# Patient Record
Sex: Female | Born: 1945 | Race: Black or African American | Hispanic: No | Smoking: Light tobacco smoker
Health system: Southern US, Community
[De-identification: ages and names within clinical notes are randomized; demographics above are authoritative.]

## PROBLEM LIST (undated history)

## (undated) DIAGNOSIS — E079 Disorder of thyroid, unspecified: Secondary | ICD-10-CM

## (undated) DIAGNOSIS — E064 Drug-induced thyroiditis: Secondary | ICD-10-CM

## (undated) DIAGNOSIS — Z9889 Other specified postprocedural states: Secondary | ICD-10-CM

## (undated) DIAGNOSIS — Z8719 Personal history of other diseases of the digestive system: Secondary | ICD-10-CM

## (undated) DIAGNOSIS — N19 Unspecified kidney failure: Secondary | ICD-10-CM

## (undated) DIAGNOSIS — H269 Unspecified cataract: Secondary | ICD-10-CM

## (undated) DIAGNOSIS — I509 Heart failure, unspecified: Secondary | ICD-10-CM

## (undated) DIAGNOSIS — I10 Essential (primary) hypertension: Secondary | ICD-10-CM

## (undated) DIAGNOSIS — IMO0001 Reserved for inherently not codable concepts without codable children: Secondary | ICD-10-CM

## (undated) DIAGNOSIS — I739 Peripheral vascular disease, unspecified: Secondary | ICD-10-CM

## (undated) DIAGNOSIS — T7840XA Allergy, unspecified, initial encounter: Secondary | ICD-10-CM

## (undated) DIAGNOSIS — G4730 Sleep apnea, unspecified: Secondary | ICD-10-CM

## (undated) DIAGNOSIS — I428 Other cardiomyopathies: Secondary | ICD-10-CM

## (undated) DIAGNOSIS — E890 Postprocedural hypothyroidism: Secondary | ICD-10-CM

## (undated) DIAGNOSIS — Z8639 Personal history of other endocrine, nutritional and metabolic disease: Secondary | ICD-10-CM

## (undated) DIAGNOSIS — M1990 Unspecified osteoarthritis, unspecified site: Secondary | ICD-10-CM

## (undated) DIAGNOSIS — Z923 Personal history of irradiation: Secondary | ICD-10-CM

## (undated) DIAGNOSIS — E119 Type 2 diabetes mellitus without complications: Secondary | ICD-10-CM

## (undated) DIAGNOSIS — K219 Gastro-esophageal reflux disease without esophagitis: Secondary | ICD-10-CM

## (undated) DIAGNOSIS — E785 Hyperlipidemia, unspecified: Secondary | ICD-10-CM

## (undated) DIAGNOSIS — Z95810 Presence of automatic (implantable) cardiac defibrillator: Secondary | ICD-10-CM

## (undated) HISTORY — PX: CATARACT EXTRACTION: SUR2

## (undated) HISTORY — PX: THYROIDECTOMY: SHX17

## (undated) HISTORY — DX: Essential (primary) hypertension: I10

## (undated) HISTORY — DX: Heart failure, unspecified: I50.9

## (undated) HISTORY — DX: Hyperlipidemia, unspecified: E78.5

## (undated) HISTORY — DX: Sleep apnea, unspecified: G47.30

## (undated) HISTORY — PX: TOTAL ABDOMINAL HYSTERECTOMY: SHX209

## (undated) HISTORY — DX: Personal history of other endocrine, nutritional and metabolic disease: Z86.39

## (undated) HISTORY — DX: Personal history of irradiation: Z92.3

## (undated) HISTORY — DX: Disorder of thyroid, unspecified: E07.9

## (undated) HISTORY — DX: Drug-induced thyroiditis: E06.4

## (undated) HISTORY — DX: Other specified postprocedural states: Z98.890

## (undated) HISTORY — DX: Unspecified osteoarthritis, unspecified site: M19.90

## (undated) HISTORY — DX: Gastro-esophageal reflux disease without esophagitis: K21.9

## (undated) HISTORY — PX: EYE SURGERY: SHX253

## (undated) HISTORY — DX: Peripheral vascular disease, unspecified: I73.9

## (undated) HISTORY — DX: Type 2 diabetes mellitus without complications: E11.9

## (undated) HISTORY — DX: Allergy, unspecified, initial encounter: T78.40XA

## (undated) HISTORY — DX: Unspecified kidney failure: N19

## (undated) HISTORY — DX: Postprocedural hypothyroidism: E89.0

## (undated) HISTORY — PX: TONSILLECTOMY: SHX5217

## (undated) HISTORY — DX: Other cardiomyopathies: I42.8

## (undated) HISTORY — DX: Unspecified cataract: H26.9

---

## 1997-10-29 ENCOUNTER — Ambulatory Visit (HOSPITAL_COMMUNITY): Admission: RE | Admit: 1997-10-29 | Discharge: 1997-10-29 | Payer: Self-pay | Admitting: Podiatry

## 1997-11-02 ENCOUNTER — Ambulatory Visit (HOSPITAL_COMMUNITY): Admission: RE | Admit: 1997-11-02 | Discharge: 1997-11-02 | Payer: Self-pay | Admitting: Podiatry

## 1997-11-04 ENCOUNTER — Other Ambulatory Visit: Admission: RE | Admit: 1997-11-04 | Discharge: 1997-11-04 | Payer: Self-pay | Admitting: Podiatry

## 1998-05-13 ENCOUNTER — Ambulatory Visit (HOSPITAL_COMMUNITY): Admission: RE | Admit: 1998-05-13 | Discharge: 1998-05-13 | Payer: Self-pay | Admitting: Endocrinology

## 1998-05-13 ENCOUNTER — Encounter: Payer: Self-pay | Admitting: Endocrinology

## 1998-07-09 ENCOUNTER — Emergency Department (HOSPITAL_COMMUNITY): Admission: EM | Admit: 1998-07-09 | Discharge: 1998-07-09 | Payer: Self-pay | Admitting: Emergency Medicine

## 1998-07-09 ENCOUNTER — Encounter: Payer: Self-pay | Admitting: Emergency Medicine

## 1999-05-16 ENCOUNTER — Ambulatory Visit (HOSPITAL_COMMUNITY): Admission: RE | Admit: 1999-05-16 | Discharge: 1999-05-16 | Payer: Self-pay | Admitting: Endocrinology

## 1999-05-16 ENCOUNTER — Encounter: Payer: Self-pay | Admitting: Endocrinology

## 1999-08-16 ENCOUNTER — Emergency Department (HOSPITAL_COMMUNITY): Admission: EM | Admit: 1999-08-16 | Discharge: 1999-08-16 | Payer: Self-pay | Admitting: Emergency Medicine

## 1999-10-10 ENCOUNTER — Ambulatory Visit (HOSPITAL_COMMUNITY): Admission: RE | Admit: 1999-10-10 | Discharge: 1999-10-10 | Payer: Self-pay | Admitting: Internal Medicine

## 1999-10-10 ENCOUNTER — Encounter: Payer: Self-pay | Admitting: Internal Medicine

## 1999-10-11 ENCOUNTER — Encounter: Payer: Self-pay | Admitting: Internal Medicine

## 1999-10-19 ENCOUNTER — Encounter (INDEPENDENT_AMBULATORY_CARE_PROVIDER_SITE_OTHER): Payer: Self-pay | Admitting: Specialist

## 1999-10-19 ENCOUNTER — Ambulatory Visit (HOSPITAL_COMMUNITY): Admission: RE | Admit: 1999-10-19 | Discharge: 1999-10-19 | Payer: Self-pay | Admitting: Internal Medicine

## 1999-10-19 ENCOUNTER — Encounter: Payer: Self-pay | Admitting: Internal Medicine

## 2000-07-04 ENCOUNTER — Encounter: Payer: Self-pay | Admitting: Endocrinology

## 2000-07-04 ENCOUNTER — Ambulatory Visit (HOSPITAL_COMMUNITY): Admission: RE | Admit: 2000-07-04 | Discharge: 2000-07-04 | Payer: Self-pay | Admitting: Endocrinology

## 2000-07-05 ENCOUNTER — Encounter: Admission: RE | Admit: 2000-07-05 | Discharge: 2000-07-05 | Payer: Self-pay | Admitting: Internal Medicine

## 2000-07-05 ENCOUNTER — Encounter: Payer: Self-pay | Admitting: Internal Medicine

## 2001-03-16 ENCOUNTER — Emergency Department (HOSPITAL_COMMUNITY): Admission: EM | Admit: 2001-03-16 | Discharge: 2001-03-16 | Payer: Self-pay | Admitting: Emergency Medicine

## 2001-03-20 ENCOUNTER — Ambulatory Visit (HOSPITAL_COMMUNITY): Admission: EM | Admit: 2001-03-20 | Discharge: 2001-03-21 | Payer: Self-pay | Admitting: Emergency Medicine

## 2001-03-20 ENCOUNTER — Encounter: Payer: Self-pay | Admitting: *Deleted

## 2001-07-15 ENCOUNTER — Encounter (INDEPENDENT_AMBULATORY_CARE_PROVIDER_SITE_OTHER): Payer: Self-pay | Admitting: Specialist

## 2001-07-15 ENCOUNTER — Ambulatory Visit (HOSPITAL_COMMUNITY): Admission: RE | Admit: 2001-07-15 | Discharge: 2001-07-15 | Payer: Self-pay | Admitting: Gastroenterology

## 2001-07-17 ENCOUNTER — Encounter: Admission: RE | Admit: 2001-07-17 | Discharge: 2001-07-17 | Payer: Self-pay | Admitting: Internal Medicine

## 2001-07-17 ENCOUNTER — Encounter: Payer: Self-pay | Admitting: Internal Medicine

## 2001-08-08 ENCOUNTER — Emergency Department (HOSPITAL_COMMUNITY): Admission: EM | Admit: 2001-08-08 | Discharge: 2001-08-08 | Payer: Self-pay | Admitting: Emergency Medicine

## 2002-01-08 ENCOUNTER — Encounter: Payer: Self-pay | Admitting: Internal Medicine

## 2002-01-08 ENCOUNTER — Ambulatory Visit (HOSPITAL_COMMUNITY): Admission: RE | Admit: 2002-01-08 | Discharge: 2002-01-08 | Payer: Self-pay | Admitting: Endocrinology

## 2002-07-16 ENCOUNTER — Encounter: Payer: Self-pay | Admitting: Internal Medicine

## 2002-07-16 ENCOUNTER — Encounter (HOSPITAL_COMMUNITY): Admission: RE | Admit: 2002-07-16 | Discharge: 2002-10-14 | Payer: Self-pay | Admitting: Internal Medicine

## 2002-08-31 ENCOUNTER — Emergency Department (HOSPITAL_COMMUNITY): Admission: EM | Admit: 2002-08-31 | Discharge: 2002-08-31 | Payer: Self-pay | Admitting: Emergency Medicine

## 2003-07-04 ENCOUNTER — Emergency Department (HOSPITAL_COMMUNITY): Admission: EM | Admit: 2003-07-04 | Discharge: 2003-07-04 | Payer: Self-pay | Admitting: Emergency Medicine

## 2003-07-22 ENCOUNTER — Encounter (HOSPITAL_COMMUNITY): Admission: RE | Admit: 2003-07-22 | Discharge: 2003-10-20 | Payer: Self-pay | Admitting: Internal Medicine

## 2003-09-01 ENCOUNTER — Encounter: Admission: RE | Admit: 2003-09-01 | Discharge: 2003-09-01 | Payer: Self-pay | Admitting: Internal Medicine

## 2004-07-14 ENCOUNTER — Ambulatory Visit (HOSPITAL_COMMUNITY): Admission: RE | Admit: 2004-07-14 | Discharge: 2004-07-14 | Payer: Self-pay | Admitting: Internal Medicine

## 2005-04-10 ENCOUNTER — Encounter: Payer: Self-pay | Admitting: Internal Medicine

## 2005-05-04 ENCOUNTER — Encounter: Admission: RE | Admit: 2005-05-04 | Discharge: 2005-05-04 | Payer: Self-pay | Admitting: Internal Medicine

## 2006-05-28 LAB — CONVERTED CEMR LAB: Pap Smear: NORMAL

## 2007-01-28 ENCOUNTER — Emergency Department (HOSPITAL_COMMUNITY): Admission: EM | Admit: 2007-01-28 | Discharge: 2007-01-28 | Payer: Self-pay | Admitting: Emergency Medicine

## 2007-01-31 ENCOUNTER — Encounter: Payer: Self-pay | Admitting: Internal Medicine

## 2007-01-31 ENCOUNTER — Ambulatory Visit: Payer: Self-pay | Admitting: Internal Medicine

## 2007-01-31 DIAGNOSIS — K209 Esophagitis, unspecified without bleeding: Secondary | ICD-10-CM | POA: Insufficient documentation

## 2007-01-31 DIAGNOSIS — I319 Disease of pericardium, unspecified: Secondary | ICD-10-CM | POA: Insufficient documentation

## 2007-01-31 DIAGNOSIS — I10 Essential (primary) hypertension: Secondary | ICD-10-CM | POA: Insufficient documentation

## 2007-01-31 DIAGNOSIS — E039 Hypothyroidism, unspecified: Secondary | ICD-10-CM | POA: Insufficient documentation

## 2007-02-01 ENCOUNTER — Encounter: Payer: Self-pay | Admitting: Internal Medicine

## 2007-02-01 ENCOUNTER — Ambulatory Visit: Payer: Self-pay | Admitting: Internal Medicine

## 2007-02-01 ENCOUNTER — Ambulatory Visit: Payer: Self-pay

## 2007-02-04 ENCOUNTER — Encounter: Payer: Self-pay | Admitting: Internal Medicine

## 2007-02-06 ENCOUNTER — Ambulatory Visit: Payer: Self-pay | Admitting: Cardiology

## 2007-02-14 ENCOUNTER — Ambulatory Visit: Payer: Self-pay

## 2007-02-14 ENCOUNTER — Encounter: Payer: Self-pay | Admitting: Internal Medicine

## 2007-02-22 ENCOUNTER — Telehealth: Payer: Self-pay | Admitting: Internal Medicine

## 2007-03-11 ENCOUNTER — Encounter: Payer: Self-pay | Admitting: Internal Medicine

## 2007-03-12 ENCOUNTER — Ambulatory Visit: Payer: Self-pay | Admitting: Internal Medicine

## 2007-03-12 DIAGNOSIS — I428 Other cardiomyopathies: Secondary | ICD-10-CM | POA: Insufficient documentation

## 2007-03-15 LAB — CONVERTED CEMR LAB
BUN: 17 mg/dL (ref 6–23)
Chloride: 102 meq/L (ref 96–112)
Creatinine, Ser: 1.3 mg/dL — ABNORMAL HIGH (ref 0.4–1.2)
Sodium: 137 meq/L (ref 135–145)
TSH: 66.44 microintl units/mL — ABNORMAL HIGH (ref 0.35–5.50)
Vit D, 1,25-Dihydroxy: 14 — ABNORMAL LOW (ref 30–89)

## 2007-03-19 ENCOUNTER — Ambulatory Visit: Payer: Self-pay | Admitting: Cardiology

## 2007-04-10 ENCOUNTER — Ambulatory Visit: Payer: Self-pay

## 2007-04-10 ENCOUNTER — Encounter: Payer: Self-pay | Admitting: Cardiology

## 2007-05-05 DIAGNOSIS — Z9889 Other specified postprocedural states: Secondary | ICD-10-CM

## 2007-05-05 HISTORY — DX: Other specified postprocedural states: Z98.890

## 2007-05-07 ENCOUNTER — Ambulatory Visit: Payer: Self-pay | Admitting: Internal Medicine

## 2007-05-07 LAB — CONVERTED CEMR LAB
AST: 14 units/L (ref 0–37)
Albumin: 3.5 g/dL (ref 3.5–5.2)
Alkaline Phosphatase: 85 units/L (ref 39–117)
BUN: 15 mg/dL (ref 6–23)
Chloride: 102 meq/L (ref 96–112)
Direct LDL: 164.1 mg/dL
GFR calc non Af Amer: 53 mL/min
Potassium: 3.9 meq/L (ref 3.5–5.1)
Sodium: 138 meq/L (ref 135–145)
TSH: 24.71 microintl units/mL — ABNORMAL HIGH (ref 0.35–5.50)
Total CHOL/HDL Ratio: 4.7
VLDL: 23 mg/dL (ref 0–40)

## 2007-05-08 ENCOUNTER — Ambulatory Visit: Payer: Self-pay | Admitting: Cardiology

## 2007-05-09 ENCOUNTER — Ambulatory Visit: Payer: Self-pay | Admitting: Cardiology

## 2007-05-09 LAB — CONVERTED CEMR LAB
Basophils Relative: 0.7 % (ref 0.0–1.0)
Eosinophils Absolute: 0 10*3/uL (ref 0.0–0.6)
Eosinophils Relative: 0.6 % (ref 0.0–5.0)
Hemoglobin: 12.1 g/dL (ref 12.0–15.0)
Lymphocytes Relative: 33.3 % (ref 12.0–46.0)
MCV: 91 fL (ref 78.0–100.0)
Monocytes Absolute: 0.4 10*3/uL (ref 0.2–0.7)
Neutro Abs: 4.4 10*3/uL (ref 1.4–7.7)
Neutrophils Relative %: 59.5 % (ref 43.0–77.0)
Platelets: 382 10*3/uL (ref 150–400)
Prothrombin Time: 11.1 s (ref 10.9–13.3)
WBC: 7.3 10*3/uL (ref 4.5–10.5)

## 2007-05-13 ENCOUNTER — Inpatient Hospital Stay (HOSPITAL_BASED_OUTPATIENT_CLINIC_OR_DEPARTMENT_OTHER): Admission: RE | Admit: 2007-05-13 | Discharge: 2007-05-13 | Payer: Self-pay | Admitting: Cardiology

## 2007-05-13 ENCOUNTER — Encounter: Payer: Self-pay | Admitting: Internal Medicine

## 2007-05-13 ENCOUNTER — Ambulatory Visit: Payer: Self-pay | Admitting: Cardiology

## 2007-05-16 ENCOUNTER — Ambulatory Visit: Payer: Self-pay | Admitting: Internal Medicine

## 2007-05-16 DIAGNOSIS — E785 Hyperlipidemia, unspecified: Secondary | ICD-10-CM

## 2007-05-23 ENCOUNTER — Ambulatory Visit: Payer: Self-pay | Admitting: Cardiology

## 2007-09-02 ENCOUNTER — Ambulatory Visit: Payer: Self-pay | Admitting: Internal Medicine

## 2007-09-02 LAB — CONVERTED CEMR LAB
Cholesterol: 208 mg/dL (ref 0–200)
Total CHOL/HDL Ratio: 4.1
Triglycerides: 145 mg/dL (ref 0–149)
VLDL: 29 mg/dL (ref 0–40)

## 2007-09-05 ENCOUNTER — Ambulatory Visit: Payer: Self-pay | Admitting: Internal Medicine

## 2007-09-05 DIAGNOSIS — M67919 Unspecified disorder of synovium and tendon, unspecified shoulder: Secondary | ICD-10-CM | POA: Insufficient documentation

## 2007-09-05 DIAGNOSIS — M719 Bursopathy, unspecified: Secondary | ICD-10-CM

## 2007-11-20 ENCOUNTER — Telehealth: Payer: Self-pay | Admitting: Internal Medicine

## 2007-11-20 ENCOUNTER — Ambulatory Visit: Payer: Self-pay | Admitting: Internal Medicine

## 2007-11-26 ENCOUNTER — Encounter: Admission: RE | Admit: 2007-11-26 | Discharge: 2007-11-26 | Payer: Self-pay | Admitting: Internal Medicine

## 2007-11-28 ENCOUNTER — Ambulatory Visit: Payer: Self-pay | Admitting: Internal Medicine

## 2008-02-18 ENCOUNTER — Ambulatory Visit: Payer: Self-pay | Admitting: Internal Medicine

## 2008-02-18 LAB — CONVERTED CEMR LAB
AST: 15 units/L (ref 0–37)
CO2: 24 meq/L (ref 19–32)
Calcium: 9.4 mg/dL (ref 8.4–10.5)
Creatinine, Ser: 1.1 mg/dL (ref 0.4–1.2)
GFR calc non Af Amer: 53 mL/min
HDL: 60.2 mg/dL (ref >39.0)
Total CHOL/HDL Ratio: 3.6
Triglycerides: 86 mg/dL (ref 0–149)
VLDL: 17 mg/dL (ref 0–40)

## 2008-02-24 ENCOUNTER — Ambulatory Visit: Payer: Self-pay | Admitting: Internal Medicine

## 2008-03-10 ENCOUNTER — Ambulatory Visit: Payer: Self-pay | Admitting: Internal Medicine

## 2008-03-24 ENCOUNTER — Telehealth: Payer: Self-pay | Admitting: Internal Medicine

## 2008-06-01 ENCOUNTER — Telehealth (INDEPENDENT_AMBULATORY_CARE_PROVIDER_SITE_OTHER): Payer: Self-pay | Admitting: *Deleted

## 2008-09-28 ENCOUNTER — Telehealth: Payer: Self-pay | Admitting: Internal Medicine

## 2008-10-22 ENCOUNTER — Ambulatory Visit: Payer: Self-pay | Admitting: Internal Medicine

## 2008-10-22 LAB — CONVERTED CEMR LAB
Cholesterol: 203 mg/dL — ABNORMAL HIGH (ref 0–200)
Direct LDL: 122.6 mg/dL
Total CHOL/HDL Ratio: 4
Triglycerides: 133 mg/dL (ref 0.0–149.0)
VLDL: 26.6 mg/dL (ref 0.0–40.0)

## 2008-10-29 ENCOUNTER — Ambulatory Visit: Payer: Self-pay | Admitting: Internal Medicine

## 2008-10-29 DIAGNOSIS — F17200 Nicotine dependence, unspecified, uncomplicated: Secondary | ICD-10-CM

## 2008-10-29 DIAGNOSIS — E119 Type 2 diabetes mellitus without complications: Secondary | ICD-10-CM

## 2008-10-29 DIAGNOSIS — R252 Cramp and spasm: Secondary | ICD-10-CM

## 2008-10-29 LAB — CONVERTED CEMR LAB
HDL goal, serum: 40 mg/dL
LDL Goal: 70 mg/dL

## 2008-11-04 ENCOUNTER — Ambulatory Visit: Payer: Self-pay

## 2008-11-04 ENCOUNTER — Encounter: Payer: Self-pay | Admitting: Internal Medicine

## 2008-11-05 ENCOUNTER — Telehealth: Payer: Self-pay | Admitting: Internal Medicine

## 2008-11-17 ENCOUNTER — Encounter: Payer: Self-pay | Admitting: Internal Medicine

## 2008-11-20 ENCOUNTER — Encounter: Payer: Self-pay | Admitting: Internal Medicine

## 2008-11-23 ENCOUNTER — Telehealth: Payer: Self-pay | Admitting: Internal Medicine

## 2008-11-24 ENCOUNTER — Encounter: Payer: Self-pay | Admitting: Internal Medicine

## 2008-11-30 ENCOUNTER — Telehealth: Payer: Self-pay | Admitting: Internal Medicine

## 2008-12-04 ENCOUNTER — Ambulatory Visit: Payer: Self-pay | Admitting: Cardiovascular Disease

## 2008-12-04 ENCOUNTER — Encounter: Payer: Self-pay | Admitting: Internal Medicine

## 2008-12-07 DIAGNOSIS — I70219 Atherosclerosis of native arteries of extremities with intermittent claudication, unspecified extremity: Secondary | ICD-10-CM

## 2008-12-21 ENCOUNTER — Telehealth: Payer: Self-pay | Admitting: Internal Medicine

## 2008-12-21 ENCOUNTER — Telehealth: Payer: Self-pay | Admitting: Cardiovascular Disease

## 2008-12-31 ENCOUNTER — Ambulatory Visit: Payer: Self-pay | Admitting: Internal Medicine

## 2009-03-05 ENCOUNTER — Telehealth: Payer: Self-pay | Admitting: Internal Medicine

## 2009-03-19 ENCOUNTER — Ambulatory Visit: Payer: Self-pay | Admitting: Cardiovascular Disease

## 2009-03-19 DIAGNOSIS — I5022 Chronic systolic (congestive) heart failure: Secondary | ICD-10-CM

## 2009-04-07 ENCOUNTER — Ambulatory Visit: Payer: Self-pay | Admitting: Internal Medicine

## 2009-04-07 LAB — CONVERTED CEMR LAB
ALT: 14 units/L (ref 0–35)
Calcium: 9.8 mg/dL (ref 8.4–10.5)
Direct LDL: 188.2 mg/dL
GFR calc non Af Amer: 63.97 mL/min (ref >60)
Glucose, Bld: 109 mg/dL — ABNORMAL HIGH (ref 70–99)
HDL: 51 mg/dL (ref >39.00)
Sodium: 142 meq/L (ref 135–145)

## 2009-04-12 ENCOUNTER — Ambulatory Visit: Payer: Self-pay | Admitting: Internal Medicine

## 2009-04-22 ENCOUNTER — Ambulatory Visit: Payer: Self-pay

## 2009-04-22 ENCOUNTER — Encounter: Payer: Self-pay | Admitting: Cardiovascular Disease

## 2009-04-22 ENCOUNTER — Ambulatory Visit (HOSPITAL_COMMUNITY): Admission: RE | Admit: 2009-04-22 | Discharge: 2009-04-22 | Payer: Self-pay | Admitting: Cardiovascular Disease

## 2009-04-22 ENCOUNTER — Ambulatory Visit: Payer: Self-pay | Admitting: Cardiovascular Disease

## 2009-07-26 ENCOUNTER — Ambulatory Visit: Payer: Self-pay | Admitting: Internal Medicine

## 2009-07-26 LAB — CONVERTED CEMR LAB
BUN: 12 mg/dL (ref 6–23)
Calcium: 9.4 mg/dL (ref 8.4–10.5)
Chloride: 102 meq/L (ref 96–112)
Creatinine, Ser: 1.1 mg/dL (ref 0.4–1.2)
Direct LDL: 122.9 mg/dL
GFR calc non Af Amer: 63.91 mL/min (ref >60)
HDL: 57.5 mg/dL (ref >39.00)
Hgb A1c MFr Bld: 6.9 % — ABNORMAL HIGH (ref 4.6–6.5)
Potassium: 4 meq/L (ref 3.5–5.1)
Sodium: 141 meq/L (ref 135–145)
TSH: 9.03 microintl units/mL — ABNORMAL HIGH (ref 0.35–5.50)

## 2009-08-02 ENCOUNTER — Encounter: Payer: Self-pay | Admitting: Cardiovascular Disease

## 2009-08-02 ENCOUNTER — Ambulatory Visit: Payer: Self-pay | Admitting: Internal Medicine

## 2009-09-07 ENCOUNTER — Encounter: Payer: Self-pay | Admitting: Internal Medicine

## 2009-10-15 ENCOUNTER — Ambulatory Visit: Payer: Self-pay | Admitting: Internal Medicine

## 2009-11-01 ENCOUNTER — Ambulatory Visit: Payer: Self-pay | Admitting: Internal Medicine

## 2009-11-23 ENCOUNTER — Ambulatory Visit: Payer: Self-pay | Admitting: Cardiovascular Disease

## 2010-01-03 ENCOUNTER — Ambulatory Visit (HOSPITAL_BASED_OUTPATIENT_CLINIC_OR_DEPARTMENT_OTHER): Admission: RE | Admit: 2010-01-03 | Discharge: 2010-01-03 | Payer: Self-pay | Admitting: Internal Medicine

## 2010-01-03 ENCOUNTER — Ambulatory Visit: Payer: Self-pay | Admitting: Internal Medicine

## 2010-01-03 ENCOUNTER — Ambulatory Visit: Payer: Self-pay | Admitting: Diagnostic Radiology

## 2010-01-03 DIAGNOSIS — R059 Cough, unspecified: Secondary | ICD-10-CM | POA: Insufficient documentation

## 2010-01-03 DIAGNOSIS — R05 Cough: Secondary | ICD-10-CM

## 2010-01-04 ENCOUNTER — Encounter: Payer: Self-pay | Admitting: Internal Medicine

## 2010-01-07 ENCOUNTER — Ambulatory Visit (HOSPITAL_BASED_OUTPATIENT_CLINIC_OR_DEPARTMENT_OTHER): Admission: RE | Admit: 2010-01-07 | Discharge: 2010-01-07 | Payer: Self-pay | Admitting: Internal Medicine

## 2010-01-07 ENCOUNTER — Ambulatory Visit: Payer: Self-pay | Admitting: Diagnostic Radiology

## 2010-01-11 ENCOUNTER — Telehealth: Payer: Self-pay | Admitting: Internal Medicine

## 2010-01-12 ENCOUNTER — Encounter: Payer: Self-pay | Admitting: Internal Medicine

## 2010-03-14 ENCOUNTER — Telehealth: Payer: Self-pay | Admitting: Internal Medicine

## 2010-04-20 ENCOUNTER — Encounter: Payer: Self-pay | Admitting: Internal Medicine

## 2010-04-20 ENCOUNTER — Ambulatory Visit
Admission: RE | Admit: 2010-04-20 | Discharge: 2010-04-20 | Payer: Self-pay | Source: Home / Self Care | Attending: Internal Medicine | Admitting: Internal Medicine

## 2010-04-20 DIAGNOSIS — E1142 Type 2 diabetes mellitus with diabetic polyneuropathy: Secondary | ICD-10-CM | POA: Insufficient documentation

## 2010-04-20 LAB — CONVERTED CEMR LAB
Albumin: 4.3 g/dL (ref 3.5–5.2)
BUN: 17 mg/dL (ref 6–23)
CO2: 26 meq/L (ref 19–32)
Chloride: 103 meq/L (ref 96–112)
Creatinine, Ser: 0.99 mg/dL (ref 0.40–1.20)
Creatinine, Urine: 85.3 mg/dL
Hgb A1c MFr Bld: 7.3 % — ABNORMAL HIGH (ref <5.7)
Indirect Bilirubin: 0.5 mg/dL (ref 0.0–0.9)
LDL Cholesterol: 137 mg/dL — ABNORMAL HIGH (ref 0–99)
Potassium: 4.2 meq/L (ref 3.5–5.3)
Total Bilirubin: 0.6 mg/dL (ref 0.3–1.2)
Total Protein: 7.8 g/dL (ref 6.0–8.3)
Triglycerides: 152 mg/dL — ABNORMAL HIGH (ref <150)
VLDL: 30 mg/dL (ref 0–40)

## 2010-04-22 ENCOUNTER — Telehealth: Payer: Self-pay | Admitting: Internal Medicine

## 2010-04-23 ENCOUNTER — Encounter: Payer: Self-pay | Admitting: Internal Medicine

## 2010-05-05 NOTE — Medication Information (Signed)
Summary: Low Adherence with Cholesterol Med/[facility]  Low Adherence with Cholesterol Med/[facility]   Imported By: Edmonia James 03/15/2010 12:58:31  _____________________________________________________________________  External Attachment:    Type:   Image     Comment:   External Document

## 2010-05-05 NOTE — Medication Information (Signed)
Summary: Letter Regarding Polypharmacy in Seniors & Cil[facility]  Letter Regarding Polypharmacy in [facility]   Imported By: Edmonia James 10/21/2009 10:16:59  _____________________________________________________________________  External Attachment:    Type:   Image     Comment:   External Document

## 2010-05-05 NOTE — Progress Notes (Signed)
Summary: Lab Results  Phone Note Outgoing Call   Summary of Call: call pt - recent blood test shows pt severely hypothyroid.  has she been taking her thyroid medication regularly  (plz call her pharm to find out when thyroid medication last refilled) Initial call taken by: D. [name] DO,  January 11, 2010 1:56 PM  Follow-up for Phone Call        call placed to [facility] with pharmacist  Mortimer Fries, he states patients last refill was 09/21/2009, and prior to that she had a 19 lapse in refills. He states the medication has been filled sporadically.  call placed to patient at [phone], patient states that she has been taking her medication on a regular basis. Follow-up by: Jiles Garter CMA,  January 11, 2010 2:34 PM  Additional Follow-up for Phone Call Additional follow up Details #1::        call pt - It is very important she take her thyroid medication regularly Blood tests shows she has been w/o medication for a whille when she restarts thyroid medication. pt needs to restart taking 1/2 of 125 mg x 2 weeks,  then 1 tab x 2 weeks, then 1.5 tabs x 2 weeks,  then 2 tabs by mouth qd Additional Follow-up by: D. [name] DO,  January 12, 2010 11:44 AM    Additional Follow-up for Phone Call Additional follow up Details #2::    call returned to patient she has been advised per Dr Shawna Orleans instructions. She stated she needed a new rx sent to pharmacy.  She was informed rx will be sent.  Follow-up by: Jiles Garter CMA,  January 12, 2010 12:03 PM  Prescriptions: LEVOTHYROXINE SODIUM 125 MCG TABS (LEVOTHYROXINE SODIUM) 2 tabs by mouth qd  #60 x 2   Entered by:   Jiles Garter CMA   Authorized by:   D. [name] DO   Signed by:   Jiles Garter CMA on 01/12/2010   Method used:   Electronically to        [facility] (retail)       [place], [place]  [id number]       Ph: [phone]       Fax: [phone]   RxID:   [id number]

## 2010-05-05 NOTE — Assessment & Plan Note (Signed)
Summary: COLD/COUGH/HX OF PNEUMONIA/HEA   Vital Signs:  Patient profile:   65 year old female Height:      71 inches Weight:      227.25 pounds BMI:     31.81 O2 Sat:      98 % on Room air Temp:     97.5 degrees F oral Pulse rate:   63 / minute Pulse rhythm:   irregular Resp:     14 per minute BP sitting:   140 / 90  (left arm) Cuff size:   large  Vitals Entered By: Jiles Garter CMA (January 03, 2010 11:25 AM)  O2 Flow:  Room air CC: URI Is Patient Diabetic? No Pain Assessment Patient in pain? no      Comments onset week ago, head congestion,  throat irritation, ache in arms and legs, chest congestion, productive cough yellow in color, diarrhea after anything she eats, she denies temperature, but has been sweating, increase in  appetite   Primary Care Provider:  Jennings Books DO  CC:  URI.  History of Present Illness: 65 y/o female c/o URI symptoms started with sneezing, sore throat onset 2 weeks coughing with yellowish sputum (settling in her throat) feeling some SOB and achy   Allergies: 1)  ! 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  The patient denies dyspnea on exertion.    Physical Exam  General:  alert, well-developed, and well-nourished.   Head:  normocephalic and atraumatic.   Ears:  R ear normal and L ear normal.   Mouth:  pharynx pink and moist.   Neck:  supple and no masses.   no carotid bruits.   Lungs:  normal respiratory effort and faint scattered exp wheeze Heart:  normal rate, regular rhythm, and no gallop.   Extremities:  trace left pedal edema and trace right pedal edema.   Psych:  normally interactive, good eye contact, not anxious appearing, and not depressed appearing.     Impression & Recommendations:  Problem # 1:  COUGH (ICD-786.2) pt with bronchitis.  cxr neg for acute airspace dz take abx as directed. Patient advised to call office if symptoms persist or worsen.  Orders: T-2 View CXR, Same Day ([id number])  Problem # 2:  DIABETES MELLITUS, TYPE II (ICD-250.00) Assessment: Unchanged Pt counseled on diet and exercise.  The following medications were removed from the medication list:    Lisinopril-hydrochlorothiazide 20-12.5 Mg Tabs (Lisinopril-hydrochlorothiazide) .Marland Kitchen... Take 1 tablet by mouth once a day Her updated medication list for this problem includes:    [facility] Aspirin 81 Mg Tbec (Aspirin) .Marland Kitchen... Take 1 tablet by mouth once a day    Metformin Hcl 500 Mg Tabs (Metformin hcl) ..... One by mouth two times a day    Losartan Potassium-hctz 100-25 Mg Tabs (Losartan potassium-hctz) ..... One by mouth once daily  Orders: T- Hemoglobin A1C [id number]) T-Urine  Microalbumin w/creat. ratio [id number])  Labs Reviewed: Creat: 1.1 (07/26/2009)    Reviewed HgBA1c results: 6.9 (07/26/2009)  6.6 (04/07/2009)  Problem # 3:  HYPERTENSION (ICD-401.9) Assessment: Deteriorated change ACE to ARB.  The following medications were removed from the medication list:    Lisinopril-hydrochlorothiazide 20-12.5 Mg Tabs (Lisinopril-hydrochlorothiazide) .Marland Kitchen... Take 1 tablet by mouth once a day Her updated medication list for this problem includes:    Carvedilol 25 Mg Tabs (Carvedilol) .Marland Kitchen... Take one tablet by mouth twice a day    Amlodipine Besylate 10 Mg Tabs (Amlodipine besylate) ..... One by mouth once daily    Losartan Potassium-hctz 100-25 Mg  Tabs (Losartan potassium-hctz) ..... One by mouth once daily  Orders: T-Basic Metabolic Panel (99991111)  BP today: 140/90 Prior BP: 124/70 (11/23/2009)  Prior 10 Yr Risk Heart Disease: Not enough information (10/29/2008)  Labs Reviewed: K+: 4.0 (07/26/2009) Creat: : 1.1 (07/26/2009)   Chol: 210 (07/26/2009)   HDL: 57.50 (07/26/2009)   LDL: DEL (02/18/2008)   TG: 139.0 (07/26/2009)  Problem # 4:  HYPOTHYROIDISM (ICD-244.9) monitor TFTs Her updated medication list for this problem includes:    Levothyroxine Sodium 125 Mcg Tabs (Levothyroxine sodium) .Marland Kitchen... 2 tabs by mouth qd  Orders: T-TSH [id number])  Labs Reviewed: TSH: 9.03 (07/26/2009)    HgBA1c: 6.9 (07/26/2009) Chol: 210 (07/26/2009)   HDL: 57.50 (07/26/2009)   LDL: DEL (02/18/2008)   TG: 139.0 (07/26/2009)  Complete Medication List: 1)  Levothyroxine Sodium 125 Mcg Tabs (Levothyroxine sodium) .... 2 tabs by mouth qd 2)  Carvedilol 25 Mg Tabs (Carvedilol) .... Take one tablet by mouth twice a day 3)  [facility] Joseph Aspirin 81 Mg Tbec (Aspirin) .... Take 1 tablet by mouth once a day 4)  Simvastatin 20 Mg Tabs (Simvastatin) .... Take one tablet by mouth daily at bedtime 5)  Amlodipine Besylate 10 Mg Tabs (Amlodipine besylate) .... One by mouth once daily 6)  Centrum Silver Tabs (Multiple vitamins-minerals) .Marland Kitchen.. 1 by mouth qd 7)  Metformin Hcl 500 Mg Tabs (Metformin hcl) .... One by mouth two times a day 8)  Cefuroxime Axetil 500 Mg Tabs (Cefuroxime axetil) .... One by mouth two times a day 9)  Azithromycin 250 Mg Tabs (Azithromycin) .... Two tabs on day one, then one by mouth once daily x 4 days 10)  Losartan Potassium-hctz 100-25 Mg Tabs (Losartan potassium-hctz) .... One by mouth once daily 11)  Benzonatate 100 Mg Caps (Benzonatate) .... One by mouth three times a day as needed for cough  Patient Instructions: 1)  Stop Lisinopril / Hctz 2)  Finish full course of antibiotics 3)  Please schedule a follow-up appointment in  1 month. Prescriptions: BENZONATATE 100 MG CAPS (BENZONATATE) one by mouth three times a day as needed for cough  #30 x 0   Entered and Authorized by:   D. [name] DO   Signed by:   D. [name] DO on 01/03/2010   Method used:   Electronically to        [facility] (retail)       [place], [place]  [id number]       Ph: [phone]       Fax: [phone]   RxID:   [id number] LOSARTAN POTASSIUM-HCTZ 100-25 MG TABS (LOSARTAN POTASSIUM-HCTZ) one by mouth once daily  #30 x 2   Entered and Authorized by:   D. [name] DO   Signed by:   D. [name] DO  on 01/03/2010   Method used:   Electronically to        [facility] (retail)       [place], [place]  [id number]       Ph: [phone]       Fax: [phone]   RxID:   [id number] AZITHROMYCIN 250 MG TABS (AZITHROMYCIN) two tabs on day one, then one by mouth once daily x 4 days  #6 x 0   Entered and Authorized by:   D. [name] DO   Signed by:   D. [name] DO on 01/03/2010   Method used:   Electronically to        [facility] (retail)       [place], [place]  [id number]       Ph: [phone]       Fax: [phone]   RxID:   [id number] CEFUROXIME AXETIL 500 MG TABS (CEFUROXIME AXETIL) one by mouth two times a day  #20 x 0   Entered and Authorized by:   D. [name] DO   Signed by:   D. [name] DO on 01/03/2010   Method used:   Electronically to        [facility] (retail)       [place], [place]  [id number]       Ph: [phone]       Fax: [phone]   RxID:   [id number]   Current Allergies (reviewed today): ! CODEINE

## 2010-05-05 NOTE — Progress Notes (Signed)
Summary: Lab Results & med changes  Phone Note Outgoing Call   Summary of Call: call pt - recent  blood test shows pt needs to take lower dose of thyroid medication.  see rx change.  repeat TSH with next lab draw also her diabetes is worse.  I suggest pt try to take higher dose of metformin.  see rx change please confirm pt taknig her simvastatin (cholesterol medication regularly)  needs bmet, TSH, A1c in 3 months then OV Initial call taken by: D. [name] DO,  April 22, 2010 9:56 AM  Follow-up for Phone Call        call placed to patient at [phone], she has been advised per Dr Shawna Orleans instructions. She has scheduled follow up for 4/23 and lab order entered for [facility]. Patient states that she has been taking he cholesterol medication on a regular basis Follow-up by: Jiles Garter CMA,  April 22, 2010 5:04 PM  Additional Follow-up for Phone Call Additional follow up Details #1::        then I suggest with change simvastatin to crestor.  see rx add FLP, AST, ALT to next lab draw Additional Follow-up by: D. [name] DO,  April 23, 2010 5:44 AM    Additional Follow-up for Phone Call Additional follow up Details #2::    call returned to patient at [phone], she has been informed per Dr Shawna Orleans instructions. Samples of Crestor left at front desk for patient pick up  Follow-up by: Jiles Garter CMA,  April 25, 2010 8:15 AM  New/Updated Medications: LEVOTHYROXINE SODIUM 112 MCG TABS (LEVOTHYROXINE SODIUM) 2 tabs by mouth once daily CRESTOR 20 MG TABS (ROSUVASTATIN CALCIUM) one by mouth once daily METFORMIN HCL 500 MG TABS (METFORMIN HCL) one and 1/2 by mouth two times a day Prescriptions: CRESTOR 20 MG TABS (ROSUVASTATIN CALCIUM) one by mouth once daily  #30 x 2   Entered and Authorized by:   D. [name] DO   Signed by:   D. [name] DO on 04/23/2010   Method used:   Electronically to        [facility] (retail)       [place], [place]  [id number]       Ph: [phone]       Fax: [phone]   RxID:   [id number] METFORMIN HCL 500 MG TABS (METFORMIN HCL) one and 1/2 by mouth two times a day  #90 x 3   Entered and Authorized by:   D. [name] DO   Signed by:   D. [name] DO on 04/22/2010   Method used:   Electronically to        [facility] (retail)       [place], [place]  [id number]       Ph: [phone]       Fax: [phone]   [id number] LEVOTHYROXINE SODIUM 112 MCG TABS (LEVOTHYROXINE SODIUM) 2 tabs by mouth once daily  #60 x 2   Entered and Authorized by:   D. [name] DO   Signed by:   D. [name] DO on 04/22/2010   Method used:   Electronically to        [facility] (retail)       [place]       [place], [place]  [id number]  Ph: [phone]       Fax: [phone]   [id number]

## 2010-05-05 NOTE — Medication Information (Signed)
Summary: [facility] RX Profile   [facility] RX Profile   Imported By: Sallee Provencal 11/10/2009 12:52:24  _____________________________________________________________________  External Attachment:    Type:   Image     Comment:   External Document

## 2010-05-05 NOTE — Assessment & Plan Note (Signed)
Summary: 4 MONTH FOLLOW UP/[facility]   Vital Signs:  Patient profile:   65 year old female Height:      71 inches Weight:      219.50 pounds BMI:     30.72 O2 Sat:      94 % on Room air Temp:     97.5 degrees F oral Pulse rate:   75 / minute BP sitting:   110 / 70 Cuff size:   large  Vitals Entered By: Ernestene Mention (Aug 02, 2009 8:12 AM)  O2 Flow:  Room air CC: 4 mo f/u--Per pt no symptoms or complaints./kb Is Patient Diabetic? Yes Did you bring your meter with you today? No Pain Assessment Patient in pain? no        Primary Care Provider:  Jennings Books DO  CC:  4 mo f/u--Per pt no symptoms or complaints./kb.  History of Present Illness: 65 y/o AA female with cardiomyopathy, DM II, Htn for follow up cardio notes reviewed 2 D Echo -  Study Conclusions            - Left ventricle: The cavity size was mildly dilated. Wall thickness       was increased in a pattern of moderate LVH. Systolic function was       mildly reduced. The estimated ejection fraction was in the range       of 45% to 50%.     - Mitral valve: Mild regurgitation.     - Left atrium: The atrium was mildly dilated.     - Atrial septum: No defect or patent foramen ovale was identified.  started centrum silver.  she takes with other pills in AM  DM II - A1c stable.  she has tough time with appetite control.   her friend is diabetic.  she lost 50 lbs on low carb diet.  friend helping pt follow low carb diet  Current Medications (verified): 1)  Levoxyl 112 Mcg Tabs (Levothyroxine Sodium) .... 2 By Mouth Once Daily 2)  Carvedilol 25 Mg Tabs (Carvedilol) .... Take One Tablet By Mouth Twice A Day 3)  Zestoretic 20-12.5 Mg  Tabs (Lisinopril-Hydrochlorothiazide) .... Take 1 Tablet By Mouth Once A Day 4)  Nexium 40 Mg  Cpdr (Esomeprazole Magnesium) .... Take 1 Tablet By Mouth Once A Day 5)  [facility] Aspirin 81 Mg  Tbec (Aspirin) .... Take 1 Tablet By Mouth Once A Day 6)  Simvastatin 40 Mg Tabs (Simvastatin) ....  One By Mouth Qpm 7)  Amlodipine Besylate 10 Mg Tabs (Amlodipine Besylate) .... One By Mouth Once Daily 8)  Vitamin D 1000 Unit Tabs (Cholecalciferol) .... Take 1 Tablet By Mouth Once A Day 9)  Zostavax 19400 Unt/0.2ml Solr (Zoster Vaccine Live) .... Administer Vaccine X 1 10)  Centrum Silver  Tabs (Multiple Vitamins-Minerals) .Marland Kitchen.. 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   Neck:  supple and no masses.  no carotid bruits.   Lungs:  normal respiratory effort and normal breath sounds.   Heart:  normal rate, regular rhythm, and no gallop.   Extremities:  No lower extremity edema    Impression & Recommendations:  Problem # 1:  HYPOTHYROIDISM (ICD-244.9)  increase levothyroxine dose.  do not take with other meds esp vitamins. repeat TSH in 2 months Her updated medication list for this problem includes:    Levothyroxine Sodium 125 Mcg Tabs (Levothyroxine sodium) .Marland Kitchen... 2 tabs by mouth qd  Labs Reviewed: TSH: 9.03 (07/26/2009)    HgBA1c: 6.9 (07/26/2009) Chol: 210 (07/26/2009)   HDL: 57.50 (07/26/2009)   LDL: DEL (02/18/2008)   TG: 139.0 (07/26/2009)  Her updated medication list for this problem includes:    Levothyroxine Sodium 125 Mcg Tabs (Levothyroxine sodium) .Marland Kitchen... 2  tabs by mouth qd  Future Orders: T-TSH [id number]) ... 11/02/2009  Problem # 2:  DIABETES MELLITUS, TYPE II (ICD-250.00) EF 45 - 50 %.  start metformin.  we reviewed low carb diet  Her updated medication list for this problem includes:    Zestoretic 20-12.5 Mg Tabs (Lisinopril-hydrochlorothiazide) .Marland Kitchen... Take 1 tablet by mouth once a day    [facility] Aspirin 81 Mg Tbec (Aspirin) .Marland Kitchen... Take 1 tablet by mouth once a day    Metformin Hcl 500 Mg Tabs (Metformin hcl) ..... One by mouth two times a day  Labs Reviewed: Creat: 1.1 (07/26/2009)    Reviewed HgBA1c results: 6.9 (07/26/2009)  6.6 (04/07/2009)  Future Orders: T-Basic Metabolic Panel (99991111) ... 11/02/2009 T- Hemoglobin A1C [id number]) ... 11/02/2009  Her updated medication list for this problem includes:    Zestoretic 20-12.5 Mg Tabs (Lisinopril-hydrochlorothiazide) .Marland Kitchen... Take 1 tablet by mouth once a day    [facility] Aspirin 81 Mg Tbec (Aspirin) .Marland Kitchen... Take 1 tablet by mouth once a day    Metformin Hcl 500 Mg Tabs (Metformin hcl) ..... One by mouth two times a day  Problem # 3:  HYPERTENSION (ICD-401.9) excellent control.  Maintain current medication regimen.  Her updated medication list for this problem includes:    Carvedilol 25 Mg Tabs (Carvedilol) .Marland Kitchen... Take one tablet by mouth twice a day    Zestoretic 20-12.5 Mg Tabs (Lisinopril-hydrochlorothiazide) .Marland Kitchen... Take 1 tablet by mouth once a day    Amlodipine Besylate 10 Mg Tabs (Amlodipine besylate) ..... One by mouth once daily  Future Orders: T-Basic Metabolic Panel (99991111) ... 11/02/2009  BP today: 110/70 Prior BP: 132/68 (04/12/2009)  Prior 10 Yr Risk Heart Disease: Not enough information (10/29/2008)  Labs Reviewed: K+: 4.0 (07/26/2009) Creat: : 1.1 (07/26/2009)   Chol: 210 (07/26/2009)   HDL: 57.50 (07/26/2009)   LDL: DEL (02/18/2008)   TG: 139.0 (07/26/2009)  Complete Medication List: 1)  Levothyroxine Sodium 125 Mcg Tabs (Levothyroxine  sodium) .... 2 tabs by mouth qd 2)  Carvedilol 25 Mg Tabs (Carvedilol) .... Take one tablet by mouth twice a day 3)  Zestoretic 20-12.5 Mg Tabs (Lisinopril-hydrochlorothiazide) .... Take 1 tablet by mouth once a day 4)  Nexium 40 Mg Cpdr (Esomeprazole magnesium) .... Take 1 tablet by mouth once a day 5)  [facility] Aspirin 81 Mg Tbec (Aspirin) .... Take 1 tablet by mouth once a day 6)  Simvastatin 40 Mg Tabs (Simvastatin) .... One by mouth qpm 7)  Amlodipine Besylate 10 Mg Tabs (Amlodipine besylate) .... One by mouth once daily 8)  Vitamin D 1000 Unit Tabs (Cholecalciferol) .... Take 1 tablet by mouth once a day 9)  Zostavax 19400 Unt/0.4ml Solr (Zoster vaccine live) .... Administer vaccine x 1 10)  Centrum Silver Tabs (Multiple vitamins-minerals) .Marland Kitchen.. 1 by mouth qd 11)  Metformin Hcl 500 Mg Tabs (Metformin hcl) .... One by mouth two times a day  Patient Instructions: 1)  Please schedule a follow-up appointment in 3 months. 2)  BMP prior to visit, ICD-9: 401.9, 250.00 3)  TSH prior to visit, ICD-9: 244.9 4)  HbgA1C prior to visit, ICD-9: 250.00 5)  Please return for lab work one (1) week before your next appointment.  Prescriptions: METFORMIN HCL 500 MG TABS (METFORMIN HCL) one by mouth two times a day  #60 x 2   Entered and Authorized by:   D. [name] DO   Signed by:   D. [name] DO on 08/02/2009   Method used:   Electronically to        [facility] (retail)       [place], [place]  [id number]       Ph: [phone]       Fax: [phone]   RxID:   [id number] LEVOTHYROXINE SODIUM 125 MCG TABS (LEVOTHYROXINE SODIUM) 2 tabs by mouth qd  #60 x 3   Entered and Authorized by:   D. [name] DO   Signed by:   D. [name] DO on 08/02/2009   Method used:   Electronically to        [facility] (retail)       [address]       [place], [place]  [id number]       Ph: [phone]       Fax: [phone]    RxID:   [id number]

## 2010-05-05 NOTE — Progress Notes (Signed)
Summary: Amlodipine Refill  Phone Note Refill Request Message from:  Fax from Pharmacy on March 14, 2010 8:16 AM  Refills Requested: Medication #1:  AMLODIPINE BESYLATE 10 MG TABS one by mouth once daily   Dosage confirmed as above?Dosage Confirmed   Brand Name Necessary? No   Supply Requested: 3 months   Last Refilled: 09/01/2009 [facility] [address] [place] [address] fax [phone]   Method Requested: Electronic Next Appointment Scheduled: none  Initial call taken by: Shanon Payor,  March 14, 2010 8:17 AM  Follow-up for Phone Call        Rx completed in Dr. Brantley Stage Follow-up by: Jiles Garter CMA,  March 14, 2010 10:06 AM    Prescriptions: AMLODIPINE BESYLATE 10 MG TABS (AMLODIPINE BESYLATE) one by mouth once daily  #90 x 1   Entered by:   Jiles Garter CMA   Authorized by:   D. [name] DO   Signed by:   Jiles Garter CMA on 03/14/2010   Method used:   Electronically to        [facility] (retail)       [place], [place]  [id number]       Ph: [phone]       Fax: [phone]   RxID:   [id number]

## 2010-05-05 NOTE — Assessment & Plan Note (Signed)
Summary: ROV   Visit Type:  Follow-up Primary Provider:  Jennings Books DO  CC:  No cardiac complaints.  History of Present Illness: This is a 65 year old woman with nonischemic cardiomyopathy, presenting today for follow up evaluation. She also has lower extremity peripheral arterial disease with bilateral SFA occlusions and nonobstructive coronary artery disease.  She continues with a walking program about 3 days per week. She is able to walk 20 minutes at a slow pace without stopping. She denies chest pain, but continues to have dyspnea with exertion (vacuum cleaner, pushing a grocery cart). No leg swelling, orthopnea, or PND.  Still smoking about 1 pack cigarettes over 2 weeks.    Current Medications (verified): 1)  Levothyroxine Sodium 125 Mcg Tabs (Levothyroxine Sodium) .... 2 Tabs By Mouth Qd 2)  Carvedilol 25 Mg Tabs (Carvedilol) .... Take One Tablet By Mouth Twice A Day 3)  Lisinopril-Hydrochlorothiazide 20-12.5 Mg Tabs (Lisinopril-Hydrochlorothiazide) .... Take 1 Tablet By Mouth Once A Day 4)  [facility] Aspirin 81 Mg  Tbec (Aspirin) .... Take 1 Tablet By Mouth Once A Day 5)  Simvastatin 40 Mg Tabs (Simvastatin) .... One By Mouth Qpm 6)  Amlodipine Besylate 10 Mg Tabs (Amlodipine Besylate) .... One By Mouth Once Daily 7)  Centrum Silver  Tabs (Multiple Vitamins-Minerals) .Marland Kitchen.. 1 By Mouth Qd 8)  Metformin Hcl 500 Mg Tabs (Metformin Hcl) .... One By Mouth Two Times A Day  Allergies (verified): 1)  ! Codeine  Past History:  Past medical history reviewed for relevance to current acute and chronic problems.  Past Medical History: Hypertension History of Goiter   S/P Thyroidectomry   S/P Radioactive I131 thyoid ablation   Iatrogenic hypothyroidism Non ischemic cardiomyopathy EF 28% - reassessment of LV function 2011 with LVEF 45-50% Normal cardiac cath - 05/2007 with patent coronaries Lower extremity PAD with ABIs of 0.5 bilaterally    Review of Systems       Negative  except as per HPI   Vital Signs:  Patient profile:   65 year old female Height:      71 inches Weight:      220.50 pounds BMI:     30.86 Pulse rate:   70 / minute Pulse rhythm:   regular Resp:     18 per minute BP sitting:   124 / 70  (left arm) Cuff size:   large  Vitals Entered By: Sidney Ace (November 23, 2009 9:07 AM)  Physical Exam  General:  Pt is alert and oriented, in no acute distress. HEENT: normal Neck: normal carotid upstrokes without bruits, JVP normal Lungs: CTA CV: RRR without murmur or gallop Abd: soft, NT, positive BS, no bruit, no organomegaly Ext: no clubbing, cyanosis, or edema. peripheral pulses 2+ and equal Skin: warm and dry without rash    EKG  Procedure date:  11/23/2009  Findings:      NSR 70 bpm, first degree AVB, nonspecific Twave abnormality  Echocardiogram  Procedure date:  04/22/2009  Findings:      Study Conclusions            - Left ventricle: The cavity size was mildly dilated. Wall thickness       was increased in a pattern of moderate LVH. Systolic function was       mildly reduced. The estimated ejection fraction was in the range       of 45% to 50%.     - Mitral valve: Mild regurgitation.     - Left atrium:  The atrium was mildly dilated.     - Atrial septum: No defect or patent foramen ovale was identified.  Impression & Recommendations:  Problem # 1:  CHRONIC SYSTOLIC HEART FAILURE (123456) Pt is euvolemic. Her LVEF is improved and now up to 45-50%. She is on appropriate Rx with an ACE and beta-blocker at goal doses.  Her updated medication list for this problem includes:    Carvedilol 25 Mg Tabs (Carvedilol) .Marland Kitchen... Take one tablet by mouth twice a day    Lisinopril-hydrochlorothiazide 20-12.5 Mg Tabs (Lisinopril-hydrochlorothiazide) .Marland Kitchen... Take 1 tablet by mouth once a day    [facility] Aspirin 81 Mg Tbec (Aspirin) .Marland Kitchen... Take 1 tablet by mouth once a day    Amlodipine Besylate 10 Mg Tabs (Amlodipine besylate) .....  One by mouth once daily  Orders: EKG w/ Interpretation (93000)  Problem # 2:  ATHEROSCLEROSIS W /INT CLAUDICATION (ICD-440.21) Claudication symptoms resolved with a walking program. Discussed complete tobacco cessation. Continue antiplatelet Rx with ASA.  Orders: EKG w/ Interpretation (93000)  Problem # 3:  HYPERLIPIDEMIA (P102836.4) Lipids at goal - reduce simvastatin to 20 mg in setting of amlodipine.  Her updated medication list for this problem includes:    Simvastatin 20 Mg Tabs (Simvastatin) .Marland Kitchen... Take one tablet by mouth daily at bedtime  CHOL: 210 (07/26/2009)   LDL: DEL (02/18/2008)   HDL: 57.50 (07/26/2009)   TG: 139.0 (07/26/2009) CHOL (goal): 200 (10/29/2008)   LDL (goal): 70 (10/29/2008)   HDL (goal): 40 (10/29/2008)   TG (goal): 150 (10/29/2008)  Problem # 4:  HYPERTENSION (ICD-401.9) well-controlled.  Her updated medication list for this problem includes:    Carvedilol 25 Mg Tabs (Carvedilol) .Marland Kitchen... Take one tablet by mouth twice a day    Lisinopril-hydrochlorothiazide 20-12.5 Mg Tabs (Lisinopril-hydrochlorothiazide) .Marland Kitchen... Take 1 tablet by mouth once a day    [facility] Aspirin 81 Mg Tbec (Aspirin) .Marland Kitchen... Take 1 tablet by mouth once a day    Amlodipine Besylate 10 Mg Tabs (Amlodipine besylate) ..... One by mouth once daily  BP today: 124/70 Prior BP: 110/70 (08/02/2009)  Prior 10 Yr Risk Heart Disease: Not enough information (10/29/2008)  Labs Reviewed: K+: 4.0 (07/26/2009) Creat: : 1.1 (07/26/2009)   Chol: 210 (07/26/2009)   HDL: 57.50 (07/26/2009)   LDL: DEL (02/18/2008)   TG: 139.0 (07/26/2009)  Patient Instructions: 1)  Your physician wants you to follow-up in: 1 YEAR.   You will receive a reminder letter in the mail two months in advance. If you don't receive a letter, please call our office to schedule the follow-up appointment. 2)  Your physician has recommended you make the following change in your medication: DECREASE Simvastatin to 20mg  once a  day Prescriptions: SIMVASTATIN 20 MG TABS (SIMVASTATIN) Take one tablet by mouth daily at bedtime  #30 x 11   Entered by:   Theodosia Quay, RN, BSN   Authorized by:   Neale Burly, MD   Signed by:   Theodosia Quay, RN, BSN on 11/23/2009   Method used:   Electronically to        [facility] (retail)       [place], [place]  [id number]       Ph: [phone]       Fax: [phone]   [id number]

## 2010-05-05 NOTE — Assessment & Plan Note (Signed)
Summary: 3 month follow up/mjhf   Vital Signs:  Patient profile:   65 year old female Weight:      217.25 pounds BMI:     30.41 O2 Sat:      99 % on Room air Temp:     97.4 degrees F oral Pulse rate:   76 / minute Pulse rhythm:   regular Resp:     18 per minute BP sitting:   132 / 68  (right arm) Cuff size:   large  Vitals Entered By: Jiles Garter CMA (April 12, 2009 9:11 AM)  O2 Flow:  Room air  Primary Care Provider:  D. [name] DO  CC:  3 Month Follow up and Type 2 diabetes mellitus follow-up.  History of Present Illness: 3 Month Follow up  disease management  Type 2 Diabetes Mellitus Follow-Up      This is a 65 year old woman who presents for Type 2 diabetes mellitus follow-up.  The patient denies self managed hypoglycemia and hypoglycemia requiring help.  The patient denies the following symptoms: chest pain.  Since the last visit the patient reports fair dietary compliance and compliance with medications.  Low Blood Sugar 89 high 125 avg 91  she still has occ cigarette  PVD - no claudication symptoms  Allergies (verified): No Known Drug Allergies  Past History:  Past Medical History: Hypertension History of Goiter   S/P Thyroidectomry  S/P Radioactive I131 thyoid ablation   Iatrogenic hypothyroidism Non ischemic cardiomyopathy EF 28% Normal cardiac cath - 05/2007  EF 35% Lower extremity PAD with ABIs of 0.5 bilaterally    Past Surgical History: Hysterectomy Thyroidectomy Tonsillectomy         Family History: Mother with history of DM II, Htn Father deceased age 73 - accident      Social History: Retired Education officer, museum Divorced - one grown son Former Smoker - smoked since age 74 < 1ppd Alcohol use-no   Drug use-no  Regular exercise-yes       Physical Exam  General:  alert and overweight-appearing.   Ears:  R ear normal and L ear normal.   Neck:  supple and no masses.  no carotid bruits.   Lungs:  normal respiratory effort and normal  breath sounds.   Heart:  normal rate, regular rhythm, and no gallop.   Abdomen:  soft, non-tender, and normal bowel sounds.   Extremities:  No lower extremity edema  Neurologic:  cranial nerves II-XII intact and gait normal.   Psych:  normally interactive, good eye contact, not anxious appearing, and not depressed appearing.    Diabetes Management Exam:    Foot Exam (with socks and/or shoes not present):       Inspection:          Left foot: normal          Right foot: normal   Impression & Recommendations:  Problem # 1:  DIABETES MELLITUS, TYPE II (ICD-250.00) diet controlled.  A1c is stable.  she struggles with appetite control.  EF better.  consider start metformin if a1c gets worse.  Her updated medication list for this problem includes:    Zestoretic 20-12.5 Mg Tabs (Lisinopril-hydrochlorothiazide) .Marland Kitchen... Take 1 tablet by mouth once a day    [facility] Aspirin 81 Mg Tbec (Aspirin) .Marland Kitchen... Take 1 tablet by mouth once a day  Labs Reviewed: Creat: 1.1 (04/07/2009)    Reviewed HgBA1c results: 6.6 (04/07/2009)  6.6 (10/22/2008)  Problem # 2:  HYPERLIPIDEMIA (ICD-272.4) she  forgot to take simvastatin for last several weeks.  I stressed importance of compliance.  Her updated medication list for this problem includes:    Simvastatin 40 Mg Tabs (Simvastatin) ..... One by mouth qpm  Labs Reviewed: SGOT: 18 (04/07/2009)   SGPT: 14 (04/07/2009)  Lipid Goals: Chol Goal: 200 (10/29/2008)   HDL Goal: 40 (10/29/2008)   LDL Goal: 70 (10/29/2008)   TG Goal: 150 (10/29/2008)  Prior 10 Yr Risk Heart Disease: Not enough information (10/29/2008)   HDL:51.00 (04/07/2009), 54.00 (10/22/2008)  LDL:DEL (02/18/2008), DEL (09/02/2007)  Chol:276 (04/07/2009), 203 (10/22/2008)  Trig:151.0 (04/07/2009), 133.0 (10/22/2008)  Problem # 3:  HYPERTENSION (ICD-401.9) stable.  Maintain current medication regimen.  Her updated medication list for this problem includes:    Carvedilol 25 Mg Tabs (Carvedilol)  .Marland Kitchen... Take one tablet by mouth twice a day    Zestoretic 20-12.5 Mg Tabs (Lisinopril-hydrochlorothiazide) .Marland Kitchen... Take 1 tablet by mouth once a day    Amlodipine Besylate 10 Mg Tabs (Amlodipine besylate) ..... One by mouth once daily  BP today: 132/68 Prior BP: 136/83 (03/19/2009)  Prior 10 Yr Risk Heart Disease: Not enough information (10/29/2008)  Labs Reviewed: K+: 4.3 (04/07/2009) Creat: : 1.1 (04/07/2009)   Chol: 276 (04/07/2009)   HDL: 51.00 (04/07/2009)   LDL: DEL (02/18/2008)   TG: 151.0 (04/07/2009)  Problem # 4:  TOBACCO ABUSE (ICD-305.1) smokes once to twice per week.  I encouraged complete smoking cessation.  The following medications were removed from the medication list:    Chantix Starting Month Pak 0.5 Mg X 11 & 1 Mg X 42 Tabs (Varenicline tartrate) .Marland Kitchen... Take as directed  Complete Medication List: 1)  Levoxyl 112 Mcg Tabs (Levothyroxine sodium) .... 2 by mouth once daily 2)  Carvedilol 25 Mg Tabs (Carvedilol) .... Take one tablet by mouth twice a day 3)  Zestoretic 20-12.5 Mg Tabs (Lisinopril-hydrochlorothiazide) .... Take 1 tablet by mouth once a day 4)  Nexium 40 Mg Cpdr (Esomeprazole magnesium) .... Take 1 tablet by mouth once a day 5)  [facility] Joseph Aspirin 81 Mg Tbec (Aspirin) .... Take 1 tablet by mouth once a day 6)  Simvastatin 40 Mg Tabs (Simvastatin) .... One by mouth qpm 7)  Amlodipine Besylate 10 Mg Tabs (Amlodipine besylate) .... One by mouth once daily 8)  Vitamin D 1000 Unit Tabs (Cholecalciferol) .... Take 1 tablet by mouth once a day 9)  Zostavax 19400 Unt/0.70ml Solr (Zoster vaccine live) .... Administer vaccine x 1  Patient Instructions: 1)  Please schedule a follow-up appointment in 4 months. 2)  BMP prior to visit, ICD-9:  401.9 3)  Lipid Panel prior to visit, ICD-9: 272.4 4)  TSH prior to visit, ICD-9: 244.90 5)  HbgA1C prior to visit, ICD-9: 250.00 6)  Please return for lab work one (1) week before your next appointment.   Prescriptions: AMLODIPINE BESYLATE 10 MG TABS (AMLODIPINE BESYLATE) one by mouth once daily  #90 x 1   Entered and Authorized by:   D. [name] DO   Signed by:   D. [name] DO on 04/12/2009   Method used:   Electronically to        [facility] (retail)       [place], [place]  [id number]       Ph: [phone]       Fax: [phone]   RxID:   [id number] SIMVASTATIN 40 MG TABS (SIMVASTATIN) one by mouth qpm  #90  x 1   Entered and Authorized by:   D. [name] DO   Signed by:   D. [name] DO on 04/12/2009   Method used:   Electronically to        [facility] (retail)       [place], [place]  [id number]       Ph: [phone]       Fax: [phone]   RxID:   [id number] NEXIUM 40 MG  CPDR (ESOMEPRAZOLE MAGNESIUM) Take 1 tablet by mouth once a day  #90 x 1   Entered and Authorized by:   D. [name] DO   Signed by:   D. [name] DO on 04/12/2009   Method used:   Electronically to        [facility] (retail)       [place], [place]  [id number]       Ph: [phone]       Fax: [phone]   [id number] ZESTORETIC 20-12.5 MG  TABS (LISINOPRIL-HYDROCHLOROTHIAZIDE) Take 1 tablet by mouth once a day  #90 x 1   Entered and Authorized by:   D. [name] DO   Signed by:   D. [name] DO on 04/12/2009   Method used:   Electronically to        [facility] (retail)       [place], [place]  [id number]       Ph: [phone]       Fax: [phone]   RxID:   [id number] LEVOXYL 112 MCG TABS (LEVOTHYROXINE SODIUM) 2 by mouth once daily  #60 x 5   Entered and Authorized by:   D. [name] DO   Signed by:   D. [name] DO on 04/12/2009   Method used:   Electronically to        [facility] (retail)       [place], [place]   [id number]       Ph: [phone]       Fax: [phone]   [id number] ZOSTAVAX 09811 UNT/0.65ML SOLR (ZOSTER VACCINE LIVE) administer vaccine x 1  #1 x 0   Entered and Authorized by:   D. [name] DO   Signed by:   D. [name] DO on 04/12/2009   Method used:   Print then Give to Patient   RxID:   [id number]   Current Allergies (reviewed today): No known allergies

## 2010-05-09 ENCOUNTER — Telehealth: Payer: Self-pay | Admitting: Internal Medicine

## 2010-05-10 ENCOUNTER — Encounter: Payer: Self-pay | Admitting: Internal Medicine

## 2010-05-11 NOTE — Assessment & Plan Note (Signed)
Summary: FOLLOW UP/MHF   Vital Signs:  Patient profile:   65 year old female Height:      71 inches Weight:      220 pounds BMI:     30.79 O2 Sat:      99 % on Room air Temp:     97.5 degrees F oral Pulse rate:   86 / minute Resp:     18 per minute BP sitting:   142 / 80  (right arm) Cuff size:   large  Vitals Entered By: Jiles Garter CMA (April 20, 2010 8:06 AM)  O2 Flow:  Room air CC: follow-up visit Is Patient Diabetic? No Pain Assessment Patient in pain? no      Comments discuss Metformin, has not taking, fasting for blood work, c/o head congestion for the past 2 weeks, just started sneezing     Last PAP Result due   Primary Care Provider:  Jennings Books DO  CC:  follow-up visit.  History of Present Illness:  Hyperlipidemia Follow-Up      This is a 65 year old woman who presents for Hyperlipidemia follow-up.  The patient denies the following symptoms: chest pain/pressure.  Compliance with medications (by patient report) has been intermittent.  Dietary compliance has been fair and poor.  The patient reports no exercise.    hypothyroidism - taking medications regularly.  son helping out 1 x per week  Preventive Screening-Counseling & Management  Alcohol-Tobacco     Smoking Status: current     Smoking Cessation Counseling: yes     Packs/Day: <0.25  Allergies: 1)  ! 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    Diabetes Management Exam:    Foot Exam (with socks and/or shoes not present):       Inspection:          Left foot: abnormal             Comments: callus on top of 2 nd toe           Right foot: abnormal             Comments: calluses   Impression & Recommendations:  Problem # 1:  HYPOTHYROIDISM (ICD-244.9) Assessment Improved Pt reports taking her thyroid medication on a regular basis  Her updated medication list for this problem includes:    Levothyroxine Sodium 112 Mcg Tabs (Levothyroxine sodium) .Marland Kitchen... 2 tabs by mouth once daily  Orders: T-TSH [phone])  Labs Reviewed: TSH: 9.03 (07/26/2009)    HgBA1c: 6.9 (07/26/2009) Chol: 210 (07/26/2009)   HDL: 57.50 (07/26/2009)   LDL: DEL (02/18/2008)   TG: 139.0 (07/26/2009)  Problem # 2:  HYPERLIPIDEMIA (ICD-272.4) encouraged dietary and medication compliance  goal LDL < 70 Her updated medication list for this problem includes:  Crestor 20 Mg Tabs (Rosuvastatin calcium) ..... One by mouth once daily  Orders: T-Hepatic Function [id number]) T-Lipid Profile [phone])  Labs Reviewed: SGOT: 18 (04/07/2009)   SGPT: 14 (04/07/2009)  Lipid Goals: Chol Goal: 200 (10/29/2008)   HDL Goal: 40 (10/29/2008)   LDL Goal: 70 (10/29/2008)   TG Goal: 150 (10/29/2008)  Prior 10 Yr Risk Heart Disease: Not enough information (10/29/2008)   HDL:57.50 (07/26/2009), 51.00 (04/07/2009)  LDL:DEL (02/18/2008), DEL (09/02/2007)  Chol:210 (07/26/2009), 276 (04/07/2009)  Trig:139.0 (07/26/2009), 151.0 (04/07/2009)  Problem # 3:  HYPERTENSION (ICD-401.9) Assessment: Unchanged  Her updated medication list for this problem includes:    Carvedilol 25 Mg Tabs (Carvedilol) .Marland Kitchen... Take one tablet by mouth twice a day    Amlodipine Besylate 10 Mg Tabs (Amlodipine besylate) ..... One by mouth once daily    Losartan  Potassium-hctz 100-25 Mg Tabs (Losartan potassium-hctz) ..... One by mouth once daily  BP today: 142/80 Prior BP: 140/90 (01/03/2010)  Prior 10 Yr Risk Heart Disease: Not enough information (10/29/2008)  Labs Reviewed: K+: 4.0 (07/26/2009) Creat: : 1.1 (07/26/2009)   Chol: 210 (07/26/2009)   HDL: 57.50 (07/26/2009)   LDL: DEL (02/18/2008)   TG: 139.0 (07/26/2009)  Problem # 4:  TOBACCO ABUSE (ICD-305.1)  Encouraged smoking cessation and discussed different methods for smoking cessation.   Complete Medication List: 1)  Levothyroxine Sodium 112 Mcg Tabs (Levothyroxine sodium) .... 2 tabs by mouth once daily 2)  Carvedilol 25 Mg Tabs (Carvedilol) .... Take one tablet by mouth twice a day 3)  [facility] Aspirin 81 Mg Tbec (Aspirin) .... Take 1 tablet by mouth once a day 4)  Crestor 20 Mg Tabs (Rosuvastatin calcium) .... One by mouth once daily 5)  Amlodipine Besylate 10 Mg Tabs (Amlodipine besylate) .... One by mouth once daily 6)  Centrum Silver Tabs (Multiple vitamins-minerals) .Marland Kitchen.. 1 by mouth qd 7)  Losartan Potassium-hctz 100-25 Mg Tabs (Losartan potassium-hctz) .... One by mouth once daily 8)  Metformin Hcl 500 Mg Tabs (Metformin hcl) .... One and 1/2 by mouth two times a day 9)  Onetouch Test Strp (Glucose blood) .... Test blood sugar once daily  Other Orders: T-Basic Metabolic Panel ([id number]) T- Hemoglobin A1C [id number]) T-Urine Microalbumin w/creat. ratio [id number])  Patient Instructions: 1)  Please schedule a follow-up appointment in 3 months. Prescriptions: LOSARTAN POTASSIUM-HCTZ 100-25 MG TABS (LOSARTAN POTASSIUM-HCTZ) one by mouth once daily  #90 x 1   Entered and Authorized by:   D. [name] DO   Signed by:   D. [name] DO on 04/20/2010   Method used:   Electronically to        [facility] (retail)       [place], [place]  [id number]       Ph: [phone]       Fax: [phone]   RxID:    [id number] SIMVASTATIN 20 MG TABS (SIMVASTATIN) Take one tablet by mouth daily at bedtime  #90 x 1   Entered and Authorized by:   D. [name] DO   Signed by:   D. [name] DO on 04/20/2010   Method used:   Electronically to        [facility] (retail)       [place]       [place], [place]  [id number]       Ph: [phone]       Fax: [phone]  [id number] ONETOUCH TEST  STRP (GLUCOSE BLOOD) test blood sugar once daily  #100 x 5   Entered and Authorized by:   D. [name] DO   Signed by:   D. [name] DO on 04/20/2010   Method used:   Electronically to        [facility] (retail)       [place], [place]  [id number]       Ph: [phone]       Fax: [phone]   RxID:   [id number] METFORMIN HCL 500 MG TABS (METFORMIN HCL) one by mouth two times a day  #60 x 5   Entered and Authorized by:   D. [name] DO   Signed by:   D. [name] DO on 04/20/2010   Method used:   Electronically to        [facility] (retail)       [place], [place]  [id number]       Ph: [phone]       Fax: [phone]   RxID:   [id number]    Orders Added: 1)  T-Basic Metabolic Panel 0000000 2)  T- Hemoglobin A1C [83036-23375] 3)  T-TSH [id number] 4)  T-Urine Microalbumin w/creat. ratio [[id number]] 5)  T-Hepatic Function [80076-22960] 6)  T-Lipid Profile [80061-22930] 7)  Est. Patient Level III [id number]   Immunization History:  Influenza Immunization History:    Influenza:  historical (02/17/2010)   Immunization History:  Influenza Immunization History:    Influenza:  Historical (02/17/2010)  Current Allergies (reviewed today): ! CODEINE     Preventive Care Screening  Pap Smear:    Date:  04/20/2010    Results:  due  Last Flu Shot:    Date:  02/17/2010    Results:  Historical

## 2010-05-19 NOTE — Progress Notes (Signed)
Summary: Diabetic shoes  Phone Note Call from Patient Call back at Home Phone [phone]   Caller: Patient Summary of Call: VM left at 9:16am pt states she has rx for diabetic shoes, but needs rx for diabetic shoes with insert please fax to [facility] ([address] [facility]) Initial call taken by: Abelino Derrick CMA Deborra Medina),  May 09, 2010 4:00 PM  Follow-up for Phone Call        patient stopped by office and dropped off form for completion for diabetic inserts. From has been signed and faxed to [facility] at [phone] Follow-up by: Jiles Garter CMA,  May 11, 2010 10:08 AM

## 2010-05-25 NOTE — Medication Information (Signed)
Summary: Diabetic Shoes/[employer]  Diabetic Shoes/[employer]   Imported By: Laural Benes 05/19/2010 15:19:13  _____________________________________________________________________  External Attachment:    Type:   Image     Comment:   External Document

## 2010-05-25 NOTE — Medication Information (Signed)
Summary: Order for Diabetic Shoes  Order for Diabetic Shoes   Imported By: Laural Benes 05/19/2010 14:40:22  _____________________________________________________________________  External Attachment:    Type:   Image     Comment:   External Document

## 2010-06-02 ENCOUNTER — Encounter: Payer: Self-pay | Admitting: Internal Medicine

## 2010-06-14 NOTE — Letter (Signed)
Summary: [facility]  [facility]   Imported By: Edmonia James 06/09/2010 09:47:23  _____________________________________________________________________  External Attachment:    Type:   Image     Comment:   External Document

## 2010-07-18 ENCOUNTER — Other Ambulatory Visit: Payer: Self-pay

## 2010-07-18 ENCOUNTER — Other Ambulatory Visit: Payer: Self-pay | Admitting: Internal Medicine

## 2010-07-18 DIAGNOSIS — E119 Type 2 diabetes mellitus without complications: Secondary | ICD-10-CM

## 2010-07-18 DIAGNOSIS — I10 Essential (primary) hypertension: Secondary | ICD-10-CM

## 2010-07-18 DIAGNOSIS — E039 Hypothyroidism, unspecified: Secondary | ICD-10-CM

## 2010-07-25 ENCOUNTER — Encounter: Payer: Self-pay | Admitting: Internal Medicine

## 2010-08-03 ENCOUNTER — Other Ambulatory Visit (INDEPENDENT_AMBULATORY_CARE_PROVIDER_SITE_OTHER): Payer: Medicare Other

## 2010-08-03 DIAGNOSIS — I10 Essential (primary) hypertension: Secondary | ICD-10-CM

## 2010-08-03 DIAGNOSIS — E119 Type 2 diabetes mellitus without complications: Secondary | ICD-10-CM

## 2010-08-03 DIAGNOSIS — E039 Hypothyroidism, unspecified: Secondary | ICD-10-CM

## 2010-08-03 LAB — BASIC METABOLIC PANEL
Calcium: 9.3 mg/dL (ref 8.4–10.5)
GFR: 63.72 mL/min (ref >60.00)
Sodium: 139 mEq/L (ref 135–145)

## 2010-08-03 LAB — HEMOGLOBIN A1C: Hgb A1c MFr Bld: 7.3 % — ABNORMAL HIGH (ref 4.6–6.5)

## 2010-08-10 ENCOUNTER — Encounter: Payer: Self-pay | Admitting: Internal Medicine

## 2010-08-11 ENCOUNTER — Encounter: Payer: Self-pay | Admitting: Internal Medicine

## 2010-08-11 ENCOUNTER — Ambulatory Visit: Payer: Medicare Other | Admitting: Internal Medicine

## 2010-08-11 VITALS — BP 132/70 | HR 79 | Temp 97.4°F | Resp 18 | Ht 71.0 in | Wt 214.0 lb

## 2010-08-11 DIAGNOSIS — E039 Hypothyroidism, unspecified: Secondary | ICD-10-CM

## 2010-08-11 DIAGNOSIS — E119 Type 2 diabetes mellitus without complications: Secondary | ICD-10-CM

## 2010-08-11 DIAGNOSIS — Z0289 Encounter for other administrative examinations: Secondary | ICD-10-CM

## 2010-08-11 DIAGNOSIS — G4700 Insomnia, unspecified: Secondary | ICD-10-CM

## 2010-08-11 MED ORDER — METFORMIN HCL 500 MG PO TABS: 500.0000 mg | ORAL_TABLET | Freq: Two times a day (BID) | ORAL | Status: DC

## 2010-08-11 NOTE — Progress Notes (Signed)
  Subjective:    Patient ID: LOUISE COONAN, female    DOB: 08/18/1945, 65 y.o.   MRN: [id number]  HPI    Review of Systems     Past Medical History  Diagnosis Date  . Hypertension   . Personal history of goiter   . S/P thyroidectomy   . S/P radioactive iodine thyroid ablation   . Iatrogenic thyroiditis   . Non-ischemic cardiomyopathy     EF 28%- reassessment of LV function 2011 with LVEf 45-50%  . History of cardiac cath 05/2007    normal-with patent coronaries  . PAD (peripheral artery disease)     lower extremities with ABIs of 0.5 bilaterally    History   Social History  . Marital Status: Divorced    Spouse Name: N/A    Number of Children: N/A  . Years of Education: N/A   Occupational History  . Not on file.   Social History Main Topics  . Smoking status: Current Everyday Smoker  . Smokeless tobacco: Not on file  . Alcohol Use: No  . Drug Use: No  . Sexually Active: Not on file   Other Topics Concern  . 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 Total abdominal hysterectomy   . Thyroidectomy   . Tonsillectomy     Family History  Problem Relation Age of Onset  . Diabetes type II Mother   . Hypertension Mother   . Other Father     deceased from accident age 53    Allergies  Allergen Reactions  . Codeine     Current Outpatient Prescriptions on File Prior to Visit  Medication Sig Dispense Refill  . amLODipine (NORVASC) 10 MG tablet Take 10 mg by mouth daily.        Marland Kitchen aspirin 81 MG tablet Take 81 mg by mouth daily.        . carvedilol (COREG) 25 MG tablet Take 25 mg by mouth 2 (two) times daily with a meal.        . glucose blood (ONE TOUCH TEST STRIPS) test strip Use as instructed to check blood sugar once a day       . levothyroxine (SYNTHROID, LEVOTHROID) 112 MCG tablet  Take 112 mcg by mouth. 2 tablets by mouth once a day       . losartan-hydrochlorothiazide (HYZAAR) 100-25 MG per tablet Take 1 tablet by mouth daily.        . Multiple Vitamins-Minerals (CENTRUM SILVER PO) Take by mouth daily.        . rosuvastatin (CRESTOR) 20 MG tablet Take 20 mg by mouth daily.        Marland Kitchen DISCONTD: metFORMIN (GLUCOPHAGE) 500 MG tablet Take 500 mg by mouth. 1/2 tablet by mouth two times a day         BP 132/70  Pulse 79  Temp(Src) 97.4 F (36.3 C) (Oral)  Resp 18  Ht 5\' 11"  (1.803 m)  Wt 214 lb (97.07 kg)  BMI 29.85 kg/m2  SpO2 98%    Objective:   Physical Exam        Assessment & Plan:

## 2010-08-11 NOTE — Patient Instructions (Signed)
Take over the counter melatonin 2 mg (1/2 tab at 4 PM and 1/2 tab at 8 PM) Please complete the following lab tests before your next follow up appointment: TSH:  244.0 BMET, A1c - 250.00

## 2010-08-16 NOTE — Assessment & Plan Note (Signed)
[place]                            CARDIOLOGY OFFICE NOTE   NAME:Paula Stein, Paula Stein                        MRN:          [id number]  DATE:02/06/2007                            DOB:          March 22, 1946    REASON FOR CONSULTATION:  Pericardial effusion, chest pain, shortness of  breath.   HISTORY OF PRESENT ILLNESS:  Paula Stein is a pleasant 65 year old woman  with a history of hypertension, hypothyroidism, with previous  radioiodine ablation and no reported history of cardiovascular disease.  She states that she had a cardiac catheterization perhaps greater than  10 years ago that showed no significant blockages.  She was recently  seen in the emergency department due to symptoms of shortness of breath,  fatigue, and chest discomfort.  She states that a few weeks ago, she  began to feel nausea with subsequent emesis, weakness, low grade  temperature of 101, fatigue, and ultimately chest heaviness with  subsequent orthopnea.  She did have a CT scan of her chest done in the  emergency department on October 27.  This study revealed no pulmonary  emboli.  Cardiomegaly was noted, and there was a small-to-moderate  pericardial effusion without pleural effusions.  Also noted were  probable reactive mediastinal and hilar lymph nodes but no frank air  space disease or masses.  I also note that the patient had a hemoglobin  of 11.7, BUN and creatinine of 14 and 1.2, troponin I of less than 0.05  with a CK-MB of 3.8 and a significantly elevated TSH of 87.2 with a free  T4 of 0.6.  The patient reported in retrospect that she had not been  taking her Levoxyl for several weeks and prior to that had actually been  only taking a half dose.  Her urinalysis demonstrated positive nitrites  with greater than 300 protein.   Since that time, she established with Dr. Shawna Stein in the office and has been  placed back on Levoxyl with plan for escalating dose and follow-up TSH.  She  reports resolution of her chest pain at this time but still feels  generally fatigued and short of breath with activity.   Her electrocardiogram today shows sinus rhythm with a prolonged P-R  interval of 232 ms and nonspecific ST-T wave changes.  No electrical  alternans is evident.  She had an echocardiogram done on October 31  which demonstrated an ejection fraction of 40% with inferior septal  hypokinesis, a small-to-moderate pericardial effusion that was  circumferential to the heart.  Right ventricular function was described  as normal.   Dr. Shawna Stein has already initiated a fairly thoughtful evaluation of this  process, including placement of a PPD recently, which was normal, per  patient report, and also a plan for labs, including double-strength DNA,  erythrocyte sedimentation rate, serum complement, and HIV.  Medical  regimen has been adjusted and is outlined below.   ALLERGIES:  No known drug allergies.   CURRENT MEDICATIONS:  1. Levoxyl 175 mcg p.o. daily.  2. Zesteretic 20/12.5 mg p.o. daily.  3. Coreg CR 20 mg p.o.  daily.  4. Aspirin 81 mg p.o. daily.   PAST MEDICAL HISTORY:  As outlined above.  Patient has a history of  goiter and radio-iodine ablation with subsequent hypothyroidism.  She is  status post hysterectomy and tonsillectomy.   SOCIAL HISTORY:  Patient is divorced.  She has one grown son, present  today.  She is a retired Education officer, museum.  She has a previous tobacco use  history, less than a pack per day, smoking since the age of 62, but quit  within the last few weeks.  She denies any alcohol use.  She has not  been exercising recently.   FAMILY HISTORY:  Reviewed and is noncontributory for obvious connective  tissue disease or cardiomyopathy.   PHYSICAL EXAMINATION:  Blood pressure today 141/81 (pulsus paradoxus of  only 5 mmHg), heart rate 84, weight 224 pounds.  This is an overweight  woman in no acute distress.  HEENT:  Conjunctivae normal.  Oropharynx  clear.  NECK:  Supple but full.  No thyroid tenderness noted.  Some general  fullness.  No carotid bruits.  LUNGS:  Clear, nonlabored breathing, no egophony or Ewart's sign.  No  pleural rub.  CARDIAC:  Regular rate and rhythm.  No S3 gallop or pericardial rub is  evident.  ABDOMEN:  Soft and nontender.  No obvious hepatomegaly.  No bruits.  EXTREMITIES:  No frank pitting edema.  Distal pulses are 1+.  SKIN:  Dry.  MUSCULOSKELETAL:  No kyphosis is noted.  NEUROPSYCHIATRIC:  Patient is alert and oriented x3.  Affect is normal.   IMPRESSION/RECOMMENDATIONS:  Small to moderate pericardial effusion  based on CT of the chest and subsequent echocardiogram.  This is in the  setting of uncontrolled hypothyroidism and may well be related, as  already indicated by Dr. Shawna Stein.  Patient does have documentation of  decreased left ventricular function in the range of 40% with inferior  septal hypokinesis, again, potentially related to her thyroid status.  This does, however, raise the possibility of underlying ischemic heart  disease, and she has not had any recent evaluation.  She reports chest  pain a few weeks ago but nothing recently.  This may have been more  related to a pericardial process rather than ischemia.  Her  electrocardiogram does not show frank inferior Q waves.   At this point, our plan will be an Adenosine Myoview for assessment of  ischemia.  Her medical regimen is quite reasonable at this time,  including aspirin, ACE inhibitor, low dose diuretic, and beta blocker.  Importantly, she has been placed back on her Levoxyl and has been  compliant with this.  I agree with the remaining serologies that Dr. Shawna Stein  has already arranged, and I would like to see her back over the next  month for further review.  Most likely, this process is related to her  uncontrolled hypothyroidism, or perhaps even an inflammatory process  such as a viral pericarditis.  Exclusion of other possibilities is   pending, the least likely of which seems to be ischemic heart disease,  based on present information.  I discussed this with the patient and her  son today, and I will plan to follow up with her on the Myoview results.   Further plans to follow.     Satira Sark, MD  Electronically Signed    SGM/MedQ  DD: 02/06/2007  DT: 02/07/2007  Job #: [id number]   cc:   Sandy Salaam. Paula Orleans, DO

## 2010-08-16 NOTE — Assessment & Plan Note (Signed)
[facility]                            CARDIOLOGY OFFICE NOTE   NAME:Stein Stein DUBBS                        MRN:          [id number]  DATE:05/23/2007                            DOB:          1946-04-02    PRIMARY CARE PHYSICIAN:  Dr. [name].   REASON FOR VISIT:  Follow-up cardiac catheterization.   HISTORY OF PRESENT ILLNESS:  Stein Stein comes back to the office following  her cardiac catheterization done recently.  We referred her for a  diagnostic angiogram to clearly exclude coronary disease in the setting  of cardiomyopathy with mildly abnormal Myoview.  This study confirms  nonobstructive mild coronary atherosclerosis without any significant  flow-limiting lesions.  Her mean capillary wedge pressure was 19 with  normal pulmonary systolic pressures and cardiac output of 4.2.  Ejection  fraction was approximately 35% with 2+ mitral regurgitation and left  ventricular end-diastolic pressure of that was 18 mmHg.  She states that  she is starting on a statin medication as arranged by Dr. Shawna Orleans for her  LDL cholesterol of 164.  We talked about changing her Coreg CR to  generic Coreg and increasing this to 12.5 mg b.i.d. for better heart  rate control.  Symptomatically, she reports feeling very well without  any major limiting breathlessness beyond NYHA class II.  She had no  orthopnea, PND or lower extremity edema.  She continues undergo  titration of her Levoxyl for hypothyroidism.  My hope is that her left  ventricular function will improve over time as thyroid status stabilizes  and she continues medical therapy.   ALLERGIES:  No known drug allergies.   MEDICATIONS:  Levoxyl 175 mcg p.o. daily, just recently increased  (details not clear).  Aspirin 81 mg p.o. daily, Coreg CR 40 mg p.o.  daily, Zestoretic 20/12.5 mg p.o. daily, digoxin 125 mcg p.o. daily,  Levoxyl 225 mcg p.o. daily.   REVIEW OF SYSTEMS:  The systems present illness.  Otherwise,  negative.   EXAMINATION:  Blood pressure is 128/84 rate is 90 and regular, weight  109 pounds stable.  The patient is comfortable in no acute distress.  HEENT:  Conjunctivae lids normal.  Face clear.  NECK:  Is supple.  No elevated pressure loud bruits or thyromegaly.  LUNGS:  Otherwise clear labored breathing at rest.  Cardiac samples a regular rate and rhythm.  Soft S3.  No pericardial  rub.  ABDOMEN:  Soft, nontender.  Normoactive bowel sounds.  EXTREMITIES:  Exhibit no significant pitting edema.   IMPRESSION/RECOMMENDATIONS:  Nonischemic cardiomyopathy associated with  hypothyroidism.  Plan will be continue medical therapy.  I will change  her Coreg CR to generic Coreg 12.5 mg p.o. b.i.d.  She will continue her  other medications.  I agree with the addition of a statin for more  optimal management of her LDL the setting of mild coronary  atherosclerosis.  We will plan to see back over the next 3 months for clinical assessment.  She will otherwise continue to see Dr. Shawna Orleans.     Satira Sark, MD  Electronically Signed  SGM/MedQ  DD: 05/23/2007  DT: 05/24/2007  Job #: [id number]

## 2010-08-16 NOTE — Cardiovascular Report (Signed)
Paula Stein, Paula Stein                 ACCOUNT NO.:  [id number]   MEDICAL RECORD NO.:  [id number]          PATIENT TYPE:  OIB   LOCATION:  1961                         FACILITY:  [facility]   PHYSICIAN:  Satira Sark, MD DATE OF BIRTH:  09-14-1945   DATE OF PROCEDURE:  05/13/2007  DATE OF DISCHARGE:  05/13/2007                            CARDIAC CATHETERIZATION   PRIMARY CARE PHYSICIAN:  Dr. [name].  Cardiologist is Dr. Rozann Lesches.   INDICATIONS:  Ms. Clyburn is a pleasant 65 year old woman with a history of  hypothyroidism, hyperlipidemia, and cardiomyopathy diagnosed in the  setting of clinical hypothyroidism and a pericardial effusion.  She  underwent noninvasive Myoview imaging indicating the possibility of  anterior basal scar.  She has been treated medically and is referred now  for clear definition of the coronary anatomy and to assess for any  revascularizations options.  The potential risks, benefits were  explained to her in advance, and informed consent was obtained.   PROCEDURE PERFORMED:  1. Right heart catheterization.  2. Selective coronary angiography.  3. Left ventriculography.   ACCESS AND EQUIPMENT:  The area about the right femoral artery and vein  was anesthetized with 1% lidocaine.  A 4-French sheath was placed in the  right femoral artery via the modified Seldinger technique followed by a  7-French sheath in the right femoral vein via modified Seldinger  technique.  A Doppler needle was used for arterial cannulation.  Standard preformed balloon-tipped flow-directed catheter was used for  right heart catheterization and hemodynamic assessment.  Standard  preformed 4-French JL-4, and 3-D RC catheters were used for selective  coronary geography Peter.  An angled pigtail catheter was used for left  heart catheterization and left ventriculography.  Total of 90 mL  Omnipaque was used.  All exchanges were made over wire with the  exception of the right heart  catheter.  The patient tolerated the  procedure well without any immediate complications.   HEMODYNAMICS:  Right atrium mean of 7, right ventricle 32/6.  Pulmonary  artery 32/15. Coronary capillary wedge pressure mean of 19.  Arterial  saturation 94%.  Pulmonary artery saturation 53%.  Aorta 144/79.  Left  ventricle 150/18.  Cardiac output 4.2 by thermodilution method.  Cardiac  index of 1.9 by thermodilution method.   ANGIOGRAPHIC FINDINGS:  1. There is calcification in the area of the left main coronary artery      although no obstructive stenosis is noted.  This vessel gives rise      to left anterior descending and circumflex vessels.  2. The left anterior descending is a moderate-to-large size vessel      wrapping the apex.  There are two larger diagonal branches and      three smaller diagonal branches noted along the course of the left      anterior descending.  There is 30% stenosis within the proximal      left anterior descending.  A fairly large proximal diagonal branch      is visualized with approximately 50% ostial stenosis.  Otherwise,  there are minor luminal irregularities within the left anterior      descending including 20% midvessel stenosis.  3. There is a medium caliber circumflex vessel with two obtuse      marginal branches.  No significant flow-limiting stenosis is noted.  4. Right coronary artery is medium caliber and dominant, supplying the      posterior descending branch.  There are minor luminal      irregularities including a more focal 30% stenosis within the mid      vessel.   Left ventriculography was performed in the RAO projection and revealed  ejection fraction of approximately 35% with diffuse hypokinesis and 2+  mitral regurgitation in the setting of ventricular ectopy.   Diagnoses  1. Mild coronary atherosclerosis as outlined including approximately      30% proximal left anterior descending stenosis and 30% mid-right      coronary  artery stenosis.  No obstructive disease is noted.  2. Mean pulmonary capillary wedge pressure of 19 with normal pulmonary      artery systolic pressure and a cardiac output of 4.2 by the      thermodilution method.  3. Left ventricular ejection fraction of approximately 35% with 2+      mitral regurgitation in the setting of ventricular ectopy and a      left ventricular end-diastolic pressure of 18 mmHg.   DISCUSSION:  I reviewed the results with the patient.  At this point I  would anticipate ongoing medical therapy and continued treatment of  hypothyroid state.  At this point there is no clear revascularization  option.      Satira Sark, MD  Electronically Signed     SGM/MEDQ  D:  05/13/2007  T:  05/14/2007  Job:  [id number]   cc:   Sandy Salaam. Shawna Orleans, DO  Satira Sark, MD

## 2010-08-16 NOTE — Assessment & Plan Note (Signed)
[facility] OFFICE NOTE   NAME:Beiser, LAMARRIA FLICK                        MRN:          [id number]  DATE:05/08/2007                            DOB:          1946-01-23    PRIMARY CARE PHYSICIAN:  Sandy Salaam. Shawna Orleans, DO   REASON FOR VISIT:  Followup cardiomyopathy.   HISTORY OF PRESENT ILLNESS:  I last saw Ms. Cossey in December.  She has a  history of a small to moderate pericardial effusion diagnosed by CT scan  of the chest with ultimate diagnosis of a cardiomyopathy.  This has been  in the setting of hypothyroidism, presently undergoing medication  adjustments.  She has also been treated with cardiac medications for her  myopathy.  She did have a Myoview scan done raising question of a small  anterior basal scar to the mid level with no clear associated ischemia.  Her ejection fraction was 28% by Myoview, but 40% by echo, although I  did have a repeat echocardiogram done just last month, which is more in  line with a Myoview study indicating ejection fraction of 25% with left  ventricular enlargement.  No description of pericardial effusion by that  particular study.  Symptomatically, Ms. Milke has NYHA class II dyspnea  on exertion, but no chest pain.  I reviewed the study results with her  and also her medications.  I still suspect that she may well have a  nonischemic cardiomyopathy, but given her degree of cardiomyopathy and  question of scar based on her Myoview we did talk about proceeding on to  a left and right heart catheterization to get better hemodynamic  assessment and clearly define her coronary anatomy.  She states she had  a cardiac catheterization approximately 30 years ago by Dr. Sherald Barge  and was told it was normal.  We discussed the procedure again including  the risks and benefits and she is in agreement to proceed for more clear  diagnosis.  She did have a recent blood work done indicating normal BUN,  creatinine of 15 and 1.1; normal potassium of 3.9; normal liver function  tests; LDL cholesterol of 164 and a TSH of 24.1.  This is improving on  Levoxyl.   ALLERGIES:  NO KNOWN DRUG ALLERGIES.   MEDICATIONS:  1. Levoxyl 175 mcg p.o. daily.  2. Aspirin 81 mg p.o. daily.  3. Coreg CR 20 mg p.o. daily.  4. Zestoretic 20/12.5 mg p.o. daily.  5. Digoxin 125 mcg p.o. daily.  6. Nexium 10 mg p.o. p.r.n.   REVIEW OF SYSTEMS:  As described in History of Present Illness. No frank  orthopnea or PND.  No lower extremity edema.  Otherwise, negative.   PHYSICAL EXAMINATION:  Blood pressure 135/84, heart rate in the 90s at  rest and regular, weight 209, pounds down from 216. The patient is  comfortable and in no acute distress.  HEENT:  Conjunctiva, lids normal.  Oropharynx clear.  NECK:  Supple.  No elevated jugular venous pressure. No loud bruits.  LUNGS:  Clear without labored breathing.  CARDIAC: Examination  reveals a regular rate and rhythm.  No obvious S3.  Soft systolic murmur at the base.  No pericardial rub.  ABDOMEN:  Soft, nontender with normal bowel sounds.  EXTREMITIES: Reveals no pitting edema.  Distal pulses are  2+.  SKIN:  Warm and dry.  MUSCULOSKELETAL: No kyphosis noted.  NEUROPSYCHIATRIC:  The patient alert and oriented x3.  Affect is  appropriate.   IMPRESSIONS AND RECOMMENDATIONS:  Cardiomyopathy with ejection fraction  25%.  I suspect that this is more than likely nonischemic and perhaps  still associated with her hypothyroid state, although this has been  improving and she is on medical therapy otherwise for her myopathy.  Her  Myoview suggest possibility of anterior scar and I think for a more  definitive diagnosis I will plan to proceed with a left and right heart  catheterization.  I have discussed risks, benefits of this with the  patient.  She is in agreement to proceed.  If her coronary anatomy is  reassuring, then we will continue to follow her on medical  therapy.  Otherwise, we can discuss revascularization options.  We will plan a  chest x-ray and CBC and coagulation levels in addition to her recent  blood work.  This will be scheduled through our outpatient cardiac  catheterization lab.     Satira Sark, MD  Electronically Signed    SGM/MedQ  DD: 05/08/2007  DT: 05/09/2007  Job #: [id number]   cc:   Sandy Salaam. Shawna Orleans, DO

## 2010-08-16 NOTE — Assessment & Plan Note (Signed)
[place] OFFICE NOTE   NAME:Stein, Paula ROUGHTON                        MRN:          [id number]  DATE:03/19/2007                            DOB:          July 19, 1945    PRIMARY CARE PHYSICIAN:  Paula Stein, [place] VISIT:  Follow up testing.   HISTORY OF PRESENT ILLNESS:  I saw [name] back in early November. She  was referred at that time with a small to moderate pericardial effusion  diagnosed by CT scan of the chest and ultimately diagnosis of a  cardiomyopathy with an ejection fraction 40% associated with inferior  hypokinesis by echocardiography. All of this was in the face of  hypothyroidism that is being treated at this time. I referred her for a  Myoview to assess for potential ischemia. This study reported an  ejection fraction of 28% although this was not corroborated by an  echocardiogram done in similar time. Perfusion imaging demonstrated  possible small scar affecting the anterior base at the mid level,  although no obvious ischemia was noted. Of note, this did not correlate  with the inferior wall motion abnormality noted on echocardiography, and  I still wonder about the possibility of a nonischemic cardiomyopathy  associated with the patient's hypothyroidism. She reports NYHA class II  dyspnea on exertion, no palpitations and no syncope. She continues to  deny any problems with anginal chest pain. Today, we reviewed her test  results, and I reviewed her medications. We talked about adding digoxin  and having a followup echocardiogram sometime in January. At this point,  we did not feel like proceeding on to a cardiac catheterization was  required.   ALLERGIES:  No known drug allergies.   PRESENT MEDICATIONS:  1. Levoxyl 175 mcg p.o. daily.  2. Zestoretic 20/12.5 p.o. daily.  3. Coreg CR 20 mg p.o. daily.  4. Aspirin 81 mg p.o. daily.   REVIEW OF SYSTEMS:  As described in the  history of present illness. All  others negative.   PHYSICAL EXAMINATION:  Blood pressure checked by me 112/78, heart rate  76. No obvious pulsus paradoxus. Weight 216 pounds, down from 224 last  visit. The patient comfortable in no acute distress.  HEENT:  Conjunctivae and lids normal. Oropharynx clear.  NECK:  Supple. No elevated jugular venous pressure. No loud bruits. No  thyromegaly is noted.  LUNGS:  Are clear without labored breathing.  CARDIAC EXAM:  Reveals a regular rate and rhythm. No pericardial rub. No  obvious S3 gallop.  ABDOMEN:  Obese, nontender. Normal active bowel sounds.  EXTREMITIES:  Exhibit no marked pitting edema.  SKIN:  Warm and dry.  MUSCULOSKELETAL:  No kyphosis is noted.  NEURO/PSYCHIATRIC:  The patient is alert and oriented x3. Affect is  normal.   IMPRESSION/RECOMMENDATIONS:  1. Previously diagnosed small to moderate pericardial effusion      associated with hypothyroidism most likely. The patient is      hemodynamically stable and denying any active chest pain or      progressive dyspnea. She  does not have an obvious pulsus paradoxus      on examination today. I recommend a followup echocardiogram in      January which we will arrange. Otherwise, she continues to have her      thyroid status evaluated by Dr. Shawna Stein.  2. 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 Information is somewhat      discordant in comparing her echocardiographic results with her      Myoview results. She is not reporting any active angina. Mid to      basal anterior scar was suggested on perfusion imaging although      this was not supported by her electrocardiogram or her      echocardiogram. She is on a very reasonable medical regimen to      which I will add digoxin 125 mg every other day. I will have her      follow up with me in the office over the next few months, and we      can determine if any further testing is required. We may ultimately      need to discuss cardiac  catheterization, particularly if her      cardiac function does not improve with ongoing therapy for      hypothyroidism, or if she develops any progressive symptomatology.  3. Further plans to follow.     Satira Sark, MD  Electronically Signed    SGM/MedQ  DD: 03/19/2007  DT: 03/20/2007  Job #: [id number]   cc:   Paula Salaam. Shawna Orleans, DO

## 2010-08-19 NOTE — H&P (Signed)
[facility]  Patient:    Paula Stein, Paula Stein Visit Number: [id number] MRN: [id number]          Service Type: EMS Location: ED Attending Physician:  Rafael Bihari Dictated by:   Elyse Jarvis Amedeo Plenty, M.D. Admit Date:  03/20/2001   CC:         Floyde Parkins, M.D.  Alyson Locket. Sammuel Cooper, M.D.   History and Physical  CHIEF COMPLAINT:  Nausea and vomiting.  HISTORY OF PRESENT ILLNESS:  The patient is a 65 year old black female who presents with a five-day history of persistent nausea, vomiting, and intermittent periumbilical abdominal pain.  She has kept down very little p.o. intake, mainly only fluids, since her symptoms began fairly acutely five days ago.  She was seen in the [facility] Room four days ago, and workup was unrevealing, but her symptoms continued.  She denies any hematemesis, diarrhea, abdominal distention, fever, chills, back pain, or dysuria.  She has had very little stool output since her symptoms began.  She has had a colonoscopy in 1998, and, reportedly, had a peptic ulcer by EGD approximately 10 years ago.  She takes Zantac chronically for this.  PAST MEDICAL HISTORY: 1. 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 2. Reported peptic ulcer disease and hiatal hernia. 3. Hypertension.  PAST SURGICAL HISTORY: 1. Thyroid goiter removed 20 years ago. 2. Abdominal hysterectomy without oophorectomy 30 years ago.  SOCIAL HISTORY:  The patient smoked cigarettes until a week ago.  She denies alcohol use.  FAMILY HISTORY:  Mother is alive with diabetes and also had colon cancer.  She has no siblings.  Father died of accidental causes.  MEDICATIONS: 1. Toprol XL 100 mg a day. 2. Zantac 150 mg b.i.d. 3. Lisinopril 5 mg a day.  PHYSICAL EXAMINATION:  GENERAL:  Well-developed, well-nourished white female in mild distress with intermittent abdominal cramping.  VITAL SIGNS:  Blood pressure 171/108, pulse 114,  respirations 18, temperature 97.4.  HEENT:  Unremarkable.  NECK:  Supple.  HEART:  Regular rate and rhythm.  Without murmur.  LUNGS:  Clear.  ABDOMEN:  Soft.  Nondistended.  With hyperactive bowel sounds.  No hepatosplenomegaly, mass, or guarding.  LABORATORY DATA:  Flat and upright abdominal film showed nonspecific bowel gas pattern.  CBC, CMET, and amylase essentially within normal limits.  IMPRESSION:  Persistent nausea and vomiting of five days duration, etiology unclear.  Does not sound like viral gastroenteritis, and wonder about possible pyloric stenosis or partial small-bowel obstruction.  PLAN:  Will admit.  Treat pain and nausea symptomatically, and begin workup with EGD in the morning. Dictated by:   Elyse Jarvis Amedeo Plenty, M.D. Attending Physician:  Rafael Bihari DD:  03/20/01 TD:  03/21/01 Job: [id number]

## 2010-08-19 NOTE — Procedures (Signed)
[facility]  Patient:    Paula Stein, Paula Stein Visit Number: [id number] MRN: [id number]          Service Type: END Location: ENDO Attending Physician:  Rafael Bihari Dictated by:   Elyse Jarvis Amedeo Plenty, M.D. Proc. Date: 07/15/01 Admit Date:  07/15/2001                             Procedure Report  PROCEDURE:  Colonoscopy with polypectomy.  INDICATIONS FOR PROCEDURE:  Family history of colon cancer in a first degree relative.  DESCRIPTION OF PROCEDURE:  The patient was placed in the left lateral decubitus position then placed on the pulse monitor with continuous low flow oxygen delivered by nasal cannula. She was sedated with 60 mg IV Demerol and 6 mg IV Versed. The Olympus video colonoscope was inserted into the rectum and advanced to the cecum, confirmed by transillumination at McBurneys point and visualization of the ileocecal valve and appendiceal orifice. The prep was good. The cecum revealed a small 6 mm polyp which was fulgurated by hot biopsy. There were other similar polyps scattered throughout the colon all sessile and less than 8 mm in diameter. Approximately eight of these were fulgurated by hot biopsy, some fulgurated with the closed tip and no tissue retrieved. The polyps were most concentrated in the sigmoid colon. There were also sigmoid and descending colon diverticula noted. The rectum appeared normal down to the anus where there were some small internal hemorrhoids seen. The colonoscope was then withdrawn and the patient returned to the recovery room in stable condition. The patient tolerated the procedure well and there were no immediate complications.  IMPRESSION:  1. Several scattered small sessile polyps fulgurated by hot biopsy.  2. Diverticulosis.  3. Internal hemorrhoids.  PLAN:  Await biopsy results and probably pursue repeat colonoscopy in three years. Dictated by:   Elyse Jarvis Amedeo Plenty, M.D. Attending Physician:  Rafael Bihari DD:  07/15/01 TD:  07/15/01 Job: [id number]

## 2010-08-19 NOTE — Op Note (Signed)
[facility]  Patient:    Paula Stein, Paula Stein Visit Number: [id number] MRN: [id number]          Service Type: Attending:  Elyse Jarvis. Amedeo Plenty, M.D. Dictated by:   Elyse Jarvis Amedeo Plenty, M.D. Proc. Date: 03/21/01   CC:         Alyson Locket. Sammuel Cooper, M.D.  Floyde Parkins, M.D.   Operative Report  PROCEDURE:  Esophagogastroduodenoscopy.  INDICATION FOR PROCEDURE:  Nausea and vomiting of four to five days duration.  DESCRIPTION OF PROCEDURE:  The patient was placed in the left lateral decubitus position and placed on the pulse monitor with continuous low flow oxygen delivered by nasal cannula. She was sedated with 50 mg IV Demerol and 6 mg IV Versed. The Olympus Endoscope was advanced under direct vision into the oropharynx and the esophagus. The esophagus was straightened and of normal caliber with the squamocolumnar line at 38 cm. There was no visible hiatal hernia, rings, stricture, or other abnormality to the GE junction. The stomach was entered and a small amount of liquid secretions were suctioned from the fundus. Retroflex view of the cardia was unremarkable. The fundus, body, and proximal antrum appeared normal. Through the distal antrum, there was some erythema and granularity consistent with mild gastritis. The pylorus was minimally deformed and easily allowed passage of the endoscope tip into the duodenum. There was a clean-based duodenal bulb ulcer with no stigma of hemorrhage and no significant narrowing of duodenal bulb at the area. There was also some bulbar and postbulbar duodenitis manifest by some edema, erythema, and granularity. The scope was withdrawn back into the stomach and a CLO test obtained. The scope was then withdrawn and the patient returned to the recovery room in stable condition. She tolerated the procedure well and there were no immediate complications.  IMPRESSION:  Duodenal ulcer.  PLAN:  Await CLO test and will treat for eradication of  Helicobacter if positive; otherwise, treat with proton pump inhibitor.Dictated by:   Elyse Jarvis Amedeo Plenty, M.D. Attending:  Elyse Jarvis. Amedeo Plenty, M.D. DD:  03/21/01 TD:  03/22/01 Job: [id number]

## 2010-08-25 ENCOUNTER — Other Ambulatory Visit: Payer: Self-pay | Admitting: Internal Medicine

## 2010-08-25 NOTE — Telephone Encounter (Signed)
Med list says synthroid is 174mcg once a day. Pharmacy directions are 151mcg 2 tablets daily. Please advise which directions are correct and how many refills to give?

## 2010-08-30 ENCOUNTER — Other Ambulatory Visit: Payer: Self-pay | Admitting: *Deleted

## 2010-08-30 MED ORDER — LEVOTHYROXINE SODIUM 200 MCG PO TABS: 200.0000 ug | ORAL_TABLET | Freq: Every day | ORAL | Status: DC

## 2010-08-30 NOTE — Telephone Encounter (Signed)
I would like to decrease her thyroid medication from 147mcg two tabs daily to 224mcg one tab daily.  She should repeat TSH in 1 month.  Needs to be seen for wrist pain.

## 2010-08-30 NOTE — Telephone Encounter (Signed)
Patient called and left voice message requesting refill on Synthroid, and a Rx for wrist pain. Her message states she has had pain from her wrist to her elbow and thinks that it could be tendonitis.

## 2010-08-31 ENCOUNTER — Encounter: Payer: Self-pay | Admitting: Internal Medicine

## 2010-08-31 NOTE — Telephone Encounter (Signed)
Call placed to patient at [phone], she was informed per [facility]'s instructions, and has verbalized understanding and agrees as instructed

## 2010-09-01 ENCOUNTER — Ambulatory Visit: Payer: Medicare Other | Admitting: Family Medicine

## 2010-09-01 ENCOUNTER — Ambulatory Visit (INDEPENDENT_AMBULATORY_CARE_PROVIDER_SITE_OTHER): Payer: Medicare Other | Admitting: Family Medicine

## 2010-09-01 ENCOUNTER — Encounter: Payer: Self-pay | Admitting: Family Medicine

## 2010-09-01 VITALS — BP 134/82 | HR 72 | Temp 97.4°F | Resp 18 | Wt 217.0 lb

## 2010-09-01 DIAGNOSIS — M654 Radial styloid tenosynovitis [de Quervain]: Secondary | ICD-10-CM | POA: Insufficient documentation

## 2010-09-01 MED ORDER — WRIST SPLINT MISC: Status: DC

## 2010-09-01 NOTE — Assessment & Plan Note (Signed)
Right side. Discussed dx, encouraged rest and 1 more week of tid alleve (2 tabs per dose)--with food. Also rx'd thumb spica splint to wear 24/7 x 1wk, then 12h per day for 7d, then prn x 7d.  Recheck in office in 3 wks.

## 2010-09-01 NOTE — Progress Notes (Signed)
OFFICE NOTE  09/01/2010  CC:  Chief Complaint  Patient presents with  . Wrist Pain    Pt has had intermittent right wrist pain x 1 month. Pain is worsening and now has difficulty gripping objects. Can't take Ibuprofen and Aleve has not helped.     HPI:   Patient is a 65 y.o. African-American female who is here for right wrist pain. Onset about 53mo ago, gradually progressing.  No injury or particularly repetitive motion.  She is right handed.  Hurts to grip so much lately that it feels weak.  Occ numbness/sleepy feeling along right thumb and medial right wrist area.  No other areas of pain, no swelling noted.  No past hist of similar.  Pertinent PMH:  HTN DM 2 Hypothyroidism MEDS;   Outpatient Prescriptions Prior to Visit  Medication Sig Dispense Refill  . amLODipine (NORVASC) 10 MG tablet Take 10 mg by mouth daily.        Marland Kitchen aspirin 81 MG tablet Take 81 mg by mouth daily.        . carvedilol (COREG) 25 MG tablet Take 25 mg by mouth 2 (two) times daily with a meal.        . glucose blood (ONE TOUCH TEST STRIPS) test strip Use as instructed to check blood sugar once a day       . levothyroxine (SYNTHROID, LEVOTHROID) 200 MCG tablet Take 1 tablet (200 mcg total) by mouth daily.  30 tablet  1  . losartan-hydrochlorothiazide (HYZAAR) 100-25 MG per tablet Take 1 tablet by mouth daily.        . metFORMIN (GLUCOPHAGE) 500 MG tablet Take 1 tablet (500 mg total) by mouth 2 (two) times daily with a meal.  180 tablet  1  . Multiple Vitamins-Minerals (CENTRUM SILVER PO) Take by mouth daily.        . rosuvastatin (CRESTOR) 20 MG tablet Take 20 mg by mouth daily.          PE: Blood pressure 134/82, pulse 72, temperature 97.4 F (36.3 C), temperature source Oral, resp. rate 18, weight 217 lb 0.6 oz (98.449 kg). Gen: Alert, well appearing.  Patient is oriented to person, place, time, and situation. Wrists: no swelling, erythema, or warmth.  Right wrist mildly TTP over extensor area of thumb.   Wrist tender only where thumb extensor is.  +Pain with finkelstein's maneuver.  No weakness or sensory deficit.  IMPRESSION AND PLAN:  DeQuervain's disease (tenosynovitis) Right side. Discussed dx, encouraged rest and 1 more week of tid alleve (2 tabs per dose)--with food. Also rx'd thumb spica splint to wear 24/7 x 1wk, then 12h per day for 7d, then prn x 7d.  Recheck in office in 3 wks.     FOLLOW UP:  Return in about 3 weeks (around 09/22/2010).

## 2010-09-20 ENCOUNTER — Other Ambulatory Visit: Payer: Medicare Other

## 2010-09-20 ENCOUNTER — Other Ambulatory Visit: Payer: Self-pay | Admitting: Internal Medicine

## 2010-09-20 DIAGNOSIS — E039 Hypothyroidism, unspecified: Secondary | ICD-10-CM

## 2010-09-29 ENCOUNTER — Ambulatory Visit: Payer: Medicare Other | Admitting: Internal Medicine

## 2010-10-14 ENCOUNTER — Telehealth: Payer: Self-pay | Admitting: Internal Medicine

## 2010-10-14 DIAGNOSIS — I10 Essential (primary) hypertension: Secondary | ICD-10-CM

## 2010-10-14 DIAGNOSIS — E039 Hypothyroidism, unspecified: Secondary | ICD-10-CM

## 2010-10-14 DIAGNOSIS — E119 Type 2 diabetes mellitus without complications: Secondary | ICD-10-CM

## 2010-10-14 MED ORDER — METFORMIN HCL 500 MG PO TABS: 500.0000 mg | ORAL_TABLET | Freq: Two times a day (BID) | ORAL | Status: DC

## 2010-10-14 MED ORDER — LEVOTHYROXINE SODIUM 200 MCG PO TABS: 200.0000 ug | ORAL_TABLET | Freq: Every day | ORAL | Status: DC

## 2010-10-14 MED ORDER — LOSARTAN POTASSIUM-HCTZ 100-25 MG PO TABS: 1.0000 | ORAL_TABLET | Freq: Every day | ORAL | Status: DC

## 2010-10-14 MED ORDER — CARVEDILOL 25 MG PO TABS: 25.0000 mg | ORAL_TABLET | Freq: Two times a day (BID) | ORAL | Status: DC

## 2010-10-14 NOTE — Telephone Encounter (Signed)
Refill- metformin hcl tabs 500mg . 90 day supply. Refills 4  Refill- losartan/hctz tabs. Strength 100/25. 90 day supply. Refills 4  Refill- carvedilol tabs 25mg . 90 days supply. Refills 4  Refill- L-thyroxine(synthroid) tab 220mcg. 90 day supply. Refills 4

## 2010-10-14 NOTE — Telephone Encounter (Signed)
Advised pt I would send 90 day supply, but she would have to keep appt on 8/10 for more refills.  She is agreeable.  Dr. Elizebeth Koller will not be in on 8.10, Pleas Koch will call pt to reschedule.  RX sent.

## 2010-11-02 ENCOUNTER — Telehealth: Payer: Self-pay | Admitting: Internal Medicine

## 2010-11-02 DIAGNOSIS — E119 Type 2 diabetes mellitus without complications: Secondary | ICD-10-CM

## 2010-11-02 DIAGNOSIS — E890 Postprocedural hypothyroidism: Secondary | ICD-10-CM

## 2010-11-02 NOTE — Telephone Encounter (Signed)
Patient wants to know if she should do lab work prior to 8.13.12 appt?

## 2010-11-03 NOTE — Telephone Encounter (Signed)
Orders placed for TSH and Bmet per 08/11/10 office note. Pt has been notified and states she will go to [facility] on [address] for her labs. Spoke to [place] at [address] and scheduled labs for 11/07/10.

## 2010-11-07 ENCOUNTER — Other Ambulatory Visit: Payer: Medicare Other | Admitting: *Deleted

## 2010-11-08 ENCOUNTER — Ambulatory Visit (INDEPENDENT_AMBULATORY_CARE_PROVIDER_SITE_OTHER): Payer: Medicare Other | Admitting: *Deleted

## 2010-11-08 DIAGNOSIS — I10 Essential (primary) hypertension: Secondary | ICD-10-CM

## 2010-11-08 DIAGNOSIS — I428 Other cardiomyopathies: Secondary | ICD-10-CM

## 2010-11-08 DIAGNOSIS — E039 Hypothyroidism, unspecified: Secondary | ICD-10-CM

## 2010-11-08 DIAGNOSIS — I70219 Atherosclerosis of native arteries of extremities with intermittent claudication, unspecified extremity: Secondary | ICD-10-CM

## 2010-11-08 DIAGNOSIS — I5022 Chronic systolic (congestive) heart failure: Secondary | ICD-10-CM

## 2010-11-08 DIAGNOSIS — I319 Disease of pericardium, unspecified: Secondary | ICD-10-CM

## 2010-11-08 DIAGNOSIS — E785 Hyperlipidemia, unspecified: Secondary | ICD-10-CM

## 2010-11-08 LAB — BASIC METABOLIC PANEL
BUN: 13 mg/dL (ref 6–23)
Chloride: 105 mEq/L (ref 96–112)
Creatinine, Ser: 1 mg/dL (ref 0.4–1.2)
GFR: 74.49 mL/min (ref >60.00)
Glucose, Bld: 110 mg/dL — ABNORMAL HIGH (ref 70–99)

## 2010-11-08 LAB — TSH: TSH: 0.09 u[IU]/mL — ABNORMAL LOW (ref 0.35–5.50)

## 2010-11-11 ENCOUNTER — Ambulatory Visit: Payer: Medicare Other | Admitting: Internal Medicine

## 2010-11-14 ENCOUNTER — Encounter: Payer: Self-pay | Admitting: Internal Medicine

## 2010-11-14 ENCOUNTER — Encounter: Payer: Self-pay | Admitting: Cardiovascular Disease

## 2010-11-14 ENCOUNTER — Ambulatory Visit (INDEPENDENT_AMBULATORY_CARE_PROVIDER_SITE_OTHER): Payer: Medicare Other | Admitting: Internal Medicine

## 2010-11-14 DIAGNOSIS — E876 Hypokalemia: Secondary | ICD-10-CM

## 2010-11-14 DIAGNOSIS — E785 Hyperlipidemia, unspecified: Secondary | ICD-10-CM

## 2010-11-14 DIAGNOSIS — E039 Hypothyroidism, unspecified: Secondary | ICD-10-CM

## 2010-11-14 MED ORDER — ATORVASTATIN CALCIUM 40 MG PO TABS: 40.0000 mg | ORAL_TABLET | Freq: Every day | ORAL | Status: DC

## 2010-11-14 MED ORDER — LEVOTHYROXINE SODIUM 175 MCG PO TABS: 175.0000 ug | ORAL_TABLET | Freq: Every day | ORAL | Status: DC

## 2010-11-14 MED ORDER — POTASSIUM CHLORIDE CRYS ER 20 MEQ PO TBCR: 20.0000 meq | EXTENDED_RELEASE_TABLET | Freq: Two times a day (BID) | ORAL | Status: DC

## 2010-11-14 NOTE — Patient Instructions (Signed)
Please schedule tsh/free t4 (hypothyroidism) A1c, chem7, urine microalbumin (250.0) and lipid/lft (272.4) prior to next visit

## 2010-11-16 DIAGNOSIS — E876 Hypokalemia: Secondary | ICD-10-CM | POA: Insufficient documentation

## 2010-11-16 NOTE — Assessment & Plan Note (Signed)
Decrease Synthroid dose. Repeat TSH and free T4 approximately 10 weeks.

## 2010-11-16 NOTE — Progress Notes (Signed)
  Subjective:    Patient ID: Paula Stein, female    DOB: 08-12-1945, 65 y.o.   MRN: [id number]  HPI patient presents to clinic for followup of multiple medical problems. Since thyroid medication dose increased has developed loose stools. Duration approximately 3 weeks ago. Tolerate statin therapy without mild as were abnormal LFTs but finds Crestor expensive. Continues to smoke tobacco and we did discuss cessation for approximately 6 minutes. Reviewed Chem-7 with mildly depressed potassium and decreased TSH. No other complaints.  Reviewed past medical history, medications and allergies    Review of Systems see history of present illness     Objective:   Physical Exam  Nursing note and vitals reviewed. Constitutional: She appears well-developed and well-nourished. No distress.  HENT:  Head: Normocephalic and atraumatic.  Eyes: Conjunctivae are normal. No scleral icterus.  Neck: Neck supple. No thyromegaly present.  Cardiovascular: Normal rate, regular rhythm and normal heart sounds.  Exam reveals no gallop and no friction rub.   No murmur heard. Pulmonary/Chest: Effort normal and breath sounds normal. No respiratory distress. She has no wheezes. She has no rales.  Neurological: She is alert.  Skin: Skin is warm and dry. She is not diaphoretic.  Psychiatric: She has a normal mood and affect.          Assessment & Plan:

## 2010-11-16 NOTE — Assessment & Plan Note (Signed)
Mild and asymptomatic. Begin K-Dur. repeat Chem-7 with next visit

## 2010-11-16 NOTE — Assessment & Plan Note (Signed)
Change Crestor to Lipitor for cost consideration. Obtain fasting lipid profile and liver function tests prior to next visit

## 2010-11-24 ENCOUNTER — Other Ambulatory Visit: Payer: Self-pay | Admitting: Internal Medicine

## 2010-11-24 DIAGNOSIS — E039 Hypothyroidism, unspecified: Secondary | ICD-10-CM

## 2010-11-24 DIAGNOSIS — E119 Type 2 diabetes mellitus without complications: Secondary | ICD-10-CM

## 2010-11-24 DIAGNOSIS — E785 Hyperlipidemia, unspecified: Secondary | ICD-10-CM

## 2010-12-08 ENCOUNTER — Ambulatory Visit: Payer: Medicare Other | Admitting: Cardiovascular Disease

## 2010-12-23 LAB — POCT I-STAT 3, ART BLOOD GAS (G3+)
Bicarbonate: 24.5 — ABNORMAL HIGH
O2 Saturation: 94
TCO2: 26
pCO2 arterial: 39.7
pH, Arterial: 7.399

## 2010-12-23 LAB — POCT I-STAT 3, VENOUS BLOOD GAS (G3P V)
Acid-base deficit: 3 — ABNORMAL HIGH
Bicarbonate: 22.7
O2 Saturation: 53
Operator id: 221371
pCO2, Ven: 43.3 — ABNORMAL LOW
pO2, Ven: 30

## 2011-01-02 ENCOUNTER — Other Ambulatory Visit: Payer: Medicare Other

## 2011-01-09 ENCOUNTER — Ambulatory Visit: Payer: Medicare Other | Admitting: Internal Medicine

## 2011-01-11 LAB — BASIC METABOLIC PANEL
BUN: 14
CO2: 28
Calcium: 9.2
Chloride: 104
Creatinine, Ser: 1.26 — ABNORMAL HIGH
GFR calc Af Amer: 52 — ABNORMAL LOW

## 2011-01-11 LAB — URINALYSIS, ROUTINE W REFLEX MICROSCOPIC
Glucose, UA: NEGATIVE
Ketones, ur: NEGATIVE
Leukocytes, UA: NEGATIVE
Protein, ur: >300 — AB
Urobilinogen, UA: 0.2

## 2011-01-11 LAB — CBC
HCT: 34.5 — ABNORMAL LOW
Platelets: 300
RDW: 13.6
WBC: 8.3

## 2011-01-11 LAB — TSH: TSH: 87.221 — ABNORMAL HIGH

## 2011-01-11 LAB — DIFFERENTIAL
Basophils Absolute: 0
Lymphocytes Relative: 35
Lymphs Abs: 2.9
Neutro Abs: 4.8
Neutrophils Relative %: 58

## 2011-01-11 LAB — URINE MICROSCOPIC-ADD ON

## 2011-01-11 LAB — POCT CARDIAC MARKERS
Myoglobin, poc: 207
Operator id: 4295

## 2011-01-11 LAB — URINE CULTURE: Colony Count: >=100000

## 2011-01-11 LAB — T4, FREE: Free T4: 0.36 — ABNORMAL LOW

## 2011-01-11 LAB — D-DIMER, QUANTITATIVE: D-Dimer, Quant: 1.47 — ABNORMAL HIGH

## 2011-02-01 ENCOUNTER — Encounter: Payer: Medicare Other | Admitting: Internal Medicine

## 2011-02-15 ENCOUNTER — Encounter: Payer: Self-pay | Admitting: Internal Medicine

## 2011-02-15 ENCOUNTER — Ambulatory Visit (INDEPENDENT_AMBULATORY_CARE_PROVIDER_SITE_OTHER): Payer: Medicare Other | Admitting: Internal Medicine

## 2011-02-15 VITALS — BP 176/100 | HR 90 | Temp 98.2°F | Ht 69.5 in | Wt 224.0 lb

## 2011-02-15 DIAGNOSIS — E785 Hyperlipidemia, unspecified: Secondary | ICD-10-CM

## 2011-02-15 DIAGNOSIS — E876 Hypokalemia: Secondary | ICD-10-CM

## 2011-02-15 DIAGNOSIS — I10 Essential (primary) hypertension: Secondary | ICD-10-CM

## 2011-02-15 DIAGNOSIS — E1142 Type 2 diabetes mellitus with diabetic polyneuropathy: Secondary | ICD-10-CM

## 2011-02-15 DIAGNOSIS — E039 Hypothyroidism, unspecified: Secondary | ICD-10-CM

## 2011-02-15 DIAGNOSIS — Z23 Encounter for immunization: Secondary | ICD-10-CM

## 2011-02-15 DIAGNOSIS — E119 Type 2 diabetes mellitus without complications: Secondary | ICD-10-CM

## 2011-02-15 LAB — LIPID PANEL
Cholesterol: 271 mg/dL — ABNORMAL HIGH (ref 0–200)
HDL: 59.9 mg/dL (ref >39.00)
Triglycerides: 137 mg/dL (ref 0.0–149.0)
VLDL: 27.4 mg/dL (ref 0.0–40.0)

## 2011-02-15 LAB — LDL CHOLESTEROL, DIRECT: Direct LDL: 195.2 mg/dL

## 2011-02-15 LAB — CBC WITH DIFFERENTIAL/PLATELET
Basophils Relative: 0.4 % (ref 0.0–3.0)
Eosinophils Absolute: 0.1 10*3/uL (ref 0.0–0.7)
Eosinophils Relative: 1.1 % (ref 0.0–5.0)
HCT: 44.6 % (ref 36.0–46.0)
Lymphs Abs: 3.6 10*3/uL (ref 0.7–4.0)
MCHC: 32.3 g/dL (ref 30.0–36.0)
MCV: 94.6 fl (ref 78.0–100.0)
Monocytes Absolute: 0.5 10*3/uL (ref 0.1–1.0)
Neutrophils Relative %: 47.7 % (ref 43.0–77.0)
Platelets: 232 10*3/uL (ref 150.0–400.0)
RBC: 4.71 Mil/uL (ref 3.87–5.11)
WBC: 8.2 10*3/uL (ref 4.5–10.5)

## 2011-02-15 LAB — HEPATIC FUNCTION PANEL
Albumin: 4.1 g/dL (ref 3.5–5.2)
Total Protein: 8 g/dL (ref 6.0–8.3)

## 2011-02-15 LAB — BASIC METABOLIC PANEL
BUN: 14 mg/dL (ref 6–23)
Calcium: 9.5 mg/dL (ref 8.4–10.5)
Creatinine, Ser: 1.3 mg/dL — ABNORMAL HIGH (ref 0.4–1.2)
GFR: 52.93 mL/min — ABNORMAL LOW (ref >60.00)
Glucose, Bld: 92 mg/dL (ref 70–99)

## 2011-02-15 LAB — TSH: TSH: 12.86 u[IU]/mL — ABNORMAL HIGH (ref 0.35–5.50)

## 2011-02-15 MED ORDER — AMLODIPINE BESYLATE 10 MG PO TABS: 10.0000 mg | ORAL_TABLET | Freq: Every day | ORAL | Status: DC

## 2011-02-15 MED ORDER — CARVEDILOL 25 MG PO TABS: 25.0000 mg | ORAL_TABLET | Freq: Two times a day (BID) | ORAL | Status: DC

## 2011-02-15 MED ORDER — ATORVASTATIN CALCIUM 40 MG PO TABS: 40.0000 mg | ORAL_TABLET | Freq: Every day | ORAL | Status: DC

## 2011-02-15 MED ORDER — LOSARTAN POTASSIUM-HCTZ 100-25 MG PO TABS: 1.0000 | ORAL_TABLET | Freq: Every day | ORAL | Status: DC

## 2011-02-15 MED ORDER — METFORMIN HCL 500 MG PO TABS: 500.0000 mg | ORAL_TABLET | Freq: Two times a day (BID) | ORAL | Status: DC

## 2011-02-15 NOTE — Assessment & Plan Note (Signed)
Patient reports good medication compliance. Monitor TFTs.

## 2011-02-15 NOTE — Assessment & Plan Note (Signed)
Patient switched from Crestor to Lipitor due to financial reasons. Monitor fasting lipid profile and LFTs. LDL goal less than 70.

## 2011-02-15 NOTE — Assessment & Plan Note (Signed)
Patient reports following fairly healthy diet and taking her metformin. Monitor A1c. Patient updated with influenza vaccine. She is due for annual eye exam. Refer to Dr. Gershon Crane. I stressed the importance of blood pressure control.

## 2011-02-15 NOTE — Assessment & Plan Note (Signed)
Poorly controlled. Patient ran out of Norvasc 2 weeks ago. I stressed the importance of medication compliance. Monitor basic metabolic panel.

## 2011-02-15 NOTE — Patient Instructions (Signed)
Take your carvedilol with food

## 2011-02-15 NOTE — Progress Notes (Signed)
Subjective:    Patient ID: Paula Stein, female    DOB: 1945/04/08, 65 y.o.   MRN: [id number]  HPI  A 64 year old [place] American female with history of PAD, diabetes mellitus type 2, hypertension and hyperlipidemia for routine followup. Overall patient has been doing well. She ran out of her Norvasc 3 weeks ago.  She has been monitoring her blood sugar at home and they have been within normal range.  She complains of intermittent numbness and tingling in her hands and feet. She has been taking lipitor faithfully.   crestor was cost prohibitive.  She reports increased stress. 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  She had labs drawn this morning that are not available for review.  Review of Systems  Constitutional: Negative for activity change, appetite change and unexpected weight change.  Eyes: Negative for visual disturbance.  Respiratory: Negative for cough, chest tightness and shortness of breath.   Cardiovascular: Negative for chest pain.  Genitourinary: Negative for difficulty urinating.  Neurological: Negative for headaches.  Gastrointestinal: Negative for abdominal pain, heartburn melena or hematochezia Psych: Negative for depression or anxiety  Past Medical History  Diagnosis Date  . Hypertension   . Personal history of goiter   . S/P thyroidectomy   . S/P radioactive iodine thyroid ablation   . Iatrogenic thyroiditis   . Non-ischemic cardiomyopathy     EF 28%- reassessment of LV function 2011 with LVEf 45-50%  . History of cardiac cath 05/2007    normal-with patent coronaries  . PAD (peripheral artery disease)     lower extremities with ABIs of 0.5 bilaterally  . History of colonoscopy     History   Social History  . Marital Status: Divorced    Spouse Name: N/A    Number of Children: 1  . Years of Education: N/A   Occupational History  . Retired     Education officer, museum   Social History Main Topics  . Smoking status: Current  Everyday Smoker  . Smokeless tobacco: Not on file  . Alcohol Use: No  . Drug Use: No  . Sexually Active: Not on file   Other Topics Concern  . Not on file   Social History Narrative   Retired school teacherDivorced - one grown soncurrent smoker Alcohol use-no   Drug use-no Regular exercise-yes     son - Mikalee Bungard    Past Surgical History  Procedure Date  . Total abdominal hysterectomy   . Thyroidectomy   . Tonsillectomy     Family History  Problem Relation Age of Onset  . Diabetes type II Mother   . Hypertension Mother   . Diabetes Mother   . Other Father     deceased from accident age 74    Allergies  Allergen Reactions  . Codeine     Current Outpatient Prescriptions on File Prior to Visit  Medication Sig Dispense Refill  . aspirin 81 MG tablet Take 81 mg by mouth daily.        Marland Kitchen glucose blood (ONE TOUCH TEST STRIPS) test strip Use as instructed to check blood sugar once a day       . levothyroxine (SYNTHROID) 175 MCG tablet Take 1 tablet (175 mcg total) by mouth daily.  30 tablet  6  . DISCONTD: carvedilol (COREG) 25 MG tablet Take 1 tablet (25 mg total) by mouth 2 (two) times daily with a meal.  180 tablet  0  . DISCONTD: losartan-hydrochlorothiazide (HYZAAR) 100-25 MG  per tablet Take 1 tablet by mouth daily.  90 tablet  0    BP 176/100  Pulse 90  Temp(Src) 98.2 F (36.8 C) (Oral)  Ht 5' 9.5" (1.765 m)  Wt 224 lb (101.606 kg)  BMI 32.60 kg/m2       Objective:   Physical Exam   Constitutional: Appears well-developed and well-nourished. No distress.  Head: Normocephalic and atraumatic.  Ear:  Right and left ear normal.  TMs clear.  Hearing is grossly normal Mouth/Throat: Oropharynx is clear and moist.  Eyes: Conjunctivae are normal. Pupils are equal, round, and reactive to light.  Neck: Normal range of motion. Neck supple. No thyromegaly present. No carotid bruit Cardiovascular: Normal rate, regular rhythm and normal heart sounds.  Exam reveals no  gallop and no friction rub.  No murmur heard. Pulmonary/Chest: Effort normal and breath sounds normal.  No wheezes. No rales.  Abdominal: Soft. Bowel sounds are normal. No mass. There is no tenderness.  Neurological: Alert. No cranial nerve deficit.  Skin: Skin is warm and dry.  Psychiatric: Normal mood and affect. Behavior is normal.  Foot: No cracks or fissures. She has decreased sensation to vibration bilaterally. She has diminished pedis dorsalis and posterior tibial pulses.     Assessment & Plan:

## 2011-02-15 NOTE — Progress Notes (Signed)
Addended by: Joyce Gross R on: 02/15/2011 04:16 PM   Modules accepted: Orders

## 2011-02-15 NOTE — Assessment & Plan Note (Addendum)
Polyneuropathy associated with diabetes. Patient advised to monitor her feet daily.  Her neuropathy is not painful.

## 2011-02-15 NOTE — Progress Notes (Signed)
Addended by: Joyce Gross R on: 02/15/2011 04:25 PM   Modules accepted: Orders

## 2011-02-16 LAB — MICROALBUMIN / CREATININE URINE RATIO: Microalb Creat Ratio: 7.3 mg/g (ref 0.0–30.0)

## 2011-02-16 LAB — HEMOGLOBIN A1C: Hgb A1c MFr Bld: 6.8 % — ABNORMAL HIGH (ref 4.6–6.5)

## 2011-02-27 ENCOUNTER — Other Ambulatory Visit: Payer: Self-pay | Admitting: Internal Medicine

## 2011-02-27 MED ORDER — LEVOTHYROXINE SODIUM 200 MCG PO TABS: 200.0000 ug | ORAL_TABLET | Freq: Every day | ORAL | Status: DC

## 2011-02-27 NOTE — Progress Notes (Signed)
Call pt - she needs to increase her thyroid medication back to 200 mcg daily.  Make sure she is taking on empty stomach first thing in the AM.  Schedule repeat TSH in 6 weeks 244.9 It is very important that pt take her cholesterol medication regularly as directed.

## 2011-02-28 NOTE — Progress Notes (Signed)
Pt aware.

## 2011-02-28 NOTE — Progress Notes (Signed)
Lm to call back

## 2011-03-06 ENCOUNTER — Encounter: Payer: Self-pay | Admitting: Internal Medicine

## 2011-03-06 ENCOUNTER — Telehealth: Payer: Self-pay | Admitting: Internal Medicine

## 2011-03-06 NOTE — Telephone Encounter (Signed)
Please confirm pt taking 200 mcg of thyroid medication and pt has restarted taking cholesterol medication.

## 2011-03-07 NOTE — Telephone Encounter (Signed)
Pt has restarted the 200 mcg

## 2011-04-19 ENCOUNTER — Ambulatory Visit (INDEPENDENT_AMBULATORY_CARE_PROVIDER_SITE_OTHER): Payer: Medicare Other | Admitting: Internal Medicine

## 2011-04-19 DIAGNOSIS — J40 Bronchitis, not specified as acute or chronic: Secondary | ICD-10-CM

## 2011-04-19 MED ORDER — CEFUROXIME AXETIL 500 MG PO TABS: 500.0000 mg | ORAL_TABLET | Freq: Two times a day (BID) | ORAL | Status: AC

## 2011-04-19 MED ORDER — HYDROCODONE-HOMATROPINE 5-1.5 MG/5ML PO SYRP: 5.0000 mL | ORAL_SOLUTION | Freq: Two times a day (BID) | ORAL | Status: AC | PRN

## 2011-04-19 NOTE — Patient Instructions (Signed)
Please call our office if your symptoms do not improve or gets worse.  

## 2011-04-19 NOTE — Assessment & Plan Note (Signed)
66 year old [place] American female with signs and symptoms of bronchitis. Treat with cefuroxime 500 mg twice a day x10 days. Use Hycodan for cough as needed.  Patient advised to call office if symptoms persist or worsen.

## 2011-04-19 NOTE — Progress Notes (Signed)
Subjective:    Patient ID: Paula Stein, female    DOB: 1946/02/15, 66 y.o.   MRN: [id number]  URI  This is a new problem. The current episode started in the past 7 days. The problem has been gradually worsening. There has been no fever. Associated symptoms include congestion, coughing and a sore throat. She has tried nothing for the symptoms.   Her cough is productive of greenish sputum. She quit smoking in January.    Review of Systems  HENT: Positive for congestion and sore throat.   Respiratory: Positive for cough.    Past Medical History  Diagnosis Date  . Hypertension   . Personal history of goiter   . S/P thyroidectomy   . S/P radioactive iodine thyroid ablation   . Iatrogenic thyroiditis   . Non-ischemic cardiomyopathy     EF 28%- reassessment of LV function 2011 with LVEf 45-50%  . History of cardiac cath 05/2007    normal-with patent coronaries  . PAD (peripheral artery disease)     lower extremities with ABIs of 0.5 bilaterally  . History of colonoscopy     History   Social History  . Marital Status: Divorced    Spouse Name: N/A    Number of Children: 1  . Years of Education: N/A   Occupational History  . Retired     Education officer, museum   Social History Main Topics  . Smoking status: Current Everyday Smoker  . Smokeless tobacco: Not on file  . Alcohol Use: No  . Drug Use: No  . Sexually Active: Not on file   Other Topics Concern  . 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 Total abdominal hysterectomy   . Thyroidectomy   . Tonsillectomy     Family History  Problem Relation Age of Onset  . Diabetes type II Mother   . Hypertension Mother   . Diabetes Mother   . Other Father     deceased from accident age 23    Allergies  Allergen Reactions  . Codeine     Current  Outpatient Prescriptions on File Prior to Visit  Medication Sig Dispense Refill  . amLODipine (NORVASC) 10 MG tablet Take 1 tablet (10 mg total) by mouth daily.  90 tablet  1  . aspirin 81 MG tablet Take 81 mg by mouth daily.        Marland Kitchen atorvastatin (LIPITOR) 40 MG tablet Take 1 tablet (40 mg total) by mouth daily.  90 tablet  1  . carvedilol (COREG) 25 MG tablet Take 1 tablet (25 mg total) by mouth 2 (two) times daily with a meal.  180 tablet  1  . glucose blood (ONE TOUCH TEST STRIPS) test strip Use as instructed to check blood sugar once a day       . levothyroxine (SYNTHROID, LEVOTHROID) 200 MCG tablet Take 1 tablet (200 mcg total) by mouth daily.  30 tablet  3  . losartan-hydrochlorothiazide (HYZAAR) 100-25 MG per tablet Take 1 tablet by mouth daily.  90 tablet  1  . metFORMIN (GLUCOPHAGE) 500 MG tablet Take 1 tablet (500 mg total) by mouth 2 (two) times daily with a meal.  180 tablet  1    There were no vitals taken for this visit.  Objective:   Physical Exam  Constitutional: She appears well-developed and well-nourished.  HENT:  Right Ear: External ear normal.  Left Ear: External ear normal.  Mouth/Throat: No oropharyngeal exudate.       Mild oropharyngeal erythema  Cardiovascular: Normal rate, regular rhythm and normal heart sounds.   Pulmonary/Chest: Effort normal.       Coarse breath sounds bilaterally  Skin: Skin is warm and dry.  Psychiatric: She has a normal mood and affect. Her behavior is normal.          Assessment & Plan:

## 2011-04-27 ENCOUNTER — Telehealth: Payer: Self-pay | Admitting: Internal Medicine

## 2011-04-27 NOTE — Telephone Encounter (Signed)
Refill cough syrup to [facility]. She is 90% better,but still has a little cough. Thanks.

## 2011-04-28 NOTE — Telephone Encounter (Signed)
Ok to refill cough syrup x 1

## 2011-05-01 MED ORDER — HYDROCODONE-HOMATROPINE 5-1.5 MG/5ML PO SYRP: 5.0000 mL | ORAL_SOLUTION | Freq: Three times a day (TID) | ORAL | Status: AC | PRN

## 2011-05-01 NOTE — Telephone Encounter (Signed)
rx called in, pt aware 

## 2012-01-17 ENCOUNTER — Ambulatory Visit (INDEPENDENT_AMBULATORY_CARE_PROVIDER_SITE_OTHER): Payer: Medicare Other | Admitting: Internal Medicine

## 2012-01-17 ENCOUNTER — Ambulatory Visit: Payer: Medicare Other | Admitting: Internal Medicine

## 2012-01-17 VITALS — BP 164/86 | HR 100 | Temp 98.8°F | Resp 24 | Ht 69.5 in | Wt 226.0 lb

## 2012-01-17 DIAGNOSIS — I10 Essential (primary) hypertension: Secondary | ICD-10-CM

## 2012-01-17 DIAGNOSIS — J40 Bronchitis, not specified as acute or chronic: Secondary | ICD-10-CM

## 2012-01-17 DIAGNOSIS — E119 Type 2 diabetes mellitus without complications: Secondary | ICD-10-CM

## 2012-01-17 MED ORDER — LEVOTHYROXINE SODIUM 200 MCG PO TABS: 200.0000 ug | ORAL_TABLET | Freq: Every day | ORAL | Status: DC

## 2012-01-17 MED ORDER — HYDROCODONE-HOMATROPINE 5-1.5 MG/5ML PO SYRP: 5.0000 mL | ORAL_SOLUTION | Freq: Two times a day (BID) | ORAL | Status: DC | PRN

## 2012-01-17 MED ORDER — MOMETASONE FURO-FORMOTEROL FUM 200-5 MCG/ACT IN AERO: 2.0000 | INHALATION_SPRAY | Freq: Two times a day (BID) | RESPIRATORY_TRACT | Status: DC

## 2012-01-17 MED ORDER — LOSARTAN POTASSIUM-HCTZ 100-25 MG PO TABS: 1.0000 | ORAL_TABLET | Freq: Every day | ORAL | Status: DC

## 2012-01-17 MED ORDER — METFORMIN HCL 500 MG PO TABS: 500.0000 mg | ORAL_TABLET | Freq: Two times a day (BID) | ORAL | Status: DC

## 2012-01-17 MED ORDER — LEVOFLOXACIN 500 MG PO TABS: 500.0000 mg | ORAL_TABLET | Freq: Every day | ORAL | Status: DC

## 2012-01-17 MED ORDER — ATORVASTATIN CALCIUM 40 MG PO TABS: 40.0000 mg | ORAL_TABLET | Freq: Every day | ORAL | Status: DC

## 2012-01-17 MED ORDER — AMLODIPINE BESYLATE 10 MG PO TABS: 10.0000 mg | ORAL_TABLET | Freq: Every day | ORAL | Status: DC

## 2012-01-17 MED ORDER — CARVEDILOL 25 MG PO TABS: 25.0000 mg | ORAL_TABLET | Freq: Two times a day (BID) | ORAL | Status: DC

## 2012-01-17 NOTE — Progress Notes (Signed)
Subjective:    Patient ID: Paula Stein, female    DOB: 07-Aug-1945, 66 y.o.   MRN: [id number]  HPI  66 year old African American female with history of type 2 diabetes, cardiomyopathy and hypertension complains of cough for the last 1 week. She reports symptoms initially started with sneezing and congestion but then progressed to cough that is productive of yellowish sputum. She has mild shortness of breath and dyspnea with exertion. She denies fever or chills.   She has decreased her tobacco use but has not been able to quit smoking completely.   Review of Systems See HPI  Past Medical History  Diagnosis Date  . Hypertension   . Personal history of goiter   . S/P thyroidectomy   . S/P radioactive iodine thyroid ablation   . Iatrogenic thyroiditis   . Non-ischemic cardiomyopathy     EF 28%- reassessment of LV function 2011 with LVEf 45-50%  . History of cardiac cath 05/2007    normal-with patent coronaries  . PAD (peripheral artery disease)     lower extremities with ABIs of 0.5 bilaterally  . History of colonoscopy     History   Social History  . Marital Status: Divorced    Spouse Name: N/A    Number of Children: 1  . Years of Education: N/A   Occupational History  . Retired     Education officer, museum   Social History Main Topics  . Smoking status: Current Every Day Smoker  . Smokeless tobacco: Not on file  . Alcohol Use: No  . Drug Use: No  . Sexually Active: Not on file   Other Topics Concern  . 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 Total abdominal hysterectomy   . Thyroidectomy   . Tonsillectomy     Family History  Problem Relation Age of Onset  . Diabetes type II Mother   . Hypertension Mother   . Diabetes Mother   . Other Father     deceased from accident age 17     Allergies  Allergen Reactions  . Codeine     Current Outpatient Prescriptions on File Prior to Visit  Medication Sig Dispense Refill  . aspirin 81 MG tablet Take 81 mg by mouth daily.        Marland Kitchen glucose blood (ONE TOUCH TEST STRIPS) test strip Use as instructed to check blood sugar once a day       . DISCONTD: amLODipine (NORVASC) 10 MG tablet Take 1 tablet (10 mg total) by mouth daily.  90 tablet  1  . DISCONTD: atorvastatin (LIPITOR) 40 MG tablet Take 1 tablet (40 mg total) by mouth daily.  90 tablet  1  . DISCONTD: carvedilol (COREG) 25 MG tablet Take 1 tablet (25 mg total) by mouth 2 (two) times daily with a meal.  180 tablet  1  . DISCONTD: levothyroxine (SYNTHROID, LEVOTHROID) 200 MCG tablet Take 1 tablet (200 mcg total) by mouth daily.  30 tablet  3  . DISCONTD: losartan-hydrochlorothiazide (HYZAAR) 100-25 MG per tablet Take 1 tablet by mouth daily.  90 tablet  1  . DISCONTD: metFORMIN (GLUCOPHAGE) 500 MG tablet Take 1 tablet (500 mg total) by mouth 2 (two) times daily with a meal.  180 tablet  1  . Mometasone Furo-Formoterol  Fum (DULERA) 200-5 MCG/ACT AERO Inhale 2 puffs into the lungs 2 (two) times daily.  8.8 g  0    BP 164/86  Pulse 100  Temp 98.8 F (37.1 C)  Resp 24  Ht 5' 9.5" (1.765 m)  Wt 226 lb (102.513 kg)  BMI 32.90 kg/m2  SpO2 93%       Objective:   Physical Exam  Constitutional: She is oriented to person, place, and time. She appears well-developed and well-nourished.  HENT:  Head: Normocephalic and atraumatic.  Right Ear: External ear normal.  Left Ear: External ear normal.  Mouth/Throat: No oropharyngeal exudate.       Oropharyngeal erythema  Eyes: EOM are normal. Pupils are equal, round, and reactive to light.  Neck: Neck supple.       No neck tenderness  Cardiovascular: Normal rate, regular rhythm and normal heart sounds.   Pulmonary/Chest: Effort normal. She has wheezes.       Mild coarse breath sounds bilaterally  Musculoskeletal: She  exhibits no edema.  Lymphadenopathy:    She has no cervical adenopathy.  Neurological: She is alert and oriented to person, place, and time.  Skin: Skin is warm and dry.  Psychiatric: She has a normal mood and affect. Her behavior is normal.          Assessment & Plan:

## 2012-01-17 NOTE — Assessment & Plan Note (Signed)
66 year old [place] American female with bronchitis. Treat with Levaquin 500 mg once daily for 10 days. Use Hycodan for cough as needed. Also use Dulera -2 puffs twice daily. (Sample provided) Patient treated with albuterol nebulizer during office visit.

## 2012-01-17 NOTE — Patient Instructions (Addendum)
Please complete the following lab tests before your next follow up appointment: BMET - 401.9 A1c - 790.29 FLP, LFTs - 272.4 TSH - 244.9

## 2012-01-25 ENCOUNTER — Other Ambulatory Visit: Payer: Medicare Other

## 2012-01-30 ENCOUNTER — Other Ambulatory Visit: Payer: Self-pay | Admitting: Internal Medicine

## 2012-01-30 DIAGNOSIS — Z1231 Encounter for screening mammogram for malignant neoplasm of breast: Secondary | ICD-10-CM

## 2012-01-31 ENCOUNTER — Ambulatory Visit: Payer: Medicare Other | Admitting: Internal Medicine

## 2012-02-01 ENCOUNTER — Other Ambulatory Visit (INDEPENDENT_AMBULATORY_CARE_PROVIDER_SITE_OTHER): Payer: Medicare Other

## 2012-02-01 DIAGNOSIS — E039 Hypothyroidism, unspecified: Secondary | ICD-10-CM

## 2012-02-01 DIAGNOSIS — E785 Hyperlipidemia, unspecified: Secondary | ICD-10-CM

## 2012-02-01 DIAGNOSIS — R7309 Other abnormal glucose: Secondary | ICD-10-CM

## 2012-02-01 DIAGNOSIS — I10 Essential (primary) hypertension: Secondary | ICD-10-CM

## 2012-02-01 LAB — HEPATIC FUNCTION PANEL
ALT: 12 U/L (ref 0–35)
Bilirubin, Direct: 0.1 mg/dL (ref 0.0–0.3)
Total Bilirubin: 0.8 mg/dL (ref 0.3–1.2)

## 2012-02-01 LAB — BASIC METABOLIC PANEL
BUN: 20 mg/dL (ref 6–23)
Calcium: 9.3 mg/dL (ref 8.4–10.5)
Chloride: 102 mEq/L (ref 96–112)
Creatinine, Ser: 1.2 mg/dL (ref 0.4–1.2)

## 2012-02-01 LAB — HEMOGLOBIN A1C: Hgb A1c MFr Bld: 7 % — ABNORMAL HIGH (ref 4.6–6.5)

## 2012-02-01 LAB — LIPID PANEL
Cholesterol: 182 mg/dL (ref 0–200)
HDL: 59.3 mg/dL (ref >39.00)

## 2012-02-01 LAB — TSH: TSH: 4.32 u[IU]/mL (ref 0.35–5.50)

## 2012-02-08 ENCOUNTER — Ambulatory Visit (INDEPENDENT_AMBULATORY_CARE_PROVIDER_SITE_OTHER): Payer: Medicare Other | Admitting: Internal Medicine

## 2012-02-08 ENCOUNTER — Encounter: Payer: Self-pay | Admitting: Internal Medicine

## 2012-02-08 VITALS — BP 130/80 | HR 88 | Temp 98.6°F | Wt 223.0 lb

## 2012-02-08 DIAGNOSIS — E119 Type 2 diabetes mellitus without complications: Secondary | ICD-10-CM

## 2012-02-08 DIAGNOSIS — E039 Hypothyroidism, unspecified: Secondary | ICD-10-CM

## 2012-02-08 DIAGNOSIS — J40 Bronchitis, not specified as acute or chronic: Secondary | ICD-10-CM

## 2012-02-08 DIAGNOSIS — R0602 Shortness of breath: Secondary | ICD-10-CM

## 2012-02-08 MED ORDER — LEVOTHYROXINE SODIUM 112 MCG PO TABS: 224.0000 ug | ORAL_TABLET | Freq: Every day | ORAL | Status: DC

## 2012-02-08 MED ORDER — METFORMIN HCL 500 MG PO TABS: 500.0000 mg | ORAL_TABLET | Freq: Three times a day (TID) | ORAL | Status: DC

## 2012-02-08 NOTE — Patient Instructions (Addendum)
Please complete the following lab tests before your next follow up appointment: BMET, A1c, Microalbumin / Cr ratio - 250.02 TSH - 244.9

## 2012-02-08 NOTE — Assessment & Plan Note (Signed)
Increase Levothroid dose to 224 mcg daily. Goal TSH between 1 and 2. Lab Results  Component Value Date   TSH 4.32 02/01/2012

## 2012-02-08 NOTE — Assessment & Plan Note (Signed)
Resolved with Levaquin. Patient encouraged to stay off cigarettes.

## 2012-02-08 NOTE — Progress Notes (Signed)
Subjective:    Patient ID: Paula Stein, female    DOB: Jan 17, 1946, 66 y.o.   MRN: [id number]  HPI  66 year old African American female for follow up regarding bronchitis and type 2 diabetes. Patient reports cough and wheezing significantly improved since finishing Levaquin and using her inhalers.  She has also been able to quit smoking.  Diabetes mellitus type 2-she has noticed increase in appetite since quitting smoking. Her A1c is fairly stable.    Review of Systems Negative for chest pain or shortness of breath  Past Medical History  Diagnosis Date  . Hypertension   . Personal history of goiter   . S/P thyroidectomy   . S/P radioactive iodine thyroid ablation   . Iatrogenic thyroiditis   . Non-ischemic cardiomyopathy     EF 28%- reassessment of LV function 2011 with LVEf 45-50%  . History of cardiac cath 05/2007    normal-with patent coronaries  . PAD (peripheral artery disease)     lower extremities with ABIs of 0.5 bilaterally  . History of colonoscopy     History   Social History  . Marital Status: Divorced    Spouse Name: N/A    Number of Children: 1  . Years of Education: N/A   Occupational History  . Retired     Education officer, museum   Social History Main Topics  . Smoking status: Current Every Day Smoker  . Smokeless tobacco: Not on file  . Alcohol Use: No  . Drug Use: No  . Sexually Active: Not on file   Other Topics Concern  . 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 Total abdominal hysterectomy   . Thyroidectomy   . Tonsillectomy     Family History  Problem Relation Age of Onset  . Diabetes type II Mother   . Hypertension Mother   . Diabetes Mother   . Other Father     deceased from accident age 79    Allergies  Allergen Reactions  . Codeine     Current  Outpatient Prescriptions on File Prior to Visit  Medication Sig Dispense Refill  . amLODipine (NORVASC) 10 MG tablet Take 1 tablet (10 mg total) by mouth daily.  90 tablet  1  . aspirin 81 MG tablet Take 81 mg by mouth daily.        Marland Kitchen atorvastatin (LIPITOR) 40 MG tablet Take 1 tablet (40 mg total) by mouth daily.  90 tablet  1  . carvedilol (COREG) 25 MG tablet Take 1 tablet (25 mg total) by mouth 2 (two) times daily with a meal.  180 tablet  1  . glucose blood (ONE TOUCH TEST STRIPS) test strip Use as instructed to check blood sugar once a day       . losartan-hydrochlorothiazide (HYZAAR) 100-25 MG per tablet Take 1 tablet by mouth daily.  90 tablet  1  . [DISCONTINUED] levothyroxine (SYNTHROID, LEVOTHROID) 200 MCG tablet Take 1 tablet (200 mcg total) by mouth daily.  30 tablet  6  . [DISCONTINUED] metFORMIN (GLUCOPHAGE) 500 MG tablet Take 1 tablet (500 mg total) by mouth 2 (two) times daily with a meal.  180 tablet  1    BP 130/80  Pulse 88  Temp 98.6 F (37 C) (Oral)  Wt 223 lb (101.152  kg)       Objective:   Physical Exam  Constitutional: She is oriented to person, place, and time. She appears well-developed and well-nourished.  Cardiovascular: Normal rate, regular rhythm and normal heart sounds.   Pulmonary/Chest: Effort normal and breath sounds normal. She has no wheezes.  Neurological: She is alert and oriented to person, place, and time. No cranial nerve deficit.  Skin: Skin is warm and dry.          Assessment & Plan:

## 2012-02-08 NOTE — Assessment & Plan Note (Signed)
A1c is stable but patient having difficulty controlling her appetite. Increase metformin to 500 mg to 3 times a day.

## 2012-03-04 ENCOUNTER — Ambulatory Visit
Admission: RE | Admit: 2012-03-04 | Discharge: 2012-03-04 | Disposition: A | Discharge status: Home / Self Care | Payer: Medicare Other | Source: Ambulatory Visit | Attending: Internal Medicine | Admitting: Internal Medicine

## 2012-03-04 DIAGNOSIS — Z1231 Encounter for screening mammogram for malignant neoplasm of breast: Secondary | ICD-10-CM

## 2012-04-09 ENCOUNTER — Ambulatory Visit: Payer: Medicare Other | Admitting: Internal Medicine

## 2012-06-25 ENCOUNTER — Emergency Department (HOSPITAL_COMMUNITY)
Admission: EM | Admit: 2012-06-25 | Discharge: 2012-06-25 | Disposition: A | Discharge status: Home / Self Care | Payer: Medicare PPO | Attending: Emergency Medicine | Admitting: Emergency Medicine

## 2012-06-25 ENCOUNTER — Emergency Department (HOSPITAL_COMMUNITY): Payer: Medicare PPO

## 2012-06-25 DIAGNOSIS — Z87891 Personal history of nicotine dependence: Secondary | ICD-10-CM | POA: Insufficient documentation

## 2012-06-25 DIAGNOSIS — Z8639 Personal history of other endocrine, nutritional and metabolic disease: Secondary | ICD-10-CM | POA: Insufficient documentation

## 2012-06-25 DIAGNOSIS — Z8719 Personal history of other diseases of the digestive system: Secondary | ICD-10-CM | POA: Insufficient documentation

## 2012-06-25 DIAGNOSIS — R0989 Other specified symptoms and signs involving the circulatory and respiratory systems: Secondary | ICD-10-CM | POA: Insufficient documentation

## 2012-06-25 DIAGNOSIS — Z9889 Other specified postprocedural states: Secondary | ICD-10-CM | POA: Insufficient documentation

## 2012-06-25 DIAGNOSIS — R0602 Shortness of breath: Secondary | ICD-10-CM | POA: Insufficient documentation

## 2012-06-25 DIAGNOSIS — E119 Type 2 diabetes mellitus without complications: Secondary | ICD-10-CM | POA: Insufficient documentation

## 2012-06-25 DIAGNOSIS — R0609 Other forms of dyspnea: Secondary | ICD-10-CM | POA: Insufficient documentation

## 2012-06-25 DIAGNOSIS — R05 Cough: Secondary | ICD-10-CM | POA: Insufficient documentation

## 2012-06-25 DIAGNOSIS — I10 Essential (primary) hypertension: Secondary | ICD-10-CM | POA: Insufficient documentation

## 2012-06-25 DIAGNOSIS — Z7982 Long term (current) use of aspirin: Secondary | ICD-10-CM | POA: Insufficient documentation

## 2012-06-25 DIAGNOSIS — Z862 Personal history of diseases of the blood and blood-forming organs and certain disorders involving the immune mechanism: Secondary | ICD-10-CM | POA: Insufficient documentation

## 2012-06-25 DIAGNOSIS — Z923 Personal history of irradiation: Secondary | ICD-10-CM | POA: Insufficient documentation

## 2012-06-25 DIAGNOSIS — Z79899 Other long term (current) drug therapy: Secondary | ICD-10-CM | POA: Insufficient documentation

## 2012-06-25 DIAGNOSIS — R059 Cough, unspecified: Secondary | ICD-10-CM | POA: Insufficient documentation

## 2012-06-25 DIAGNOSIS — Z9861 Coronary angioplasty status: Secondary | ICD-10-CM | POA: Insufficient documentation

## 2012-06-25 DIAGNOSIS — Z8679 Personal history of other diseases of the circulatory system: Secondary | ICD-10-CM | POA: Insufficient documentation

## 2012-06-25 DIAGNOSIS — I509 Heart failure, unspecified: Secondary | ICD-10-CM | POA: Insufficient documentation

## 2012-06-25 LAB — POCT I-STAT, CHEM 8
BUN: 19 mg/dL (ref 6–23)
Calcium, Ion: 1.15 mmol/L (ref 1.13–1.30)
Chloride: 111 mEq/L (ref 96–112)
HCT: 43 % (ref 36.0–46.0)
Potassium: 4.3 mEq/L (ref 3.5–5.1)
Sodium: 143 mEq/L (ref 135–145)

## 2012-06-25 LAB — POCT I-STAT TROPONIN I: Troponin i, poc: 0.02 ng/mL (ref 0.00–0.08)

## 2012-06-25 LAB — CBC
HCT: 41.5 % (ref 36.0–46.0)
MCH: 30.9 pg (ref 26.0–34.0)
MCV: 94.3 fL (ref 78.0–100.0)
RBC: 4.4 MIL/uL (ref 3.87–5.11)
WBC: 7.6 10*3/uL (ref 4.0–10.5)

## 2012-06-25 LAB — PRO B NATRIURETIC PEPTIDE: Pro B Natriuretic peptide (BNP): 922.5 pg/mL — ABNORMAL HIGH (ref 0–125)

## 2012-06-25 MED ORDER — FUROSEMIDE 20 MG PO TABS: 20.0000 mg | ORAL_TABLET | Freq: Two times a day (BID) | ORAL | Status: DC

## 2012-06-25 MED ORDER — FUROSEMIDE 10 MG/ML IJ SOLN
40.0000 mg | INTRAMUSCULAR | Status: AC
  Administered 2012-06-25: 40 mg via INTRAVENOUS
  Filled 2012-06-25: qty 4

## 2012-06-25 NOTE — ED Provider Notes (Signed)
Medical screening examination/treatment/procedure(s) were conducted as a shared visit with non-physician practitioner(s) and myself.  I personally evaluated the patient during the encounter  Patient presents with what appears to be a mild CHF exacerbation.  The patient feels much better after IV Lasix.  She has urinated twice.  Cannulated without any difficulty.  The patient has a scheduled appointment that we set up for the patient on Friday at 1:30 PM.  She understands the importance of close followup with her cardiologist.  Her last EF was 45-50% although she does have a history of nonischemic cardiomyopathy before in the past.  EKG is without significant changes.  Troponin is negative.  BNP is only mildly elevated.  Hoy Morn, MD 06/25/12 1045

## 2012-06-25 NOTE — ED Notes (Signed)
Pt ambulated to BR. Pt ambulated well, steady gait. O2 sats began to decrease as pt was returning to bed (87%), and had some minor difficulty breathing.  Reapplied O2 at 2L/min and had pt take several deep breaths and O2 sat returned to 99%.

## 2012-06-25 NOTE — ED Provider Notes (Signed)
History     CSN: [id number]  Arrival date & time 06/25/12  0630   First MD Initiated Contact with Patient 06/25/12 [phone]      Chief Complaint  Patient presents with  . Chest Pain    (Consider location/radiation/quality/duration/timing/severity/associated sxs/prior treatment) HPI Comments: Patient is a 67 year old female with a past medical history including diabetes, hypertension, PAD, and non-ischemic cardiomyopathy who presents with chest pain that started gradually 3 days ago. Patient reports noticing a "prickling" chest pain located in the central chest without radiation. Patient reports associated dyspnea with exertion that is progressively worsening since the onset of chest pain. Patient also reports a 3 day history of productive cough. The patient denies associated nausea, dizziness, and diaphoresis. The pain is intermittent and starts without known trigger. No aggravating/alleviating factors. Minimal exertion makes the SOB worse. Patient denies fever, headache, visual changes, vomiting, diarrhea, abdominal pain, numbness/tingling.    Patient is a 67 y.o. female presenting with chest pain.  Chest Pain Associated symptoms: shortness of breath     Past Medical History  Diagnosis Date  . Hypertension   . Personal history of goiter   . S/P thyroidectomy   . S/P radioactive iodine thyroid ablation   . Iatrogenic thyroiditis   . Non-ischemic cardiomyopathy     EF 28%- reassessment of LV function 2011 with LVEf 45-50%  . History of cardiac cath 05/2007    normal-with patent coronaries  . PAD (peripheral artery disease)     lower extremities with ABIs of 0.5 bilaterally  . History of colonoscopy     Past Surgical History  Procedure Laterality Date  . Total abdominal hysterectomy    . Thyroidectomy    . Tonsillectomy      Family History  Problem Relation Age of Onset  . Diabetes type II Mother   . Hypertension Mother   . Diabetes Mother   . Other Father     deceased  from accident age 56    History  Substance Use Topics  . Smoking status: Former Smoker    Quit date: 01/24/2012  . Smokeless tobacco: Not on file  . Alcohol Use: No    OB History   Grav Para Term Preterm Abortions TAB SAB Ect Mult Living                  Review of Systems  Respiratory: Positive for shortness of breath.   Cardiovascular: Positive for chest pain.  All other systems reviewed and are negative.    Allergies  Codeine  Home Medications   Current Outpatient Rx  Name  Route  Sig  Dispense  Refill  . amLODipine (NORVASC) 10 MG tablet   Oral   Take 1 tablet (10 mg total) by mouth daily.   90 tablet   1   . aspirin 81 MG tablet   Oral   Take 81 mg by mouth daily.           Marland Kitchen atorvastatin (LIPITOR) 40 MG tablet   Oral   Take 1 tablet (40 mg total) by mouth daily.   90 tablet   1   . carvedilol (COREG) 25 MG tablet   Oral   Take 1 tablet (25 mg total) by mouth 2 (two) times daily with a meal.   180 tablet   1   . levothyroxine (SYNTHROID, LEVOTHROID) 200 MCG tablet   Oral   Take 200 mcg by mouth daily.         Marland Kitchen  losartan-hydrochlorothiazide (HYZAAR) 100-25 MG per tablet   Oral   Take 1 tablet by mouth daily.   90 tablet   1   . metFORMIN (GLUCOPHAGE) 500 MG tablet   Oral   Take 500 mg by mouth 2 (two) times daily with a meal.         . glucose blood (ONE TOUCH TEST STRIPS) test strip      Use as instructed to check blood sugar once a day            BP 159/87  Pulse 78  Temp(Src) 97.8 F (36.6 C) (Oral)  Resp 22  SpO2 100%  Physical Exam  Nursing note and vitals reviewed. Constitutional: She is oriented to person, place, and time. She appears well-developed and well-nourished. No distress.  HENT:  Head: Normocephalic and atraumatic.  Eyes: Conjunctivae are normal.  Neck: Normal range of motion. Neck supple.  Cardiovascular: Normal rate and regular rhythm.  Exam reveals no gallop and no friction rub.   No murmur  heard. Pulmonary/Chest: Effort normal. She has no wheezes. She has no rales. She exhibits no tenderness.  Mild bibasilar crackles noted.   Abdominal: Soft. She exhibits no distension. There is no tenderness. There is no rebound and no guarding.  Musculoskeletal: Normal range of motion.  No lower leg edema noted.   Neurological: She is alert and oriented to person, place, and time. Coordination normal.  Speech is goal-oriented. Moves limbs without ataxia.   Skin: Skin is warm and dry.  Psychiatric: She has a normal mood and affect. Her behavior is normal.    ED Course  Procedures (including critical care time)   Date: 06/25/2012  Rate: 87  Rhythm: normal sinus rhythm  QRS Axis: normal  Intervals: QT prolonged  ST/T Wave abnormalities: normal  Conduction Disutrbances:first-degree A-V block   Narrative Interpretation: NSR with 1st degree AV block   Old EKG Reviewed: none available    Labs Reviewed  CBC - Abnormal; Notable for the following:    RDW 16.0 (*)    All other components within normal limits  PRO B NATRIURETIC PEPTIDE - Abnormal; Notable for the following:    Pro B Natriuretic peptide (BNP) 922.5 (*)    All other components within normal limits  POCT I-STAT, CHEM 8 - Abnormal; Notable for the following:    Creatinine, Ser 1.30 (*)    Glucose, Bld 147 (*)    All other components within normal limits  POCT I-STAT TROPONIN I   Dg Chest Port 1 View  06/25/2012  *RADIOLOGY REPORT*  Clinical Data: Chest pain.  Short of breath.  PORTABLE CHEST - 1 VIEW  Comparison: 01/03/2010.  Findings: Cardiomegaly is present.  Interstitial and mild alveolar pulmonary edema is present. Monitoring leads are projected over the chest.  Lung volumes are lower than on prior.  IMPRESSION: Mild to moderate CHF.   Original Report Authenticated By: Dereck Ligas, M.D.      1. CHF exacerbation       MDM  8:06 AM Patient's BNP elevated at 922. Labs otherwise unremarkable. Patient feeling  better on 2L O2 nasal cannula. Vitals stable. Chest xray shows mild to moderate CHF.   10:23 AM Troponin negative. Patient given 40mg  IV Lasix. She was ambulated and is able to walk without difficulty. Patient appears to be having a mild CHF exacerbation that can be managed as an outpatient with close follow up. Patient has a follow up appointment with Dr. Burt Knack this Friday March 28 at  1:30pm. Patient will go home with 20mg  Lasix PO BID for 5 days. I will send a note to Dr. Burt Knack regarding the patient's visit here. Vitals stable and patient appropriate for discharge. Patient instructed to return with worsening or concerning symptoms. Patient and family agreeable to plan.      Alvina Chou, PA-C 06/25/12 1035

## 2012-06-25 NOTE — ED Notes (Signed)
PT. REPORTS MID CHEST PAIN WITH SOB AND OCCASIONAL PRODUCTIVE COUGH ONSET YESTERDAY , DENIES NAUSEA , SLIGHT DIAPHORESIS .

## 2012-06-26 ENCOUNTER — Telehealth: Payer: Self-pay | Admitting: *Deleted

## 2012-06-26 NOTE — Telephone Encounter (Signed)
done

## 2012-06-28 ENCOUNTER — Encounter: Payer: Self-pay | Admitting: Nurse Practitioner

## 2012-06-28 ENCOUNTER — Ambulatory Visit (INDEPENDENT_AMBULATORY_CARE_PROVIDER_SITE_OTHER): Payer: Medicare PPO | Admitting: Nurse Practitioner

## 2012-06-28 VITALS — BP 148/88 | HR 80 | Ht 70.5 in | Wt 232.4 lb

## 2012-06-28 DIAGNOSIS — R0602 Shortness of breath: Secondary | ICD-10-CM

## 2012-06-28 DIAGNOSIS — I428 Other cardiomyopathies: Secondary | ICD-10-CM

## 2012-06-28 LAB — BASIC METABOLIC PANEL
BUN: 24 mg/dL — ABNORMAL HIGH (ref 6–23)
CO2: 25 mEq/L (ref 19–32)
Calcium: 9.4 mg/dL (ref 8.4–10.5)
Chloride: 104 mEq/L (ref 96–112)
Creatinine, Ser: 1.5 mg/dL — ABNORMAL HIGH (ref 0.4–1.2)
GFR: 44.64 mL/min — ABNORMAL LOW (ref >60.00)
Glucose, Bld: 122 mg/dL — ABNORMAL HIGH (ref 70–99)
Potassium: 3.7 mEq/L (ref 3.5–5.1)
Sodium: 139 mEq/L (ref 135–145)

## 2012-06-28 LAB — BRAIN NATRIURETIC PEPTIDE: Pro B Natriuretic peptide (BNP): 46 pg/mL (ref 0.0–100.0)

## 2012-06-28 MED ORDER — FUROSEMIDE 20 MG PO TABS: 20.0000 mg | ORAL_TABLET | Freq: Every day | ORAL | Status: DC

## 2012-06-28 NOTE — Patient Instructions (Addendum)
Continue with your current medicines - I am leaving you on the Lasix 20 mg just once a day  Monitor your blood pressure at home and keep a diary - bring this to your visit with Dr. Burt Knack  We will need to update your ultrasound of your heart  Try to stay off the cigarettes  Ok to get back to walking - start slow and work your way up to 45 minutes a day  Restrict your salt intake  Keep you visit with Dr. Burt Knack for next month  Call the [facility] office at [phone] if you have any questions, problems or concerns.

## 2012-06-28 NOTE — Progress Notes (Signed)
Paula Stein Date of Birth: Aug 27, 1945 Medical Record [id number]  History of Present Illness: [name] is seen back today for a post ER visit. She is seen for Dr. Burt Knack. She has DM, obesity, HTN, PAD, and a nonischemic CM. EF was 28% but up to 45 to 50% per echo in 2011. She has had remote cath in 2009 that showed her coronaries to be normal.   She was most recently in the ER with atypical chest pain, dyspnea and productive cough. Felt to have some volume overload. Treated with a dose of IV lasix.   She comes in today. She is here with her son, Will. She is doing much better. Was apparently having sleep issues but this was because every time she laid down, she was short of breath. Was using lots of salt. Eating lots of fast food and fried foods. Now trying to do better. Not checking her BP regularly at home. Was also back smoking but has stopped again. No more chest pain.   Current Outpatient Prescriptions on File Prior to Visit  Medication Sig Dispense Refill  . amLODipine (NORVASC) 10 MG tablet Take 1 tablet (10 mg total) by mouth daily.  90 tablet  1  . aspirin 81 MG tablet Take 81 mg by mouth daily.        Marland Kitchen atorvastatin (LIPITOR) 40 MG tablet Take 1 tablet (40 mg total) by mouth daily.  90 tablet  1  . carvedilol (COREG) 25 MG tablet Take 1 tablet (25 mg total) by mouth 2 (two) times daily with a meal.  180 tablet  1  . glucose blood (ONE TOUCH TEST STRIPS) test strip Use as instructed to check blood sugar once a day       . levothyroxine (SYNTHROID, LEVOTHROID) 200 MCG tablet Take 200 mcg by mouth daily.      Marland Kitchen losartan-hydrochlorothiazide (HYZAAR) 100-25 MG per tablet Take 1 tablet by mouth daily.  90 tablet  1  . metFORMIN (GLUCOPHAGE) 500 MG tablet Take 500 mg by mouth 3 (three) times daily with meals.        No current facility-administered medications on file prior to visit.    Allergies  Allergen Reactions  . Codeine     Past Medical History  Diagnosis Date  .  Hypertension   . Personal history of goiter   . S/P thyroidectomy   . S/P radioactive iodine thyroid ablation   . Iatrogenic thyroiditis   . Non-ischemic cardiomyopathy     EF 28%- reassessment of LV function 2011 with LVEf 45-50%  . History of cardiac cath 05/2007    normal-with patent coronaries  . PAD (peripheral artery disease)     lower extremities with ABIs of 0.5 bilaterally  . History of colonoscopy     Past Surgical History  Procedure Laterality Date  . Total abdominal hysterectomy    . Thyroidectomy    . Tonsillectomy      History  Smoking status  . Former Smoker  . Quit date: 01/24/2012  Smokeless tobacco  . Not on file    History  Alcohol Use No    Family History  Problem Relation Age of Onset  . Diabetes type II Mother   . Hypertension Mother   . Diabetes Mother   . Other Father     deceased from accident age 20    Review of Systems: The review of systems is per the HPI.  All other systems were reviewed and are negative.  Physical Exam: BP 148/88  Pulse 80  Ht 5' 10.5" (1.791 m)  Wt 232 lb 6.4 oz (105.416 kg)  BMI 32.86 kg/m2 Patient is very pleasant and in no acute distress. Skin is warm and dry. Color is normal.  HEENT is unremarkable. Normocephalic/atraumatic. PERRL. Sclera are nonicteric. Neck is supple. No masses. No JVD. Lungs are clear. Cardiac exam shows a regular rate and rhythm. Abdomen is soft. Extremities are without edema. Gait and ROM are intact. No gross neurologic deficits noted.  LABORATORY DATA: Repeat BMET is pending  Lab Results  Component Value Date   WBC 7.6 06/25/2012   HGB 14.6 06/25/2012   HCT 43.0 06/25/2012   PLT 245 06/25/2012   GLUCOSE 147* 06/25/2012   CHOL 182 02/01/2012   TRIG 105.0 02/01/2012   HDL 59.30 02/01/2012   LDLDIRECT 195.2 02/15/2011   LDLCALC 102* 02/01/2012   ALT 12 02/01/2012   AST 16 02/01/2012   NA 143 06/25/2012   K 4.3 06/25/2012   CL 111 06/25/2012   CREATININE 1.30* 06/25/2012   BUN 19  06/25/2012   CO2 25 02/01/2012   TSH 4.32 02/01/2012   INR 0.9 RATIO 05/09/2007   HGBA1C 7.0* 02/01/2012   MICROALBUR 9.8* 02/15/2011   Dg Chest Port 1 View  06/25/2012   IMPRESSION: Mild to moderate CHF.   Original Report Authenticated By: Dereck Ligas, M.D.     Echo from 2011 Study Conclusions  - Left ventricle: The cavity size was mildly dilated. Wall thickness was increased in a pattern of moderate LVH. Systolic function was mildly reduced. The estimated ejection fraction was in the range of 45% to 50%. - Mitral valve: Mild regurgitation. - Left atrium: The atrium was mildly dilated. - Atrial septum: No defect or patent foramen ovale was identified.    Assessment / Plan: 1. Recent ER visit for atypical chest pain/DOE/cough - felt to be volume overload - treated with Lasix. Do evidence of CAD per cath back in 2009. We will update her echo. I have left her on the Lasix 20 mg just once daily. Recheck her chemistries today. See Dr. Burt Knack next month as planned. She understands now the importance of sodium restriction.   2. Nonischemic CM - recheck her echo. She is on target dose of ARB and beta blocker.   3. DM - on Metformin  4. HTN - she will monitor at home. May need additional medicine. Hopefully as she does better with her lifestyle changes her blood pressure will improve.   Patient is agreeable to this plan and will call if any problems develop in the interim.   Burtis Junes, RN, ANP-C [facility] [address] [place], [place]  [address]

## 2012-07-05 ENCOUNTER — Ambulatory Visit (INDEPENDENT_AMBULATORY_CARE_PROVIDER_SITE_OTHER): Payer: Medicare PPO | Admitting: Internal Medicine

## 2012-07-05 VITALS — BP 124/84 | HR 88 | Temp 97.8°F | Wt 234.0 lb

## 2012-07-05 DIAGNOSIS — G4700 Insomnia, unspecified: Secondary | ICD-10-CM

## 2012-07-05 DIAGNOSIS — I509 Heart failure, unspecified: Secondary | ICD-10-CM | POA: Insufficient documentation

## 2012-07-05 DIAGNOSIS — I10 Essential (primary) hypertension: Secondary | ICD-10-CM

## 2012-07-05 DIAGNOSIS — F5104 Psychophysiologic insomnia: Secondary | ICD-10-CM | POA: Insufficient documentation

## 2012-07-05 MED ORDER — POTASSIUM CHLORIDE ER 10 MEQ PO TBCR: 10.0000 meq | EXTENDED_RELEASE_TABLET | Freq: Two times a day (BID) | ORAL | Status: DC

## 2012-07-05 MED ORDER — CARVEDILOL 25 MG PO TABS: 25.0000 mg | ORAL_TABLET | Freq: Two times a day (BID) | ORAL | Status: DC

## 2012-07-05 MED ORDER — METFORMIN HCL 500 MG PO TABS: 500.0000 mg | ORAL_TABLET | Freq: Two times a day (BID) | ORAL | Status: DC

## 2012-07-05 MED ORDER — ZOLPIDEM TARTRATE 5 MG PO TABS: 5.0000 mg | ORAL_TABLET | Freq: Every evening | ORAL | Status: DC | PRN

## 2012-07-05 NOTE — Assessment & Plan Note (Signed)
We discussed risk and benefits of using zolpidem. Trial of low-dose 5 mg at bedtime as needed.

## 2012-07-05 NOTE — Assessment & Plan Note (Signed)
Patient has history of nonischemic cardiomyopathy. Her last echocardiogram was 2011.    Left ventricle: The cavity size was mildly dilated. Wall thickness was increased in a pattern of moderate LVH. Systolic function was mildly reduced. The estimated ejection fraction was in the range of 45% to 50%.  Patient had recent exacerbation of congestive heart failure. She was seen in emergency room on 06/25/2012. She was diuresed with IV lasex. She has significantly altered her diet. She appears euvolemic on exam today. Continue same dose of Lasix. She is having intermittent muscle cramps. Start potassium supplementation. She has followup echocardiogram scheduled her cardiologist.

## 2012-07-05 NOTE — Progress Notes (Signed)
Subjective:    Patient ID: Paula Stein, female    DOB: 1945-07-11, 67 y.o.   MRN: [id number]  HPI  67 year old African American female with history of type 2 diabetes, hypertension and cardiomyopathy for emergency room followup. Patient was seen on 06/25/2012 secondary to shortness of breath. Patient found to be in congestive heart failure. Her BNP was elevated and chest x-ray showed mild to moderate congestive heart failure. She was treated with IV Lasix. She already had followup with her cardiologist. She is currently on Lasix 20 mg once daily. Prior to her CHF exacerbation, patient had liberal intake of salty foods.  She reports significant change to her diet and is weighing herself daily.  Patient also complains of chronic insomnia. This is been ongoing for several months. She has tried over-the-counter medications without any improvement.  Review of Systems Negative for chest pain or shortness of breath  Past Medical History  Diagnosis Date  . Hypertension   . Personal history of goiter   . S/P thyroidectomy   . S/P radioactive iodine thyroid ablation   . Iatrogenic thyroiditis   . Non-ischemic cardiomyopathy     EF 28%- reassessment of LV function 2011 with LVEf 45-50%  . History of cardiac cath 05/2007    normal-with patent coronaries  . PAD (peripheral artery disease)     lower extremities with ABIs of 0.5 bilaterally  . History of colonoscopy     History   Social History  . Marital Status: Divorced    Spouse Name: N/A    Number of Children: 1  . Years of Education: N/A   Occupational History  . Retired     Education officer, museum   Social History Main Topics  . Smoking status: Former Smoker    Quit date: 01/24/2012  . Smokeless tobacco: Not on file  . Alcohol Use: No  . Drug Use: No  . Sexually Active: Not Currently   Other Topics Concern  . Not on file   Social History Narrative   Retired Education officer, museum   Divorced - one grown son   current smoker    Alcohol  use-no      Drug use-no    Regular exercise-yes        son - [name]    Past Surgical History  Procedure Laterality Date  . Total abdominal hysterectomy    . Thyroidectomy    . Tonsillectomy      Family History  Problem Relation Age of Onset  . Diabetes type II Mother   . Hypertension Mother   . Diabetes Mother   . Other Father     deceased from accident age 54    Allergies  Allergen Reactions  . Codeine     Current Outpatient Prescriptions on File Prior to Visit  Medication Sig Dispense Refill  . amLODipine (NORVASC) 10 MG tablet Take 1 tablet (10 mg total) by mouth daily.  90 tablet  1  . aspirin 81 MG tablet Take 81 mg by mouth daily.        Marland Kitchen atorvastatin (LIPITOR) 40 MG tablet Take 1 tablet (40 mg total) by mouth daily.  90 tablet  1  . furosemide (LASIX) 20 MG tablet Take 1 tablet (20 mg total) by mouth daily.  30 tablet  11  . glucose blood (ONE TOUCH TEST STRIPS) test strip Use as instructed to check blood sugar once a day       . levothyroxine (SYNTHROID, LEVOTHROID) 200 MCG tablet Take  200 mcg by mouth daily.      Marland Kitchen losartan-hydrochlorothiazide (HYZAAR) 100-25 MG per tablet Take 1 tablet by mouth daily.  90 tablet  1   No current facility-administered medications on file prior to visit.    BP 124/84  Pulse 88  Temp(Src) 97.8 F (36.6 C) (Oral)  Wt 234 lb (106.142 kg)  BMI 33.09 kg/m2       Objective:   Physical Exam  Constitutional: She is oriented to person, place, and time. She appears well-developed and well-nourished.  HENT:  Head: Normocephalic and atraumatic.  Cardiovascular: Normal rate, regular rhythm and normal heart sounds.   No murmur heard. Pulmonary/Chest: Effort normal and breath sounds normal. She has no wheezes. She has no rales.  Musculoskeletal: She exhibits no edema.  Neurological: She is alert and oriented to person, place, and time. No cranial nerve deficit.  Skin: Skin is warm and dry.  Psychiatric: She has a normal  mood and affect. Her behavior is normal.          Assessment & Plan:

## 2012-07-05 NOTE — Patient Instructions (Signed)
Please complete the following lab tests before your next follow up appointment: BMET, A1c - 250.00 TSH - 244.9

## 2012-07-05 NOTE — Assessment & Plan Note (Signed)
Decrease metformin dose to 500 mg twice daily until repeat echocardiogram completed.

## 2012-07-11 ENCOUNTER — Ambulatory Visit (HOSPITAL_COMMUNITY): Payer: Medicare PPO | Attending: Internal Medicine

## 2012-07-11 DIAGNOSIS — E669 Obesity, unspecified: Secondary | ICD-10-CM | POA: Insufficient documentation

## 2012-07-11 DIAGNOSIS — I10 Essential (primary) hypertension: Secondary | ICD-10-CM | POA: Insufficient documentation

## 2012-07-11 DIAGNOSIS — E119 Type 2 diabetes mellitus without complications: Secondary | ICD-10-CM | POA: Insufficient documentation

## 2012-07-11 DIAGNOSIS — I428 Other cardiomyopathies: Secondary | ICD-10-CM | POA: Insufficient documentation

## 2012-07-11 DIAGNOSIS — I739 Peripheral vascular disease, unspecified: Secondary | ICD-10-CM | POA: Insufficient documentation

## 2012-07-11 NOTE — Progress Notes (Signed)
Echocardiogram performed.  

## 2012-07-16 ENCOUNTER — Encounter: Payer: Self-pay | Admitting: Cardiovascular Disease

## 2012-07-16 ENCOUNTER — Ambulatory Visit (INDEPENDENT_AMBULATORY_CARE_PROVIDER_SITE_OTHER): Payer: Medicare PPO | Admitting: Cardiovascular Disease

## 2012-07-16 VITALS — BP 159/90 | HR 91 | Ht 70.0 in | Wt 232.0 lb

## 2012-07-16 DIAGNOSIS — I509 Heart failure, unspecified: Secondary | ICD-10-CM

## 2012-07-16 DIAGNOSIS — I428 Other cardiomyopathies: Secondary | ICD-10-CM

## 2012-07-16 LAB — BASIC METABOLIC PANEL
CO2: 23 mEq/L (ref 19–32)
Calcium: 9.3 mg/dL (ref 8.4–10.5)
Chloride: 103 mEq/L (ref 96–112)
Glucose, Bld: 122 mg/dL — ABNORMAL HIGH (ref 70–99)
Potassium: 3.9 mEq/L (ref 3.5–5.1)
Sodium: 137 mEq/L (ref 135–145)

## 2012-07-16 MED ORDER — SPIRONOLACTONE 25 MG PO TABS: 12.5000 mg | ORAL_TABLET | Freq: Every day | ORAL | Status: DC

## 2012-07-16 MED ORDER — LOSARTAN POTASSIUM 100 MG PO TABS: 100.0000 mg | ORAL_TABLET | Freq: Every day | ORAL | Status: DC

## 2012-07-16 MED ORDER — FUROSEMIDE 40 MG PO TABS: 40.0000 mg | ORAL_TABLET | Freq: Two times a day (BID) | ORAL | Status: DC

## 2012-07-16 MED ORDER — HYDRALAZINE HCL 25 MG PO TABS: 25.0000 mg | ORAL_TABLET | Freq: Three times a day (TID) | ORAL | Status: DC

## 2012-07-16 NOTE — Patient Instructions (Addendum)
Your physician recommends that you schedule a follow-up appointment in:  2 weeks with [facility]  Your physician has recommended you make the following change in your medication:  Stop amlodipine.  Stop Losartan/HCTZ.  Start Losartan 100 mg by mouth daily. Increase furosemide to 40 mg by mouth twice daily.  Start hydralazine 25 mg by mouth three times daily.  Start aldactone 12. 5 mg by mouth daily

## 2012-07-16 NOTE — Progress Notes (Signed)
HPI:  67 year old woman presenting for followup evaluation. The patient has a history of nonischemic cardiomyopathy and systolic heart failure. I have not seen her since 2011. She has been noted to have lower extremity peripheral arterial disease and has been a long-term smoker. 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 She had mild coronary disease with global LV dysfunction.  She recently presented to the emergency department with symptoms of acute congestive heart failure. She was given IV diuretic and followed up closely in our office. She saw Truitt Merle who adjusted her medications. The patient has had an echocardiogram that demonstrated severe concentric LVH with severe LV systolic dysfunction. Her left ventricular ejection fraction is less than 30%.  She continues to complain of shortness of breath with low-level activity, New York Heart Association class III symptoms. She cannot walk a city block. She also has orthopnea and PND. She denies abdominal or leg swelling. She denies chest pain or pressure, palpitations, lightheadedness, or syncope. She continues to smoke cigarettes but is down to just a few puffs a day because of her shortness of breath.  Outpatient Encounter Prescriptions as of 07/16/2012  Medication Sig Dispense Refill  . aspirin 81 MG tablet Take 81 mg by mouth daily.        Marland Kitchen atorvastatin (LIPITOR) 40 MG tablet Take 1 tablet (40 mg total) by mouth daily.  90 tablet  1  . carvedilol (COREG) 25 MG tablet Take 1 tablet (25 mg total) by mouth 2 (two) times daily with a meal.  180 tablet  1  . glucose blood (ONE TOUCH TEST STRIPS) test strip Use as instructed to check blood sugar once a day       . levothyroxine (SYNTHROID, LEVOTHROID) 200 MCG tablet Take 200 mcg by mouth daily.      . metFORMIN (GLUCOPHAGE) 500 MG tablet Take 1 tablet (500 mg total) by mouth 2 (two) times daily with a meal.  180 tablet  1  . potassium  chloride (K-DUR) 10 MEQ tablet Take 1 tablet (10 mEq total) by mouth 2 (two) times daily.  90 tablet  1  . [DISCONTINUED] amLODipine (NORVASC) 10 MG tablet Take 1 tablet (10 mg total) by mouth daily.  90 tablet  1  . [DISCONTINUED] furosemide (LASIX) 20 MG tablet Take 1 tablet (20 mg total) by mouth daily.  30 tablet  11  . [DISCONTINUED] losartan-hydrochlorothiazide (HYZAAR) 100-25 MG per tablet Take 1 tablet by mouth daily.  90 tablet  1  . furosemide (LASIX) 40 MG tablet Take 1 tablet (40 mg total) by mouth 2 (two) times daily.  60 tablet  6  . hydrALAZINE (APRESOLINE) 25 MG tablet Take 1 tablet (25 mg total) by mouth 3 (three) times daily.  90 tablet  6  . losartan (COZAAR) 100 MG tablet Take 1 tablet (100 mg total) by mouth daily.  30 tablet  3  . spironolactone (ALDACTONE) 25 MG tablet Take 0.5 tablets (12.5 mg total) by mouth daily.  15 tablet  6  . [DISCONTINUED] zolpidem (AMBIEN) 5 MG tablet Take 1 tablet (5 mg total) by mouth at bedtime as needed for sleep.  30 tablet  2   No facility-administered encounter medications on file as of 07/16/2012.    Allergies  Allergen Reactions  . Codeine     Past Medical History  Diagnosis Date  . Hypertension   . Personal history of goiter   . S/P thyroidectomy   .  S/P radioactive iodine thyroid ablation   . Iatrogenic thyroiditis   . Non-ischemic cardiomyopathy     EF 28%- reassessment of LV function 2011 with LVEf 45-50%  . History of cardiac cath 05/2007    normal-with patent coronaries  . PAD (peripheral artery disease)     lower extremities with ABIs of 0.5 bilaterally  . History of colonoscopy     ROS: Negative except as per HPI  BP 159/90  Pulse 91  Ht 5\' 10"  (1.778 m)  Wt 105.235 kg (232 lb)  BMI 33.29 kg/m2  PHYSICAL EXAM: Pt is alert and oriented, NAD HEENT: normal Neck: JVP - moderately elevated Lungs: CTA bilaterally CV: RRR with an S3 gallop present, no murmur Abd: soft, NT, Positive BS, no hepatomegaly Ext: no  C/C/E, distal pulses intact and equal Skin: warm/dry no rash  2-D echo: Left ventricle: LVEF is approximately 25% with diffuse hypokinesis; inferior akinesis. The cavity size was normal. Wall thickness was increased in a pattern of severe LVH. Doppler parameters are consistent with abnormal left ventricular relaxation (grade 1 diastolic dysfunction).  ------------------------------------------------------------ Aortic valve: Structurally normal valve. Cusp separation was normal. Doppler: Transvalvular velocity was within the normal range. There was no stenosis. No regurgitation.  ------------------------------------------------------------ Mitral valve: Mildly thickened leaflets . Doppler: Moderate regurgitation.  ------------------------------------------------------------ Left atrium: The atrium was moderately dilated.  ------------------------------------------------------------ Right ventricle: The cavity size was normal. Systolic function was normal.  ------------------------------------------------------------ Pulmonic valve: Structurally normal valve. Cusp separation was normal. Doppler: Transvalvular velocity was within the normal range. No regurgitation.  ------------------------------------------------------------ Tricuspid valve: Structurally normal valve. Leaflet separation was normal. Doppler: Transvalvular velocity was within the normal range. No regurgitation.  ------------------------------------------------------------ Right atrium: The atrium was normal in size.  ------------------------------------------------------------ Pericardium: A trivial pericardial effusion was identified.  ------------------------------------------------------------  2D measurements Normal Doppler measurements Norma Left ventricle l LVID ED, 51 mm 43-52 Main pulmonary artery chord, Pressure, 17 mm Hg =30 PLAX S LVID ES, 40.7 mm 23-38 Left ventricle chord, Ea, lat 9.5 cm/s  ----- PLAX ann, tiss 5 FS, chord, 20 % >29 DP PLAX E/Ea, lat 6.9 ----- LVPW, ED 17.3 mm ------ ann, tiss 7 IVS/LVPW 1.06 <1.3 DP ratio, ED Ea, med 9.8 cm/s ----- Ventricular septum ann, tiss 5 IVS, ED 18.4 mm ------ DP LVOT E/Ea, med 6.7 ----- Diam, S 21 mm ------ ann, tiss 6 Area 3.46 cm^2 ------ DP Diam 21 mm ------ LVOT Aorta Peak vel, 68. cm/s ----- Root diam, 36 mm ------ S 4 ED VTI, S 13. cm ----- Left atrium 6 AP dim 47 mm ------ HR 86 bpm ----- AP dim 2.09 cm/m^2 <2.2 Stroke vol 47. ml ----- index 1 Cardiac 4.1 L/min ----- output Cardiac 1.8 L/(min-m ----- index ^2) Stroke 20. ml/m^2 ----- index 9 Mitral valve Peak E vel 66. cm/s ----- 6 Peak A vel 82. cm/s ----- 4 Decelerati 158 ms 150-2 on time 30 Peak E/A 0.8 ----- ratio Tricuspid valve Regurg 176 cm/s ----- peak vel Peak RV-RA 12 mm Hg ----- gradient, S Systemic veins Estimated 5 mm Hg ----- CVP Right ventricle Pressure, 17 mm Hg <30 S Sa vel, 8.1 cm/s -----  ann, 9 tiss DP  Cardiac catheterization 2009: HEMODYNAMICS: Right atrium mean of 7, right ventricle 32/6. Pulmonary  artery 32/15. Coronary capillary wedge pressure mean of 19. Arterial  saturation 94%. Pulmonary artery saturation 53%. Aorta 144/79. Left  ventricle 150/18. Cardiac output 4.2 by thermodilution method. Cardiac  index of 1.9 by thermodilution method.  ANGIOGRAPHIC FINDINGS:  1. There is calcification in the area of the left main coronary artery  although no obstructive stenosis is noted. This vessel gives rise  to left anterior descending and circumflex vessels.  2. The left anterior descending is a moderate-to-large size vessel  wrapping the apex. There are two larger diagonal branches and  three smaller diagonal branches noted along the course of the left  anterior descending. There is 30% stenosis within the proximal  left anterior descending. A fairly large proximal diagonal branch  is visualized with approximately  50% ostial stenosis. Otherwise,  there are minor luminal irregularities within the left anterior  descending including 20% midvessel stenosis.  3. There is a medium caliber circumflex vessel with two obtuse  marginal branches. No significant flow-limiting stenosis is noted.  4. Right coronary artery is medium caliber and dominant, supplying the  posterior descending branch. There are minor luminal  irregularities including a more focal 30% stenosis within the mid  vessel.  Left ventriculography was performed in the RAO projection and revealed  ejection fraction of approximately 35% with diffuse hypokinesis and 2+  mitral regurgitation in the setting of ventricular ectopy.  Diagnoses  1. Mild coronary atherosclerosis as outlined including approximately  30% proximal left anterior descending stenosis and 30% mid-right  coronary artery stenosis. No obstructive disease is noted.  2. Mean pulmonary capillary wedge pressure of 19 with normal pulmonary  artery systolic pressure and a cardiac output of 4.2 by the  thermodilution method.  3. Left ventricular ejection fraction of approximately 35% with 2+  mitral regurgitation in the setting of ventricular ectopy and a  left ventricular end-diastolic pressure of 18 mmHg.  DISCUSSION: I reviewed the results with the patient. At this point I  would anticipate ongoing medical therapy and continued treatment of  hypothyroid state. At this point there is no clear revascularization  option.  ASSESSMENT AND PLAN: 1. Acute on chronic systolic heart failure. Echocardiogram reviewed with severe concentric LVH and severe LV dysfunction. Her cardiac catheterization done several years ago was reviewed as well. Blood pressure remains uncontrolled. Multiple medicine changes to be made as below:   Stop amlodipine  Stop hydrochlorothiazide  Increase furosemide to 40 mg twice daily  Add hydralazine 25 mg 3 times a day  Add Aldactone 12.5 mg daily   We  also discussed salt restriction, complete tobacco cessation, and the importance of daily weights and close followup. I think she would be well served to followup in the advanced heart failure clinic and a referral will be made.  2. Coronary artery disease, nonobstructive. Cardiac catheterization was several years ago. She did have nonobstructive disease. I will deferred to Dr. Haroldine Laws whether he thinks repeat right left heart cath is indicated. I think her LV dysfunction is probably related to uncontrolled hypertension and hopefully will improve with more aggressive medical therapy.  3. Peripheral arterial disease with known SFA occlusion. No claudication symptoms at present. She is primarily limited by congestive heart failure at this time.  4. Uncontrolled malignant hypertension. Medication changes as outlined above. She will continue to require titration of her medical therapy.  [name] 07/16/2012 10:35 AM

## 2012-08-01 ENCOUNTER — Ambulatory Visit (HOSPITAL_COMMUNITY)
Admission: RE | Admit: 2012-08-01 | Discharge: 2012-08-01 | Disposition: A | Discharge status: Home / Self Care | Payer: Medicare PPO | Source: Ambulatory Visit | Attending: Internal Medicine | Admitting: Internal Medicine

## 2012-08-01 ENCOUNTER — Encounter (HOSPITAL_COMMUNITY): Payer: Self-pay

## 2012-08-01 VITALS — BP 143/80 | HR 85 | Wt 227.8 lb

## 2012-08-01 DIAGNOSIS — I5022 Chronic systolic (congestive) heart failure: Secondary | ICD-10-CM

## 2012-08-01 DIAGNOSIS — I509 Heart failure, unspecified: Secondary | ICD-10-CM | POA: Insufficient documentation

## 2012-08-01 LAB — BASIC METABOLIC PANEL
BUN: 19 mg/dL (ref 6–23)
CO2: 23 mEq/L (ref 19–32)
Chloride: 107 mEq/L (ref 96–112)
Creatinine, Ser: 1.22 mg/dL — ABNORMAL HIGH (ref 0.50–1.10)
GFR calc Af Amer: 52 mL/min — ABNORMAL LOW (ref >90)
Potassium: 3.8 mEq/L (ref 3.5–5.1)

## 2012-08-01 MED ORDER — SPIRONOLACTONE 25 MG PO TABS: 25.0000 mg | ORAL_TABLET | Freq: Every day | ORAL | Status: DC

## 2012-08-01 NOTE — Assessment & Plan Note (Addendum)
She presents as a new patient today at the request of Dr Burt Knack. NYHA IIIB. Volume status stable. Increase spironolactone 25 mg daily. Instructed to take and additional 40 mg of lasix if your weight is 226 pounds or greater. Refer to EP  for ICD as EF has been down since 2009. Check BMET today. Reinforced limiting fluid to < 2 liters per day, low salt food choices, and medication compliance. Discussed advanced therapies such as LVAD and transplant however she does not want to pursue. At next visit will need to consider CPX. Follow up 3 weeks.   Patient seen and examined with Darrick Grinder, NP. We discussed all aspects of the encounter. I agree with the assessment and plan as stated above.  Volume status improved with recent addition of meds by Dr. Burt Knack. However still with NYHA IIIb symptoms despite a good medication regimen. Will increase spiro. Extensive amount of time spent on HF education with special attention to dietary restriction, daily weights and use of sliding scale diuretics. We discussed the possibility of advanced therapies at some point but she is not interested in considering VAD or transplant. Thus will not pursue CPX at this point. See back in 3-4 weeks.

## 2012-08-01 NOTE — Progress Notes (Signed)
Patient ID: Paula Stein, female   DOB: [birth year], 68 y.o.   MRN: [id number] Cardiologist: Dr Burt Knack PCP: Dr Shawna Orleans  HPI: Paula Stein is a 67 year old with NICM RHC/LHC [id number], chronnc systolic heart failure, S/P Thyroidectomy, S/P radioactive thyroid ablation, DM, HTN, PAD ABIs 0.5 bilaterally, and current smoker. She is referred to HF clinic by Dr Burt Knack due to heart failure.   LHC/RHC 2009  RA7 RV 32/6.   PAy 32/15.  PCWP 19.  CO 4.2 CI  1.9  Mild coronary atherosclerosis as outlined including approximately  30% proximal left anterior descending stenosis and 30% mid-right  coronary artery stenosis. No obstructive disease is noted.   04/10/2007 ECHO EF 25% 04/22/2009 ECHO EF 45-50% 07/11/12 ECHO EF 123456 Grade I Diastolic Dysfunction  Evaluated at [facility] ED 06/25/12 for dyspnea. Given IV lasix.  She was started on lasix 20 mg daily bid. Weight at that time was 232 pounds.   She presents as new patient. Last visit with Dr Burt Knack 07/16/12 hydralazine, lasix, and Spironolactone added. She is down 5 pounds. Complains of dyspnea with exertion. Denies PND/CP. +Orthopnea. Sleeps poorly. Weight at home 223 pounds. Drinking > 2 liters of fluid per day. Eating high salt foods such as french fries.    Review of Systems:     Cardiac Review of Systems: {Y] = yes [ ]  = no  Chest Pain [    ]  Resting SOB [ Y ] Exertional SOB  [ Y ]  Orthopnea [ Y ]   Pedal Edema [   ]    Palpitations [  ] Syncope  [  ]   Presyncope [   ]  General Review of Systems: [Y] = yes [  ]=no Constitional: recent weight change [  Y]; anorexia [  ]; fatigue [Y  ]; nausea [  ]; night sweats [  ]; fever [  ]; or chills [  ];                                                                                                                                          Dental: poor dentition[  ];  Eye : blurred vision [  ]; diplopia [   ]; vision changes [  ];  Amaurosis fugax[  ]; Resp: cough [  ];  wheezing[  ];  hemoptysis[  ]; shortness of breath[  ];  paroxysmal nocturnal dyspnea[  ]; dyspnea on exertion[ Y ]; or orthopnea[ Y ];  GI:  gallstones[  ], vomiting[  ];  dysphagia[  ]; melena[  ];  hematochezia [  ]; heartburn[  ];    GU: kidney stones [  ]; hematuria[  ];   dysuria [  ];  nocturia[  ];  history of     obstruction [  ];  Skin: rash, swelling[  ];, hair loss[  ];  peripheral edema[  ];  or itching[  ]; Musculosketetal: myalgias[  ];  joint swelling[  ];  joint erythema[  ];  joint pain[ Y ];  back pain[  ];  Heme/Lymph: bruising[  ];  bleeding[  ];  anemia[  ];  Neuro: TIA[  ];  headaches[  ];  stroke[  ];  vertigo[  ];  seizures[  ];   paresthesias[  ];  difficulty walking[  ];  Psych:depression[  ]; anxiety[  ];  Endocrine: diabetes[  Y];  thyroid dysfunction[  Y];  Immunizations: Flu [  ]; Pneumococcal[  ];  Other:    Past Medical History  Diagnosis Date  . Hypertension   . Personal history of goiter   . S/P thyroidectomy   . S/P radioactive iodine thyroid ablation   . Iatrogenic thyroiditis   . Non-ischemic cardiomyopathy     EF 28%- reassessment of LV function 2011 with LVEf 45-50%  . History of cardiac cath 05/2007    normal-with patent coronaries  . PAD (peripheral artery disease)     lower extremities with ABIs of 0.5 bilaterally  . History of colonoscopy     Current Outpatient Prescriptions  Medication Sig Dispense Refill  . aspirin 81 MG tablet Take 81 mg by mouth daily.        Marland Kitchen atorvastatin (LIPITOR) 40 MG tablet Take 1 tablet (40 mg total) by mouth daily.  90 tablet  1  . carvedilol (COREG) 25 MG tablet Take 1 tablet (25 mg total) by mouth 2 (two) times daily with a meal.  180 tablet  1  . furosemide (LASIX) 40 MG tablet Take 1 tablet (40 mg total) by mouth 2 (two) times daily.  60 tablet  6  . glucose blood (ONE TOUCH TEST STRIPS) test strip Use as instructed to check blood sugar once a day       . hydrALAZINE (APRESOLINE) 25 MG tablet Take 1 tablet (25 mg total) by mouth 3 (three) times  daily.  90 tablet  6  . levothyroxine (SYNTHROID, LEVOTHROID) 200 MCG tablet Take 200 mcg by mouth daily.      Marland Kitchen losartan (COZAAR) 100 MG tablet Take 1 tablet (100 mg total) by mouth daily.  30 tablet  3  . metFORMIN (GLUCOPHAGE) 500 MG tablet Take 1 tablet (500 mg total) by mouth 2 (two) times daily with a meal.  180 tablet  1  . potassium chloride (K-DUR) 10 MEQ tablet Take 1 tablet (10 mEq total) by mouth 2 (two) times daily.  90 tablet  1  . spironolactone (ALDACTONE) 25 MG tablet Take 0.5 tablets (12.5 mg total) by mouth daily.  15 tablet  6   No current facility-administered medications for this encounter.     Allergies  Allergen Reactions  . Codeine     History   Social History  . Marital Status: Divorced    Spouse Name: N/A    Number of Children: 1  . Years of Education: N/A   Occupational History  . Retired     Education officer, museum   Social History Main Topics  . Smoking status: Light Tobacco Smoker -- 0.25 packs/day    Last Attempt to Quit: 01/24/2012  . Smokeless tobacco: Not on file  . Alcohol Use: No  . Drug Use: No  . Sexually Active: Not Currently   Other Topics Concern  . Not on file   Social History Narrative  Retired Education officer, museum   Divorced - one grown son   current smoker    Alcohol use-no      Drug use-no    Regular exercise-yes        son Kyasia Alworth    Family History  Problem Relation Age of Onset  . Diabetes type II Mother   . Hypertension Mother   . Diabetes Mother   . Other Father     deceased from accident age 37    PHYSICAL EXAM: Filed Vitals:   08/01/12 1426  BP: 143/80  Pulse: 85   General:  Well appearing. No respiratory difficulty HEENT: normal Neck: supple. no JVD. Carotids 2+ bilat; no bruits. No lymphadenopathy or thryomegaly appreciated. Cor: PMI nondisplaced. Regular rate & rhythm. No rubs, gallops or murmurs. Lungs: clear Abdomen: soft, nontender, nondistended. No hepatosplenomegaly. No bruits or masses. Good  bowel sounds. Extremities: no cyanosis, clubbing, rash, edema Neuro: alert & oriented x 3, cranial nerves grossly intact. moves all 4 extremities w/o difficulty. Affect pleasant.    No results found for this or any previous visit (from the past 24 hour(s)). No results found.   ASSESSMENT & PLAN:

## 2012-08-01 NOTE — Patient Instructions (Addendum)
Follow up in 3 weeks  Take Spironolactone 25 mg daily  Take an additional 40 mg of lasix if your weight is 226 pounds  Do the following things EVERYDAY: 1) Weigh yourself in the morning before breakfast. Write it down and keep it in a log. 2) Take your medicines as prescribed 3) Eat low salt foods-Limit salt (sodium) to 2000 mg per day.  4) Stay as active as you can everyday 5) Limit all fluids for the day to less than 2 liters

## 2012-08-22 ENCOUNTER — Encounter (HOSPITAL_COMMUNITY): Payer: Medicare PPO

## 2012-08-29 ENCOUNTER — Institutional Professional Consult (permissible substitution): Payer: Medicare PPO | Admitting: Internal Medicine

## 2012-09-03 ENCOUNTER — Ambulatory Visit (HOSPITAL_COMMUNITY)
Admission: RE | Admit: 2012-09-03 | Discharge: 2012-09-03 | Disposition: A | Discharge status: Home / Self Care | Payer: Medicare PPO | Source: Ambulatory Visit | Attending: Internal Medicine | Admitting: Internal Medicine

## 2012-09-03 ENCOUNTER — Encounter (HOSPITAL_COMMUNITY): Payer: Self-pay

## 2012-09-03 VITALS — BP 160/88 | HR 86 | Wt 228.8 lb

## 2012-09-03 DIAGNOSIS — I2510 Atherosclerotic heart disease of native coronary artery without angina pectoris: Secondary | ICD-10-CM | POA: Insufficient documentation

## 2012-09-03 DIAGNOSIS — I509 Heart failure, unspecified: Secondary | ICD-10-CM

## 2012-09-03 DIAGNOSIS — I10 Essential (primary) hypertension: Secondary | ICD-10-CM | POA: Insufficient documentation

## 2012-09-03 DIAGNOSIS — I5022 Chronic systolic (congestive) heart failure: Secondary | ICD-10-CM | POA: Insufficient documentation

## 2012-09-03 DIAGNOSIS — E119 Type 2 diabetes mellitus without complications: Secondary | ICD-10-CM | POA: Insufficient documentation

## 2012-09-03 DIAGNOSIS — F17200 Nicotine dependence, unspecified, uncomplicated: Secondary | ICD-10-CM | POA: Insufficient documentation

## 2012-09-03 MED ORDER — SPIRONOLACTONE 25 MG PO TABS: 12.5000 mg | ORAL_TABLET | Freq: Every day | ORAL | Status: DC

## 2012-09-03 MED ORDER — HYDRALAZINE HCL 25 MG PO TABS: 37.5000 mg | ORAL_TABLET | Freq: Three times a day (TID) | ORAL | Status: DC

## 2012-09-03 NOTE — Patient Instructions (Addendum)
If your weight 229 pounds take an extra 40 mg of lasix.   Take hydralazine 37.5 mg three times a day  Follow up in 1 month  Please start Sliver Slippers  Do the following things EVERYDAY: 1) Weigh yourself in the morning before breakfast. Write it down and keep it in a log. 2) Take your medicines as prescribed 3) Eat low salt foods-Limit salt (sodium) to 2000 mg per day.  4) Stay as active as you can everyday 5) Limit all fluids for the day to less than 2 liters

## 2012-09-03 NOTE — Progress Notes (Signed)
Patient ID: Paula Stein, female   DOB: May 01, 1945, 67 y.o.   MRN: [id number] Cardiologist: Dr Paula Stein PCP: Dr Paula Stein  HPI: Paula Stein is a 67 year old with NICM RHC/LHC [id number], chronnc systolic heart failure, Hyperthyroidism s/p Thyroidectomy and I-131thyroid ablation, DM, HTN, PAD ABIs 0.5 bilaterally, and current smoker. She has been followed by Dr. Burt Stein.   Last Cath 2009  Mild coronary atherosclerosis as outlined including approximately  30% proximal left anterior descending stenosis and 30% mid-right  coronary artery stenosis. No obstructive disease is noted.   04/10/2007 ECHO EF 25% 04/22/2009 ECHO EF 45-50% 07/11/12 ECHO EF 123456 Grade I Diastolic Dysfunction  Evaluated at [facility] ED 06/25/12 for dyspnea. Given IV lasix.  She was started on lasix 20 mg daily bid. Weight at that time was 232 pounds.   She returns for follow up. Last visit spironolactone increased to 25 mg however she continued on 12.5 mg. Overall she feels great. Breathing better. Denies SOB/PND/Orthopnea. Breathing improved going up steps. Able to walk to the back of the grocery store. Weight at home 224-225 pounds. Had ham over the weekend. Smoking 1 pack per week. She has an appointment scheduled with Dr Paula Stein to discuss ICD.                     Past Medical History  Diagnosis Date  . Hypertension   . Personal history of goiter   . S/P thyroidectomy   . S/P radioactive iodine thyroid ablation   . Iatrogenic thyroiditis   . Non-ischemic cardiomyopathy     EF 28%- reassessment of LV function 2011 with LVEf 45-50%  . History of cardiac cath 05/2007    normal-with patent coronaries  . PAD (peripheral artery disease)     lower extremities with ABIs of 0.5 bilaterally  . History of colonoscopy     Current Outpatient Prescriptions  Medication Sig Dispense Refill  . aspirin 81 MG tablet Take 81 mg by mouth daily.        Marland Kitchen atorvastatin (LIPITOR) 40 MG tablet Take 1 tablet (40 mg total) by mouth daily.  90 tablet  1  . carvedilol  (COREG) 25 MG tablet Take 1 tablet (25 mg total) by mouth 2 (two) times daily with a meal.  180 tablet  1  . furosemide (LASIX) 40 MG tablet Take 1 tablet (40 mg total) by mouth 2 (two) times daily.  60 tablet  6  . glucose blood (ONE TOUCH TEST STRIPS) test strip Use as instructed to check blood sugar once a day       . hydrALAZINE (APRESOLINE) 25 MG tablet Take 1 tablet (25 mg total) by mouth 3 (three) times daily.  90 tablet  6  . levothyroxine (SYNTHROID, LEVOTHROID) 200 MCG tablet Take 200 mcg by mouth daily.      Marland Kitchen losartan (COZAAR) 100 MG tablet Take 1 tablet (100 mg total) by mouth daily.  30 tablet  3  . metFORMIN (GLUCOPHAGE) 500 MG tablet Take 1 tablet (500 mg total) by mouth 2 (two) times daily with a meal.  180 tablet  1  . potassium chloride (K-DUR) 10 MEQ tablet Take 1 tablet (10 mEq total) by mouth 2 (two) times daily.  90 tablet  1  . spironolactone (ALDACTONE) 25 MG tablet Take 1 tablet (25 mg total) by mouth daily.  30 tablet  6   No current facility-administered medications for this encounter.     Allergies  Allergen Reactions  . Codeine  History   Social History  . Marital Status: Divorced    Spouse Name: N/A    Number of Children: 1  . Years of Education: N/A   Occupational History  . Retired     Education officer, museum   Social History Main Topics  . Smoking status: Light Tobacco Smoker -- 0.25 packs/day    Last Attempt to Quit: 01/24/2012  . Smokeless tobacco: Not on file  . Alcohol Use: No  . Drug Use: No  . Sexually Active: Not Currently   Other Topics Concern  . Not on file   Social History Narrative   Retired Education officer, museum   Divorced - one grown son   current smoker    Alcohol use-no      Drug use-no    Regular exercise-yes        son Paula Stein    Family History  Problem Relation Age of Onset  . Diabetes type II Mother   . Hypertension Mother   . Diabetes Mother   . Other Father     deceased from accident age 56    PHYSICAL  EXAM: Filed Vitals:   09/03/12 1031  BP: 160/88  Pulse: 86   General:  Well appearing. No respiratory difficulty HEENT: normal Neck: supple. JVD 5-6 Carotids 2+ bilat; no bruits. No lymphadenopathy or thryomegaly appreciated. Cor: PMI nondisplaced. Regular rate & rhythm. No rubs, gallops or murmurs. Lungs: clear Abdomen: obese,  soft, nontender, nondistended. No hepatosplenomegaly. No bruits or masses. Good bowel sounds. Extremities: no cyanosis, clubbing, rash, edema Neuro: alert & oriented x 3, cranial nerves grossly intact. moves all 4 extremities w/o difficulty. Affect pleasant.    No results found for this or any previous visit (from the past 24 hour(s)). No results found.   ASSESSMENT & PLAN:

## 2012-09-03 NOTE — Assessment & Plan Note (Addendum)
NYHA II. Remains SOB with inclined surfaces. Volume status stable. Increase hydralazine to 37.5 mg tid. Continue current diuretic regimen and she instructed to take an extra 40 mg of lasix if her weight is 229 pounds or greater. She will follow up with Dr [name] June 10 for possible ICD. Reinforce daily weights, low salt food choices, and limiting fluid intake to < 2 liters per day. Follow up in 1 month.   Patient seen and examined with Darrick Grinder, NP. We discussed all aspects of the encounter. I agree with the assessment and plan as stated above. She is symptomatically improved. We continued to reinforce need for dietary compliance and use of sliding scale diuretics. Has f/u with EP to discuss ICD. Given comorbidities would likely not be candidate for advanced therapies. We will continue to follow closely to f/u on HF teaching.

## 2012-09-10 ENCOUNTER — Encounter: Payer: Self-pay | Admitting: Internal Medicine

## 2012-09-10 ENCOUNTER — Ambulatory Visit (INDEPENDENT_AMBULATORY_CARE_PROVIDER_SITE_OTHER): Payer: Medicare PPO | Admitting: Internal Medicine

## 2012-09-10 VITALS — BP 160/90 | HR 89 | Ht 71.0 in | Wt 224.2 lb

## 2012-09-10 DIAGNOSIS — G4731 Primary central sleep apnea: Secondary | ICD-10-CM | POA: Insufficient documentation

## 2012-09-10 DIAGNOSIS — G4730 Sleep apnea, unspecified: Secondary | ICD-10-CM

## 2012-09-10 DIAGNOSIS — I70219 Atherosclerosis of native arteries of extremities with intermittent claudication, unspecified extremity: Secondary | ICD-10-CM

## 2012-09-10 DIAGNOSIS — R0683 Snoring: Secondary | ICD-10-CM

## 2012-09-10 DIAGNOSIS — G478 Other sleep disorders: Secondary | ICD-10-CM

## 2012-09-10 DIAGNOSIS — I10 Essential (primary) hypertension: Secondary | ICD-10-CM

## 2012-09-10 DIAGNOSIS — I5022 Chronic systolic (congestive) heart failure: Secondary | ICD-10-CM

## 2012-09-10 DIAGNOSIS — R0609 Other forms of dyspnea: Secondary | ICD-10-CM

## 2012-09-10 DIAGNOSIS — I428 Other cardiomyopathies: Secondary | ICD-10-CM

## 2012-09-10 MED ORDER — ISOSORBIDE MONONITRATE ER 30 MG PO TB24: 30.0000 mg | ORAL_TABLET | Freq: Every day | ORAL | Status: DC

## 2012-09-10 NOTE — Patient Instructions (Signed)
Your physician has recommended you make the following change in your medication:  1) Start imdur (isosorbide) 30 mg one tablet by mouth once daily  Your physician recommends that you return for lab work in: 1 week- Paula Stein has recommended that you have a sleep study. This test records several body functions during sleep, including: brain activity, eye movement, oxygen and carbon dioxide blood levels, heart rate and rhythm, breathing rate and rhythm, the flow of air through your mouth and nose, snoring, body muscle movements, and chest and belly movement.

## 2012-09-10 NOTE — Assessment & Plan Note (Signed)
The patient has nonischemic cardiomyopathy significant interval worsening of LV ejection fraction. However, there was a dearth of appropriate medical therapy in the interval 2011-14 such that she is only taking carvedilol. I would thus wonder whether there isn't some hope for improvement in LV EF   as she is returned to appropriate medical therapy under the direction of the heart failure clinic. She has been started on an ARB. She is also on Aldactone. She is on hydralazine. I will take the liberty of beginning her on nitrates. Reassessment of ejection fraction in  3-6 months seems appropriate prior to making a decision regarding ICD implantation. There is interval risk

## 2012-09-10 NOTE — Assessment & Plan Note (Signed)
She is euvolemic. She is on Aldactone. We will check her metabolic profile next week to assess potassium levels and she is also potassium supplementation

## 2012-09-10 NOTE — Progress Notes (Signed)
ELECTROPHYSIOLOGY CONSULT NOTE  Patient ID: Paula Stein, MRN: [id number], DOB/AGE: [birth year] 67 y.o. Admit date: (Not on file) Date of Consult: 09/10/2012  Primary Physician: [name], DO Primary Cardiologist: [facility]  Chief Complaint: ? ICD    HPI Paula Stein is a 67 y.o. female referred for consideration of ICD implantation.  She has a history of nonischemic cardiomyopathy initially in the 28% range the interval 2011 showed a 45-50% by echo  She was then lost to followup. She presented to the emergency room with congestive heart failure this spring. Repeat echo April 14 demonstrated EF 25% with a variety of wall motion abnormalities and left ventricular hypertrophy-severe. Moderate mitral regurgitation and  moderate left atrial enlargement  In the interval between 2011 and 2014 she took carvedilol. She is also on amlodipine and hydrochlorothiazide.  She snores significantly according to reports secondhand from her from her son.  She has modest dyspnea on exertion. She sleeps on 3 pillows when she awakens in the middle of the night mostly within scattered  And her  not being  Dyspneic, She has not had edema now or before. She has a history of remote syncope but no palpitations.    She is given his dyspnea surgeon Past Medical History  Diagnosis Date  . Hypertension   . Personal history of goiter   . S/P thyroidectomy   . S/P radioactive iodine thyroid ablation   . Iatrogenic thyroiditis   . Non-ischemic cardiomyopathy     EF 28%- reassessment of LV function 2011 with LVEf 45-50%  . History of cardiac cath 05/2007    normal-with patent coronaries  . PAD (peripheral artery disease)     lower extremities with ABIs of 0.5 bilaterally  . History of colonoscopy       Surgical History:  Past Surgical History  Procedure Laterality Date  . Total abdominal hysterectomy    . Thyroidectomy    . Tonsillectomy       Home Meds: Prior to Admission medications   Medication Sig  Start Date End Date Taking? Authorizing Provider  aspirin 81 MG tablet Take 81 mg by mouth daily.     Yes Historical Provider, MD  atorvastatin (LIPITOR) 40 MG tablet Take 1 tablet (40 mg total) by mouth daily. 01/17/12 01/16/13 Yes Doe-Hyun R Shawna Orleans, DO  carvedilol (COREG) 25 MG tablet Take 1 tablet (25 mg total) by mouth 2 (two) times daily with a meal. 07/05/12  Yes Doe-Hyun R Shawna Orleans, DO  furosemide (LASIX) 40 MG tablet Take 1 tablet (40 mg total) by mouth 2 (two) times daily. 07/16/12  Yes [name], MD  glucose blood (ONE TOUCH TEST STRIPS) test strip Use as instructed to check blood sugar once a day    Yes Historical Provider, MD  hydrALAZINE (APRESOLINE) 25 MG tablet Take 1.5 tablets (37.5 mg total) by mouth 3 (three) times daily. 09/03/12  Yes Amy D Clegg, NP  levothyroxine (SYNTHROID, LEVOTHROID) 200 MCG tablet Take 200 mcg by mouth daily.   Yes Historical Provider, MD  losartan (COZAAR) 100 MG tablet Take 1 tablet (100 mg total) by mouth daily. 07/16/12  Yes [name], MD  metFORMIN (GLUCOPHAGE) 500 MG tablet Take 1 tablet (500 mg total) by mouth 2 (two) times daily with a meal. 07/05/12  Yes Doe-Hyun R Shawna Orleans, DO  potassium chloride (K-DUR) 10 MEQ tablet Take 1 tablet (10 mEq total) by mouth 2 (two) times daily. 07/05/12  Yes Doe-Hyun R Shawna Orleans, DO  spironolactone (ALDACTONE) 25 MG  tablet Take 0.5 tablets (12.5 mg total) by mouth daily. 09/03/12  Yes Amy Estrella Deeds, NP      Allergies:  Allergies  Allergen Reactions  . Codeine     History   Social History  . Marital Status: Divorced    Spouse Name: N/A    Number of Children: 1  . Years of Education: N/A   Occupational History  . Retired     Education officer, museum   Social History Main Topics  . Smoking status: Light Tobacco Smoker -- 0.25 packs/day    Last Attempt to Quit: 01/24/2012  . Smokeless tobacco: Not on file  . Alcohol Use: No  . Drug Use: No  . Sexually Active: Not Currently   Other Topics Concern  . Not on file   Social History  Narrative   Retired Education officer, museum   Divorced - one grown son   current smoker    Alcohol use-no      Drug use-no    Regular exercise-yes        son Paula Stein     Family History  Problem Relation Age of Onset  . Diabetes type II Mother   . Hypertension Mother   . Diabetes Mother   . Other Father     deceased from accident age 19     ROS:  Please see the history of present illness.   She has fatigue; GE reflux disease anxiety/depression and arthritis  All other systems reviewed and negative.    Physical Exam:   Blood pressure 160/90, pulse 89, height 5\' 11"  (1.803 m), weight 224 lb 3.2 oz (101.696 kg). General: Well developed, well nourished age appearing African American of female in no acute distress. Head: Normocephalic, atraumatic, sclera non-icteric, no xanthomas, nares are without discharge. EENT: normal Lymph Nodes:  none Back: without scoliosis/kyphosis , no CVA tendersness Neck: Negative for carotid bruits. JVD 6- 7 cm Lungs: Clear bilaterally to auscultation without wheezes, rales, or rhonchi. Breathing is unlabored. Heart: RRR with S1 S2. 2/6 systolic murmur , rubs, or gallops appreciated. Abdomen: Soft, non-tender, non-distended with normoactive bowel sounds. No hepatomegaly. No rebound/guarding. No obvious abdominal masses. Msk:  Strength and tone appear normal for age. Extremities: No clubbing or cyanosis. No edema.  Distal pedal pulses are 2+ and equal bilaterally. Skin: Warm and Dry Neuro: Alert and oriented X 3. CN III-XII intact Grossly normal sensory and motor function . Psych:  Responds to questions appropriately with a normal affect.      Labs: Cardiac Enzymes No results found for this basename: CKTOTAL, CKMB, TROPONINI,  in the last 72 hours CBC Lab Results  Component Value Date   WBC 7.6 06/25/2012   HGB 14.6 06/25/2012   HCT 43.0 06/25/2012   MCV 94.3 06/25/2012   PLT 245 06/25/2012   PROTIME: No results found for this basename: LABPROT,  INR,  in the last 72 hours Chemistry No results found for this basename: NA, K, CL, CO2, BUN, CREATININE, CALCIUM, LABALBU, PROT, BILITOT, ALKPHOS, ALT, AST, GLUCOSE,  in the last 168 hours Lipids Lab Results  Component Value Date   CHOL 182 02/01/2012   HDL 59.30 02/01/2012   LDLCALC 102* 02/01/2012   TRIG 105.0 02/01/2012   BNP Pro B Natriuretic peptide (BNP)  Date/Time Value Range Status  06/28/2012  1:51 PM 46.0  0.0 - 100.0 pg/mL Final  06/25/2012  7:10 AM 922.5* 0 - 125 pg/mL Final  01/28/2007  4:26 PM 49.9   Final  Miscellaneous Lab Results  Component Value Date   DDIMER  Value: 1.47        AT THE INHOUSE ESTABLISHED CUTOFF VALUE OF 0.48 ug/mL FEU, THIS ASSAY HAS BEEN DOCUMENTED IN THE LITERATURE TO HAVE* 01/28/2007    Radiology/Studies:  No results found.  EKG: sinus rhythm at 89 Intervals 20/09/37with a QTC of 45 Voltage for left ventricular hypertrophy   Assessment and Plan:    Virl Axe  He seems something to eat up tired

## 2012-09-10 NOTE — Assessment & Plan Note (Signed)
Poorly controlled. She has significant sleep disordered breathing. I recommended that she undergo sleep study. She is amenable.

## 2012-09-10 NOTE — Assessment & Plan Note (Signed)
As above.

## 2012-09-11 ENCOUNTER — Ambulatory Visit (HOSPITAL_BASED_OUTPATIENT_CLINIC_OR_DEPARTMENT_OTHER): Payer: Medicare PPO | Attending: Internal Medicine | Admitting: Radiology

## 2012-09-11 VITALS — Ht 70.0 in | Wt 224.0 lb

## 2012-09-11 DIAGNOSIS — G4710 Hypersomnia, unspecified: Secondary | ICD-10-CM | POA: Insufficient documentation

## 2012-09-11 DIAGNOSIS — I4949 Other premature depolarization: Secondary | ICD-10-CM | POA: Insufficient documentation

## 2012-09-11 DIAGNOSIS — R0683 Snoring: Secondary | ICD-10-CM

## 2012-09-17 ENCOUNTER — Other Ambulatory Visit (INDEPENDENT_AMBULATORY_CARE_PROVIDER_SITE_OTHER): Payer: Medicare PPO

## 2012-09-17 DIAGNOSIS — I70219 Atherosclerosis of native arteries of extremities with intermittent claudication, unspecified extremity: Secondary | ICD-10-CM

## 2012-09-18 LAB — BASIC METABOLIC PANEL
CO2: 23 mEq/L (ref 19–32)
Calcium: 9.7 mg/dL (ref 8.4–10.5)
Chloride: 113 mEq/L — ABNORMAL HIGH (ref 96–112)
Creatinine, Ser: 1.3 mg/dL — ABNORMAL HIGH (ref 0.4–1.2)
Glucose, Bld: 106 mg/dL — ABNORMAL HIGH (ref 70–99)

## 2012-09-22 DIAGNOSIS — G4737 Central sleep apnea in conditions classified elsewhere: Secondary | ICD-10-CM

## 2012-09-22 DIAGNOSIS — G4733 Obstructive sleep apnea (adult) (pediatric): Secondary | ICD-10-CM

## 2012-09-22 DIAGNOSIS — I4949 Other premature depolarization: Secondary | ICD-10-CM

## 2012-09-22 NOTE — Procedures (Signed)
NAMEANABIYA, Paula Stein                 ACCOUNT NO.:  [id number]  MEDICAL RECORD NO.:  [id number]          PATIENT TYPE:  OUT  LOCATION:  SLEEP CENTER                 FACILITY:  [facility]  PHYSICIAN:  Kathee Delton, MD,FCCPDATE OF BIRTH:  1944-01-16  DATE OF STUDY:  09/11/2012                           NOCTURNAL POLYSOMNOGRAM  REFERRING PHYSICIAN:  [name], MD, [facility]  LOCATION:  Sleep Lab.  INDICATION FOR STUDY:  Hypersomnia with sleep apnea.  EPWORTH SCORE:  3.  SLEEP ARCHITECTURE:  The patient had total sleep time of 268 minutes with no slow-wave sleep and decreased quantity of REM.  Sleep onset latency was normal at 30 minutes, and REM onset was normal at 40 minutes.  Sleep efficiency was 74% during the diagnostic portion of the study and 58% during the titration portion of the study.  RESPIRATORY DATA:  The patient underwent a split night protocol where she was found to have 144 obstructive and central events in the first 126 minutes of sleep.  This gave her an apnea-hypopnea index during the diagnostic portion of the study at 69 events per hour.  The events occurred in all body positions, and there was moderate snoring noted throughout.  It should also be noted that the patient had Cheyne-Stokes respirations during the night as well.  By protocol, the patient was then fitted with a medium ResMed AirFit F10 full facemask, and CPAP titration was initiated.  She was increased as high as 12 cm of water pressure, but it was clear that this was resulting in increased central apneas.  She was then changed to bilevel, and ultimately titrated to a final pressure of 17/13.  She had excellent control of both her obstructive and central events on this setting, and subsequently resulted in REM rebound.  OXYGEN DATA:  There was O2 desaturation as low as 81% with the patient's respiratory events.  CARDIAC DATA:  PVCs noted throughout the night.  MOVEMENT/PARASOMNIA:  The patient had  no significant leg jerks or other abnormal behaviors noted.  IMPRESSION/RECOMMENDATION: 1. Split night study reveals severe complex apnea, with an AHI of 69     events per hour and oxygen desaturation as low as 81%.  She was     also noted to have Cheyne-Stokes respirations.  As per split night     protocol, she was fitted with a medium ResMed AirFit F10 full     facemask, and did not respond well to CPAP therapy because of     worsening central apneas.  She was then changed to bilevel and     found to have an optimal pressure of 17/13.  This resulted in     excellent control of obstructive and central events even through     REM.  The patient should also     be encouraged to work aggressively on weight loss. 2. PVCs noted throughout the night.     Kathee Delton, MD,FCCP Diplomate, [place] Board of Sleep Medicine    [facility]/MEDQ  D:  09/22/2012 14:35:56  T:  09/22/2012 17:26:41  Job:  [id number]

## 2012-09-26 ENCOUNTER — Other Ambulatory Visit: Payer: Self-pay | Admitting: Internal Medicine

## 2012-09-26 DIAGNOSIS — G4730 Sleep apnea, unspecified: Secondary | ICD-10-CM

## 2012-10-02 ENCOUNTER — Ambulatory Visit (HOSPITAL_COMMUNITY): Payer: Medicare PPO

## 2012-10-10 ENCOUNTER — Other Ambulatory Visit: Payer: Self-pay

## 2012-10-23 ENCOUNTER — Encounter: Payer: Self-pay | Admitting: Pulmonary Disease

## 2012-10-23 ENCOUNTER — Ambulatory Visit (INDEPENDENT_AMBULATORY_CARE_PROVIDER_SITE_OTHER): Payer: Medicare PPO | Admitting: Pulmonary Disease

## 2012-10-23 VITALS — BP 170/84 | HR 83 | Temp 97.7°F | Ht 69.0 in | Wt 224.8 lb

## 2012-10-23 DIAGNOSIS — G4730 Sleep apnea, unspecified: Secondary | ICD-10-CM

## 2012-10-23 DIAGNOSIS — G4731 Primary central sleep apnea: Secondary | ICD-10-CM

## 2012-10-23 NOTE — Assessment & Plan Note (Signed)
The patient has severe obstructive and central sleep apnea, with Cheyne-Stokes respirations as well.  I have had a long discussion with her about complex sleep apnea, and how it is related to both her weight and her underlying cardiomyopathy.  She failed CPAP during the titration study, but responded extremely well to bilevel.  We'll go ahead and start her on this device, and I have also encouraged her to work aggressively on weight loss.  We will gradually increase her pressure to the optimal level, and she is to call us if she is having tolerance issues.

## 2012-10-23 NOTE — Progress Notes (Signed)
Subjective:    Patient ID: Paula Stein, female    DOB: [birth year], 67 y.o.   MRN: [id number]  HPI The patient is a 67 year old female who I've been asked to see for management of complex sleep apnea.  She has been noted to have loud snoring, as well as an abnormal pattern of breathing during the night.  She admits to having choking arousals every single night, as well as frequent awakenings.  She is never rested in the mornings upon arising, and has intermittent daytime sleepiness with inactivity.  However, she has significant sleepiness in the evening while trying to watch television or read.  She has no sleepiness with driving.  Of note, the patient states her weight is up 30 pounds over the last 2 years, and her Epworth score today is 7.  She has had a recent sleep study which showed an AHI of 69 events per hour with oxygen desaturation as low as 81%.  She had both central and obstructive events, as well as Cheyne-Stokes respirations.  The patient was started on CPAP as part of a split-night study, however could not tolerate this and resulted in worsening central events.  She was subsequently changed over to bilevel, and had an excellent response to a pressure of 17/13.   Sleep Questionnaire  What time do you typically go to bed?( Between what hours)  11pm 11pm at 1444 on 10/23/12 by Danella Maiers, CMA  How long does it take you to fall asleep?  2hrs 2hrs at 1444 on 10/23/12 by Danella Maiers, CMA  How many times during the night do you wake up?  3 3 at 1444 on 10/23/12 by Danella Maiers, CMA  What time do you get out of bed to start your day?  0700 0700 at 1444 on 10/23/12 by Danella Maiers, CMA  Do you drive or operate heavy machinery in your occupation?  No No at 1444 on 10/23/12 by Danella Maiers, CMA  How much has your weight changed (up or down) over the past two years? (In pounds)  30 lb (13.608 kg)30 lb (13.608 kg) increase at 1444 on 10/23/12 by Danella Maiers, CMA  Have you  ever had a sleep study before?   Yes Yes at 1444 on 10/23/12 by Danella Maiers, CMA  If yes, location of study?  [place] at 1444 on 10/23/12 by Danella Maiers, CMA  If yes, date of study?  08/2012 08/2012 at 1444 on 10/23/12 by Danella Maiers, CMA  Do you currently use CPAP?  No No at 1444 on 10/23/12 by Ivy Lynn Mabe, CMA  Do you wear oxygen at any time?  No No at 1444 on 10/23/12 by Ivy Lynn Mabe, CMA      Review of Systems  Constitutional: Negative for fever and unexpected weight change.  HENT: Negative for ear pain, nosebleeds, congestion, sore throat, rhinorrhea, sneezing, trouble swallowing, dental problem, postnasal drip and sinus pressure.   Eyes: Negative for redness and itching.  Respiratory: Negative for cough, chest tightness, shortness of breath and wheezing.   Cardiovascular: Negative for palpitations and leg swelling.  Gastrointestinal: Negative for nausea and vomiting.  Genitourinary: Negative for dysuria.  Musculoskeletal: Negative for joint swelling.  Skin: Negative for rash.  Neurological: Negative for headaches.  Hematological: Does not bruise/bleed easily.  Psychiatric/Behavioral: Negative for dysphoric mood. The patient is not nervous/anxious.        Objective:   Physical Exam Constitutional:  Obese female,  no acute distress  HENT:  Nares patent without discharge  Oropharynx without exudate, palate and uvula are normal  Eyes:  Perrla, eomi, no scleral icterus  Neck:  No JVD, no TMG  Cardiovascular:  Normal rate, regular rhythm, no rubs or gallops.  1/6 sem        Intact distal pulses  Pulmonary :  Normal breath sounds, no stridor or respiratory distress   No rales, rhonchi, or wheezing  Abdominal:  Soft, nondistended, bowel sounds present.  No tenderness noted.   Musculoskeletal:  No lower extremity edema noted.  Lymph Nodes:  No cervical lymphadenopathy noted  Skin:  No cyanosis noted  Neurologic:  Alert, appropriate, moves all  4 extremities without obvious deficit.         Assessment & Plan:

## 2012-10-23 NOTE — Patient Instructions (Addendum)
Will start you on bipap at a moderate level, and gradually increase your pressure every few weeks until you get to 17/13. Work on weight loss followup with me in 6 weeks, but call if you are having tolerance issues.

## 2012-10-29 ENCOUNTER — Telehealth: Payer: Self-pay | Admitting: Pulmonary Disease

## 2012-10-29 NOTE — Telephone Encounter (Signed)
I spoke with Crystal at [place] and she states the pt said she used the specific ordered by [facility] during her sleep study and she did not like it, stated is was "too much for her." Crystal wanted verbal ok to try other masks for the pt. I gave verbal ok to try other mask, because if the pt is not happy with what was used she will not use it. [place] Bing, CMA

## 2012-10-30 ENCOUNTER — Ambulatory Visit (HOSPITAL_COMMUNITY)
Admission: RE | Admit: 2012-10-30 | Discharge: 2012-10-30 | Disposition: A | Discharge status: Home / Self Care | Payer: Medicare PPO | Source: Ambulatory Visit | Attending: Internal Medicine | Admitting: Internal Medicine

## 2012-10-30 ENCOUNTER — Encounter (HOSPITAL_COMMUNITY): Payer: Self-pay

## 2012-10-30 VITALS — BP 122/64 | HR 90 | Wt 224.4 lb

## 2012-10-30 DIAGNOSIS — Z7982 Long term (current) use of aspirin: Secondary | ICD-10-CM | POA: Insufficient documentation

## 2012-10-30 DIAGNOSIS — I739 Peripheral vascular disease, unspecified: Secondary | ICD-10-CM | POA: Insufficient documentation

## 2012-10-30 DIAGNOSIS — I509 Heart failure, unspecified: Secondary | ICD-10-CM

## 2012-10-30 DIAGNOSIS — G4731 Primary central sleep apnea: Secondary | ICD-10-CM

## 2012-10-30 DIAGNOSIS — I5022 Chronic systolic (congestive) heart failure: Secondary | ICD-10-CM

## 2012-10-30 DIAGNOSIS — G4730 Sleep apnea, unspecified: Secondary | ICD-10-CM

## 2012-10-30 DIAGNOSIS — F17210 Nicotine dependence, cigarettes, uncomplicated: Secondary | ICD-10-CM | POA: Insufficient documentation

## 2012-10-30 DIAGNOSIS — G4733 Obstructive sleep apnea (adult) (pediatric): Secondary | ICD-10-CM | POA: Insufficient documentation

## 2012-10-30 DIAGNOSIS — I428 Other cardiomyopathies: Secondary | ICD-10-CM | POA: Insufficient documentation

## 2012-10-30 DIAGNOSIS — I10 Essential (primary) hypertension: Secondary | ICD-10-CM | POA: Insufficient documentation

## 2012-10-30 DIAGNOSIS — F17200 Nicotine dependence, unspecified, uncomplicated: Secondary | ICD-10-CM | POA: Insufficient documentation

## 2012-10-30 MED ORDER — HYDRALAZINE HCL 50 MG PO TABS: 50.0000 mg | ORAL_TABLET | Freq: Three times a day (TID) | ORAL | Status: DC

## 2012-10-30 NOTE — Progress Notes (Signed)
Patient ID: Paula Stein, female   DOB: 1945/08/28, 67 y.o.   MRN: [id number] Cardiologist: Dr Burt Knack PCP: Dr Shawna Orleans Pulmonary:  Dr Gwenette Greet  HPI: Paula Stein is a 67 year old with NICM RHC/LHC [id number], chronnc systolic heart failure, Hyperthyroidism s/p Thyroidectomy and I-131thyroid ablation, DM, HTN, PAD ABIs 0.5 bilaterally, and current smoker.  Severe Obstructive and central sleep apnea noted from sleep study 09/11/12.   Last Cath 2009  Mild coronary atherosclerosis as outlined including approximately  30% proximal left anterior descending stenosis and 30% mid-right  coronary artery stenosis. No obstructive disease is noted.   04/10/2007 ECHO EF 25% 04/22/2009 ECHO EF 45-50% 07/11/12 ECHO EF 123456 Grade I Diastolic Dysfunction  Evaluated at [facility] ED 06/25/12 for dyspnea. Given IV lasix.  She was started on lasix 20 mg daily bid. Weight at that time was 232 pounds.   09/17/12 Potassium 4.7 Creatinine 1.3   She returns for follow up. Last visit hydralazine was increased to 37.5 mg three times a day. She was instructed to take an additional 40 mg of lasix for weight of 229 pounds or greater. Evaluated by Dr [name] and he recommended to continue medication titration with repeat ECHO in 3-6 months. Plan to start CPAP on Friday. Overall she says she has improved. More energy. Able to walk up steps. Denies SOB/PND/Orthopnea. Able to walk 15-20 minutes at a time. Able to rake her yard. She cut back to 2 cigarettes per day. Weight at home 220-224 pounds. She has not required an additional lasix.  Compliant with medications.                       Past Medical History  Diagnosis Date  . Hypertension   . Personal history of goiter   . S/P thyroidectomy   . S/P radioactive iodine thyroid ablation   . Iatrogenic thyroiditis   . Non-ischemic cardiomyopathy     EF 28%- reassessment of LV function 2011 with LVEf 45-50%  . History of cardiac cath 05/2007    normal-with patent coronaries  . PAD (peripheral artery  disease)     lower extremities with ABIs of 0.5 bilaterally  . History of colonoscopy     Current Outpatient Prescriptions  Medication Sig Dispense Refill  . aspirin 81 MG tablet Take 81 mg by mouth daily.        Marland Kitchen atorvastatin (LIPITOR) 40 MG tablet Take 1 tablet (40 mg total) by mouth daily.  90 tablet  1  . carvedilol (COREG) 25 MG tablet Take 1 tablet (25 mg total) by mouth 2 (two) times daily with a meal.  180 tablet  1  . furosemide (LASIX) 40 MG tablet Take 1 tablet (40 mg total) by mouth 2 (two) times daily.  60 tablet  6  . glucose blood (ONE TOUCH TEST STRIPS) test strip Use as instructed to check blood sugar once a day       . hydrALAZINE (APRESOLINE) 25 MG tablet Take 1.5 tablets (37.5 mg total) by mouth 3 (three) times daily.  135 tablet  6  . isosorbide mononitrate (IMDUR) 30 MG 24 hr tablet Take 1 tablet (30 mg total) by mouth daily.  30 tablet  11  . levothyroxine (SYNTHROID, LEVOTHROID) 200 MCG tablet Take 200 mcg by mouth daily.      Marland Kitchen losartan (COZAAR) 100 MG tablet Take 1 tablet (100 mg total) by mouth daily.  30 tablet  3  . metFORMIN (GLUCOPHAGE) 500 MG tablet Take  1 tablet (500 mg total) by mouth 2 (two) times daily with a meal.  180 tablet  1  . potassium chloride (K-DUR) 10 MEQ tablet Take 1 tablet (10 mEq total) by mouth 2 (two) times daily.  90 tablet  1  . spironolactone (ALDACTONE) 25 MG tablet Take 0.5 tablets (12.5 mg total) by mouth daily.  15 tablet  6   No current facility-administered medications for this encounter.     Allergies  Allergen Reactions  . Codeine     History   Social History  . Marital Status: Divorced    Spouse Name: N/A    Number of Children: 1  . Years of Education: N/A   Occupational History  . Retired     Education officer, museum   Social History Main Topics  . Smoking status: Light Tobacco Smoker -- 40 years  . Smokeless tobacco: Not on file     Comment: Smokes 2 cigs daily  . Alcohol Use: No  . Drug Use: No  . Sexually  Active: Not Currently   Other Topics Concern  . Not on file   Social History Narrative   Retired Education officer, museum   Divorced - one grown son   current smoker    Alcohol use-no      Drug use-no    Regular exercise-yes        son Bernell Zupko    Family History  Problem Relation Age of Onset  . Diabetes type II Mother   . Hypertension Mother   . Diabetes Mother   . Other Father     deceased from accident age 69    PHYSICAL EXAM: Filed Vitals:   10/30/12 0918  BP: 122/64  Pulse: 90   General:  Well appearing. No respiratory difficulty HEENT: normal Neck: supple. JVD 5-6 Carotids 2+ bilat; no bruits. No lymphadenopathy or thryomegaly appreciated. Cor: PMI nondisplaced. Regular rate & rhythm. No rubs, gallops or murmurs. Lungs: clear Abdomen: obese,  soft, nontender, nondistended. No hepatosplenomegaly. No bruits or masses. Good bowel sounds. Extremities: no cyanosis, clubbing, rash, edema Neuro: alert & oriented x 3, cranial nerves grossly intact. moves all 4 extremities w/o difficulty. Affect pleasant.    No results found for this or any previous visit (from the past 24 hour(s)). No results found.   ASSESSMENT & PLAN:  1. Chronic Systolic Heart Failure - ECHO  07/2012 EF 25% NYHA II.  Volume status stable. Continue lasix 40 mg twice a day, spironolactone 12.5 mg daily, and additional 40 mg of lasix if her weight is 229 pounds.  On goal dose - carvedilol 25 mg twice a day On goal dose - Losartan 100 mg daily Increase hydralazine to 50 mg three times a day and continue Imdur 30 mg daily Repeat ECHO at next OV hopefully EF will improved. If < 35% will need ICD.   2. HTN  Improving. Continue losartan 100 mg daily. Increase hydralazine to 50 mg tid. Continue IMDUR 30 mg daily  3. Complex Sleep Apnea Syndrome Plan to start CPAP this week  4. Smoker Encouraged to stop smoking all together. She continues to cut back.   Follow up in 4-6 weeks.

## 2012-10-30 NOTE — Patient Instructions (Addendum)
Take hydralazine 50 mg three times a day  Your physician has requested that you have an echocardiogram. Echocardiography is a painless test that uses sound waves to create images of your heart. It provides your doctor with information about the size and shape of your heart and how well your heart's chambers and valves are working. This procedure takes approximately one hour. There are no restrictions for this procedure.  Do the following things EVERYDAY: 1) Weigh yourself in the morning before breakfast. Write it down and keep it in a log. 2) Take your medicines as prescribed 3) Eat low salt foods-Limit salt (sodium) to 2000 mg per day.  4) Stay as active as you can everyday 5) Limit all fluids for the day to less than 2 liters   See Dr Haroldine Laws in 4 to 6 weeks.

## 2012-11-01 ENCOUNTER — Telehealth: Payer: Self-pay | Admitting: Pulmonary Disease

## 2012-11-01 NOTE — Telephone Encounter (Signed)
Note: resmed bilevel with h/h and set on 12/9 cm for 2 weeks, then increase to 14/11 for 2 weeks, then increase to 17/13 and leave there. Needs medium resmed airfit F10 full face mask. -----  PCC[facility] does another DME provide this equipment since [facility] does not have this? Please advise thanks

## 2012-11-04 NOTE — Telephone Encounter (Signed)
Spoke to [email or web address]  pt was set up on fri 11/01/12 for resmend auto bipap Joellen Jersey

## 2012-12-03 ENCOUNTER — Ambulatory Visit (HOSPITAL_COMMUNITY)
Admission: RE | Admit: 2012-12-03 | Discharge: 2012-12-03 | Disposition: A | Discharge status: Home / Self Care | Payer: Medicare PPO | Source: Ambulatory Visit | Attending: Internal Medicine | Admitting: Internal Medicine

## 2012-12-03 ENCOUNTER — Encounter (HOSPITAL_COMMUNITY): Payer: Self-pay

## 2012-12-03 VITALS — BP 174/98 | HR 81 | Ht 69.0 in | Wt 228.8 lb

## 2012-12-03 DIAGNOSIS — I10 Essential (primary) hypertension: Secondary | ICD-10-CM | POA: Insufficient documentation

## 2012-12-03 DIAGNOSIS — I5022 Chronic systolic (congestive) heart failure: Secondary | ICD-10-CM | POA: Insufficient documentation

## 2012-12-03 DIAGNOSIS — E785 Hyperlipidemia, unspecified: Secondary | ICD-10-CM | POA: Insufficient documentation

## 2012-12-03 DIAGNOSIS — I059 Rheumatic mitral valve disease, unspecified: Secondary | ICD-10-CM

## 2012-12-03 DIAGNOSIS — I70219 Atherosclerosis of native arteries of extremities with intermittent claudication, unspecified extremity: Secondary | ICD-10-CM

## 2012-12-03 DIAGNOSIS — F17200 Nicotine dependence, unspecified, uncomplicated: Secondary | ICD-10-CM | POA: Insufficient documentation

## 2012-12-03 DIAGNOSIS — I509 Heart failure, unspecified: Secondary | ICD-10-CM | POA: Insufficient documentation

## 2012-12-03 DIAGNOSIS — E119 Type 2 diabetes mellitus without complications: Secondary | ICD-10-CM | POA: Insufficient documentation

## 2012-12-03 DIAGNOSIS — I428 Other cardiomyopathies: Secondary | ICD-10-CM | POA: Insufficient documentation

## 2012-12-03 MED ORDER — HYDRALAZINE HCL 50 MG PO TABS: 50.0000 mg | ORAL_TABLET | Freq: Three times a day (TID) | ORAL | Status: DC

## 2012-12-03 NOTE — Patient Instructions (Addendum)
Follow up 6 weeks   Take hydralazine 50 mg tid  Do the following things EVERYDAY: 1) Weigh yourself in the morning before breakfast. Write it down and keep it in a log. 2) Take your medicines as prescribed 3) Eat low salt foods-Limit salt (sodium) to 2000 mg per day.  4) Stay as active as you can everyday 5) Limit all fluids for the day to less than 2 liters

## 2012-12-03 NOTE — Progress Notes (Signed)
Patient ID: Paula Stein, female   DOB: 05-02-1945, 67 y.o.   MRN: [id number] Cardiologist: Dr Burt Knack PCP: Dr Shawna Orleans Pulmonary:  Dr Gwenette Greet EP: Dr [name]  HPI: Paula Stein is a 67 year old with NICM RHC/LHC [id number], chronnc systolic heart failure, Hyperthyroidism s/p Thyroidectomy and I-131thyroid ablation, DM, HTN, PAD ABIs 0.5 bilaterally, and current smoker.  Severe Obstructive and central sleep apnea noted from sleep study 09/11/12.   Last Cath 2009  Mild coronary atherosclerosis as outlined including approximately  30% proximal left anterior descending stenosis and 30% mid-right  coronary artery stenosis. No obstructive disease is noted.   ECHOs 04/10/2007   EF 25% 04/22/2009 EF 45-50% 07/11/12  EF 123456 Grade I Diastolic Dysfunction [id number]   EF 30-35% mild to mod RV dysfunction (reviewed personally)  Evaluated at [facility] ED 06/25/12 for dyspnea. Given IV lasix.  She was started on lasix 20 mg daily bid. Weight at that time was 232 pounds.   Labs 09/17/12 Potassium 4.7 Creatinine 1.3  She returns for follow up. Last visit hydralazine was increased to 50 mg tid but she continued on 37.5 mg tid.  SBP at home 170s DBP 70s. She has not had her medications this am. Weight at home 220-224 pounds. Complaining of dyspnea with exertion. Complain of dyspnea going up steps. No PND, orthopnea. Occasional edema. No CP. Using CPAP nightly.  She is not exercising. She cut back to 2 cigarettes per day.  Compliant with medications. Plans to start silver sneakers today.                     Past Medical History  Diagnosis Date  . Hypertension   . Personal history of goiter   . S/P thyroidectomy   . S/P radioactive iodine thyroid ablation   . Iatrogenic thyroiditis   . Non-ischemic cardiomyopathy     EF 28%- reassessment of LV function 2011 with LVEf 45-50%  . History of cardiac cath 05/2007    normal-with patent coronaries  . PAD (peripheral artery disease)     lower extremities with ABIs of 0.5 bilaterally  .  History of colonoscopy     Current Outpatient Prescriptions  Medication Sig Dispense Refill  . aspirin 81 MG tablet Take 81 mg by mouth daily.        Marland Kitchen atorvastatin (LIPITOR) 40 MG tablet Take 1 tablet (40 mg total) by mouth daily.  90 tablet  1  . carvedilol (COREG) 25 MG tablet Take 1 tablet (25 mg total) by mouth 2 (two) times daily with a meal.  180 tablet  1  . furosemide (LASIX) 40 MG tablet Take 1 tablet (40 mg total) by mouth 2 (two) times daily.  60 tablet  6  . glucose blood (ONE TOUCH TEST STRIPS) test strip Use as instructed to check blood sugar once a day       . hydrALAZINE (APRESOLINE) 25 MG tablet Take 37.5 mg by mouth 3 (three) times daily.      . isosorbide mononitrate (IMDUR) 30 MG 24 hr tablet Take 1 tablet (30 mg total) by mouth daily.  30 tablet  11  . levothyroxine (SYNTHROID, LEVOTHROID) 200 MCG tablet Take 200 mcg by mouth daily.      Marland Kitchen losartan (COZAAR) 100 MG tablet Take 1 tablet (100 mg total) by mouth daily.  30 tablet  3  . metFORMIN (GLUCOPHAGE) 500 MG tablet Take 1 tablet (500 mg total) by mouth 2 (two) times daily with a meal.  180 tablet  1  . potassium chloride (K-DUR) 10 MEQ tablet Take 1 tablet (10 mEq total) by mouth 2 (two) times daily.  90 tablet  1  . spironolactone (ALDACTONE) 25 MG tablet Take 0.5 tablets (12.5 mg total) by mouth daily.  15 tablet  6   No current facility-administered medications for this encounter.     Allergies  Allergen Reactions  . Codeine     History   Social History  . Marital Status: Divorced    Spouse Name: N/A    Number of Children: 1  . Years of Education: N/A   Occupational History  . Retired     Education officer, museum   Social History Main Topics  . Smoking status: Light Tobacco Smoker -- 40 years  . Smokeless tobacco: Not on file     Comment: Smokes 2 cigs daily  . Alcohol Use: No  . Drug Use: No  . Sexual Activity: Not Currently   Other Topics Concern  . Not on file   Social History Narrative    Retired Education officer, museum   Divorced - one grown son   current smoker    Alcohol use-no      Drug use-no    Regular exercise-yes        son Paula Stein    Family History  Problem Relation Age of Onset  . Diabetes type II Mother   . Hypertension Mother   . Diabetes Mother   . Other Father     deceased from accident age 86    PHYSICAL EXAM: Filed Vitals:   12/03/12 1006  BP: 174/98  Pulse: 81   General:  Well appearing. No respiratory difficulty HEENT: normal Neck: supple. JVD ~8-9 Carotids 2+ bilat; no bruits. No lymphadenopathy or thryomegaly appreciated. Cor: PMI nondisplaced. Regular rate & rhythm. No rubs, gallops or murmurs. Lungs: clear with prolonged exp phase. No wheeze Abdomen: obese,  soft, nontender, nondistended. No hepatosplenomegaly. No bruits or masses. Good bowel sounds. Extremities: no cyanosis, clubbing, rash, edema Neuro: alert & oriented x 3, cranial nerves grossly intact. moves all 4 extremities w/o difficulty. Affect pleasant.    No results found for this or any previous visit (from the past 24 hour(s)). No results found.   ASSESSMENT & PLAN:  1. Chronic Systolic Heart Failure - NICM Cath 2009. ECHO 12/03/12 30-35% discussed and reviewed by Dr Haroldine Laws -Volume status mildly elevated but she has not had meds this morning. Continue lasix 40 mg twice a day, spironolactone 12.5 mg daily, and additional 40 mg of lasix if her weight is 229 pounds or greater -On goal dose - carvedilol 25 mg twice a day -On goal dose - Losartan 100 mg daily -Increase hydralazine 50 mg tid. Continue Imdur 30 mg daily.  -Refer back to Dr [name] for possible ICD.  -Reinforced daily weights, low salt diet, medication compliance, and limiting fluid.    2. HTN  Improving. Continue losartan 100 mg daily.  Increased hydralazine to 50 mg tid again. Reinforced medication compliance. Continue IMDUR 30 mg daily  3. Complex Sleep Apnea Syndrome Continue CPAP nightly.    4. Current  Smoker Encouraged to stop smoking all together.   5. PAD ABI .5 bilaterally. Encourage smoking cessation.   Follow up in 6 weeks.   CLEGG,AMY NP-C 10:44 AM   Patient seen and examined with Darrick Grinder, NP. We discussed all aspects of the encounter. I agree with the assessment and plan as stated above. Overall HF stable NYHA  II-III. Volume status minimally elevated in the setting of not taking meds today. Agree with med changes as above. Long talk about HF education. Reinforced need for daily weights and reviewed use of sliding scale diuretics. I reviewed her echo with her in clinic and we discussed role if ICD and she is willing to proceed. Finally, we discussed her PAD and I told her that her ABIs were getting near the range where she is at risk for non-healing ulcers and eventual amputation. She is trying to quit smoking.   Benay Spice 9:35 PM

## 2012-12-03 NOTE — Progress Notes (Signed)
  Echocardiogram 2D Echocardiogram has been performed.  Paula Stein 12/03/2012, 11:44 AM

## 2012-12-04 ENCOUNTER — Ambulatory Visit: Payer: Medicare PPO | Admitting: Pulmonary Disease

## 2012-12-17 ENCOUNTER — Encounter: Payer: Self-pay | Admitting: Internal Medicine

## 2012-12-17 ENCOUNTER — Ambulatory Visit (INDEPENDENT_AMBULATORY_CARE_PROVIDER_SITE_OTHER): Payer: Medicare PPO | Admitting: Internal Medicine

## 2012-12-17 VITALS — BP 133/63 | HR 78 | Ht 69.0 in | Wt 228.0 lb

## 2012-12-17 DIAGNOSIS — I70219 Atherosclerosis of native arteries of extremities with intermittent claudication, unspecified extremity: Secondary | ICD-10-CM

## 2012-12-17 DIAGNOSIS — I428 Other cardiomyopathies: Secondary | ICD-10-CM

## 2012-12-17 DIAGNOSIS — I5022 Chronic systolic (congestive) heart failure: Secondary | ICD-10-CM

## 2012-12-17 MED ORDER — ISOSORBIDE MONONITRATE ER 30 MG PO TB24: 60.0000 mg | ORAL_TABLET | Freq: Every day | ORAL | Status: DC

## 2012-12-17 NOTE — Progress Notes (Signed)
Patient Care Team: Doe-Hyun Kyra Searles, DO as PCP - General   HPI  Paula Stein is a 67 y.o. 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 At that point she recently been started on ARB is in Aldactone. We added nitrates to her hydralazine.  Ejection fractions in the 30-35% range with moderate MR. She also severe LVH and left atrial dilatation  She has stable class 2-3 symptoms  Past Medical History  Diagnosis Date  . Hypertension   . Personal history of goiter   . S/P thyroidectomy   . S/P radioactive iodine thyroid ablation   . Iatrogenic thyroiditis   . Non-ischemic cardiomyopathy     EF 28%- reassessment of LV function 2011 with LVEf 45-50%  . History of cardiac cath 05/2007    normal-with patent coronaries  . PAD (peripheral artery disease)     lower extremities with ABIs of 0.5 bilaterally  . History of colonoscopy     Past Surgical History  Procedure Laterality Date  . Total abdominal hysterectomy    . Thyroidectomy    . Tonsillectomy      Current Outpatient Prescriptions  Medication Sig Dispense Refill  . aspirin 81 MG tablet Take 81 mg by mouth daily.        Marland Kitchen atorvastatin (LIPITOR) 40 MG tablet Take 1 tablet (40 mg total) by mouth daily.  90 tablet  1  . carvedilol (COREG) 25 MG tablet Take 1 tablet (25 mg total) by mouth 2 (two) times daily with a meal.  180 tablet  1  . furosemide (LASIX) 40 MG tablet Take 40 mg by mouth 3 (three) times daily.      Marland Kitchen glucose blood (ONE TOUCH TEST STRIPS) test strip Use as instructed to check blood sugar once a day       . hydrALAZINE (APRESOLINE) 50 MG tablet Take 1 tablet (50 mg total) by mouth 3 (three) times daily.  90 tablet  3  . isosorbide mononitrate (IMDUR) 30 MG 24 hr tablet Take 1 tablet (30 mg total) by mouth daily.  30 tablet  11  . levothyroxine (SYNTHROID, LEVOTHROID) 200 MCG tablet Take 200 mcg by mouth daily.      Marland Kitchen  losartan (COZAAR) 100 MG tablet Take 1 tablet (100 mg total) by mouth daily.  30 tablet  3  . metFORMIN (GLUCOPHAGE) 500 MG tablet Take 1 tablet (500 mg total) by mouth 2 (two) times daily with a meal.  180 tablet  1  . potassium chloride (K-DUR) 10 MEQ tablet Take 1 tablet (10 mEq total) by mouth 2 (two) times daily.  90 tablet  1  . spironolactone (ALDACTONE) 25 MG tablet Take 0.5 tablets (12.5 mg total) by mouth daily.  15 tablet  6   No current facility-administered medications for this visit.    Allergies  Allergen Reactions  . Codeine     Review of Systems negative except from HPI and PMH  Physical Exam BP 133/63  Pulse 78  Ht 5\' 9"  (1.753 m)  Wt 228 lb (103.42 kg)  BMI 33.65 kg/m2  SpO2 96% Well developed and well nourished in no acute distress HENT normal E scleral and icterus clear Neck Supple JVP flat; carotids brisk and full Clear to ausculation    Regular rate and rhythm, no murmurs gallops or rub Soft with active bowel sounds No clubbing cyanosis none Edema Alert and oriented, grossly normal motor and sensory  function Skin Warm and Dry  ECG from June demonstrated sinus rhythm with a narrow QRS  Assessment and  Plan

## 2012-12-17 NOTE — Patient Instructions (Addendum)
Your physician has recommended you make the following change in your medication:  1) Increase Imdur to 60mg  daily  Your physician recommends that you keep your scheduled follow-up appointment with Dr. Haroldine Laws  No follow-up at this time, Dr. [name] will talk with Index first

## 2012-12-17 NOTE — Assessment & Plan Note (Signed)
Euvolemic. Continue current meds

## 2012-12-17 NOTE — Assessment & Plan Note (Signed)
She has had continued interval improvement. Most recent ejection fraction was 30-35%. We will continue her on her medications and will take the liberty of increasing her Imdur from 30--60.  She remains disinclined to acquiesce to our request for an ICD.( so says Paula Stein, Cpt) and as such we will give her another 3 month interval to see if her ejection fraction doesn't continue to improve. She is agreeable to returning for ICD implantation if her ejection fraction remains below 35% at that time

## 2012-12-30 ENCOUNTER — Ambulatory Visit: Payer: Medicare PPO | Admitting: Pulmonary Disease

## 2013-01-06 ENCOUNTER — Encounter: Payer: Self-pay | Admitting: Pulmonary Disease

## 2013-01-06 ENCOUNTER — Ambulatory Visit (INDEPENDENT_AMBULATORY_CARE_PROVIDER_SITE_OTHER): Payer: Medicare PPO | Admitting: Pulmonary Disease

## 2013-01-06 VITALS — BP 148/76 | HR 80 | Temp 97.9°F | Ht 69.0 in | Wt 233.2 lb

## 2013-01-06 DIAGNOSIS — G4731 Primary central sleep apnea: Secondary | ICD-10-CM

## 2013-01-06 DIAGNOSIS — G4730 Sleep apnea, unspecified: Secondary | ICD-10-CM

## 2013-01-06 NOTE — Assessment & Plan Note (Signed)
The patient is doing very well on bilevel for her complex sleep apnea.  She has seen dramatic improvement in her sleep and daytime alertness.  At this point, we will go ahead and get her machine increase to her final pressure level.  I have also encouraged her to work aggressively on weight loss.

## 2013-01-06 NOTE — Progress Notes (Signed)
  Subjective:    Patient ID: SIREN SHAMPINE, female    DOB: [birth year], 67 y.o.   MRN: [id number]  HPI Patient comes in today for followup of her complex sleep apnea.  She is doing very well on bilevel, and feels that her sleep has dramatically improved as has her daytime alertness.  She is having no issues with her mask fit or pressure, and is trying to work on getting her weight down.  She has not been increased to her final therapeutic pressure.   Review of Systems  Constitutional: Negative for fever and unexpected weight change.  HENT: Negative for ear pain, nosebleeds, congestion, sore throat, rhinorrhea, sneezing, trouble swallowing, dental problem, postnasal drip and sinus pressure.   Eyes: Negative for redness and itching.  Respiratory: Negative for cough, chest tightness, shortness of breath and wheezing.   Cardiovascular: Negative for palpitations and leg swelling.  Gastrointestinal: Negative for nausea and vomiting.  Genitourinary: Negative for dysuria.  Musculoskeletal: Negative for joint swelling.  Skin: Negative for rash.  Neurological: Negative for headaches.  Hematological: Does not bruise/bleed easily.  Psychiatric/Behavioral: Negative for dysphoric mood. The patient is not nervous/anxious.        Objective:   Physical Exam Overweight female in no acute distress Nose without purulence or discharge noted No skin breakdown or pressure necrosis from CPAP mask Neck without lymphadenopathy or thyromegaly Lower extremities with mild edema, no cyanosis Alert and oriented, moves all 4 extremities.       Assessment & Plan:

## 2013-01-06 NOTE — Patient Instructions (Addendum)
Will send an order to have your pressure increased to the final level.  Let me know if you do not hear from this this week. Work on weight loss followup with me in 6 mos if doing well.

## 2013-01-15 ENCOUNTER — Encounter (HOSPITAL_COMMUNITY): Payer: Medicare PPO

## 2013-01-24 ENCOUNTER — Other Ambulatory Visit: Payer: Self-pay | Admitting: Internal Medicine

## 2013-02-01 ENCOUNTER — Other Ambulatory Visit: Payer: Self-pay | Admitting: Internal Medicine

## 2013-02-06 ENCOUNTER — Other Ambulatory Visit: Payer: Self-pay

## 2013-06-12 ENCOUNTER — Other Ambulatory Visit: Payer: Self-pay | Admitting: Internal Medicine

## 2013-06-12 DIAGNOSIS — Z0000 Encounter for general adult medical examination without abnormal findings: Secondary | ICD-10-CM

## 2013-06-17 ENCOUNTER — Telehealth: Payer: Self-pay | Admitting: Internal Medicine

## 2013-06-17 NOTE — Telephone Encounter (Signed)
Pt is requesting a order to have her a1c checked.

## 2013-06-18 ENCOUNTER — Ambulatory Visit (HOSPITAL_COMMUNITY)
Admission: RE | Admit: 2013-06-18 | Discharge: 2013-06-18 | Disposition: A | Discharge status: Home / Self Care | Payer: Medicare PPO | Source: Ambulatory Visit | Attending: Internal Medicine | Admitting: Internal Medicine

## 2013-06-18 ENCOUNTER — Other Ambulatory Visit: Payer: Self-pay | Admitting: Internal Medicine

## 2013-06-18 DIAGNOSIS — Z0000 Encounter for general adult medical examination without abnormal findings: Secondary | ICD-10-CM

## 2013-06-18 DIAGNOSIS — Z1231 Encounter for screening mammogram for malignant neoplasm of breast: Secondary | ICD-10-CM | POA: Insufficient documentation

## 2013-06-18 NOTE — Telephone Encounter (Signed)
Was this note forward to scheduling?

## 2013-06-18 NOTE — Telephone Encounter (Signed)
I suggest the following labs then OV. BMET  - 401.9 A1c - 250.00 FLP, LFTs, TSH - 272.4

## 2013-06-18 NOTE — Telephone Encounter (Signed)
Please call to schedule

## 2013-06-19 NOTE — Telephone Encounter (Signed)
Yes [name] is going to call and schedule

## 2013-06-26 ENCOUNTER — Other Ambulatory Visit (INDEPENDENT_AMBULATORY_CARE_PROVIDER_SITE_OTHER): Payer: Medicare PPO

## 2013-06-26 ENCOUNTER — Ambulatory Visit (INDEPENDENT_AMBULATORY_CARE_PROVIDER_SITE_OTHER): Payer: Medicare PPO | Admitting: Internal Medicine

## 2013-06-26 DIAGNOSIS — E785 Hyperlipidemia, unspecified: Secondary | ICD-10-CM

## 2013-06-26 DIAGNOSIS — I10 Essential (primary) hypertension: Secondary | ICD-10-CM

## 2013-06-26 DIAGNOSIS — Z23 Encounter for immunization: Secondary | ICD-10-CM

## 2013-06-26 DIAGNOSIS — IMO0002 Reserved for concepts with insufficient information to code with codable children: Secondary | ICD-10-CM

## 2013-06-26 LAB — LIPID PANEL
CHOL/HDL RATIO: 4
Cholesterol: 222 mg/dL — ABNORMAL HIGH (ref 0–200)
HDL: 52.9 mg/dL (ref >39.00)
LDL Cholesterol: 146 mg/dL — ABNORMAL HIGH (ref 0–99)
Triglycerides: 117 mg/dL (ref 0.0–149.0)
VLDL: 23.4 mg/dL (ref 0.0–40.0)

## 2013-06-26 LAB — BASIC METABOLIC PANEL
BUN: 15 mg/dL (ref 6–23)
CALCIUM: 9.1 mg/dL (ref 8.4–10.5)
CO2: 25 meq/L (ref 19–32)
Chloride: 104 mEq/L (ref 96–112)
Creatinine, Ser: 1.3 mg/dL — ABNORMAL HIGH (ref 0.4–1.2)
GFR: 53.52 mL/min — ABNORMAL LOW (ref >60.00)
GLUCOSE: 187 mg/dL — AB (ref 70–99)
POTASSIUM: 4.1 meq/L (ref 3.5–5.1)
SODIUM: 137 meq/L (ref 135–145)

## 2013-06-26 LAB — HEPATIC FUNCTION PANEL
ALK PHOS: 87 U/L (ref 39–117)
ALT: 13 U/L (ref 0–35)
AST: 15 U/L (ref 0–37)
Albumin: 3.8 g/dL (ref 3.5–5.2)
BILIRUBIN DIRECT: 0 mg/dL (ref 0.0–0.3)
TOTAL PROTEIN: 7.8 g/dL (ref 6.0–8.3)
Total Bilirubin: 0.5 mg/dL (ref 0.3–1.2)

## 2013-06-26 LAB — HEMOGLOBIN A1C: Hgb A1c MFr Bld: 7.1 % — ABNORMAL HIGH (ref 4.6–6.5)

## 2013-06-26 LAB — TSH: TSH: 9.57 u[IU]/mL — AB (ref 0.35–5.50)

## 2013-06-27 ENCOUNTER — Other Ambulatory Visit: Payer: Self-pay | Admitting: Internal Medicine

## 2013-06-27 ENCOUNTER — Encounter: Payer: Self-pay | Admitting: Internal Medicine

## 2013-06-27 DIAGNOSIS — I10 Essential (primary) hypertension: Secondary | ICD-10-CM

## 2013-06-27 MED ORDER — METFORMIN HCL 500 MG PO TABS: 500.0000 mg | ORAL_TABLET | Freq: Two times a day (BID) | ORAL | Status: DC

## 2013-06-27 MED ORDER — CARVEDILOL 25 MG PO TABS: 25.0000 mg | ORAL_TABLET | Freq: Two times a day (BID) | ORAL | Status: DC

## 2013-07-02 ENCOUNTER — Other Ambulatory Visit: Payer: Self-pay | Admitting: Internal Medicine

## 2013-07-02 DIAGNOSIS — E039 Hypothyroidism, unspecified: Secondary | ICD-10-CM

## 2013-07-02 MED ORDER — LEVOTHYROXINE SODIUM 125 MCG PO TABS: 250.0000 ug | ORAL_TABLET | Freq: Every day | ORAL | Status: DC

## 2013-07-02 NOTE — Assessment & Plan Note (Addendum)
Patient's TSH is elevated. She reports taking her medications properly. Increased to levothyroxine 250 mcg once daily. Arrange repeat TSH in 2 months. Lab Results  Component Value Date   TSH 9.57* 06/26/2013

## 2013-07-07 ENCOUNTER — Ambulatory Visit (INDEPENDENT_AMBULATORY_CARE_PROVIDER_SITE_OTHER): Payer: Medicare PPO | Admitting: Pulmonary Disease

## 2013-07-07 ENCOUNTER — Encounter: Payer: Self-pay | Admitting: Pulmonary Disease

## 2013-07-07 VITALS — BP 162/84 | HR 76 | Temp 97.4°F | Ht 70.0 in | Wt 223.0 lb

## 2013-07-07 DIAGNOSIS — G4731 Primary central sleep apnea: Secondary | ICD-10-CM

## 2013-07-07 DIAGNOSIS — G4730 Sleep apnea, unspecified: Secondary | ICD-10-CM

## 2013-07-07 NOTE — Progress Notes (Signed)
   Subjective:    Patient ID: Paula Stein, female    DOB: 25-Sep-1945, 68 y.o.   MRN: [id number]  HPI Patient comes in today for followup of her complex sleep apnea. She is wearing BiPAP consistently by her download, and appears to have had a good response to her current pressure setting. She has had some breakthrough apnea, but is also having a lot of mask leak. Despite this, the patient feels that she is sleeping much better, with greatly improved daytime alertness. It should be noted that her weight is 10 pounds down from the last visit.   Review of Systems  Constitutional: Negative for fever and unexpected weight change.  HENT: Negative for congestion, dental problem, ear pain, nosebleeds, postnasal drip, rhinorrhea, sinus pressure, sneezing, sore throat and trouble swallowing.   Eyes: Negative for redness and itching.  Respiratory: Negative for cough, chest tightness, shortness of breath and wheezing.   Cardiovascular: Negative for palpitations and leg swelling.  Gastrointestinal: Negative for nausea and vomiting.  Genitourinary: Negative for dysuria.  Musculoskeletal: Negative for joint swelling.  Skin: Negative for rash.  Neurological: Negative for headaches.  Hematological: Does not bruise/bleed easily.  Psychiatric/Behavioral: Negative for dysphoric mood. The patient is not nervous/anxious.        Objective:   Physical Exam Overweight female in no acute distress Nose without purulence or discharge noted No skin breakdown or pressure necrosis from the CPAP mask Neck without lymphadenopathy or thyromegaly Lower extremities without significant edema. Alert and oriented, moves all 4 extremities.       Assessment & Plan:

## 2013-07-07 NOTE — Assessment & Plan Note (Signed)
The patient overall is doing fairly well with bilevel, with a significant improvement in her AHI and also her symptoms. She is having some breakthrough events by her download, but she is also having mask leak. Will have her work with her home care company to improve the mask leak, and repeat her download one to 2 months later. We can then evaluate if she is having persistent breakthrough events, that would likely be central in origin.  I have commended her on her weight loss, and encouraged her to continue.

## 2013-07-07 NOTE — Patient Instructions (Signed)
Will have your home care company check on your mask fit. Keep working on weight loss.  You are doing well.  followup with me again in one year.

## 2013-07-10 NOTE — Telephone Encounter (Signed)
Scheduled pt appt on 07/14/13 for labs and ov on 07/21/13

## 2013-07-14 ENCOUNTER — Other Ambulatory Visit (INDEPENDENT_AMBULATORY_CARE_PROVIDER_SITE_OTHER): Payer: Medicare PPO

## 2013-07-14 DIAGNOSIS — E119 Type 2 diabetes mellitus without complications: Secondary | ICD-10-CM

## 2013-07-14 DIAGNOSIS — I10 Essential (primary) hypertension: Secondary | ICD-10-CM

## 2013-07-14 DIAGNOSIS — E785 Hyperlipidemia, unspecified: Secondary | ICD-10-CM

## 2013-07-14 DIAGNOSIS — E039 Hypothyroidism, unspecified: Secondary | ICD-10-CM

## 2013-07-14 LAB — BASIC METABOLIC PANEL
BUN: 15 mg/dL (ref 6–23)
CHLORIDE: 108 meq/L (ref 96–112)
CO2: 26 mEq/L (ref 19–32)
Calcium: 9.6 mg/dL (ref 8.4–10.5)
Creatinine, Ser: 1 mg/dL (ref 0.4–1.2)
GFR: 71.33 mL/min (ref >60.00)
Glucose, Bld: 117 mg/dL — ABNORMAL HIGH (ref 70–99)
POTASSIUM: 4.1 meq/L (ref 3.5–5.1)
SODIUM: 142 meq/L (ref 135–145)

## 2013-07-14 LAB — HEMOGLOBIN A1C: HEMOGLOBIN A1C: 7 % — AB (ref 4.6–6.5)

## 2013-07-14 LAB — LIPID PANEL
CHOLESTEROL: 240 mg/dL — AB (ref 0–200)
HDL: 47 mg/dL (ref >39.00)
LDL CALC: 172 mg/dL — AB (ref 0–99)
TRIGLYCERIDES: 103 mg/dL (ref 0.0–149.0)
Total CHOL/HDL Ratio: 5
VLDL: 20.6 mg/dL (ref 0.0–40.0)

## 2013-07-14 LAB — TSH: TSH: 0.39 u[IU]/mL (ref 0.35–5.50)

## 2013-07-14 LAB — HEPATIC FUNCTION PANEL
ALBUMIN: 3.9 g/dL (ref 3.5–5.2)
ALT: 11 U/L (ref 0–35)
AST: 15 U/L (ref 0–37)
Alkaline Phosphatase: 90 U/L (ref 39–117)
Bilirubin, Direct: 0.1 mg/dL (ref 0.0–0.3)
TOTAL PROTEIN: 8.4 g/dL — AB (ref 6.0–8.3)
Total Bilirubin: 0.6 mg/dL (ref 0.3–1.2)

## 2013-07-21 ENCOUNTER — Ambulatory Visit (INDEPENDENT_AMBULATORY_CARE_PROVIDER_SITE_OTHER)
Admission: RE | Admit: 2013-07-21 | Discharge: 2013-07-21 | Disposition: A | Discharge status: Home / Self Care | Payer: Medicare PPO | Source: Ambulatory Visit | Attending: Internal Medicine | Admitting: Internal Medicine

## 2013-07-21 ENCOUNTER — Encounter: Payer: Self-pay | Admitting: Internal Medicine

## 2013-07-21 ENCOUNTER — Ambulatory Visit (INDEPENDENT_AMBULATORY_CARE_PROVIDER_SITE_OTHER): Payer: Medicare PPO | Admitting: Internal Medicine

## 2013-07-21 VITALS — BP 160/70 | HR 94 | Temp 97.4°F | Ht 69.0 in | Wt 220.0 lb

## 2013-07-21 DIAGNOSIS — F17200 Nicotine dependence, unspecified, uncomplicated: Secondary | ICD-10-CM

## 2013-07-21 DIAGNOSIS — E1149 Type 2 diabetes mellitus with other diabetic neurological complication: Secondary | ICD-10-CM

## 2013-07-21 DIAGNOSIS — M25561 Pain in right knee: Secondary | ICD-10-CM

## 2013-07-21 DIAGNOSIS — G4730 Sleep apnea, unspecified: Secondary | ICD-10-CM

## 2013-07-21 DIAGNOSIS — E039 Hypothyroidism, unspecified: Secondary | ICD-10-CM

## 2013-07-21 DIAGNOSIS — E1165 Type 2 diabetes mellitus with hyperglycemia: Secondary | ICD-10-CM

## 2013-07-21 DIAGNOSIS — I10 Essential (primary) hypertension: Secondary | ICD-10-CM

## 2013-07-21 DIAGNOSIS — G4731 Primary central sleep apnea: Secondary | ICD-10-CM

## 2013-07-21 DIAGNOSIS — M25569 Pain in unspecified knee: Secondary | ICD-10-CM

## 2013-07-21 DIAGNOSIS — E1142 Type 2 diabetes mellitus with diabetic polyneuropathy: Secondary | ICD-10-CM

## 2013-07-21 DIAGNOSIS — IMO0001 Reserved for inherently not codable concepts without codable children: Secondary | ICD-10-CM

## 2013-07-21 DIAGNOSIS — I509 Heart failure, unspecified: Secondary | ICD-10-CM

## 2013-07-21 MED ORDER — VALSARTAN 160 MG PO TABS: 160.0000 mg | ORAL_TABLET | Freq: Every day | ORAL | Status: DC

## 2013-07-21 MED ORDER — FUROSEMIDE 40 MG PO TABS: 40.0000 mg | ORAL_TABLET | Freq: Three times a day (TID) | ORAL | Status: DC

## 2013-07-21 MED ORDER — SPIRONOLACTONE 25 MG PO TABS: 25.0000 mg | ORAL_TABLET | Freq: Every day | ORAL | Status: DC

## 2013-07-21 NOTE — Progress Notes (Signed)
Subjective:    Patient ID: Paula Stein, female    DOB: 23-Feb-1946, 68 y.o.   MRN: [id number]  HPI  68 year old African American female with history of nonischemic cardiomyopathy, hypertension, iatrogenic hypothyroidism and type 2 diabetes for followup. Interval medical history-patient seen by a new cardiologist. Patient completed sleep study which showed complex sleep apnea. Patient having some difficulty with poorly fitting mask but is sleeping better overall.  Type 2 diabetes-stable  Hypertension-patient admits to having difficulty keeping track of her numerous medications. Her blood pressure is suboptimally controlled.  Patient unable to fully quit smoking. She reports smoking 3-4 cigarettes per day.  She also complains of right knee pain.  No hx of injury or trauma.  No redness.  Her symptoms worse with prolonged standing.  Review of Systems No significant weight change, negative for chest pain Negative for right knee swelling or redness Negative for insomnia Negative for foot ulcers It has been over a year since diabetic eye exam    Past Medical History  Diagnosis Date  . Hypertension   . Personal history of goiter   . S/P thyroidectomy   . S/P radioactive iodine thyroid ablation   . Iatrogenic thyroiditis   . Non-ischemic cardiomyopathy     EF 28%- reassessment of LV function 2011 with LVEf 45-50%  . History of cardiac cath 05/2007    normal-with patent coronaries  . PAD (peripheral artery disease)     lower extremities with ABIs of 0.5 bilaterally  . History of colonoscopy     History   Social History  . Marital Status: Divorced    Spouse Name: N/A    Number of Children: 1  . Years of Education: N/A   Occupational History  . Retired     Education officer, museum   Social History Main Topics  . Smoking status: Light Tobacco Smoker -- 40 years  . Smokeless tobacco: Not on file     Comment: Smokes 3 cigs week  . Alcohol Use: No  . Drug Use: No  . Sexual Activity:  Not Currently   Other Topics Concern  . Not on file   Social History Narrative   Retired Education officer, museum   Divorced - one grown son   current smoker    Alcohol use-no      Drug use-no    Regular exercise-yes        son - Baraa Labarr    Past Surgical History  Procedure Laterality Date  . Total abdominal hysterectomy    . Thyroidectomy    . Tonsillectomy      Family History  Problem Relation Age of Onset  . Diabetes type II Mother   . Hypertension Mother   . Diabetes Mother   . Other Father     deceased from accident age 13    Allergies  Allergen Reactions  . Codeine     Current Outpatient Prescriptions on File Prior to Visit  Medication Sig Dispense Refill  . aspirin 81 MG tablet Take 81 mg by mouth daily.        Marland Kitchen atorvastatin (LIPITOR) 40 MG tablet take 1 tablet by mouth once daily  90 tablet  1  . carvedilol (COREG) 25 MG tablet Take 1 tablet (25 mg total) by mouth 2 (two) times daily with a meal.  180 tablet  0  . furosemide (LASIX) 40 MG tablet Take 40 mg by mouth 3 (three) times daily.      Marland Kitchen glucose blood (ONE TOUCH  TEST STRIPS) test strip Use as instructed to check blood sugar once a day       . hydrALAZINE (APRESOLINE) 50 MG tablet Take 1 tablet (50 mg total) by mouth 3 (three) times daily.  90 tablet  3  . isosorbide mononitrate (IMDUR) 30 MG 24 hr tablet Take 2 tablets (60 mg total) by mouth daily.  30 tablet  11  . levothyroxine (SYNTHROID, LEVOTHROID) 125 MCG tablet Take 2 tablets (250 mcg total) by mouth daily before breakfast.  60 tablet  2  . losartan (COZAAR) 100 MG tablet Take 1 tablet (100 mg total) by mouth daily.  30 tablet  3  . metFORMIN (GLUCOPHAGE) 500 MG tablet Take 1 tablet (500 mg total) by mouth 2 (two) times daily with a meal.  180 tablet  0  . potassium chloride (K-DUR) 10 MEQ tablet Take 1 tablet (10 mEq total) by mouth 2 (two) times daily.  90 tablet  1  . spironolactone (ALDACTONE) 25 MG tablet Take 0.5 tablets (12.5 mg total) by mouth  daily.  15 tablet  6   No current facility-administered medications on file prior to visit.    BP 160/70  Pulse 94  Temp(Src) 97.4 F (36.3 C) (Oral)  Ht 5\' 9"  (1.753 m)  Wt 220 lb (99.791 kg)  BMI 32.47 kg/m2  SpO2 97%    Objective:   Physical Exam  Constitutional: She is oriented to person, place, and time. She appears well-developed and well-nourished.  HENT:  Head: Normocephalic and atraumatic.  Mouth/Throat: Oropharynx is clear and moist.  Neck: Neck supple.  No carotid bruit  Cardiovascular: Normal rate, regular rhythm and normal heart sounds.   Pulmonary/Chest: Effort normal and breath sounds normal. She has no wheezes.  Musculoskeletal: She exhibits no edema.  No pain with passive flexion and extension of right knee, no knee redness or swelling  Lymphadenopathy:    She has no cervical adenopathy.  Neurological: She is alert and oriented to person, place, and time. No cranial nerve deficit.  Skin: Skin is warm and dry.  See diabetic foot exam  Psychiatric: She has a normal mood and affect. Her behavior is normal.        Assessment & Plan:

## 2013-07-21 NOTE — Assessment & Plan Note (Signed)
Patient's blood pressure is poorly controlled. Discontinue losartan. Switch to valsartan 160 mg. Patient advised to take full dose of Aldactone 25 mg once daily. Patient advised to discontinue potassium supplementation. Consult [facility]  nurse for assistance with medication and to avoid CHF exacerbations.

## 2013-07-21 NOTE — Assessment & Plan Note (Signed)
I strongly encouraged complete smoking cessation.  She will rejoin smoking cessation classes.

## 2013-07-21 NOTE — Assessment & Plan Note (Signed)
TSH at goal on current dose of thyroid replacement.  Lab Results  Component Value Date   TSH 0.39 07/14/2013

## 2013-07-21 NOTE — Progress Notes (Signed)
Pre visit review using our clinic review tool, if applicable. No additional management support is needed unless otherwise documented below in the visit note. 

## 2013-07-21 NOTE — Patient Instructions (Signed)
Please complete the following lab tests 2-3 days before your next follow up appointment: BMET - 401.9

## 2013-07-21 NOTE — Assessment & Plan Note (Signed)
Stable.  Continue same dose of metformin and low carb diet. Lab Results  Component Value Date   HGBA1C 7.0* 07/14/2013

## 2013-07-21 NOTE — Assessment & Plan Note (Addendum)
Patient encouraged to wear diabetic shoes.  New hand written rx provided.

## 2013-07-21 NOTE — Assessment & Plan Note (Signed)
Patient reports poorly fitting mask.  She has follow up with Dr. Gwenette Greet.

## 2013-07-22 ENCOUNTER — Telehealth: Payer: Self-pay | Admitting: Internal Medicine

## 2013-07-22 NOTE — Telephone Encounter (Signed)
Relevant patient education assigned to patient using Emmi. ° °

## 2013-07-23 ENCOUNTER — Encounter: Payer: Self-pay | Admitting: Internal Medicine

## 2013-08-06 ENCOUNTER — Ambulatory Visit: Payer: Medicare PPO | Admitting: Internal Medicine

## 2013-08-07 ENCOUNTER — Other Ambulatory Visit (INDEPENDENT_AMBULATORY_CARE_PROVIDER_SITE_OTHER): Payer: Medicare PPO

## 2013-08-07 DIAGNOSIS — I10 Essential (primary) hypertension: Secondary | ICD-10-CM

## 2013-08-07 DIAGNOSIS — E039 Hypothyroidism, unspecified: Secondary | ICD-10-CM

## 2013-08-07 LAB — BASIC METABOLIC PANEL
BUN: 15 mg/dL (ref 6–23)
CALCIUM: 9.5 mg/dL (ref 8.4–10.5)
CO2: 27 mEq/L (ref 19–32)
CREATININE: 1.1 mg/dL (ref 0.4–1.2)
Chloride: 108 mEq/L (ref 96–112)
GFR: 63.82 mL/min (ref >60.00)
GLUCOSE: 104 mg/dL — AB (ref 70–99)
Potassium: 4 mEq/L (ref 3.5–5.1)
SODIUM: 142 meq/L (ref 135–145)

## 2013-08-07 LAB — TSH: TSH: 0.04 u[IU]/mL — ABNORMAL LOW (ref 0.35–4.50)

## 2013-08-13 ENCOUNTER — Ambulatory Visit: Payer: Medicare PPO | Admitting: Internal Medicine

## 2013-09-01 ENCOUNTER — Other Ambulatory Visit: Payer: Medicare PPO

## 2013-10-02 ENCOUNTER — Telehealth: Payer: Self-pay

## 2013-10-02 NOTE — Telephone Encounter (Signed)
FYIVaughan Basta from [facility] has been trying to reach pt with no success.  An outreach letter was mailed to pt with no response.

## 2013-10-15 ENCOUNTER — Other Ambulatory Visit: Payer: Self-pay | Admitting: Internal Medicine

## 2013-12-15 ENCOUNTER — Other Ambulatory Visit: Payer: Self-pay | Admitting: Internal Medicine

## 2014-01-29 ENCOUNTER — Ambulatory Visit (INDEPENDENT_AMBULATORY_CARE_PROVIDER_SITE_OTHER): Payer: Medicare PPO | Admitting: Family Medicine

## 2014-01-29 VITALS — BP 170/90 | HR 82 | Temp 98.3°F | Resp 18 | Ht 69.5 in | Wt 229.0 lb

## 2014-01-29 DIAGNOSIS — I10 Essential (primary) hypertension: Secondary | ICD-10-CM

## 2014-01-29 DIAGNOSIS — J0110 Acute frontal sinusitis, unspecified: Secondary | ICD-10-CM

## 2014-01-29 MED ORDER — AMOXICILLIN 500 MG PO CAPS: 1000.0000 mg | ORAL_CAPSULE | Freq: Two times a day (BID) | ORAL | Status: DC

## 2014-01-29 NOTE — Progress Notes (Signed)
[facility] [address] [place] [address] [phone]  Date:  01/29/2014   Name:  Paula Stein   DOB:  04/22/1945   MRN:  [id number]  PCP:  [name], DO    Chief Complaint: Sinus Congestion   History of Present Illness:  Paula Stein is a 68 y.o. very pleasant female patient who presents with the following:  Here today with illness. She has noted 3 weeks of possible sinus infection.  She had sinus/ face congestion.  She seemed to be getting better, but her sx returned over the last 4 days or so.  She has also noted that her eyes seem to be "sticky" in the morning.  She does not have discharge, but her bilateral eyes are a bit sticky in the corners- not stuck shut, vision is normal Nose is stuffy Her chest feels ok, no CP.  No headache or vision change. She did feel a little dizzy a couple of weeks ago but this is now resolved She has not noted a fever.   She uses CPAP at night  History of DM, HTN  She has checked her BP at the drug store- over hte last 3 weeks she has noted that her BP is not controlled as well as usual, she had a reading up to 220/140.  She has not noted any sx but is getting concerned.  Denies any use of OTC decongestants or cold products   BP Readings from Last 3 Encounters:  01/29/14 182/104  07/21/13 160/70  07/07/13 162/84     Patient Active Problem List   Diagnosis Date Noted  . Smoker 10/30/2012  . 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 Hypokalemia 11/16/2010  . DeQuervain's disease (tenosynovitis) 09/01/2010  . POLYNEUROPATHY IN DIABETES 04/20/2010  . COUGH 01/03/2010  . Chronic systolic heart failure [id number]  . ATHEROSCLEROSIS W /INT CLAUDICATION 12/07/2008  . Type II or unspecified type diabetes mellitus without mention of complication, uncontrolled 10/29/2008  . LEG CRAMPS 10/29/2008  . HYPERLIPIDEMIA 05/16/2007  . Nonischemic cardiomyopathy 03/12/2007  . HYPOTHYROIDISM 01/31/2007  . HYPERTENSION  01/31/2007  . PERICARDIAL EFFUSION 01/31/2007  . GERD 01/31/2007    Past Medical History  Diagnosis Date  . Hypertension   . Personal history of goiter   . S/P thyroidectomy   . S/P radioactive iodine thyroid ablation   . Iatrogenic thyroiditis   . Non-ischemic cardiomyopathy     EF 28%- reassessment of LV function 2011 with LVEf 45-50%  . History of cardiac cath 05/2007    normal-with patent coronaries  . PAD (peripheral artery disease)     lower extremities with ABIs of 0.5 bilaterally  . History of colonoscopy   . Diabetes mellitus without complication   . CHF (congestive heart failure)     Past Surgical History  Procedure Laterality Date  . Total abdominal hysterectomy    . Thyroidectomy    . Tonsillectomy      History  Substance Use Topics  . Smoking status: Light Tobacco Smoker -- 40 years  . Smokeless tobacco: Not on file     Comment: Smokes 3 cigs week  . Alcohol Use: No    Family History  Problem Relation Age of Onset  . Diabetes type II Mother   . Hypertension Mother   . Diabetes Mother   . Heart disease Mother   . Other Father     deceased from accident age 28  . Hyperlipidemia Father  Allergies  Allergen Reactions  . Codeine     Medication list has been reviewed and updated.  Current Outpatient Prescriptions on File Prior to Visit  Medication Sig Dispense Refill  . aspirin 81 MG tablet Take 81 mg by mouth daily.        Marland Kitchen atorvastatin (LIPITOR) 40 MG tablet take 1 tablet by mouth once daily  90 tablet  1  . carvedilol (COREG) 25 MG tablet take 1 tablet by mouth twice a day  180 tablet  0  . furosemide (LASIX) 40 MG tablet Take 1 tablet (40 mg total) by mouth 3 (three) times daily.  90 tablet  1  . glucose blood (ONE TOUCH TEST STRIPS) test strip Use as instructed to check blood sugar once a day       . hydrALAZINE (APRESOLINE) 50 MG tablet Take 1 tablet (50 mg total) by mouth 3 (three) times daily.  90 tablet  3  . isosorbide mononitrate  (IMDUR) 30 MG 24 hr tablet Take 2 tablets (60 mg total) by mouth daily.  30 tablet  11  . levothyroxine (SYNTHROID, LEVOTHROID) 125 MCG tablet Take 2 tablets (250 mcg total) by mouth daily before breakfast.  60 tablet  2  . metFORMIN (GLUCOPHAGE) 500 MG tablet Take 1 tablet (500 mg total) by mouth 2 (two) times daily with a meal.  180 tablet  0  . spironolactone (ALDACTONE) 25 MG tablet Take 1 tablet (25 mg total) by mouth daily.  90 tablet  1  . valsartan (DIOVAN) 160 MG tablet Take 1 tablet (160 mg total) by mouth daily.  90 tablet  1  . zolpidem (AMBIEN) 5 MG tablet take 1 tablet by mouth at bedtime if needed for sleep  30 tablet  3   No current facility-administered medications on file prior to visit.    Review of Systems:  As per HPI- otherwise negative.   Physical Examination: Filed Vitals:   01/29/14 0947  BP: 182/104  Pulse: 82  Temp: 98.3 F (36.8 C)  Resp: 18   Filed Vitals:   01/29/14 0947  Height: 5' 9.5" (1.765 m)  Weight: 229 lb (103.874 kg)   Body mass index is 33.34 kg/(m^2). Ideal Body Weight: Weight in (lb) to have BMI = 25: 171.4  GEN: WDWN, NAD, Non-toxic, A & O x 3, obese, looks well HEENT: Atraumatic, Normocephalic. Neck supple. No masses, No LAD.  Bilateral TM wnl, oropharynx normal.  PEERL,EOMI.   Ears and Nose: No external deformity. CV: RRR, No M/G/R. No JVD. No thrill. No extra heart sounds. PULM: CTA B, no wheezes, crackles, rhonchi. No retractions. No resp. distress. No accessory muscle use. EXTR: No c/c/e NEURO Normal gait.  PSYCH: Normally interactive. Conversant. Not depressed or anxious appearing.  Calm demeanor.    Assessment and Plan: Acute frontal sinusitis, recurrence not specified - Plan: amoxicillin (AMOXIL) 500 MG capsule  Essential hypertension  Will treat for persistent sinus pain with amoxicillin BP is out of control- She will increase her valsartan by 80 mg and see [place] in about 2 weeks to follow-up on her HTN. Also do flu  shot at follow-up visit  Signed Lamar Blinks, MD

## 2014-01-29 NOTE — Patient Instructions (Signed)
We are going to treat you for a sinus infection with amoxicillin twice a day. Let me know if you are not better in the next few days.   Increase your valsartan by another 1/2 tablet a day.  Please come and see [place] in about 2 weeks to follow-up on your blood pressure.

## 2014-02-02 ENCOUNTER — Encounter (HOSPITAL_COMMUNITY): Payer: Self-pay | Admitting: Emergency Medicine

## 2014-02-02 ENCOUNTER — Telehealth: Payer: Self-pay

## 2014-02-02 ENCOUNTER — Ambulatory Visit (INDEPENDENT_AMBULATORY_CARE_PROVIDER_SITE_OTHER): Payer: Medicare PPO

## 2014-02-02 ENCOUNTER — Ambulatory Visit (INDEPENDENT_AMBULATORY_CARE_PROVIDER_SITE_OTHER): Payer: Medicare PPO | Admitting: Family Medicine

## 2014-02-02 ENCOUNTER — Observation Stay (HOSPITAL_COMMUNITY)
Admission: EM | Admit: 2014-02-02 | Discharge: 2014-02-04 | Disposition: A | Discharge status: Home / Self Care | Payer: Medicare PPO | Attending: Internal Medicine | Admitting: Internal Medicine

## 2014-02-02 ENCOUNTER — Encounter: Payer: Self-pay | Admitting: Physician Assistant

## 2014-02-02 VITALS — BP 161/97 | HR 97 | Temp 98.1°F | Resp 32

## 2014-02-02 DIAGNOSIS — E119 Type 2 diabetes mellitus without complications: Secondary | ICD-10-CM | POA: Insufficient documentation

## 2014-02-02 DIAGNOSIS — I429 Cardiomyopathy, unspecified: Secondary | ICD-10-CM | POA: Diagnosis not present

## 2014-02-02 DIAGNOSIS — I5033 Acute on chronic diastolic (congestive) heart failure: Secondary | ICD-10-CM | POA: Insufficient documentation

## 2014-02-02 DIAGNOSIS — J329 Chronic sinusitis, unspecified: Secondary | ICD-10-CM | POA: Insufficient documentation

## 2014-02-02 DIAGNOSIS — E039 Hypothyroidism, unspecified: Secondary | ICD-10-CM | POA: Diagnosis not present

## 2014-02-02 DIAGNOSIS — Z23 Encounter for immunization: Secondary | ICD-10-CM | POA: Diagnosis not present

## 2014-02-02 DIAGNOSIS — I5043 Acute on chronic combined systolic (congestive) and diastolic (congestive) heart failure: Secondary | ICD-10-CM | POA: Diagnosis not present

## 2014-02-02 DIAGNOSIS — Z7982 Long term (current) use of aspirin: Secondary | ICD-10-CM | POA: Diagnosis not present

## 2014-02-02 DIAGNOSIS — F17210 Nicotine dependence, cigarettes, uncomplicated: Secondary | ICD-10-CM | POA: Diagnosis not present

## 2014-02-02 DIAGNOSIS — I509 Heart failure, unspecified: Secondary | ICD-10-CM | POA: Insufficient documentation

## 2014-02-02 DIAGNOSIS — I10 Essential (primary) hypertension: Secondary | ICD-10-CM | POA: Insufficient documentation

## 2014-02-02 DIAGNOSIS — I428 Other cardiomyopathies: Secondary | ICD-10-CM

## 2014-02-02 DIAGNOSIS — E1169 Type 2 diabetes mellitus with other specified complication: Secondary | ICD-10-CM

## 2014-02-02 DIAGNOSIS — R0602 Shortness of breath: Secondary | ICD-10-CM

## 2014-02-02 DIAGNOSIS — Z79899 Other long term (current) drug therapy: Secondary | ICD-10-CM | POA: Diagnosis not present

## 2014-02-02 LAB — BASIC METABOLIC PANEL
Anion gap: 13 (ref 5–15)
BUN: 22 mg/dL (ref 6–23)
CO2: 23 mEq/L (ref 19–32)
Calcium: 9.6 mg/dL (ref 8.4–10.5)
Chloride: 103 mEq/L (ref 96–112)
Creatinine, Ser: 1.36 mg/dL — ABNORMAL HIGH (ref 0.50–1.10)
GFR, EST AFRICAN AMERICAN: 45 mL/min — AB (ref >90)
GFR, EST NON AFRICAN AMERICAN: 38 mL/min — AB (ref >90)
GLUCOSE: 100 mg/dL — AB (ref 70–99)
POTASSIUM: 4.4 meq/L (ref 3.7–5.3)
SODIUM: 139 meq/L (ref 137–147)

## 2014-02-02 LAB — CBC
HCT: 38.8 % (ref 36.0–46.0)
Hemoglobin: 12.2 g/dL (ref 12.0–15.0)
MCH: 29.8 pg (ref 26.0–34.0)
MCHC: 31.4 g/dL (ref 30.0–36.0)
MCV: 94.6 fL (ref 78.0–100.0)
Platelets: 262 10*3/uL (ref 150–400)
RBC: 4.1 MIL/uL (ref 3.87–5.11)
RDW: 14.8 % (ref 11.5–15.5)
WBC: 7.4 10*3/uL (ref 4.0–10.5)

## 2014-02-02 LAB — GLUCOSE, CAPILLARY: GLUCOSE-CAPILLARY: 151 mg/dL — AB (ref 70–99)

## 2014-02-02 LAB — I-STAT TROPONIN, ED: Troponin i, poc: 0.05 ng/mL (ref 0.00–0.08)

## 2014-02-02 LAB — PRO B NATRIURETIC PEPTIDE: PRO B NATRI PEPTIDE: 2394 pg/mL — AB (ref 0–125)

## 2014-02-02 MED ORDER — ACETAMINOPHEN 650 MG RE SUPP: 650.0000 mg | Freq: Four times a day (QID) | RECTAL | Status: DC | PRN

## 2014-02-02 MED ORDER — CARVEDILOL 25 MG PO TABS
25.0000 mg | ORAL_TABLET | Freq: Two times a day (BID) | ORAL | Status: DC
  Administered 2014-02-03 – 2014-02-04 (×4): 25 mg via ORAL
  Filled 2014-02-02 (×6): qty 1

## 2014-02-02 MED ORDER — ASPIRIN EC 81 MG PO TBEC
81.0000 mg | DELAYED_RELEASE_TABLET | Freq: Every day | ORAL | Status: DC
  Administered 2014-02-03 – 2014-02-04 (×2): 81 mg via ORAL
  Filled 2014-02-02 (×2): qty 1

## 2014-02-02 MED ORDER — SODIUM CHLORIDE 0.9 % IJ SOLN
3.0000 mL | Freq: Two times a day (BID) | INTRAMUSCULAR | Status: DC
  Administered 2014-02-03 – 2014-02-04 (×4): 3 mL via INTRAVENOUS

## 2014-02-02 MED ORDER — FUROSEMIDE 10 MG/ML IJ SOLN
40.0000 mg | Freq: Once | INTRAMUSCULAR | Status: AC
  Administered 2014-02-02: 40 mg via INTRAVENOUS
  Filled 2014-02-02: qty 4

## 2014-02-02 MED ORDER — NITROGLYCERIN 2 % TD OINT
0.5000 [in_us] | TOPICAL_OINTMENT | Freq: Four times a day (QID) | TRANSDERMAL | Status: DC
  Administered 2014-02-02 – 2014-02-04 (×7): 0.5 [in_us] via TOPICAL
  Filled 2014-02-02: qty 30
  Filled 2014-02-02: qty 1

## 2014-02-02 MED ORDER — FUROSEMIDE 40 MG PO TABS: 40.0000 mg | ORAL_TABLET | Freq: Three times a day (TID) | ORAL | Status: DC

## 2014-02-02 MED ORDER — SPIRONOLACTONE 25 MG PO TABS
25.0000 mg | ORAL_TABLET | Freq: Every day | ORAL | Status: DC
  Administered 2014-02-03 – 2014-02-04 (×2): 25 mg via ORAL
  Filled 2014-02-02 (×2): qty 1

## 2014-02-02 MED ORDER — INFLUENZA VAC SPLIT QUAD 0.5 ML IM SUSY
0.5000 mL | PREFILLED_SYRINGE | INTRAMUSCULAR | Status: AC
  Administered 2014-02-03: 0.5 mL via INTRAMUSCULAR
  Filled 2014-02-02: qty 0.5

## 2014-02-02 MED ORDER — HEPARIN SODIUM (PORCINE) 5000 UNIT/ML IJ SOLN
5000.0000 [IU] | Freq: Three times a day (TID) | INTRAMUSCULAR | Status: DC
  Administered 2014-02-03 – 2014-02-04 (×4): 5000 [IU] via SUBCUTANEOUS
  Filled 2014-02-02 (×7): qty 1

## 2014-02-02 MED ORDER — ACETAMINOPHEN 325 MG PO TABS: 650.0000 mg | ORAL_TABLET | Freq: Four times a day (QID) | ORAL | Status: DC | PRN

## 2014-02-02 MED ORDER — ATORVASTATIN CALCIUM 40 MG PO TABS
40.0000 mg | ORAL_TABLET | Freq: Every day | ORAL | Status: DC
  Administered 2014-02-03: 40 mg via ORAL
  Filled 2014-02-02 (×2): qty 1

## 2014-02-02 MED ORDER — INSULIN ASPART 100 UNIT/ML ~~LOC~~ SOLN
0.0000 [IU] | Freq: Every day | SUBCUTANEOUS | Status: DC
Start: 2014-02-03 — End: 2014-02-04

## 2014-02-02 MED ORDER — PNEUMOCOCCAL VAC POLYVALENT 25 MCG/0.5ML IJ INJ
0.5000 mL | INJECTION | INTRAMUSCULAR | Status: AC
  Administered 2014-02-03: 0.5 mL via INTRAMUSCULAR
  Filled 2014-02-02: qty 0.5

## 2014-02-02 MED ORDER — ISOSORBIDE MONONITRATE ER 60 MG PO TB24
60.0000 mg | ORAL_TABLET | Freq: Every day | ORAL | Status: DC
  Administered 2014-02-03 – 2014-02-04 (×2): 60 mg via ORAL
  Filled 2014-02-02 (×2): qty 1

## 2014-02-02 MED ORDER — HYDRALAZINE HCL 50 MG PO TABS
50.0000 mg | ORAL_TABLET | Freq: Three times a day (TID) | ORAL | Status: DC
  Administered 2014-02-03 – 2014-02-04 (×5): 50 mg via ORAL
  Filled 2014-02-02 (×7): qty 1

## 2014-02-02 MED ORDER — AMOXICILLIN 500 MG PO CAPS
1000.0000 mg | ORAL_CAPSULE | Freq: Two times a day (BID) | ORAL | Status: DC
  Administered 2014-02-03 – 2014-02-04 (×3): 1000 mg via ORAL
  Filled 2014-02-02 (×4): qty 2

## 2014-02-02 MED ORDER — IRBESARTAN 150 MG PO TABS
150.0000 mg | ORAL_TABLET | Freq: Every day | ORAL | Status: DC
  Administered 2014-02-03 – 2014-02-04 (×2): 150 mg via ORAL
  Filled 2014-02-02 (×2): qty 1

## 2014-02-02 MED ORDER — ONDANSETRON HCL 4 MG PO TABS: 4.0000 mg | ORAL_TABLET | Freq: Four times a day (QID) | ORAL | Status: DC | PRN

## 2014-02-02 MED ORDER — LEVOTHYROXINE SODIUM 125 MCG PO TABS
250.0000 ug | ORAL_TABLET | Freq: Every day | ORAL | Status: DC
  Administered 2014-02-03 – 2014-02-04 (×2): 250 ug via ORAL
  Filled 2014-02-02 (×3): qty 2

## 2014-02-02 MED ORDER — INSULIN ASPART 100 UNIT/ML ~~LOC~~ SOLN: 0.0000 [IU] | Freq: Three times a day (TID) | SUBCUTANEOUS | Status: DC

## 2014-02-02 MED ORDER — FUROSEMIDE 10 MG/ML IJ SOLN
40.0000 mg | Freq: Every day | INTRAMUSCULAR | Status: DC
  Administered 2014-02-03: 40 mg via INTRAVENOUS
  Filled 2014-02-02 (×2): qty 4

## 2014-02-02 MED ORDER — ONDANSETRON HCL 4 MG/2ML IJ SOLN: 4.0000 mg | Freq: Four times a day (QID) | INTRAMUSCULAR | Status: DC | PRN

## 2014-02-02 NOTE — Progress Notes (Signed)
Discussed patient with the physician assistant. We then reviewed the x-ray which shows extreme cardiomegaly and congestive changes and curly B-lines. She is dyspneic, could be heard out in the nursing station area.  She has very poor air exchange on auscultation, mild JVD and HJR.  This patient needs to be transported to the hospital for more aggressive treatment of congestive heart failure.

## 2014-02-02 NOTE — ED Notes (Signed)
Discussed plan of care with Tatyana, pa-c.  Total output post lasix so far is 726mL, and trial on room air oxygen reads 94%.

## 2014-02-02 NOTE — Progress Notes (Signed)
IDENTIFYING INFORMATION  Paula Stein / DOB: [birth year] / MRN: [id number]  The patient has HYPOTHYROIDISM; Type II or unspecified type diabetes mellitus without mention of complication, uncontrolled; HYPERLIPIDEMIA; HYPERTENSION; PERICARDIAL EFFUSION; Nonischemic cardiomyopathy; Chronic systolic heart failure; ATHEROSCLEROSIS W /INT CLAUDICATION; GERD; LEG CRAMPS; COUGH; POLYNEUROPATHY IN DIABETES; DeQuervain's disease (tenosynovitis); Hypokalemia; Complex sleep apnea syndrome; and Smoker on her problem list.  SUBJECTIVE  Chief Complaint: No chief complaint on file.   History of present illness: Paula Stein is a 68 y.o. year old female who presents with the chief complaint of shortness of breath.  She complains that she is unable to sleep at night, and has been sleeping in her recliner and using 3-4 pillows to prop herself up.  She reports that if she lays back all the way back she feels like she is drowning.  She denies any swelling in the lower extremity and trunk, but does report a six pound weight gain in the last 3 weeks.  She was diagnosed with CHF roughly 3 years ago, and reports that her these symptoms are identical to how she felt when she was initially diagnosed.  She denies a change in medication or significant changes in diet that could have precipitated this event. She denies a history of asthma.  She denies a recent history of plane travel or extended travel in a vehicle.    She  has a past medical history of Hypertension; Personal history of goiter; S/P thyroidectomy; S/P radioactive iodine thyroid ablation; Iatrogenic thyroiditis; Non-ischemic cardiomyopathy; History of cardiac cath (05/2007); PAD (peripheral artery disease); History of colonoscopy; Diabetes mellitus without complication; and CHF (congestive heart failure).  The patient has a current medication list which includes the following prescription(s): amoxicillin, aspirin, atorvastatin, carvedilol, furosemide, glucose blood,  hydralazine, isosorbide mononitrate, levothyroxine, metformin, spironolactone, valsartan, and zolpidem.  Paula Stein is allergic to codeine. She  reports that she has been smoking.  She does not have any smokeless tobacco history on file. She reports that she does not drink alcohol or use illicit drugs. She  reports that she does not currently engage in sexual activity.  The patient  has past surgical history that includes Total abdominal hysterectomy; Thyroidectomy; and Tonsillectomy.  Her family history includes Diabetes in her mother; Diabetes type II in her mother; Heart disease in her mother; Hyperlipidemia in her father; Hypertension in her mother; Other in her father.  Review of Systems  Constitutional: Negative for fever, chills, weight loss, malaise/fatigue and diaphoresis.  HENT: Negative.   Eyes: Negative.   Respiratory: Positive for cough, sputum production (clear, not frothy) and shortness of breath. Negative for hemoptysis and wheezing.   Cardiovascular: Positive for orthopnea and PND.  Gastrointestinal: Negative.   Genitourinary: Negative.   Skin: Negative.   Neurological: Negative.  Negative for weakness.    OBJECTIVE  Blood pressure 161/97, pulse 97, temperature 98.1 F (36.7 C), respirations 32,  temperature source Oral, SpO2 96 %. The patient's body mass index is unknown because there is no weight on file. SpO2 initially 89% upon walking into clinic.    Physical Exam  Constitutional: She is oriented to person, place, and time.  HENT:  Head: Normocephalic.  Neck: Normal range of motion. JVD present. No tracheal deviation present.  Cardiovascular: Normal rate and regular rhythm.  Exam reveals gallop.   Pulmonary/Chest: Effort normal and breath sounds normal. No stridor. Tachypnea noted. No respiratory distress. She has no rales.  Abdominal: Soft. Bowel sounds are normal. She exhibits distension. She  exhibits no mass. There is no tenderness.  Musculoskeletal: Normal range  of motion.  Lymphadenopathy:    She has no cervical adenopathy.  Neurological: She is alert and oriented to person, place, and time. Gait normal.  Skin: Skin is warm and dry. She is not diaphoretic.    No results found for this or any previous visit (from the past 24 hour(s)).  [facility] reading (PRIMARY) by  Dr. Linna Darner: Significant for cardiomegaly with curly B lines.   ASSESSMENT & PLAN  Mang was seen today for no specified reason.  Diagnoses and associated orders for this visit:  SOB (shortness of breath) - DG Chest 2 View; Future  CHF exacerbation -     Patient sent out for inpatient evaluation and treatment.       The patient was instructed to to call or comeback to clinic as needed, or should symptoms warrant.  Philis Fendt, MHS, PA-C [facility] 02/02/2014 6:07 PM

## 2014-02-02 NOTE — ED Notes (Signed)
Discussed plan of care with Tatyanan, PA-C. X-ray is available from urgent care, and labs have been ordered under protocol.  She advises to administer lasix for shortness of breath management, along with nitro.

## 2014-02-02 NOTE — ED Notes (Signed)
Per EMS, the patient comes from urgent care for shortness of breath for 1 week.  Chest x-ray was completed today at urgent care, and she has history of CHF, recent gain in weight 5-7 lbs this week.  O2 sats were 89% on room air, 100% on 3L with ems.  Sleeps with bipap.  20g placed in right forearm.  No meds enroute. bp 170/110, p 94, o2 100% on room air, cbg 109

## 2014-02-02 NOTE — H&P (Addendum)
[facility] History and Physical  Patient: Paula Stein  [id number]  DOB: 1945-11-20  DOS: the patient was seen and examined on 02/02/2014 PCP: [name], DO  Chief Complaint: shortness of breath  HPI: Paula Stein is a 68 y.o. female with Past medical history of hypertension, hypothyroidism, nonischemic cardiomyopathy, peripheral artery disease, diabetes mellitus. The patient presented with complaints of progressively worsening shortness of breath ongoing since last few weeks and progressively worsening since last couple of days. She also had a cough. She went to urgent care with this complaint and she also had some sinus pain and therefore she was started on oral antibiotics and was recommended to follow-up. Since she was not feeling better and continues to have progressively worsening shortness of breath she went to the urgent care again and they referred her here for further workup. She mentions she is compliant with all her medication and has not had missed any medications. She denies any smoking or alcohol abuse. She denies any significant weight gain as well. No recent travel or surgery.  The patient is coming from home. And at her baseline independent for most of her ADL.  Review of Systems: as mentioned in the history of present illness.  A Comprehensive review of the other systems is negative.  Past Medical History  Diagnosis Date  . Hypertension   . Personal history of goiter   . S/P thyroidectomy   . S/P radioactive iodine thyroid ablation   . Iatrogenic thyroiditis   . Non-ischemic cardiomyopathy     EF 28%- reassessment of LV function 2011 with LVEf 45-50%  . History of cardiac cath 05/2007    normal-with patent coronaries  . PAD (peripheral artery disease)     lower extremities with ABIs of 0.5 bilaterally  . History of colonoscopy   . Diabetes mellitus without complication   . CHF (congestive heart failure)    Past Surgical History  Procedure  Laterality Date  . Total abdominal hysterectomy    . Thyroidectomy    . Tonsillectomy     Social History:  reports that she has been smoking.  She does not have any smokeless tobacco history on file. She reports that she does not drink alcohol or use illicit drugs.  Allergies  Allergen Reactions  . Codeine Nausea And Vomiting    Family History  Problem Relation Age of Onset  . Diabetes type II Mother   . Hypertension Mother   . Diabetes Mother   . Heart disease Mother   . Other Father     deceased from accident age 22  . Hyperlipidemia Father     Prior to Admission medications   Medication Sig Start Date End Date Taking? Authorizing Provider  amoxicillin (AMOXIL) 500 MG capsule Take 2 capsules (1,000 mg total) by mouth 2 (two) times daily. 01/29/14  Yes Gay Filler Copland, MD  aspirin 81 MG tablet Take 81 mg by mouth daily.     Yes Historical Provider, MD  atorvastatin (LIPITOR) 40 MG tablet Take 40 mg by mouth daily at 6 PM.   Yes Historical Provider, MD  carvedilol (COREG) 25 MG tablet Take 25 mg by mouth 2 (two) times daily with a meal.   Yes Historical Provider, MD  furosemide (LASIX) 40 MG tablet Take 1 tablet (40 mg total) by mouth 3 (three) times daily. 07/21/13  Yes Doe-Hyun Kyra Searles, DO  hydrALAZINE (APRESOLINE) 50 MG tablet Take 1 tablet (50 mg total) by mouth 3 (three) times daily. 12/03/12  Yes Amy D Clegg, NP  isosorbide mononitrate (IMDUR) 30 MG 24 hr tablet Take 2 tablets (60 mg total) by mouth daily. 12/17/12  Yes [name], MD  levothyroxine (SYNTHROID, LEVOTHROID) 125 MCG tablet Take 2 tablets (250 mcg total) by mouth daily before breakfast. 07/02/13  Yes Doe-Hyun R Shawna Orleans, DO  metFORMIN (GLUCOPHAGE) 500 MG tablet Take 500 mg by mouth 3 (three) times daily.   Yes Historical Provider, MD  spironolactone (ALDACTONE) 25 MG tablet Take 1 tablet (25 mg total) by mouth daily. 07/21/13  Yes Doe-Hyun R Shawna Orleans, DO  valsartan (DIOVAN) 160 MG tablet Take 240 mg by mouth daily.   Yes  Historical Provider, MD  atorvastatin (LIPITOR) 40 MG tablet take 1 tablet by mouth once daily Patient not taking: Reported on 02/02/2014 01/24/13   Doe-Hyun R Shawna Orleans, DO  glucose blood (ONE TOUCH TEST STRIPS) test strip Use as instructed to check blood sugar once a day     Historical Provider, MD  metFORMIN (GLUCOPHAGE) 500 MG tablet Take 1 tablet (500 mg total) by mouth 2 (two) times daily with a meal. 06/27/13   Doe-Hyun R Shawna Orleans, DO  valsartan (DIOVAN) 160 MG tablet Take 1 tablet (160 mg total) by mouth daily. 07/21/13   Doe-Hyun R Shawna Orleans, DO  zolpidem (AMBIEN) 5 MG tablet take 1 tablet by mouth at bedtime if needed for sleep 10/15/13   Doe-Hyun Kyra Searles, DO    Physical Exam: Filed Vitals:   02/02/14 2157 02/02/14 2215 02/02/14 2230 02/02/14 2300  BP: 173/86 133/82 158/87 171/98  Pulse: 85 79 83 85  Temp:   97.7 F (36.5 C) 97.4 F (36.3 C)  TempSrc:   Oral Oral  Resp: 24 22 22 20   Height:    5' 9.5" (1.765 m)  Weight:    99.2 kg (218 lb 11.1 oz)  SpO2: 98% 92% 97% 99%    General: Alert, Awake and Oriented to Time, Place and Person. Appear in mild distress Eyes: PERRL ENT: Oral Mucosa clear moist. Neck: mild JVD Cardiovascular: S1 and S2 Present, aortic systolic Murmur, Peripheral Pulses Present Respiratory: Bilateral Air entry equal and Decreased, bilateral basalCrackles, none wheezes Abdomen: Bowel Sound present, Soft and none tender Skin: no Rash Extremities: trace Pedal edema, no calf tenderness Neurologic: Grossly no focal neuro deficit.  Labs on Admission:  CBC:  Recent Labs Lab 02/02/14 2004  WBC 7.4  HGB 12.2  HCT 38.8  MCV 94.6  PLT 262    CMP     Component Value Date/Time   NA 139 02/02/2014 2004   K 4.4 02/02/2014 2004   CL 103 02/02/2014 2004   CO2 23 02/02/2014 2004   GLUCOSE 100* 02/02/2014 2004   BUN 22 02/02/2014 2004   CREATININE 1.36* 02/02/2014 2004   CALCIUM 9.6 02/02/2014 2004   PROT 8.4* 07/14/2013 0918   ALBUMIN 3.9 07/14/2013 0918   AST 15  07/14/2013 0918   ALT 11 07/14/2013 0918   ALKPHOS 90 07/14/2013 0918   BILITOT 0.6 07/14/2013 0918   GFRNONAA 38* 02/02/2014 2004   GFRAA 45* 02/02/2014 2004    No results for input(s): LIPASE, AMYLASE in the last 168 hours. No results for input(s): AMMONIA in the last 168 hours.  No results for input(s): CKTOTAL, CKMB, CKMBINDEX, TROPONINI in the last 168 hours. BNP (last 3 results)  Recent Labs  02/02/14 2004  PROBNP 2394.0*    Radiological Exams on Admission: Dg Chest 2 View  02/02/2014   CLINICAL DATA:  Acute  shortness of breath.  Difficulty lying down.  EXAM: CHEST  2 VIEW  COMPARISON:  None.  FINDINGS: The heart is enlarged. There is tortuosity of the thoracic aorta. The pulmonary hila appear normal. There is central vascular congestion without overt pulmonary edema. No pleural effusions. Streaky bibasilar atelectasis or scarring. The bony thorax is intact.  IMPRESSION: Cardiac enlargement and vascular congestion without overt pulmonary edema.  Streaky bibasilar atelectasis versus scarring.   Electronically Signed   By: Kalman Jewels M.D.   On: 02/02/2014 21:03    EKG: Independently reviewed. normal sinus rhythm, nonspecific ST and T waves changes.  Assessment/Plan Principal Problem:   Acute on chronic combined systolic and diastolic heart failure Active Problems:   Hypothyroidism   Type 2 diabetes mellitus   Essential hypertension   Nonischemic cardiomyopathy   1. Acute on chronic combined systolic and diastolic heart failure The patient is presenting with complaints of progressively worsening shortness of breath. She also has orthopnea and PND. She has significantly elevated proBNP and chest x-ray showing cardiovascular congestion. With this the patient has received Lasix and we will continue her on LasixIV twice a day.. Monitor daily ins and outs as well as daily weight.  2.accident and hypertension. Continue home medications.  3.diabetes mellitus. Hold  metformin and placing the patient on sliding scale.  4.hypothyroidism. Check TSH continue with Synthroid.  5. Sinusitis Complete a 7 day course of antibiotics.  Advance goals of care discussion: full code   DVT Prophylaxis: subcutaneous Heparin Nutrition: cardiac diet  Family Communication: family was present at bedside, opportunity was given to ask question and all questions were answered satisfactorily at the time of interview. Disposition: Admitted to observation in telemetry unit.  Author: Berle Mull, MD [facility] Pager: [phone] 02/02/2014, 11:48 PM    If 7PM-7AM, please contact night-coverage [email or web address] Password [email or web address]

## 2014-02-02 NOTE — ED Notes (Signed)
Attempted to call report

## 2014-02-02 NOTE — ED Notes (Signed)
Patient ambulated over 300 feet with nursing supervision.  Steady gait, no lightheadness or dizziness. O2 sat readings 99-100% on room air during walk.

## 2014-02-02 NOTE — Telephone Encounter (Signed)
Pt will be in tonight. Spoke to her and she is SOB, speaking short phrases. Pt refuses to go to ED.  Pt does not have transportation until 530. She will be in right after.

## 2014-02-02 NOTE — ED Provider Notes (Signed)
CSN: [id number]     Arrival date & time 02/02/14  1931 History   First MD Initiated Contact with Patient 02/02/14 1947     Chief Complaint  Patient presents with  . Shortness of Breath     (Consider location/radiation/quality/duration/timing/severity/associated sxs/prior Treatment) HPI Paula Stein is a 68 y.o. female who presents to ED with complaint of SOB. She states 4 days ago she developed URI symptoms, including nasal congestion, sinus pain. She went to UC, started on antibiotic that she has been taking since. States 3 days ago she developed a dry, non productive cough. States also developed SOB that since then has worsened. States having trouble laying down flat and sleeping. Denies swelling in extremities. Denies fever or chills. She states she felt SOB all day today and got fatigued fast after going to a grocery store today. Went back to UC, was sent here for further evaluation. PT denies calf pain, no recent travel or surgeries. No other complaints.   Past Medical History  Diagnosis Date  . Hypertension   . Personal history of goiter   . S/P thyroidectomy   . S/P radioactive iodine thyroid ablation   . Iatrogenic thyroiditis   . Non-ischemic cardiomyopathy     EF 28%- reassessment of LV function 2011 with LVEf 45-50%  . History of cardiac cath 05/2007    normal-with patent coronaries  . PAD (peripheral artery disease)     lower extremities with ABIs of 0.5 bilaterally  . History of colonoscopy   . Diabetes mellitus without complication   . CHF (congestive heart failure)    Past Surgical History  Procedure Laterality Date  . Total abdominal hysterectomy    . Thyroidectomy    . Tonsillectomy     Family History  Problem Relation Age of Onset  . Diabetes type II Mother   . Hypertension Mother   . Diabetes Mother   . Heart disease Mother   . Other Father     deceased from accident age 62  . Hyperlipidemia Father    History  Substance Use Topics  . Smoking status:  Light Tobacco Smoker -- 40 years  . Smokeless tobacco: Not on file     Comment: Smokes 3 cigs week  . Alcohol Use: No   OB History    No data available     Review of Systems  Constitutional: Negative for fever and chills.  HENT: Positive for congestion.   Respiratory: Positive for cough and shortness of breath. Negative for chest tightness.   Cardiovascular: Negative for chest pain, palpitations and leg swelling.  Gastrointestinal: Negative for nausea, vomiting, abdominal pain and diarrhea.  Genitourinary: Negative for dysuria, flank pain and pelvic pain.  Musculoskeletal: Negative for myalgias, arthralgias, neck pain and neck stiffness.  Skin: Negative for rash.  Neurological: Negative for dizziness, weakness and headaches.  All other systems reviewed and are negative.     Allergies  Codeine  Home Medications   Prior to Admission medications   Medication Sig Start Date End Date Taking? Authorizing Provider  amoxicillin (AMOXIL) 500 MG capsule Take 2 capsules (1,000 mg total) by mouth 2 (two) times daily. 01/29/14  Yes Gay Filler Copland, MD  aspirin 81 MG tablet Take 81 mg by mouth daily.     Yes Historical Provider, MD  atorvastatin (LIPITOR) 40 MG tablet Take 40 mg by mouth daily at 6 PM.   Yes Historical Provider, MD  carvedilol (COREG) 25 MG tablet Take 25 mg by mouth 2 (two)  times daily with a meal.   Yes Historical Provider, MD  furosemide (LASIX) 40 MG tablet Take 1 tablet (40 mg total) by mouth 3 (three) times daily. 07/21/13  Yes Doe-Hyun Kyra Searles, DO  hydrALAZINE (APRESOLINE) 50 MG tablet Take 1 tablet (50 mg total) by mouth 3 (three) times daily. 12/03/12  Yes Amy D Clegg, NP  isosorbide mononitrate (IMDUR) 30 MG 24 hr tablet Take 2 tablets (60 mg total) by mouth daily. 12/17/12  Yes [name], MD  levothyroxine (SYNTHROID, LEVOTHROID) 125 MCG tablet Take 2 tablets (250 mcg total) by mouth daily before breakfast. 07/02/13  Yes Doe-Hyun R Shawna Orleans, DO  metFORMIN (GLUCOPHAGE)  500 MG tablet Take 500 mg by mouth 3 (three) times daily.   Yes Historical Provider, MD  spironolactone (ALDACTONE) 25 MG tablet Take 1 tablet (25 mg total) by mouth daily. 07/21/13  Yes Doe-Hyun R Shawna Orleans, DO  valsartan (DIOVAN) 160 MG tablet Take 240 mg by mouth daily.   Yes Historical Provider, MD  atorvastatin (LIPITOR) 40 MG tablet take 1 tablet by mouth once daily Patient not taking: Reported on 02/02/2014 01/24/13   Doe-Hyun R Shawna Orleans, DO  glucose blood (ONE TOUCH TEST STRIPS) test strip Use as instructed to check blood sugar once a day     Historical Provider, MD  metFORMIN (GLUCOPHAGE) 500 MG tablet Take 1 tablet (500 mg total) by mouth 2 (two) times daily with a meal. 06/27/13   Doe-Hyun R Shawna Orleans, DO  valsartan (DIOVAN) 160 MG tablet Take 1 tablet (160 mg total) by mouth daily. 07/21/13   Doe-Hyun R Shawna Orleans, DO  zolpidem (AMBIEN) 5 MG tablet take 1 tablet by mouth at bedtime if needed for sleep 10/15/13   Doe-Hyun R Yoo, DO   BP 185/109 mmHg  Pulse 93  Temp(Src) 98.1 F (36.7 C) (Oral)  Resp 22  Ht 5\' 9"  (1.753 m)  Wt 210 lb (95.255 kg)  BMI 31.00 kg/m2  SpO2 100% Physical Exam  Constitutional: She appears well-developed and well-nourished. No distress.  HENT:  Head: Normocephalic.  Eyes: Conjunctivae are normal.  Neck: Neck supple.  Cardiovascular: Normal rate, regular rhythm and normal heart sounds.   Pulmonary/Chest: Effort normal. No respiratory distress. She has no wheezes. She has rales. She exhibits no tenderness.  Rales at bases. tachypneic  Abdominal: Soft. Bowel sounds are normal. She exhibits no distension. There is no tenderness. There is no rebound.  Musculoskeletal: She exhibits edema.  Trace bilateral LE edema  Neurological: She is alert.  Skin: Skin is warm and dry.  Psychiatric: She has a normal mood and affect. Her behavior is normal.  Nursing note and vitals reviewed.   ED Course  Procedures (including critical care time) Labs Review Labs Reviewed  BASIC METABOLIC  PANEL - Abnormal; Notable for the following:    Glucose, Bld 100 (*)    Creatinine, Ser 1.36 (*)    GFR calc non Af Amer 38 (*)    GFR calc Af Amer 45 (*)    All other components within normal limits  PRO B NATRIURETIC PEPTIDE - Abnormal; Notable for the following:    Pro B Natriuretic peptide (BNP) 2394.0 (*)    All other components within normal limits  GLUCOSE, CAPILLARY - Abnormal; Notable for the following:    Glucose-Capillary 151 (*)    All other components within normal limits  CBC  TSH  TROPONIN I  TROPONIN I  TROPONIN I  COMPREHENSIVE METABOLIC PANEL  CBC  PROTIME-INR  I-STAT TROPOININ,  ED    Imaging Review Dg Chest 2 View  02/02/2014   CLINICAL DATA:  Acute shortness of breath.  Difficulty lying down.  EXAM: CHEST  2 VIEW  COMPARISON:  None.  FINDINGS: The heart is enlarged. There is tortuosity of the thoracic aorta. The pulmonary hila appear normal. There is central vascular congestion without overt pulmonary edema. No pleural effusions. Streaky bibasilar atelectasis or scarring. The bony thorax is intact.  IMPRESSION: Cardiac enlargement and vascular congestion without overt pulmonary edema.  Streaky bibasilar atelectasis versus scarring.   Electronically Signed   By: Kalman Jewels M.D.   On: 02/02/2014 21:03     Date: 02/03/2014  Rate: 93  Rhythm: normal sinus rhythm  QRS Axis: normal  Intervals: PR prolonged  ST/T Wave abnormalities: normal  Conduction Disutrbances:none  Narrative Interpretation:   Old EKG Reviewed: unchanged    MDM   Final diagnoses:  CHF exacerbation    Recent here with worsening shortness of breath for several days, orthopnea, congestion, dry cough. Was sent here from urgent care, where she was found to have oxygen saturation in high 80s. On exam, patient does have some Rales at bases, she is to Speaking in 2-3 word sentences. We'll get labs, chest x-ray was done at urgent care, read is pending.   Patient's chest x-ray showing  vascular congestion, no pulmonary edema. BNP elevated, otherwise labs unremarkable. Patient unable to lay down flat, states severe shortness of breath. Lasix ordered. Nitropaste ordered. Patient was able to ambulate in the hallway, she became very short of breath, however was able to maintain her oxygen saturation above 90.  Patient is feeling better after Lasix, however still unable to lay down flat and still tachypneic. Will admit for observation, spoke with [facility] who agreed to admit patient for diuresis and monitor.  Filed Vitals:   02/02/14 2157 02/02/14 2215 02/02/14 2230 02/02/14 2300  BP: 173/86 133/82 158/87 171/98  Pulse: 85 79 83 85  Temp:   97.7 F (36.5 C) 97.4 F (36.3 C)  TempSrc:   Oral Oral  Resp: 24 22 22 20   Height:    5' 9.5" (1.765 m)  Weight:    218 lb 11.1 oz (99.2 kg)  SpO2: 98% 92% 97% 99%       Renold Genta, PA-C 02/03/14 0052

## 2014-02-02 NOTE — Telephone Encounter (Signed)
Patient Information:  Caller Name: Ulrike  Phone: [phone]  Patient: Paula Stein, Paula Stein  Gender: Female  DOB: Feb 28, 1946  Age: 68 Years  PCP: Shawna Orleans Doe-Hyun Herbie [place]) (Adults only)  Office Follow Up:  Does the office need to follow up with this patient?: No  Instructions For The Office: N/A  RN Note:  Pt. advised to go back to the [facility] now. Pt. agrees.  Symptoms  Reason For Call & Symptoms: Went to the UC (01/29/14)for sinusitis and placed on an antibiotic. Pt. is not doing much better. BP 170/90. Pt. on Valsartan. Pt. is now having difficulty breathing.Pt. started coughing on 02/01/14. Pt. is a smoker. Having some wheezing and SOB at rest.  Reviewed Health History In EMR: Yes  Reviewed Medications In EMR: Yes  Reviewed Allergies In EMR: Yes  Reviewed Surgeries / Procedures: Yes  Date of Onset of Symptoms: 01/29/2014  Treatments Tried: Amoxil 500mg . qid.  Treatments Tried Worked: No  Guideline(s) Used:  Breathing Difficulty  Disposition Per Guideline:   Go to ED Now  Reason For Disposition Reached:   Moderate difficulty breathing (e.g., speaks in phrases, SOB even at rest, pulse 100-120) of new onset or worse than normal  Advice Given:  Call Back If:  Severe difficulty breathing occurs  You become worse.  Patient Will Follow Care Advice:  YES

## 2014-02-02 NOTE — ED Notes (Signed)
ekg completed

## 2014-02-02 NOTE — Telephone Encounter (Signed)
Pt saw Dr. Brigitte Pulse on Wednesday for sinus infection, she says that she has been having an increase of sob, and would like an inhaler. I relayed to her Dr.Copland's Schedule, and advised her that I would put in a message for her, but to consider RTC. Pt agreed and stated that she would try to make in the morning.

## 2014-02-03 DIAGNOSIS — I5043 Acute on chronic combined systolic (congestive) and diastolic (congestive) heart failure: Secondary | ICD-10-CM | POA: Diagnosis present

## 2014-02-03 LAB — COMPREHENSIVE METABOLIC PANEL
ALBUMIN: 3.5 g/dL (ref 3.5–5.2)
ALK PHOS: 81 U/L (ref 39–117)
ALT: 10 U/L (ref 0–35)
AST: 11 U/L (ref 0–37)
Anion gap: 15 (ref 5–15)
BUN: 22 mg/dL (ref 6–23)
CO2: 23 mEq/L (ref 19–32)
Calcium: 9.6 mg/dL (ref 8.4–10.5)
Chloride: 102 mEq/L (ref 96–112)
Creatinine, Ser: 1.18 mg/dL — ABNORMAL HIGH (ref 0.50–1.10)
GFR calc non Af Amer: 46 mL/min — ABNORMAL LOW (ref >90)
GFR, EST AFRICAN AMERICAN: 53 mL/min — AB (ref >90)
Glucose, Bld: 109 mg/dL — ABNORMAL HIGH (ref 70–99)
POTASSIUM: 3.9 meq/L (ref 3.7–5.3)
Sodium: 140 mEq/L (ref 137–147)
TOTAL PROTEIN: 8 g/dL (ref 6.0–8.3)
Total Bilirubin: 0.7 mg/dL (ref 0.3–1.2)

## 2014-02-03 LAB — CBC
HEMATOCRIT: 38.7 % (ref 36.0–46.0)
HEMOGLOBIN: 12.3 g/dL (ref 12.0–15.0)
MCH: 29.9 pg (ref 26.0–34.0)
MCHC: 31.8 g/dL (ref 30.0–36.0)
MCV: 93.9 fL (ref 78.0–100.0)
Platelets: 256 10*3/uL (ref 150–400)
RBC: 4.12 MIL/uL (ref 3.87–5.11)
RDW: 14.7 % (ref 11.5–15.5)
WBC: 6.1 10*3/uL (ref 4.0–10.5)

## 2014-02-03 LAB — GLUCOSE, CAPILLARY
GLUCOSE-CAPILLARY: 114 mg/dL — AB (ref 70–99)
GLUCOSE-CAPILLARY: 84 mg/dL (ref 70–99)
GLUCOSE-CAPILLARY: 96 mg/dL (ref 70–99)
GLUCOSE-CAPILLARY: 97 mg/dL (ref 70–99)

## 2014-02-03 LAB — TROPONIN I: Troponin I: <0.3 ng/mL (ref <0.30)

## 2014-02-03 LAB — PROTIME-INR
INR: 1.03 (ref 0.00–1.49)
PROTHROMBIN TIME: 13.6 s (ref 11.6–15.2)

## 2014-02-03 LAB — TSH: TSH: 49.18 u[IU]/mL — AB (ref 0.350–4.500)

## 2014-02-03 LAB — T4, FREE: Free T4: 0.86 ng/dL (ref 0.80–1.80)

## 2014-02-03 MED ORDER — TEMAZEPAM 15 MG PO CAPS
15.0000 mg | ORAL_CAPSULE | Freq: Every evening | ORAL | Status: DC | PRN
  Administered 2014-02-03: 15 mg via ORAL
  Filled 2014-02-03: qty 1

## 2014-02-03 NOTE — Plan of Care (Signed)
Problem: Phase I Progression Outcomes Goal: EF % per last Echo/documented,Core Reminder form on chart Outcome: Completed/Met Date Met:  02/03/14 EF=30-35%

## 2014-02-03 NOTE — Progress Notes (Signed)
UR completed 

## 2014-02-03 NOTE — Progress Notes (Signed)
Report given to receiving RN. Patient sitting in recliner and watching TV. No verbal complaints and no signs or symptoms of distress or discomfort.

## 2014-02-03 NOTE — ED Provider Notes (Signed)
Medical screening examination/treatment/procedure(s) were conducted as a shared visit with non-physician practitioner(s) and myself.  I personally evaluated the patient during the encounter.   EKG Interpretation   Date/Time:  Monday February 02 2014 19:42:23 EST Ventricular Rate:  93 PR Interval:  207 QRS Duration: 102 QT Interval:  372 QTC Calculation: 463 R Axis:   73 Text Interpretation:  Sinus rhythm Borderline prolonged PR interval  Probable left ventricular hypertrophy Anterior Q waves, possibly due to  LVH No significant change was found Confirmed by [facility]  MD, TREY ([id number])  on 02/03/2014 4:07:28 PM      68 yo female with hx of CHF presenting with SOB, DOE, and orthopnea.  On exam, well appearing, nontoxic, moderately increased WOB, normal perfusion, Lungs CTAB, heart sounds normal with RRR.  BNP elevated.  Lasix given.  Admitted for further management.   Clinical Impression: 1. CHF exacerbation       Artis Delay, MD 02/03/14 1609

## 2014-02-03 NOTE — Plan of Care (Signed)
Problem: Consults Goal: Heart Failure Patient Education (See Patient Education module for education specifics.) Outcome: Completed/Met Date Met:  02/03/14 Goal: Skin Care Protocol Initiated - if Braden Score 18 or less If consults are not indicated, leave blank or document N/A Outcome: Completed/Met Date Met:  02/03/14 Goal: Tobacco Cessation referral if indicated Outcome: Progressing Goal: Nutrition Consult-if indicated Outcome: Not Applicable Date Met:  50/27/74 Goal: Diabetes Guidelines if Diabetic/Glucose > 140 If diabetic or lab glucose is > 140 mg/dl - Initiate Diabetes/Hyperglycemia Guidelines & Document Interventions  Outcome: Completed/Met Date Met:  02/03/14  Problem: Phase I Progression Outcomes Goal: Dyspnea controlled at rest (HF) Outcome: Completed/Met Date Met:  02/03/14

## 2014-02-03 NOTE — Plan of Care (Signed)
Problem: Phase I Progression Outcomes Goal: Pain controlled with appropriate interventions Outcome: Not Applicable Date Met:  56/15/37 Goal: Up in chair, BRP Outcome: Completed/Met Date Met:  02/03/14 Goal: Voiding-avoid urinary catheter unless indicated Outcome: Completed/Met Date Met:  02/03/14

## 2014-02-03 NOTE — Progress Notes (Addendum)
PROGRESS NOTE  Paula Stein [id number] DOB: [birth year] DOA: 02/02/2014 PCP: [name], DO  HPI/Subjective: 68 yo female with PMH of hypertension, hypothyroidism, peripheral artery disease, and diabetes mellitus.  Presented with complaints of worsening shortness of breath over the last few weeks, which has become progressively worse over the last couple of days.  Pt also has a cough- productive with clear to yellow sputum.  Seen by urgent care who started her on oral abx.  At f/u pt was not improving and urgent care suggested she go to the ED.  Pt has a 3 year hx of CHF and has never been hospitalized for it.  Weighs herself every morning.  Fairly compliant with medication but states she occasionally skips evening dose of diuretic.  Denies any lower extremity edema.  Feels fatigued.  DOE.  Orthopnea- notes she has been sleeping upright over the last few weeks.        Shortness of breath has improved some since admission.  Not requiring O2.    Assessment/Plan: Acute on chronic combined systolic and diastolic heart failure: Likely caused by ischemic cardiomyopathy, HTN, and poor compliance with diuretics.  Some improvement of SOB since admission.  Orthopnea and DOE.  BNP elevated at 2394.  CXR shows cardiovascular congestion.  2D ECHO from 05/2012 shows EF: 30-35%, mildly dilated LV with severe LVH.  Continue Lasix IV 40mg  daily, Carvedilol 25mg , Irbesartan 150mg , Spironolactone 25mg , Hydralazine 50mg , and Imdur 60mg .  Monitor daily ins and outs as well as daily weight. Hypertension: Continue home medications.   Diabetes Mellitus, Type 2:  Chronic problem.  Good control.  Holding metformin.  SSI while inpatient.  Check A1C.        Hypothyroidism: Chronic problem.  Poor control.  TSH: 49.18.  Continue on Synthroid 278mcg.  F/u with PCP to have TSH rechecked.   Sinusitis: Improving.  Continue Amoxicillin 1,000mg  bid x 7 days.  10/29 >>11/04.      DVT Prophylaxis:  Heparin 5,000 units q8h.    Code Status: Full Family Communication: Pt is awake and alert.  Disposition Plan: Will d/c when medically appropriate.     Consultants:  None  Procedures:  None  Antibiotics:  Amoxicillin 1,000mg  po BID  Objective: Filed Vitals:   02/02/14 2215 02/02/14 2230 02/02/14 2300 02/03/14 0502  BP: 133/82 158/87 171/98 102/54  Pulse: 79 83 85 73  Temp:  97.7 F (36.5 C) 97.4 F (36.3 C) 98 F (36.7 C)  TempSrc:  Oral Oral Oral  Resp: 22 22 20 18   Height:   5' 9.5" (1.765 m)   Weight:   99.2 kg (218 lb 11.1 oz)   SpO2: 92% 97% 99% 97%    Intake/Output Summary (Last 24 hours) at 02/03/14 0932 Last data filed at 02/03/14 0837  Gross per 24 hour  Intake    600 ml  Output   2100 ml  Net  -1500 ml   Filed Weights   02/02/14 1950 02/02/14 2300  Weight: 95.255 kg (210 lb) 99.2 kg (218 lb 11.1 oz)    Exam: General: NAD, appears stated age  HEENT:  MMM Neck: Supple, no JVD, no masses  Cardiovascular: RRR, S1 S2 auscultated, no rubs, murmurs or gallops.   Respiratory: Clear to auscultation bilaterally with equal chest rise.  Few rhonchi in RLL posteriorly.   Abdomen: Soft, nontender, nondistended  Extremities: warm dry without cyanosis clubbing or edema.  Neuro: AAOx3, cranial nerves grossly intact. Strength 5/5 in upper and lower extremities  Skin: Without rashes exudates or nodules.   Psych: Normal affect and demeanor with intact judgement and insight   Data Reviewed: Basic Metabolic Panel:  Recent Labs Lab 02/02/14 2004 02/03/14 0531  NA 139 140  K 4.4 3.9  CL 103 102  CO2 23 23  GLUCOSE 100* 109*  BUN 22 22  CREATININE 1.36* 1.18*  CALCIUM 9.6 9.6   Liver Function Tests:  Recent Labs Lab 02/03/14 0531  AST 11  ALT 10  ALKPHOS 81  BILITOT 0.7  PROT 8.0  ALBUMIN 3.5   CBC:  Recent Labs Lab 02/02/14 2004 02/03/14 0531  WBC 7.4 6.1  HGB 12.2 12.3  HCT 38.8 38.7  MCV 94.6 93.9  PLT 262 256   Cardiac Enzymes:  Recent Labs Lab  02/03/14 0040 02/03/14 0531  TROPONINI <0.30 <0.30   BNP (last 3 results)  Recent Labs  02/02/14 2004  PROBNP 2394.0*   CBG:  Recent Labs Lab 02/02/14 2359 02/03/14 0623  GLUCAP 151* 114*    Studies: Dg Chest 2 View  02/02/2014   CLINICAL DATA:  Acute shortness of breath.  Difficulty lying down.  EXAM: CHEST  2 VIEW  COMPARISON:  None.  FINDINGS: The heart is enlarged. There is tortuosity of the thoracic aorta. The pulmonary hila appear normal. There is central vascular congestion without overt pulmonary edema. No pleural effusions. Streaky bibasilar atelectasis or scarring. The bony thorax is intact.  IMPRESSION: Cardiac enlargement and vascular congestion without overt pulmonary edema.  Streaky bibasilar atelectasis versus scarring.   Electronically Signed   By: Kalman Jewels M.D.   On: 02/02/2014 21:03    Scheduled Meds: . amoxicillin  1,000 mg Oral BID  . aspirin EC  81 mg Oral Daily  . atorvastatin  40 mg Oral q1800  . carvedilol  25 mg Oral BID WC  . furosemide  40 mg Intravenous Daily  . heparin  5,000 Units Subcutaneous 3 times per day  . hydrALAZINE  50 mg Oral TID  . Influenza vac split quadrivalent PF  0.5 mL Intramuscular Tomorrow-1000  . insulin aspart  0-5 Units Subcutaneous QHS  . insulin aspart  0-9 Units Subcutaneous TID WC  . irbesartan  150 mg Oral Daily  . isosorbide mononitrate  60 mg Oral Daily  . levothyroxine  250 mcg Oral QAC breakfast  . nitroGLYCERIN  0.5 inch Topical 4 times per day  . pneumococcal 23 valent vaccine  0.5 mL Intramuscular Tomorrow-1000  . sodium chloride  3 mL Intravenous Q12H  . spironolactone  25 mg Oral Daily   Continuous Infusions:   Principal Problem:   Acute on chronic combined systolic and diastolic heart failure Active Problems:   Hypothyroidism   Type 2 diabetes mellitus   Essential hypertension   Nonischemic cardiomyopathy    Adelene Idler PA-S  [facility] Pager [phone]. If 7PM-7AM, please  contact night-coverage at [email or web address], password [facility] 02/03/2014, 9:32 AM  LOS: 1 day     Addendum  I personally evaluated patient on 02/03/2014 and agree with above findings. She is a 68 year old female with a past medical history of cardiomyopathy, having her last transthoracic echocardiogram done on 12/03/2012 that showed an ejection fraction of 30-35% with diffuse hypokinesis. She was admitted to the medicine service this morning, presenting with complaints of shortness of breath progressively worse over the past several weeks.  Initial lab work showing an elevated BNP of 2394. Initial chest x-ray revealed cardiac enlargement with vascular congestion. Cardiac enzymes were negative.She was started  on Lasix 40 mg IV, reporting significant improvement this morning. Overnight had a total output of 1750 ML's, having a net negative fluid balance of 1,830. On physical examination she appeared comfortable, no acute distress, breathing comfortably on room air.

## 2014-02-04 DIAGNOSIS — E038 Other specified hypothyroidism: Secondary | ICD-10-CM

## 2014-02-04 LAB — BASIC METABOLIC PANEL
ANION GAP: 12 (ref 5–15)
BUN: 28 mg/dL — AB (ref 6–23)
CO2: 25 mEq/L (ref 19–32)
CREATININE: 1.29 mg/dL — AB (ref 0.50–1.10)
Calcium: 9.3 mg/dL (ref 8.4–10.5)
Chloride: 103 mEq/L (ref 96–112)
GFR calc non Af Amer: 41 mL/min — ABNORMAL LOW (ref >90)
GFR, EST AFRICAN AMERICAN: 47 mL/min — AB (ref >90)
Glucose, Bld: 107 mg/dL — ABNORMAL HIGH (ref 70–99)
Potassium: 4.3 mEq/L (ref 3.7–5.3)
Sodium: 140 mEq/L (ref 137–147)

## 2014-02-04 LAB — CBC
HEMATOCRIT: 38.8 % (ref 36.0–46.0)
Hemoglobin: 12.4 g/dL (ref 12.0–15.0)
MCH: 30.2 pg (ref 26.0–34.0)
MCHC: 32 g/dL (ref 30.0–36.0)
MCV: 94.4 fL (ref 78.0–100.0)
Platelets: 278 10*3/uL (ref 150–400)
RBC: 4.11 MIL/uL (ref 3.87–5.11)
RDW: 14.9 % (ref 11.5–15.5)
WBC: 6.1 10*3/uL (ref 4.0–10.5)

## 2014-02-04 LAB — GLUCOSE, CAPILLARY
Glucose-Capillary: 115 mg/dL — ABNORMAL HIGH (ref 70–99)
Glucose-Capillary: 88 mg/dL (ref 70–99)

## 2014-02-04 NOTE — Discharge Summary (Signed)
Physician Discharge Summary  Paula Stein [id number] DOB: 19-Sep-1945 DOA: 02/02/2014  PCP: [name], DO  Admit date: 02/02/2014 Discharge date: 02/04/2014  Time spent: >45 minutes  Recommendations for Outpatient Follow-up:  1. Follow up on thyroid function studies  Discharge Condition: stable Diet recommendation: heart healthy, diabetic diet  Discharge Diagnoses:  Principal Problem:   Acute on chronic combined systolic and diastolic heart failure Active Problems:   Hypothyroidism   Type 2 diabetes mellitus   Essential hypertension   Nonischemic cardiomyopathy   History of present illness:  68 yo female with PMH of hypertension, hypothyroidism, peripheral artery disease, and diabetes mellitus. Presented with complaints of worsening shortness of breath over the last few weeks, which has become progressively worse over the last couple of days. Pt also has a cough- productive with clear to yellow sputum. Seen by urgent care who started her on oral abx. At f/u pt was not improving and urgent care suggested she go to the ED. Pt has a 3 year hx of CHF and has never been hospitalized for it. Weighs herself every morning. Fairly compliant with medication but states she occasionally skips evening dose of diuretic. Denies any lower extremity edema. Feels fatigued. DOE. Orthopnea- notes she has been sleeping upright over the last few weeks.    Hospital Course:  Acute on chronic combined systolic and diastolic heart failure- possible acute viral bronchitis - Likely caused by ischemic cardiomyopathy, HTN, and  Possible acute viral bronchitis- she has been coughing up yellow sputums which she states in now becoming clear - 2D ECHO from 05/2012 shows EF: 30-35%, mildly dilated LV with severe LVH- ECHO was not repeating during this admission  - has been diuresed adequately with significant improvement in symptoms- no longer requiring O2 -  now transitioned back to oral Lasix -  continue home dose of Lasix along with Carvedilol 25mg , Valsartan 150mg , Spironolactone 25mg , Hydralazine 50mg , and Imdur 60mg .  - dry weight is 218 lb  Hypertension: Continue home medications.   Diabetes Mellitus, Type 2: Chronic problem. Good control.    Hypothyroidism: Chronic problem. Poor control. TSH: 49.18 but free T4 normal at 0.86 -  Continue on Synthroid 21mcg. F/u with PCP to have TSH rechecked.   Sinusitis: Improving. Continue Amoxicillin 1,000mg  bid x 10 days as originally planned by her PCP  Procedures:  none  Consultations:  none  Discharge Exam: Filed Weights   02/02/14 1950 02/02/14 2300 02/04/14 0434  Weight: 95.255 kg (210 lb) 99.2 kg (218 lb 11.1 oz) 99.111 kg (218 lb 8 oz)   Filed Vitals:   02/04/14 1048  BP: 144/66  Pulse: 81  Temp:   Resp:     General: AAO x 3, no distress Cardiovascular: RRR, no murmurs  Respiratory: clear to auscultation bilaterally- O2 saturation 100% on room air GI: soft, non-tender, non-distended, bowel sound positive  Discharge Instructions You were cared for by a hospitalist during your hospital stay. If you have any questions about your discharge medications or the care you received while you were in the hospital after you are discharged, you can call the unit and asked to speak with the hospitalist on call if the hospitalist that took care of you is not available. Once you are discharged, your primary care physician will handle any further medical issues. Please note that NO REFILLS for any discharge medications will be authorized once you are discharged, as it is imperative that you return to your primary care physician (or establish a relationship with  a primary care physician if you do not have one) for your aftercare needs so that they can reassess your need for medications and monitor your lab values.  Discharge Instructions    Diet - low sodium heart healthy    Complete by:  As directed   Carb modified      Increase activity slowly    Complete by:  As directed             Medication List    TAKE these medications        amoxicillin 500 MG capsule  Commonly known as:  AMOXIL  Take 2 capsules (1,000 mg total) by mouth 2 (two) times daily.     aspirin 81 MG tablet  Take 81 mg by mouth daily.     atorvastatin 40 MG tablet  Commonly known as:  LIPITOR  take 1 tablet by mouth once daily     carvedilol 25 MG tablet  Commonly known as:  COREG  Take 25 mg by mouth 2 (two) times daily with a meal.     furosemide 40 MG tablet  Commonly known as:  LASIX  Take 1 tablet (40 mg total) by mouth 3 (three) times daily.     hydrALAZINE 50 MG tablet  Commonly known as:  APRESOLINE  Take 1 tablet (50 mg total) by mouth 3 (three) times daily.     isosorbide mononitrate 30 MG 24 hr tablet  Commonly known as:  IMDUR  Take 2 tablets (60 mg total) by mouth daily.     levothyroxine 125 MCG tablet  Commonly known as:  SYNTHROID, LEVOTHROID  Take 2 tablets (250 mcg total) by mouth daily before breakfast.     metFORMIN 500 MG tablet  Commonly known as:  GLUCOPHAGE  Take 1 tablet (500 mg total) by mouth 2 (two) times daily with a meal.     ONE TOUCH TEST STRIPS test strip  Generic drug:  glucose blood  Use as instructed to check blood sugar once a day     spironolactone 25 MG tablet  Commonly known as:  ALDACTONE  Take 1 tablet (25 mg total) by mouth daily.     valsartan 160 MG tablet  Commonly known as:  DIOVAN  Take 1 tablet (160 mg total) by mouth daily.     zolpidem 5 MG tablet  Commonly known as:  AMBIEN  take 1 tablet by mouth at bedtime if needed for sleep       Allergies  Allergen Reactions  . Codeine Nausea And Vomiting      The results of significant diagnostics from this hospitalization (including imaging, microbiology, ancillary and laboratory) are listed below for reference.    Significant Diagnostic Studies: Dg Chest 2 View  02/02/2014   CLINICAL DATA:   Acute shortness of breath.  Difficulty lying down.  EXAM: CHEST  2 VIEW  COMPARISON:  None.  FINDINGS: The heart is enlarged. There is tortuosity of the thoracic aorta. The pulmonary hila appear normal. There is central vascular congestion without overt pulmonary edema. No pleural effusions. Streaky bibasilar atelectasis or scarring. The bony thorax is intact.  IMPRESSION: Cardiac enlargement and vascular congestion without overt pulmonary edema.  Streaky bibasilar atelectasis versus scarring.   Electronically Signed   By: Kalman Jewels M.D.   On: 02/02/2014 21:03    Microbiology: No results found for this or any previous visit (from the past 240 hour(s)).   Labs: Basic Metabolic Panel:  Recent Labs Lab 02/02/14 2004  02/03/14 0531 02/04/14 0414  NA 139 140 140  K 4.4 3.9 4.3  CL 103 102 103  CO2 23 23 25   GLUCOSE 100* 109* 107*  BUN 22 22 28*  CREATININE 1.36* 1.18* 1.29*  CALCIUM 9.6 9.6 9.3   Liver Function Tests:  Recent Labs Lab 02/03/14 0531  AST 11  ALT 10  ALKPHOS 81  BILITOT 0.7  PROT 8.0  ALBUMIN 3.5   No results for input(s): LIPASE, AMYLASE in the last 168 hours. No results for input(s): AMMONIA in the last 168 hours. CBC:  Recent Labs Lab 02/02/14 2004 02/03/14 0531 02/04/14 0414  WBC 7.4 6.1 6.1  HGB 12.2 12.3 12.4  HCT 38.8 38.7 38.8  MCV 94.6 93.9 94.4  PLT 262 256 278   Cardiac Enzymes:  Recent Labs Lab 02/03/14 0040 02/03/14 0531 02/03/14 1247  TROPONINI <0.30 <0.30 <0.30   BNP: BNP (last 3 results)  Recent Labs  02/02/14 2004  PROBNP 2394.0*   CBG:  Recent Labs Lab 02/03/14 0623 02/03/14 1106 02/03/14 1601 02/03/14 2110 02/04/14 0547  GLUCAP 114* 84 96 97 115*       SignedDebbe Odea, MD [facility] 02/04/2014, 11:35 AM

## 2014-02-04 NOTE — Progress Notes (Signed)
DC IV, DC Tele, DC Home. Discharge instructions and home medications discussed with patient. Patient denied any questions or concerns at this time. Patient leaving unit via wheelchair and appears in no acute distress.  

## 2014-04-01 ENCOUNTER — Other Ambulatory Visit: Payer: Self-pay | Admitting: Internal Medicine

## 2014-04-01 MED ORDER — METFORMIN HCL 500 MG PO TABS: 500.0000 mg | ORAL_TABLET | Freq: Two times a day (BID) | ORAL | Status: DC

## 2014-04-16 ENCOUNTER — Other Ambulatory Visit: Payer: Self-pay | Admitting: Internal Medicine

## 2014-05-13 ENCOUNTER — Ambulatory Visit (INDEPENDENT_AMBULATORY_CARE_PROVIDER_SITE_OTHER): Payer: Medicare PPO | Admitting: Internal Medicine

## 2014-05-13 ENCOUNTER — Encounter: Payer: Self-pay | Admitting: Internal Medicine

## 2014-05-13 VITALS — BP 142/80 | Temp 98.0°F | Wt 223.0 lb

## 2014-05-13 DIAGNOSIS — Z79899 Other long term (current) drug therapy: Secondary | ICD-10-CM | POA: Diagnosis not present

## 2014-05-13 DIAGNOSIS — E1142 Type 2 diabetes mellitus with diabetic polyneuropathy: Secondary | ICD-10-CM | POA: Diagnosis not present

## 2014-05-13 DIAGNOSIS — I10 Essential (primary) hypertension: Secondary | ICD-10-CM

## 2014-05-13 DIAGNOSIS — E785 Hyperlipidemia, unspecified: Secondary | ICD-10-CM | POA: Diagnosis not present

## 2014-05-13 DIAGNOSIS — I5022 Chronic systolic (congestive) heart failure: Secondary | ICD-10-CM | POA: Diagnosis not present

## 2014-05-13 DIAGNOSIS — I509 Heart failure, unspecified: Secondary | ICD-10-CM

## 2014-05-13 DIAGNOSIS — E038 Other specified hypothyroidism: Secondary | ICD-10-CM

## 2014-05-13 DIAGNOSIS — R0989 Other specified symptoms and signs involving the circulatory and respiratory systems: Secondary | ICD-10-CM

## 2014-05-13 LAB — BASIC METABOLIC PANEL
BUN: 26 mg/dL — ABNORMAL HIGH (ref 6–23)
CO2: 29 meq/L (ref 19–32)
Calcium: 10.2 mg/dL (ref 8.4–10.5)
Chloride: 103 mEq/L (ref 96–112)
Creatinine, Ser: 1.36 mg/dL — ABNORMAL HIGH (ref 0.40–1.20)
GFR: 49.33 mL/min — ABNORMAL LOW (ref >60.00)
Glucose, Bld: 95 mg/dL (ref 70–99)
Potassium: 3.9 mEq/L (ref 3.5–5.1)
Sodium: 138 mEq/L (ref 135–145)

## 2014-05-13 LAB — MICROALBUMIN / CREATININE URINE RATIO
Creatinine,U: 239.4 mg/dL
MICROALB UR: 36.1 mg/dL — AB (ref 0.0–1.9)
Microalb Creat Ratio: 15.1 mg/g (ref 0.0–30.0)

## 2014-05-13 LAB — HEPATIC FUNCTION PANEL
ALBUMIN: 4.4 g/dL (ref 3.5–5.2)
ALT: 11 U/L (ref 0–35)
AST: 13 U/L (ref 0–37)
Alkaline Phosphatase: 103 U/L (ref 39–117)
BILIRUBIN DIRECT: 0.1 mg/dL (ref 0.0–0.3)
Total Bilirubin: 0.5 mg/dL (ref 0.2–1.2)
Total Protein: 8.6 g/dL — ABNORMAL HIGH (ref 6.0–8.3)

## 2014-05-13 LAB — HEMOGLOBIN A1C: HEMOGLOBIN A1C: 6.9 % — AB (ref 4.6–6.5)

## 2014-05-13 LAB — LIPID PANEL
CHOLESTEROL: 272 mg/dL — AB (ref 0–200)
HDL: 60.5 mg/dL (ref >39.00)
LDL CALC: 180 mg/dL — AB (ref 0–99)
NonHDL: 211.5
TRIGLYCERIDES: 158 mg/dL — AB (ref 0.0–149.0)
Total CHOL/HDL Ratio: 4
VLDL: 31.6 mg/dL (ref 0.0–40.0)

## 2014-05-13 LAB — TSH: TSH: 3.68 u[IU]/mL (ref 0.35–4.50)

## 2014-05-13 LAB — VITAMIN B12: Vitamin B-12: 381 pg/mL (ref 211–911)

## 2014-05-13 MED ORDER — SPIRONOLACTONE 25 MG PO TABS: 25.0000 mg | ORAL_TABLET | Freq: Every day | ORAL | Status: DC

## 2014-05-13 MED ORDER — CARVEDILOL 25 MG PO TABS: 25.0000 mg | ORAL_TABLET | Freq: Two times a day (BID) | ORAL | Status: DC

## 2014-05-13 MED ORDER — FUROSEMIDE 40 MG PO TABS: 40.0000 mg | ORAL_TABLET | Freq: Three times a day (TID) | ORAL | Status: DC

## 2014-05-13 MED ORDER — VALSARTAN 160 MG PO TABS: 160.0000 mg | ORAL_TABLET | Freq: Every day | ORAL | Status: DC

## 2014-05-13 MED ORDER — METFORMIN HCL 500 MG PO TABS: 500.0000 mg | ORAL_TABLET | Freq: Two times a day (BID) | ORAL | Status: DC

## 2014-05-13 NOTE — Assessment & Plan Note (Signed)
She appears euvolemic today.  Monitor BMET.  I encouraged low salt diet and daily weights.  Restart coreg.  I stressed importance of medication compliance.

## 2014-05-13 NOTE — Progress Notes (Signed)
Subjective:    Patient ID: Paula Stein, female    DOB: 11-14-1945, 69 y.o.   MRN: [id number]  HPI  69 year old African-American female with history of hypertension, type 2 diabetes, chronic congestive heart failure for follow-up. Interval medical history, patient was admitted in November 2015 for acute on chronic combined systolic and diastolic heart failure. Patient reports her diuretic dose was increased. She has been doing a much better job of monitoring her salt intake and daily weights. Her medication compliance is generally good but she ran out of carvedilol.  Diabetes mellitus type 2-patient had difficulty tolerating metformin 500 mg 3 times a day. She also has chronic renal insufficiency. Her GFR still greater than 30.  Review of Systems Negative for chest pain,  Negative for shortness of breath    Past Medical History  Diagnosis Date  . Hypertension   . Personal history of goiter   . S/P thyroidectomy   . S/P radioactive iodine thyroid ablation   . Iatrogenic thyroiditis   . Non-ischemic cardiomyopathy     EF 28%- reassessment of LV function 2011 with LVEf 45-50%  . History of cardiac cath 05/2007    normal-with patent coronaries  . PAD (peripheral artery disease)     lower extremities with ABIs of 0.5 bilaterally  . History of colonoscopy   . Diabetes mellitus without complication   . CHF (congestive heart failure)     History   Social History  . Marital Status: Divorced    Spouse Name: N/A  . Number of Children: 1  . Years of Education: N/A   Occupational History  . Retired     Education officer, museum   Social History Main Topics  . Smoking status: Light Tobacco Smoker -- 40 years  . Smokeless tobacco: Not on file     Comment: Smokes 3 cigs week  . Alcohol Use: No  . Drug Use: No  . Sexual Activity: Not Currently   Other Topics Concern  . Not on file   Social History Narrative   Retired Education officer, museum   Divorced - one grown son   current smoker    Alcohol  use-no      Drug use-no    Regular exercise-yes        son - [name]    Past Surgical History  Procedure Laterality Date  . Total abdominal hysterectomy    . Thyroidectomy    . Tonsillectomy      Family History  Problem Relation Age of Onset  . Diabetes type II Mother   . Hypertension Mother   . Diabetes Mother   . Heart disease Mother   . Other Father     deceased from accident age 65  . Hyperlipidemia Father     Allergies  Allergen Reactions  . Codeine Nausea And Vomiting    Current Outpatient Prescriptions on File Prior to Visit  Medication Sig Dispense Refill  . aspirin 81 MG tablet Take 81 mg by mouth daily.      Marland Kitchen atorvastatin (LIPITOR) 40 MG tablet take 1 tablet by mouth once daily 90 tablet 1  . carvedilol (COREG) 25 MG tablet Take 25 mg by mouth 2 (two) times daily with a meal.    . furosemide (LASIX) 40 MG tablet Take 1 tablet (40 mg total) by mouth 3 (three) times daily. 90 tablet 1  . glucose blood (ONE TOUCH TEST STRIPS) test strip Use as instructed to check blood sugar once a day     .  hydrALAZINE (APRESOLINE) 50 MG tablet Take 1 tablet (50 mg total) by mouth 3 (three) times daily. 90 tablet 3  . isosorbide mononitrate (IMDUR) 30 MG 24 hr tablet Take 2 tablets (60 mg total) by mouth daily. 30 tablet 11  . levothyroxine (SYNTHROID, LEVOTHROID) 125 MCG tablet TAKE 2 TABLETS BY MOUTH DAILY BEFORE BREAKFAST 60 tablet 2  . metFORMIN (GLUCOPHAGE) 500 MG tablet Take 1 tablet (500 mg total) by mouth 2 (two) times daily with a meal. 180 tablet 0  . spironolactone (ALDACTONE) 25 MG tablet Take 1 tablet (25 mg total) by mouth daily. 90 tablet 1  . valsartan (DIOVAN) 160 MG tablet Take 1 tablet (160 mg total) by mouth daily. 90 tablet 1  . zolpidem (AMBIEN) 5 MG tablet take 1 tablet by mouth at bedtime if needed for sleep 30 tablet 3  . amoxicillin (AMOXIL) 500 MG capsule Take 2 capsules (1,000 mg total) by mouth 2 (two) times daily. (Patient not taking: Reported on  05/13/2014) 40 capsule 0   No current facility-administered medications on file prior to visit.    BP 142/80 mmHg  Temp(Src) 98 F (36.7 C) (Oral)  Wt 223 lb (101.152 kg)    Objective:   Physical Exam  Constitutional: She is oriented to person, place, and time. She appears well-developed and well-nourished. No distress.  HENT:  Head: Normocephalic and atraumatic.  Neck: Neck supple.  No carotid bruit  Cardiovascular: Normal rate, regular rhythm and normal heart sounds.  Exam reveals no gallop.   No murmur heard. Pulmonary/Chest: Effort normal and breath sounds normal. She has no wheezes.  Musculoskeletal: She exhibits no edema.  Lymphadenopathy:    She has no cervical adenopathy.  Neurological: She is alert and oriented to person, place, and time. No cranial nerve deficit.  Skin: Skin is warm and dry.  Psychiatric: She has a normal mood and affect. Her behavior is normal.          Assessment & Plan:

## 2014-05-13 NOTE — Assessment & Plan Note (Signed)
BP suboptimally controlled.  She ran out of Coreg.  Medication compliance encouraged. BP: (!) 142/80 mmHg

## 2014-05-13 NOTE — Patient Instructions (Signed)
It is extremely important that you completely stop smoking Our office will contact you regarding arterial Doppler of your legs

## 2014-05-13 NOTE — Assessment & Plan Note (Addendum)
Monitor A1c.   She has diminished pulses in her feet.  Obtain arterial doppler. Decrease metformin to 500mg  bid.  We may have to discontinue if her GFR falls to < 30.

## 2014-05-13 NOTE — Progress Notes (Signed)
Pre visit review using our clinic review tool, if applicable. No additional management support is needed unless otherwise documented below in the visit note. 

## 2014-05-14 ENCOUNTER — Telehealth: Payer: Self-pay | Admitting: Internal Medicine

## 2014-05-14 NOTE — Telephone Encounter (Signed)
emmi emailed °

## 2014-05-15 ENCOUNTER — Other Ambulatory Visit: Payer: Self-pay | Admitting: Internal Medicine

## 2014-05-20 ENCOUNTER — Ambulatory Visit (HOSPITAL_COMMUNITY): Payer: Medicare PPO | Attending: Internal Medicine | Admitting: *Deleted

## 2014-05-20 ENCOUNTER — Other Ambulatory Visit (HOSPITAL_COMMUNITY): Payer: Self-pay | Admitting: *Deleted

## 2014-05-20 DIAGNOSIS — Z720 Tobacco use: Secondary | ICD-10-CM

## 2014-05-20 DIAGNOSIS — F17210 Nicotine dependence, cigarettes, uncomplicated: Secondary | ICD-10-CM | POA: Diagnosis not present

## 2014-05-20 DIAGNOSIS — R252 Cramp and spasm: Secondary | ICD-10-CM | POA: Insufficient documentation

## 2014-05-20 DIAGNOSIS — R0989 Other specified symptoms and signs involving the circulatory and respiratory systems: Secondary | ICD-10-CM

## 2014-05-20 DIAGNOSIS — F17200 Nicotine dependence, unspecified, uncomplicated: Secondary | ICD-10-CM

## 2014-05-20 NOTE — Progress Notes (Signed)
Lower Extremity Arterial Doppler Limited - Performed

## 2014-06-11 ENCOUNTER — Other Ambulatory Visit: Payer: Self-pay | Admitting: Internal Medicine

## 2014-06-11 DIAGNOSIS — I739 Peripheral vascular disease, unspecified: Secondary | ICD-10-CM

## 2014-06-12 ENCOUNTER — Encounter: Payer: Self-pay | Admitting: Internal Medicine

## 2014-07-01 ENCOUNTER — Encounter: Payer: Self-pay | Admitting: Internal Medicine

## 2014-07-02 NOTE — Telephone Encounter (Signed)
No. I will look into it.

## 2014-07-02 NOTE — Telephone Encounter (Signed)
I reviewed patient's medication list and past history, nothing has ever been prescribed for smoking cessation patches.  A PA would not have been received.

## 2014-07-03 NOTE — Telephone Encounter (Signed)
I received a fax today from [facility], it will be placed in your folder.  This may be what the patient is referring to but it's not a PA request.

## 2014-07-07 MED ORDER — NICOTINE 14 MG/24HR TD PT24: 14.0000 mg | MEDICATED_PATCH | Freq: Every day | TRANSDERMAL | Status: DC

## 2014-07-07 NOTE — Addendum Note (Signed)
Addended by: Townsend Roger D on: 07/07/2014 11:07 AM   Modules accepted: Orders

## 2014-07-08 ENCOUNTER — Ambulatory Visit: Payer: Medicare PPO | Admitting: Pulmonary Disease

## 2014-07-13 ENCOUNTER — Encounter: Payer: Self-pay | Admitting: Internal Medicine

## 2014-07-13 ENCOUNTER — Ambulatory Visit (INDEPENDENT_AMBULATORY_CARE_PROVIDER_SITE_OTHER): Payer: Medicare PPO | Admitting: Internal Medicine

## 2014-07-13 VITALS — BP 158/90 | HR 66 | Temp 98.0°F | Ht 70.0 in | Wt 228.0 lb

## 2014-07-13 DIAGNOSIS — I509 Heart failure, unspecified: Secondary | ICD-10-CM | POA: Diagnosis not present

## 2014-07-13 DIAGNOSIS — E039 Hypothyroidism, unspecified: Secondary | ICD-10-CM

## 2014-07-13 DIAGNOSIS — I10 Essential (primary) hypertension: Secondary | ICD-10-CM | POA: Diagnosis not present

## 2014-07-13 DIAGNOSIS — I70219 Atherosclerosis of native arteries of extremities with intermittent claudication, unspecified extremity: Secondary | ICD-10-CM | POA: Diagnosis not present

## 2014-07-13 MED ORDER — NICOTINE 14 MG/24HR TD PT24: 14.0000 mg | MEDICATED_PATCH | Freq: Every day | TRANSDERMAL | Status: DC

## 2014-07-13 MED ORDER — SPIRONOLACTONE 25 MG PO TABS: 25.0000 mg | ORAL_TABLET | Freq: Every day | ORAL | Status: DC

## 2014-07-13 MED ORDER — ISOSORBIDE MONONITRATE ER 30 MG PO TB24: 60.0000 mg | ORAL_TABLET | Freq: Every day | ORAL | Status: DC

## 2014-07-13 MED ORDER — FUROSEMIDE 40 MG PO TABS: 40.0000 mg | ORAL_TABLET | Freq: Three times a day (TID) | ORAL | Status: DC

## 2014-07-13 MED ORDER — HYDRALAZINE HCL 50 MG PO TABS: 50.0000 mg | ORAL_TABLET | Freq: Three times a day (TID) | ORAL | Status: DC

## 2014-07-13 MED ORDER — VALSARTAN 160 MG PO TABS: 160.0000 mg | ORAL_TABLET | Freq: Every day | ORAL | Status: DC

## 2014-07-13 NOTE — Progress Notes (Signed)
Subjective:    Patient ID: Paula Stein, female    DOB: September 10, 1945, 69 y.o.   MRN: [id number]  HPI  69 year old African American female for follow-up regarding hypertension, hyperlipidemia and peripheral vascular disease. Patient completed lower extremity ABI.  She has follow up appointment with vascular specialist.  She has restarted taking atorvastatin on a regular basis.  She is working on smoking cessation.  Hypertension - she restart most of her medications but not all.  Medication reconciliation performed by nursing staff.   Review of Systems Negative for chest pain, negative for shortness of breath    Past Medical History  Diagnosis Date  . Hypertension   . Personal history of goiter   . S/P thyroidectomy   . S/P radioactive iodine thyroid ablation   . Iatrogenic thyroiditis   . Non-ischemic cardiomyopathy     EF 28%- reassessment of LV function 2011 with LVEf 45-50%  . History of cardiac cath 05/2007    normal-with patent coronaries  . PAD (peripheral artery disease)     lower extremities with ABIs of 0.5 bilaterally  . History of colonoscopy   . Diabetes mellitus without complication   . CHF (congestive heart failure)     History   Social History  . Marital Status: Divorced    Spouse Name: N/A  . Number of Children: 1  . Years of Education: N/A   Occupational History  . Retired     Education officer, museum   Social History Main Topics  . Smoking status: Light Tobacco Smoker -- 40 years  . Smokeless tobacco: Not on file     Comment: Smokes 3 cigs week  . Alcohol Use: No  . Drug Use: No  . Sexual Activity: Not Currently   Other Topics Concern  . Not on file   Social History Narrative   Retired Education officer, museum   Divorced - one grown son   current smoker    Alcohol use-no      Drug use-no    Regular exercise-yes        son - Dajsha Turk    Past Surgical History  Procedure Laterality Date  . Total abdominal hysterectomy    . Thyroidectomy    .  Tonsillectomy      Family History  Problem Relation Age of Onset  . Diabetes type II Mother   . Hypertension Mother   . Diabetes Mother   . Heart disease Mother   . Other Father     deceased from accident age 19  . Hyperlipidemia Father     Allergies  Allergen Reactions  . Codeine Nausea And Vomiting    Current Outpatient Prescriptions on File Prior to Visit  Medication Sig Dispense Refill  . aspirin 81 MG tablet Take 81 mg by mouth daily.      Marland Kitchen atorvastatin (LIPITOR) 40 MG tablet take 1 tablet by mouth once daily 90 tablet 1  . carvedilol (COREG) 25 MG tablet Take 1 tablet (25 mg total) by mouth 2 (two) times daily with a meal. 180 tablet 1  . furosemide (LASIX) 40 MG tablet Take 1 tablet (40 mg total) by mouth 3 (three) times daily. 90 tablet 1  . glucose blood (ONE TOUCH TEST STRIPS) test strip Use as instructed to check blood sugar once a day     . hydrALAZINE (APRESOLINE) 50 MG tablet Take 1 tablet (50 mg total) by mouth 3 (three) times daily. 90 tablet 3  . isosorbide mononitrate (IMDUR) 30 MG  24 hr tablet Take 2 tablets (60 mg total) by mouth daily. 30 tablet 11  . levothyroxine (SYNTHROID, LEVOTHROID) 125 MCG tablet TAKE 2 TABLETS BY MOUTH DAILY BEFORE BREAKFAST 60 tablet 2  . metFORMIN (GLUCOPHAGE) 500 MG tablet Take 1 tablet (500 mg total) by mouth 2 (two) times daily with a meal. 180 tablet 1  . nicotine (NICODERM CQ - DOSED IN MG/24 HOURS) 14 mg/24hr patch Place 1 patch (14 mg total) onto the skin daily. 28 patch 0  . spironolactone (ALDACTONE) 25 MG tablet Take 1 tablet (25 mg total) by mouth daily. 90 tablet 1  . valsartan (DIOVAN) 160 MG tablet Take 1 tablet (160 mg total) by mouth daily. 90 tablet 1  . zolpidem (AMBIEN) 5 MG tablet take 1 tablet by mouth at bedtime if needed for sleep 30 tablet 3   No current facility-administered medications on file prior to visit.    BP 158/90 mmHg  Pulse 66  Temp(Src) 98 F (36.7 C) (Oral)  Ht 5\' 10"  (1.778 m)  Wt 228 lb  (103.42 kg)  BMI 32.71 kg/m2    Objective:   Physical Exam  Constitutional: She is oriented to person, place, and time. She appears well-developed and well-nourished. No distress.  HENT:  Head: Normocephalic and atraumatic.  Cardiovascular: Normal rate, regular rhythm and normal heart sounds.   No murmur heard. Pulmonary/Chest: Effort normal and breath sounds normal. She has no wheezes.  Musculoskeletal: She exhibits no edema.  Neurological: She is alert and oriented to person, place, and time.  Skin: Skin is warm and dry.          Assessment & Plan:

## 2014-07-13 NOTE — Assessment & Plan Note (Signed)
Stable.  Continue same dose of levothyroxine. Lab Results  Component Value Date   TSH 3.68 05/13/2014

## 2014-07-13 NOTE — Addendum Note (Signed)
Addended by: Townsend Roger D on: 07/13/2014 08:21 AM   Modules accepted: Orders

## 2014-07-13 NOTE — Progress Notes (Signed)
Pre visit review using our clinic review tool, if applicable. No additional management support is needed unless otherwise documented below in the visit note. 

## 2014-07-13 NOTE — Assessment & Plan Note (Signed)
BP still not at goal.  Refilled all of her BP medications.  Reassess in 2 months. Lab Results  Component Value Date   NA 138 05/13/2014   K 3.9 05/13/2014   CL 103 05/13/2014   CO2 29 05/13/2014   Lab Results  Component Value Date   CREATININE 1.36* 05/13/2014

## 2014-07-15 ENCOUNTER — Encounter: Payer: Self-pay | Admitting: Cardiovascular Disease

## 2014-07-15 ENCOUNTER — Ambulatory Visit (INDEPENDENT_AMBULATORY_CARE_PROVIDER_SITE_OTHER): Payer: Medicare PPO | Admitting: Cardiovascular Disease

## 2014-07-15 DIAGNOSIS — I739 Peripheral vascular disease, unspecified: Secondary | ICD-10-CM | POA: Diagnosis not present

## 2014-07-15 DIAGNOSIS — E785 Hyperlipidemia, unspecified: Secondary | ICD-10-CM

## 2014-07-15 DIAGNOSIS — I5022 Chronic systolic (congestive) heart failure: Secondary | ICD-10-CM | POA: Diagnosis not present

## 2014-07-15 DIAGNOSIS — I10 Essential (primary) hypertension: Secondary | ICD-10-CM

## 2014-07-15 MED ORDER — SPIRONOLACTONE 50 MG PO TABS: 50.0000 mg | ORAL_TABLET | Freq: Every day | ORAL | Status: DC

## 2014-07-15 MED ORDER — HYDRALAZINE HCL 50 MG PO TABS: 75.0000 mg | ORAL_TABLET | Freq: Three times a day (TID) | ORAL | Status: DC

## 2014-07-15 NOTE — Progress Notes (Signed)
Cardiology Office Note   Date:  07/15/2014   ID:  Paula Stein, DOB 09-19-1945, MRN [id number]  PCP:  [name], DO  Cardiologist:  [name], MD    Chief Complaint  Patient presents with  . Congestive Heart Failure     History of Present Illness: Paula Stein is a 69 y.o. female who presents for follow-up evaluation. The patient has a history of nonischemic cardiomyopathy and systolic heart failure. I have not seen her since 2014.  In the interval she has been seen by the advanced heart failure clinic and also has seen Dr. [name] for consideration of an ICD. She declined this and has continued with medical therapy. She has been noted to have lower extremity peripheral arterial disease and has been a long-term smoker. 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  She feels pretty well. She's walking sporadically for exercise. Does not feel limited by shortness of breath, but admits to DOE with moderate level activities. No orthopnea or PND. No edema. Denies claudication sx's, but has some hip discomfort with walking. She is enrolled in the [facility] 'Quit Smoking' program and is due to receive nicotine patches this week. She has cut down to 5 cigarettes/day.   Past Medical History  Diagnosis Date  . Hypertension   . Personal history of goiter   . S/P thyroidectomy   . S/P radioactive iodine thyroid ablation   . Iatrogenic thyroiditis   . Non-ischemic cardiomyopathy     EF 28%- reassessment of LV function 2011 with LVEf 45-50%  . History of cardiac cath 05/2007    normal-with patent coronaries  . PAD (peripheral artery disease)     lower extremities with ABIs of 0.5 bilaterally  . History of colonoscopy   . Diabetes mellitus without complication   . CHF (congestive heart failure)     Past Surgical History  Procedure Laterality Date  . Total abdominal hysterectomy    . Thyroidectomy    .  Tonsillectomy      Current Outpatient Prescriptions  Medication Sig Dispense Refill  . aspirin 81 MG tablet Take 81 mg by mouth daily.      . ASSURE COMFORT LANCETS 30G MISC 1 each by Other route daily.    Marland Kitchen atorvastatin (LIPITOR) 40 MG tablet take 1 tablet by mouth once daily 90 tablet 1  . carvedilol (COREG) 25 MG tablet Take 1 tablet (25 mg total) by mouth 2 (two) times daily with a meal. 180 tablet 1  . furosemide (LASIX) 40 MG tablet Take 1 tablet (40 mg total) by mouth 3 (three) times daily. 90 tablet 1  . glucose blood (ONE TOUCH TEST STRIPS) test strip Use as instructed to check blood sugar once a day     . hydrALAZINE (APRESOLINE) 50 MG tablet Take 1.5 tablets (75 mg total) by mouth 3 (three) times daily. 135 tablet 11  . isosorbide mononitrate (IMDUR) 30 MG 24 hr tablet Take 2 tablets (60 mg total) by mouth daily. 180 tablet 1  . levothyroxine (SYNTHROID, LEVOTHROID) 125 MCG tablet TAKE 2 TABLETS BY MOUTH DAILY BEFORE BREAKFAST 60 tablet 2  . metFORMIN (GLUCOPHAGE) 500 MG tablet Take 1 tablet (500 mg total) by mouth 2 (two) times daily with a meal. 180 tablet 1  . nicotine (NICODERM CQ - DOSED IN MG/24 HOURS) 14 mg/24hr patch Place 1 patch (14 mg total) onto the skin daily. 28 patch 0  . spironolactone (ALDACTONE)  50 MG tablet Take 1 tablet (50 mg total) by mouth daily. 30 tablet 11  . valsartan (DIOVAN) 160 MG tablet Take 1 tablet (160 mg total) by mouth daily. 90 tablet 1   No current facility-administered medications for this visit.    Allergies:   Codeine   Social History:  The patient  reports that she has been smoking.  She does not have any smokeless tobacco history on file. She reports that she does not drink alcohol or use illicit drugs.   Family History:  The patient's family history includes Diabetes in her mother; Diabetes type II in her mother; Heart disease in her mother; Hyperlipidemia in her father; Hypertension in her mother; Other in her father.    ROS:   Please see the history of present illness.  Otherwise, review of systems is positive for muscle pain, leg pain..  All other systems are reviewed and negative.    PHYSICAL EXAM: VS:  BP 168/90 mmHg  Pulse 68  Ht 5\' 10"  (1.778 m)  Wt 226 lb 6.4 oz (102.694 kg)  BMI 32.48 kg/m2 , BMI Body mass index is 32.48 kg/(m^2). GEN: Well nourished, well developed, in no acute distress HEENT: normal Neck: no JVD, no masses. No carotid bruits Cardiac: RRR without murmur or gallop                Respiratory:  clear to auscultation bilaterally, normal work of breathing GI: soft, nontender, nondistended, + BS MS: no deformity or atrophy Ext: no pretibial edema, pedal pulses nonpalpable Skin: warm and dry, no rash Neuro:  Strength and sensation are intact Psych: euthymic mood, full affect  EKG:  EKG is ordered today. The ekg ordered today shows Sinus Rhythms 68 bpm, LVH, nonspecific ST abnormality  Recent Labs: 02/02/2014: Pro B Natriuretic peptide (BNP) 2394.0* 02/04/2014: Hemoglobin 12.4; Platelets 278 05/13/2014: ALT 11; BUN 26*; Creatinine 1.36*; Potassium 3.9; Sodium 138; TSH 3.68   Lipid Panel     Component Value Date/Time   CHOL 272* 05/13/2014 1434   TRIG 158.0* 05/13/2014 1434   HDL 60.50 05/13/2014 1434   CHOLHDL 4 05/13/2014 1434   VLDL 31.6 05/13/2014 1434   LDLCALC 180* 05/13/2014 1434   LDLDIRECT 195.2 02/15/2011 1625      Wt Readings from Last 3 Encounters:  07/15/14 226 lb 6.4 oz (102.694 kg)  07/13/14 228 lb (103.42 kg)  05/13/14 223 lb (101.152 kg)     Cardiac Studies Reviewed: 2D Echo 12/03/2012: Study Conclusions  - Left ventricle: The cavity size was mildly dilated. Wall thickness was increased in a pattern of severe LVH. Systolic function was moderately to severely reduced. The estimated ejection fraction was in the range of 30% to 35%. Diffuse hypokinesis. - Mitral valve: Moderate regurgitation. - Left atrium: The atrium was mildly to moderately  dilated. - Pulmonary arteries: Systolic pressure was mildly increased. PA peak pressure: 70mm Hg (S).  ASSESSMENT AND PLAN: 1.  Chronic systolic Heart Failure, NYHA Functional Class 2. The patient has had good improvement in her functional status since I saw her last in 2004 18. She is compliant with her medical program. She has no clear evidence of volume overload on physical exam. She appears to have class II symptoms at this time.  Sees Dr [name] 5/9 for scheduled EP follow-up. Need to update echo prior to that visit - will order to assess LV function and MR.   2. Essential hypertension, uncontrolled. The patient avoids salt. She reports compliance with her medications. She  has only taken valsartan this morning. I am going to increase hydralazine to 75 mg 3 times daily and increase spironolactone to 50 mg daily. Will arrange a metabolic panel when she comes in for her echo in a few weeks.  3. Lower extremity peripheral arterial disease: Does not appear to be experiencing claudication symptoms at this time. Will repeat ABIs.  4. Tobacco abuse: Cessation counseling done. She understands the importance of quitting in the setting of her cardiovascular disease.  5. Hyperlipidemia. The patient is on atorvastatin. Most recent lipids reviewed.   Current medicines are reviewed with the patient today.  The patient does not have concerns regarding medicines.  The following changes have been made:    Increase hydralazine to 75 mg 3 times daily  Increase Spirinolactone to 50 mg daily  Labs/ tests ordered today include:   Orders Placed This Encounter  Procedures  . 2D Echocardiogram with contrast  . Ankle brachial index   Disposition:   FU 6 months with PA/NP, one year with me.   Signed, [name], MD  07/15/2014 1:54 PM    [facility] [address], [place], [place]  [address] Phone: [phone]; Fax: [phone]

## 2014-07-15 NOTE — Patient Instructions (Addendum)
Medication Instructions:  Your physician has recommended you make the following change in your medication:  1. INCREASE Hydralazine to 50mg  take one and one-half tablet by mouth three times a day 2. INCREASE Spironolactone to 50mg  take one tablet by mouth daily  Labwork: Your physician recommends that you return for lab work in: 1-2 WEEKS (BMP)  Testing/Procedures: Your physician has requested that you have an echocardiogram in 1-2 WEEKS. Echocardiography is a painless test that uses sound waves to create images of your heart. It provides your doctor with information about the size and shape of your heart and how well your heart's chambers and valves are working. This procedure takes approximately one hour. There are no restrictions for this procedure.  Your physician has requested that you have an ankle brachial index (ABI) in 1-2 WEEKS. During this test an ultrasound and blood pressure cuff are used to evaluate the arteries that supply the arms and legs with blood. Allow thirty minutes for this exam. There are no restrictions or special instructions.   Follow-Up: Your physician wants you to follow-up in: 6 MONTHS with PA/NP. You will receive a reminder letter in the mail two months in advance. If you don't receive a letter, please call our office to schedule the follow-up appointment.  Your physician wants you to follow-up in: 1 YEAR with Dr Burt Knack.  You will receive a reminder letter in the mail two months in advance. If you don't receive a letter, please call our office to schedule the follow-up appointment.  Any Other Special Instructions Will Be Listed Below (If Applicable).

## 2014-07-22 ENCOUNTER — Ambulatory Visit (HOSPITAL_COMMUNITY): Payer: Medicare PPO | Attending: Cardiovascular Disease | Admitting: Cardiology

## 2014-07-22 ENCOUNTER — Other Ambulatory Visit (INDEPENDENT_AMBULATORY_CARE_PROVIDER_SITE_OTHER): Payer: Medicare PPO | Admitting: *Deleted

## 2014-07-22 ENCOUNTER — Ambulatory Visit (HOSPITAL_COMMUNITY): Payer: Medicare PPO

## 2014-07-22 DIAGNOSIS — I10 Essential (primary) hypertension: Secondary | ICD-10-CM

## 2014-07-22 DIAGNOSIS — E785 Hyperlipidemia, unspecified: Secondary | ICD-10-CM | POA: Diagnosis not present

## 2014-07-22 DIAGNOSIS — I5022 Chronic systolic (congestive) heart failure: Secondary | ICD-10-CM

## 2014-07-22 DIAGNOSIS — I739 Peripheral vascular disease, unspecified: Secondary | ICD-10-CM | POA: Diagnosis not present

## 2014-07-22 DIAGNOSIS — I509 Heart failure, unspecified: Secondary | ICD-10-CM | POA: Diagnosis present

## 2014-07-22 LAB — BASIC METABOLIC PANEL
BUN: 20 mg/dL (ref 6–23)
CO2: 28 mEq/L (ref 19–32)
Calcium: 10.1 mg/dL (ref 8.4–10.5)
Chloride: 103 mEq/L (ref 96–112)
Creatinine, Ser: 1.14 mg/dL (ref 0.40–1.20)
GFR: 60.43 mL/min (ref >60.00)
GLUCOSE: 137 mg/dL — AB (ref 70–99)
Potassium: 3.6 mEq/L (ref 3.5–5.1)
Sodium: 139 mEq/L (ref 135–145)

## 2014-07-22 NOTE — Addendum Note (Signed)
Addended by: Eulis Foster on: 07/22/2014 07:49 AM   Modules accepted: Orders

## 2014-07-22 NOTE — Progress Notes (Signed)
Echo performed. 

## 2014-07-29 ENCOUNTER — Telehealth: Payer: Self-pay

## 2014-07-29 NOTE — Telephone Encounter (Signed)
Echo interpretation per Dr Burt Knack:  Echo reviewed. Patient has persistent severe LV systolic dysfunction with LVEF 30-35%. She should keep her scheduled appointment with Dr. [name] next month. Continue her current medical program. She has only mild regurgitation.    The pt also had lab work and this was okay.    Left message on machine for pt to contact the office.

## 2014-07-31 NOTE — Telephone Encounter (Signed)
Pt aware of lab and Echo results by phone. The pt is currently not scheduled to see Dr [name]. I have sent a message to the EP scheduler to arrange an appointment.

## 2014-08-10 ENCOUNTER — Other Ambulatory Visit: Payer: Self-pay | Admitting: Internal Medicine

## 2014-08-10 ENCOUNTER — Ambulatory Visit: Payer: Medicare PPO | Admitting: Pulmonary Disease

## 2014-08-10 DIAGNOSIS — Z1231 Encounter for screening mammogram for malignant neoplasm of breast: Secondary | ICD-10-CM

## 2014-08-20 ENCOUNTER — Ambulatory Visit (HOSPITAL_COMMUNITY)
Admission: RE | Admit: 2014-08-20 | Discharge: 2014-08-20 | Disposition: A | Discharge status: Home / Self Care | Payer: Medicare PPO | Source: Ambulatory Visit | Attending: Internal Medicine | Admitting: Internal Medicine

## 2014-08-20 DIAGNOSIS — Z1231 Encounter for screening mammogram for malignant neoplasm of breast: Secondary | ICD-10-CM | POA: Diagnosis present

## 2014-08-21 ENCOUNTER — Ambulatory Visit (INDEPENDENT_AMBULATORY_CARE_PROVIDER_SITE_OTHER): Payer: Medicare PPO | Admitting: Internal Medicine

## 2014-08-21 ENCOUNTER — Encounter: Payer: Self-pay | Admitting: Internal Medicine

## 2014-08-21 VITALS — BP 140/60 | HR 81 | Ht 70.0 in | Wt 229.0 lb

## 2014-08-21 DIAGNOSIS — R9430 Abnormal result of cardiovascular function study, unspecified: Secondary | ICD-10-CM

## 2014-08-21 DIAGNOSIS — I739 Peripheral vascular disease, unspecified: Secondary | ICD-10-CM

## 2014-08-21 DIAGNOSIS — I10 Essential (primary) hypertension: Secondary | ICD-10-CM

## 2014-08-21 DIAGNOSIS — I5022 Chronic systolic (congestive) heart failure: Secondary | ICD-10-CM | POA: Diagnosis not present

## 2014-08-21 DIAGNOSIS — E785 Hyperlipidemia, unspecified: Secondary | ICD-10-CM

## 2014-08-21 MED ORDER — HYDRALAZINE HCL 100 MG PO TABS: 100.0000 mg | ORAL_TABLET | Freq: Three times a day (TID) | ORAL | Status: DC

## 2014-08-21 MED ORDER — FUROSEMIDE 40 MG PO TABS: 40.0000 mg | ORAL_TABLET | Freq: Two times a day (BID) | ORAL | Status: DC

## 2014-08-21 NOTE — Patient Instructions (Signed)
Medication Instructions:  Your physician has recommended you make the following change in your medication:  1) DECREASE Furosemide to 40 mg twice daily 2) INCREASE Hydralazine to 100 mg three times daily  Labwork: None ordered  Testing/Procedures: Your physician has recommended that you have a defibrillator inserted. An implantable cardioverter defibrillator (ICD) is a small device that is placed in your chest or, in rare cases, your abdomen. This device uses electrical pulses or shocks to help control life-threatening, irregular heartbeats that could lead the heart to suddenly stop beating (sudden cardiac arrest). Leads are attached to the ICD that goes into your heart. This is done in the hospital and usually requires an overnight stay.  We will be in touch with you to schedule you to come in for consideration of SICD.  Follow-Up: To be determined after procedure scheduling  Any Other Special Instructions Will Be Listed Below (If Applicable).

## 2014-08-21 NOTE — Progress Notes (Signed)
Electrophysiology Office Note   Date:  08/21/2014   ID:  Paula Stein, DOB 04-10-1945, MRN [id number]  PCP:  [name], DO  Cardiologist:  [facility] Primary Electrophysiologist:    Virl Axe, MD    No chief complaint on file.    History of Present Illness: Paula Stein is a 69 y.o. female    Seen originally June 2014 for consideration of ICD implantation for nonischemic cardiac myopathy with depressed left ventricular function. At that point she recently been started on ARB is in Aldactone. We added nitrates to her hydralazine.  Repeat echocardiogram 4/16 confirmed  Ejection fractions in the 30-35% range with moderate MR. She also severe LVH and left atrial dilatation  She has stable class 2-3 symptoms  Today, she denies symptoms of  chest pain, shortness of breath, orthopnea, PND, lower extremity edema, bleeding, or neurologic sequela.  she has no * complaints of palpitations, lightheadedness presyncope or syncope  The patient is tolerating medications without difficulties and is otherwise without complaint today.   She notes her blood pressures elevated at home  Past Medical History  Diagnosis Date  . Hypertension   . Personal history of goiter   . S/P thyroidectomy   . S/P radioactive iodine thyroid ablation   . Iatrogenic thyroiditis   . Non-ischemic cardiomyopathy     EF 28%- reassessment of LV function 2011 with LVEf 45-50%  . History of cardiac cath 05/2007    normal-with patent coronaries  . PAD (peripheral artery disease)     lower extremities with ABIs of 0.5 bilaterally  . History of colonoscopy   . Diabetes mellitus without complication   . CHF (congestive heart failure)    Past Surgical History  Procedure Laterality Date  . Total abdominal hysterectomy    . Thyroidectomy    . Tonsillectomy       Current Outpatient Prescriptions  Medication Sig Dispense Refill  . aspirin 81 MG tablet Take 81 mg by mouth daily.      . ASSURE COMFORT LANCETS 30G MISC 1  each by Other route daily.    Marland Kitchen atorvastatin (LIPITOR) 40 MG tablet take 1 tablet by mouth once daily 90 tablet 1  . carvedilol (COREG) 25 MG tablet Take 1 tablet (25 mg total) by mouth 2 (two) times daily with a meal. 180 tablet 1  . furosemide (LASIX) 40 MG tablet Take 1 tablet (40 mg total) by mouth 3 (three) times daily. 90 tablet 1  . glucose blood (ONE TOUCH TEST STRIPS) test strip Use as instructed to check blood sugar once a day     . hydrALAZINE (APRESOLINE) 50 MG tablet Take 1.5 tablets (75 mg total) by mouth 3 (three) times daily. 135 tablet 11  . isosorbide mononitrate (IMDUR) 30 MG 24 hr tablet Take 2 tablets (60 mg total) by mouth daily. 180 tablet 1  . levothyroxine (SYNTHROID, LEVOTHROID) 125 MCG tablet TAKE 2 TABLETS BY MOUTH DAILY BEFORE BREAKFAST 60 tablet 2  . metFORMIN (GLUCOPHAGE) 500 MG tablet Take 1 tablet (500 mg total) by mouth 2 (two) times daily with a meal. 180 tablet 1  . nicotine (NICODERM CQ - DOSED IN MG/24 HOURS) 14 mg/24hr patch Place 1 patch (14 mg total) onto the skin daily. 28 patch 0  . spironolactone (ALDACTONE) 50 MG tablet Take 1 tablet (50 mg total) by mouth daily. 30 tablet 11  . valsartan (DIOVAN) 160 MG tablet Take 1 tablet (160 mg total) by mouth daily. 90 tablet 1  No current facility-administered medications for this visit.    Allergies:   Codeine   Social History:  The patient  reports that she has been smoking.  She does not have any smokeless tobacco history on file. She reports that she does not drink alcohol or use illicit drugs.   Family History:  The patient's family history includes Diabetes in her mother; Diabetes type II in her mother; Heart disease in her mother; Hyperlipidemia in her father; Hypertension in her mother; Other in her father.    ROS:  Please see the history of present illness.   Otherwise, review of systems is negative except for right foot pain.    PHYSICAL EXAM: VS:  BP 140/60 mmHg  Pulse 81  Ht 5\' 10"  (1.778 m)   Wt 229 lb (103.874 kg)  BMI 32.86 kg/m2 , BMI Body mass index is 32.86 kg/(m^2). GEN: Well nourished, well developed, in no acute distress HEENT: normal Neck: JVD flat, carotid bruits, or masses Cardiac: R RR; 2/6  murmur  rubs, noS4  Respiratory:  clear to auscultation bilaterally, normal work of breathing Back without kyphosis or CVAT GI: soft, nontender, nondistended, + BS MS: no deformity or atrophy Skin: warm and dry,   Extremities No Clubbing cyanosis  Edema Neuro:  Strength and sensation are intact Psych: euthymic mood, fand isn't cardioverted thenll affect  EKG:  EKG is not ordered today. The ekg ordered today shows  NSR 68 23/09/40   Recent Labs: 02/02/2014: Pro B Natriuretic peptide (BNP) 2394.0* 02/04/2014: Hemoglobin 12.4; Platelets 278 05/13/2014: ALT 11; TSH 3.68 07/22/2014: BUN 20; Creatinine 1.14; Potassium 3.6; Sodium 139    Lipid Panel     Component Value Date/Time   CHOL 272* 05/13/2014 1434   TRIG 158.0* 05/13/2014 1434   HDL 60.50 05/13/2014 1434   CHOLHDL 4 05/13/2014 1434   VLDL 31.6 05/13/2014 1434   LDLCALC 180* 05/13/2014 1434   LDLDIRECT 195.2 02/15/2011 1625     Wt Readings from Last 3 Encounters:  08/21/14 229 lb (103.874 kg)  07/15/14 226 lb 6.4 oz (102.694 kg)  07/13/14 228 lb (103.42 kg)      Other studies Reviewed: Additional studies/ records that were reviewed today include: echo  Review of the above records today demonstrates: As above    ASSESSMENT AND PLAN:  Hypertension  Nonischemic cardiomyopathy  Congestive heart failure-chronic-systolic    Current medicines are reviewed at length with the patient today.   The patient does not have concerns regarding her medicines.  The following changes were made today:  Increased her hydralazine to try to decrease blood pressure 75--100 mg 3 times a day   We have discussed the relative benefits and merits of transvenous versus subcutaneous ICD implantation.  The advantages of  the former including battery longevity, history derived from use in randomized controlled trials and perhaps a somewhat lower rate of inappropriate ICD discharges. Advantages of the latter  include the fact that it is extravascular,  Resulting different implications of device infection and it being non-transvalvular.    She would prefer to pursue S ICD. I will discuss with colleagues the role of S ICD in the context of this lady with very large left atrium who is at significant risk for atrial fibrillation. at  Labs/ tests ordered today include: Napping for subcutaneous ICD No orders of the defined types were placed in this encounter.     Disposition:   FU with me post op    Signed, Virl Axe, MD  08/21/2014 9:52 AM     [facility] [address] [place] [address] [phone] (office) [phone] (fax)

## 2014-08-24 ENCOUNTER — Encounter: Payer: Self-pay | Admitting: Internal Medicine

## 2014-08-30 ENCOUNTER — Encounter: Payer: Self-pay | Admitting: Pulmonary Disease

## 2014-09-21 ENCOUNTER — Telehealth: Payer: Self-pay | Admitting: Internal Medicine

## 2014-09-21 ENCOUNTER — Encounter: Payer: Self-pay | Admitting: *Deleted

## 2014-09-21 NOTE — Telephone Encounter (Signed)
Scheduled SICD for 7/7. Pre procedure labs 6/30. Wound check 7/18. Letter of instructions reviewed with patient and left at front desk for pick up. Patient verbalized understanding and agreeable to plan.

## 2014-09-21 NOTE — Telephone Encounter (Signed)
Follow Up ° °Pt returning call from earlier. Please call. °

## 2014-09-24 NOTE — Addendum Note (Signed)
Addended by: Leanord Asal T on: 09/24/2014 03:52 PM   Modules accepted: Orders

## 2014-10-01 ENCOUNTER — Other Ambulatory Visit (INDEPENDENT_AMBULATORY_CARE_PROVIDER_SITE_OTHER): Payer: Medicare PPO | Admitting: *Deleted

## 2014-10-01 DIAGNOSIS — I5033 Acute on chronic diastolic (congestive) heart failure: Secondary | ICD-10-CM

## 2014-10-01 DIAGNOSIS — I5043 Acute on chronic combined systolic (congestive) and diastolic (congestive) heart failure: Secondary | ICD-10-CM

## 2014-10-01 DIAGNOSIS — I5022 Chronic systolic (congestive) heart failure: Secondary | ICD-10-CM

## 2014-10-01 LAB — PROTIME-INR
INR: 0.9 ratio (ref 0.8–1.0)
PROTHROMBIN TIME: 10.3 s (ref 9.6–13.1)

## 2014-10-01 LAB — BASIC METABOLIC PANEL
BUN: 18 mg/dL (ref 6–23)
CO2: 27 mEq/L (ref 19–32)
CREATININE: 1.24 mg/dL — AB (ref 0.40–1.20)
Calcium: 9.3 mg/dL (ref 8.4–10.5)
Chloride: 108 mEq/L (ref 96–112)
GFR: 54.81 mL/min — ABNORMAL LOW (ref >60.00)
Glucose, Bld: 130 mg/dL — ABNORMAL HIGH (ref 70–99)
Potassium: 4 mEq/L (ref 3.5–5.1)
Sodium: 141 mEq/L (ref 135–145)

## 2014-10-01 LAB — CBC WITH DIFFERENTIAL/PLATELET
Basophils Absolute: 0 10*3/uL (ref 0.0–0.1)
Basophils Relative: 0.4 % (ref 0.0–3.0)
Eosinophils Absolute: 0.1 10*3/uL (ref 0.0–0.7)
Eosinophils Relative: 1 % (ref 0.0–5.0)
HEMATOCRIT: 37.4 % (ref 36.0–46.0)
Hemoglobin: 12.2 g/dL (ref 12.0–15.0)
LYMPHS ABS: 2.6 10*3/uL (ref 0.7–4.0)
LYMPHS PCT: 35.4 % (ref 12.0–46.0)
MCHC: 32.5 g/dL (ref 30.0–36.0)
MCV: 92.6 fl (ref 78.0–100.0)
MONOS PCT: 7.6 % (ref 3.0–12.0)
Monocytes Absolute: 0.6 10*3/uL (ref 0.1–1.0)
NEUTROS ABS: 4.1 10*3/uL (ref 1.4–7.7)
Neutrophils Relative %: 55.6 % (ref 43.0–77.0)
PLATELETS: 261 10*3/uL (ref 150.0–400.0)
RBC: 4.04 Mil/uL (ref 3.87–5.11)
RDW: 15.4 % (ref 11.5–15.5)
WBC: 7.4 10*3/uL (ref 4.0–10.5)

## 2014-10-01 NOTE — Addendum Note (Signed)
Addended by: Eulis Foster on: 10/01/2014 08:21 AM   Modules accepted: Orders

## 2014-10-01 NOTE — Addendum Note (Signed)
Addended by: Eulis Foster on: 10/01/2014 08:20 AM   Modules accepted: Orders

## 2014-10-07 MED ORDER — CEFAZOLIN SODIUM-DEXTROSE 2-3 GM-% IV SOLR
2.0000 g | INTRAVENOUS | Status: AC
  Administered 2014-10-08: 2 g via INTRAVENOUS

## 2014-10-07 MED ORDER — SODIUM CHLORIDE 0.9 % IR SOLN
80.0000 mg | Status: AC
  Administered 2014-10-08: 80 mg
  Filled 2014-10-07: qty 2

## 2014-10-07 NOTE — Anesthesia Preprocedure Evaluation (Addendum)
Anesthesia Evaluation  Patient identified by MRN, date of birth, ID band Patient awake    Reviewed: Allergy & Precautions, H&P , NPO status , Patient's Chart, lab work & pertinent test results, reviewed documented beta blocker date and time   Airway Mallampati: I       Dental  (+) Dental Advidsory Given, Edentulous Upper, Partial Lower   Pulmonary sleep apnea , Current Smoker,  breath sounds clear to auscultation        Cardiovascular hypertension, Pt. on medications + Peripheral Vascular Disease and +CHF Rhythm:Regular  ECHO 2016 EF 30-35%, Non ischemic cardiomyopathy, MR, LVH, Dilated LV and L atrium   Neuro/Psych    GI/Hepatic Neg liver ROS, GERD-  ,  Endo/Other  diabetes, Well Controlled, Type 2, Oral Hypoglycemic AgentsHypothyroidism   Renal/GU creat 1.3     Musculoskeletal   Abdominal (+)  Abdomen: soft.    Peds  Hematology 12/37   Anesthesia Other Findings   Reproductive/Obstetrics                          Anesthesia Physical Anesthesia Plan  ASA: III  Anesthesia Plan: MAC   Post-op Pain Management:    Induction: Intravenous  Airway Management Planned: Nasal Cannula  Additional Equipment:   Intra-op Plan:   Post-operative Plan:   Informed Consent: I have reviewed the patients History and Physical, chart, labs and discussed the procedure including the risks, benefits and alternatives for the proposed anesthesia with the patient or authorized representative who has indicated his/her understanding and acceptance.   Dental Advisory Given  Plan Discussed with:   Anesthesia Plan Comments: (Cardiologist requesting  MAC)       Anesthesia Quick Evaluation

## 2014-10-08 ENCOUNTER — Ambulatory Visit (HOSPITAL_COMMUNITY): Payer: Medicare PPO | Admitting: Anesthesiology

## 2014-10-08 ENCOUNTER — Encounter (HOSPITAL_COMMUNITY): Payer: Self-pay | Admitting: Internal Medicine

## 2014-10-08 ENCOUNTER — Encounter (HOSPITAL_COMMUNITY)
Admission: RE | Disposition: A | Discharge status: Home / Self Care | Payer: Self-pay | Source: Ambulatory Visit | Attending: Internal Medicine

## 2014-10-08 ENCOUNTER — Ambulatory Visit (HOSPITAL_COMMUNITY)
Admission: RE | Admit: 2014-10-08 | Discharge: 2014-10-09 | Disposition: A | Discharge status: Home / Self Care | Payer: Medicare PPO | Source: Ambulatory Visit | Attending: Internal Medicine | Admitting: Internal Medicine

## 2014-10-08 DIAGNOSIS — E064 Drug-induced thyroiditis: Secondary | ICD-10-CM | POA: Diagnosis not present

## 2014-10-08 DIAGNOSIS — E119 Type 2 diabetes mellitus without complications: Secondary | ICD-10-CM | POA: Insufficient documentation

## 2014-10-08 DIAGNOSIS — Z79899 Other long term (current) drug therapy: Secondary | ICD-10-CM | POA: Diagnosis not present

## 2014-10-08 DIAGNOSIS — G4733 Obstructive sleep apnea (adult) (pediatric): Secondary | ICD-10-CM | POA: Insufficient documentation

## 2014-10-08 DIAGNOSIS — Z959 Presence of cardiac and vascular implant and graft, unspecified: Secondary | ICD-10-CM

## 2014-10-08 DIAGNOSIS — I10 Essential (primary) hypertension: Secondary | ICD-10-CM | POA: Diagnosis not present

## 2014-10-08 DIAGNOSIS — I429 Cardiomyopathy, unspecified: Secondary | ICD-10-CM

## 2014-10-08 DIAGNOSIS — Z7982 Long term (current) use of aspirin: Secondary | ICD-10-CM | POA: Insufficient documentation

## 2014-10-08 DIAGNOSIS — I472 Ventricular tachycardia: Secondary | ICD-10-CM | POA: Diagnosis not present

## 2014-10-08 DIAGNOSIS — F17200 Nicotine dependence, unspecified, uncomplicated: Secondary | ICD-10-CM | POA: Insufficient documentation

## 2014-10-08 DIAGNOSIS — I739 Peripheral vascular disease, unspecified: Secondary | ICD-10-CM | POA: Diagnosis not present

## 2014-10-08 DIAGNOSIS — I5022 Chronic systolic (congestive) heart failure: Secondary | ICD-10-CM | POA: Diagnosis not present

## 2014-10-08 DIAGNOSIS — I428 Other cardiomyopathies: Secondary | ICD-10-CM

## 2014-10-08 DIAGNOSIS — Z95810 Presence of automatic (implantable) cardiac defibrillator: Secondary | ICD-10-CM

## 2014-10-08 DIAGNOSIS — K219 Gastro-esophageal reflux disease without esophagitis: Secondary | ICD-10-CM | POA: Diagnosis not present

## 2014-10-08 HISTORY — DX: Reserved for inherently not codable concepts without codable children: IMO0001

## 2014-10-08 HISTORY — DX: Presence of automatic (implantable) cardiac defibrillator: Z95.810

## 2014-10-08 HISTORY — DX: Personal history of other diseases of the digestive system: Z87.19

## 2014-10-08 HISTORY — PX: EP IMPLANTABLE DEVICE: SHX172B

## 2014-10-08 LAB — SURGICAL PCR SCREEN
MRSA, PCR: NEGATIVE
Staphylococcus aureus: NEGATIVE

## 2014-10-08 LAB — GLUCOSE, CAPILLARY
GLUCOSE-CAPILLARY: 161 mg/dL — AB (ref 65–99)
GLUCOSE-CAPILLARY: 126 mg/dL — AB (ref 65–99)
Glucose-Capillary: 133 mg/dL — ABNORMAL HIGH (ref 65–99)
Glucose-Capillary: 141 mg/dL — ABNORMAL HIGH (ref 65–99)
Glucose-Capillary: 121 mg/dL — ABNORMAL HIGH (ref 65–99)

## 2014-10-08 SURGERY — SUBQ ICD IMPLANT
Anesthesia: Monitor Anesthesia Care

## 2014-10-08 MED ORDER — SODIUM CHLORIDE 0.9 % IV SOLN
INTRAVENOUS | Status: AC
  Administered 2014-10-08: 11:00:00 via INTRAVENOUS

## 2014-10-08 MED ORDER — MEPERIDINE HCL 25 MG/ML IJ SOLN: 6.2500 mg | INTRAMUSCULAR | Status: DC | PRN

## 2014-10-08 MED ORDER — BUPIVACAINE HCL (PF) 0.25 % IJ SOLN
INTRAMUSCULAR | Status: AC
  Filled 2014-10-08: qty 30

## 2014-10-08 MED ORDER — GLYCOPYRROLATE 0.2 MG/ML IJ SOLN
INTRAMUSCULAR | Status: DC | PRN
  Administered 2014-10-08: .6 mg via INTRAVENOUS

## 2014-10-08 MED ORDER — LACTATED RINGERS IV SOLN
INTRAVENOUS | Status: DC | PRN
  Administered 2014-10-08: 08:00:00 via INTRAVENOUS

## 2014-10-08 MED ORDER — ATORVASTATIN CALCIUM 40 MG PO TABS
40.0000 mg | ORAL_TABLET | Freq: Every day | ORAL | Status: DC
  Administered 2014-10-08: 40 mg via ORAL
  Filled 2014-10-08: qty 1

## 2014-10-08 MED ORDER — HEPARIN (PORCINE) IN NACL 2-0.9 UNIT/ML-% IJ SOLN
INTRAMUSCULAR | Status: DC | PRN
  Administered 2014-10-08: 09:00:00

## 2014-10-08 MED ORDER — CEFAZOLIN SODIUM-DEXTROSE 2-3 GM-% IV SOLR
INTRAVENOUS | Status: AC
  Filled 2014-10-08: qty 50

## 2014-10-08 MED ORDER — SPIRONOLACTONE 25 MG PO TABS
50.0000 mg | ORAL_TABLET | Freq: Every day | ORAL | Status: DC
  Administered 2014-10-08 – 2014-10-09 (×2): 50 mg via ORAL
  Filled 2014-10-08 (×2): qty 2

## 2014-10-08 MED ORDER — FUROSEMIDE 40 MG PO TABS
40.0000 mg | ORAL_TABLET | Freq: Two times a day (BID) | ORAL | Status: DC
  Administered 2014-10-08 – 2014-10-09 (×2): 40 mg via ORAL
  Filled 2014-10-08 (×2): qty 1

## 2014-10-08 MED ORDER — CHLORHEXIDINE GLUCONATE 4 % EX LIQD
60.0000 mL | Freq: Once | CUTANEOUS | Status: DC
  Filled 2014-10-08: qty 60

## 2014-10-08 MED ORDER — MUPIROCIN 2 % EX OINT
TOPICAL_OINTMENT | CUTANEOUS | Status: AC
  Filled 2014-10-08: qty 22

## 2014-10-08 MED ORDER — FENTANYL CITRATE (PF) 100 MCG/2ML IJ SOLN
INTRAMUSCULAR | Status: DC | PRN
Start: 2014-10-08 — End: 2014-10-08
  Administered 2014-10-08 (×2): 50 ug via INTRAVENOUS
  Administered 2014-10-08: 100 ug via INTRAVENOUS
  Administered 2014-10-08: 50 ug via INTRAVENOUS

## 2014-10-08 MED ORDER — ACETAMINOPHEN 325 MG PO TABS
325.0000 mg | ORAL_TABLET | ORAL | Status: DC | PRN
  Administered 2014-10-08: 650 mg via ORAL
  Filled 2014-10-08: qty 2

## 2014-10-08 MED ORDER — IRBESARTAN 150 MG PO TABS
150.0000 mg | ORAL_TABLET | Freq: Two times a day (BID) | ORAL | Status: DC
  Administered 2014-10-08 – 2014-10-09 (×3): 150 mg via ORAL
  Filled 2014-10-08 (×3): qty 1

## 2014-10-08 MED ORDER — MUPIROCIN 2 % EX OINT
1.0000 "application " | TOPICAL_OINTMENT | Freq: Once | CUTANEOUS | Status: AC
  Administered 2014-10-08: 1 via TOPICAL
  Filled 2014-10-08: qty 22

## 2014-10-08 MED ORDER — SUCCINYLCHOLINE CHLORIDE 20 MG/ML IJ SOLN
INTRAMUSCULAR | Status: DC | PRN
  Administered 2014-10-08: 120 mg via INTRAVENOUS

## 2014-10-08 MED ORDER — AMLODIPINE BESYLATE 5 MG PO TABS
5.0000 mg | ORAL_TABLET | Freq: Every day | ORAL | Status: DC
  Administered 2014-10-08: 5 mg via ORAL
  Filled 2014-10-08 (×2): qty 1

## 2014-10-08 MED ORDER — PHENYLEPHRINE HCL 10 MG/ML IJ SOLN
INTRAMUSCULAR | Status: DC | PRN
  Administered 2014-10-08: 80 ug via INTRAVENOUS
  Administered 2014-10-08: 40 ug via INTRAVENOUS

## 2014-10-08 MED ORDER — ONDANSETRON HCL 4 MG/2ML IJ SOLN: 4.0000 mg | Freq: Four times a day (QID) | INTRAMUSCULAR | Status: DC | PRN

## 2014-10-08 MED ORDER — SODIUM CHLORIDE 0.9 % IV SOLN
INTRAVENOUS | Status: DC
  Administered 2014-10-08: 06:00:00 via INTRAVENOUS

## 2014-10-08 MED ORDER — LIDOCAINE HCL (CARDIAC) 20 MG/ML IV SOLN
INTRAVENOUS | Status: DC | PRN
  Administered 2014-10-08: 100 mg via INTRAVENOUS

## 2014-10-08 MED ORDER — CEFAZOLIN SODIUM 1-5 GM-% IV SOLN
1.0000 g | Freq: Four times a day (QID) | INTRAVENOUS | Status: AC
  Administered 2014-10-08 – 2014-10-09 (×3): 1 g via INTRAVENOUS
  Filled 2014-10-08 (×3): qty 50

## 2014-10-08 MED ORDER — PROPOFOL 10 MG/ML IV BOLUS
INTRAVENOUS | Status: DC | PRN
  Administered 2014-10-08: 125 mg via INTRAVENOUS
  Administered 2014-10-08: 75 mg via INTRAVENOUS

## 2014-10-08 MED ORDER — MIDAZOLAM HCL 5 MG/5ML IJ SOLN
INTRAMUSCULAR | Status: DC | PRN
  Administered 2014-10-08: 2 mg via INTRAVENOUS

## 2014-10-08 MED ORDER — BUPIVACAINE HCL (PF) 0.25 % IJ SOLN
INTRAMUSCULAR | Status: DC | PRN
  Administered 2014-10-08: 31 mL

## 2014-10-08 MED ORDER — ROCURONIUM BROMIDE 100 MG/10ML IV SOLN
INTRAVENOUS | Status: DC | PRN
  Administered 2014-10-08: 30 mg via INTRAVENOUS

## 2014-10-08 MED ORDER — ISOSORBIDE MONONITRATE ER 60 MG PO TB24
60.0000 mg | ORAL_TABLET | Freq: Every day | ORAL | Status: DC
  Administered 2014-10-08 – 2014-10-09 (×2): 60 mg via ORAL
  Filled 2014-10-08 (×2): qty 1

## 2014-10-08 MED ORDER — ASPIRIN 81 MG PO TABS: 81.0000 mg | ORAL_TABLET | Freq: Every day | ORAL | Status: DC

## 2014-10-08 MED ORDER — BUPIVACAINE HCL (PF) 0.25 % IJ SOLN
INTRAMUSCULAR | Status: AC
  Filled 2014-10-08: qty 90

## 2014-10-08 MED ORDER — OXYCODONE-ACETAMINOPHEN 5-325 MG PO TABS
1.0000 | ORAL_TABLET | Freq: Four times a day (QID) | ORAL | Status: DC | PRN
  Administered 2014-10-08 – 2014-10-09 (×2): 1 via ORAL
  Filled 2014-10-08 (×2): qty 1

## 2014-10-08 MED ORDER — ONDANSETRON HCL 4 MG/2ML IJ SOLN
INTRAMUSCULAR | Status: DC | PRN
  Administered 2014-10-08: 4 mg via INTRAVENOUS

## 2014-10-08 MED ORDER — HYDRALAZINE HCL 50 MG PO TABS
100.0000 mg | ORAL_TABLET | Freq: Three times a day (TID) | ORAL | Status: DC
  Administered 2014-10-08 – 2014-10-09 (×3): 100 mg via ORAL
  Filled 2014-10-08 (×3): qty 2

## 2014-10-08 MED ORDER — LEVOTHYROXINE SODIUM 25 MCG PO TABS
125.0000 ug | ORAL_TABLET | Freq: Every day | ORAL | Status: DC
  Administered 2014-10-09: 125 ug via ORAL
  Filled 2014-10-08 (×2): qty 1

## 2014-10-08 MED ORDER — PROMETHAZINE HCL 25 MG/ML IJ SOLN: 6.2500 mg | INTRAMUSCULAR | Status: DC | PRN

## 2014-10-08 MED ORDER — NEOSTIGMINE METHYLSULFATE 10 MG/10ML IV SOLN
INTRAVENOUS | Status: DC | PRN
  Administered 2014-10-08: 3 mg via INTRAVENOUS

## 2014-10-08 MED ORDER — CARVEDILOL 25 MG PO TABS
25.0000 mg | ORAL_TABLET | Freq: Two times a day (BID) | ORAL | Status: DC
  Administered 2014-10-08 – 2014-10-09 (×2): 25 mg via ORAL
  Filled 2014-10-08 (×2): qty 1

## 2014-10-08 MED ORDER — METFORMIN HCL 500 MG PO TABS
500.0000 mg | ORAL_TABLET | Freq: Two times a day (BID) | ORAL | Status: DC
  Administered 2014-10-08 – 2014-10-09 (×2): 500 mg via ORAL
  Filled 2014-10-08 (×2): qty 1

## 2014-10-08 MED ORDER — ASPIRIN EC 81 MG PO TBEC
81.0000 mg | DELAYED_RELEASE_TABLET | Freq: Every day | ORAL | Status: DC
  Administered 2014-10-08 – 2014-10-09 (×2): 81 mg via ORAL
  Filled 2014-10-08 (×2): qty 1

## 2014-10-08 SURGICAL SUPPLY — 5 items
HEMOSTAT SURGICEL 2X4 FIBR (HEMOSTASIS) ×2 IMPLANT
ICD SUBQ EMBLEM S-ICD 3401 (Lead) ×2 IMPLANT
ICD SUBQ EMBLEM S-PULSE A209 (ICD Generator) ×2 IMPLANT
PAD DEFIB LIFELINK (PAD) ×2 IMPLANT
TRAY PACEMAKER INSERTION (CUSTOM PROCEDURE TRAY) ×2 IMPLANT

## 2014-10-08 NOTE — Anesthesia Procedure Notes (Signed)
Procedure Name: Intubation Date/Time: 10/08/2014 7:54 AM Performed by: Neldon Newport Pre-anesthesia Checklist: Patient being monitored, Suction available, Emergency Drugs available, Patient identified and Timeout performed Patient Re-evaluated:Patient Re-evaluated prior to inductionOxygen Delivery Method: Circle system utilized Preoxygenation: Pre-oxygenation with 100% oxygen Intubation Type: IV induction and Rapid sequence Ventilation: Mask ventilation without difficulty Laryngoscope Size: Mac and 3 Grade View: Grade I Tube type: Oral Tube size: 7.0 mm Number of attempts: 1 Placement Confirmation: positive ETCO2,  ETT inserted through vocal cords under direct vision and breath sounds checked- equal and bilateral Secured at: 22 cm Tube secured with: Tape Dental Injury: Teeth and Oropharynx as per pre-operative assessment

## 2014-10-08 NOTE — H&P (Signed)
Patient Care Team: Doe-Hyun Kyra Searles, DO as PCP - General   HPI  Paula Stein is a 69 y.o. female Admitted for ICD implantation for nonischemic cardiac myopathy with depressed left ventricular function. At that point she recently been started on ARB is in Aldactone. We added nitrates to her hydralazine. BP remains 150-60  Repeat echocardiogram 4/16 confirmed Ejection fractions in the 30-35% range with moderate MR. She also severe LVH and left atrial dilatation (42/2.2)  She has stable class 2-3 symptoms  No orthonpnea or edema; no palpitations  NO fever  Past Medical History  Diagnosis Date  . Hypertension   . Personal history of goiter   . S/P thyroidectomy   . S/P radioactive iodine thyroid ablation   . Iatrogenic thyroiditis   . Non-ischemic cardiomyopathy     EF 28%- reassessment of LV function 2011 with LVEf 45-50%  . History of cardiac cath 05/2007    normal-with patent coronaries  . PAD (peripheral artery disease)     lower extremities with ABIs of 0.5 bilaterally  . History of colonoscopy   . Diabetes mellitus without complication   . CHF (congestive heart failure)     Past Surgical History  Procedure Laterality Date  . Total abdominal hysterectomy    . Thyroidectomy    . Tonsillectomy      Current Facility-Administered Medications  Medication Dose Route Frequency Provider Last Rate Last Dose  . 0.9 %  sodium chloride infusion   Intravenous Continuous Patsey Berthold, NP 50 mL/hr at 10/08/14 [phone]    . ceFAZolin (ANCEF) IVPB 2 g/50 mL premix  2 g Intravenous On Call Amber Sena Slate, NP      . chlorhexidine (HIBICLENS) 4 % liquid 4 application  60 mL Topical Once Amber Sena Slate, NP      . gentamicin (GARAMYCIN) 80 mg in sodium chloride irrigation 0.9 % 500 mL irrigation  80 mg Irrigation On Call Romona Curls, RPH      . mupirocin ointment (BACTROBAN) 2 %             Allergies  Allergen Reactions  . Codeine Nausea And Vomiting     Medication List    ASK your doctor about these medications        aspirin 81 MG tablet  Take 81 mg by mouth daily.     ASSURE COMFORT LANCETS 30G Misc  1 each by Other route daily.     atorvastatin 40 MG tablet  Commonly known as:  LIPITOR  take 1 tablet by mouth once daily     carvedilol 25 MG tablet  Commonly known as:  COREG  Take 1 tablet (25 mg total) by mouth 2 (two) times daily with a meal.     furosemide 40 MG tablet  Commonly known as:  LASIX  Take 1 tablet (40 mg total) by mouth 2 (two) times daily.     hydrALAZINE 100 MG tablet  Commonly known as:  APRESOLINE  Take 1 tablet (100 mg total) by mouth 3 (three) times daily.     isosorbide mononitrate 30 MG 24 hr tablet  Commonly known as:  IMDUR  Take 2 tablets (60 mg total) by mouth daily.     levothyroxine 125 MCG tablet  Commonly known as:  SYNTHROID, LEVOTHROID  TAKE 2 TABLETS BY MOUTH DAILY BEFORE BREAKFAST     metFORMIN 500 MG tablet  Commonly known as:  GLUCOPHAGE  Take 1 tablet (500 mg  total) by mouth 2 (two) times daily with a meal.     ONE TOUCH TEST STRIPS test strip  Generic drug:  glucose blood  Use as instructed to check blood sugar once a day     spironolactone 50 MG tablet  Commonly known as:  ALDACTONE  Take 1 tablet (50 mg total) by mouth daily.     valsartan 160 MG tablet  Commonly known as:  DIOVAN  Take 1 tablet (160 mg total) by mouth daily.         Review of Systems negative except from HPI and PMH  Physical Exam BP 168/65 mmHg  Pulse 78  Temp(Src) 97.6 F (36.4 C)  Resp 18  SpO2 98% Well developed and well nourished in no acute distress HENT normal E scleral and icterus clear Neck Supple JVP flat; carotids brisk and full Clear to ausculation  Regular rate and rhythm, no murmurs gallops or rub Soft with active bowel sounds No clubbing cyanosis  Edema Alert and oriented, grossly normal motor and sensory function Skin Warm and Dry  ecg  NSR 73 23/09/43 LVH  Assessment and   Plan  NICM  CHF chronic systolic  Hypertension poorly controlled  MR  moderate   Stable    Have reviewed the potential benefits and risks of ICD implantation including but not limited to death, perforation of heart or lung, lead dislodgement, infection,  device malfunction and inappropriate shocks.  The patient express understanding  and are willing to proceed.

## 2014-10-08 NOTE — Interval H&P Note (Signed)
ICD Criteria  Current LVEF:30% ;Obtained > or = 1 month ago and < or = 3 months ago.  NYHA Functional Classification: Class II  Heart Failure History:  Yes, Duration of heart failure since onset is > 9 months  Non-Ischemic Dilated Cardiomyopathy History:  Yes, timeframe is > 9 months  Atrial Fibrillation/Atrial Flutter:  No.  Ventricular Tachycardia History:  No.  Cardiac Arrest History:  No  History of Syndromes with Risk of Sudden Death:  No.  Previous ICD:  No.  Electrophysiology Study: No.  Prior MI: No.  PPM: No.  OSA:  Yes  Patient Life Expectancy of >=1 year: Yes.  Anticoagulation Therapy:  Patient is NOT on anticoagulation therapy.   Beta Blocker Therapy:  Yes.   Ace Inhibitor/ARB Therapy:  Yes.History and Physical Interval Note:  10/08/2014 7:45 AM  Paula Stein  has presented today for surgery, with the diagnosis of chronic systolic heart failure  The various methods of treatment have been discussed with the patient and family. After consideration of risks, benefits and other options for treatment, the patient has consented to  Procedure(s): SubQ ICD Implant (N/A) as a surgical intervention .  The patient's history has been reviewed, patient examined, no change in status, stable for surgery.  I have reviewed the patient's chart and labs.  Questions were answered to the patient's satisfaction.     Virl Axe

## 2014-10-08 NOTE — Transfer of Care (Signed)
Immediate Anesthesia Transfer of Care Note  Patient: Paula Stein  Procedure(s) Performed: Procedure(s): SubQ ICD Implant (N/A)  Patient Location: Cath Lab  Anesthesia Type:General  Level of Consciousness: awake, alert  and oriented  Airway & Oxygen Therapy: Patient Spontanous Breathing and Patient connected to nasal cannula oxygen  Post-op Assessment: Report given to RN, Post -op Vital signs reviewed and stable and Patient moving all extremities X 4  Post vital signs: Reviewed and stable  Last Vitals:  Filed Vitals:   10/08/14 0613  BP: 168/65  Pulse: 78  Temp: 36.4 C  Resp: 18    Complications: No apparent anesthesia complications

## 2014-10-08 NOTE — Anesthesia Postprocedure Evaluation (Signed)
Anesthesia Post Note  Patient: Paula Stein  Procedure(s) Performed: Procedure(s) (LRB): SubQ ICD Implant (N/A)  Anesthesia type: General  Patient location: PACU  Post pain: Pain level controlled  Post assessment: Post-op Vital signs reviewed  Last Vitals: BP 162/63 mmHg  Pulse 74  Temp(Src) 36.4 C  Resp 15  SpO2 94%  Post vital signs: Reviewed  Level of consciousness: sedated  Complications: No apparent anesthesia complications

## 2014-10-09 ENCOUNTER — Ambulatory Visit (HOSPITAL_COMMUNITY): Payer: Medicare PPO

## 2014-10-09 ENCOUNTER — Encounter (HOSPITAL_COMMUNITY): Payer: Self-pay | Admitting: General Practice

## 2014-10-09 DIAGNOSIS — I10 Essential (primary) hypertension: Secondary | ICD-10-CM | POA: Diagnosis not present

## 2014-10-09 DIAGNOSIS — I429 Cardiomyopathy, unspecified: Secondary | ICD-10-CM | POA: Diagnosis not present

## 2014-10-09 DIAGNOSIS — J9811 Atelectasis: Secondary | ICD-10-CM | POA: Diagnosis not present

## 2014-10-09 DIAGNOSIS — I5022 Chronic systolic (congestive) heart failure: Secondary | ICD-10-CM | POA: Diagnosis not present

## 2014-10-09 DIAGNOSIS — I472 Ventricular tachycardia: Secondary | ICD-10-CM | POA: Diagnosis not present

## 2014-10-09 DIAGNOSIS — K219 Gastro-esophageal reflux disease without esophagitis: Secondary | ICD-10-CM | POA: Diagnosis not present

## 2014-10-09 DIAGNOSIS — I739 Peripheral vascular disease, unspecified: Secondary | ICD-10-CM | POA: Diagnosis not present

## 2014-10-09 DIAGNOSIS — E119 Type 2 diabetes mellitus without complications: Secondary | ICD-10-CM | POA: Diagnosis not present

## 2014-10-09 DIAGNOSIS — Z4502 Encounter for adjustment and management of automatic implantable cardiac defibrillator: Secondary | ICD-10-CM | POA: Diagnosis not present

## 2014-10-09 DIAGNOSIS — G4733 Obstructive sleep apnea (adult) (pediatric): Secondary | ICD-10-CM | POA: Diagnosis not present

## 2014-10-09 DIAGNOSIS — E064 Drug-induced thyroiditis: Secondary | ICD-10-CM | POA: Diagnosis not present

## 2014-10-09 LAB — GLUCOSE, CAPILLARY: Glucose-Capillary: 172 mg/dL — ABNORMAL HIGH (ref 65–99)

## 2014-10-09 MED ORDER — MAGNESIUM HYDROXIDE 400 MG/5ML PO SUSP
30.0000 mL | Freq: Every day | ORAL | Status: DC | PRN
  Filled 2014-10-09: qty 30

## 2014-10-09 MED ORDER — IRBESARTAN 150 MG PO TABS: 150.0000 mg | ORAL_TABLET | Freq: Two times a day (BID) | ORAL | Status: DC

## 2014-10-09 MED ORDER — OXYCODONE-ACETAMINOPHEN 10-325 MG PO TABS: 1.0000 | ORAL_TABLET | ORAL | Status: DC | PRN

## 2014-10-09 MED ORDER — LOPERAMIDE HCL 2 MG PO CAPS: 2.0000 mg | ORAL_CAPSULE | ORAL | Status: DC | PRN

## 2014-10-09 MED ORDER — OXYCODONE HCL 5 MG PO TABS
5.0000 mg | ORAL_TABLET | Freq: Once | ORAL | Status: AC
  Administered 2014-10-09: 5 mg via ORAL
  Filled 2014-10-09: qty 1

## 2014-10-09 MED ORDER — ALUM & MAG HYDROXIDE-SIMETH 200-200-20 MG/5ML PO SUSP
30.0000 mL | ORAL | Status: DC | PRN
  Administered 2014-10-09: 30 mL via ORAL
  Filled 2014-10-09: qty 30

## 2014-10-09 NOTE — Discharge Instructions (Signed)
° ° °  Supplemental Discharge Instructions for  Pacemaker/Defibrillator Patients  Activity NO DRIVING for   1 week  ; you may begin driving on   [id number]  .  WOUND CARE - Keep the wound area clean and dry.  Do not get this area wet for one week.  you may shower on   10/11/14  . - The tape/steri-strips on your wound will fall off; do not pull them off.  No bandage is needed on the site.  DO  NOT apply any creams, oils, or ointments to the wound area. - If you notice any drainage or discharge from the wound, any swelling or bruising at the site, or you develop a fever > 101? F after you are discharged home, call the office at once.  Special Instructions - You are still able to use cellular telephones; use the ear opposite the side where you have your pacemaker/defibrillator.  Avoid carrying your cellular phone near your device. - When traveling through airports, show security personnel your identification card to avoid being screened in the metal detectors.  Ask the security personnel to use the hand wand. - Avoid arc welding equipment, MRI testing (magnetic resonance imaging), TENS units (transcutaneous nerve stimulators).  Call the office for questions about other devices. - Avoid electrical appliances that are in poor condition or are not properly grounded. - Microwave ovens are safe to be near or to operate.  Additional information for defibrillator patients should your device go off: - If your device goes off ONCE and you feel fine afterward, notify the device clinic nurses. - If your device goes off ONCE and you do not feel well afterward, call 911. - If your device goes off TWICE, call 911. - If your device goes off THREE times in one day, call 911.  DO NOT DRIVE YOURSELF OR A FAMILY MEMBER WITH A DEFIBRILLATOR TO THE HOSPITAL--CALL 911.

## 2014-10-09 NOTE — Discharge Summary (Signed)
ELECTROPHYSIOLOGY PROCEDURE DISCHARGE SUMMARY    Patient ID: Paula Stein,  MRN: [id number], DOB/AGE: October 27, 1945 69 y.o.  Admit date: 10/08/2014 Discharge date: 10/09/2014  Primary Care Physician: [name], DO Primary Cardiologist: Burt Knack Electrophysiologist: [name]  Primary Discharge Diagnosis:  Non ischemic cardiomyopathy status post ICD implantation this admission  Secondary Discharge Diagnosis:  1.  Hypertension 2.  Iatrogenic thyroiditis s/p iodine ablation 3.  PAD 4.  Diabetes  Allergies  Allergen Reactions  . Codeine Nausea And Vomiting  . Ibuprofen Nausea Only     Procedures This Admission:  1.  Implantation of a BSX S-ICD on 10/08/14 by Dr [name].  See op note for full details.   DFT's were successful at 70 J.  There were no immediate post procedure complications. 2.  CXR on 10/09/14 demonstrated no pneumothorax status post device implantation.   Brief HPI: Paula Stein is a 69 y.o. female was referred to electrophysiology in the outpatient setting for consideration of ICD implantation.  Past medical history includes non ischemic cardiomyopathy, diabetes, and hypertension.  The patient has persistent LV dysfunction despite guideline directed therapy.  Risks, benefits, and alternatives to ICD implantation were reviewed with the patient who wished to proceed.   Hospital Course:  The patient was admitted and underwent implantation of a BSX S-ICD with details as outlined above. She was monitored on telemetry overnight which demonstrated sinus rhythm with short run NSVT.  Left axillae was without hematoma or ecchymosis.  The device was interrogated and found to be functioning normally.  CXR was obtained and demonstrated no pneumothorax status post device implantation.  Wound care, arm mobility, and restrictions were reviewed with the patient.  The patient was examined and considered stable for discharge to home.   The patient's discharge medications include an ARB  Jonni Sanger) and beta blocker (Carvedilol).   Physical Exam: Filed Vitals:   10/08/14 1152 10/08/14 1207 10/08/14 1520 10/08/14 2000  BP: 156/59 162/63 132/63 109/77  Pulse: 68 74  85  Temp:    98 F (36.7 C)  TempSrc:    Oral  Resp: 13 15  22   SpO2: 94% 94%  96%    GEN- The patient is morbidly obese appearing, alert and oriented x 3 today.   HEENT: normocephalic, atraumatic; sclera clear, conjunctiva pink; hearing intact; oropharynx clear; neck supple  Lungs- Clear to ausculation bilaterally, normal work of breathing.  No wheezes, rales, rhonchi Heart- Regular rate and rhythm, no murmurs, rubs or gallops  GI- soft, non-tender, non-distended, bowel sounds present  Extremities- no clubbing, cyanosis, or edema  MS- no significant deformity or atrophy Skin- warm and dry, no rash or lesion, left axillae without hematoma/ecchymosis Psych- euthymic mood, full affect Neuro- strength and sensation are intact   Labs:   Lab Results  Component Value Date   WBC 7.4 10/01/2014   HGB 12.2 10/01/2014   HCT 37.4 10/01/2014   MCV 92.6 10/01/2014   PLT 261.0 10/01/2014   No results for input(s): NA, K, CL, CO2, BUN, CREATININE, CALCIUM, PROT, BILITOT, ALKPHOS, ALT, AST, GLUCOSE in the last 168 hours.  Invalid input(s): LABALBU  Discharge Medications:    Medication List    TAKE these medications        aspirin 81 MG tablet  Take 81 mg by mouth daily.     ASSURE COMFORT LANCETS 30G Misc  1 each by Other route daily.     atorvastatin 40 MG tablet  Commonly known as:  LIPITOR  take  1 tablet by mouth once daily     carvedilol 25 MG tablet  Commonly known as:  COREG  Take 1 tablet (25 mg total) by mouth 2 (two) times daily with a meal.     furosemide 40 MG tablet  Commonly known as:  LASIX  Take 1 tablet (40 mg total) by mouth 2 (two) times daily.     hydrALAZINE 100 MG tablet  Commonly known as:  APRESOLINE  Take 1 tablet (100 mg total) by mouth 3 (three) times daily.      irbesartan 150 MG tablet  Commonly known as:  AVAPRO  Take 1 tablet (150 mg total) by mouth 2 (two) times daily.     isosorbide mononitrate 30 MG 24 hr tablet  Commonly known as:  IMDUR  Take 2 tablets (60 mg total) by mouth daily.     levothyroxine 125 MCG tablet  Commonly known as:  SYNTHROID, LEVOTHROID  TAKE 2 TABLETS BY MOUTH DAILY BEFORE BREAKFAST     metFORMIN 500 MG tablet  Commonly known as:  GLUCOPHAGE  Take 1 tablet (500 mg total) by mouth 2 (two) times daily with a meal.     ONE TOUCH TEST STRIPS test strip  Generic drug:  glucose blood  Use as instructed to check blood sugar once a day     oxyCODONE-acetaminophen 10-325 MG per tablet  Commonly known as:  PERCOCET  Take 1 tablet by mouth every 4 (four) hours as needed for pain.     spironolactone 50 MG tablet  Commonly known as:  ALDACTONE  Take 1 tablet (50 mg total) by mouth daily.        Disposition:  Discharge Instructions    Diet - low sodium heart healthy    Complete by:  As directed      Increase activity slowly    Complete by:  As directed           Follow-up Information    Follow up with [facility] On 10/19/2014.   Why:  for wound check at 12Noon   Contact information:   [address] [place] [phone]       Duration of Discharge Encounter: Greater than 30 minutes including physician time.  Signed, Chanetta Marshall, NP 10/09/2014 7:49 AM

## 2014-10-14 ENCOUNTER — Telehealth: Payer: Self-pay

## 2014-10-14 NOTE — Telephone Encounter (Signed)
Prior auth for Irbesartan 150mg  sent to [facility] via Cover My Meds.

## 2014-10-16 ENCOUNTER — Telehealth: Payer: Self-pay

## 2014-10-16 NOTE — Telephone Encounter (Signed)
Fax received from [facility] with approval for Irbesartan 150mg . Good until 10/05/2015.

## 2014-10-19 ENCOUNTER — Ambulatory Visit (INDEPENDENT_AMBULATORY_CARE_PROVIDER_SITE_OTHER): Payer: Medicare PPO | Admitting: *Deleted

## 2014-10-19 DIAGNOSIS — Z4502 Encounter for adjustment and management of automatic implantable cardiac defibrillator: Secondary | ICD-10-CM

## 2014-10-19 DIAGNOSIS — I428 Other cardiomyopathies: Secondary | ICD-10-CM

## 2014-10-19 DIAGNOSIS — I429 Cardiomyopathy, unspecified: Secondary | ICD-10-CM | POA: Diagnosis not present

## 2014-10-19 DIAGNOSIS — I5022 Chronic systolic (congestive) heart failure: Secondary | ICD-10-CM | POA: Diagnosis not present

## 2014-10-19 LAB — BASIC METABOLIC PANEL
BUN: 25 mg/dL — ABNORMAL HIGH (ref 6–23)
CO2: 25 meq/L (ref 19–32)
CREATININE: 1.14 mg/dL (ref 0.40–1.20)
Calcium: 9.5 mg/dL (ref 8.4–10.5)
Chloride: 106 mEq/L (ref 96–112)
GFR: 60.39 mL/min (ref >60.00)
GLUCOSE: 122 mg/dL — AB (ref 70–99)
Potassium: 3.7 mEq/L (ref 3.5–5.1)
SODIUM: 140 meq/L (ref 135–145)

## 2014-10-20 LAB — CUP PACEART INCLINIC DEVICE CHECK
MDC IDC SESS DTM: 20160719150345
Pulse Gen Serial Number: 115852

## 2014-10-20 NOTE — Progress Notes (Signed)
S-ICD Wound check appointment. Tegaderm removed. Primary device incision well healed, incision edges approximated. No edema. Sub-sternal electrode incision has unapproximated edges w/ overlapped skin causing moisture retention. Antibiotic ointment + bandaid applied by AS. 0 untreated episodes; 0 treated episodes; 0 shocks delivered. Electrode impedance status okay. No programming changes. Remaining longevity to ERI 100%. ROV w/ SK 10/21/14 to review electrode tunneling site.

## 2014-10-21 ENCOUNTER — Encounter: Payer: Self-pay | Admitting: Internal Medicine

## 2014-10-21 ENCOUNTER — Ambulatory Visit (INDEPENDENT_AMBULATORY_CARE_PROVIDER_SITE_OTHER): Payer: Self-pay | Admitting: Internal Medicine

## 2014-10-21 VITALS — BP 132/56 | Ht 70.0 in | Wt 227.0 lb

## 2014-10-21 DIAGNOSIS — T827XXA Infection and inflammatory reaction due to other cardiac and vascular devices, implants and grafts, initial encounter: Secondary | ICD-10-CM

## 2014-10-21 NOTE — Progress Notes (Signed)
Patient Care Team: Doe-Hyun Kyra Searles, DO as PCP - General   HPI  Paula Stein is a 69 y.o. 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 At that point she recently been started on ARB is in Aldactone. We added nitrates to her hydralazine.  Repeat echocardiogram 4/16 confirmed Ejection fractions in the 30-35% range with moderate MR. She also severe LVH and left atrial dilatation  She has stable class 2-3 symptoms   Past Medical History  Diagnosis Date  . Hypertension   . Personal history of goiter   . S/P thyroidectomy   . S/P radioactive iodine thyroid ablation   . Iatrogenic thyroiditis   . Non-ischemic cardiomyopathy     EF 28%- reassessment of LV function 2011 with LVEf 45-50%  . History of cardiac cath 05/2007    normal-with patent coronaries  . PAD (peripheral artery disease)     lower extremities with ABIs of 0.5 bilaterally  . History of colonoscopy   . Diabetes mellitus without complication   . CHF (congestive heart failure)   . AICD (automatic cardioverter/defibrillator) present 10/08/2014    SUBQ    /    DR [name]  . Shortness of breath dyspnea   . History of hiatal hernia     Past Surgical History  Procedure Laterality Date  . Total abdominal hysterectomy    . Thyroidectomy    . Tonsillectomy    . Ep implantable device N/A 10/08/2014    Procedure: SubQ ICD Implant;  Surgeon: [name], MD;  Location: [facility];  Service: Cardiovascular;  Laterality: N/A;    Current Outpatient Prescriptions  Medication Sig Dispense Refill  . aspirin 81 MG tablet Take 81 mg by mouth daily.      . ASSURE COMFORT LANCETS 30G MISC 1 each by Other route daily.    Marland Kitchen atorvastatin (LIPITOR) 40 MG tablet take 1 tablet by mouth once daily 90 tablet 1  . carvedilol (COREG) 25 MG tablet Take 1 tablet (25 mg total) by mouth  2 (two) times daily with a meal. 180 tablet 1  . furosemide (LASIX) 40 MG tablet Take 1 tablet (40 mg total) by mouth 2 (two) times daily. 60 tablet 3  . glucose blood (ONE TOUCH TEST STRIPS) test strip Use as instructed to check blood sugar once a day     . hydrALAZINE (APRESOLINE) 100 MG tablet Take 1 tablet (100 mg total) by mouth 3 (three) times daily. 90 tablet 3  . irbesartan (AVAPRO) 150 MG tablet Take 1 tablet (150 mg total) by mouth 2 (two) times daily. 60 tablet 1  . isosorbide mononitrate (IMDUR) 30 MG 24 hr tablet Take 2 tablets (60 mg total) by mouth daily. 180 tablet 1  . levothyroxine (SYNTHROID, LEVOTHROID) 125 MCG tablet TAKE 2 TABLETS BY MOUTH DAILY BEFORE BREAKFAST 60 tablet 2  . metFORMIN (GLUCOPHAGE) 500 MG tablet Take 1 tablet (500 mg total) by mouth 2 (two) times daily with a meal. 180 tablet 1  . oxyCODONE-acetaminophen (PERCOCET) 10-325 MG per tablet Take 1 tablet by mouth every 4 (four) hours as needed for pain. (Patient not taking: Reported on 10/19/2014) 30 tablet 0  . spironolactone (ALDACTONE) 50 MG tablet Take 1 tablet (50 mg total) by mouth daily. 30 tablet 11   No current facility-administered medications for this visit.  Allergies  Allergen Reactions  . Codeine Nausea And Vomiting  . Ibuprofen Nausea Only    Review of Systems negative except from HPI and PMH  Physical Exam BP 132/56 mmHg  Ht 5\' 10"  (1.778 m)  Wt 227 lb (102.967 kg)  BMI 32.57 kg/m2   She has as retained suture on the lateral aspect of her sternal incision. She also has superficial dehiscence. The wound was probed it seems to be sealed bottom. The wound was packed with and a myotic secondary was applied we will plan to change the dressing on Friday

## 2014-10-21 NOTE — Patient Instructions (Signed)
Medication Instructions:  Your physician recommends that you continue on your current medications as directed. Please refer to the Current Medication list given to you today.  Labwork: None ordered  Testing/Procedures: None ordered  Follow-Up: Your physician recommends that you schedule a follow-up appointment on Friday with device clinic for dressing change   Any Other Special Instructions Will Be Listed Below (If Applicable). Thank you for choosing [facility]!!

## 2014-10-23 ENCOUNTER — Ambulatory Visit (INDEPENDENT_AMBULATORY_CARE_PROVIDER_SITE_OTHER): Payer: Medicare PPO | Admitting: *Deleted

## 2014-10-23 DIAGNOSIS — I5022 Chronic systolic (congestive) heart failure: Secondary | ICD-10-CM

## 2014-10-23 DIAGNOSIS — I429 Cardiomyopathy, unspecified: Secondary | ICD-10-CM

## 2014-10-23 DIAGNOSIS — I428 Other cardiomyopathies: Secondary | ICD-10-CM

## 2014-10-23 NOTE — Progress Notes (Signed)
Patient presents to the office for S-ICD wound recheck. Superficial dehiscence above xiphoid process still remains, but pt states that she believes that the area is "not as red as it was before". Area was redressed with antibiotic ointment and a Tegaderm patch. An extra Tegaderm was given to patient for the weekend upon request. Patient to follow up with a wound recheck on Monday, 7/25 @ 0830. Patient continues to deny any fever or chills.

## 2014-10-26 ENCOUNTER — Ambulatory Visit (INDEPENDENT_AMBULATORY_CARE_PROVIDER_SITE_OTHER): Payer: Medicare PPO | Admitting: *Deleted

## 2014-10-26 DIAGNOSIS — I429 Cardiomyopathy, unspecified: Secondary | ICD-10-CM

## 2014-10-26 DIAGNOSIS — I428 Other cardiomyopathies: Secondary | ICD-10-CM

## 2014-10-26 DIAGNOSIS — I5022 Chronic systolic (congestive) heart failure: Secondary | ICD-10-CM

## 2014-10-26 NOTE — Progress Notes (Signed)
SICD wound recheck. Superficial dehiscence above xiphoid process remains. No drainage present. Wound is not red or hot to touch. Patient denies fever or chills. Wound was cleaned with saline and gauze then redressed with antibiotic ointment and a bandage. Patient to follow up with the [facility] on 7/28 @ 0830 for wound recheck w/SK.

## 2014-10-29 ENCOUNTER — Ambulatory Visit (INDEPENDENT_AMBULATORY_CARE_PROVIDER_SITE_OTHER): Payer: Medicare PPO | Admitting: *Deleted

## 2014-10-29 DIAGNOSIS — I255 Ischemic cardiomyopathy: Secondary | ICD-10-CM

## 2014-10-29 NOTE — Progress Notes (Signed)
Wound recheck--SK evaluated and removed 2 sutures. Site was packed--shakila evaluated as well. Pt to return for wound recheck on 11-02-14. shakila aware.

## 2014-11-02 ENCOUNTER — Ambulatory Visit (INDEPENDENT_AMBULATORY_CARE_PROVIDER_SITE_OTHER): Payer: Medicare PPO | Admitting: *Deleted

## 2014-11-02 DIAGNOSIS — I429 Cardiomyopathy, unspecified: Secondary | ICD-10-CM

## 2014-11-02 DIAGNOSIS — I5022 Chronic systolic (congestive) heart failure: Secondary | ICD-10-CM

## 2014-11-02 DIAGNOSIS — I428 Other cardiomyopathies: Secondary | ICD-10-CM

## 2014-11-02 NOTE — Progress Notes (Signed)
S-ICD wound recheck. Superficial dehiscence above xiphoid process remains but improving. No drainage present. Wound is not red or hot to touch. Patient denies fever or chills. Wound was cleaned with saline and gauze then redressed with antibiotic ointment and a bandage. Additional bandaged given to pt. ROV w/  Device Clinic 11/06/14 while SK here.

## 2014-11-06 ENCOUNTER — Ambulatory Visit (INDEPENDENT_AMBULATORY_CARE_PROVIDER_SITE_OTHER): Payer: Medicare PPO | Admitting: *Deleted

## 2014-11-06 DIAGNOSIS — I428 Other cardiomyopathies: Secondary | ICD-10-CM

## 2014-11-06 DIAGNOSIS — I429 Cardiomyopathy, unspecified: Secondary | ICD-10-CM

## 2014-11-06 DIAGNOSIS — I5022 Chronic systolic (congestive) heart failure: Secondary | ICD-10-CM

## 2014-11-06 NOTE — Progress Notes (Signed)
Patient seen in office for SICD wound check w/SK. Superficial dehiscence above xiphoid process has mostly resolved. No drainage or discoloration present. Patient denies any fever or chills. Area was redressed with antibiotic ointment and a bandage. Plan to follow up on 8/11 @ 1300 for wound recheck.  (5) extra bandages were given to patient for redressing while at home.

## 2014-11-10 ENCOUNTER — Encounter: Payer: Self-pay | Admitting: Internal Medicine

## 2014-11-12 ENCOUNTER — Ambulatory Visit (INDEPENDENT_AMBULATORY_CARE_PROVIDER_SITE_OTHER): Payer: Medicare PPO | Admitting: *Deleted

## 2014-11-12 DIAGNOSIS — I255 Ischemic cardiomyopathy: Secondary | ICD-10-CM

## 2014-11-12 NOTE — Progress Notes (Signed)
Patient presents to the office for an SICD wound recheck. Wound above xiphoid process is healing well. Superficial dehiscence is now mostly closed, small stitch removed from the center of incision. Area was redressed with antibiotic ointment and bandage. Plan for patient to f/u with the device clinic on 8/18 @ 0830 for wound recheck.

## 2014-11-19 ENCOUNTER — Ambulatory Visit (INDEPENDENT_AMBULATORY_CARE_PROVIDER_SITE_OTHER): Payer: Medicare PPO | Admitting: *Deleted

## 2014-11-19 DIAGNOSIS — Z5189 Encounter for other specified aftercare: Secondary | ICD-10-CM

## 2014-11-19 NOTE — Progress Notes (Signed)
SICD wound recheck. Wound above xiphoid process is healing well. Superficial dehiscence is now closed. Pt is able to swab rubbing alcohol over it without burning sensation. I offered to give pt more bandages but she stated they don't stay adhered to her skin. Pt now released from wound re-checks. Pt aware to contact device clinic if any discoloration, localized warmth, overall fever or chills, or discharge from wound site of any color occurs. Pt also aware to clean wound site twice per day. Pt aware ROV w/ Dr. [name] 02/08/15.

## 2014-12-10 ENCOUNTER — Other Ambulatory Visit: Payer: Self-pay | Admitting: Internal Medicine

## 2014-12-16 ENCOUNTER — Encounter: Payer: Self-pay | Admitting: Internal Medicine

## 2014-12-21 ENCOUNTER — Encounter: Payer: Self-pay | Admitting: Internal Medicine

## 2015-01-07 ENCOUNTER — Other Ambulatory Visit: Payer: Self-pay | Admitting: Nurse Practitioner

## 2015-01-07 NOTE — Telephone Encounter (Signed)
If she needs refill on Percocet, she should have office visit to re-evaluate. She should not still have that much pain this far out from procedure.

## 2015-01-07 NOTE — Addendum Note (Signed)
Addended by: Chanetta Marshall K on: 01/07/2015 12:56 PM   Modules accepted: Orders

## 2015-01-10 ENCOUNTER — Encounter: Payer: Self-pay | Admitting: Internal Medicine

## 2015-01-13 ENCOUNTER — Encounter: Payer: Self-pay | Admitting: Adult Health

## 2015-01-13 ENCOUNTER — Ambulatory Visit (INDEPENDENT_AMBULATORY_CARE_PROVIDER_SITE_OTHER): Payer: Medicare PPO | Admitting: Adult Health

## 2015-01-13 VITALS — BP 130/80 | Temp 97.7°F | Ht 70.0 in | Wt 231.4 lb

## 2015-01-13 DIAGNOSIS — Z760 Encounter for issue of repeat prescription: Secondary | ICD-10-CM

## 2015-01-13 DIAGNOSIS — M7021 Olecranon bursitis, right elbow: Secondary | ICD-10-CM | POA: Diagnosis not present

## 2015-01-13 MED ORDER — OXYCODONE-ACETAMINOPHEN 10-325 MG PO TABS
1.0000 | ORAL_TABLET | ORAL | Status: DC | PRN
Start: 2015-01-13 — End: 2015-04-26

## 2015-01-13 NOTE — Progress Notes (Signed)
Subjective:    Patient ID: Paula Stein, female    DOB: 1945-12-22, 69 y.o.   MRN: [id number]  HPI  69 year old female, patient of Dr. Shawna Orleans. Who presents to the office today for swollen right elbow. She first noticed the swelling a week ago. She has been placing ice on the swollen elbow in the hopes that it was going to reduce the swelling. She endorses that this did not help and the swelling actually increased.  She also needs a refill of her Percocet s/p pace maker implantation.   Review of Systems  Constitutional: Negative.   Musculoskeletal: Negative.        Swelling to right elbow with slight tenderness  Skin: Negative.   All other systems reviewed and are negative.  Past Medical History  Diagnosis Date  . Hypertension   . Personal history of goiter   . S/P thyroidectomy   . S/P radioactive iodine thyroid ablation   . Iatrogenic thyroiditis   . Non-ischemic cardiomyopathy ([place])     EF 28%- reassessment of LV function 2011 with LVEf 45-50%  . History of cardiac cath 05/2007    normal-with patent coronaries  . PAD (peripheral artery disease) ([facility])     lower extremities with ABIs of 0.5 bilaterally  . History of colonoscopy   . Diabetes mellitus without complication ([place])   . CHF (congestive heart failure) ([place])   . AICD (automatic cardioverter/defibrillator) present 10/08/2014    SUBQ    /    DR [name]  . Shortness of breath dyspnea   . History of hiatal hernia     Social History   Social History  . Marital Status: Divorced    Spouse Name: N/A  . Number of Children: 1  . Years of Education: N/A   Occupational History  . Retired     Education officer, museum   Social History Main Topics  . Smoking status: Former Smoker -- 9 years    Quit date: 08/27/2014  . Smokeless tobacco: Never Used     Comment: Smokes 3 cigs week  . Alcohol Use: No  . Drug Use: No  . Sexual Activity: Not Currently   Other Topics Concern  . Not on file   Social History Narrative   Retired  Education officer, museum   Divorced - one grown son   current smoker    Alcohol use-no      Drug use-no    Regular exercise-yes        son - Noreta Mcnicholas    Past Surgical History  Procedure Laterality Date  . Total abdominal hysterectomy    . Thyroidectomy    . Tonsillectomy    . Ep implantable device N/A 10/08/2014    Procedure: SubQ ICD Implant;  Surgeon: [name], MD;  Location: [facility];  Service: Cardiovascular;  Laterality: N/A;    Family History  Problem Relation Age of Onset  . Diabetes type II Mother   . Hypertension Mother   . Diabetes Mother   . Heart disease Mother   . Other Father     deceased from accident age 23  . Hyperlipidemia Father     Allergies  Allergen Reactions  . Codeine Nausea And Vomiting  . Ibuprofen Nausea Only    Current Outpatient Prescriptions on File Prior to Visit  Medication Sig Dispense Refill  . aspirin 81 MG tablet Take 81 mg by mouth daily.      Marland Kitchen atorvastatin (LIPITOR) 40 MG  tablet take 1 tablet by mouth once daily 90 tablet 1  . carvedilol (COREG) 25 MG tablet Take 1 tablet (25 mg total) by mouth 2 (two) times daily with a meal. 180 tablet 1  . furosemide (LASIX) 40 MG tablet Take 1 tablet (40 mg total) by mouth 2 (two) times daily. 60 tablet 3  . hydrALAZINE (APRESOLINE) 100 MG tablet Take 1 tablet (100 mg total) by mouth 3 (three) times daily. 90 tablet 3  . irbesartan (AVAPRO) 150 MG tablet Take 1 tablet (150 mg total) by mouth 2 (two) times daily. 60 tablet 1  . isosorbide mononitrate (IMDUR) 30 MG 24 hr tablet Take 2 tablets (60 mg total) by mouth daily. 180 tablet 1  . levothyroxine (SYNTHROID, LEVOTHROID) 125 MCG tablet take 2 tablets by mouth once daily BEFORE BREAKFAST 60 tablet 2  . metFORMIN (GLUCOPHAGE) 500 MG tablet Take 1 tablet (500 mg total) by mouth 2 (two) times daily with a meal. 180 tablet 1  . spironolactone (ALDACTONE) 50 MG tablet Take 1 tablet (50 mg total) by mouth daily. 30 tablet 11   No current  facility-administered medications on file prior to visit.    BP 130/80 mmHg  Temp(Src) 97.7 F (36.5 C) (Oral)  Ht 5\' 10"  (1.778 m)  Wt 231 lb 6.4 oz (104.962 kg)  BMI 33.20 kg/m2       Objective:   Physical Exam  Constitutional: She is oriented to person, place, and time. She appears well-developed and well-nourished. No distress.  Neurological: She is alert and oriented to person, place, and time.  Skin: Skin is warm and dry. No rash noted. She is not diaphoretic. No erythema. No pallor.  Swelling to bursa of right elbow. Slightly warm. No redness   Psychiatric: She has a normal mood and affect. Her behavior is normal. Judgment and thought content normal.  Nursing note and vitals reviewed.      Assessment & Plan:  1. Bursitis of elbow, right - Consent obtained for aspiration of right elbow bursa. Area cleaned with alcohol and betadine. Cold spray used for topical anaesthesia. 18 gauge needle inserted into bursa of right elbow. 8 ml of synovial fluid was expressed. Synovial fluid did not appear infected. Will send for culture to make sure. Patient endorses decreased tenderness after aspiration. Advised follow-up if any signs of infection are present.  - Body fluid culture - Synovial fluid, crystal - Will send abx if needed once culture returns.   2. Medication refill - oxyCODONE-acetaminophen (PERCOCET) 10-325 MG tablet; Take 1 tablet by mouth every 4 (four) hours as needed for pain.  Dispense: 30 tablet; Refill: 0

## 2015-01-13 NOTE — Patient Instructions (Addendum)
It was great meeting you today!  I will send the fluid for a culture to make sure that there is no bacteria in it. If there is then we will start you on an antibiotic.   Follow up with any increased tenderness, redness or warmth.   As discussed, since you are on the computer for extended periods of time, it would be beneficial to get a soft pad to put under your elbow while you use the computer.      Elbow Bursitis Elbow bursitis is inflammation of the fluid-filled sac (bursa) between the tip of your elbow bone (olecranon) and your skin. Elbow bursitis may also be called olecranon bursitis. Normally, the olecranon bursa has only a small amount of fluid in it to cushion and protect your elbow bone. Elbow bursitis causes fluid to build up inside the bursa. Over time, this swelling and inflammation can cause pain when you bend or lean on your elbow.  CAUSES Elbow bursitis may be caused by:   Elbow injury (acute trauma).  Leaning on hard surfaces for long periods of time.  Infection from an injury that breaks the skin near your elbow.  A bone growth (spur) that forms at the tip of your elbow.  A medical condition that causes inflammation in your body, such as gout or rheumatoid arthritis.  The cause may also be unknown.  SIGNS AND SYMPTOMS  The first sign of elbow bursitis is usually swelling over the tip of your elbow. This can grow to be the size of a golf ball. This may start suddenly or develop gradually. You may also have:  Pain when bending or leaning on your elbow.  Restricted movement of your elbow.  If your bursitis is caused by an infection, symptoms may also include:  Redness, warmth, and tenderness of the elbow.  Drainage of pus from the swollen area over your elbow, if the skin breaks open. DIAGNOSIS  Your health care provider may be able to diagnose elbow bursitis based on your signs and symptoms, especially if you have recently been injured. Your health care  provider will also do a physical exam. This may include:  X-rays to look for a bone spur or a bone fracture.  Draining fluid from the bursa to test it for infection.  Blood tests to rule out gout or rheumatoid arthritis. TREATMENT  Treatment for elbow bursitis depends on the cause. Treatment may include:  Medicines. These may include:  Over-the-counter medicines to relieve pain and inflammation.  Antibiotic medicines to fight infection.  Injections of anti-inflammatory medicines (steroids).  Wrapping your elbow with a bandage.  Draining fluid from the bursa.  Wearing elbow pads.  If your bursitis does not get better with treatment, surgery may be needed to remove the bursa.  HOME CARE INSTRUCTIONS   Take medicines only as directed by your health care provider.  If you were prescribed an antibiotic medicine, finish all of it even if you start to feel better.  If your bursitis is caused by an injury, rest your elbow and wear your bandage as directed by your health care provider. You may alsoapply ice to the injured area as directed by your health care provider:  Put ice in a plastic bag.  Place a towel between your skin and the bag.  Leave the ice on for 20 minutes, 2-3 times per day.  Avoid any activities that cause elbow pain.  Use elbow pads or elbow wraps to cushion your elbow. SEEK MEDICAL CARE IF:  You have a fever.   Your symptoms do not get better with treatment.  Your pain or swelling gets worse.  Your elbow pain or swelling goes away and then returns.  You have drainage of pus from the swollen area over your elbow.   This information is not intended to replace advice given to you by your health care provider. Make sure you discuss any questions you have with your health care provider.   Document Released: 04/19/2006 Document Revised: 04/10/2014 Document Reviewed: 11/26/2013 Elsevier Interactive Patient Education [employer].

## 2015-01-14 LAB — SYNOVIAL FLUID, CRYSTAL: CRYSTALS FLUID: NONE SEEN

## 2015-01-16 LAB — CULTURE, ROUTINE-ABSCESS
Gram Stain: NONE SEEN
Gram Stain: NONE SEEN

## 2015-01-20 ENCOUNTER — Encounter: Payer: Self-pay | Admitting: Internal Medicine

## 2015-01-20 ENCOUNTER — Other Ambulatory Visit: Payer: Self-pay | Admitting: Internal Medicine

## 2015-02-08 ENCOUNTER — Encounter: Payer: Self-pay | Admitting: Internal Medicine

## 2015-02-08 ENCOUNTER — Ambulatory Visit (INDEPENDENT_AMBULATORY_CARE_PROVIDER_SITE_OTHER): Payer: Medicare PPO | Admitting: Internal Medicine

## 2015-02-08 VITALS — BP 140/90 | HR 73 | Ht 70.5 in | Wt 234.6 lb

## 2015-02-08 DIAGNOSIS — I428 Other cardiomyopathies: Secondary | ICD-10-CM

## 2015-02-08 DIAGNOSIS — I429 Cardiomyopathy, unspecified: Secondary | ICD-10-CM

## 2015-02-08 DIAGNOSIS — I5043 Acute on chronic combined systolic (congestive) and diastolic (congestive) heart failure: Secondary | ICD-10-CM | POA: Diagnosis not present

## 2015-02-08 LAB — CUP PACEART INCLINIC DEVICE CHECK
Implantable Lead Implant Date: 20160707
MDC IDC LEAD LOCATION: 753858
MDC IDC LEAD MODEL: 3401
MDC IDC PG SERIAL: 115852
MDC IDC SESS DTM: 20161107114922

## 2015-02-08 NOTE — Patient Instructions (Signed)
Medication Instructions: - no changes  Labwork: - none   Procedures/Testing: - none  Follow-Up: - Your physician wants you to follow-up in: 9 months with Dr. [name]. You will receive a reminder letter in the mail two months in advance. If you don't receive a letter, please call our office to schedule the follow-up appointment.  Any Additional Special Instructions Will Be Listed Below (If Applicable). - none

## 2015-02-08 NOTE — Progress Notes (Signed)
Patient Care Team: Doe-Hyun Kyra Searles, DO as PCP - General   HPI  Paula Stein is a 69 y.o. 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 At that point she recently been started on ARB and is on Aldactone. We added nitrates to her hydralazine.  Repeat echocardiogram 4/16 confirmed Ejection fractions in the 30-35% range with moderate MR. She also severe LVH and left atrial dilatation  She has stable class 2-  Symptoms  She has stopped smoking. Her breathlessness is better. She has no edema.    Past Medical History  Diagnosis Date  . Hypertension   . Personal history of goiter   . S/P thyroidectomy   . S/P radioactive iodine thyroid ablation   . Iatrogenic thyroiditis   . Non-ischemic cardiomyopathy ([place])     EF 28%- reassessment of LV function 2011 with LVEf 45-50%  . History of cardiac cath 05/2007    normal-with patent coronaries  . PAD (peripheral artery disease) ([facility])     lower extremities with ABIs of 0.5 bilaterally  . History of colonoscopy   . Diabetes mellitus without complication ([place])   . CHF (congestive heart failure) ([place])   . AICD (automatic cardioverter/defibrillator) present 10/08/2014    SUBQ    /    DR [name]  . Shortness of breath dyspnea   . History of hiatal hernia     Past Surgical History  Procedure Laterality Date  . Total abdominal hysterectomy    . Thyroidectomy    . Tonsillectomy    . Ep implantable device N/A 10/08/2014    Procedure: SubQ ICD Implant;  Surgeon: [name], MD;  Location: [facility];  Service: Cardiovascular;  Laterality: N/A;    Current Outpatient Prescriptions  Medication Sig Dispense Refill  . aspirin 81 MG tablet Take 81 mg by mouth daily.      Marland Kitchen atorvastatin (LIPITOR) 40 MG tablet take 1 tablet by mouth once daily 90 tablet 1  . carvedilol (COREG) 25 MG  tablet take 1 tablet by mouth twice a day with food 180 tablet 1  . furosemide (LASIX) 40 MG tablet Take 1 tablet (40 mg total) by mouth 2 (two) times daily. 60 tablet 3  . hydrALAZINE (APRESOLINE) 100 MG tablet Take 1 tablet (100 mg total) by mouth 3 (three) times daily. 90 tablet 3  . irbesartan (AVAPRO) 150 MG tablet Take 1 tablet (150 mg total) by mouth 2 (two) times daily. 60 tablet 1  . isosorbide mononitrate (IMDUR) 30 MG 24 hr tablet Take 2 tablets (60 mg total) by mouth daily. 180 tablet 1  . levothyroxine (SYNTHROID, LEVOTHROID) 125 MCG tablet take 2 tablets by mouth once daily BEFORE BREAKFAST 60 tablet 2  . metFORMIN (GLUCOPHAGE) 500 MG tablet Take 1 tablet (500 mg total) by mouth 2 (two) times daily with a meal. 180 tablet 1  . oxyCODONE-acetaminophen (PERCOCET) 10-325 MG tablet Take 1 tablet by mouth every 4 (four) hours as needed for pain. 30 tablet 0  . spironolactone (ALDACTONE) 50 MG tablet Take 1 tablet (50 mg total) by mouth daily. 30 tablet 11   No current facility-administered medications for this visit.    Allergies  Allergen Reactions  . Codeine Nausea And Vomiting  . Ibuprofen Nausea Only      Review of Systems negative except from HPI and PMH  Physical Exam BP 140/90 mmHg  Pulse 73  Ht 5' 10.5" (1.791 m)  Wt 234 lb 9.6 oz (106.414 kg)  BMI 33.17 kg/m2 Well developed and well nourished in no acute distress HENT normal E scleral and icterus clear Neck Supple JVP flat; carotids brisk and full Clear to ausculation Device pocket well healed; without hematoma or erythema.  There is no tethering  Regular rate and rhythm, no murmurs gallops or rub Soft with active bowel sounds No clubbing cyanosis  Edema Alert and oriented, grossly normal motor and sensory function Skin Warm and Dry  ECG demonstrates sinus rhythm at 73 with intervals 23/09/40 axis LXVI LVH with repolarization abnormalities  Assessment and  Plan  Nonischemic cardiomyopathy  CHF   Chronic systolic  ICD-Subcutaneous   Hypertension   Blood pressure is well-controlled; she had not taken here medications this morning.  Device pocket is well-healed  She is euvolemic.  She has stopped smoking and has significant less shortness of breath.

## 2015-02-17 ENCOUNTER — Ambulatory Visit: Payer: Medicare PPO | Admitting: Internal Medicine

## 2015-03-02 ENCOUNTER — Encounter: Payer: Self-pay | Admitting: Internal Medicine

## 2015-03-02 DIAGNOSIS — G4731 Primary central sleep apnea: Secondary | ICD-10-CM

## 2015-03-02 NOTE — Telephone Encounter (Signed)
MyChart message above:  Please advise.

## 2015-03-04 NOTE — Telephone Encounter (Signed)
Per MW: Madaline Brilliant to make it happen and close out this message

## 2015-03-04 NOTE — Telephone Encounter (Signed)
Pt is scheduled to see Dr Melvyn Novas 03/30/15 @ 9:30 for Sleep [facility] [place] patient Needing Rx for CPAP supplies sent to supplier.  Please advise Dr Melvyn Novas if you are okay to send these supplies prior to seeing this patient to establish in office. Thanks.

## 2015-03-04 NOTE — Telephone Encounter (Signed)
I have sent order. Nothing further needed

## 2015-03-05 ENCOUNTER — Telehealth: Payer: Self-pay | Admitting: Internal Medicine

## 2015-03-05 NOTE — Telephone Encounter (Signed)
LMTCB x 1 

## 2015-03-08 NOTE — Telephone Encounter (Signed)
[name] with [facility], [phone] calling about the status of RX for CPAP that was missing diagnosis code.

## 2015-03-11 ENCOUNTER — Encounter: Payer: Self-pay | Admitting: Cardiovascular Disease

## 2015-03-11 DIAGNOSIS — G4733 Obstructive sleep apnea (adult) (pediatric): Secondary | ICD-10-CM | POA: Diagnosis not present

## 2015-03-18 ENCOUNTER — Encounter: Payer: Self-pay | Admitting: Cardiovascular Disease

## 2015-03-30 ENCOUNTER — Ambulatory Visit: Payer: Medicare PPO | Admitting: Internal Medicine

## 2015-04-06 ENCOUNTER — Ambulatory Visit (INDEPENDENT_AMBULATORY_CARE_PROVIDER_SITE_OTHER): Payer: 59 | Admitting: Family Medicine

## 2015-04-06 ENCOUNTER — Encounter: Payer: Self-pay | Admitting: Adult Health

## 2015-04-06 ENCOUNTER — Encounter: Payer: Self-pay | Admitting: Family Medicine

## 2015-04-06 VITALS — BP 132/92 | HR 92 | Temp 99.8°F | Ht 70.5 in | Wt 230.7 lb

## 2015-04-06 DIAGNOSIS — I10 Essential (primary) hypertension: Secondary | ICD-10-CM | POA: Diagnosis not present

## 2015-04-06 DIAGNOSIS — J069 Acute upper respiratory infection, unspecified: Secondary | ICD-10-CM | POA: Diagnosis not present

## 2015-04-06 MED ORDER — DOXYCYCLINE HYCLATE 100 MG PO CAPS
100.0000 mg | ORAL_CAPSULE | Freq: Two times a day (BID) | ORAL | Status: DC
Start: 2015-04-06 — End: 2015-04-26

## 2015-04-06 MED ORDER — HYDROCODONE-HOMATROPINE 5-1.5 MG/5ML PO SYRP: 5.0000 mL | ORAL_SOLUTION | Freq: Three times a day (TID) | ORAL | Status: DC | PRN

## 2015-04-06 NOTE — Patient Instructions (Signed)
Use the cough medication only as instructed and use caution.  If not improving or worsening with measures below take the antibiotic.   INSTRUCTIONS FOR UPPER RESPIRATORY INFECTION:  -plenty of rest and fluids  -nasal saline wash 2-3 times daily (use prepackaged nasal saline or bottled/distilled water if making your own)   -can use AFRIN nasal spray for drainage and nasal congestion - but do NOT use longer then 3-4 days  -can use tylenol (in no history of liver disease) as directed for aches and sorethroat  -in the winter time, using a humidifier at night is helpful (please follow cleaning instructions)  -if you are taking a cough medication - use only as directed, may also try a teaspoon of honey to coat the throat and throat lozenges. If given a cough medication with codeine or hydrocodone or other narcotic please be advised that this contains a strong and  potentially addicting medication. Please follow instructions carefully, take as little as possible and only use AS NEEDED for severe cough. Discuss potential side effects with your pharmacy. Please do not drive or operate machinery while taking these types of medications. Please do not take other sedating medications, drugs or alcohol while taking this medication without discussing with your doctor.  -for sore throat, salt water gargles can help  -follow up if you have fevers, facial pain, tooth pain, difficulty breathing or are worsening or symptoms persist longer then expected  Upper Respiratory Infection, Adult An upper respiratory infection (URI) is also known as the common cold. It is often caused by a type of germ (virus). Colds are easily spread (contagious). You can pass it to others by kissing, coughing, sneezing, or drinking out of the same glass. Usually, you get better in 1 to 3  weeks.  However, the cough can last for even longer. HOME CARE   Only take medicine as told by your doctor. Follow instructions provided  above.  Drink enough water and fluids to keep your pee (urine) clear or pale yellow.  Get plenty of rest.  Return to work when your temperature is < 100 for 24 hours or as told by your doctor. You may use a face mask and wash your hands to stop your cold from spreading. GET HELP RIGHT AWAY IF:   After the first few days, you feel you are getting worse.  You have questions about your medicine.  You have chills, shortness of breath, or red spit (mucus).  You have pain in the face for more then 1-2 days, especially when you bend forward.  You have a fever, puffy (swollen) neck, pain when you swallow, or white spots in the back of your throat.  You have a bad headache, ear pain, sinus pain, or chest pain.  You have a high-pitched whistling sound when you breathe in and out (wheezing).  You cough up blood.  You have sore muscles or a stiff neck. MAKE SURE YOU:   Understand these instructions.  Will watch your condition.  Will get help right away if you are not doing well or get worse. Document Released: 09/06/2007 Document Revised: 06/12/2011 Document Reviewed: 06/25/2013 [facility] Patient Information 2015 [place], [place]. This information is not intended to replace advice given to you by your health care provider. Make sure you discuss any questions you have with your health care provider.

## 2015-04-06 NOTE — Progress Notes (Signed)
Pre visit review using our clinic review tool, if applicable. No additional management support is needed unless otherwise documented below in the visit note. 

## 2015-04-06 NOTE — Telephone Encounter (Signed)
Spoke with pt and advised of appt today with Dr. Maudie Mercury at 3:30 pm.  Advised pt that if her symptoms worsen (fever, sob) to seek medical attention immediately.  Pt did not sound short of breath on the phone call.  Pt verbalized understanding.

## 2015-04-06 NOTE — Telephone Encounter (Signed)
Called and left a message for pt to return call to set up an appt for today.

## 2015-04-06 NOTE — Progress Notes (Signed)
HPI:  URI: -started: about 1 week ago -symptoms:nasal congestion - now with thick white nasal congestion, sore throat, cough - productive, PND -denies:fever, SOB, NVD, tooth pain -has tried: nothing -sick contacts/travel/risks: denies flu exposure, tick exposure or or Ebola risks -wants refill on hycodan cough medication she reports she has taken without issue in the past, she also wants an antibiotic "shot" ROS: See pertinent positives and negatives per HPI.  Past Medical History  Diagnosis Date  . Hypertension   . Personal history of goiter   . S/P thyroidectomy   . S/P radioactive iodine thyroid ablation   . Iatrogenic thyroiditis   . Non-ischemic cardiomyopathy ([place])     EF 28%- reassessment of LV function 2011 with LVEf 45-50%  . History of cardiac cath 05/2007    normal-with patent coronaries  . PAD (peripheral artery disease) ([facility])     lower extremities with ABIs of 0.5 bilaterally  . History of colonoscopy   . Diabetes mellitus without complication ([place])   . CHF (congestive heart failure) ([place])   . AICD (automatic cardioverter/defibrillator) present 10/08/2014    SUBQ    /    DR [name]  . Shortness of breath dyspnea   . History of hiatal hernia     Past Surgical History  Procedure Laterality Date  . Total abdominal hysterectomy    . Thyroidectomy    . Tonsillectomy    . Ep implantable device N/A 10/08/2014    Procedure: SubQ ICD Implant;  Surgeon: [name], MD;  Location: [facility];  Service: Cardiovascular;  Laterality: N/A;    Family History  Problem Relation Age of Onset  . Diabetes type II Mother   . Hypertension Mother   . Diabetes Mother   . Heart disease Mother   . Other Father     deceased from accident age 45  . Hyperlipidemia Father     Social History   Social History  . Marital Status: Divorced    Spouse Name: N/A  . Number of Children: 1  . Years of Education: N/A   Occupational History  . Retired     Education officer, museum    Social History Main Topics  . Smoking status: Former Smoker -- 61 years    Quit date: 08/27/2014  . Smokeless tobacco: Never Used     Comment: Smokes 3 cigs week  . Alcohol Use: No  . Drug Use: No  . Sexual Activity: Not Currently   Other Topics Concern  . None   Social History Narrative   Retired Education officer, museum   Divorced - one grown son   current smoker    Alcohol use-no      Drug use-no    Regular exercise-yes        son Tonique Mares     Current outpatient prescriptions:  .  aspirin 81 MG tablet, Take 81 mg by mouth daily.  , Disp: , Rfl:  .  atorvastatin (LIPITOR) 40 MG tablet, take 1 tablet by mouth once daily, Disp: 90 tablet, Rfl: 1 .  carvedilol (COREG) 25 MG tablet, take 1 tablet by mouth twice a day with food, Disp: 180 tablet, Rfl: 1 .  furosemide (LASIX) 40 MG tablet, Take 1 tablet (40 mg total) by mouth 2 (two) times daily., Disp: 60 tablet, Rfl: 3 .  hydrALAZINE (APRESOLINE) 100 MG tablet, Take 1 tablet (100 mg total) by mouth 3 (three) times daily., Disp: 90 tablet, Rfl: 3 .  irbesartan (AVAPRO)  150 MG tablet, Take 1 tablet (150 mg total) by mouth 2 (two) times daily., Disp: 60 tablet, Rfl: 1 .  isosorbide mononitrate (IMDUR) 30 MG 24 hr tablet, Take 2 tablets (60 mg total) by mouth daily., Disp: 180 tablet, Rfl: 1 .  levothyroxine (SYNTHROID, LEVOTHROID) 125 MCG tablet, take 2 tablets by mouth once daily BEFORE BREAKFAST, Disp: 60 tablet, Rfl: 2 .  metFORMIN (GLUCOPHAGE) 500 MG tablet, Take 1 tablet (500 mg total) by mouth 2 (two) times daily with a meal., Disp: 180 tablet, Rfl: 1 .  oxyCODONE-acetaminophen (PERCOCET) 10-325 MG tablet, Take 1 tablet by mouth every 4 (four) hours as needed for pain., Disp: 30 tablet, Rfl: 0 .  spironolactone (ALDACTONE) 50 MG tablet, Take 1 tablet (50 mg total) by mouth daily., Disp: 30 tablet, Rfl: 11 .  doxycycline (VIBRAMYCIN) 100 MG capsule, Take 1 capsule (100 mg total) by mouth 2 (two) times daily., Disp: 14 capsule, Rfl:  0 .  HYDROcodone-homatropine (HYCODAN) 5-1.5 MG/5ML syrup, Take 5 mLs by mouth every 8 (eight) hours as needed for cough., Disp: 120 mL, Rfl: 0  EXAM:  Filed Vitals:   04/06/15 1533  BP: 132/92  Pulse: 92  Temp: 99.8 F (37.7 C)    Body mass index is 32.62 kg/(m^2).  GENERAL: vitals reviewed and listed above, alert, oriented, appears well hydrated and in no acute distress  HEENT: atraumatic, conjunttiva clear, no obvious abnormalities on inspection of external nose and ears, normal appearance of ear canals and TMs, clear nasal congestion, mild post oropharyngeal erythema with PND, no tonsillar edema or exudate, no sinus TTP  NECK: no obvious masses on inspection  LUNGS: clear to auscultation bilaterally, no wheezes, rales or rhonchi, good air movement  CV: HRRR, no peripheral edema  MS: moves all extremities without noticeable abnormality  PSYCH: pleasant and cooperative, no obvious depression or anxiety  ASSESSMENT AND PLAN:  Discussed the following assessment and plan:  Acute upper respiratory infection  Essential hypertension  -given HPI and exam findings today, a serious infection or illness is unlikely. We discussed potential etiologies, with VURI being most likely, and advised supportive care and monitoring. She request cough medication and assures me she can take hycodan without any problems despite listed allergies. She has a low grade temp and has had symptoms for one week so incase developing 2ndary bacterial sinusitis or other resp infection doxy rx provided in case worsening or not improving over the next few days. We discussed treatment side effects, likely course, antibiotic misuse, transmission, and signs of developing a serious illness.  BP is mildly elevated.She is sure is due to coughing. Advised follow up with PCP. -of course, we advised to return or notify a doctor immediately if symptoms worsen or persist or new concerns arise.    Patient Instructions   Use the cough medication only as instructed and use caution.  If not improving or worsening with measures below take the antibiotic.   INSTRUCTIONS FOR UPPER RESPIRATORY INFECTION:  -plenty of rest and fluids  -nasal saline wash 2-3 times daily (use prepackaged nasal saline or bottled/distilled water if making your own)   -can use AFRIN nasal spray for drainage and nasal congestion - but do NOT use longer then 3-4 days  -can use tylenol (in no history of liver disease) as directed for aches and sorethroat  -in the winter time, using a humidifier at night is helpful (please follow cleaning instructions)  -if you are taking a cough medication - use only as  directed, may also try a teaspoon of honey to coat the throat and throat lozenges. If given a cough medication with codeine or hydrocodone or other narcotic please be advised that this contains a strong and  potentially addicting medication. Please follow instructions carefully, take as little as possible and only use AS NEEDED for severe cough. Discuss potential side effects with your pharmacy. Please do not drive or operate machinery while taking these types of medications. Please do not take other sedating medications, drugs or alcohol while taking this medication without discussing with your doctor.  -for sore throat, salt water gargles can help  -follow up if you have fevers, facial pain, tooth pain, difficulty breathing or are worsening or symptoms persist longer then expected  Upper Respiratory Infection, Adult An upper respiratory infection (URI) is also known as the common cold. It is often caused by a type of germ (virus). Colds are easily spread (contagious). You can pass it to others by kissing, coughing, sneezing, or drinking out of the same glass. Usually, you get better in 1 to 3  weeks.  However, the cough can last for even longer. HOME CARE   Only take medicine as told by your doctor. Follow instructions provided  above.  Drink enough water and fluids to keep your pee (urine) clear or pale yellow.  Get plenty of rest.  Return to work when your temperature is < 100 for 24 hours or as told by your doctor. You may use a face mask and wash your hands to stop your cold from spreading. GET HELP RIGHT AWAY IF:   After the first few days, you feel you are getting worse.  You have questions about your medicine.  You have chills, shortness of breath, or red spit (mucus).  You have pain in the face for more then 1-2 days, especially when you bend forward.  You have a fever, puffy (swollen) neck, pain when you swallow, or white spots in the back of your throat.  You have a bad headache, ear pain, sinus pain, or chest pain.  You have a high-pitched whistling sound when you breathe in and out (wheezing).  You cough up blood.  You have sore muscles or a stiff neck. MAKE SURE YOU:   Understand these instructions.  Will watch your condition.  Will get help right away if you are not doing well or get worse. Document Released: 09/06/2007 Document Revised: 06/12/2011 Document Reviewed: 06/25/2013 [facility] Patient Information 2015 [place], [place]. This information is not intended to replace advice given to you by your health care provider. Make sure you discuss any questions you have with your health care provider.      Colin Benton R.

## 2015-04-26 ENCOUNTER — Ambulatory Visit (INDEPENDENT_AMBULATORY_CARE_PROVIDER_SITE_OTHER): Payer: Medicare Other | Admitting: Internal Medicine

## 2015-04-26 ENCOUNTER — Encounter: Payer: Self-pay | Admitting: Internal Medicine

## 2015-04-26 VITALS — BP 180/106 | HR 71 | Ht 71.0 in | Wt 232.8 lb

## 2015-04-26 DIAGNOSIS — G4737 Central sleep apnea in conditions classified elsewhere: Secondary | ICD-10-CM | POA: Diagnosis not present

## 2015-04-26 DIAGNOSIS — F17210 Nicotine dependence, cigarettes, uncomplicated: Secondary | ICD-10-CM

## 2015-04-26 DIAGNOSIS — G4733 Obstructive sleep apnea (adult) (pediatric): Secondary | ICD-10-CM | POA: Diagnosis not present

## 2015-04-26 DIAGNOSIS — I10 Essential (primary) hypertension: Secondary | ICD-10-CM | POA: Diagnosis not present

## 2015-04-26 DIAGNOSIS — G4731 Primary central sleep apnea: Secondary | ICD-10-CM

## 2015-04-26 NOTE — Assessment & Plan Note (Signed)
Discussed the risks and costs (both direct and indirect)  of smoking relative to the benefits of quitting but patient unwilling to commit at this point to a specific quit date.    Suggested e cigs as a one way bridge off all tobacco products >  Follow up per Primary Care planned

## 2015-04-26 NOTE — Assessment & Plan Note (Signed)
NPSG 09/2012:  AHI 69/hr with central and obstructive events (+ C-S respirations)   Failed cpap, excellent response to bilevel 17/13. - 04/26/2015 download requested/ pt requests nasal pillows   I had an extended discussion with the patient reviewing all relevant studies completed to date and  Lasting 25  minutes of a 61m transition of care office    Each maintenance medication was reviewed in detail including most importantly the difference between maintenance and prns and under what circumstances the prns are to be triggered using an action plan format that is not reflected in the computer generated alphabetically organized AVS.    Please see instructions for details which were reviewed in writing and the patient given a copy highlighting the part that I personally wrote and discussed at today's ov.

## 2015-04-26 NOTE — Progress Notes (Signed)
Subjective:     Patient ID: LAYNEY FEELEY, female   DOB: 28-Mar-1946, 70 y.o.   MRN: [id number]  HPI  50 yobf active smoker previously followed by [facility] for complex sleep apnea on BIPAP/ last seen 07/07/2013 self referred for re-eval and wants to change to nasal pillows    04/26/2015 1st [facility] office visit/ Chayil Gantt   Chief Complaint  Patient presents with  . Follow-up    Former Dr Gwenette Greet patient. Pt states here to f/u on sleep. She is doing well with CPAP and averages about 9 hours of sleep every night. She would like to try nasal mask rather than FFM.   feels rested/ not sleepy during the day, no am ha p using 9.5 h per night  Wt up since original study x 8 lbs   No   assoc chronic cough or cp or chest tightness, subjective wheeze or overt sinus or hb symptoms. No unusual exp hx or h/o childhood pna/ asthma or knowledge of premature birth.  Sleeping ok without nocturnal  or early am exacerbation  of respiratory  c/o's or need for noct saba. Also denies any obvious fluctuation of symptoms with weather or environmental changes or other aggravating or alleviating factors except as outlined above   Current Medications, Allergies, Complete Past Medical History, Past Surgical History, Family History, and Social History were reviewed in [employer] record.  ROS  The following are not active complaints unless bolded sore throat, dysphagia, dental problems, itching, sneezing,  nasal congestion or excess/ purulent secretions, ear ache,   fever, chills, sweats, unintended wt loss, classically pleuritic or exertional cp, hemoptysis,  orthopnea pnd or leg swelling, presyncope, palpitations, abdominal pain, anorexia, nausea, vomiting, diarrhea  or change in bowel or bladder habits, change in stools or urine, dysuria,hematuria,  rash, arthralgias, visual complaints, headache, numbness, weakness or ataxia or problems with walking or coordination,  change in mood/affect or memory.          Review of Systems     Objective:   Physical Exam amb bf nad   Wt Readings from Last 3 Encounters:  04/26/15 232 lb 12.8 oz (105.597 kg)  04/06/15 230 lb 11.2 oz (104.645 kg)  02/08/15 234 lb 9.6 oz (106.414 kg)    Vital signs reviewed   HEENT: nl   turbinates, and oropharynx. Nl external ear canals without cough reflex- upper denture  Modified Mallampati Score = 1   NECK :  without JVD/Nodes/TM/ nl carotid upstrokes bilaterally   LUNGS: no acc muscle use,  Nl contour chest which is clear to A and P bilaterally without cough on insp or exp maneuvers   CV:  RRR  no s3 or murmur or increase in P2, no edema   ABD:  soft and nontender with nl inspiratory excursion in the supine position. No bruits or organomegaly, bowel sounds nl  MS:  Nl gait/ ext warm without deformities, calf tenderness, cyanosis or clubbing No obvious joint restrictions   SKIN: warm and dry without lesions    NEURO:  alert, approp, nl sensorium with  no motor deficits        Assessment:

## 2015-04-26 NOTE — Patient Instructions (Signed)
Please see patient coordinator before you leave today  to schedule nasal pillows and download   Please schedule a follow up office visit in 12 months, call sooner if needed

## 2015-04-26 NOTE — Assessment & Plan Note (Signed)
Not Adequate control on present rx, reviewed > did not take am meds/ suggested she do so > Follow up per Primary Care planned

## 2015-05-12 ENCOUNTER — Telehealth: Payer: Self-pay | Admitting: *Deleted

## 2015-05-12 DIAGNOSIS — E1140 Type 2 diabetes mellitus with diabetic neuropathy, unspecified: Secondary | ICD-10-CM

## 2015-05-12 DIAGNOSIS — E785 Hyperlipidemia, unspecified: Secondary | ICD-10-CM

## 2015-05-12 DIAGNOSIS — E039 Hypothyroidism, unspecified: Secondary | ICD-10-CM

## 2015-05-12 NOTE — Telephone Encounter (Signed)
Left message on machine for patient to call and schedule an appointment to follow up with diabetes.  Labs ordered

## 2015-05-19 ENCOUNTER — Ambulatory Visit (INDEPENDENT_AMBULATORY_CARE_PROVIDER_SITE_OTHER): Payer: Medicare Other | Admitting: *Deleted

## 2015-05-19 DIAGNOSIS — I429 Cardiomyopathy, unspecified: Secondary | ICD-10-CM

## 2015-05-19 DIAGNOSIS — I428 Other cardiomyopathies: Secondary | ICD-10-CM

## 2015-05-19 NOTE — Progress Notes (Signed)
Remote ICD transmission.   

## 2015-06-02 ENCOUNTER — Ambulatory Visit (INDEPENDENT_AMBULATORY_CARE_PROVIDER_SITE_OTHER): Payer: Medicare Other | Admitting: Family Medicine

## 2015-06-02 ENCOUNTER — Encounter: Payer: Self-pay | Admitting: Family Medicine

## 2015-06-02 VITALS — BP 152/80 | HR 82 | Temp 97.6°F | Ht 69.75 in | Wt 231.4 lb

## 2015-06-02 DIAGNOSIS — Z0000 Encounter for general adult medical examination without abnormal findings: Secondary | ICD-10-CM | POA: Diagnosis not present

## 2015-06-02 DIAGNOSIS — E1142 Type 2 diabetes mellitus with diabetic polyneuropathy: Secondary | ICD-10-CM

## 2015-06-02 DIAGNOSIS — E039 Hypothyroidism, unspecified: Secondary | ICD-10-CM | POA: Diagnosis not present

## 2015-06-02 DIAGNOSIS — I10 Essential (primary) hypertension: Secondary | ICD-10-CM

## 2015-06-02 DIAGNOSIS — F17210 Nicotine dependence, cigarettes, uncomplicated: Secondary | ICD-10-CM

## 2015-06-02 DIAGNOSIS — E785 Hyperlipidemia, unspecified: Secondary | ICD-10-CM | POA: Diagnosis not present

## 2015-06-02 DIAGNOSIS — I428 Other cardiomyopathies: Secondary | ICD-10-CM

## 2015-06-02 DIAGNOSIS — E1140 Type 2 diabetes mellitus with diabetic neuropathy, unspecified: Secondary | ICD-10-CM | POA: Diagnosis not present

## 2015-06-02 DIAGNOSIS — Z720 Tobacco use: Secondary | ICD-10-CM

## 2015-06-02 DIAGNOSIS — I5022 Chronic systolic (congestive) heart failure: Secondary | ICD-10-CM

## 2015-06-02 DIAGNOSIS — I429 Cardiomyopathy, unspecified: Secondary | ICD-10-CM

## 2015-06-02 LAB — LIPID PANEL
CHOLESTEROL: 262 mg/dL — AB (ref 0–200)
HDL: 53.8 mg/dL (ref >39.00)
LDL CALC: 177 mg/dL — AB (ref 0–99)
NonHDL: 208.56
TRIGLYCERIDES: 159 mg/dL — AB (ref 0.0–149.0)
Total CHOL/HDL Ratio: 5
VLDL: 31.8 mg/dL (ref 0.0–40.0)

## 2015-06-02 LAB — HEPATIC FUNCTION PANEL
ALT: 10 U/L (ref 0–35)
AST: 13 U/L (ref 0–37)
Albumin: 4.3 g/dL (ref 3.5–5.2)
Alkaline Phosphatase: 113 U/L (ref 39–117)
Bilirubin, Direct: 0.1 mg/dL (ref 0.0–0.3)
TOTAL PROTEIN: 8.2 g/dL (ref 6.0–8.3)
Total Bilirubin: 0.6 mg/dL (ref 0.2–1.2)

## 2015-06-02 LAB — MICROALBUMIN / CREATININE URINE RATIO
CREATININE, U: 227 mg/dL
MICROALB UR: 33.5 mg/dL — AB (ref 0.0–1.9)
Microalb Creat Ratio: 14.8 mg/g (ref 0.0–30.0)

## 2015-06-02 LAB — BASIC METABOLIC PANEL
BUN: 16 mg/dL (ref 6–23)
CALCIUM: 9.8 mg/dL (ref 8.4–10.5)
CO2: 24 meq/L (ref 19–32)
CREATININE: 1 mg/dL (ref 0.40–1.20)
Chloride: 106 mEq/L (ref 96–112)
GFR: 70.13 mL/min (ref >60.00)
GLUCOSE: 127 mg/dL — AB (ref 70–99)
Potassium: 4.4 mEq/L (ref 3.5–5.1)
SODIUM: 139 meq/L (ref 135–145)

## 2015-06-02 LAB — HEMOGLOBIN A1C: Hgb A1c MFr Bld: 7.5 % — ABNORMAL HIGH (ref 4.6–6.5)

## 2015-06-02 LAB — TSH: TSH: 1.6 u[IU]/mL (ref 0.35–4.50)

## 2015-06-02 NOTE — Progress Notes (Signed)
Medicare Annual Preventive Care Visit  (initial annual wellness or annual wellness exam)  Concerns and/or follow up today:  NICM/HTN: -sees Dr. [name] in cardiology and is s/p ICD placement -EF around 30-35% per cardiology notes -meds:asa, coreg 25 bid, lasix 40 bid, hydralazine 100 tid, irbesartan 150 bid (she plans to she with cardiologist on other options as her insurance is not going to continue to pay - just got refill), imdur 60mg  dialy, spironolactone 50mg  -she reports she misses doses of her BP medications about every other day and did not take her medications today -denies: CP, SOB, palpitations, swelling -quit smoking; but reports restarted a few months ago   Diabetes melitis: -meds: metformin -eye exam: not done, sees dr. Judd Lien -foot exam: today -denies: wounds, highs, lows, vision changes  Foot pain: -for about 2 weeks after wearing new shoes on lots of stairs -dorsal foot, moderate pain with certain activities or pressure, improving -no known trauma -no swelling, redness, fevers, malaise  OSA: -using CPAP nightly -saw pulmonology recently for check up  ROS: negative for report of fevers, unintentional weight loss, vision changes, vision loss, hearing loss or change, chest pain, sob, hemoptysis, melena, hematochezia, hematuria, genital discharge or lesions, falls, bleeding or bruising, loc, thoughts of suicide or self harm, memory loss  1.) Patient-completed health risk assessment  - completed and reviewed, see scanned documentation  2.) Review of Medical History: -PMH, PSH, Family History and current specialty and care providers reviewed and updated and listed below  - see scanned in document in chart and below  Past Medical History  Diagnosis Date  . Hypertension   . Personal history of goiter   . S/P thyroidectomy   . S/P radioactive iodine thyroid ablation   . Iatrogenic thyroiditis   . Non-ischemic cardiomyopathy ([place])     EF 28%- reassessment of LV  function 2011 with LVEf 45-50%  . History of cardiac cath 05/2007    normal-with patent coronaries  . PAD (peripheral artery disease) (HCC)     lower extremities with ABIs of 0.5 bilaterally  . History of colonoscopy   . Diabetes mellitus without complication ([place])   . CHF (congestive heart failure) ([place])   . AICD (automatic cardioverter/defibrillator) present 10/08/2014    SUBQ    /    DR [name]  . Shortness of breath dyspnea   . History of hiatal hernia     Past Surgical History  Procedure Laterality Date  . Total abdominal hysterectomy    . Thyroidectomy    . Tonsillectomy    . Ep implantable device N/A 10/08/2014    Procedure: SubQ ICD Implant;  Surgeon: [name], MD;  Location: [facility];  Service: Cardiovascular;  Laterality: N/A;    Social History   Social History  . Marital Status: Divorced    Spouse Name: N/A  . Number of Children: 1  . Years of Education: N/A   Occupational History  . Retired     Education officer, museum   Social History Main Topics  . Smoking status: Current Every Day Smoker -- 0.25 packs/day for 40 years    Types: Cigarettes  . Smokeless tobacco: Never Used  . Alcohol Use: No  . Drug Use: No  . Sexual Activity: Not Currently   Other Topics Concern  . Not on file   Social History Narrative   Retired Education officer, museum   Divorced - one grown son   current smoker    Alcohol use-no  Drug use-no    Regular exercise-yes        son - Yunique Coulson    Family History  Problem Relation Age of Onset  . Diabetes type II Mother   . Hypertension Mother   . Diabetes Mother   . Heart disease Mother   . Other Father     deceased from accident age 65  . Hyperlipidemia Father     Current Outpatient Prescriptions on File Prior to Visit  Medication Sig Dispense Refill  . aspirin 81 MG tablet Take 81 mg by mouth daily.      Marland Kitchen atorvastatin (LIPITOR) 40 MG tablet take 1 tablet by mouth once daily 90 tablet 1  . carvedilol (COREG) 25 MG  tablet take 1 tablet by mouth twice a day with food 180 tablet 1  . furosemide (LASIX) 40 MG tablet Take 1 tablet (40 mg total) by mouth 2 (two) times daily. 60 tablet 3  . hydrALAZINE (APRESOLINE) 100 MG tablet Take 1 tablet (100 mg total) by mouth 3 (three) times daily. 90 tablet 3  . irbesartan (AVAPRO) 150 MG tablet Take 1 tablet (150 mg total) by mouth 2 (two) times daily. 60 tablet 1  . isosorbide mononitrate (IMDUR) 30 MG 24 hr tablet Take 2 tablets (60 mg total) by mouth daily. 180 tablet 1  . levothyroxine (SYNTHROID, LEVOTHROID) 125 MCG tablet take 2 tablets by mouth once daily BEFORE BREAKFAST 60 tablet 2  . metFORMIN (GLUCOPHAGE) 500 MG tablet Take 1 tablet (500 mg total) by mouth 2 (two) times daily with a meal. 180 tablet 1  . spironolactone (ALDACTONE) 50 MG tablet Take 1 tablet (50 mg total) by mouth daily. 30 tablet 11   No current facility-administered medications on file prior to visit.     3.) Review of functional ability and level of safety:  Any difficulty hearing?  NO  History of falling?  NO  Any trouble with IADLs - using a phone, using transportation, grocery shopping, preparing meals, doing housework, doing laundry, taking medications and managing money? NO  Advance Directives? Yes  See summary of recommendations in Patient Instructions below.  4.) Physical Exam Filed Vitals:   06/02/15 0900  BP: 152/80  Pulse: 82  Temp: 97.6 F (36.4 C)   Estimated body mass index is 33.43 kg/(m^2) as calculated from the following:   Height as of this encounter: 5' 9.75" (1.772 m).   Weight as of this encounter: 231 lb 6.4 oz (104.962 kg).  EKG (optional): deferred  General: alert, appear well hydrated and in no acute distress  HEENT: visual acuity grossly intact  CV: HRRR, no LE edema  Lungs: CTA bilaterally  Psych: pleasant and cooperative, no obvious depression or anxiety  Cognitive function grossly intact  FOOT exam: see chart, minimal TTP over  bony dorsal foot in area where top of shoe rubs, no redness, swelling or loss of ROM  See patient instructions for recommendations.  Education and counseling regarding the above review of health provided with a plan for the following: -see scanned patient completed form for further details -fall prevention strategies discussed  -healthy lifestyle discussed -importance and resources for completing advanced directives discussed -see patient instructions below for any other recommendations provided  4)The following written screening schedule of preventive measures were reviewed with assessment and plan made per below, orders and patient instructions:          Alcohol screening done     Obesity Screening and counseling done  STI screening (Hep C if born [birth year]) offered and per pt wishes     Tobacco Screening done        Pneumococcal (PPSV23 -one dose after 64, one before if risk factors), influenza yearly and hepatitis B vaccines (if high risk - end stage renal disease, IV drugs, homosexual men, live in home for mentally retarded, hemophilia receiving factors) ASSESSMENT/PLAN: done       Screening mammograph (yearly if >40) ASSESSMENT/PLAN: utd       Screening Pap smear/pelvic exam (q2 years) ASSESSMENT/PLAN: n/a, declined      Colorectal cancer screening (FOBT yearly or flex sig q4y or colonoscopy q10y or barium enema q4y) ASSESSMENT/PLAN: discussed, she plans to check on cost of cologuard, concerned with risks of colonosocpy      Diabetes outpatient self-management training services ASSESSMENT/PLAN: done      Bone mass measurements(covered q2y if indicated - estrogen def, osteoporosis, hyperparathyroid, vertebral abnormalities, osteoporosis or steroids) ASSESSMENT/PLAN: utd per HM       Screening for glaucoma(q1y if high risk - diabetes, FH, AA and > 50 or hispanic and > 65) ASSESSMENT/PLAN: advised, she agrees to schedule      Medical nutritional therapy for individuals  with diabetes or renal disease ASSESSMENT/PLAN: see orders      Cardiovascular screening blood tests (lipids q5y) ASSESSMENT/PLAN: see orders and labs      Diabetes screening tests ASSESSMENT/PLAN: see orders and labs   7.) Summary:   Medicare annual wellness visit, subsequent - Plan: Hep C Antibody -risk factors and conditions per above assessment were discussed and treatment, recommendations and referrals were offered per documentation above and orders and patient instructions.  Type 2 diabetes mellitus with diabetic polyneuropathy, without long-term current use of insulin (HCC) -lifestyle recs, labs ordered by PCP and advised her to get today -diabetic foot exam done -advised eye exam, she agreed to schedule  Hyperlipidemia -labs advised, lifestyle recs  Essential hypertension -elevated today, she did not take her medications and admits misses doses frequently -discussed at length the risks of continued poor control and the importance of medication compliance, discussed strategies to help -she plans to try pill box and alarm on phone -advised recheck with nurse in 1 week -labs ordered by PCP  Nonischemic cardiomyopathy ([place]) Chronic systolic heart failure ([place]) -managed by her cardiologist -stress importance of BP control and a healthy lifestyle  Cigarette smoker -advised to quit, offered help, counseled for 3-5 minutes  I think her foot pain is due to compression from choes. Advised change in footwear and to call if persists.  Patient Instructions  BEFORE YOU LEAVE: -Labs -Schedule nurse visit to check your blood pressure in 1 week -Schedule follow up in 4 months  Please get a pillbox and set a repeating alarm on your phone and take all of your blood pressure medications every day.  Quit smoking  Call today to schedule your diabetic eye exam  Check on the cost of the Cologuard test for colon cancer screening and let us know if you wish to do this.  Please  bring in a copy of your advance directives.  Change your footwear as we discussed and let us know if your foot pain continues.  We recommend the following healthy lifestyle measures: - eat a healthy whole foods diet consisting of regular small meals composed of vegetables, fruits, beans, nuts, seeds, healthy meats such as white chicken and fish and whole grains.  - avoid sweets, white starchy foods, fried foods, fast food, processed  foods, sodas, red meet and other fattening foods.  - get a least 150-300 minutes of aerobic exercise per week.   -We have ordered labs or studies at this visit. It can take up to 1-2 weeks for results and processing. We will contact you with instructions IF your results are abnormal. Normal results will be released to your [facility]. If you have not heard from [place] or can not find your results in [facility] in 2 weeks please contact our office.          Marland Kitchen

## 2015-06-02 NOTE — Patient Instructions (Signed)
BEFORE YOU LEAVE: -Labs -Schedule nurse visit to check your blood pressure in 1 week -Schedule follow up in 4 months  Please get a pillbox and set a repeating alarm on your phone and take all of your blood pressure medications every day.  Quit smoking  Call today to schedule your diabetic eye exam  Check on the cost of the Cologuard test for colon cancer screening and let us know if you wish to do this.  Please bring in a copy of your advance directives.  Change your footwear as we discussed and let us know if your foot pain continues.  We recommend the following healthy lifestyle measures: - eat a healthy whole foods diet consisting of regular small meals composed of vegetables, fruits, beans, nuts, seeds, healthy meats such as white chicken and fish and whole grains.  - avoid sweets, white starchy foods, fried foods, fast food, processed foods, sodas, red meet and other fattening foods.  - get a least 150-300 minutes of aerobic exercise per week.   -We have ordered labs or studies at this visit. It can take up to 1-2 weeks for results and processing. We will contact you with instructions IF your results are abnormal. Normal results will be released to your [facility]. If you have not heard from [place] or can not find your results in [facility] in 2 weeks please contact our office.          Marland Kitchen

## 2015-06-02 NOTE — Progress Notes (Signed)
Pre visit review using our clinic review tool, if applicable. No additional management support is needed unless otherwise documented below in the visit note. 

## 2015-06-02 NOTE — Addendum Note (Signed)
Addended by: Gari Crown D on: 06/02/2015 10:07 AM   Modules accepted: Orders

## 2015-06-03 ENCOUNTER — Encounter: Payer: Self-pay | Admitting: Family Medicine

## 2015-06-03 LAB — HEPATITIS C ANTIBODY: HCV AB: NEGATIVE

## 2015-06-04 ENCOUNTER — Other Ambulatory Visit: Payer: Self-pay | Admitting: *Deleted

## 2015-06-04 MED ORDER — ATORVASTATIN CALCIUM 40 MG PO TABS: 40.0000 mg | ORAL_TABLET | Freq: Every day | ORAL | Status: DC

## 2015-06-04 NOTE — Telephone Encounter (Signed)
Rx done and the pt was informed via Mychart message. 

## 2015-06-06 ENCOUNTER — Encounter: Payer: Self-pay | Admitting: Cardiology

## 2015-06-06 LAB — CUP PACEART REMOTE DEVICE CHECK
Battery Remaining Percentage: 94 %
Date Time Interrogation Session: 20170215120200
Implantable Lead Implant Date: 20160707
Implantable Lead Location: 753858
Implantable Lead Model: 3401
MDC IDC PG SERIAL: 115852

## 2015-06-06 NOTE — Progress Notes (Signed)
Normal remote reviewed.  Next [place] 08/18/15

## 2015-06-07 ENCOUNTER — Other Ambulatory Visit: Payer: Self-pay | Admitting: Internal Medicine

## 2015-06-07 DIAGNOSIS — E785 Hyperlipidemia, unspecified: Secondary | ICD-10-CM

## 2015-06-09 ENCOUNTER — Ambulatory Visit (INDEPENDENT_AMBULATORY_CARE_PROVIDER_SITE_OTHER): Payer: Medicare Other | Admitting: *Deleted

## 2015-06-09 ENCOUNTER — Encounter: Payer: Self-pay | Admitting: *Deleted

## 2015-06-09 VITALS — BP 170/92 | HR 81

## 2015-06-09 DIAGNOSIS — I10 Essential (primary) hypertension: Secondary | ICD-10-CM

## 2015-06-09 NOTE — Progress Notes (Signed)
Pt here for BP check with nurse and BP elevated. Per assistant, did not take her medications this morning except for one. Asymptomatic. Could not stay for doctor appt with me as has another appt to go to. Staff to advise to take all medication daily, recheck with me or her cardiologist tomorrow after taking all morning medications.

## 2015-06-10 ENCOUNTER — Encounter: Payer: Self-pay | Admitting: Family Medicine

## 2015-06-10 ENCOUNTER — Ambulatory Visit (INDEPENDENT_AMBULATORY_CARE_PROVIDER_SITE_OTHER): Payer: Medicare Other | Admitting: Family Medicine

## 2015-06-10 VITALS — BP 132/78 | HR 87 | Temp 97.7°F | Ht 69.75 in | Wt 228.1 lb

## 2015-06-10 DIAGNOSIS — I10 Essential (primary) hypertension: Secondary | ICD-10-CM | POA: Diagnosis not present

## 2015-06-10 DIAGNOSIS — E1140 Type 2 diabetes mellitus with diabetic neuropathy, unspecified: Secondary | ICD-10-CM

## 2015-06-10 DIAGNOSIS — E785 Hyperlipidemia, unspecified: Secondary | ICD-10-CM | POA: Diagnosis not present

## 2015-06-10 NOTE — Progress Notes (Signed)
HPI:   Paula Stein is a very pleasant 70 year old with an extensive cardiac history, followed by her cardiologist, hypertension, diabetes, hyperlipidemia, hypothyroidism and acid reflux here for a follow-up on her blood pressure. She came in for a blood pressure check yesterday, but had not taken any of her medications. Her blood pressure was elevated. She has a history of poor compliance with her medications. We discussed various measures to assist including an alarm on her phone and a pillbox. She now reports she is doing better with taking her medications. She took all of her medicines this morning. She reports she has started exercising and is working on eating healthier and has lost several pounds. Her primary doctor referred her to the lipid clinic for her cholesterol. Denies chest pain, shortness of breath, headaches or vision changes. She has a follow-up with her cardiologist later this month.  She reports she is scheduled for diabetic eye exam. ROS: See pertinent positives and negatives per HPI.  Past Medical History  Diagnosis Date  . Hypertension   . Personal history of goiter   . S/P thyroidectomy   . S/P radioactive iodine thyroid ablation   . Iatrogenic thyroiditis   . Non-ischemic cardiomyopathy ([place])     EF 28%- reassessment of LV function 2011 with LVEf 45-50%  . History of cardiac cath 05/2007    normal-with patent coronaries  . PAD (peripheral artery disease) (HCC)     lower extremities with ABIs of 0.5 bilaterally  . History of colonoscopy   . Diabetes mellitus without complication ([place])   . CHF (congestive heart failure) ([place])   . AICD (automatic cardioverter/defibrillator) present 10/08/2014    SUBQ    /    DR [name]  . Shortness of breath dyspnea   . History of hiatal hernia     Past Surgical History  Procedure Laterality Date  . Total abdominal hysterectomy    . Thyroidectomy    . Tonsillectomy    . Ep implantable device N/A 10/08/2014    Procedure: SubQ ICD  Implant;  Surgeon: [name], MD;  Location: [place] [facility];  Service: Cardiovascular;  Laterality: N/A;    Family History  Problem Relation Age of Onset  . Diabetes type II Mother   . Hypertension Mother   . Diabetes Mother   . Heart disease Mother   . Other Father     deceased from accident age 71  . Hyperlipidemia Father     Social History   Social History  . Marital Status: Divorced    Spouse Name: N/A  . Number of Children: 1  . Years of Education: N/A   Occupational History  . Retired     Education officer, museum   Social History Main Topics  . Smoking status: Current Every Day Smoker -- 0.25 packs/day for 40 years    Types: Cigarettes  . Smokeless tobacco: Never Used  . Alcohol Use: No  . Drug Use: No  . Sexual Activity: Not Currently   Other Topics Concern  . None   Social History Narrative   Retired Education officer, museum   Divorced - one grown son   current smoker    Alcohol use-no      Drug use-no    Regular exercise-yes        son Anayat Nolt     Current outpatient prescriptions:  .  aspirin 81 MG tablet, Take 81 mg by mouth daily.  , Disp: , Rfl:  .  atorvastatin (  LIPITOR) 40 MG tablet, Take 1 tablet (40 mg total) by mouth daily., Disp: 90 tablet, Rfl: 0 .  carvedilol (COREG) 25 MG tablet, take 1 tablet by mouth twice a day with food, Disp: 180 tablet, Rfl: 1 .  furosemide (LASIX) 40 MG tablet, Take 1 tablet (40 mg total) by mouth 2 (two) times daily., Disp: 60 tablet, Rfl: 3 .  hydrALAZINE (APRESOLINE) 100 MG tablet, Take 1 tablet (100 mg total) by mouth 3 (three) times daily., Disp: 90 tablet, Rfl: 3 .  irbesartan (AVAPRO) 150 MG tablet, Take 1 tablet (150 mg total) by mouth 2 (two) times daily., Disp: 60 tablet, Rfl: 1 .  isosorbide mononitrate (IMDUR) 30 MG 24 hr tablet, Take 2 tablets (60 mg total) by mouth daily., Disp: 180 tablet, Rfl: 1 .  levothyroxine (SYNTHROID, LEVOTHROID) 125 MCG tablet, take 2 tablets by mouth once daily BEFORE BREAKFAST,  Disp: 60 tablet, Rfl: 2 .  metFORMIN (GLUCOPHAGE) 500 MG tablet, Take 1 tablet (500 mg total) by mouth 2 (two) times daily with a meal., Disp: 180 tablet, Rfl: 1 .  spironolactone (ALDACTONE) 50 MG tablet, Take 1 tablet (50 mg total) by mouth daily., Disp: 30 tablet, Rfl: 11  EXAM:  Filed Vitals:   06/10/15 1013  BP: 132/78  Pulse: 87  Temp: 97.7 F (36.5 C)    Body mass index is 32.95 kg/(m^2).  GENERAL: vitals reviewed and listed above, alert, oriented, appears well hydrated and in no acute distress  HEENT: atraumatic, conjunttiva clear, no obvious abnormalities on inspection of external nose and ears  NECK: no obvious masses on inspection  LUNGS: clear to auscultation bilaterally, no wheezes, rales or rhonchi, good air movement  CV: HRRR, no peripheral edema  MS: moves all extremities without noticeable abnormality  PSYCH: pleasant and cooperative, no obvious depression or anxiety  ASSESSMENT AND PLAN:  Discussed the following assessment and plan:  Essential hypertension  Hyperlipidemia  Type 2 diabetes mellitus with diabetic neuropathy, without long-term current use of insulin (HCC)  -Her blood pressure looks much better today -Stressed importance of medication compliance and congratulated her on improvement -Congratulated her on lifestyle changes and advised that she continue a healthy diet and regular activity -Again, reviewed health maintenance measures and she agrees to let us know regarding her thoughts on colon cancer screening options -Patient advised to return or notify a doctor immediately if symptoms worsen or persist or new concerns arise.  Patient Instructions  Continue to take your medications, using the alarm and the pillbox.   Chief your follow-up with her cardiologist as scheduled.  Congratulations on working towards a healthier lifestyle.  We recommend the following healthy lifestyle measures: - eat a healthy whole foods diet consisting of  regular small meals composed of vegetables, fruits, beans, nuts, seeds, healthy meats such as white chicken and fish and whole grains.  - avoid sweets, white starchy foods, fried foods, fast food, processed foods, sodas, red meet and other fattening foods.  - get a least 150-300 minutes of aerobic exercise per week.   Please call your insurance company today to check on options for colon cancer screening and let us know what you would like to do.  Follow-up as scheduled and as needed.     Colin Benton R.

## 2015-06-10 NOTE — Progress Notes (Signed)
Pre visit review using our clinic review tool, if applicable. No additional management support is needed unless otherwise documented below in the visit note. 

## 2015-06-10 NOTE — Patient Instructions (Addendum)
Continue to take your medications, using the alarm and the pillbox.   Chief your follow-up with her cardiologist as scheduled.  Congratulations on working towards a healthier lifestyle.  We recommend the following healthy lifestyle measures: - eat a healthy whole foods diet consisting of regular small meals composed of vegetables, fruits, beans, nuts, seeds, healthy meats such as white chicken and fish and whole grains.  - avoid sweets, white starchy foods, fried foods, fast food, processed foods, sodas, red meet and other fattening foods.  - get a least 150-300 minutes of aerobic exercise per week.   Please call your insurance company today to check on options for colon cancer screening and let us know what you would like to do.  Follow-up as scheduled and as needed.

## 2015-06-14 ENCOUNTER — Other Ambulatory Visit: Payer: Self-pay | Admitting: Internal Medicine

## 2015-06-14 DIAGNOSIS — E785 Hyperlipidemia, unspecified: Secondary | ICD-10-CM

## 2015-06-16 ENCOUNTER — Other Ambulatory Visit: Payer: Self-pay | Admitting: Internal Medicine

## 2015-06-16 DIAGNOSIS — E785 Hyperlipidemia, unspecified: Secondary | ICD-10-CM

## 2015-06-21 ENCOUNTER — Ambulatory Visit: Payer: Medicare Other | Admitting: Pharmacist

## 2015-06-28 ENCOUNTER — Ambulatory Visit (INDEPENDENT_AMBULATORY_CARE_PROVIDER_SITE_OTHER): Payer: Medicare Other | Admitting: Cardiovascular Disease

## 2015-06-28 ENCOUNTER — Encounter: Payer: Self-pay | Admitting: Cardiovascular Disease

## 2015-06-28 VITALS — BP 140/62 | HR 80 | Ht 70.0 in | Wt 225.0 lb

## 2015-06-28 DIAGNOSIS — E785 Hyperlipidemia, unspecified: Secondary | ICD-10-CM

## 2015-06-28 DIAGNOSIS — I428 Other cardiomyopathies: Secondary | ICD-10-CM

## 2015-06-28 DIAGNOSIS — I429 Cardiomyopathy, unspecified: Secondary | ICD-10-CM | POA: Diagnosis not present

## 2015-06-28 DIAGNOSIS — I10 Essential (primary) hypertension: Secondary | ICD-10-CM | POA: Diagnosis not present

## 2015-06-28 MED ORDER — CARVEDILOL 25 MG PO TABS: 25.0000 mg | ORAL_TABLET | Freq: Two times a day (BID) | ORAL | Status: DC

## 2015-06-28 MED ORDER — ATORVASTATIN CALCIUM 40 MG PO TABS: 40.0000 mg | ORAL_TABLET | Freq: Every day | ORAL | Status: DC

## 2015-06-28 MED ORDER — VALSARTAN 160 MG PO TABS: 160.0000 mg | ORAL_TABLET | Freq: Two times a day (BID) | ORAL | Status: DC

## 2015-06-28 MED ORDER — FUROSEMIDE 40 MG PO TABS: 40.0000 mg | ORAL_TABLET | Freq: Two times a day (BID) | ORAL | Status: DC

## 2015-06-28 MED ORDER — ISOSORBIDE MONONITRATE ER 30 MG PO TB24: 60.0000 mg | ORAL_TABLET | Freq: Every day | ORAL | Status: DC

## 2015-06-28 NOTE — Patient Instructions (Signed)
Medication Instructions:  Your physician has recommended you make the following change in your medication:  1. STOP Avapro 2. START Diovan 160mg  take one tablet by mouth twice a day  Labwork: No new orders.   Testing/Procedures: Your physician has requested that you have an echocardiogram in 1 YEAR (one week prior to appointment with Dr Burt Knack). Echocardiography is a painless test that uses sound waves to create images of your heart. It provides your doctor with information about the size and shape of your heart and how well your heart's chambers and valves are working. This procedure takes approximately one hour. There are no restrictions for this procedure.  Follow-Up: Your physician wants you to follow-up in: 1 YEAR with Dr Burt Knack.  You will receive a reminder letter in the mail two months in advance. If you don't receive a letter, please call our office to schedule the follow-up appointment.   Any Other Special Instructions Will Be Listed Below (If Applicable).     If you need a refill on your cardiac medications before your next appointment, please call your pharmacy.

## 2015-06-28 NOTE — Progress Notes (Signed)
Cardiology Office Note Date:  06/28/2015   ID:  Paula Stein, DOB [birth year], MRN [id number]  PCP:  Paula Pry, DO  Cardiologist:  Paula Mocha, MD    Chief Complaint  Patient presents with  . Hypertension    c/o sob with extended walking. denies cp, le edema, or claudication  . Hyperlipidemia    History of Present Illness: Paula Stein is a 70 y.o. female who presents for follow-up of nonischemic cardiomyopathy and chronic systolic heart failure. The patient has undergone cardiac catheterization demonstrating nonobstructive coronary artery disease. She is also followed for lower extremity peripheral arterial disease. Her last echocardiogram in 2016 showed an LVEF of 30-35% without significant valvular patient has undergone subcutaneous ICD implantation.  The patient is doing well. She's had no device discharges. She is trying hard to quit smoking. She's down to a rare cigarette - states a pack is lasting her 2 weeks.   Today, she denies symptoms of palpitations, chest pain,, orthopnea, PND, lower extremity edema, dizziness, or syncope. She is short of breath with walking in the grocery store. Breathing is improved over the past year. She's eating better and now doing Senior aerobics.    Past Medical History  Diagnosis Date  . Hypertension   . Personal history of goiter   . S/P thyroidectomy   . S/P radioactive iodine thyroid ablation   . Iatrogenic thyroiditis   . Non-ischemic cardiomyopathy ([place])     EF 28%- reassessment of LV function 2011 with LVEf 45-50%  . History of cardiac cath 05/2007    normal-with patent coronaries  . PAD (peripheral artery disease) (HCC)     lower extremities with ABIs of 0.5 bilaterally  . History of colonoscopy   . Diabetes mellitus without complication ([place])   . CHF (congestive heart failure) ([place])   . AICD (automatic cardioverter/defibrillator) present 10/08/2014    SUBQ    /    DR Paula Stein  . Shortness of breath dyspnea   . History of  hiatal hernia     Past Surgical History  Procedure Laterality Date  . Total abdominal hysterectomy    . Thyroidectomy    . Tonsillectomy    . Ep implantable device N/A 10/08/2014    Procedure: SubQ ICD Implant;  Surgeon: Paula Sprang, MD;  Location: [facility];  Service: Cardiovascular;  Laterality: N/A;    Current Outpatient Prescriptions  Medication Sig Dispense Refill  . aspirin 81 MG tablet Take 81 mg by mouth daily.      Marland Kitchen atorvastatin (LIPITOR) 40 MG tablet Take 1 tablet (40 mg total) by mouth daily. 90 tablet 3  . carvedilol (COREG) 25 MG tablet Take 1 tablet (25 mg total) by mouth 2 (two) times daily with a meal. 180 tablet 3  . furosemide (LASIX) 40 MG tablet Take 1 tablet (40 mg total) by mouth 2 (two) times daily. 180 tablet 3  . hydrALAZINE (APRESOLINE) 50 MG tablet Take 100 mg by mouth 3 (three) times daily. Take 2 tablets by mouth three times daily    . isosorbide mononitrate (IMDUR) 30 MG 24 hr tablet Take 2 tablets (60 mg total) by mouth daily. 180 tablet 3  . levothyroxine (SYNTHROID, LEVOTHROID) 125 MCG tablet take 2 tablets by mouth once daily BEFORE BREAKFAST 60 tablet 2  . metFORMIN (GLUCOPHAGE) 500 MG tablet Take 1 tablet (500 mg total) by mouth 2 (two) times daily with a meal. 180 tablet 1  . spironolactone (ALDACTONE) 50 MG tablet  Take 1 tablet (50 mg total) by mouth daily. 30 tablet 11  . valsartan (DIOVAN) 160 MG tablet Take 1 tablet (160 mg total) by mouth 2 (two) times daily. 180 tablet 3   No current facility-administered medications for this visit.   Allergies:   Ibuprofen   Social History:  The patient  reports that she has been smoking Cigarettes.  She has a 10 pack-year smoking history. She has never used smokeless tobacco. She reports that she does not drink alcohol or use illicit drugs.   Family History:  The patient's  family history includes Diabetes in her mother; Diabetes type II in her mother; Heart disease in her mother; Hyperlipidemia  in her father; Hypertension in her mother; Other in her father.   ROS:  Please see the history of present illness. All other systems are reviewed and negative.    PHYSICAL EXAM: VS:  BP 140/62 mmHg  Pulse 80  Ht 5\' 10"  (1.778 m)  Wt 225 lb (102.059 kg)  BMI 32.28 kg/m2 , BMI Body mass index is 32.28 kg/(m^2). GEN: Well nourished, well developed, in no acute distress HEENT: normal Neck: no JVD, no masses. No carotid bruits Cardiac: RRR without murmur or gallop                Respiratory:  clear to auscultation bilaterally, normal work of breathing GI: soft, nontender, nondistended, + BS MS: no deformity or atrophy Ext: no pretibial edema, pedal pulses 2+= bilaterally Skin: warm and dry, no rash Neuro:  Strength and sensation are intact Psych: euthymic mood, full affect  EKG:  EKG is not ordered today.  Recent Labs: 10/01/2014: Hemoglobin 12.2; Platelets 261.0 06/02/2015: ALT 10; BUN 16; Creatinine, Ser 1.00; Potassium 4.4; Sodium 139; TSH 1.60   Lipid Panel     Component Value Date/Time   CHOL 262* 06/02/2015 1007   TRIG 159.0* 06/02/2015 1007   HDL 53.80 06/02/2015 1007   CHOLHDL 5 06/02/2015 1007   VLDL 31.8 06/02/2015 1007   LDLCALC 177* 06/02/2015 1007   LDLDIRECT 195.2 02/15/2011 1625      Wt Readings from Last 3 Encounters:  06/28/15 225 lb (102.059 kg)  06/10/15 228 lb 1.6 oz (103.465 kg)  06/02/15 231 lb 6.4 oz (104.962 kg)     Cardiac Studies Reviewed: 2-D echocardiogram 07/22/2014: Study Conclusions  - Left ventricle: The cavity size was normal. There was moderate  concentric hypertrophy. Systolic function was moderately to  severely reduced. The estimated ejection fraction was in the  range of 30% to 35%. Moderate diffuse hypokinesis with no  identifiable regional variations. Doppler parameters are  consistent with abnormal left ventricular relaxation (grade 1  diastolic dysfunction). Doppler parameters are consistent with  elevated mean left  atrial filling pressure. - Ventricular septum: Septal motion showed paradox. - Mitral valve: There was mild regurgitation. The acceleration rate  of the regurgitant jet was reduced, consistent with a low dP/dt. - Left atrium: The atrium was moderately to severely dilated. - Right ventricle: Systolic function was moderately reduced.   ASSESSMENT AND PLAN: 1.  Chronic systolic heart failure, NYHA class II: Patient is doing well at present and her symptoms are improved from last year. Avapro is not on her insurance formulary, so will change her to valsartan 160 mg twice daily. Otherwise she will continue carvedilol, Aldactone, hydralazine, and isosorbide. I will see her back in one year with an echocardiogram prior to that visit.  2. Hypertension with heart failure: Blood pressure is better controlled on  her current regimen. She understands the importance of sodium restriction.  3. Lower extremity peripheral arterial disease: No claudication symptoms. Will follow clinically.  4. Hyperlipidemia: Recent lipids were well above goal. She was off of atorvastatin and has restarted it. Follow-up labs are pending per her PCP. She will be referred to the lipid clinic if she is still above goal after restarting atorvastatin.  5. Tobacco abuse: Cessation counseling done.  Current medicines are reviewed with the patient today.  The patient does not have concerns regarding medicines.  Labs/ tests ordered today include:   Orders Placed This Encounter  Procedures  . Echocardiogram    Disposition:   FU one year with an echo prior to that visit  Signed, Paula Mocha, MD  06/28/2015 12:37 PM    [address], [place], [place]  [address] Phone: [phone]; Fax: [phone]

## 2015-07-07 ENCOUNTER — Encounter: Payer: Self-pay | Admitting: Family Medicine

## 2015-07-07 ENCOUNTER — Other Ambulatory Visit: Payer: Self-pay | Admitting: *Deleted

## 2015-07-07 MED ORDER — LEVOTHYROXINE SODIUM 125 MCG PO TABS: ORAL_TABLET | ORAL | Status: DC

## 2015-07-07 NOTE — Telephone Encounter (Signed)
I called the pt and informed her of the message below and she stated she did receive the letter about Dr Shawna Orleans and wanted to have Dr Maudie Mercury continue seeing her.  I sent refills for the thyroid medication to her pharmacy and she is aware Dr Burt Knack sent refills for the Carvedilol on 3/27.  I scheduled the pt an establish care visit with Dr Maudie Mercury in 3 months.

## 2015-07-07 NOTE — Telephone Encounter (Signed)
See Mychart note 

## 2015-08-18 ENCOUNTER — Ambulatory Visit (INDEPENDENT_AMBULATORY_CARE_PROVIDER_SITE_OTHER): Payer: Medicare Other | Admitting: *Deleted

## 2015-08-18 DIAGNOSIS — I429 Cardiomyopathy, unspecified: Secondary | ICD-10-CM

## 2015-08-18 DIAGNOSIS — I428 Other cardiomyopathies: Secondary | ICD-10-CM

## 2015-08-18 NOTE — Progress Notes (Signed)
Remote ICD transmission.   

## 2015-09-08 ENCOUNTER — Encounter: Payer: Self-pay | Admitting: Cardiology

## 2015-09-09 ENCOUNTER — Ambulatory Visit (INDEPENDENT_AMBULATORY_CARE_PROVIDER_SITE_OTHER): Payer: Medicare Other | Admitting: Physician Assistant

## 2015-09-09 DIAGNOSIS — R030 Elevated blood-pressure reading, without diagnosis of hypertension: Secondary | ICD-10-CM

## 2015-09-09 DIAGNOSIS — M79602 Pain in left arm: Secondary | ICD-10-CM

## 2015-09-09 DIAGNOSIS — IMO0001 Reserved for inherently not codable concepts without codable children: Secondary | ICD-10-CM

## 2015-09-09 LAB — CUP PACEART REMOTE DEVICE CHECK
Date Time Interrogation Session: 20170517115700
Implantable Lead Implant Date: 20160707
Implantable Lead Model: 3401
MDC IDC LEAD LOCATION: 753858
MDC IDC MSMT BATTERY REMAINING PERCENTAGE: 91 %
Pulse Gen Serial Number: 115852
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
## 2015-09-09 NOTE — Patient Instructions (Addendum)
Tylenol and heat. Gentle range of motion exercises. Return if your pain gets worse than muscle soreness.  Go home and take your blood pressure medicine. Take your blood pressure tonight -- if still high, you need to make follow up appt with your primary    IF you received an x-ray today, you will receive an invoice from [facility]. Please contact [facility] at [phone] with questions or concerns regarding your invoice.   IF you received labwork today, you will receive an invoice from [employer]. Please contact [facility] at [phone] with questions or concerns regarding your invoice.   Our billing staff will not be able to assist you with questions regarding bills from these companies.  You will be contacted with the lab results as soon as they are available. The fastest way to get your results is to activate your My Chart account. Instructions are located on the last page of this paperwork. If you have not heard from [place] regarding the results in 2 weeks, please contact this office.

## 2015-09-16 ENCOUNTER — Encounter: Payer: Self-pay | Admitting: Internal Medicine

## 2015-10-11 ENCOUNTER — Ambulatory Visit: Payer: Medicare Other | Admitting: Family Medicine

## 2015-10-14 ENCOUNTER — Ambulatory Visit: Payer: Medicare Other | Admitting: Family Medicine

## 2015-11-26 ENCOUNTER — Encounter: Payer: Self-pay | Admitting: Internal Medicine

## 2015-11-26 ENCOUNTER — Ambulatory Visit (INDEPENDENT_AMBULATORY_CARE_PROVIDER_SITE_OTHER): Payer: Medicare Other | Admitting: Internal Medicine

## 2015-11-26 VITALS — BP 142/80 | HR 75 | Ht 70.0 in | Wt 228.3 lb

## 2015-11-26 DIAGNOSIS — I5022 Chronic systolic (congestive) heart failure: Secondary | ICD-10-CM

## 2015-11-26 DIAGNOSIS — I428 Other cardiomyopathies: Secondary | ICD-10-CM

## 2015-11-26 DIAGNOSIS — I429 Cardiomyopathy, unspecified: Secondary | ICD-10-CM | POA: Diagnosis not present

## 2015-11-26 DIAGNOSIS — Z95810 Presence of automatic (implantable) cardiac defibrillator: Secondary | ICD-10-CM

## 2015-11-26 DIAGNOSIS — I10 Essential (primary) hypertension: Secondary | ICD-10-CM

## 2015-11-26 NOTE — Progress Notes (Signed)
Patient Care Team: Lucretia Kern, DO as PCP - General (Family Medicine)   HPI  Paula Stein is a 70 y.o. 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 At that point she recently been started on ARB and is on Aldactone. We added nitrates to her hydralazine.  Repeat echocardiogram 4/16 confirmed Ejection fractions in the 30-35% range with moderate MR. She also severe LVH and left atrial dilatation  She has stable class 2-  Symptoms    BP at home mostly 150/70s  She has stopped smoking. Her breathlessness is better. She has no edema.  No Chest pan     Past Medical History:  Diagnosis Date  . AICD (automatic cardioverter/defibrillator) present 10/08/2014   SUBQ    /    DR [name]  . CHF (congestive heart failure) ([place])   . Diabetes mellitus without complication ([place])   . History of cardiac cath 05/2007   normal-with patent coronaries  . History of colonoscopy   . History of hiatal hernia   . Hypertension   . Iatrogenic thyroiditis   . Non-ischemic cardiomyopathy ([place])    EF 28%- reassessment of LV function 2011 with LVEf 45-50%  . PAD (peripheral artery disease) (HCC)    lower extremities with ABIs of 0.5 bilaterally  . Personal history of goiter   . S/P radioactive iodine thyroid ablation   . S/P thyroidectomy   . Shortness of breath dyspnea     Past Surgical History:  Procedure Laterality Date  . EP IMPLANTABLE DEVICE N/A 10/08/2014   Procedure: SubQ ICD Implant;  Surgeon: [name], MD;  Location: [facility];  Service: Cardiovascular;  Laterality: N/A;  . THYROIDECTOMY    . TONSILLECTOMY    . TOTAL ABDOMINAL HYSTERECTOMY      Current Outpatient Prescriptions  Medication Sig Dispense Refill  . aspirin 81 MG tablet Take 81 mg by mouth daily.      Marland Kitchen atorvastatin (LIPITOR) 40 MG tablet Take 1 tablet (40 mg  total) by mouth daily. 90 tablet 3  . carvedilol (COREG) 25 MG tablet Take 1 tablet (25 mg total) by mouth 2 (two) times daily with a meal. 180 tablet 3  . furosemide (LASIX) 40 MG tablet Take 1 tablet (40 mg total) by mouth 2 (two) times daily. 180 tablet 3  . hydrALAZINE (APRESOLINE) 50 MG tablet Take 100 mg by mouth 3 (three) times daily. Take 2 tablets by mouth three times daily    . isosorbide mononitrate (IMDUR) 30 MG 24 hr tablet Take 2 tablets (60 mg total) by mouth daily. 180 tablet 3  . levothyroxine (SYNTHROID, LEVOTHROID) 125 MCG tablet Take 2 tablets once a day before breakfast 60 tablet 3  . metFORMIN (GLUCOPHAGE) 500 MG tablet Take 1 tablet (500 mg total) by mouth 2 (two) times daily with a meal. 180 tablet 1  . spironolactone (ALDACTONE) 50 MG tablet Take 1 tablet (50 mg total) by mouth daily. 30 tablet 11  . valsartan (DIOVAN) 160 MG tablet Take 1 tablet (160 mg total) by mouth 2 (two) times daily. 180 tablet 3   No current facility-administered medications for this visit.     Allergies  Allergen Reactions  . Ibuprofen Nausea Only      Review of Systems negative except from HPI and PMH  Physical Exam BP (!) 142/80  Pulse 75   Ht 5\' 10"  (1.778 m)   Wt 228 lb 4.8 oz (103.6 kg)   SpO2 96%   BMI 32.76 kg/m  Well developed and well nourished in no acute distress HENT normal E scleral and icterus clear Neck Supple JVP flat; carotids brisk and full Clear to ausculation Device pocket well healed; without hematoma or erythema.  There is no tethering  Regular rate and rhythm, no murmurs gallops or rub Soft with active bowel sounds No clubbing cyanosis  Edema Alert and oriented, grossly normal motor and sensory function Skin Warm and Dry   Assessment and  Plan  Nonischemic cardiomyopathy  CHF  Chronic systolic  ICD-Subcutaneous   Hypertension  Sleep apnea-treated   Blood pressure remains high--  Could add amlodipine.  Another strategy would be to use  entresto  Device pocket is well-healed  She is euvolemic.  She had stopped smoking but has resumed.  But only few cigs/week     Her CPAP is not related that she is aware of ever. She will follow-up with her team to get her card rhythm

## 2015-11-26 NOTE — Patient Instructions (Addendum)
Medication Instructions: - Your physician recommends that you continue on your current medications as directed. Please refer to the Current Medication list given to you today.  - we will call you when we hear back from Dr. Burt Knack about medication adjustments.  Labwork: - none  Procedures/Testing: - none  Follow-Up: - Remote monitoring is used to monitor your Pacemaker of ICD from home. This monitoring reduces the number of office visits required to check your device to one time per year. It allows [place] to keep an eye on the functioning of your device to ensure it is working properly. You are scheduled for a device check from home on 02/29/16. You may send your transmission at any time that day. If you have a wireless device, the transmission will be sent automatically. After your physician reviews your transmission, you will receive a postcard with your next transmission date.  - Your physician wants you to follow-up in: 1 year with Dr. [name]. You will receive a reminder letter in the mail two months in advance. If you don't receive a letter, please call our office to schedule the follow-up appointment.   Any Additional Special Instructions Will Be Listed Below (If Applicable).     If you need a refill on your cardiac medications before your next appointment, please call your pharmacy.

## 2015-12-03 LAB — CUP PACEART INCLINIC DEVICE CHECK
Implantable Lead Location: 753858
Implantable Lead Model: 3401
MDC IDC LEAD IMPLANT DT: 20160707
MDC IDC SESS DTM: 20170901094554
Pulse Gen Serial Number: 115852

## 2015-12-05 ENCOUNTER — Encounter: Payer: Self-pay | Admitting: Internal Medicine

## 2015-12-12 ENCOUNTER — Encounter: Payer: Self-pay | Admitting: Internal Medicine

## 2015-12-14 ENCOUNTER — Encounter: Payer: Self-pay | Admitting: Cardiovascular Disease

## 2015-12-14 NOTE — Progress Notes (Signed)
Please start Entresto 24/26 mg BID, double dose in 2 weeks as tolerated by BP, thx

## 2015-12-15 ENCOUNTER — Encounter: Payer: Self-pay | Admitting: Internal Medicine

## 2015-12-17 ENCOUNTER — Other Ambulatory Visit: Payer: Self-pay

## 2015-12-17 MED ORDER — SACUBITRIL-VALSARTAN 24-26 MG PO TABS: 1.0000 | ORAL_TABLET | Freq: Two times a day (BID) | ORAL | 0 refills | Status: DC

## 2015-12-20 ENCOUNTER — Telehealth: Payer: Self-pay | Admitting: Internal Medicine

## 2015-12-20 ENCOUNTER — Other Ambulatory Visit: Payer: Self-pay | Admitting: Internal Medicine

## 2015-12-20 NOTE — Telephone Encounter (Signed)
Walk In pt form-Handicapped Application off Dr.Klein back in office 12/21/15.

## 2016-01-06 ENCOUNTER — Encounter: Payer: Self-pay | Admitting: Cardiovascular Disease

## 2016-01-07 ENCOUNTER — Other Ambulatory Visit: Payer: Self-pay

## 2016-01-07 MED ORDER — SACUBITRIL-VALSARTAN 49-51 MG PO TABS: 1.0000 | ORAL_TABLET | Freq: Two times a day (BID) | ORAL | 2 refills | Status: DC

## 2016-02-16 ENCOUNTER — Other Ambulatory Visit: Payer: Self-pay | Admitting: Internal Medicine

## 2016-02-16 DIAGNOSIS — I509 Heart failure, unspecified: Secondary | ICD-10-CM

## 2016-02-28 ENCOUNTER — Other Ambulatory Visit: Payer: Self-pay | Admitting: Family Medicine

## 2016-02-28 NOTE — Telephone Encounter (Signed)
Rx done and the pt was informed. 

## 2016-02-29 ENCOUNTER — Ambulatory Visit (INDEPENDENT_AMBULATORY_CARE_PROVIDER_SITE_OTHER): Payer: Medicare Other | Admitting: *Deleted

## 2016-02-29 DIAGNOSIS — I428 Other cardiomyopathies: Secondary | ICD-10-CM | POA: Diagnosis not present

## 2016-03-01 NOTE — Progress Notes (Signed)
Remote ICD transmission.   

## 2016-03-02 ENCOUNTER — Encounter: Payer: Self-pay | Admitting: Cardiology

## 2016-03-10 ENCOUNTER — Encounter: Payer: Self-pay | Admitting: Family Medicine

## 2016-03-10 ENCOUNTER — Ambulatory Visit (INDEPENDENT_AMBULATORY_CARE_PROVIDER_SITE_OTHER): Payer: Medicare Other | Admitting: Family Medicine

## 2016-03-10 VITALS — BP 136/88 | HR 91 | Temp 98.3°F | Ht 70.0 in | Wt 222.0 lb

## 2016-03-10 DIAGNOSIS — E039 Hypothyroidism, unspecified: Secondary | ICD-10-CM | POA: Diagnosis not present

## 2016-03-10 DIAGNOSIS — I5022 Chronic systolic (congestive) heart failure: Secondary | ICD-10-CM

## 2016-03-10 DIAGNOSIS — G4700 Insomnia, unspecified: Secondary | ICD-10-CM

## 2016-03-10 DIAGNOSIS — F17210 Nicotine dependence, cigarettes, uncomplicated: Secondary | ICD-10-CM

## 2016-03-10 DIAGNOSIS — E785 Hyperlipidemia, unspecified: Secondary | ICD-10-CM | POA: Diagnosis not present

## 2016-03-10 DIAGNOSIS — I10 Essential (primary) hypertension: Secondary | ICD-10-CM

## 2016-03-10 DIAGNOSIS — I428 Other cardiomyopathies: Secondary | ICD-10-CM

## 2016-03-10 DIAGNOSIS — I70219 Atherosclerosis of native arteries of extremities with intermittent claudication, unspecified extremity: Secondary | ICD-10-CM | POA: Diagnosis not present

## 2016-03-10 DIAGNOSIS — E1142 Type 2 diabetes mellitus with diabetic polyneuropathy: Secondary | ICD-10-CM

## 2016-03-10 LAB — BASIC METABOLIC PANEL
BUN: 14 mg/dL (ref 6–23)
CO2: 26 mEq/L (ref 19–32)
CREATININE: 1.02 mg/dL (ref 0.40–1.20)
Calcium: 9.6 mg/dL (ref 8.4–10.5)
Chloride: 107 mEq/L (ref 96–112)
GFR: 68.39 mL/min (ref >60.00)
Glucose, Bld: 139 mg/dL — ABNORMAL HIGH (ref 70–99)
POTASSIUM: 4.4 meq/L (ref 3.5–5.1)
Sodium: 140 mEq/L (ref 135–145)

## 2016-03-10 LAB — CBC
HCT: 42.8 % (ref 36.0–46.0)
HEMOGLOBIN: 14.1 g/dL (ref 12.0–15.0)
MCHC: 32.8 g/dL (ref 30.0–36.0)
MCV: 91.6 fl (ref 78.0–100.0)
Platelets: 278 10*3/uL (ref 150.0–400.0)
RBC: 4.68 Mil/uL (ref 3.87–5.11)
RDW: 15.6 % — AB (ref 11.5–15.5)
WBC: 6.8 10*3/uL (ref 4.0–10.5)

## 2016-03-10 LAB — TSH: TSH: 6.34 u[IU]/mL — AB (ref 0.35–4.50)

## 2016-03-10 LAB — LIPID PANEL
Cholesterol: 258 mg/dL — ABNORMAL HIGH (ref 0–200)
HDL: 58.9 mg/dL (ref >39.00)
LDL Cholesterol: 167 mg/dL — ABNORMAL HIGH (ref 0–99)
NonHDL: 199.31
TRIGLYCERIDES: 163 mg/dL — AB (ref 0.0–149.0)
Total CHOL/HDL Ratio: 4
VLDL: 32.6 mg/dL (ref 0.0–40.0)

## 2016-03-10 LAB — HEMOGLOBIN A1C: HEMOGLOBIN A1C: 7.5 % — AB (ref 4.6–6.5)

## 2016-03-10 NOTE — Progress Notes (Signed)
HPI:  Paula Stein is here to establish care. Used to see Dr. Shawna Orleans.  Has the following chronic problems that require follow up and concerns today:  Hypertension, CsCHF w/ AICD, cardiomyopathy, HLD, PAD: -hx difficult to control BP -did not take meds this morning -watches at home and lately improved to 150/80 - reports this is good for her -reports cardiologist recently added entresto -other meds:spironolactone, entrestol, isosorbide, hydralazine, lasix, coreg, asa, statin -denies cp, sob, doe, swelling  Tobacco use: -cutting back, 6-8 cigs per week when plays bingo -she does want to stop smoking and thinks she can -denies hemoptysis, sob, cough  DM: -meds: metformin bid -needs refill on metformin -has neuropathy in feet, sees podiatrist -does eye exam yearly - sees a doctor at [facility] -denies low blood sugar  Hypothyroidism -meds: synthroid  Poor sleep: -watches new to fall asleep, wakes a few times during the night -eats and drinks a lot of before bed -no snoring or apnea hat she is aware -no depression or anxiety  ROS negative for unless reported above: fevers, unintentional weight loss, hearing or vision loss, chest pain, palpitations, struggling to breath, hemoptysis, melena, hematochezia, hematuria, falls, loc, si, thoughts of self harm  Past Medical History:  Diagnosis Date  . AICD (automatic cardioverter/defibrillator) present 10/08/2014   SUBQ    /    DR [name]  . CHF (congestive heart failure) ([place])   . Diabetes mellitus without complication ([place])   . History of cardiac cath 05/2007   normal-with patent coronaries  . History of colonoscopy   . History of hiatal hernia   . Hypertension   . Iatrogenic thyroiditis   . Non-ischemic cardiomyopathy ([place])    EF 28%- reassessment of LV function 2011 with LVEf 45-50%  . PAD (peripheral artery disease) (HCC)    lower extremities with ABIs of 0.5 bilaterally  . Personal history of goiter   . S/P radioactive  iodine thyroid ablation   . S/P thyroidectomy   . Shortness of breath dyspnea     Past Surgical History:  Procedure Laterality Date  . EP IMPLANTABLE DEVICE N/A 10/08/2014   Procedure: SubQ ICD Implant;  Surgeon: [name], MD;  Location: [facility];  Service: Cardiovascular;  Laterality: N/A;  . THYROIDECTOMY    . TONSILLECTOMY    . TOTAL ABDOMINAL HYSTERECTOMY      Family History  Problem Relation Age of Onset  . Diabetes type II Mother   . Hypertension Mother   . Diabetes Mother   . Heart disease Mother   . Other Father     deceased from accident age 8  . Hyperlipidemia Father     Social History   Social History  . Marital status: Divorced    Spouse name: N/A  . Number of children: 1  . Years of education: N/A   Occupational History  . Retired Retired    Education officer, museum   Social History Main Topics  . Smoking status: Current Every Day Smoker    Packs/day: 0.25    Years: 40.00    Types: Cigarettes  . Smokeless tobacco: Never Used  . Alcohol use No  . Drug use: No  . Sexual activity: Not Currently   Other Topics Concern  . None   Social History Narrative   Retired Education officer, museum   Divorced - one grown son   current smoker    Alcohol use-no      Drug use-no    Regular exercise- no  son - Olivia Pavelko     Current Outpatient Prescriptions:  .  aspirin 81 MG tablet, Take 81 mg by mouth daily.  , Disp: , Rfl:  .  atorvastatin (LIPITOR) 40 MG tablet, Take 1 tablet (40 mg total) by mouth daily., Disp: 90 tablet, Rfl: 3 .  carvedilol (COREG) 25 MG tablet, Take 1 tablet (25 mg total) by mouth 2 (two) times daily with a meal., Disp: 180 tablet, Rfl: 3 .  furosemide (LASIX) 40 MG tablet, Take 1 tablet (40 mg total) by mouth 2 (two) times daily., Disp: 180 tablet, Rfl: 3 .  hydrALAZINE (APRESOLINE) 50 MG tablet, take 1 tablet by mouth three times a day, Disp: 270 tablet, Rfl: 1 .  isosorbide mononitrate (IMDUR) 30 MG 24 hr tablet, Take 2 tablets  (60 mg total) by mouth daily., Disp: 180 tablet, Rfl: 3 .  levothyroxine (SYNTHROID, LEVOTHROID) 125 MCG tablet, take 2 tablets by mouth once daily BEFORE BREAKFAST, Disp: 60 tablet, Rfl: 0 .  metFORMIN (GLUCOPHAGE) 500 MG tablet, Take 1 tablet (500 mg total) by mouth 2 (two) times daily with a meal., Disp: 180 tablet, Rfl: 1 .  sacubitril-valsartan (ENTRESTO) 49-51 MG, Take 1 tablet by mouth 2 (two) times daily., Disp: 60 tablet, Rfl: 2 .  spironolactone (ALDACTONE) 50 MG tablet, Take 1 tablet (50 mg total) by mouth daily., Disp: 30 tablet, Rfl: 11  EXAM:  Vitals:   03/10/16 0959  BP: 136/88  Pulse: 91  Temp: 98.3 F (36.8 C)    Body mass index is 31.85 kg/m.  GENERAL: vitals reviewed and listed above, alert, oriented, appears well hydrated and in no acute distress  HEENT: atraumatic, conjunttiva clear, no obvious abnormalities on inspection of external nose and ears  NECK: no obvious masses on inspection  LUNGS: clear to auscultation bilaterally, no wheezes, rales or rhonchi, good air movement  CV: HRRR, no peripheral edema  MS: moves all extremities without noticeable abnormality  PSYCH: pleasant and cooperative, no obvious depression or anxiety  Foot exam done  ASSESSMENT AND PLAN:  Discussed the following assessment and plan: More than 50% of over 40 minutes spent in total in caring for this patient was spent face-to-face with the patient, counseling and/or coordinating care.    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 -We provided refills for any medications we will prescribe as needed. -We addressed current concerns per orders and patient instructions. -We have asked for records for pertinent exams, studies, vaccines and notes from previous providers. -We have advised patient to follow up per instructions below. -labs -foot exam done and diabetic footwear form completed and given to assistant to fax -lifestyle recs - extensive discussion, advise healthy Mediterranean diet and starting gradual increase in gentle exercise -medicare exam in march  -breif counseling on smoking cessation and she feels she can quit -sleep hygiene counseling  -Patient advised to return or notify a doctor immediately if symptoms worsen or persist or new concerns arise.  Patient Instructions  BEFORE YOU  LEAVE: -labs -follow up: -1) Medicare exam with susan -2) follow up with Dr. Maudie Mercury the same day if possible  We have ordered labs or studies at this visit. It can take up to 1-2 weeks for results and processing. IF results require follow up or explanation, we will call you with instructions. Clinically stable results will be released to your [facility]. If you have not heard from [place] or cannot find your results in [facility] in 2 weeks please contact our office at [phone].  If you are not yet signed up for [facility], please consider signing up.   We recommend the following healthy lifestyle for LIFE: 1) Small portions.   Tip: eat off of a salad plate instead of a dinner plate.  Tip: if you need more or a snack choose fruits, veggies and/or a handful of nuts or seeds.  2) Eat a healthy clean diet.  * Tip: Avoid (less then 1 serving per week): processed foods, sweets, sweetened drinks, white starches (rice, flour, bread, potatoes, pasta, etc), red meat, fast foods,  butter  *Tip: CHOOSE instead   * 5-9 servings per day of fresh or frozen fruits and vegetables (but not corn, potatoes, bananas, canned or dried fruit)   *nuts and seeds, beans   *olives and olive oil   *small portions of lean meats such as fish and white chicken    *small portions of whole grains  3)Get at least 150 minutes of sweaty aerobic exercise per week.  4)Reduce stress - consider counseling, meditation and relaxation to balance other aspects of your life.    FOR IMPROVED SLEEP AND TO RESET YOUR SLEEP SCHEDULE: []  exercise 30 minutes daily  []  go to bed and wake up at the same time  []  keep bedroom cool, dark and quiet  []  reserve bed for sleep - do not read, watch TV, etc in bed  []  If you toss and turn more then 15-20 minutes get out of bed and list thoughts/do quite activity then go back to bed; repeat as needed; do not worry about when you eventually fall asleep - still get up at the same time and turn on lights and take shower  [] get counseling  []  some people find that a half dose of benadryl, melatonin, tylenol pm or unisom on a few nights per week is helpful initially for a few weeks  [] seek help and treat any depression or anxiety  [] prescription strength sleep medications should only be used in severe cases of insomnia if other measures fail and should be used sparingly         Destynie Toomey, Jarrett Soho R.

## 2016-03-10 NOTE — Patient Instructions (Addendum)
BEFORE YOU LEAVE: -labs -follow up: -1) Medicare exam with susan -2) follow up with Dr. Maudie Mercury the same day if possible  We have ordered labs or studies at this visit. It can take up to 1-2 weeks for results and processing. IF results require follow up or explanation, we will call you with instructions. Clinically stable results will be released to your [facility]. If you have not heard from [place] or cannot find your results in [facility] in 2 weeks please contact our office at [phone].  If you are not yet signed up for [facility], please consider signing up.   We recommend the following healthy lifestyle for LIFE: 1) Small portions.   Tip: eat off of a salad plate instead of a dinner plate.  Tip: if you need more or a snack choose fruits, veggies and/or a handful of nuts or seeds.  2) Eat a healthy clean diet.  * Tip: Avoid (less then 1 serving per week): processed foods, sweets, sweetened drinks, white starches (rice, flour, bread, potatoes, pasta, etc), red meat, fast foods, butter  *Tip: CHOOSE instead   * 5-9 servings per day of fresh or frozen fruits and vegetables (but not corn, potatoes, bananas, canned or dried fruit)   *nuts and seeds, beans   *olives and olive oil   *small portions of lean meats such as fish and white chicken    *small portions of whole grains  3)Get at least 150 minutes of sweaty aerobic exercise per week.  4)Reduce stress - consider counseling, meditation and relaxation to balance other aspects of your life.    FOR IMPROVED SLEEP AND TO RESET YOUR SLEEP SCHEDULE: []  exercise 30 minutes daily  []  go to bed and wake up at the same time  []  keep bedroom cool, dark and quiet  []  reserve bed for sleep - do not read, watch TV, etc in bed  []  If you toss and turn more then 15-20 minutes get out of bed and list thoughts/do quite activity then go back to bed; repeat as needed; do not worry about when you eventually fall asleep - still get up at the same time and turn  on lights and take shower  [] get counseling  []  some people find that a half dose of benadryl, melatonin, tylenol pm or unisom on a few nights per week is helpful initially for a few weeks  [] seek help and treat any depression or anxiety  [] prescription strength sleep medications should only be used in severe cases of insomnia if other measures fail and should be used sparingly

## 2016-03-10 NOTE — Progress Notes (Signed)
Pre visit review using our clinic review tool, if applicable. No additional management support is needed unless otherwise documented below in the visit note. 

## 2016-03-13 MED ORDER — METFORMIN HCL 1000 MG PO TABS: 1000.0000 mg | ORAL_TABLET | Freq: Two times a day (BID) | ORAL | 3 refills | Status: DC

## 2016-03-13 MED ORDER — LEVOTHYROXINE SODIUM 137 MCG PO TABS: 137.0000 ug | ORAL_TABLET | Freq: Every day | ORAL | 1 refills | Status: DC

## 2016-03-13 NOTE — Addendum Note (Signed)
Addended by: Agnes Lawrence on: 03/13/2016 02:58 PM   Modules accepted: Orders

## 2016-03-13 NOTE — Addendum Note (Signed)
Addended by: Agnes Lawrence on: 03/13/2016 02:25 PM   Modules accepted: Orders

## 2016-03-17 ENCOUNTER — Encounter: Payer: Self-pay | Admitting: Cardiology

## 2016-03-20 ENCOUNTER — Encounter: Payer: Self-pay | Admitting: Family Medicine

## 2016-04-06 ENCOUNTER — Telehealth: Payer: Self-pay | Admitting: *Deleted

## 2016-04-06 NOTE — Telephone Encounter (Signed)
Dr Maudie Mercury received a fax from [facility] [phone]) to be completed for CPAP supplies.  Per Dr Maudie Mercury this should be completed by Dr Melvyn Novas who has diagnosed the pt with sleep apnea and I spoke with Gerald Stabs and informed her of this and she was given the phone number for Dr Melvyn Novas.

## 2016-04-07 ENCOUNTER — Ambulatory Visit: Payer: Medicare Other | Admitting: Dietician

## 2016-04-13 LAB — CUP PACEART REMOTE DEVICE CHECK
Battery Remaining Percentage: 85 %
Date Time Interrogation Session: 20171128120200
MDC IDC LEAD IMPLANT DT: 20160707
MDC IDC LEAD LOCATION: 753858
MDC IDC PG IMPLANT DT: 20160707
Pulse Gen Serial Number: 115852

## 2016-04-21 ENCOUNTER — Ambulatory Visit: Payer: Medicare Other | Admitting: Dietician

## 2016-04-25 ENCOUNTER — Ambulatory Visit: Payer: Medicare Other | Admitting: Internal Medicine

## 2016-05-08 ENCOUNTER — Ambulatory Visit (INDEPENDENT_AMBULATORY_CARE_PROVIDER_SITE_OTHER): Payer: Medicare Other | Admitting: Internal Medicine

## 2016-05-08 ENCOUNTER — Encounter: Payer: Self-pay | Admitting: Internal Medicine

## 2016-05-08 VITALS — BP 180/102 | HR 65

## 2016-05-08 DIAGNOSIS — F17210 Nicotine dependence, cigarettes, uncomplicated: Secondary | ICD-10-CM

## 2016-05-08 DIAGNOSIS — G4731 Primary central sleep apnea: Secondary | ICD-10-CM | POA: Diagnosis not present

## 2016-05-08 DIAGNOSIS — I10 Essential (primary) hypertension: Secondary | ICD-10-CM | POA: Diagnosis not present

## 2016-05-08 NOTE — Progress Notes (Signed)
Subjective:     Patient ID: Paula Stein, female   DOB: 1945-11-15, .   MRN: [id number]    Brief patient profile:  12  yobf active smoker previously followed by [facility] for complex sleep apnea on BIPAP/ last seen 07/07/2013 self referred for re-eval and wants to change to nasal pillows     History of Present Illness  04/26/2015 1st [facility] office visit/ [facility]   Chief Complaint  Patient presents with  . Follow-up    Former Dr Gwenette Greet patient. Pt states here to f/u on sleep. She is doing well with CPAP and averages about 9 hours of sleep every night. She would like to try nasal mask rather than FFM.   feels rested/ not sleepy during the day, no am ha p using 9.5 h per night  Wt up since original study x 8 lbs  rec Please see patient coordinator before you leave today  to schedule nasal pillows and download     05/08/2016  f/u ov/[facility] re:  osa Chief Complaint  Patient presents with  . Follow-up    Pt states having trouble staying asleep. She averages 5 hours every night. She feels the need to nap during the day but she doesn't.    slept well night before ov for first time in a long time s machiine and felt great   Not limited by breathing from desired activities    No obvious day to day or daytime variability or assoc excess/ purulent sputum or mucus plugs or hemoptysis or cp or chest tightness, subjective wheeze or overt sinus or hb symptoms. No unusual exp hx or h/o childhood pna/ asthma or knowledge of premature birth.  Sleeping ok without nocturnal  or early am exacerbation  of respiratory  c/o's or need for noct saba. Also denies any obvious fluctuation of symptoms with weather or environmental changes or other aggravating or alleviating factors except as outlined above   Current Medications, Allergies, Complete Past Medical History, Past Surgical History, Family History, and Social History were reviewed in [employer] record.  ROS  The following are not  active complaints unless bolded sore throat, dysphagia, dental problems, itching, sneezing,  nasal congestion or excess/ purulent secretions, ear ache,   fever, chills, sweats, unintended wt loss, classically pleuritic or exertional cp,  orthopnea pnd or leg swelling, presyncope, palpitations, abdominal pain, anorexia, nausea, vomiting, diarrhea  or change in bowel or bladder habits, change in stools or urine, dysuria,hematuria,  rash, arthralgias, visual complaints, headache, numbness, weakness or ataxia or problems with walking or coordination,  change in mood/affect or memory.                      Objective:   Physical Exam   amb bf nad   05/08/2016         Scales inop   04/26/15 232 lb 12.8 oz (105.597 kg)  04/06/15 230 lb 11.2 oz (104.645 kg)  02/08/15 234 lb 9.6 oz (106.414 kg)    Vital signs reviewed  - - Note on arrival 02 sats  98% on RA and BP 180/102 before am bp meds      HEENT: nl   turbinates, and oropharynx. Nl external ear canals without cough reflex- upper denture  Modified Mallampati Score = 1   NECK :  without JVD/Nodes/TM/ nl carotid upstrokes bilaterally   LUNGS: no acc muscle use,  Nl contour chest which is clear to A and P bilaterally without  cough on insp or exp maneuvers   CV:  RRR  no s3 or murmur or increase in P2, no edema   ABD:  soft and nontender with nl inspiratory excursion in the supine position. No bruits or organomegaly, bowel sounds nl  MS:  Nl gait/ ext warm without deformities, calf tenderness, cyanosis or clubbing No obvious joint restrictions   SKIN: warm and dry without lesions    NEURO:  alert, approp, nl sensorium with  no motor deficits        Assessment:

## 2016-05-08 NOTE — Patient Instructions (Signed)
Try off bipap machine for now, keep working on the wt loss and avoiding bright light exposure at night so you can sleep better   We will do a sleepiness score today and repeat it when you come back  but if your are clearly worse off the bipap start it back  We will refer you to one of our new sleep doctors to be seen next available appointment

## 2016-05-09 NOTE — Assessment & Plan Note (Signed)
Elevated today prior to taking usual meds but if becomes difficult to control despite back on meds this would be another reason to reinforce need for bipap compliance

## 2016-05-09 NOTE — Assessment & Plan Note (Addendum)
NPSG 09/2012:  AHI 69/hr with central and obstructive events (+ C-S respirations)   Failed cpap, excellent response to bilevel 17/13. - 04/26/2015  requested nasal pillows  - 05/08/2016 epworth score = 5   With wt loss pt feels sleeps better without bipap and declines to use it further so referred to sleep medicine for a second opinion as to whether it's even needed at this point and if so at what levels  If noting more daytime drowsiness suggested she go back on same setting pending sleep consult  Also reviewed optimal sleep hygiene  Issues  I had an extended discussion with the patient reviewing all relevant studies completed to date and  lasting 15 to 20 minutes of a 25 minute visit    Each maintenance medication was reviewed in detail including most importantly the difference between maintenance and prns and under what circumstances the prns are to be triggered using an action plan format that is not reflected in the computer generated alphabetically organized AVS.    Please see AVS for specific instructions unique to this visit that I personally wrote and verbalized to the the pt in detail and then reviewed with pt  by my nurse highlighting any  changes in therapy recommended at today's visit to their plan of care.

## 2016-05-09 NOTE — Assessment & Plan Note (Signed)
>   3 min Discussed the risks and costs (both direct and indirect)  of smoking relative to the benefits of quitting but patient unwilling to commit at this point to a specific quit date.    Although I don't endorse regular use of e cigs/ many pts find them helpful; however, I emphasized they should be considered a "one-way bridge" off all tobacco products.  

## 2016-05-13 ENCOUNTER — Encounter (HOSPITAL_COMMUNITY): Payer: Self-pay | Admitting: Vascular Surgery

## 2016-05-13 ENCOUNTER — Emergency Department (HOSPITAL_COMMUNITY)
Admission: EM | Admit: 2016-05-13 | Discharge: 2016-05-14 | Disposition: A | Discharge status: Home / Self Care | Payer: Medicare Other | Attending: Emergency Medicine | Admitting: Emergency Medicine

## 2016-05-13 ENCOUNTER — Emergency Department (HOSPITAL_COMMUNITY): Payer: Medicare Other

## 2016-05-13 DIAGNOSIS — Z7984 Long term (current) use of oral hypoglycemic drugs: Secondary | ICD-10-CM | POA: Diagnosis not present

## 2016-05-13 DIAGNOSIS — M79671 Pain in right foot: Secondary | ICD-10-CM | POA: Diagnosis not present

## 2016-05-13 DIAGNOSIS — I509 Heart failure, unspecified: Secondary | ICD-10-CM | POA: Diagnosis not present

## 2016-05-13 DIAGNOSIS — E119 Type 2 diabetes mellitus without complications: Secondary | ICD-10-CM | POA: Diagnosis not present

## 2016-05-13 DIAGNOSIS — I110 Hypertensive heart disease with heart failure: Secondary | ICD-10-CM | POA: Insufficient documentation

## 2016-05-13 DIAGNOSIS — F17210 Nicotine dependence, cigarettes, uncomplicated: Secondary | ICD-10-CM | POA: Diagnosis not present

## 2016-05-13 DIAGNOSIS — Z7982 Long term (current) use of aspirin: Secondary | ICD-10-CM | POA: Diagnosis not present

## 2016-05-13 DIAGNOSIS — Z79899 Other long term (current) drug therapy: Secondary | ICD-10-CM | POA: Diagnosis not present

## 2016-05-13 NOTE — ED Triage Notes (Signed)
Has been told in the past that she has RA in her foot.

## 2016-05-13 NOTE — ED Notes (Addendum)
Pt refusing ice pack. Pt states anything on top of her foot hurts.

## 2016-05-13 NOTE — ED Triage Notes (Signed)
Pt reports to the ED for eval of right foot pain. States that today when she stood up out of her chair she developed severe pain that radiates up her leg when she bears weight on it. Denies any known injury to her foot. Pt reports some mild swelling to the right foot. CMS intact. Had similar episode occur to her left foot and she was given injections in her foot and that helped her pain.

## 2016-05-13 NOTE — ED Notes (Signed)
Pt states about 1300 this date she just began experiencing pain to her right foot with no known injury. Pt states she was sitting down and went to stand up when the pain started. Pt states about 2 months ago she had the same episode I her left foot and was diagnosed with arthritis.

## 2016-05-14 MED ORDER — HYDROCODONE-ACETAMINOPHEN 5-325 MG PO TABS
2.0000 | ORAL_TABLET | Freq: Once | ORAL | Status: AC
  Administered 2016-05-14: 2 via ORAL
  Filled 2016-05-14: qty 2

## 2016-05-14 MED ORDER — MELOXICAM 15 MG PO TABS: 15.0000 mg | ORAL_TABLET | Freq: Every day | ORAL | 0 refills | Status: DC

## 2016-05-14 MED ORDER — MELOXICAM 7.5 MG PO TABS
15.0000 mg | ORAL_TABLET | Freq: Once | ORAL | Status: AC
  Administered 2016-05-14: 15 mg via ORAL
  Filled 2016-05-14: qty 2

## 2016-05-14 MED ORDER — HYDROCODONE-ACETAMINOPHEN 5-325 MG PO TABS: 1.0000 | ORAL_TABLET | Freq: Four times a day (QID) | ORAL | 0 refills | Status: DC | PRN

## 2016-05-14 NOTE — Progress Notes (Signed)
Orthopedic Tech Progress Note Patient Details:  Paula Stein November 05, 1945 [id number]  Ortho Devices Type of Ortho Device: Crutches Ortho Device/Splint Interventions: Ordered, Application   Karolee Stamps 05/14/2016, 12:45 AM

## 2016-05-14 NOTE — ED Provider Notes (Signed)
TIME SEEN: 0011  CHIEF COMPLAINT: Foot pain  HPI: HPI Comments:  Paula Stein is a 71 y.o. female with history of HTN, CHF with AICD, HLD, and PAD who presents to the ED for evaluation of R foot pain. The patient states that this morning she woke with "deep", constant R foot pain that worsens when attempting to bear weight on that side. Pain extends over the superior and medial aspect. She has experienced similar complaints before in the L foot. She was seen by a podiatrist (Dr. Casilda Carls) at this time who diagnosed with arthritis, gave her "a shot", and she has not had problems since. She denies any fevers, recent trauma, peripheral edema, or history of bleeding/clotting disorders. She is followed by Dr. Casilda Carls of [facility]. Has been able to ambulate.   ROS: See HPI Constitutional: no fever  Eyes: no drainage  ENT: no runny nose   Cardiovascular:  no chest pain  Resp: no SOB  GI: no vomiting GU: no dysuria Integumentary: no rash  Allergy: no hives  Musculoskeletal: no leg swelling + R foot pain.  Neurological: no slurred speech ROS otherwise negative  PAST MEDICAL HISTORY/PAST SURGICAL HISTORY:  Past Medical History:  Diagnosis Date  . AICD (automatic cardioverter/defibrillator) present 10/08/2014   SUBQ    /    DR [name]  . CHF (congestive heart failure) ([place])   . Diabetes mellitus without complication ([place])   . History of cardiac cath 05/2007   normal-with patent coronaries  . History of colonoscopy   . History of hiatal hernia   . Hypertension   . Iatrogenic thyroiditis   . Non-ischemic cardiomyopathy ([place])    EF 28%- reassessment of LV function 2011 with LVEf 45-50%  . PAD (peripheral artery disease) (HCC)    lower extremities with ABIs of 0.5 bilaterally  . Personal history of goiter   . S/P radioactive iodine thyroid ablation   . S/P thyroidectomy   . Shortness of breath dyspnea     MEDICATIONS:  Prior to Admission medications   Medication Sig Start Date  End Date Taking? Authorizing Provider  aspirin 81 MG tablet Take 81 mg by mouth daily.      Historical Provider, MD  atorvastatin (LIPITOR) 40 MG tablet Take 1 tablet (40 mg total) by mouth daily. 06/28/15   [name], MD  carvedilol (COREG) 25 MG tablet Take 1 tablet (25 mg total) by mouth 2 (two) times daily with a meal. 06/28/15   [name], MD  furosemide (LASIX) 40 MG tablet Take 1 tablet (40 mg total) by mouth 2 (two) times daily. 06/28/15   [name], MD  hydrALAZINE (APRESOLINE) 50 MG tablet take 1 tablet by mouth three times a day 12/22/15   Doe-Hyun R Shawna Orleans, DO  isosorbide mononitrate (IMDUR) 30 MG 24 hr tablet Take 2 tablets (60 mg total) by mouth daily. 06/28/15   [name], MD  levothyroxine (SYNTHROID, LEVOTHROID) 137 MCG tablet Take 1 tablet (137 mcg total) by mouth daily before breakfast. 03/13/16   Lucretia Kern, DO  metFORMIN (GLUCOPHAGE) 1000 MG tablet Take 1 tablet (1,000 mg total) by mouth 2 (two) times daily with a meal. 03/13/16   Lucretia Kern, DO  sacubitril-valsartan (ENTRESTO) 49-51 MG Take 1 tablet by mouth 2 (two) times daily. 01/07/16   [name], MD  spironolactone (ALDACTONE) 50 MG tablet Take 1 tablet (50 mg total) by mouth daily. 07/15/14   [name], MD    ALLERGIES:  Allergies  Allergen Reactions  . Ibuprofen Nausea Only    SOCIAL HISTORY:  Social History  Substance Use Topics  . Smoking status: Current Every Day Smoker    Packs/day: 0.25    Years: 40.00    Types: Cigarettes  . Smokeless tobacco: Never Used  . Alcohol use No    FAMILY HISTORY: Family History  Problem Relation Age of Onset  . Diabetes type II Mother   . Hypertension Mother   . Diabetes Mother   . Heart disease Mother   . Other Father     deceased from accident age 3  . Hyperlipidemia Father     EXAM: BP 188/95 (BP Location: Right Arm)   Pulse 93   Temp 97.7 F (36.5 C) (Oral)   Resp 18   Ht 5\' 10"  (1.778 m)   Wt 222 lb (100.7 kg)   SpO2 96%    BMI 31.85 kg/m  CONSTITUTIONAL: Alert and oriented and responds appropriately to questions. Well-appearing; well-nourished HEAD: Normocephalic EYES: Conjunctivae clear, PERRL, EOMI ENT: normal nose; no rhinorrhea; moist mucous membranes NECK: Supple, no meningismus, no nuchal rigidity, no LAD  CARD: RRR; S1 and S2 appreciated; no murmurs, no clicks, no rubs, no gallops. 2+ DP pulses on Right.  RESP: Normal chest excursion without splinting or tachypnea; breath sounds clear and equal bilaterally; no wheezes, no rhonchi, no rales, no hypoxia or respiratory distress, speaking full sentences ABD/GI: Normal bowel sounds; non-distended; soft, non-tender, no rebound, no guarding, no peritoneal signs, no hepatosplenomegaly BACK:  The back appears normal and is non-tender to palpation, there is no CVA tenderness EXT: TTP over the medial and dorsal aspect of the right foot over the extensor hallucis longus. No erythema, warmth, swelling, ecchymosis. Extremities warm and well-perfused. Noted joint effusion. No bony tenderness or bony deformity. Pain with flexion and extension of the right great toe. Normal ROM in all joints; otherwise extremities are non-tender to palpation; no edema; normal capillary refill; no cyanosis, no calf tenderness or swelling    SKIN: Normal color for age and race; warm; no rash NEURO: Moves all extremities equally, sensation to light touch intact diffusely PSYCH: The patient's mood and manner are appropriate. Grooming and personal hygiene are appropriate.  MEDICAL DECISION MAKING: Patient here with right foot pain. Suspect tendinitis. No sign of septic arthritis, arterial obstruction, DVT, gout. Had similar symptoms in her left foot and received what sounds like steroid injections by a podiatrist. Have recommend close follow-up with this podiatrist. She has taken Mobitz in the past with good relief. Recent normal creatinine in December by her PCP. We'll discharge with low back,  Vicodin. We'll discharge with crutches to help her with a relating. Have recommended rest, elevation and ice. Discussed return cautions with patient and her son. He is here to drive her home. She is comfortable with this plan.    At this time, I do not feel there is any life-threatening condition present. I have reviewed and discussed all results (EKG, imaging, lab, urine as appropriate) and exam findings with patient/family. I have reviewed nursing notes and appropriate previous records.  I feel the patient is safe to be discharged home without further emergent workup and can continue workup as an outpatient as needed. Discussed usual and customary return precautions. Patient/family verbalize understanding and are comfortable with this plan.  Outpatient follow-up has been provided. All questions have been answered.   I personally performed the services described in this documentation, which was scribed in my presence. The recorded information  has been reviewed and is accurate.    [place], DO 05/14/16 [phone]

## 2016-05-18 ENCOUNTER — Ambulatory Visit: Payer: Medicare Other | Admitting: Dietician

## 2016-05-30 ENCOUNTER — Ambulatory Visit (INDEPENDENT_AMBULATORY_CARE_PROVIDER_SITE_OTHER): Payer: Medicare Other | Admitting: *Deleted

## 2016-05-30 DIAGNOSIS — I428 Other cardiomyopathies: Secondary | ICD-10-CM

## 2016-05-30 NOTE — Progress Notes (Signed)
Remote ICD transmission.   

## 2016-05-31 ENCOUNTER — Encounter: Payer: Self-pay | Admitting: Cardiology

## 2016-06-02 LAB — CUP PACEART REMOTE DEVICE CHECK
Battery Remaining Percentage: 81 %
Implantable Lead Implant Date: 20160707
Implantable Pulse Generator Implant Date: 20160707
MDC IDC LEAD LOCATION: 753858
MDC IDC SESS DTM: 20180227102600
Pulse Gen Serial Number: 115852

## 2016-06-07 NOTE — Progress Notes (Signed)
HPI:  Paula Stein is a pleasant 71 y.o. here for follow up. Chronic medical problems summarized below were reviewed for changes and stability and were updated as needed below. These issues and their treatment remain stable for the most part. Increased metformin and referred to diabetes educator after last visit. Also advised change in thyroid treatment. She was going to consult her cardiologist on her cholesterol. Having some difficulty with sleep. Feels like the Mobitz contributed to the to this. Has not changed her cholesterol medicine. She does not like the metformin as it causes diarrhea, so she is only taking it once daily. Continues to smoke, but has cut back. She does want to quit. She is trying to get healthier. Denies CP, SOB, DOE, treatment intolerance or new symptoms. Seeing susan for preventive visit today. She actually missed this appointment as she thought it was later. She will reschedule this.  Hypertension, CsCHF w/ AICD, cardiomyopathy, HLD, PAD: -hx difficult to control BP -seeing cardiologist for her cholesterol, quite high -other meds:spironolactone, entrestol, isosorbide, hydralazine, lasix, coreg, asa, statin -denies cp, sob, doe, swelling  Tobacco use: -cutting back -she does want to stop smoking and thinks she can  DM: -meds: metformin  -only taking once daily   -has neuropathy in feet, sees podiatrist -does eye exam yearly - [facility] a doctor [facility]  Hypothyroidism -meds: synthroid  Poor sleep: -watches news to fall asleep, wakes a few times during the night -eats and drinks a lot of before bed -no snoring or apnea hat she is aware -no depression or anxiety   ROS: See pertinent positives and negatives per HPI.  Past Medical History:  Diagnosis Date  . AICD (automatic cardioverter/defibrillator) present 10/08/2014   SUBQ    /    DR [name]  . CHF (congestive heart failure) ([place])   . Diabetes mellitus without complication ([place])   . History of  cardiac cath 05/2007   normal-with patent coronaries  . History of colonoscopy   . History of hiatal hernia   . Hypertension   . Iatrogenic thyroiditis   . Non-ischemic cardiomyopathy ([place])    EF 28%- reassessment of LV function 2011 with LVEf 45-50%  . PAD (peripheral artery disease) (HCC)    lower extremities with ABIs of 0.5 bilaterally  . Personal history of goiter   . S/P radioactive iodine thyroid ablation   . S/P thyroidectomy   . Shortness of breath dyspnea     Past Surgical History:  Procedure Laterality Date  . EP IMPLANTABLE DEVICE N/A 10/08/2014   Procedure: SubQ ICD Implant;  Surgeon: [name], MD;  Location: [facility];  Service: Cardiovascular;  Laterality: N/A;  . THYROIDECTOMY    . TONSILLECTOMY    . TOTAL ABDOMINAL HYSTERECTOMY      Family History  Problem Relation Age of Onset  . Diabetes type II Mother   . Hypertension Mother   . Diabetes Mother   . Heart disease Mother   . Other Father     deceased from accident age 70  . Hyperlipidemia Father     Social History   Social History  . Marital status: Divorced    Spouse name: N/A  . Number of children: 1  . Years of education: N/A   Occupational History  . Retired Retired    Education officer, museum   Social History Main Topics  . Smoking status: Current Every Day Smoker    Packs/day: 0.25    Years: 40.00  Types: Cigarettes  . Smokeless tobacco: Never Used  . Alcohol use No  . Drug use: No  . Sexual activity: Not Currently   Other Topics Concern  . None   Social History Narrative   Retired Education officer, museum   Divorced - one grown son   current smoker    Alcohol use-no      Drug use-no    Regular exercise- no     son Paula Stein     Current Outpatient Prescriptions:  .  aspirin 81 MG tablet, Take 81 mg by mouth daily.  , Disp: , Rfl:  .  atorvastatin (LIPITOR) 40 MG tablet, Take 1.5 tablets (60 mg total) by mouth daily., Disp: 135 tablet, Rfl: 3 .  carvedilol (COREG) 25 MG  tablet, Take 1 tablet (25 mg total) by mouth 2 (two) times daily with a meal., Disp: 180 tablet, Rfl: 3 .  furosemide (LASIX) 40 MG tablet, Take 1 tablet (40 mg total) by mouth 2 (two) times daily., Disp: 180 tablet, Rfl: 3 .  hydrALAZINE (APRESOLINE) 50 MG tablet, take 1 tablet by mouth three times a day, Disp: 270 tablet, Rfl: 1 .  HYDROcodone-acetaminophen (NORCO/VICODIN) 5-325 MG tablet, Take 1-2 tablets by mouth every 6 (six) hours as needed., Disp: 15 tablet, Rfl: 0 .  isosorbide mononitrate (IMDUR) 30 MG 24 hr tablet, Take 2 tablets (60 mg total) by mouth daily., Disp: 180 tablet, Rfl: 3 .  levothyroxine (SYNTHROID, LEVOTHROID) 137 MCG tablet, Take 1 tablet (137 mcg total) by mouth daily before breakfast., Disp: 90 tablet, Rfl: 1 .  sacubitril-valsartan (ENTRESTO) 49-51 MG, Take 1 tablet by mouth 2 (two) times daily., Disp: 60 tablet, Rfl: 2 .  spironolactone (ALDACTONE) 50 MG tablet, Take 1 tablet (50 mg total) by mouth daily., Disp: 30 tablet, Rfl: 11 .  glipiZIDE (GLUCOTROL XL) 2.5 MG 24 hr tablet, Take 1 tablet (2.5 mg total) by mouth daily with breakfast., Disp: 90 tablet, Rfl: 3 .  metformin (FORTAMET) 1000 MG (OSM) 24 hr tablet, Take 1 tablet (1,000 mg total) by mouth daily with breakfast., Disp: 90 tablet, Rfl: 0  EXAM:  Vitals:   06/08/16 1013  BP: 138/80  Pulse: 72  Temp: 98.2 F (36.8 C)    Body mass index is 32.8 kg/m.  GENERAL: vitals reviewed and listed above, alert, oriented, appears well hydrated and in no acute distress  HEENT: atraumatic, conjunttiva clear, no obvious abnormalities on inspection of external nose and ears  NECK: no obvious masses on inspection  LUNGS: clear to auscultation bilaterally, no wheezes, rales or rhonchi, good air movement  CV: HRRR, no peripheral edema  MS: moves all extremities without noticeable abnormality  PSYCH: pleasant and cooperative, no obvious depression or anxiety  ASSESSMENT AND PLAN:  Discussed the following  assessment and plan:  Type 2 diabetes mellitus with diabetic polyneuropathy, without long-term current use of insulin (HCC)  Essential hypertension  Hyperlipidemia, unspecified hyperlipidemia type  Hypothyroidism, unspecified type  Nonischemic cardiomyopathy ([place])  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  -We will go ahead and increase her Lipitor to 60 mg daily, may need to add sodium  -diabetes educator -Patient advised to return or notify a doctor immediately if symptoms worsen or persist or new concerns arise.  Patient Instructions  BEFORE YOU LEAVE: -time for wellness visit with Manuela Schwartz today? Plan to address her colon cancer screening  and eye exam at preventive visit. -follow up: 3 month  Stop smoking. Use gum instead if you have a strong craving. You CAN!  Increase the lipitor (atorvastatin) to 60 mg daily.  Change to long acting metformin since you are only taking it once daily - I sent Rx. Add Glipizide 2.5 mg daily 30 minutes before largest meal.  Get the sleep study as planned. Stop the mobic since it is interfering with your sleep. Try topical capsaicin sports creams and or ice or tylenol instead as needed for pain per instructions (availbale over the counter.)  Consider cognitive behavioral therapy for sleep.  See the diabetes educator.  We have ordered labs or studies at this  visit. It can take up to 1-2 weeks for results and processing. IF results require follow up or explanation, we will call you with instructions. Clinically stable results will be released to your [facility]. If you have not heard from [place] or cannot find your results in [facility] in 2 weeks please contact our office at [phone].  If you are not yet signed up for [facility], please consider signing up.   We recommend the following healthy lifestyle for LIFE: 1) Small portions.   Tip: eat off of a salad plate instead of a dinner plate.  Tip: It is ok to feel hungry after a meal of proper portion sizes.  Tip: if you need more or a snack choose fruits, veggies and/or a handful of nuts or seeds.  2) Eat a healthy clean diet.  * Tip: Avoid (less then 1 serving per week): processed foods, sweets, sweetened drinks, white starches (rice, flour, bread, potatoes, pasta, etc), red meat, fast foods, butter  *Tip: CHOOSE instead   * 5-9 servings per day of fresh or frozen fruits and vegetables (but not corn, potatoes, bananas, canned or dried fruit)   *nuts and seeds, beans   *olives and olive oil   *small portions of lean meats such as fish and white chicken    *small portions of whole grains  3)Get at least 150 minutes of sweaty aerobic exercise per week.  4)Reduce stress - consider counseling, meditation and relaxation to balance other aspects of your life.    WE NOW OFFER   [facility] FAST TRACK!!!  SAME DAY Appointments for ACUTE CARE  Such as: Sprains, Injuries, cuts, abrasions, rashes, muscle pain, joint pain, back pain Colds, flu, sore throats, headache, allergies, cough, fever  Ear pain, sinus and eye infections Abdominal pain, nausea, vomiting, diarrhea, upset stomach Animal/insect bites  3 Easy Ways to Schedule: Walk-In Scheduling Call in scheduling Mychart Sign-up: [email or web address]               Colin Benton R., DO

## 2016-06-08 ENCOUNTER — Encounter: Payer: Self-pay | Admitting: Family Medicine

## 2016-06-08 ENCOUNTER — Ambulatory Visit: Payer: Medicare Other

## 2016-06-08 ENCOUNTER — Ambulatory Visit (INDEPENDENT_AMBULATORY_CARE_PROVIDER_SITE_OTHER): Payer: Medicare Other | Admitting: Family Medicine

## 2016-06-08 VITALS — BP 138/80 | HR 72 | Temp 98.2°F | Ht 70.0 in | Wt 228.6 lb

## 2016-06-08 DIAGNOSIS — E039 Hypothyroidism, unspecified: Secondary | ICD-10-CM

## 2016-06-08 DIAGNOSIS — E1142 Type 2 diabetes mellitus with diabetic polyneuropathy: Secondary | ICD-10-CM

## 2016-06-08 DIAGNOSIS — Z6832 Body mass index (BMI) 32.0-32.9, adult: Secondary | ICD-10-CM

## 2016-06-08 DIAGNOSIS — I428 Other cardiomyopathies: Secondary | ICD-10-CM | POA: Diagnosis not present

## 2016-06-08 DIAGNOSIS — I10 Essential (primary) hypertension: Secondary | ICD-10-CM

## 2016-06-08 DIAGNOSIS — E784 Other hyperlipidemia: Secondary | ICD-10-CM

## 2016-06-08 DIAGNOSIS — G4700 Insomnia, unspecified: Secondary | ICD-10-CM | POA: Diagnosis not present

## 2016-06-08 DIAGNOSIS — E785 Hyperlipidemia, unspecified: Secondary | ICD-10-CM | POA: Diagnosis not present

## 2016-06-08 DIAGNOSIS — F17210 Nicotine dependence, cigarettes, uncomplicated: Secondary | ICD-10-CM

## 2016-06-08 DIAGNOSIS — E7849 Other hyperlipidemia: Secondary | ICD-10-CM

## 2016-06-08 DIAGNOSIS — G629 Polyneuropathy, unspecified: Secondary | ICD-10-CM

## 2016-06-08 DIAGNOSIS — I70219 Atherosclerosis of native arteries of extremities with intermittent claudication, unspecified extremity: Secondary | ICD-10-CM | POA: Diagnosis not present

## 2016-06-08 MED ORDER — ATORVASTATIN CALCIUM 40 MG PO TABS: 60.0000 mg | ORAL_TABLET | Freq: Every day | ORAL | 3 refills | Status: DC

## 2016-06-08 MED ORDER — GLIPIZIDE ER 2.5 MG PO TB24: 2.5000 mg | ORAL_TABLET | Freq: Every day | ORAL | 3 refills | Status: DC

## 2016-06-08 MED ORDER — METFORMIN HCL ER (OSM) 1000 MG PO TB24: 1000.0000 mg | ORAL_TABLET | Freq: Every day | ORAL | 0 refills | Status: DC

## 2016-06-08 NOTE — Patient Instructions (Addendum)
BEFORE YOU LEAVE: -time for wellness visit with Manuela Schwartz today? Plan to address her colon cancer screening  and eye exam at preventive visit. -follow up: 3 month  Stop smoking. Use gum instead if you have a strong craving. You CAN!  Increase the lipitor (atorvastatin) to 60 mg daily.  Change to long acting metformin since you are only taking it once daily - I sent Rx. Add Glipizide 2.5 mg daily 30 minutes before largest meal.  Get the sleep study as planned. Stop the mobic since it is interfering with your sleep. Try topical capsaicin sports creams and or ice or tylenol instead as needed for pain per instructions (availbale over the counter.)  Consider cognitive behavioral therapy for sleep.  See the diabetes educator.  We have ordered labs or studies at this visit. It can take up to 1-2 weeks for results and processing. IF results require follow up or explanation, we will call you with instructions. Clinically stable results will be released to your [facility]. If you have not heard from [place] or cannot find your results in [facility] in 2 weeks please contact our office at [phone].  If you are not yet signed up for [facility], please consider signing up.   We recommend the following healthy lifestyle for LIFE: 1) Small portions.   Tip: eat off of a salad plate instead of a dinner plate.  Tip: It is ok to feel hungry after a meal of proper portion sizes.  Tip: if you need more or a snack choose fruits, veggies and/or a handful of nuts or seeds.  2) Eat a healthy clean diet.  * Tip: Avoid (less then 1 serving per week): processed foods, sweets, sweetened drinks, white starches (rice, flour, bread, potatoes, pasta, etc), red meat, fast foods, butter  *Tip: CHOOSE instead   * 5-9 servings per day of fresh or frozen fruits and vegetables (but not corn, potatoes, bananas, canned or dried fruit)   *nuts and seeds, beans   *olives and olive oil   *small portions of lean meats such as fish and white  chicken    *small portions of whole grains  3)Get at least 150 minutes of sweaty aerobic exercise per week.  4)Reduce stress - consider counseling, meditation and relaxation to balance other aspects of your life.    WE NOW OFFER   [facility] FAST TRACK!!!  SAME DAY Appointments for ACUTE CARE  Such as: Sprains, Injuries, cuts, abrasions, rashes, muscle pain, joint pain, back pain Colds, flu, sore throats, headache, allergies, cough, fever  Ear pain, sinus and eye infections Abdominal pain, nausea, vomiting, diarrhea, upset stomach Animal/insect bites  3 Easy Ways to Schedule: Walk-In Scheduling Call in scheduling Mychart Sign-up: [email or web address]

## 2016-06-08 NOTE — Progress Notes (Signed)
Pre visit review using our clinic review tool, if applicable. No additional management support is needed unless otherwise documented below in the visit note. 

## 2016-06-09 ENCOUNTER — Telehealth: Payer: Self-pay

## 2016-06-09 MED ORDER — ONETOUCH VERIO W/DEVICE KIT: 1.0000 [IU] | PACK | Freq: Once | 0 refills | Status: AC

## 2016-06-09 MED ORDER — GLUCOSE BLOOD VI STRP: ORAL_STRIP | 3 refills | Status: DC

## 2016-06-09 MED ORDER — ONETOUCH DELICA LANCETS 33G MISC: 1.0000 | Freq: Every day | 3 refills | Status: DC

## 2016-06-09 NOTE — Telephone Encounter (Signed)
Rx done. 

## 2016-06-09 NOTE — Telephone Encounter (Signed)
Received PA request from [facility] for Metformin ER 1,000 mg OSM tablet. PA submitted & is pending. Key: Paula Stein

## 2016-06-09 NOTE — Telephone Encounter (Signed)
Call placed to Ms. Goldwater and she is scheduled for AWV. Stated she needed a meter.  [employer] has voucher for one touch and she can get her strips at [place] check with dr. Maudie Mercury

## 2016-06-12 ENCOUNTER — Encounter: Payer: Self-pay | Admitting: Dietician

## 2016-06-12 ENCOUNTER — Encounter: Payer: Medicare Other | Attending: Family Medicine | Admitting: Dietician

## 2016-06-12 DIAGNOSIS — Z713 Dietary counseling and surveillance: Secondary | ICD-10-CM | POA: Diagnosis not present

## 2016-06-12 DIAGNOSIS — E1142 Type 2 diabetes mellitus with diabetic polyneuropathy: Secondary | ICD-10-CM

## 2016-06-12 NOTE — Progress Notes (Signed)
Diabetes Self-Management Education  Visit Type: First/Initial  Appt. Start Time: 0945 Appt. End Time: 1005  06/12/2016  [name], identified by name and date of birth, is a 71 y.o. female with a diagnosis of Diabetes: Type 2. Other hx includes HTN, CHF, hypothyroidism, smoking (working on quitting and is signed up for a no smoking class through [facility]).  She states that she is now taking Metformin as prescribed.  Metformin dose increased (Fortamet).  She states that she is now tolerating it but increased diarrhea on a different brand and was missing doses.  She is now taking it as prescribed.   Labs include cholesterol 258, triglycerides 163, HDL 58, LDL 167.   Patient lives alone.  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  ASSESSMENT  Height 5\' 10"  (1.778 m), weight 228 lb (103.4 kg). Body mass index is 32.71 kg/m.      Diabetes Self-Management Education - 06/12/16 0934      Visit Information   Visit Type First/Initial     Initial Visit   Diabetes Type Type 2   Are you currently following a meal plan? No   Are you taking your medications as prescribed? Yes   Date Diagnosed 10/29/2008     Health Coping   How would you rate your overall health? Fair     Psychosocial Assessment   Patient Belief/Attitude about Diabetes Motivated to manage diabetes   Self-care barriers None   Self-management support Doctor's office   Other persons present Patient   Patient Concerns Nutrition/Meal planning;Weight Control   Special Needs None   Preferred Learning Style No preference indicated   Learning Readiness Ready   How often do you need to have someone help you when you read instructions, pamphlets, or other written materials from your doctor or pharmacy? 1 - Never   What is the last grade level you completed in school? 2 years college     Pre-Education Assessment   Patient understands the diabetes disease and treatment process. Needs Review   Patient understands incorporating  nutritional management into lifestyle. Needs Review   Patient undertands incorporating physical activity into lifestyle. Needs Review   Patient understands using medications safely. Needs Review   Patient understands monitoring blood glucose, interpreting and using results Needs Review   Patient understands prevention, detection, and treatment of acute complications. Needs Review   Patient understands prevention, detection, and treatment of chronic complications. Needs Review   Patient understands how to develop strategies to address psychosocial issues. Needs Review   Patient understands how to develop strategies to promote health/change behavior. Needs Review     Complications   Last HgB A1C per patient/outside source 7.5 %  03/10/16   How often do you check your blood sugar? 1-2 times/day   Fasting Blood glucose range (mg/dL) 70-129   Postprandial Blood glucose range (mg/dL) 70-129;130-179   Number of hypoglycemic episodes per month 0   Number of hyperglycemic episodes per week 0   Have you had a dilated eye exam in the past 12 months? No   Have you had a dental exam in the past 12 months? No   Are you checking your feet? Yes   How many days per week are you checking your feet? 7     Dietary Intake   Breakfast Plain Instant Oatmeal with cinnamon, butter, sweet and low OR toast, boiled egg, hot tea  9:30   Snack (morning) Trail Mix   Lunch Out to eat [employer])  Salad bar, fried white fish   Snack (afternoon) Trail Mix   Dinner Progresso Soup and [employer] OR chicken (baked or chicken and dumplins or chicken salad or chicken in a sandwich), sweet potato or mashed potatoes, frozen vegetables with salt   Snack (evening) Triail Mix (dried fruit, coconut, nuts)   Beverage(s) Lipton peach tea (regular)- 4 per week, diet Twist, 1-2 regular soda every month, water, hot tea with lemon and 2 teaspoons honey     Exercise   Exercise Type Light (walking / raking leaves)  walking once  per week and occasional aerobics   How many days per week to you exercise? 2   How many minutes per day do you exercise? 15   Total minutes per week of exercise 30     Patient Education   Previous Diabetes Education No   Disease state  Definition of diabetes, type 1 and 2, and the diagnosis of diabetes   Nutrition management  Role of diet in the treatment of diabetes and the relationship between the three main macronutrients and blood glucose level;Food label reading, portion sizes and measuring food.;Meal options for control of blood glucose level and chronic complications.;Information on hints to eating out and maintain blood glucose control.   Physical activity and exercise  Role of exercise on diabetes management, blood pressure control and cardiac health.;Helped patient identify appropriate exercises in relation to his/her diabetes, diabetes complications and other health issue.   Medications Reviewed patients medication for diabetes, action, purpose, timing of dose and side effects.   Monitoring Purpose and frequency of SMBG.;Identified appropriate SMBG and/or A1C goals.;Yearly dilated eye exam   Acute complications Taught treatment of hypoglycemia - the 15 rule.   Chronic complications Relationship between chronic complications and blood glucose control;Retinopathy and reason for yearly dilated eye exams   Psychosocial adjustment Role of stress on diabetes;Worked with patient to identify barriers to care and solutions;Identified and addressed patients feelings and concerns about diabetes   Personal strategies to promote health Review risk of smoking and offered smoking cessation     Individualized Goals (developed by patient)   Nutrition General guidelines for healthy choices and portions discussed   Physical Activity Exercise 5-7 days per week;15 minutes per day   Monitoring  test my blood glucose as discussed   Reducing Risk stop smoking   Health Coping discuss diabetes with (comment)   MD/RD     Post-Education Assessment   Patient understands the diabetes disease and treatment process. Demonstrates understanding / competency   Patient understands incorporating nutritional management into lifestyle. Demonstrates understanding / competency   Patient undertands incorporating physical activity into lifestyle. Demonstrates understanding / competency   Patient understands using medications safely. Demonstrates understanding / competency   Patient understands monitoring blood glucose, interpreting and using results Demonstrates understanding / competency   Patient understands prevention, detection, and treatment of acute complications. Demonstrates understanding / competency   Patient understands prevention, detection, and treatment of chronic complications. Demonstrates understanding / competency   Patient understands how to develop strategies to address psychosocial issues. Demonstrates understanding / competency   Patient understands how to develop strategies to promote health/change behavior. Demonstrates understanding / competency     Outcomes   Expected Outcomes Demonstrated interest in learning. Expect positive outcomes   Future DMSE PRN   Program Status Completed      Individualized Plan for Diabetes Self-Management Training:   Learning Objective:  Patient will have a greater understanding of diabetes self-management. Patient education plan is  to attend individual and/or group sessions per assessed needs and concerns.   Plan:   Patient Instructions  Increase your activity.  Aim for 15 minutes of walking or armchair exercises, dancing or other things that you enjoy.  Increase to 30 minutes per day as able. Continue to work on smoking cessation.  Great job on the changes you have made. Rethink what you drink.  Aim for beverages with no carbohydrate. Consider protein bar rather than granola bar. Consider changing to natural peanut butter Read the labels for  carbohydrate, fat, and sodium. Change your vegetables to fresh vegetables, no added salt canned, frozen without added salt. Continue to take your medication as prescribed.   Expected Outcomes:  Demonstrated interest in learning. Expect positive outcomes  Education material provided: Living Well with Diabetes, Food label handouts, A1C conversion sheet, Meal plan card, My Plate and Snack sheet  If problems or questions, patient to contact team via:  Phone and Email  Future DSME appointment: PRN

## 2016-06-12 NOTE — Patient Instructions (Addendum)
Increase your activity.  Aim for 15 minutes of walking or armchair exercises, dancing or other things that you enjoy.  Increase to 30 minutes per day as able. Continue to work on smoking cessation.  Great job on the changes you have made. Rethink what you drink.  Aim for beverages with no carbohydrate. Consider protein bar rather than granola bar. Consider changing to natural peanut butter Read the labels for carbohydrate, fat, and sodium. Change your vegetables to fresh vegetables, no added salt canned, frozen without added salt. Continue to take your medication as prescribed.[place]

## 2016-06-13 NOTE — Telephone Encounter (Signed)
PA approved, form faxed back to pharmacy. 

## 2016-06-14 ENCOUNTER — Telehealth: Payer: Self-pay | Admitting: Family Medicine

## 2016-06-14 NOTE — Telephone Encounter (Signed)
Pt states the new  metformin (FORTAMET) 1000 MG (OSM) 24 hr tablet  Cost her $130 and she can not afford.   Pt states she has enough of the  Old metformin to last 3 months, so she will return to taking that again

## 2016-06-16 ENCOUNTER — Telehealth (HOSPITAL_COMMUNITY): Payer: Self-pay | Admitting: Radiology

## 2016-06-16 NOTE — Telephone Encounter (Signed)
Called to schedule echocardiogram 

## 2016-06-19 NOTE — Telephone Encounter (Signed)
Ok - please send rx for regular metformin 100mg  daily. Thanks.

## 2016-06-20 MED ORDER — METFORMIN HCL 1000 MG PO TABS: 1000.0000 mg | ORAL_TABLET | Freq: Every day | ORAL | 1 refills | Status: DC

## 2016-06-20 NOTE — Telephone Encounter (Signed)
Rx done. 

## 2016-06-21 ENCOUNTER — Institutional Professional Consult (permissible substitution): Payer: Medicare Other | Admitting: Pulmonary Disease

## 2016-06-27 ENCOUNTER — Ambulatory Visit: Payer: Medicare Other

## 2016-07-06 ENCOUNTER — Other Ambulatory Visit: Payer: Self-pay | Admitting: Cardiovascular Disease

## 2016-07-06 DIAGNOSIS — I10 Essential (primary) hypertension: Secondary | ICD-10-CM

## 2016-07-18 ENCOUNTER — Encounter: Payer: Self-pay | Admitting: Family Medicine

## 2016-07-18 LAB — FECAL OCCULT BLOOD, GUAIAC: Fecal Occult Blood: NEGATIVE

## 2016-08-01 ENCOUNTER — Encounter: Payer: Self-pay | Admitting: Family Medicine

## 2016-08-07 ENCOUNTER — Ambulatory Visit (INDEPENDENT_AMBULATORY_CARE_PROVIDER_SITE_OTHER): Payer: Medicare Other | Admitting: Pulmonary Disease

## 2016-08-07 ENCOUNTER — Encounter: Payer: Self-pay | Admitting: Pulmonary Disease

## 2016-08-07 VITALS — BP 162/98 | HR 74 | Ht 70.5 in | Wt 225.0 lb

## 2016-08-07 DIAGNOSIS — G4731 Primary central sleep apnea: Secondary | ICD-10-CM | POA: Diagnosis not present

## 2016-08-07 NOTE — Progress Notes (Signed)
Past Surgical History She  has a past surgical history that includes Total abdominal hysterectomy; Thyroidectomy; Tonsillectomy; and Cardiac catheterization (N/A, 10/08/2014).  Allergies  Allergen Reactions  . Ibuprofen Nausea Only    Family History Her family history includes Diabetes in her mother; Diabetes type II in her mother; Heart disease in her mother; Hyperlipidemia in her father; Hypertension in her mother; Other in her father.  Social History She  reports that she has been smoking Cigarettes.  She has a 10.00 pack-year smoking history. She has never used smokeless tobacco. She reports that she does not drink alcohol or use drugs.  Review of systems Constitutional: Negative for fever and unexpected weight change.  HENT: Positive for congestion. Negative for dental problem, ear pain, nosebleeds, postnasal drip, rhinorrhea, sinus pressure, sneezing, sore throat and trouble swallowing.   Eyes: Negative for redness and itching.  Respiratory: Positive for shortness of breath. Negative for cough, chest tightness and wheezing.   Cardiovascular: Negative for palpitations and leg swelling.  Gastrointestinal: Negative for nausea and vomiting.       Acid heartburn  Genitourinary: Negative for dysuria.  Musculoskeletal: Negative for joint swelling.  Skin: Negative for rash.  Neurological: Negative for headaches.  Hematological: Does not bruise/bleed easily.  Psychiatric/Behavioral: Negative for dysphoric mood. The patient is not nervous/anxious.     Current Outpatient Prescriptions on File Prior to Visit  Medication Sig  . aspirin 81 MG tablet Take 81 mg by mouth daily.    Marland Kitchen atorvastatin (LIPITOR) 40 MG tablet Take 1.5 tablets (60 mg total) by mouth daily.  . carvedilol (COREG) 25 MG tablet Take 1 tablet (25 mg total) by mouth 2 (two) times daily with a meal.  . furosemide (LASIX) 40 MG tablet Take 1 tablet (40 mg total) by mouth 2 (two) times daily.  Marland Kitchen glipiZIDE (GLUCOTROL XL) 2.5 MG  24 hr tablet Take 1 tablet (2.5 mg total) by mouth daily with breakfast.  . glucose blood (ONETOUCH VERIO) test strip Use as instructed to check blood sugar once a day  . hydrALAZINE (APRESOLINE) 50 MG tablet take 1 tablet by mouth three times a day  . isosorbide mononitrate (IMDUR) 30 MG 24 hr tablet take 2 tablet by mouth once daily  . levothyroxine (SYNTHROID, LEVOTHROID) 137 MCG tablet Take 1 tablet (137 mcg total) by mouth daily before breakfast.  . metFORMIN (GLUCOPHAGE) 1000 MG tablet Take 1 tablet (1,000 mg total) by mouth daily with breakfast.  . ONETOUCH DELICA LANCETS 88E MISC 1 Device by Other route daily.  . sacubitril-valsartan (ENTRESTO) 49-51 MG Take 1 tablet by mouth 2 (two) times daily.  Marland Kitchen spironolactone (ALDACTONE) 50 MG tablet Take 1 tablet (50 mg total) by mouth daily.   No current facility-administered medications on file prior to visit.     Chief Complaint  Patient presents with  . SLEEP CONSULT    Referred by Dr Melvyn Novas. Currently uses CPAP machine. Sleep study about 3 years ago. DME [facility].   Epworth Score: 1    Sleep tests PSG 09/11/12 >> AHI 68.6  Cardiac tests Echo 07/22/14 >> EF 30 to 35%, mod LVH, grade 1 DD, mild MR, mod RV systolic dysfx  Past medical history She  has a past medical history of AICD (automatic cardioverter/defibrillator) present (10/08/2014); CHF (congestive heart failure) ([place]); Diabetes mellitus without complication ([place]); History of cardiac cath (05/2007); History of colonoscopy; History of hiatal hernia; Hypertension; Iatrogenic thyroiditis; Non-ischemic cardiomyopathy ([place]); PAD (peripheral artery disease) ([place]); Personal history of goiter; S/P radioactive  iodine thyroid ablation; S/P thyroidectomy; and Shortness of breath dyspnea.  Vital signs BP (!) 162/98 (BP Location: Right Arm, Cuff Size: Large)   Pulse 74   Ht 5' 10.5" (1.791 m)   Wt 225 lb (102.1 kg)   SpO2 97%   BMI 31.83 kg/m   History of Present Illness Paula Stein is a  71 y.o. female for evaluation of sleep problems.  She was seen by Dr. Gwenette Greet in 2014.  She had sleep study which showed severe sleep apnea.  She was started on PAP therapy.  Her weight has fluctuated, and she isn't sure if she needs PAP or what settings she needs.  She was more recently seen by Dr. Melvyn Novas, and advised to have formal sleep consultation.  She tries using machine, but doesn't feel like pressure is right.  She is a light sleeper.  She snores w/o PAP.  She has to sit up in bed when not using machine.  She goes to sleep at 11 pm.  She falls asleep after 30 minutes.  She wakes up 2 or 3 times to use the bathroom.  She gets out of bed at 645 am.  She feels okay in the morning.  She denies morning headache.  She does not use anything to help her stay awake.  She has tried using melatonin and ambien, but these didn't help.    She denies sleep walking, sleep talking, bruxism, or nightmares.  There is no history of restless legs.  She denies sleep hallucinations, sleep paralysis, or cataplexy.  The Epworth score is 1 out of 24.   Physical Exam:  General - No distress ENT - No sinus tenderness, no oral exudate, no LAN, no thyromegaly, TM clear, pupils equal/reactive, wears dentures, MP 3 Cardiac - s1s2 regular, no murmur, pulses symmetric Chest - No wheeze/rales/dullness, good air entry, normal respiratory excursion Back - No focal tenderness Abd - Soft, non-tender, no organomegaly, + bowel sounds Ext - No edema Neuro - Normal strength, cranial nerves intact Skin - No rashes Psych - Normal mood, and behavior  Discussion: She has history of complex sleep apnea.  She has trouble adjusting to PAP therapy.  She has fluctuating weight since original set up.  We discussed how sleep apnea can affect various health problems, including risks for hypertension, cardiovascular disease, and diabetes.  We also discussed how sleep disruption can increase risks for accidents, such as while driving.   Weight loss as a means of improving sleep apnea was also reviewed.  Additional treatment options discussed were CPAP therapy, oral appliance, and surgical intervention.  Assessment/plan:  Complex sleep apnea. - will arrange for in lab titration study - will start on CPAP and transition to Bipap +/- supplemental oxygen as needed   Patient Instructions  Will arrange for titration sleep study Will call to arrange for follow up after sleep study reviewed     Chesley Mires, M.D. Pager [phone] 08/07/2016, 10:27 AM

## 2016-08-07 NOTE — Patient Instructions (Signed)
Will arrange for titration sleep study Will call to arrange for follow up after sleep study reviewed

## 2016-08-07 NOTE — Progress Notes (Signed)
   Subjective:    Patient ID: Paula Stein, female    DOB: 1946-01-06, 71 y.o.   MRN: [id number]  HPI    Review of Systems  Constitutional: Negative for fever and unexpected weight change.  HENT: Positive for congestion. Negative for dental problem, ear pain, nosebleeds, postnasal drip, rhinorrhea, sinus pressure, sneezing, sore throat and trouble swallowing.   Eyes: Negative for redness and itching.  Respiratory: Positive for shortness of breath. Negative for cough, chest tightness and wheezing.   Cardiovascular: Negative for palpitations and leg swelling.  Gastrointestinal: Negative for nausea and vomiting.       Acid heartburn  Genitourinary: Negative for dysuria.  Musculoskeletal: Negative for joint swelling.  Skin: Negative for rash.  Neurological: Negative for headaches.  Hematological: Does not bruise/bleed easily.  Psychiatric/Behavioral: Negative for dysphoric mood. The patient is not nervous/anxious.        Objective:   Physical Exam        Assessment & Plan:

## 2016-08-21 ENCOUNTER — Encounter (INDEPENDENT_AMBULATORY_CARE_PROVIDER_SITE_OTHER): Payer: Self-pay | Admitting: Physician Assistant

## 2016-08-21 ENCOUNTER — Other Ambulatory Visit (INDEPENDENT_AMBULATORY_CARE_PROVIDER_SITE_OTHER): Payer: Self-pay | Admitting: Physician Assistant

## 2016-08-21 ENCOUNTER — Ambulatory Visit (INDEPENDENT_AMBULATORY_CARE_PROVIDER_SITE_OTHER): Payer: Medicare Other | Admitting: Physician Assistant

## 2016-08-21 ENCOUNTER — Other Ambulatory Visit: Payer: Self-pay | Admitting: Internal Medicine

## 2016-08-21 VITALS — BP 156/88 | HR 78 | Temp 97.3°F | Ht 70.5 in | Wt 222.2 lb

## 2016-08-21 DIAGNOSIS — E1142 Type 2 diabetes mellitus with diabetic polyneuropathy: Secondary | ICD-10-CM | POA: Diagnosis not present

## 2016-08-21 DIAGNOSIS — I10 Essential (primary) hypertension: Secondary | ICD-10-CM

## 2016-08-21 DIAGNOSIS — L910 Hypertrophic scar: Secondary | ICD-10-CM | POA: Diagnosis not present

## 2016-08-21 DIAGNOSIS — J302 Other seasonal allergic rhinitis: Secondary | ICD-10-CM | POA: Diagnosis not present

## 2016-08-21 DIAGNOSIS — Z1231 Encounter for screening mammogram for malignant neoplasm of breast: Secondary | ICD-10-CM

## 2016-08-21 DIAGNOSIS — R059 Cough, unspecified: Secondary | ICD-10-CM

## 2016-08-21 DIAGNOSIS — R05 Cough: Secondary | ICD-10-CM

## 2016-08-21 LAB — POCT GLYCOSYLATED HEMOGLOBIN (HGB A1C): HEMOGLOBIN A1C: 7

## 2016-08-21 MED ORDER — FLUTICASONE PROPIONATE 50 MCG/ACT NA SUSP: 2.0000 | Freq: Every day | NASAL | 6 refills | Status: DC

## 2016-08-21 MED ORDER — DEXTROMETHORPHAN POLISTIREX ER 30 MG/5ML PO SUER: 15.0000 mg | Freq: Two times a day (BID) | ORAL | 0 refills | Status: DC

## 2016-08-21 MED ORDER — LEVOCETIRIZINE DIHYDROCHLORIDE 5 MG PO TABS: 5.0000 mg | ORAL_TABLET | Freq: Every evening | ORAL | 0 refills | Status: DC

## 2016-08-21 NOTE — Patient Instructions (Signed)
Managing Your Hypertension Hypertension is commonly called high blood pressure. This is when the force of your blood pressing against the walls of your arteries is too strong. Arteries are blood vessels that carry blood from your heart throughout your body. Hypertension forces the heart to work harder to pump blood, and may cause the arteries to become narrow or stiff. Having untreated or uncontrolled hypertension can cause heart attack, stroke, kidney disease, and other problems. What are blood pressure readings? A blood pressure reading consists of a higher number over a lower number. Ideally, your blood pressure should be below 120/80. The first ("top") number is called the systolic pressure. It is a measure of the pressure in your arteries as your heart beats. The second ("bottom") number is called the diastolic pressure. It is a measure of the pressure in your arteries as the heart relaxes. What does my blood pressure reading mean? Blood pressure is classified into four stages. Based on your blood pressure reading, your health care provider may use the following stages to determine what type of treatment you need, if any. Systolic pressure and diastolic pressure are measured in a unit called mm Hg. Normal   Systolic pressure: below 120.  Diastolic pressure: below 80. Elevated   Systolic pressure: 120-129.  Diastolic pressure: below 80. Hypertension stage 1     Diastolic pressure: 80-89. Hypertension stage 2   Systolic pressure: 140 or above.  Diastolic pressure: 90 or above. What health risks are associated with hypertension? Managing your hypertension is an important responsibility. Uncontrolled hypertension can lead to:  A heart attack.  A stroke.  A weakened blood vessel (aneurysm).  Heart failure.  Kidney damage.  Eye damage.  Metabolic syndrome.  Memory and concentration problems. What changes can I make to manage my hypertension? Eating and drinking   Eat a  diet that is high in fiber and potassium, and low in salt (sodium), added sugar, and fat. An example eating plan is called the DASH (Dietary Approaches to Stop Hypertension) diet. To eat this way:  Eat plenty of fresh fruits and vegetables. Try to fill half of your plate at each meal with fruits and vegetables.  Eat whole grains, such as whole wheat pasta, brown rice, or whole grain bread. Fill about one quarter of your plate with whole grains.  Eat low-fat diary products.  Avoid fatty cuts of meat, processed or cured meats, and poultry with skin. Fill about one quarter of your plate with lean proteins such as fish, chicken without skin, beans, eggs, and tofu.  Avoid premade and processed foods. These tend to be higher in sodium, added sugar, and fat.     Lifestyle   Work with your health care provider to maintain a healthy body weight, or to lose weight. Ask what an ideal weight is for you.  Get at least 30 minutes of exercise that causes your heart to beat faster (aerobic exercise) most days of the week. Activities may include walking, swimming, or biking.       Monitoring   Monitor your blood pressure at home as told by your health care provider. Your personal target blood pressure may vary depending on your medical conditions, your age, and other factors.  Have your blood pressure checked regularly, as often as told by your health care provider. Working with your health care provider   Review all the medicines you take with your health care provider because there may be side effects or interactions.  Talk with your health   care provider about your diet, exercise habits, and other lifestyle factors that may be contributing to hypertension.  Visit your health care provider regularly. Your health care provider can help you create and adjust your plan for managing hypertension. Will I need medicine to control my blood pressure? Your health care provider may prescribe medicine if  lifestyle changes are not enough to get your blood pressure under control, and if:  Your systolic blood pressure is 130 or higher.  Your diastolic blood pressure is 80 or higher. Take medicines only as told by your health care provider. Follow the directions carefully. Blood pressure medicines must be taken as prescribed. The medicine does not work as well when you skip doses. Skipping doses also puts you at risk for problems. Contact a health care provider if:  You think you are having a reaction to medicines you have taken.  You have repeated (recurrent) headaches.  You feel dizzy.  You have swelling in your ankles.  You have trouble with your vision. Get help right away if:  You develop a severe headache or confusion.  You have unusual weakness or numbness, or you feel faint.  You have severe pain in your chest or abdomen.  You vomit repeatedly.  You have trouble breathing. Summary  Hypertension is when the force of blood pumping through your arteries is too strong. If this condition is not controlled, it may put you at risk for serious complications.  Your personal target blood pressure may vary depending on your medical conditions, your age, and other factors. For most people, a normal blood pressure is less than 120/80.  Hypertension is managed by lifestyle changes, medicines, or both. Lifestyle changes include weight loss, eating a healthy, low-sodium diet, exercising more, and limiting alcohol. This information is not intended to replace advice given to you by your health care provider. Make sure you discuss any questions you have with your health care provider. Document Released: 12/13/2011 Document Revised: 02/16/2016 Document Reviewed: 02/16/2016 Elsevier Interactive Patient Education  2017 [employer]  

## 2016-08-21 NOTE — Progress Notes (Signed)
Subjective:  Patient ID: Paula Stein, female    DOB: Jan 06, 1946  Age: 71 y.o. MRN: [id number]  CC: cough  HPI Paula Stein is a 71 y.o. female with a PMH of of multiple comorbidites presents with a cough. Onset of cough x2 days. Was working on her yard previously. She is concerned because mucus has a different color. There is also tightness between the eyes. Has nasal congestion, a cough, and "sticking eyes".    BP found to be elevated today. Reports good control usually. Has not taken her BP meds today. Does not endorse any other symptoms.     Outpatient Medications Prior to Visit  Medication Sig Dispense Refill  . aspirin 81 MG tablet Take 81 mg by mouth daily.      Marland Kitchen atorvastatin (LIPITOR) 40 MG tablet Take 1.5 tablets (60 mg total) by mouth daily. 135 tablet 3  . carvedilol (COREG) 25 MG tablet Take 1 tablet (25 mg total) by mouth 2 (two) times daily with a meal. 180 tablet 3  . furosemide (LASIX) 40 MG tablet Take 1 tablet (40 mg total) by mouth 2 (two) times daily. 180 tablet 3  . glipiZIDE (GLUCOTROL XL) 2.5 MG 24 hr tablet Take 1 tablet (2.5 mg total) by mouth daily with breakfast. 90 tablet 3  . glucose blood (ONETOUCH VERIO) test strip Use as instructed to check blood sugar once a day 100 each 3  . hydrALAZINE (APRESOLINE) 50 MG tablet take 1 tablet by mouth three times a day 270 tablet 1  . isosorbide mononitrate (IMDUR) 30 MG 24 hr tablet take 2 tablet by mouth once daily 180 tablet 3  . levothyroxine (SYNTHROID, LEVOTHROID) 137 MCG tablet Take 1 tablet (137 mcg total) by mouth daily before breakfast. 90 tablet 1  . metFORMIN (GLUCOPHAGE) 1000 MG tablet Take 1 tablet (1,000 mg total) by mouth daily with breakfast. 90 tablet 1  . ONETOUCH DELICA LANCETS 54M MISC 1 Device by Other route daily. 100 each 3  . sacubitril-valsartan (ENTRESTO) 49-51 MG Take 1 tablet by mouth 2 (two) times daily. 60 tablet 2  . spironolactone (ALDACTONE) 50 MG tablet Take 1 tablet (50 mg total) by  mouth daily. 30 tablet 11   No facility-administered medications prior to visit.      ROS Review of Systems  Constitutional: Negative for chills, fever and malaise/fatigue.  Eyes: Negative for blurred vision.  Respiratory: Negative for shortness of breath.   Cardiovascular: Negative for chest pain and palpitations.  Gastrointestinal: Negative for abdominal pain and nausea.  Genitourinary: Negative for dysuria and hematuria.  Musculoskeletal: Negative for joint pain and myalgias.  Skin: Negative for rash.  Neurological: Negative for tingling and headaches.  Psychiatric/Behavioral: Negative for depression. The patient is not nervous/anxious.     Objective:  BP (!) 156/88   Pulse 78   Temp 97.3 F (36.3 C) (Oral)   Ht 5' 10.5" (1.791 m)   Wt 222 lb 3.2 oz (100.8 kg)   SpO2 93%   BMI 31.43 kg/m   BP/Weight 08/21/2016 08/07/2016 0/86/7619  Systolic BP 509 326 -  Diastolic BP 88 98 -  Wt. (Lbs) 222.2 225 228  BMI 31.43 31.83 32.71      Physical Exam  Constitutional: She is oriented to person, place, and time.  Well developed, overweight, NAD, polite  HENT:  Head: Normocephalic and atraumatic.  Mouth/Throat: No oropharyngeal exudate.  Mild postnasal drip  Eyes: EOM are normal. Pupils are equal, round, and reactive to light.  Right eye exhibits no discharge. Left eye exhibits no discharge. Scleral icterus is present.  Neck: Normal range of motion. Neck supple. No thyromegaly present.  Cardiovascular: Normal rate, regular rhythm and normal heart sounds.   Pulmonary/Chest: Effort normal and breath sounds normal.  Musculoskeletal: She exhibits no edema.  Neurological: She is alert and oriented to person, place, and time. No cranial nerve deficit. Coordination normal.  Skin: Skin is warm and dry. No rash noted. No erythema. No pallor.  Psychiatric: She has a normal mood and affect. Her behavior is normal. Thought content normal.  Vitals reviewed.    Assessment & Plan:    1. Seasonal allergic rhinitis, unspecified trigger - Begin levocetirizine (XYZAL) 5 MG tablet; Take 1 tablet (5 mg total) by mouth every evening.  Dispense: 30 tablet; Refill: 0 - Begin fluticasone (FLONASE) 50 MCG/ACT nasal spray; Place 2 sprays into both nostrils daily.  Dispense: 16 g; Refill: 6  2. Cough - Beign dextromethorphan (DELSYM) 30 MG/5ML liquid; Take 2.5 mLs (15 mg total) by mouth 2 (two) times daily.  Dispense: 89 mL; Refill: 0  3. Type 2 diabetes mellitus with diabetic polyneuropathy, without long-term current use of insulin (HCC) - HgB A1c 7.0% in clinic today - Microalbumin/Creatinine Ratio, Urine - Ambulatory referral to Ophthalmology  4. 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 - Ambulatory referral to Dermatology  5. Hypertension, unspecified type - Pt has not taken her antihypertensives yet today. Reports taking medications at 10 o' clock everday. I have advised for her to return in two weeks for a f/u on BP.   Meds ordered this encounter  Medications  . levocetirizine (XYZAL) 5 MG tablet    Sig: Take 1 tablet (5 mg total) by mouth every evening.    Dispense:  30 tablet    Refill:  0    Order Specific Question:   Supervising Provider    Answer:   Tresa Garter [id number]  . fluticasone (FLONASE) 50 MCG/ACT nasal spray    Sig: Place 2 sprays into both nostrils daily.    Dispense:  16 g    Refill:  6    Order Specific Question:   Supervising Provider    Answer:   Tresa Garter [id number]  . dextromethorphan (DELSYM) 30 MG/5ML liquid    Sig: Take 2.5 mLs (15 mg total) by mouth 2 (two) times daily.    Dispense:  89 mL    Refill:  0    Order Specific Question:   Supervising Provider    Answer:   Tresa Garter [[id number]]    Follow-up: Return in about 2 weeks (around 09/04/2016) for f/u HTN and cough.   Clent Demark PA

## 2016-08-22 LAB — MICROALBUMIN / CREATININE URINE RATIO
Creatinine, Urine: 213.6 mg/dL
MICROALB/CREAT RATIO: 477.2 mg/g{creat} — AB (ref 0.0–30.0)
Microalbumin, Urine: 1019.4 ug/mL

## 2016-08-27 ENCOUNTER — Encounter (HOSPITAL_BASED_OUTPATIENT_CLINIC_OR_DEPARTMENT_OTHER): Payer: Medicare Other

## 2016-08-29 ENCOUNTER — Telehealth (INDEPENDENT_AMBULATORY_CARE_PROVIDER_SITE_OTHER): Payer: Self-pay | Admitting: *Deleted

## 2016-08-29 NOTE — Telephone Encounter (Signed)
-----   Message from Clent Demark, PA-C sent at 08/29/2016  8:34 AM EDT ----- Expelling proteins in the urine. She will need to have good control of diabetes. Repeat microalbumin in 6 months.

## 2016-08-29 NOTE — Telephone Encounter (Signed)
Patient verified DOB Patient is aware of protein being noted in her urine. Patient advised to control DM better and have a repeat microalbuminuria completed in 6 months. Patient expressed her understanding.

## 2016-08-30 ENCOUNTER — Ambulatory Visit (INDEPENDENT_AMBULATORY_CARE_PROVIDER_SITE_OTHER): Payer: Medicare Other | Admitting: *Deleted

## 2016-08-30 DIAGNOSIS — I428 Other cardiomyopathies: Secondary | ICD-10-CM | POA: Diagnosis not present

## 2016-08-30 NOTE — Progress Notes (Signed)
Subjective:   AKEIBA AXELSON is a 71 y.o. female who presents for Medicare Annual (Subsequent) preventive examination.  The Patient was informed that the wellness visit is to identify future health risk and educate and initiate measures that can reduce risk for increased disease through the lifespan.    NO ROS; Medicare Wellness Visit  Describes health as good, fair or great? Great   BP elevated but has not had medication  Takes thyroid first;  She takes her bp when she eats, which is later in the am and has not taken this am   Preventive Screening -Counseling & Management  Colonoscopy 08/2004- FOBT 07/2017; did the white box  Confirmed with [employer] she did not have colo-guard, will call and educate  Mammogram 08/2014; scheduled next week at the breast center  June 6th mammogram  June 13 sleep study  Dexa 05/2005  -2,5 To repeat the dexa; call to the breast center and they cannot get her dexa on Monday, but will call Ms. Porrata and schedule   Smoking history - Current every day smoker 10 pack years ( 2 to 3 cig a day) Was going to [place]ingo and was getting a lot of second hand smoke;  Stopped going  10 pack year hx  Smokeless tobacco no Second Hand Smoke status; No Smokers in the home ETOH - no    RISK FACTORS Diet A1c 7.5 to 7.0  Has oatmeal for breakfast; grits and boiled egg; [place] bacon, Cut back on the sausage patties Summer sometimes eat around 11:00  [place] sandwich; multi grain bread; ice tea; lipton peach tea Dinner; buy a lot of prepackaged meals;  Does freeze spaghetti sauce  Is monitoring sugar and sodium;    Regular exercise  Goes to [employer] and walks around for 30 minutes  Discussed silver sneaker; son lives Dance movement psychotherapist to sign up to increase her muscle tone; balance; etc    Cardiac Risk Factors:  Defibrillator Advanced aged ; >65 in women Hyperlipidemia -  Cho 250; HDL 58, LDL 167; trig 163  Checking BS qod; high this am 145 but  yesterday it was 122  GFR 68 Diabetes - DM 2; HTN , DM, HD,  Family History - mother had DM; HTN; and HD;  Obesity BMI 31    Fall risk - fell x 2 -  Within the last 2 weeks  Bought clogs and fell due to rubber soles; other fall due tp dog getting under her foot; fell over on couch; right knee was sore with the first fall  She watches her home for trip hazards; agrees that it is time to be more engaged in formal exercise    Given education on "Fall Prevention in the Home" for more safety tips the patient can apply as appropriate.  Long term goal is to "age in place" or son wants her to come live with him   Mobility of Functional changes this year? No; very active Safety; just drove to [place] by herself  Has cell phone on her all the time just in case she falls Bathroom grab bars and shower seat  wears sunscreen, is not in the sun much  safe place for firearms; keep in safe Motor vehicle accidents; no   Mental Health:  Any emotional problems? Anxious, depressed, irritable, sad or blue? no Denies feeling depressed or hopeless; voices pleasure in daily life How many social activities have you been engaged in within the last 2 weeks? no  Hearing Screening Comments: No hearing issues  Vision Screening Comments: Has eye exam scheduled for next Monday Out at [facility] on [address]    Activities of Daily Living - See functional screen   Cognitive testing; Ad8 score; 0 or less than 2  MMSE deferred or completed if AD8 + 2 issues  Advanced Directives yes  Patient Care Team: Lucretia Kern, DO as PCP - General (Family Medicine)   Immunization History  Administered Date(s) Administered  . Influenza Split 02/15/2011  . Influenza Whole 08/22/2004, 02/18/2007, 12/31/2008, 02/17/2010, 01/02/2012  . Influenza, High Dose Seasonal PF 01/04/2015  . Influenza,inj,Quad PF,36+ Mos 06/26/2013, 02/03/2014  . Influenza-Unspecified 02/02/2016  . Pneumococcal Conjugate-13 06/26/2013  . Pneumococcal  Polysaccharide-23 08/22/2004, 02/03/2014  . Td 12/22/2002  . Tdap 06/26/2013  . Zoster 07/24/2014   Required Immunizations needed today  Screening test up to date or reviewed for plan of completion Health Maintenance Due  Topic Date Due  . OPHTHALMOLOGY EXAM  02/21/2012  . MAMMOGRAM  08/19/2016     eye exam scheduled for next week  Mammogram is scheduled as well      Objective:     Vitals: BP (!) 158/80   Pulse 83   Ht 5\' 10"  (1.778 m)   Wt 219 lb (99.3 kg)   SpO2 95%   BMI 31.42 kg/m   Body mass index is 31.42 kg/m.   Tobacco History  Smoking Status  . Current Every Day Smoker  . Packs/day: 0.25  . Years: 40.00  . Types: Cigarettes  Smokeless Tobacco  . Never Used    Comment: Currently smokes about 1 pack every 2 weeks.  (5/7/8)     Ready to quit: Not Answered Counseling given: Yes she is not ready to quit but smokes 1 to 2 cig a day with no 2nd hand smoke  Past Medical History:  Diagnosis Date  . AICD (automatic cardioverter/defibrillator) present 10/08/2014   SUBQ    /    DR [name]  . CHF (congestive heart failure) ([place])   . Diabetes mellitus without complication ([place])   . History of cardiac cath 05/2007   normal-with patent coronaries  . History of colonoscopy   . History of hiatal hernia   . Hypertension   . Iatrogenic thyroiditis   . Non-ischemic cardiomyopathy ([place])    EF 28%- reassessment of LV function 2011 with LVEf 45-50%  . PAD (peripheral artery disease) (HCC)    lower extremities with ABIs of 0.5 bilaterally  . Personal history of goiter   . S/P radioactive iodine thyroid ablation   . S/P thyroidectomy   . Shortness of breath dyspnea    Past Surgical History:  Procedure Laterality Date  . EP IMPLANTABLE DEVICE N/A 10/08/2014   Procedure: SubQ ICD Implant;  Surgeon: [name], MD;  Location: [facility];  Service: Cardiovascular;  Laterality: N/A;  . THYROIDECTOMY    . TONSILLECTOMY    . TOTAL ABDOMINAL HYSTERECTOMY      Family History  Problem Relation Age of Onset  . Diabetes type II Mother   . Hypertension Mother   . Diabetes Mother   . Heart disease Mother   . Other Father        deceased from accident age 45  . Hyperlipidemia Father    History  Sexual Activity  . Sexual activity: Not Currently    Outpatient Encounter Prescriptions as of 08/31/2016  Medication Sig  . aspirin 81 MG tablet Take 81 mg by  mouth daily.    Marland Kitchen atorvastatin (LIPITOR) 40 MG tablet Take 1.5 tablets (60 mg total) by mouth daily.  . carvedilol (COREG) 25 MG tablet Take 1 tablet (25 mg total) by mouth 2 (two) times daily with a meal.  . fluticasone (FLONASE) 50 MCG/ACT nasal spray Place 2 sprays into both nostrils daily.  . furosemide (LASIX) 40 MG tablet Take 1 tablet (40 mg total) by mouth 2 (two) times daily.  Marland Kitchen glipiZIDE (GLUCOTROL XL) 2.5 MG 24 hr tablet Take 1 tablet (2.5 mg total) by mouth daily with breakfast.  . glucose blood (ONETOUCH VERIO) test strip Use as instructed to check blood sugar once a day  . hydrALAZINE (APRESOLINE) 50 MG tablet take 1 tablet by mouth three times a day  . isosorbide mononitrate (IMDUR) 30 MG 24 hr tablet take 2 tablet by mouth once daily  . levocetirizine (XYZAL) 5 MG tablet Take 1 tablet (5 mg total) by mouth every evening.  Marland Kitchen levothyroxine (SYNTHROID, LEVOTHROID) 137 MCG tablet Take 1 tablet (137 mcg total) by mouth daily before breakfast.  . metFORMIN (GLUCOPHAGE) 1000 MG tablet Take 1 tablet (1,000 mg total) by mouth daily with breakfast.  . ONETOUCH DELICA LANCETS 12Y MISC 1 Device by Other route daily.  . sacubitril-valsartan (ENTRESTO) 49-51 MG Take 1 tablet by mouth 2 (two) times daily.  Marland Kitchen spironolactone (ALDACTONE) 50 MG tablet Take 1 tablet (50 mg total) by mouth daily.  Marland Kitchen dextromethorphan (DELSYM) 30 MG/5ML liquid Take 2.5 mLs (15 mg total) by mouth 2 (two) times daily. (Patient not taking: Reported on 08/31/2016)   No facility-administered encounter medications on file as  of 08/31/2016.     Activities of Daily Living In your present state of health, do you have any difficulty performing the following activities: 08/31/2016  Hearing? N  Vision? N  Difficulty concentrating or making decisions? N  Walking or climbing stairs? N  Dressing or bathing? N  Doing errands, shopping? N  Preparing Food and eating ? N  Using the Toilet? N  In the past six months, have you accidently leaked urine? N  Do you have problems with loss of bowel control? N  Managing your Medications? N  Managing your Finances? N  Housekeeping or managing your Housekeeping? N  Some recent data might be hidden    Patient Care Team: Lucretia Kern, DO as PCP - General (Family Medicine)    Assessment:     Exercise Activities and Dietary recommendations Agrees to start exercising at the Y or her son's Y; Silver sneakers or program of choice     Goals    . Exercise 150 minutes per week (moderate activity)          Plan is to join grand-dtr around June 15th and go to the Y; [place] [address] By all means, start doing line dancing      Fall Risk Fall Risk  06/12/2016 09/09/2015 06/02/2015 07/13/2014  Falls in the past year? No No No No   Depression Screen PHQ 2/9 Scores 08/21/2016 06/12/2016 09/09/2015 06/02/2015  PHQ - 2 Score 0 0 0 0     Cognitive Function MMSE - Mini Mental State Exam 08/31/2016  Not completed: (No Data)    no issues with independent living     Immunization History  Administered Date(s) Administered  . Influenza Split 02/15/2011  . Influenza Whole 08/22/2004, 02/18/2007, 12/31/2008, 02/17/2010, 01/02/2012  . Influenza, High Dose Seasonal PF 01/04/2015  . Influenza,inj,Quad PF,36+ Mos 06/26/2013, 02/03/2014  .  Influenza-Unspecified 02/02/2016  . Pneumococcal Conjugate-13 06/26/2013  . Pneumococcal Polysaccharide-23 08/22/2004, 02/03/2014  . Td 12/22/2002  . Tdap 06/26/2013  . Zoster 07/24/2014   Screening Tests Health Maintenance  Topic Date Due  .  OPHTHALMOLOGY EXAM  02/21/2012  . MAMMOGRAM  08/19/2016  . INFLUENZA VACCINE  11/01/2016  . HEMOGLOBIN A1C  02/21/2017  . COLON CANCER SCREENING ANNUAL FOBT  07/18/2017  . FOOT EXAM  08/21/2017  . TETANUS/TDAP  06/27/2023  . DEXA SCAN  Completed  . Hepatitis C Screening  Completed  . PNA vac Low Risk Adult  Completed      Plan:      PCP Notes  Health Maintenance 1. This patient has FOB test in but thinks she had cologuard. Confirmed through [employer] that she did not have a cologuard. This was discussed with her but the patient thought the "insurer" did this with a home visit. Will plan to call and educate  2. Dexa score in 2007 was -2.5 in the spine. Agreed to repeat dexa with estrogen deficiency as no dx of osteoporosis noted;   3. BP elevated today but states she takes her BP around 10am and had not taken her BP med this am. Will check at home over the next 2 weeks and scheduled nurse visit in the afternoon in 2 weeks to recheck here at the office.   4. Had eye check scheduled for next week; Will request report to bring to the office   Abnormal Screens as given  Referrals none  Patient concerns; doing well; 2 recent falls were accidental, but agreed to exercise   Nurse Concerns; more exercise;  Other wise very active and living in a good neighborhood   Next PCP apt TBS      I have personally reviewed and noted the following in the patient's chart:   . Medical and social history . Use of alcohol, tobacco or illicit drugs  . Current medications and supplements . Functional ability and status . Nutritional status . Physical activity . Advanced directives . List of other physicians . Hospitalizations, surgeries, and ER visits in previous 12 months . Vitals . Screenings to include cognitive, depression, and falls . Referrals and appointments  In addition, I have reviewed and discussed with patient certain preventive protocols, quality metrics, and best  practice recommendations. A written personalized care plan for preventive services as well as general preventive health recommendations were provided to patient.     Wynetta Fines, RN  08/31/2016

## 2016-08-31 ENCOUNTER — Telehealth: Payer: Self-pay

## 2016-08-31 ENCOUNTER — Ambulatory Visit: Payer: Medicare Other | Admitting: Family Medicine

## 2016-08-31 ENCOUNTER — Ambulatory Visit (INDEPENDENT_AMBULATORY_CARE_PROVIDER_SITE_OTHER): Payer: Medicare Other

## 2016-08-31 VITALS — BP 158/80 | HR 83 | Ht 70.0 in | Wt 219.0 lb

## 2016-08-31 DIAGNOSIS — Z0000 Encounter for general adult medical examination without abnormal findings: Secondary | ICD-10-CM | POA: Diagnosis not present

## 2016-08-31 DIAGNOSIS — E2839 Other primary ovarian failure: Secondary | ICD-10-CM | POA: Diagnosis not present

## 2016-08-31 NOTE — Progress Notes (Signed)
Wandalene Abrams R., DO  

## 2016-08-31 NOTE — Patient Instructions (Addendum)
Ms. Laughter , Thank you for taking time to come for your Medicare Wellness Visit. I appreciate your ongoing commitment to your health goals. Please review the following plan we discussed and let me know if I can assist you in the future.   Recheck BP at home x 2 weeks If it continues to elevated over 140 then make apt with Dr. Maudie Mercury Will make a nurse visit for bp check in 2 weeks; June 14th   Minimal Blood Pressure Goal= AVERAGE < 140/90; Ideal is an AVERAGE < 135/85. This AVERAGE should be calculated from @ least 5-7 BP readings taken @ different times of day on different days of week. You should not respond to isolated BP readings , but rather the AVERAGE for that week .Please bring your blood pressure cuff to office visits to verify that it is reliable.It can also be checked against the blood pressure device at the pharmacy. Finger or wrist cuffs are not dependable; an arm cuff is.  Also, monitor sodium intake in label of any canned food, as well as sodium in common everyday condiments; ketchup; salad dressing, sauces and any fast food restaurant.   Will schedule your bone density with your mammogram June 6th (mammogram at 1:30)   Eye exam is scheduled for Monday! Please ask for a report so you can share that with [place]   Recommendations for Dexa Scan Female over the age of 63 Man age 67 or older If you broke a bone past the age of 61 Women menopausal age with risk factors (thin frame; smoker; hx of fx ) Post menopausal women under the age of 41 with risk factors A man age 36 to 31 with risk factors Other: Spine xray that is showing break of bone loss Back pain with possible break Height loss of 1/2 inch or more within one year Total loss in height of 1.5 inches from your original height  Calcium 1256m with Vit D 800u per day; more as directed by physician Strength building exercises discussed; can include walking; housework; small weights or stretch bands; [facility]  To bring in report from eye exam tbd next week  Mammogram is scheduled; the Breast center will call to schedule dexa;     These are the goals we discussed: Goals    . Exercise 150 minutes per week (moderate activity)          Plan is to join grand-dtr around [facility] By all means, start doing line dancing       This is a list of the screening recommended for you and due dates:  Health Maintenance  Topic Date Due  . Eye exam for diabetics  02/21/2012  . Mammogram  08/19/2016  . Flu Shot  11/01/2016  . Hemoglobin A1C  02/21/2017  . Stool Blood Test  07/18/2017  . Complete foot exam   08/21/2017  . Tetanus Vaccine  06/27/2023  . DEXA scan (bone density measurement)  Completed  .  Hepatitis C: One time screening is recommended by Center for Disease Control  (CDC) for  adults born from [birth year]through 1967.   Completed  . Pneumonia vaccines  Completed       Bone Densitometry Bone densitometry is an imaging test that uses a special X-ray to measure the amount of calcium and other minerals in your bones (bone density). This test is also known as a bone mineral density test or  dual-energy X-ray absorptiometry (DXA). The test can measure bone density at your hip and your spine. It is similar to having a regular X-ray. You may have this test to:  Diagnose a condition that causes weak or thin bones (osteoporosis).  Predict your risk of a broken bone (fracture).  Determine how well osteoporosis treatment is working.  Tell a health care provider about:  Any allergies you have.  All medicines you are taking, including vitamins, herbs, eye drops, creams, and over-the-counter medicines.  Any problems you or family members have had with anesthetic medicines.  Any blood disorders you have.  Any surgeries you have had.  Any medical conditions you have.  Possibility of pregnancy.  Any other medical test you had within the previous 14  days that used contrast material. What are the risks? Generally, this is a safe procedure. However, problems can occur and may include the following:  This test exposes you to a very small amount of radiation.  The risks of radiation exposure may be greater to unborn children.  What happens before the procedure?  Do not take any calcium supplements for 24 hours before having the test. You can otherwise eat and drink what you usually do.  Take off all metal jewelry, eyeglasses, dental appliances, and any other metal objects. What happens during the procedure?  You may lie on an exam table. There will be an X-ray generator below you and an imaging device above you.  Other devices, such as boxes or braces, may be used to position your body properly for the scan.  You will need to lie still while the machine slowly scans your body.  The images will show up on a computer monitor. What happens after the procedure? You may need more testing at a later time. This information is not intended to replace advice given to you by your health care provider. Make sure you discuss any questions you have with your health care provider. Document Released: 04/11/2004 Document Revised: 08/26/2015 Document Reviewed: 08/28/2013 Elsevier Interactive Patient Education  2018 [place] in the Home Falls can cause injuries. They can happen to people of all ages. There are many things you can do to make your home safe and to help prevent falls. What can I do on the outside of my home?  Regularly fix the edges of walkways and driveways and fix any cracks.  Remove anything that might make you trip as you walk through a door, such as a raised step or threshold.  Trim any bushes or trees on the path to your home.  Use bright outdoor lighting.  Clear any walking paths of anything that might make someone trip, such as rocks or tools.  Regularly check to see if handrails are loose or  broken. Make sure that both sides of any steps have handrails.  Any raised decks and porches should have guardrails on the edges.  Have any leaves, snow, or ice cleared regularly.  Use sand or salt on walking paths during winter.  Clean up any spills in your garage right away. This includes oil or grease spills. What can I do in the bathroom?  Use night lights.  Install grab bars by the toilet and in the tub and shower. Do not use towel bars as grab bars.  Use non-skid mats or decals in the tub or shower.  If you need to sit down in the shower, use a plastic, non-slip stool.  Keep the floor dry. Clean  up any water that spills on the floor as soon as it happens.  Remove soap buildup in the tub or shower regularly.  Attach bath mats securely with double-sided non-slip rug tape.  Do not have throw rugs and other things on the floor that can make you trip. What can I do in the bedroom?  Use night lights.  Make sure that you have a light by your bed that is easy to reach.  Do not use any sheets or blankets that are too big for your bed. They should not hang down onto the floor.  Have a firm chair that has side arms. You can use this for support while you get dressed.  Do not have throw rugs and other things on the floor that can make you trip. What can I do in the kitchen?  Clean up any spills right away.  Avoid walking on wet floors.  Keep items that you use a lot in easy-to-reach places.  If you need to reach something above you, use a strong step stool that has a grab bar.  Keep electrical cords out of the way.  Do not use floor polish or wax that makes floors slippery. If you must use wax, use non-skid floor wax.  Do not have throw rugs and other things on the floor that can make you trip. What can I do with my stairs?  Do not leave any items on the stairs.  Make sure that there are handrails on both sides of the stairs and use them. Fix handrails that are  broken or loose. Make sure that handrails are as long as the stairways.  Check any carpeting to make sure that it is firmly attached to the stairs. Fix any carpet that is loose or worn.  Avoid having throw rugs at the top or bottom of the stairs. If you do have throw rugs, attach them to the floor with carpet tape.  Make sure that you have a light switch at the top of the stairs and the bottom of the stairs. If you do not have them, ask someone to add them for you. What else can I do to help prevent falls?  Wear shoes that: ? Do not have high heels. ? Have rubber bottoms. ? Are comfortable and fit you well. ? Are closed at the toe. Do not wear sandals.  If you use a stepladder: ? Make sure that it is fully opened. Do not climb a closed stepladder. ? Make sure that both sides of the stepladder are locked into place. ? Ask someone to hold it for you, if possible.  Clearly mark and make sure that you can see: ? Any grab bars or handrails. ? First and last steps. ? Where the edge of each step is.  Use tools that help you move around (mobility aids) if they are needed. These include: ? Canes. ? Walkers. ? Scooters. ? Crutches.  Turn on the lights when you go into a dark area. Replace any light bulbs as soon as they burn out.  Set up your furniture so you have a clear path. Avoid moving your furniture around.  If any of your floors are uneven, fix them.  If there are any pets around you, be aware of where they are.  Review your medicines with your doctor. Some medicines can make you feel dizzy. This can increase your chance of falling. Ask your doctor what other things that you can do to help prevent falls. This  information is not intended to replace advice given to you by your health care provider. Make sure you discuss any questions you have with your health care provider. Document Released: 01/14/2009 Document Revised: 08/26/2015 Document Reviewed: 04/24/2014 Elsevier  Interactive Patient Education  2018 [place] Maintenance, Female Adopting a healthy lifestyle and getting preventive care can go a long way to promote health and wellness. Talk with your health care provider about what schedule of regular examinations is right for you. This is a good chance for you to check in with your provider about disease prevention and staying healthy. In between checkups, there are plenty of things you can do on your own. Experts have done a lot of research about which lifestyle changes and preventive measures are most likely to keep you healthy. Ask your health care provider for more information. Weight and diet Eat a healthy diet  Be sure to include plenty of vegetables, fruits, low-fat dairy products, and lean protein.  Do not eat a lot of foods high in solid fats, added sugars, or salt.  Get regular exercise. This is one of the most important things you can do for your health. ? Most adults should exercise for at least 150 minutes each week. The exercise should increase your heart rate and make you sweat (moderate-intensity exercise). ? Most adults should also do strengthening exercises at least twice a week. This is in addition to the moderate-intensity exercise.  Maintain a healthy weight  Body mass index (BMI) is a measurement that can be used to identify possible weight problems. It estimates body fat based on height and weight. Your health care provider can help determine your BMI and help you achieve or maintain a healthy weight.  For females 83 years of age and older: ? A BMI below 18.5 is considered underweight. ? A BMI of 18.5 to 24.9 is normal. ? A BMI of 25 to 29.9 is considered overweight. ? A BMI of 30 and above is considered obese.  Watch levels of cholesterol and blood lipids  You should start having your blood tested for lipids and cholesterol at 73 years of age, then have this test every 5 years.  You may need to have your  cholesterol levels checked more often if: ? Your lipid or cholesterol levels are high. ? You are older than 73 years of age. ? You are at high risk for heart disease.  Cancer screening Lung Cancer  Lung cancer screening is recommended for adults 86-45 years old who are at high risk for lung cancer because of a history of smoking.  A yearly low-dose CT scan of the lungs is recommended for people who: ? Currently smoke. ? Have quit within the past 15 years. ? Have at least a 30-pack-year history of smoking. A pack year is smoking an average of one pack of cigarettes a day for 1 year.  Yearly screening should continue until it has been 15 years since you quit.  Yearly screening should stop if you develop a health problem that would prevent you from having lung cancer treatment.  Breast Cancer  Practice breast self-awareness. This means understanding how your breasts normally appear and feel.  It also means doing regular breast self-exams. Let your health care provider know about any changes, no matter how small.  If you are in your 20s or 30s, you should have a clinical breast exam (CBE) by a health care provider every 1-3 years as part of a regular health exam.  If you are 40 or older, have a CBE every year. Also consider having a breast X-ray (mammogram) every year.  If you have a family history of breast cancer, talk to your health care provider about genetic screening.  If you are at high risk for breast cancer, talk to your health care provider about having an MRI and a mammogram every year.  Breast cancer gene (BRCA) assessment is recommended for women who have family members with BRCA-related cancers. BRCA-related cancers include: ? Breast. ? Ovarian. ? Tubal. ? Peritoneal cancers.  Results of the assessment will determine the need for genetic counseling and BRCA1 and BRCA2 testing.  Cervical Cancer Your health care provider may recommend that you be screened regularly  for cancer of the pelvic organs (ovaries, uterus, and vagina). This screening involves a pelvic examination, including checking for microscopic changes to the surface of your cervix (Pap test). You may be encouraged to have this screening done every 3 years, beginning at age 52.  For women ages 30-65, health care providers may recommend pelvic exams and Pap testing every 3 years, or they may recommend the Pap and pelvic exam, combined with testing for human papilloma virus (HPV), every 5 years. Some types of HPV increase your risk of cervical cancer. Testing for HPV may also be done on women of any age with unclear Pap test results.  Other health care providers may not recommend any screening for nonpregnant women who are considered low risk for pelvic cancer and who do not have symptoms. Ask your health care provider if a screening pelvic exam is right for you.  If you have had past treatment for cervical cancer or a condition that could lead to cancer, you need Pap tests and screening for cancer for at least 20 years after your treatment. If Pap tests have been discontinued, your risk factors (such as having a new sexual partner) need to be reassessed to determine if screening should resume. Some women have medical problems that increase the chance of getting cervical cancer. In these cases, your health care provider may recommend more frequent screening and Pap tests.  Colorectal Cancer  This type of cancer can be detected and often prevented.  Routine colorectal cancer screening usually begins at 73 years of age and continues through 73 years of age.  Your health care provider may recommend screening at an earlier age if you have risk factors for colon cancer.  Your health care provider may also recommend using home test kits to check for hidden blood in the stool.  A small camera at the end of a tube can be used to examine your colon directly (sigmoidoscopy or colonoscopy). This is done to  check for the earliest forms of colorectal cancer.  Routine screening usually begins at age 12.  Direct examination of the colon should be repeated every 5-10 years through 73 years of age. However, you may need to be screened more often if early forms of precancerous polyps or small growths are found.  Skin Cancer  Check your skin from head to toe regularly.  Tell your health care provider about any new moles or changes in moles, especially if there is a change in a mole's shape or color.  Also tell your health care provider if you have a mole that is larger than the size of a pencil eraser.  Always use sunscreen. Apply sunscreen liberally and repeatedly throughout the day.  Protect yourself by wearing long sleeves, pants, a wide-brimmed hat, and  sunglasses whenever you are outside.  Heart disease, diabetes, and high blood pressure  High blood pressure causes heart disease and increases the risk of stroke. High blood pressure is more likely to develop in: ? People who have blood pressure in the high end of the normal range (130-139/85-89 mm Hg). ? People who are overweight or obese. ? People who are African American.  If you are 28-54 years of age, have your blood pressure checked every 3-5 years. If you are 47 years of age or older, have your blood pressure checked every year. You should have your blood pressure measured twice-once when you are at a hospital or clinic, and once when you are not at a hospital or clinic. Record the average of the two measurements. To check your blood pressure when you are not at a hospital or clinic, you can use: ? An automated blood pressure machine at a pharmacy. ? A home blood pressure monitor.  If you are between 38 years and 51 years old, ask your health care provider if you should take aspirin to prevent strokes.  Have regular diabetes screenings. This involves taking a blood sample to check your fasting blood sugar level. ? If you are at a  normal weight and have a low risk for diabetes, have this test once every three years after 73 years of age. ? If you are overweight and have a high risk for diabetes, consider being tested at a younger age or more often. Preventing infection Hepatitis B  If you have a higher risk for hepatitis B, you should be screened for this virus. You are considered at high risk for hepatitis B if: ? You were born in a country where hepatitis B is common. Ask your health care provider which countries are considered high risk. ? Your parents were born in a high-risk country, and you have not been immunized against hepatitis B (hepatitis B vaccine). ? You have HIV or AIDS. ? You use needles to inject street drugs. ? You live with someone who has hepatitis B. ? You have had sex with someone who has hepatitis B. ? You get hemodialysis treatment. ? You take certain medicines for conditions, including cancer, organ transplantation, and autoimmune conditions.  Hepatitis C  Blood testing is recommended for: ? Everyone born from [birth year] through 1967. ? Anyone with known risk factors for hepatitis C.  Sexually transmitted infections (STIs)  You should be screened for sexually transmitted infections (STIs) including gonorrhea and chlamydia if: ? You are sexually active and are younger than 73 years of age. ? You are older than 73 years of age and your health care provider tells you that you are at risk for this type of infection. ? Your sexual activity has changed since you were last screened and you are at an increased risk for chlamydia or gonorrhea. Ask your health care provider if you are at risk.  If you do not have HIV, but are at risk, it may be recommended that you take a prescription medicine daily to prevent HIV infection. This is called pre-exposure prophylaxis (PrEP). You are considered at risk if: ? You are sexually active and do not regularly use condoms or know the HIV status of your  partner(s). ? You take drugs by injection. ? You are sexually active with a partner who has HIV.  Talk with your health care provider about whether you are at high risk of being infected with HIV. If you choose to begin PrEP, you  should first be tested for HIV. You should then be tested every 3 months for as long as you are taking PrEP. Pregnancy  If you are premenopausal and you may become pregnant, ask your health care provider about preconception counseling.  If you may become pregnant, take 400 to 800 micrograms (mcg) of folic acid every day.  If you want to prevent pregnancy, talk to your health care provider about birth control (contraception). Osteoporosis and menopause  Osteoporosis is a disease in which the bones lose minerals and strength with aging. This can result in serious bone fractures. Your risk for osteoporosis can be identified using a bone density scan.  If you are 92 years of age or older, or if you are at risk for osteoporosis and fractures, ask your health care provider if you should be screened.  Ask your health care provider whether you should take a calcium or vitamin D supplement to lower your risk for osteoporosis.  Menopause may have certain physical symptoms and risks.  Hormone replacement therapy may reduce some of these symptoms and risks. Talk to your health care provider about whether hormone replacement therapy is right for you. Follow these instructions at home:  Schedule regular health, dental, and eye exams.  Stay current with your immunizations.  Do not use any tobacco products including cigarettes, chewing tobacco, or electronic cigarettes.  If you are pregnant, do not drink alcohol.  If you are breastfeeding, limit how much and how often you drink alcohol.  Limit alcohol intake to no more than 1 drink per day for nonpregnant women. One drink equals 12 ounces of beer, 5 ounces of wine, or 1 ounces of hard liquor.  Do not use street  drugs.  Do not share needles.  Ask your health care provider for help if you need support or information about quitting drugs.  Tell your health care provider if you often feel depressed.  Tell your health care provider if you have ever been abused or do not feel safe at home. This information is not intended to replace advice given to you by your health care provider. Make sure you discuss any questions you have with your health care provider. Document Released: 10/03/2010 Document Revised: 08/26/2015 Document Reviewed: 12/22/2014 Elsevier Interactive Patient Education  [employer].

## 2016-08-31 NOTE — Progress Notes (Signed)
Remote ICD transmission.   

## 2016-08-31 NOTE — Telephone Encounter (Signed)
Call to Paula Stein She confirmed call from the Breast center and she was scheduled for dexa but could not have with mammogram due to being booked up  Also stated that she did not have the cologuard.  There is only one provider and they have not rec'd an order for her.   Offered to send out information on the cologuard and she can discuss with Dr. Maudie Mercury if she would like to pursue and she agreed.  sh

## 2016-09-04 ENCOUNTER — Encounter: Payer: Self-pay | Admitting: Family Medicine

## 2016-09-04 LAB — HM DIABETES EYE EXAM

## 2016-09-06 ENCOUNTER — Ambulatory Visit: Payer: Medicare Other

## 2016-09-07 LAB — CUP PACEART REMOTE DEVICE CHECK
Date Time Interrogation Session: 20180607125115
Implantable Lead Location: 753858
Implantable Lead Model: 3401
MDC IDC LEAD IMPLANT DT: 20160707
MDC IDC PG IMPLANT DT: 20160707
MDC IDC PG SERIAL: 115852

## 2016-09-08 ENCOUNTER — Encounter: Payer: Self-pay | Admitting: Cardiology

## 2016-09-11 ENCOUNTER — Ambulatory Visit (INDEPENDENT_AMBULATORY_CARE_PROVIDER_SITE_OTHER): Payer: Medicare Other | Admitting: Physician Assistant

## 2016-09-13 ENCOUNTER — Ambulatory Visit (HOSPITAL_BASED_OUTPATIENT_CLINIC_OR_DEPARTMENT_OTHER): Payer: Medicare Other | Attending: Pulmonary Disease | Admitting: Pulmonary Disease

## 2016-09-13 DIAGNOSIS — G4733 Obstructive sleep apnea (adult) (pediatric): Secondary | ICD-10-CM | POA: Diagnosis not present

## 2016-09-13 DIAGNOSIS — G4731 Primary central sleep apnea: Secondary | ICD-10-CM | POA: Diagnosis not present

## 2016-09-13 DIAGNOSIS — I493 Ventricular premature depolarization: Secondary | ICD-10-CM | POA: Diagnosis not present

## 2016-09-13 DIAGNOSIS — G4739 Other sleep apnea: Secondary | ICD-10-CM

## 2016-09-18 ENCOUNTER — Other Ambulatory Visit: Payer: Medicare Other

## 2016-09-18 ENCOUNTER — Ambulatory Visit: Payer: Medicare Other

## 2016-09-19 ENCOUNTER — Ambulatory Visit
Admission: RE | Admit: 2016-09-19 | Discharge: 2016-09-19 | Disposition: A | Discharge status: Home / Self Care | Payer: Medicare Other | Source: Ambulatory Visit | Attending: Physician Assistant | Admitting: Physician Assistant

## 2016-09-19 ENCOUNTER — Ambulatory Visit
Admission: RE | Admit: 2016-09-19 | Discharge: 2016-09-19 | Disposition: A | Discharge status: Home / Self Care | Payer: Medicare Other | Source: Ambulatory Visit | Attending: Family Medicine | Admitting: Family Medicine

## 2016-09-19 DIAGNOSIS — E2839 Other primary ovarian failure: Secondary | ICD-10-CM

## 2016-09-19 DIAGNOSIS — Z1231 Encounter for screening mammogram for malignant neoplasm of breast: Secondary | ICD-10-CM

## 2016-09-20 ENCOUNTER — Telehealth: Payer: Self-pay | Admitting: Pulmonary Disease

## 2016-09-20 DIAGNOSIS — G4733 Obstructive sleep apnea (adult) (pediatric): Secondary | ICD-10-CM

## 2016-09-20 DIAGNOSIS — G4731 Primary central sleep apnea: Secondary | ICD-10-CM | POA: Diagnosis not present

## 2016-09-20 DIAGNOSIS — G4739 Other sleep apnea: Secondary | ICD-10-CM

## 2016-09-20 NOTE — Telephone Encounter (Signed)
PSG 09/13/16 >> AHI 50.8, SpO2 low 86%.  Treatment emergent centrals.  Bipap 18/14 cm H2O   Will have my nurse inform pt that sleep study shows severe sleep apnea.  Please arrange for Bipap 18/14 cm H2O.  Schedule ROV with me or NP two months after getting Bipap.

## 2016-09-20 NOTE — Telephone Encounter (Signed)
Spoke with pt and notified of results per Dr.Sood. Pt verbalized understanding and denied any questions. She understands to call for 2 month rov with VS or NP  Order was sent to [facility]

## 2016-09-20 NOTE — Procedures (Signed)
   Patient Name: Paula Stein, Paula Stein Date: 09/13/2016 Gender: Female D.O.B: [birth year] Age (years): 26 Referring Provider: Chesley Mires MD, ABSM Height (inches): 70 Interpreting Physician: Chesley Mires MD, ABSM Weight (lbs): 218 RPSGT: Carolin Coy BMI: 31 MRN: [id number] Neck Size: 15.00  CLINICAL INFORMATION The patient is referred for a split night study with BPAP.  MEDICATIONS Medications self-administered by patient taken the night of the study : N/A  SLEEP STUDY TECHNIQUE As per the AASM Manual for the Scoring of Sleep and Associated Events v2.3 (April 2016) with a hypopnea requiring 4% desaturations.  The channels recorded and monitored were frontal, central and occipital EEG, electrooculogram (EOG), submentalis EMG (chin), nasal and oral airflow, thoracic and abdominal wall motion, anterior tibialis EMG, snore microphone, electrocardiogram, and pulse oximetry. Bi-level positive airway pressure (BiPAP) was initiated when the patient met split night criteria and was titrated according to treat sleep-disordered breathing.  RESPIRATORY PARAMETERS Diagnostic  Total AHI (/hr): 50.8 RDI (/hr): 51.5 OA Index (/hr): 1.1 CA Index (/hr): 38.1 REM AHI (/hr): 4.5 NREM AHI (/hr): 62.8 Supine AHI (/hr): 75.9 Non-supine AHI (/hr): 46.27 Min O2 Sat (%): 86.00 Mean O2 (%): 96.74 Time below 88% (min): 0.8     Titration  Optimal IPAP Pressure (cm): 18 Optimal EPAP Pressure (cm): 14 AHI at Optimal Pressure (/hr): 0.0 Min O2 at Optimal Pressure (%): 98.0 Sleep % at Optimal (%): 93 Supine % at Optimal (%): 0         During the titration portion of the study she was started on CPAP, but developed treatment emergent central apnea.  She was then transitioned to Bipap with improvement in control of sleep disordered breathing with setting of 18/14 cm H2O.   SLEEP ARCHITECTURE The study was initiated at 10:41:54 PM and terminated at 4:56:56 AM. The total recorded time was 375.0 minutes. EEG  confirmed total sleep time was 321.5 minutes yielding a sleep efficiency of 85.7%. Sleep onset after lights out was 6.9 minutes with a REM latency of 53.5 minutes. The patient spent 17.57% of the night in stage N1 sleep, 61.74% in stage N2 sleep, 0.00% in stage N3 and 20.68% in REM. Wake after sleep onset (WASO) was 46.7 minutes. The Arousal Index was 34.3/hour.  LEG MOVEMENT DATA The total Periodic Limb Movements of Sleep (PLMS) were 0. The PLMS index was 0.00 .  CARDIAC DATA The 2 lead EKG demonstrated sinus rhythm. The mean heart rate was 71.35 beats per minute. Other EKG findings include: PVCs.  IMPRESSIONS - Severe obstructive sleep apnea with an AHI of 50.8 and SpO2 low of 86%. - Treatment emergent central apnea. - She improved with Bipap 18/14 cm H2O, and did not require supplemental oxygen at this pressure setting.  DIAGNOSIS - Obstructive sleep apnea (G47.33) - Treatment emergent central sleep apnea (G47.31)  RECOMMENDATIONS - Trial of BiPAP therapy on 18/14 cm H2O. - She was fitted with a Medium size Resmed Full Face Mask AirFit F20 mask and heated humidification.  [Electronically signed] 09/20/2016 11:59 AM  Chesley Mires MD, ABSM Diplomate, American Board of Sleep Medicine   NPI: [id number]

## 2016-09-22 ENCOUNTER — Encounter: Payer: Self-pay | Admitting: Cardiology

## 2016-10-03 ENCOUNTER — Other Ambulatory Visit: Payer: Self-pay | Admitting: Cardiovascular Disease

## 2016-10-09 ENCOUNTER — Telehealth: Payer: Self-pay | Admitting: Cardiovascular Disease

## 2016-10-09 NOTE — Telephone Encounter (Signed)
New message    Pt states she is out of medication.    *STAT* If patient is at the pharmacy, call can be transferred to refill team.   1. Which medications need to be refilled? (please list name of each medication and dose if known) Entresto  2. Which pharmacy/location (including street and city if local pharmacy) is medication to be sent to? [place].  3. Do they need a 30 day or 90 day supply? 30 day

## 2016-10-09 NOTE — Telephone Encounter (Signed)
Medication Detail    Disp Refills Start End   sacubitril-valsartan (ENTRESTO) 49-51 MG 60 tablet 0 10/05/2016    Sig - Route: Take 1 tablet by mouth 2 (two) times daily. Overdue for follow up. Call and schedule for future refills [phone] - Oral   Notes to Pharmacy: Patient needs to call and schedule follow up for future refill authorization   E-Prescribing Status: Receipt confirmed by pharmacy (10/05/2016 10:22 AM EDT)   Pharmacy   [id number], [place]

## 2016-10-09 NOTE — Telephone Encounter (Signed)
This was sent in 10/05/16. I have called the pharmacy and was informed that this is ready for the patient to pick up. I called the patient but did not get an answer and the vm was unidentified.

## 2016-11-10 ENCOUNTER — Ambulatory Visit (INDEPENDENT_AMBULATORY_CARE_PROVIDER_SITE_OTHER)
Admission: RE | Admit: 2016-11-10 | Discharge: 2016-11-10 | Disposition: A | Discharge status: Home / Self Care | Payer: Medicare Other | Source: Ambulatory Visit | Attending: Family Medicine | Admitting: Family Medicine

## 2016-11-10 ENCOUNTER — Ambulatory Visit (INDEPENDENT_AMBULATORY_CARE_PROVIDER_SITE_OTHER): Payer: Medicare Other | Admitting: Family Medicine

## 2016-11-10 ENCOUNTER — Encounter: Payer: Self-pay | Admitting: Family Medicine

## 2016-11-10 VITALS — BP 110/80 | HR 76 | Temp 97.7°F | Ht 70.0 in | Wt 225.4 lb

## 2016-11-10 DIAGNOSIS — R059 Cough, unspecified: Secondary | ICD-10-CM

## 2016-11-10 DIAGNOSIS — R0981 Nasal congestion: Secondary | ICD-10-CM

## 2016-11-10 DIAGNOSIS — F17210 Nicotine dependence, cigarettes, uncomplicated: Secondary | ICD-10-CM

## 2016-11-10 DIAGNOSIS — R05 Cough: Secondary | ICD-10-CM

## 2016-11-10 MED ORDER — FLUTICASONE PROPIONATE 50 MCG/ACT NA SUSP: 2.0000 | Freq: Every day | NASAL | 6 refills | Status: DC

## 2016-11-10 MED ORDER — BENZONATATE 100 MG PO CAPS: 100.0000 mg | ORAL_CAPSULE | Freq: Three times a day (TID) | ORAL | 0 refills | Status: DC | PRN

## 2016-11-10 NOTE — Progress Notes (Signed)
HPI: Acute visit for cough and sinus congestion: -started: The last 2-3 days -symptoms:nasal congestion, sore throat, scratchy throat, sneezing, itchy watery eyes, cough - productive of white sputum at times, vomited once last night -denies:fever, SOB, wheezing, sinus pain NVD, tooth pain -has tried: Nothing -sick contacts/travel/risks: no reported flu, strep or tick exposure -Hx of: allergies and reports a remote history of pneumonia and wants to make sure she doesn't have pneumonia  ROS: See pertinent positives and negatives per HPI.  Past Medical History:  Diagnosis Date  . AICD (automatic cardioverter/defibrillator) present 10/08/2014   SUBQ    /    DR [name]  . CHF (congestive heart failure) ([place])   . Diabetes mellitus without complication ([place])   . History of cardiac cath 05/2007   normal-with patent coronaries  . History of colonoscopy   . History of hiatal hernia   . Hypertension   . Iatrogenic thyroiditis   . Non-ischemic cardiomyopathy ([place])    EF 28%- reassessment of LV function 2011 with LVEf 45-50%  . PAD (peripheral artery disease) ([facility])    lower extremities with ABIs of 0.5 bilaterally  . Personal history of goiter   . S/P radioactive iodine thyroid ablation   . S/P thyroidectomy   . Shortness of breath dyspnea     Past Surgical History:  Procedure Laterality Date  . EP IMPLANTABLE DEVICE N/A 10/08/2014   Procedure: SubQ ICD Implant;  Surgeon: [name], MD;  Location: [facility];  Service: Cardiovascular;  Laterality: N/A;  . THYROIDECTOMY    . TONSILLECTOMY    . TOTAL ABDOMINAL HYSTERECTOMY      Family History  Problem Relation Age of Onset  . Diabetes type II Mother   . Hypertension Mother   . Diabetes Mother   . Heart disease Mother   . Other Father        deceased from accident age 63  . Hyperlipidemia Father     Social History   Social History  . Marital status: Divorced    Spouse name: N/A  . Number of children: 1  . Years  of education: N/A   Occupational History  . Retired Retired    Education officer, museum   Social History Main Topics  . Smoking status: Current Some Day Smoker    Packs/day: 0.25    Years: 40.00    Types: Cigarettes  . Smokeless tobacco: Never Used     Comment: Currently smokes about 1 pack every 2 weeks.  (5/7/8)  . Alcohol use No  . Drug use: No  . Sexual activity: Not Currently   Other Topics Concern  . None   Social History Narrative   Retired Education officer, museum   Divorced - one grown son   current smoker    Alcohol use-no      Drug use-no    Regular exercise- no     son Elynn Patteson     Current Outpatient Prescriptions:  .  aspirin 81 MG tablet, Take 81 mg by mouth daily.  , Disp: , Rfl:  .  atorvastatin (LIPITOR) 40 MG tablet, Take 1.5 tablets (60 mg total) by mouth daily., Disp: 135 tablet, Rfl: 3 .  carvedilol (COREG) 25 MG tablet, Take 1 tablet (25 mg total) by mouth 2 (two) times daily with a meal., Disp: 180 tablet, Rfl: 3 .  dextromethorphan (DELSYM) 30 MG/5ML liquid, Take 2.5 mLs (15 mg total) by mouth 2 (two) times daily., Disp: 89 mL, Rfl: 0 .  furosemide (LASIX) 40 MG tablet, Take 1 tablet (40 mg total) by mouth 2 (two) times daily., Disp: 180 tablet, Rfl: 3 .  glucose blood (ONETOUCH VERIO) test strip, Use as instructed to check blood sugar once a day, Disp: 100 each, Rfl: 3 .  hydrALAZINE (APRESOLINE) 50 MG tablet, take 1 tablet by mouth three times a day, Disp: 270 tablet, Rfl: 1 .  isosorbide mononitrate (IMDUR) 30 MG 24 hr tablet, take 2 tablet by mouth once daily, Disp: 180 tablet, Rfl: 3 .  levothyroxine (SYNTHROID, LEVOTHROID) 137 MCG tablet, Take 1 tablet (137 mcg total) by mouth daily before breakfast., Disp: 90 tablet, Rfl: 1 .  metFORMIN (GLUCOPHAGE) 1000 MG tablet, Take 1 tablet (1,000 mg total) by mouth daily with breakfast., Disp: 90 tablet, Rfl: 1 .  ONETOUCH DELICA LANCETS 99J MISC, 1 Device by Other route daily., Disp: 100 each, Rfl: 3 .   sacubitril-valsartan (ENTRESTO) 49-51 MG, Take 1 tablet by mouth 2 (two) times daily. Overdue for follow up. Call and schedule for future refills [phone], Disp: 60 tablet, Rfl: 0 .  spironolactone (ALDACTONE) 50 MG tablet, Take 1 tablet (50 mg total) by mouth daily., Disp: 30 tablet, Rfl: 11 .  benzonatate (TESSALON PERLES) 100 MG capsule, Take 1 capsule (100 mg total) by mouth 3 (three) times daily as needed., Disp: 20 capsule, Rfl: 0 .  fluticasone (FLONASE) 50 MCG/ACT nasal spray, Place 2 sprays into both nostrils daily., Disp: 16 g, Rfl: 6  EXAM:  Vitals:   11/10/16 1353  BP: 110/80  Pulse: 76  Temp: 97.7 F (36.5 C)  SpO2: 96%    Body mass index is 32.34 kg/m.  GENERAL: vitals reviewed and listed above, alert, oriented, appears well hydrated and in no acute distress  HEENT: atraumatic, conjunttiva With mild injection bilaterally and clear drainage, no obvious abnormalities on inspection of external nose and ears, normal appearance of ear canals and TMs, clear nasal congestion, mild post oropharyngeal erythema with PND, no tonsillar edema or exudate, no sinus TTP  NECK: no obvious masses on inspection  LUNGS: clear to auscultation bilaterally, no wheezes, rales or rhonchi, good air movement  CV: HRRR, no peripheral edema  MS: moves all extremities without noticeable abnormality  PSYCH: pleasant and cooperative, no obvious depression or anxiety  ASSESSMENT AND PLAN:  Discussed the following assessment and plan:  Cough - Plan: DG Chest 2 View  Sinus congestion  Cigarette smoker  -given HPI and exam findings today, a serious infection or illness is unlikely. We discussed potential etiologies, with allergies versus VURI being most likely, possibly with mild COPD given her history of smoking. Opted to check a chest x-ray to exclude pneumonia. Otherwise will treat with Flonase and Tessalon for cough. We discussed treatment side effects, likely course, antibiotic misuse,  transmission, and signs of developing a serious illness. Congratulated her on quitting smoking and advised her to continu smoking cessation. She quit smoking 3 weeks ago.e -of course, we advised to return or notify a doctor immediately if symptoms worsen or persist or new concerns arise. She is due for follow-up for regular chronic disease management, she agrees to schedule this visit in one month.   Patient Instructions  BEFORE YOU LEAVE: -X-ray sheet -follow up: Follow up in 1 month  Go get the x-ray.  Start Flonase 2 sprays each nostril daily. Continue this for 4 weeks.  Use the Tessalon as needed for cough.  I hope you are feeling better soon! Seek care immediately if worsening,  new concerns or you are not improving with treatment.     Colin Benton R., DO

## 2016-11-10 NOTE — Patient Instructions (Signed)
BEFORE YOU LEAVE: -X-ray sheet -follow up: Follow up in 1 month  Go get the x-ray.  Start Flonase 2 sprays each nostril daily. Continue this for 4 weeks.  Use the Tessalon as needed for cough.  I hope you are feeling better soon! Seek care immediately if worsening, new concerns or you are not improving with treatment.

## 2016-11-15 ENCOUNTER — Encounter: Payer: Self-pay | Admitting: Family Medicine

## 2016-11-15 ENCOUNTER — Telehealth: Payer: Self-pay | Admitting: Family Medicine

## 2016-11-15 NOTE — Telephone Encounter (Signed)
Pt state that the medication is not working and would like to see if she could get an antibiotic so that she does not have to go through it for the weekend.  Pt state that she is doing a lot of coughing and blowing up yellow yucky stuff. Pharm:  [facility] [address]

## 2016-11-16 MED ORDER — AMOXICILLIN-POT CLAVULANATE 875-125 MG PO TABS: 1.0000 | ORAL_TABLET | Freq: Two times a day (BID) | ORAL | 0 refills | Status: DC

## 2016-11-16 NOTE — Telephone Encounter (Signed)
Please call her to let her know. Since > then 1 week, worsening and discolored mucus may have  Sinus infection. Will send abx. Do want her to follow up or seek care if worsening or not improving.

## 2016-11-16 NOTE — Telephone Encounter (Signed)
I left a detailed message with the information below at the pts cell number. 

## 2016-11-24 ENCOUNTER — Telehealth: Payer: Self-pay | Admitting: Internal Medicine

## 2016-11-24 NOTE — Telephone Encounter (Signed)
Spoke with the pt  She states she has had sinus infection x 3 wks- having cough, sinus pressure and fatigue  She states that she is not improving much after round of abx given by her PCP  I advised needs to be re-evaluated by PCP We have only seen her for sleep  Pt verbalized understanding

## 2016-11-27 ENCOUNTER — Ambulatory Visit: Payer: Medicare Other | Admitting: Family Medicine

## 2016-11-29 ENCOUNTER — Ambulatory Visit (INDEPENDENT_AMBULATORY_CARE_PROVIDER_SITE_OTHER): Payer: Medicare Other | Admitting: *Deleted

## 2016-11-29 DIAGNOSIS — I428 Other cardiomyopathies: Secondary | ICD-10-CM

## 2016-11-30 NOTE — Progress Notes (Signed)
Remote ICD transmission.   

## 2016-12-01 LAB — CUP PACEART REMOTE DEVICE CHECK
Implantable Lead Implant Date: 20160707
Implantable Pulse Generator Implant Date: 20160707
MDC IDC LEAD LOCATION: 753858
MDC IDC MSMT BATTERY REMAINING PERCENTAGE: 76 %
MDC IDC PG SERIAL: 115852
MDC IDC SESS DTM: 20180829112600

## 2016-12-05 ENCOUNTER — Ambulatory Visit: Payer: Medicare Other

## 2016-12-10 NOTE — Progress Notes (Signed)
HPI:  Acute visit for: Due for flu vaccine AWV 08/13/16  Elevated BP/HTN: -PMH CsCHF w/ AICD, cardiomyopathy, HLD, PAD -sees cardiologist - on many meds - did not take this morning -cards following/managing cholesterol -no CP, SOB, DOE - reports has follow up with her cardiologist later this month  Smoking: -down to 2 cigs per day! -interested in quitting -smoke when around friends that smoke  Cyst on face: -desires removal -reports has had for a long time, but she thinks is getting bigger  DM: -meds: metformin  -only taking once daily   -has neuropathy in feet, sees podiatrist -does eye exam yearly - sees a doctor at [facility]  Hypothyroidism: -meds: synthroid  Cough, sinus congestion: -started about 3-4 weeks ago -treated with Augementin x10 days about 1.5 weeks ago and allergy regimen -reports:doing well now with resolution symptoms -denies: SOB, CP, sinus pain  ROS: See pertinent positives and negatives per HPI.  Past Medical History:  Diagnosis Date  . AICD (automatic cardioverter/defibrillator) present 10/08/2014   SUBQ    /    DR [name]  . CHF (congestive heart failure) ([place])   . Diabetes mellitus without complication ([place])   . History of cardiac cath 05/2007   normal-with patent coronaries  . History of colonoscopy   . History of hiatal hernia   . Hypertension   . Iatrogenic thyroiditis   . Non-ischemic cardiomyopathy ([place])    EF 28%- reassessment of LV function 2011 with LVEf 45-50%  . PAD (peripheral artery disease) ([facility])    lower extremities with ABIs of 0.5 bilaterally  . Personal history of goiter   . S/P radioactive iodine thyroid ablation   . S/P thyroidectomy   . Shortness of breath dyspnea     Past Surgical History:  Procedure Laterality Date  . EP IMPLANTABLE DEVICE N/A 10/08/2014   Procedure: SubQ ICD Implant;  Surgeon: [name], MD;  Location: [facility];  Service: Cardiovascular;  Laterality: N/A;  . THYROIDECTOMY    .  TONSILLECTOMY    . TOTAL ABDOMINAL HYSTERECTOMY      Family History  Problem Relation Age of Onset  . Diabetes type II Mother   . Hypertension Mother   . Diabetes Mother   . Heart disease Mother   . Other Father        deceased from accident age 27  . Hyperlipidemia Father     Social History   Social History  . Marital status: Divorced    Spouse name: N/A  . Number of children: 1  . Years of education: N/A   Occupational History  . Retired Retired    Education officer, museum   Social History Main Topics  . Smoking status: Current Some Day Smoker    Packs/day: 0.25    Years: 40.00    Types: Cigarettes  . Smokeless tobacco: Never Used     Comment: Currently smokes about 1 pack every 2 weeks.  (5/7/8)  . Alcohol use No  . Drug use: No  . Sexual activity: Not Currently   Other Topics Concern  . None   Social History Narrative   Retired Education officer, museum   Divorced - one grown son   current smoker    Alcohol use-no      Drug use-no    Regular exercise- no     son Gilberta Peeters     Current Outpatient Prescriptions:  .  aspirin 81 MG tablet, Take 81 mg by mouth daily.  , Disp: ,  Rfl:  .  atorvastatin (LIPITOR) 40 MG tablet, Take 1.5 tablets (60 mg total) by mouth daily., Disp: 135 tablet, Rfl: 3 .  carvedilol (COREG) 25 MG tablet, Take 1 tablet (25 mg total) by mouth 2 (two) times daily with a meal., Disp: 180 tablet, Rfl: 3 .  furosemide (LASIX) 40 MG tablet, Take 1 tablet (40 mg total) by mouth 2 (two) times daily., Disp: 180 tablet, Rfl: 3 .  glucose blood (ONETOUCH VERIO) test strip, Use as instructed to check blood sugar once a day, Disp: 100 each, Rfl: 3 .  hydrALAZINE (APRESOLINE) 50 MG tablet, take 1 tablet by mouth three times a day, Disp: 270 tablet, Rfl: 1 .  isosorbide mononitrate (IMDUR) 30 MG 24 hr tablet, take 2 tablet by mouth once daily, Disp: 180 tablet, Rfl: 3 .  levothyroxine (SYNTHROID, LEVOTHROID) 137 MCG tablet, Take 1 tablet (137 mcg total) by mouth  daily before breakfast., Disp: 90 tablet, Rfl: 1 .  metFORMIN (GLUCOPHAGE) 1000 MG tablet, Take 1 tablet (1,000 mg total) by mouth daily with breakfast., Disp: 90 tablet, Rfl: 1 .  ONETOUCH DELICA LANCETS 62B MISC, 1 Device by Other route daily., Disp: 100 each, Rfl: 3 .  sacubitril-valsartan (ENTRESTO) 49-51 MG, Take 1 tablet by mouth 2 (two) times daily. Overdue for follow up. Call and schedule for future refills [phone], Disp: 60 tablet, Rfl: 0 .  spironolactone (ALDACTONE) 50 MG tablet, Take 1 tablet (50 mg total) by mouth daily., Disp: 30 tablet, Rfl: 11  EXAM:  Vitals:   12/11/16 0905  BP: 140/82  Pulse: 86  Temp: 98.1 F (36.7 C)  SpO2: 98%    Body mass index is 32.77 kg/m.  GENERAL: vitals reviewed and listed above, alert, oriented, appears well hydrated and in no acute distress  HEENT: atraumatic, conjunttiva clear, no obvious abnormalities on inspection of external nose and ears  NECK: no obvious masses on inspection  LUNGS: clear to auscultation bilaterally, no wheezes, rales or rhonchi, good air movement  CV: HRRR, no peripheral edema  SKIN: soft dome shaped flesh colored lesion R cheek ~ 1cm in diam  MS: moves all extremities without noticeable abnormality  PSYCH: pleasant and cooperative, no obvious depression or anxiety  ASSESSMENT AND PLAN:  Discussed the following assessment and plan:  Type 2 diabetes mellitus with diabetic polyneuropathy, without long-term current use of insulin (HCC)  Hypertension associated with diabetes ([place]) - Plan: Basic metabolic panel, CBC  Hyperlipidemia associated with type 2 diabetes mellitus (HCC)  Hypothyroidism, unspecified type - Plan: TSH  Cigarette smoker  Nonischemic cardiomyopathy ([place])  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  Patient Instructions  BEFORE YOU LEAVE: -flu shot -follow up: 3-4 months -labs  Take all blood pressure medications daily - take cuff and log to follow up with cardiologist later this month.  Congratulations on cutting back on smoking! Quit smoking all the way! Lozenges for cravings.  Call  the dermatology office today to set up appointment.  We have ordered labs or studies at this visit. It can take up to 1-2 weeks for results and processing. IF results require follow up or explanation, we will call you with instructions. Clinically stable results will be released to your [facility]. If you have not heard from [place] or cannot find your results in [facility] in 2 weeks please contact our office at [phone].  If you are not yet signed up for [facility] please consider signing up.   We recommend the following healthy lifestyle for LIFE: 1) Small portions.   Tip: eat off of a salad plate instead of a dinner plate.  Tip: It is ok to feel hungry after a meal - that likely means you ate an appropriate portion.  Tip: if you need more or a snack choose fruits, veggies and/or a handful of nuts or seeds.  2) Eat a healthy clean diet.   TRY TO EAT: -at least 5-7 servings of low sugar vegetables per day (not corn, potatoes or bananas.) -berries are the best choice if you wish to eat fruit.   -lean meets (fish, chicken or [place] breasts) -vegan proteins for some meals - beans or tofu, whole grains, nuts and seeds -Replace bad fats with good fats - good fats include: fish, nuts and seeds, canola  oil, olive oil -small amounts of low fat or non fat dairy -small amounts of100 % whole grains - check the lables  AVOID: -SUGAR, sweets, anything with added sugar, corn syrup or sweeteners -if you must have a sweetener, small amounts of stevia may be best -sweetened beverages -simple starches (rice, bread, potatoes, pasta, chips, etc - small amounts of 100% whole grains are ok) -red meat, pork, butter -fried foods, fast food, processed food, excessive dairy, eggs and coconut.  3)Get at least 150 minutes of sweaty aerobic exercise per week.  4)Reduce stress - consider counseling, meditation and relaxation to balance other aspects of your life.   WE NOW OFFER   [facility] FAST TRACK!!!  SAME DAY Appointments for ACUTE CARE  Such as: Sprains, Injuries, cuts, abrasions, rashes, muscle pain, joint pain, back pain Colds, flu, sore throats, headache, allergies, cough, fever  Ear pain, sinus and eye infections Abdominal pain, nausea, vomiting, diarrhea, upset stomach Animal/insect bites  3 Easy Ways to Schedule: Walk-In Scheduling Call in scheduling Mychart Sign-up: [email or web address]               Colin Benton R., DO

## 2016-12-11 ENCOUNTER — Encounter: Payer: Self-pay | Admitting: Family Medicine

## 2016-12-11 ENCOUNTER — Ambulatory Visit (INDEPENDENT_AMBULATORY_CARE_PROVIDER_SITE_OTHER): Payer: Medicare Other | Admitting: Family Medicine

## 2016-12-11 VITALS — BP 140/82 | HR 86 | Temp 98.1°F | Ht 70.0 in | Wt 228.4 lb

## 2016-12-11 DIAGNOSIS — I10 Essential (primary) hypertension: Secondary | ICD-10-CM | POA: Diagnosis not present

## 2016-12-11 DIAGNOSIS — Z23 Encounter for immunization: Secondary | ICD-10-CM | POA: Diagnosis not present

## 2016-12-11 DIAGNOSIS — E1159 Type 2 diabetes mellitus with other circulatory complications: Secondary | ICD-10-CM | POA: Diagnosis not present

## 2016-12-11 DIAGNOSIS — E1142 Type 2 diabetes mellitus with diabetic polyneuropathy: Secondary | ICD-10-CM

## 2016-12-11 DIAGNOSIS — I70219 Atherosclerosis of native arteries of extremities with intermittent claudication, unspecified extremity: Secondary | ICD-10-CM

## 2016-12-11 DIAGNOSIS — E785 Hyperlipidemia, unspecified: Secondary | ICD-10-CM | POA: Diagnosis not present

## 2016-12-11 DIAGNOSIS — E1169 Type 2 diabetes mellitus with other specified complication: Secondary | ICD-10-CM

## 2016-12-11 DIAGNOSIS — F17210 Nicotine dependence, cigarettes, uncomplicated: Secondary | ICD-10-CM | POA: Diagnosis not present

## 2016-12-11 DIAGNOSIS — I428 Other cardiomyopathies: Secondary | ICD-10-CM | POA: Diagnosis not present

## 2016-12-11 DIAGNOSIS — E039 Hypothyroidism, unspecified: Secondary | ICD-10-CM | POA: Diagnosis not present

## 2016-12-11 LAB — BASIC METABOLIC PANEL
BUN: 15 mg/dL (ref 6–23)
CO2: 25 mEq/L (ref 19–32)
CREATININE: 1.22 mg/dL — AB (ref 0.40–1.20)
Calcium: 9.5 mg/dL (ref 8.4–10.5)
Chloride: 104 mEq/L (ref 96–112)
GFR: 55.51 mL/min — ABNORMAL LOW (ref >60.00)
Glucose, Bld: 128 mg/dL — ABNORMAL HIGH (ref 70–99)
POTASSIUM: 4.4 meq/L (ref 3.5–5.1)
Sodium: 139 mEq/L (ref 135–145)

## 2016-12-11 LAB — CBC
HCT: 42.8 % (ref 36.0–46.0)
Hemoglobin: 13.6 g/dL (ref 12.0–15.0)
MCHC: 31.9 g/dL (ref 30.0–36.0)
MCV: 98 fl (ref 78.0–100.0)
Platelets: 230 10*3/uL (ref 150.0–400.0)
RBC: 4.37 Mil/uL (ref 3.87–5.11)
RDW: 15 % (ref 11.5–15.5)
WBC: 6.1 10*3/uL (ref 4.0–10.5)

## 2016-12-11 LAB — TSH: TSH: 39.32 u[IU]/mL — AB (ref 0.35–4.50)

## 2016-12-11 NOTE — Patient Instructions (Signed)
BEFORE YOU LEAVE: -flu shot -follow up: 3-4 months -labs  Take all blood pressure medications daily - take cuff and log to follow up with cardiologist later this month.  Congratulations on cutting back on smoking! Quit smoking all the way! Lozenges for cravings.  Call the dermatology office today to set up appointment.  We have ordered labs or studies at this visit. It can take up to 1-2 weeks for results and processing. IF results require follow up or explanation, we will call you with instructions. Clinically stable results will be released to your [facility]. If you have not heard from [place] or cannot find your results in [facility] in 2 weeks please contact our office at [phone].  If you are not yet signed up for [facility], please consider signing up.   We recommend the following healthy lifestyle for LIFE: 1) Small portions.   Tip: eat off of a salad plate instead of a dinner plate.  Tip: It is ok to feel hungry after a meal - that likely means you ate an appropriate portion.  Tip: if you need more or a snack choose fruits, veggies and/or a handful of nuts or seeds.  2) Eat a healthy clean diet.   TRY TO EAT: -at least 5-7 servings of low sugar vegetables per day (not corn, potatoes or bananas.) -berries are the best choice if you wish to eat fruit.   -lean meets (fish, chicken or [place] breasts) -vegan proteins for some meals - beans or tofu, whole grains, nuts and seeds -Replace bad fats with good fats - good fats include: fish, nuts and seeds, canola oil, olive oil -small amounts of low fat or non fat dairy -small amounts of100 % whole grains - check the lables  AVOID: -SUGAR, sweets, anything with added sugar, corn syrup or sweeteners -if you must have a sweetener, small amounts of stevia may be best -sweetened beverages -simple starches (rice, bread, potatoes, pasta, chips, etc - small amounts of 100% whole grains are ok) -red meat, pork, butter -fried foods, fast food,  processed food, excessive dairy, eggs and coconut.  3)Get at least 150 minutes of sweaty aerobic exercise per week.  4)Reduce stress - consider counseling, meditation and relaxation to balance other aspects of your life.   WE NOW OFFER   [facility] FAST TRACK!!!  SAME DAY Appointments for ACUTE CARE  Such as: Sprains, Injuries, cuts, abrasions, rashes, muscle pain, joint pain, back pain Colds, flu, sore throats, headache, allergies, cough, fever  Ear pain, sinus and eye infections Abdominal pain, nausea, vomiting, diarrhea, upset stomach Animal/insect bites  3 Easy Ways to Schedule: Walk-In Scheduling Call in scheduling Mychart Sign-up: [email or web address]

## 2016-12-11 NOTE — Addendum Note (Signed)
Addended by: Agnes Lawrence on: 12/11/2016 09:44 AM   Modules accepted: Orders

## 2016-12-12 ENCOUNTER — Encounter: Payer: Self-pay | Admitting: Cardiology

## 2016-12-12 MED ORDER — LEVOTHYROXINE SODIUM 150 MCG PO TABS: 150.0000 ug | ORAL_TABLET | Freq: Every day | ORAL | 4 refills | Status: DC

## 2016-12-12 NOTE — Addendum Note (Signed)
Addended by: Agnes Lawrence on: 12/12/2016 09:09 AM   Modules accepted: Orders

## 2016-12-18 ENCOUNTER — Ambulatory Visit (INDEPENDENT_AMBULATORY_CARE_PROVIDER_SITE_OTHER): Payer: Medicare Other | Admitting: Internal Medicine

## 2016-12-18 ENCOUNTER — Encounter: Payer: Self-pay | Admitting: Internal Medicine

## 2016-12-18 VITALS — BP 152/78 | HR 93 | Ht 70.0 in | Wt 226.2 lb

## 2016-12-18 DIAGNOSIS — L910 Hypertrophic scar: Secondary | ICD-10-CM | POA: Diagnosis not present

## 2016-12-18 DIAGNOSIS — I5022 Chronic systolic (congestive) heart failure: Secondary | ICD-10-CM | POA: Diagnosis not present

## 2016-12-18 DIAGNOSIS — I428 Other cardiomyopathies: Secondary | ICD-10-CM

## 2016-12-18 DIAGNOSIS — I10 Essential (primary) hypertension: Secondary | ICD-10-CM | POA: Diagnosis not present

## 2016-12-18 DIAGNOSIS — Z95810 Presence of automatic (implantable) cardiac defibrillator: Secondary | ICD-10-CM | POA: Diagnosis not present

## 2016-12-18 LAB — CUP PACEART INCLINIC DEVICE CHECK
Implantable Pulse Generator Implant Date: 20160707
MDC IDC LEAD IMPLANT DT: 20160707
MDC IDC LEAD LOCATION: 753858
MDC IDC PG SERIAL: 115852
MDC IDC SESS DTM: 20180917153312

## 2016-12-18 MED ORDER — CARVEDILOL 25 MG PO TABS: 25.0000 mg | ORAL_TABLET | Freq: Two times a day (BID) | ORAL | 3 refills | Status: DC

## 2016-12-18 NOTE — Patient Instructions (Addendum)
Medication Instructions: - Your physician recommends that you continue on your current medications as directed. Please refer to the Current Medication list given to you today.  Labwork: - none ordered  Procedures/Testing: - none ordered  Follow-Up: - You have been referred to : Dr. Harlow Mares (plastic surgeon) regarding your keloid  - Remote monitoring is used to monitor your Pacemaker of ICD from home. This monitoring reduces the number of office visits required to check your device to one time per year. It allows [place] to keep an eye on the functioning of your device to ensure it is working properly. You are scheduled for a device check from home on 02/28/17. You may send your transmission at any time that day. If you have a wireless device, the transmission will be sent automatically. After your physician reviews your transmission, you will receive a postcard with your next transmission date.  - Your physician wants you to follow-up in: 1 year with Dr. [name]. You will receive a reminder letter in the mail two months in advance. If you don't receive a letter, please call our office to schedule the follow-up appointment.   Any Additional Special Instructions Will Be Listed Below (If Applicable).     If you need a refill on your cardiac medications before your next appointment, please call your pharmacy.

## 2016-12-18 NOTE — Progress Notes (Signed)
Patient Care Team: Lucretia Kern, DO as PCP - General (Family Medicine)   HPI  Paula Stein is a 71 y.o. 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 At that point she recently been started on ARB and is on Aldactone. We added nitrates to her hydralazine.  DATE TEST    4/16    Echo   EF 30-35 % Severe LVH/LAE         Date Cr K TSH  12/17 1.02 4.4 6.34  9/18 1.22 4.4 39.32   She has been gaining weight.  She denies SOB, edema or CP, apart from pain at ICD site--related to the keloid  Her TSH As above; synthroid was just increased     Past Medical History:  Diagnosis Date  . AICD (automatic cardioverter/defibrillator) present 10/08/2014   SUBQ    /    DR [name]  . CHF (congestive heart failure) ([place])   . Diabetes mellitus without complication ([place])   . History of cardiac cath 05/2007   normal-with patent coronaries  . History of colonoscopy   . History of hiatal hernia   . Hypertension   . Iatrogenic thyroiditis   . Non-ischemic cardiomyopathy ([place])    EF 28%- reassessment of LV function 2011 with LVEf 45-50%  . PAD (peripheral artery disease) (HCC)    lower extremities with ABIs of 0.5 bilaterally  . Personal history of goiter   . S/P radioactive iodine thyroid ablation   . S/P thyroidectomy   . Shortness of breath dyspnea     Past Surgical History:  Procedure Laterality Date  . EP IMPLANTABLE DEVICE N/A 10/08/2014   Procedure: SubQ ICD Implant;  Surgeon: [name], MD;  Location: [facility];  Service: Cardiovascular;  Laterality: N/A;  . THYROIDECTOMY    . TONSILLECTOMY    . TOTAL ABDOMINAL HYSTERECTOMY      Current Outpatient Prescriptions  Medication Sig Dispense Refill  . aspirin 81 MG tablet Take 81 mg by mouth daily.      Marland Kitchen atorvastatin (LIPITOR) 40 MG tablet Take 1.5 tablets (60 mg total)  by mouth daily. 135 tablet 3  . carvedilol (COREG) 25 MG tablet Take 1 tablet (25 mg total) by mouth 2 (two) times daily with a meal. 180 tablet 3  . furosemide (LASIX) 40 MG tablet Take 1 tablet (40 mg total) by mouth 2 (two) times daily. 180 tablet 3  . glucose blood (ONETOUCH VERIO) test strip Use as instructed to check blood sugar once a day 100 each 3  . hydrALAZINE (APRESOLINE) 50 MG tablet take 1 tablet by mouth three times a day 270 tablet 1  . isosorbide mononitrate (IMDUR) 30 MG 24 hr tablet take 2 tablet by mouth once daily 180 tablet 3  . levothyroxine (SYNTHROID, LEVOTHROID) 150 MCG tablet Take 1 tablet (150 mcg total) by mouth daily. 30 tablet 4  . metFORMIN (GLUCOPHAGE) 1000 MG tablet Take 1,000 mg by mouth every other day.    Glory Rosebush DELICA LANCETS 31D MISC 1 Device by Other route daily. 100 each 3  . sacubitril-valsartan (ENTRESTO) 49-51 MG Take 1 tablet by mouth 2 (two) times daily. Overdue for follow up. Call and schedule for future refills [phone] 60 tablet 0  . spironolactone (ALDACTONE) 50 MG tablet Take 1 tablet (50 mg total) by mouth daily. 30 tablet  11   No current facility-administered medications for this visit.     Allergies  Allergen Reactions  . Ibuprofen Nausea Only      Review of Systems negative except from HPI and PMH  Physical Exam BP (!) 152/78   Pulse 93   Ht 5\' 10"  (1.778 m)   Wt 226 lb 3.2 oz (102.6 kg)   SpO2 96%   BMI 32.46 kg/m  Well developed and nourished in no acute distress HENT normal Neck supple with JVP-flat Carotids brisk and full without bruits Clear Device pocket well healed; without hematoma or erythema.  There is no tethering there is a painful keloid Regular rate and rhythm, no murmurs or gallops Abd-soft with active BS without hepatomegaly No Clubbing cyanosis edema Skin-warm and dry A & Oriented  Grossly normal sensory and motor function  ECG sinus 93 21/09/36   Assessment and  Plan  Nonischemic  cardiomyopathy  CHF  Chronic systolic  ICD-Subcutaneous The patient's device was interrogated.  The information was reviewed. No changes were made in the programming.     Keloid  Hypertension  Hypothyroid  High Risk Medication Surveillance  Drug labs are reviewed and normal   Euvolemic continue current meds  Euvolemic continue current meds  Will refer to Dr Marica Otter for Rx of keloid  BP is elevated, but would wait until euthyroid before making med adjustments

## 2016-12-21 ENCOUNTER — Encounter: Payer: Self-pay | Admitting: Internal Medicine

## 2016-12-21 ENCOUNTER — Encounter: Payer: Self-pay | Admitting: Family Medicine

## 2016-12-22 ENCOUNTER — Telehealth: Payer: Self-pay | Admitting: Internal Medicine

## 2016-12-22 NOTE — Telephone Encounter (Signed)
Spoke with Vivien Rota with [employer], who states a CMN for cpap supplies was faxed over for MW's signature.  I have spoken with Rodena Piety and looked in MW look out, it does not appear that CMN has been received. I requested for CMN to be faxed to our office.

## 2016-12-25 NOTE — Telephone Encounter (Signed)
This should be signed by VS, it is his sleep patient and he ordered CPAP  Form received and placed in his lookat cubby

## 2016-12-27 NOTE — Telephone Encounter (Signed)
JJ - do you know if this form has been handled? Thanks!

## 2016-12-29 NOTE — Telephone Encounter (Signed)
Spoke with Rodena Piety in [facility] this morning, she has rec'd the CMN yesterday at it was addressed to MW, not to [place].

## 2016-12-29 NOTE — Telephone Encounter (Signed)
Looked at VS folders this morning, do not see the form in his folders or cubby at this time. Looking further into this situation this morning.

## 2017-01-02 ENCOUNTER — Telehealth: Payer: Self-pay | Admitting: Internal Medicine

## 2017-01-02 NOTE — Telephone Encounter (Signed)
Called [name] but they are closed. Will attempt to call in the morning.

## 2017-01-03 NOTE — Telephone Encounter (Signed)
Called '[facility] [place]' and spoke with Vivien Rota who wanted to verify the physician's name >> Dr Christinia Gully.  NPI verified with Vivien Rota  Nothing further needed; will sign off

## 2017-01-14 ENCOUNTER — Other Ambulatory Visit: Payer: Self-pay | Admitting: Internal Medicine

## 2017-01-24 ENCOUNTER — Other Ambulatory Visit: Payer: Medicare Other

## 2017-01-25 ENCOUNTER — Ambulatory Visit: Payer: Medicare Other | Admitting: Cardiovascular Disease

## 2017-01-25 ENCOUNTER — Other Ambulatory Visit: Payer: Medicare Other

## 2017-01-29 ENCOUNTER — Encounter: Payer: Medicare Other | Admitting: Internal Medicine

## 2017-02-01 ENCOUNTER — Other Ambulatory Visit (INDEPENDENT_AMBULATORY_CARE_PROVIDER_SITE_OTHER): Payer: Medicare Other

## 2017-02-01 DIAGNOSIS — E039 Hypothyroidism, unspecified: Secondary | ICD-10-CM

## 2017-02-01 LAB — TSH: TSH: 20.66 u[IU]/mL — AB (ref 0.35–4.50)

## 2017-02-02 MED ORDER — LEVOTHYROXINE SODIUM 175 MCG PO TABS
175.0000 ug | ORAL_TABLET | Freq: Every day | ORAL | 1 refills | Status: DC
Start: 2017-02-02 — End: 2017-09-06

## 2017-02-28 ENCOUNTER — Ambulatory Visit (INDEPENDENT_AMBULATORY_CARE_PROVIDER_SITE_OTHER): Payer: Medicare Other | Admitting: *Deleted

## 2017-02-28 DIAGNOSIS — I428 Other cardiomyopathies: Secondary | ICD-10-CM | POA: Diagnosis not present

## 2017-02-28 NOTE — Progress Notes (Signed)
Remote ICD transmission.   

## 2017-03-01 ENCOUNTER — Encounter: Payer: Self-pay | Admitting: Family Medicine

## 2017-03-02 ENCOUNTER — Encounter: Payer: Self-pay | Admitting: Cardiology

## 2017-03-06 NOTE — Telephone Encounter (Signed)
Paula Stein w/ [employer] called in to check the status of paperwork, she said that form was refaxed on 03/01/17.      Please assist further.    Fax: [phone]  Phone: [phone]

## 2017-03-13 ENCOUNTER — Telehealth: Payer: Self-pay | Admitting: Family Medicine

## 2017-03-13 LAB — CUP PACEART REMOTE DEVICE CHECK
Date Time Interrogation Session: 20181128120200
MDC IDC LEAD IMPLANT DT: 20160707
MDC IDC LEAD LOCATION: 753858
MDC IDC MSMT BATTERY REMAINING PERCENTAGE: 73 %
MDC IDC PG IMPLANT DT: 20160707
MDC IDC PG SERIAL: 115852

## 2017-03-13 NOTE — Telephone Encounter (Signed)
Copied from [place] [id number]. Topic: Quick Communication - See Telephone Encounter >> Mar 13, 2017  9:10 AM Boyd Kerbs wrote: CRM for notification. See Telephone encounter for:  [facility] [employer] - stated faxed order for diebetic shoes for doctor signature since 11/12,  Dr. Hazel Sams to sign form  Fax # [phone]  03/13/17.

## 2017-04-04 NOTE — Telephone Encounter (Signed)
Spoke with pt and she states she already has the diabetic shoes

## 2017-04-11 NOTE — Progress Notes (Signed)
HPI:  Paula Stein is a pleasant 72 y.o. here for follow up. Chronic medical problems summarized below were reviewed for changes and stability and were updated as needed below. These issues and their treatment remain stable for the most part. Doing well. Eating healthier - no sugar and no red meat and lost some weight. Down to 0-4 cigarettes per day. Only took one of her BP meds this morning because did not eat. Denies CP, SOB, DOE, treatment intolerance or new symptoms. She plans to call her cardiologist to follow up soon as wants to use a different CPAP company. Due for labs, colon ca screening 4/19 (stool cards)  AWV 08/13/16  DM: -meds: metformin -only taking once daily - does not tolerate more -has neuropathy in feet, sees podiatrist -does eye exam yearly - [facility] a doctor [facility]  Hypothyroidism: -meds: synthroid  Smoking: -down to 0-4 per day, quit day her birthday -interested in quitting -smokes when around friends that smoke  HTN/CsCHF w/ AICD, cardiomyopathy, HLD, PAD, OSA: -sees cardiologist - on many meds -cards following/managing cholesterol  ROS: See pertinent positives and negatives per HPI.  Past Medical History:  Diagnosis Date  . AICD (automatic cardioverter/defibrillator) present 10/08/2014   SUBQ    /    DR [name]  . CHF (congestive heart failure) ([place])   . Diabetes mellitus without complication ([place])   . History of cardiac cath 05/2007   normal-with patent coronaries  . History of colonoscopy   . History of hiatal hernia   . Hypertension   . Iatrogenic thyroiditis   . Non-ischemic cardiomyopathy ([place])    EF 28%- reassessment of LV function 2011 with LVEf 45-50%  . PAD (peripheral artery disease) (HCC)    lower extremities with ABIs of 0.5 bilaterally  . Personal history of goiter   . S/P radioactive iodine thyroid ablation   . S/P thyroidectomy   . Shortness of breath dyspnea     Past Surgical History:  Procedure Laterality Date  . EP  IMPLANTABLE DEVICE N/A 10/08/2014   Procedure: SubQ ICD Implant;  Surgeon: [name], MD;  Location: [facility];  Service: Cardiovascular;  Laterality: N/A;  . THYROIDECTOMY    . TONSILLECTOMY    . TOTAL ABDOMINAL HYSTERECTOMY      Family History  Problem Relation Age of Onset  . Diabetes type II Mother   . Hypertension Mother   . Diabetes Mother   . Heart disease Mother   . Other Father        deceased from accident age 12  . Hyperlipidemia Father     Social History   Socioeconomic History  . Marital status: Divorced    Spouse name: None  . Number of children: 1  . Years of education: None  . Highest education level: None  Social Needs  . Financial resource strain: None  . Food insecurity - worry: None  . Food insecurity - inability: None  . Transportation needs - medical: None  . Transportation needs - non-medical: None  Occupational History  . Occupation: Retired    Fish farm manager: RETIRED    CommentAdvertising copywriter  Tobacco Use  . Smoking status: Current Some Day Smoker    Packs/day: 0.25    Years: 40.00    Pack years: 10.00    Types: Cigarettes  . Smokeless tobacco: Never Used  . Tobacco comment: Currently smokes about 1 pack every 2 weeks.  (5/7/8)  Substance and Sexual Activity  . Alcohol use:  No    Alcohol/week: 0.0 oz  . Drug use: No  . Sexual activity: Not Currently  Other Topics Concern  . None  Social History Narrative   Retired Education officer, museum   Divorced - one grown son   current smoker    Alcohol use-no      Drug use-no    Regular exercise- no     son [name]     Current Outpatient Medications:  .  aspirin 81 MG tablet, Take 81 mg by mouth daily.  , Disp: , Rfl:  .  atorvastatin (LIPITOR) 40 MG tablet, Take 1.5 tablets (60 mg total) by mouth daily., Disp: 135 tablet, Rfl: 3 .  carvedilol (COREG) 25 MG tablet, Take 1 tablet (25 mg total) by mouth 2 (two) times daily with a meal., Disp: 180 tablet, Rfl: 3 .  furosemide (LASIX)  40 MG tablet, Take 1 tablet (40 mg total) by mouth 2 (two) times daily., Disp: 180 tablet, Rfl: 3 .  glucose blood (ONETOUCH VERIO) test strip, Use as instructed to check blood sugar once a day, Disp: 100 each, Rfl: 3 .  hydrALAZINE (APRESOLINE) 50 MG tablet, take 1 tablet by mouth three times a day, Disp: 270 tablet, Rfl: 1 .  isosorbide mononitrate (IMDUR) 30 MG 24 hr tablet, take 2 tablet by mouth once daily, Disp: 180 tablet, Rfl: 3 .  levothyroxine (SYNTHROID) 175 MCG tablet, Take 1 tablet (175 mcg total) by mouth daily before breakfast., Disp: 90 tablet, Rfl: 1 .  levothyroxine (SYNTHROID, LEVOTHROID) 150 MCG tablet, Take 1 tablet (150 mcg total) by mouth daily., Disp: 30 tablet, Rfl: 4 .  metFORMIN (GLUCOPHAGE) 1000 MG tablet, Take 1,000 mg by mouth every other day., Disp: , Rfl:  .  ONETOUCH DELICA LANCETS 94W MISC, 1 Device by Other route daily., Disp: 100 each, Rfl: 3 .  sacubitril-valsartan (ENTRESTO) 49-51 MG, Take 1 tablet by mouth 2 (two) times daily., Disp: 60 tablet, Rfl: 10 .  spironolactone (ALDACTONE) 50 MG tablet, Take 1 tablet (50 mg total) by mouth daily., Disp: 30 tablet, Rfl: 11  EXAM:  Vitals:   04/13/17 0852  BP: 122/78  Pulse: 98  Temp: (!) 97.5 F (36.4 C)    Body mass index is 30.86 kg/m.  GENERAL: vitals reviewed and listed above, alert, oriented, appears well hydrated and in no acute distress  HEENT: atraumatic, conjunttiva clear, no obvious abnormalities on inspection of external nose and ears  NECK: no obvious masses on inspection  LUNGS: clear to auscultation bilaterally, no wheezes, rales or rhonchi, good air movement  CV: HRRR, no peripheral edema  MS: moves all extremities without noticeable abnormality  PSYCH: pleasant and cooperative, no obvious depression or anxiety  ASSESSMENT AND PLAN:  Discussed the following assessment and plan:  Type 2 diabetes mellitus with diabetic neuropathy, without long-term current use of insulin (HCC) -  Plan: Hemoglobin A1c -labs -may add med if not at goal and discussed options/risks - may do glipizide or [place]  Hyperlipidemia associated with type 2 diabetes mellitus ([place]) - Plan: Lipid panel  Hypertension associated with diabetes ([place]) - Plan: Basic metabolic panel, CBC -better on recheck, did not take med as fasting - likely goo on meds -quit smoking  Treatment-emergent central sleep apnea NICM (nonischemic cardiomyopathy) ([place]) 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  Patient Instructions  BEFORE YOU LEAVE: -labs -follow up: AWV with Manuela Schwartz and follow up with Dr. Maudie Mercury in May (after 5/13)  I am so so happy that you are eating healthier and giving up the cigarettes!  We have ordered labs or studies at this visit. It can take up to 1-2 weeks for results and processing. IF results require follow up or explanation, we will call you with instructions. Clinically stable results will be released to your [facility]. If you have not heard from [place] or cannot find your results in [facility] in 2 weeks please contact our office at [phone].  If you are not yet signed up for [facility], please consider signing up.   We recommend the following healthy lifestyle for LIFE: 1) Small portions. But, make sure to get regular (at least 3 per day), healthy meals and small healthy snacks if needed.  2) Eat a healthy clean diet.   TRY TO EAT: -at least 5-7 servings of low sugar, colorful, and  nutrient rich vegetables per day (not corn, potatoes or bananas.) -berries are the best choice if you wish to eat fruit (only eat small amounts if trying to reduce weight)  -lean meets (fish, white meat of chicken or [place]) -vegan proteins for some meals - beans or tofu, whole grains, nuts and seeds -Replace bad fats with good fats - good fats include: fish, nuts and seeds, canola oil, olive oil -small amounts of low fat or non fat dairy -small amounts of100 % whole grains - check the lables -drink plenty of water  AVOID: -SUGAR, sweets, anything with added sugar, corn syrup or sweeteners - must read labels as even foods advertised as "healthy" often are loaded with sugar -if you must have a sweetener, small amounts of stevia may be best -sweetened beverages and artificially sweetened beverages -simple starches (rice, bread, potatoes, pasta, chips, etc - small amounts of 100% whole grains are ok) -red meat, pork, butter -fried foods, fast food, processed food, excessive dairy, eggs and coconut.  3)Get at least 150 minutes of sweaty aerobic exercise per week.  4)Reduce stress - consider counseling, meditation and relaxation to balance other aspects of your life.          Colin Benton R., DO

## 2017-04-12 ENCOUNTER — Encounter: Payer: Self-pay | Admitting: Family Medicine

## 2017-04-13 ENCOUNTER — Encounter: Payer: Self-pay | Admitting: Family Medicine

## 2017-04-13 ENCOUNTER — Ambulatory Visit: Payer: Medicare Other | Admitting: Family Medicine

## 2017-04-13 VITALS — BP 122/78 | HR 98 | Temp 97.5°F | Ht 70.0 in | Wt 215.1 lb

## 2017-04-13 DIAGNOSIS — I428 Other cardiomyopathies: Secondary | ICD-10-CM

## 2017-04-13 DIAGNOSIS — Z720 Tobacco use: Secondary | ICD-10-CM | POA: Diagnosis not present

## 2017-04-13 DIAGNOSIS — Z6830 Body mass index (BMI) 30.0-30.9, adult: Secondary | ICD-10-CM

## 2017-04-13 DIAGNOSIS — E1140 Type 2 diabetes mellitus with diabetic neuropathy, unspecified: Secondary | ICD-10-CM | POA: Diagnosis not present

## 2017-04-13 DIAGNOSIS — I10 Essential (primary) hypertension: Secondary | ICD-10-CM | POA: Diagnosis not present

## 2017-04-13 DIAGNOSIS — G4731 Primary central sleep apnea: Secondary | ICD-10-CM | POA: Diagnosis not present

## 2017-04-13 DIAGNOSIS — I70219 Atherosclerosis of native arteries of extremities with intermittent claudication, unspecified extremity: Secondary | ICD-10-CM | POA: Diagnosis not present

## 2017-04-13 DIAGNOSIS — E1159 Type 2 diabetes mellitus with other circulatory complications: Secondary | ICD-10-CM

## 2017-04-13 DIAGNOSIS — E785 Hyperlipidemia, unspecified: Secondary | ICD-10-CM | POA: Diagnosis not present

## 2017-04-13 DIAGNOSIS — E039 Hypothyroidism, unspecified: Secondary | ICD-10-CM | POA: Diagnosis not present

## 2017-04-13 DIAGNOSIS — E1169 Type 2 diabetes mellitus with other specified complication: Secondary | ICD-10-CM | POA: Diagnosis not present

## 2017-04-13 DIAGNOSIS — I152 Hypertension secondary to endocrine disorders: Secondary | ICD-10-CM

## 2017-04-13 DIAGNOSIS — G4739 Other sleep apnea: Secondary | ICD-10-CM

## 2017-04-13 DIAGNOSIS — F17210 Nicotine dependence, cigarettes, uncomplicated: Secondary | ICD-10-CM

## 2017-04-13 LAB — LIPID PANEL
Cholesterol: 234 mg/dL — ABNORMAL HIGH (ref 0–200)
HDL: 67 mg/dL (ref >39.00)
LDL Cholesterol: 142 mg/dL — ABNORMAL HIGH (ref 0–99)
NONHDL: 166.82
Total CHOL/HDL Ratio: 3
Triglycerides: 122 mg/dL (ref 0.0–149.0)
VLDL: 24.4 mg/dL (ref 0.0–40.0)

## 2017-04-13 LAB — BASIC METABOLIC PANEL
BUN: 14 mg/dL (ref 6–23)
CO2: 29 mEq/L (ref 19–32)
Calcium: 9.3 mg/dL (ref 8.4–10.5)
Chloride: 104 mEq/L (ref 96–112)
Creatinine, Ser: 1.11 mg/dL (ref 0.40–1.20)
GFR: 61.85 mL/min (ref >60.00)
Glucose, Bld: 107 mg/dL — ABNORMAL HIGH (ref 70–99)
POTASSIUM: 4.2 meq/L (ref 3.5–5.1)
Sodium: 141 mEq/L (ref 135–145)

## 2017-04-13 LAB — CBC
HCT: 41.6 % (ref 36.0–46.0)
HEMOGLOBIN: 13.2 g/dL (ref 12.0–15.0)
MCHC: 31.6 g/dL (ref 30.0–36.0)
MCV: 94.2 fl (ref 78.0–100.0)
PLATELETS: 260 10*3/uL (ref 150.0–400.0)
RBC: 4.42 Mil/uL (ref 3.87–5.11)
RDW: 14.2 % (ref 11.5–15.5)
WBC: 6.2 10*3/uL (ref 4.0–10.5)

## 2017-04-13 LAB — TSH: TSH: 4.64 u[IU]/mL — AB (ref 0.35–4.50)

## 2017-04-13 LAB — HEMOGLOBIN A1C: Hgb A1c MFr Bld: 7.1 % — ABNORMAL HIGH (ref 4.6–6.5)

## 2017-04-13 NOTE — Patient Instructions (Signed)
BEFORE YOU LEAVE: -labs -follow up: AWV with Manuela Schwartz and follow up with Dr. Maudie Mercury in May (after 5/13)  I am so so happy that you are eating healthier and giving up the cigarettes!  We have ordered labs or studies at this visit. It can take up to 1-2 weeks for results and processing. IF results require follow up or explanation, we will call you with instructions. Clinically stable results will be released to your [facility]. If you have not heard from [place] or cannot find your results in [facility] in 2 weeks please contact our office at [phone].  If you are not yet signed up for [facility], please consider signing up.   We recommend the following healthy lifestyle for LIFE: 1) Small portions. But, make sure to get regular (at least 3 per day), healthy meals and small healthy snacks if needed.  2) Eat a healthy clean diet.   TRY TO EAT: -at least 5-7 servings of low sugar, colorful, and nutrient rich vegetables per day (not corn, potatoes or bananas.) -berries are the best choice if you wish to eat fruit (only eat small amounts if trying to reduce weight)  -lean meets (fish, white meat of chicken or [place]) -vegan proteins for some meals - beans or tofu, whole grains, nuts and seeds -Replace bad fats with good fats - good fats include: fish, nuts and seeds, canola oil, olive oil -small amounts of low fat or non fat dairy -small amounts of100 % whole grains - check the lables -drink plenty of water  AVOID: -SUGAR, sweets, anything with added sugar, corn syrup or sweeteners - must read labels as even foods advertised as "healthy" often are loaded with sugar -if you must have a sweetener, small amounts of stevia may be best -sweetened beverages and artificially sweetened beverages -simple starches (rice, bread, potatoes, pasta, chips, etc - small amounts of 100% whole grains are ok) -red meat, pork, butter -fried foods, fast food, processed food, excessive dairy, eggs and coconut.  3)Get at least  150 minutes of sweaty aerobic exercise per week.  4)Reduce stress - consider counseling, meditation and relaxation to balance other aspects of your life.

## 2017-04-17 ENCOUNTER — Encounter: Payer: Self-pay | Admitting: Pulmonary Disease

## 2017-04-17 ENCOUNTER — Telehealth: Payer: Self-pay | Admitting: Family Medicine

## 2017-04-17 NOTE — Telephone Encounter (Signed)
Copied from [place]. Topic: Quick Communication - See Telephone Encounter >> Apr 17, 2017 10:26 AM Ether Griffins B wrote: Reason for CRM: has fax for cpap supplies been received? Call back number [phone]

## 2017-04-17 NOTE — Telephone Encounter (Signed)
I left a detailed message at the voicemail below stating to contact the pts pulmonologist for this order.

## 2017-04-17 NOTE — Telephone Encounter (Signed)
lmtcb x1 with [employer].

## 2017-04-19 ENCOUNTER — Other Ambulatory Visit: Payer: Self-pay

## 2017-04-19 DIAGNOSIS — G4733 Obstructive sleep apnea (adult) (pediatric): Secondary | ICD-10-CM

## 2017-05-07 ENCOUNTER — Encounter: Payer: Self-pay | Admitting: Pulmonary Disease

## 2017-05-07 ENCOUNTER — Other Ambulatory Visit: Payer: Medicare Other

## 2017-05-09 ENCOUNTER — Telehealth: Payer: Self-pay | Admitting: Internal Medicine

## 2017-05-09 NOTE — Telephone Encounter (Signed)
Order has been received, placed in VS look at folder. Will route to [facility] for follow up

## 2017-05-09 NOTE — Telephone Encounter (Signed)
Left voice mail on machine for patient to return phone call back regarding call back to schedule OV to refill cpap supplies. Patient needs f/u after new cpap machine set up, needing DL and refill on supplies per fax from [facility]. X1

## 2017-05-10 NOTE — Telephone Encounter (Signed)
Patient returning call, CB is [phone].

## 2017-05-10 NOTE — Telephone Encounter (Signed)
Patient has been scheduled for 06-04-17 for 9am with TP. Patient is aware of appointment. Nothing else needed at time of call.

## 2017-05-17 ENCOUNTER — Telehealth: Payer: Self-pay | Admitting: Pulmonary Disease

## 2017-05-17 DIAGNOSIS — G4733 Obstructive sleep apnea (adult) (pediatric): Secondary | ICD-10-CM

## 2017-05-17 NOTE — Telephone Encounter (Signed)
ATC pt, no answer. Left message for pt to call back.  

## 2017-05-17 NOTE — Telephone Encounter (Signed)
Called [facility] and was placed on hold be >70min.  Pt is aware that we will attempt to contact [employer] again regarding bipap Rx. Will try calling [employer] again.

## 2017-05-17 NOTE — Telephone Encounter (Signed)
Pt is returning call. Cb is [phone].

## 2017-05-21 ENCOUNTER — Encounter: Payer: Self-pay | Admitting: Family Medicine

## 2017-05-22 ENCOUNTER — Inpatient Hospital Stay (HOSPITAL_COMMUNITY)
Admission: EM | Admit: 2017-05-22 | Discharge: 2017-05-25 | DRG: 291 | Disposition: A | Discharge status: Home / Self Care | Payer: Medicare Other | Attending: Internal Medicine | Admitting: Internal Medicine

## 2017-05-22 ENCOUNTER — Emergency Department (HOSPITAL_COMMUNITY): Payer: Medicare Other

## 2017-05-22 ENCOUNTER — Other Ambulatory Visit: Payer: Self-pay

## 2017-05-22 ENCOUNTER — Inpatient Hospital Stay (HOSPITAL_COMMUNITY): Payer: Medicare Other

## 2017-05-22 ENCOUNTER — Encounter (HOSPITAL_COMMUNITY): Payer: Self-pay

## 2017-05-22 DIAGNOSIS — E1122 Type 2 diabetes mellitus with diabetic chronic kidney disease: Secondary | ICD-10-CM | POA: Diagnosis present

## 2017-05-22 DIAGNOSIS — E1151 Type 2 diabetes mellitus with diabetic peripheral angiopathy without gangrene: Secondary | ICD-10-CM | POA: Diagnosis present

## 2017-05-22 DIAGNOSIS — I5042 Chronic combined systolic (congestive) and diastolic (congestive) heart failure: Secondary | ICD-10-CM | POA: Diagnosis present

## 2017-05-22 DIAGNOSIS — E890 Postprocedural hypothyroidism: Secondary | ICD-10-CM | POA: Diagnosis present

## 2017-05-22 DIAGNOSIS — Z95810 Presence of automatic (implantable) cardiac defibrillator: Secondary | ICD-10-CM | POA: Diagnosis not present

## 2017-05-22 DIAGNOSIS — E119 Type 2 diabetes mellitus without complications: Secondary | ICD-10-CM

## 2017-05-22 DIAGNOSIS — I428 Other cardiomyopathies: Secondary | ICD-10-CM

## 2017-05-22 DIAGNOSIS — I130 Hypertensive heart and chronic kidney disease with heart failure and stage 1 through stage 4 chronic kidney disease, or unspecified chronic kidney disease: Secondary | ICD-10-CM | POA: Diagnosis present

## 2017-05-22 DIAGNOSIS — Z7984 Long term (current) use of oral hypoglycemic drugs: Secondary | ICD-10-CM | POA: Diagnosis not present

## 2017-05-22 DIAGNOSIS — F17210 Nicotine dependence, cigarettes, uncomplicated: Secondary | ICD-10-CM | POA: Diagnosis present

## 2017-05-22 DIAGNOSIS — Z79899 Other long term (current) drug therapy: Secondary | ICD-10-CM

## 2017-05-22 DIAGNOSIS — R0602 Shortness of breath: Secondary | ICD-10-CM | POA: Diagnosis not present

## 2017-05-22 DIAGNOSIS — E785 Hyperlipidemia, unspecified: Secondary | ICD-10-CM | POA: Diagnosis present

## 2017-05-22 DIAGNOSIS — T502X5A Adverse effect of carbonic-anhydrase inhibitors, benzothiadiazides and other diuretics, initial encounter: Secondary | ICD-10-CM | POA: Diagnosis not present

## 2017-05-22 DIAGNOSIS — E876 Hypokalemia: Secondary | ICD-10-CM | POA: Diagnosis not present

## 2017-05-22 DIAGNOSIS — I248 Other forms of acute ischemic heart disease: Secondary | ICD-10-CM | POA: Diagnosis present

## 2017-05-22 DIAGNOSIS — I10 Essential (primary) hypertension: Secondary | ICD-10-CM | POA: Diagnosis not present

## 2017-05-22 DIAGNOSIS — I340 Nonrheumatic mitral (valve) insufficiency: Secondary | ICD-10-CM | POA: Diagnosis present

## 2017-05-22 DIAGNOSIS — Z7982 Long term (current) use of aspirin: Secondary | ICD-10-CM

## 2017-05-22 DIAGNOSIS — I2510 Atherosclerotic heart disease of native coronary artery without angina pectoris: Secondary | ICD-10-CM | POA: Diagnosis present

## 2017-05-22 DIAGNOSIS — E039 Hypothyroidism, unspecified: Secondary | ICD-10-CM | POA: Diagnosis not present

## 2017-05-22 DIAGNOSIS — G4700 Insomnia, unspecified: Secondary | ICD-10-CM | POA: Diagnosis present

## 2017-05-22 DIAGNOSIS — I5043 Acute on chronic combined systolic (congestive) and diastolic (congestive) heart failure: Secondary | ICD-10-CM | POA: Diagnosis present

## 2017-05-22 DIAGNOSIS — E1169 Type 2 diabetes mellitus with other specified complication: Secondary | ICD-10-CM | POA: Diagnosis present

## 2017-05-22 DIAGNOSIS — N183 Chronic kidney disease, stage 3 (moderate): Secondary | ICD-10-CM | POA: Diagnosis present

## 2017-05-22 DIAGNOSIS — Z90710 Acquired absence of both cervix and uterus: Secondary | ICD-10-CM | POA: Diagnosis not present

## 2017-05-22 DIAGNOSIS — E1142 Type 2 diabetes mellitus with diabetic polyneuropathy: Secondary | ICD-10-CM | POA: Diagnosis not present

## 2017-05-22 LAB — CBC
HCT: 39.1 % (ref 36.0–46.0)
Hemoglobin: 12.2 g/dL (ref 12.0–15.0)
MCH: 29.4 pg (ref 26.0–34.0)
MCHC: 31.2 g/dL (ref 30.0–36.0)
MCV: 94.2 fL (ref 78.0–100.0)
Platelets: 350 10*3/uL (ref 150–400)
RBC: 4.15 MIL/uL (ref 3.87–5.11)
RDW: 14.1 % (ref 11.5–15.5)
WBC: 6.9 10*3/uL (ref 4.0–10.5)

## 2017-05-22 LAB — TROPONIN I
TROPONIN I: 0.03 ng/mL — AB (ref <0.03)
TROPONIN I: 0.03 ng/mL — AB (ref <0.03)
Troponin I: 0.03 ng/mL (ref <0.03)

## 2017-05-22 LAB — I-STAT TROPONIN, ED: TROPONIN I, POC: 0.02 ng/mL (ref 0.00–0.08)

## 2017-05-22 LAB — ECHOCARDIOGRAM COMPLETE
Height: 70 in
WEIGHTICAEL: 3320 [oz_av]

## 2017-05-22 LAB — BRAIN NATRIURETIC PEPTIDE: B Natriuretic Peptide: 515.6 pg/mL — ABNORMAL HIGH (ref 0.0–100.0)

## 2017-05-22 LAB — T4, FREE: Free T4: 1.16 ng/dL — ABNORMAL HIGH (ref 0.61–1.12)

## 2017-05-22 LAB — BASIC METABOLIC PANEL
Anion gap: 10 (ref 5–15)
BUN: 14 mg/dL (ref 6–20)
CALCIUM: 9.1 mg/dL (ref 8.9–10.3)
CO2: 22 mmol/L (ref 22–32)
CREATININE: 1.07 mg/dL — AB (ref 0.44–1.00)
Chloride: 109 mmol/L (ref 101–111)
GFR calc non Af Amer: 50 mL/min — ABNORMAL LOW (ref >60)
GFR, EST AFRICAN AMERICAN: 58 mL/min — AB (ref >60)
GLUCOSE: 155 mg/dL — AB (ref 65–99)
Potassium: 3.8 mmol/L (ref 3.5–5.1)
Sodium: 141 mmol/L (ref 135–145)

## 2017-05-22 LAB — TSH: TSH: 0.402 u[IU]/mL (ref 0.350–4.500)

## 2017-05-22 LAB — GLUCOSE, CAPILLARY: GLUCOSE-CAPILLARY: 109 mg/dL — AB (ref 65–99)

## 2017-05-22 MED ORDER — SODIUM CHLORIDE 0.9 % IV SOLN: 250.0000 mL | INTRAVENOUS | Status: DC | PRN

## 2017-05-22 MED ORDER — NICOTINE 7 MG/24HR TD PT24
7.0000 mg | MEDICATED_PATCH | Freq: Every day | TRANSDERMAL | Status: DC
  Administered 2017-05-22 – 2017-05-25 (×4): 7 mg via TRANSDERMAL
  Filled 2017-05-22 (×5): qty 1

## 2017-05-22 MED ORDER — ATORVASTATIN CALCIUM 20 MG PO TABS
60.0000 mg | ORAL_TABLET | Freq: Every day | ORAL | Status: DC
  Administered 2017-05-22 – 2017-05-24 (×3): 60 mg via ORAL
  Filled 2017-05-22 (×3): qty 1

## 2017-05-22 MED ORDER — SACUBITRIL-VALSARTAN 49-51 MG PO TABS
1.0000 | ORAL_TABLET | Freq: Two times a day (BID) | ORAL | Status: DC
  Administered 2017-05-22 – 2017-05-25 (×7): 1 via ORAL
  Filled 2017-05-22 (×7): qty 1

## 2017-05-22 MED ORDER — ENOXAPARIN SODIUM 40 MG/0.4ML ~~LOC~~ SOLN
40.0000 mg | SUBCUTANEOUS | Status: DC
  Administered 2017-05-22 – 2017-05-24 (×3): 40 mg via SUBCUTANEOUS
  Filled 2017-05-22 (×4): qty 0.4

## 2017-05-22 MED ORDER — ZOLPIDEM TARTRATE 5 MG PO TABS
5.0000 mg | ORAL_TABLET | Freq: Every evening | ORAL | Status: DC | PRN
  Administered 2017-05-23 – 2017-05-24 (×2): 5 mg via ORAL
  Filled 2017-05-22: qty 1

## 2017-05-22 MED ORDER — ISOSORBIDE MONONITRATE ER 30 MG PO TB24
30.0000 mg | ORAL_TABLET | Freq: Every day | ORAL | Status: DC
  Administered 2017-05-22 – 2017-05-25 (×4): 30 mg via ORAL
  Filled 2017-05-22 (×4): qty 1

## 2017-05-22 MED ORDER — LEVOTHYROXINE SODIUM 75 MCG PO TABS
175.0000 ug | ORAL_TABLET | Freq: Every day | ORAL | Status: DC
  Administered 2017-05-23 – 2017-05-25 (×3): 175 ug via ORAL
  Filled 2017-05-22 (×3): qty 1

## 2017-05-22 MED ORDER — SPIRONOLACTONE 25 MG PO TABS
50.0000 mg | ORAL_TABLET | Freq: Every day | ORAL | Status: DC
  Administered 2017-05-23 – 2017-05-25 (×3): 50 mg via ORAL
  Filled 2017-05-22 (×3): qty 2

## 2017-05-22 MED ORDER — FUROSEMIDE 10 MG/ML IJ SOLN
40.0000 mg | Freq: Two times a day (BID) | INTRAMUSCULAR | Status: DC
  Administered 2017-05-22 – 2017-05-24 (×4): 40 mg via INTRAVENOUS
  Filled 2017-05-22 (×4): qty 4

## 2017-05-22 MED ORDER — FUROSEMIDE 10 MG/ML IJ SOLN: 40.0000 mg | Freq: Two times a day (BID) | INTRAMUSCULAR | Status: DC

## 2017-05-22 MED ORDER — CARVEDILOL 25 MG PO TABS
25.0000 mg | ORAL_TABLET | Freq: Two times a day (BID) | ORAL | Status: DC
  Administered 2017-05-23 – 2017-05-25 (×5): 25 mg via ORAL
  Filled 2017-05-22 (×4): qty 1

## 2017-05-22 MED ORDER — SODIUM CHLORIDE 0.9% FLUSH
3.0000 mL | Freq: Two times a day (BID) | INTRAVENOUS | Status: DC
  Administered 2017-05-22 – 2017-05-25 (×5): 3 mL via INTRAVENOUS

## 2017-05-22 MED ORDER — SODIUM CHLORIDE 0.9% FLUSH: 3.0000 mL | INTRAVENOUS | Status: DC | PRN

## 2017-05-22 MED ORDER — ONDANSETRON HCL 4 MG/2ML IJ SOLN: 4.0000 mg | Freq: Four times a day (QID) | INTRAMUSCULAR | Status: DC | PRN

## 2017-05-22 MED ORDER — FUROSEMIDE 10 MG/ML IJ SOLN
40.0000 mg | Freq: Once | INTRAMUSCULAR | Status: AC
  Administered 2017-05-22: 40 mg via INTRAVENOUS
  Filled 2017-05-22: qty 4

## 2017-05-22 MED ORDER — ASPIRIN EC 81 MG PO TBEC
81.0000 mg | DELAYED_RELEASE_TABLET | Freq: Every day | ORAL | Status: DC
  Administered 2017-05-22 – 2017-05-25 (×4): 81 mg via ORAL
  Filled 2017-05-22 (×4): qty 1

## 2017-05-22 MED ORDER — CARVEDILOL 25 MG PO TABS
25.0000 mg | ORAL_TABLET | Freq: Two times a day (BID) | ORAL | Status: DC
  Administered 2017-05-22: 25 mg via ORAL
  Filled 2017-05-22: qty 1

## 2017-05-22 MED ORDER — HYDRALAZINE HCL 50 MG PO TABS
50.0000 mg | ORAL_TABLET | Freq: Three times a day (TID) | ORAL | Status: DC
  Administered 2017-05-22 – 2017-05-25 (×9): 50 mg via ORAL
  Filled 2017-05-22 (×9): qty 1

## 2017-05-22 MED ORDER — ACETAMINOPHEN 325 MG PO TABS
650.0000 mg | ORAL_TABLET | ORAL | Status: DC | PRN
  Administered 2017-05-23: 650 mg via ORAL
  Filled 2017-05-22: qty 2

## 2017-05-22 NOTE — Telephone Encounter (Signed)
Rx printed and awaiting signature, will route to [facility] to make sure Rx has been faxed.

## 2017-05-22 NOTE — Consult Note (Addendum)
Cardiology Consultation:   Patient ID: Paula Stein; [id number]; 01-Dec-1945   Admit date: 05/22/2017 Date of Consult: 05/22/2017  Primary Care Provider: Lucretia Kern, DO Primary Cardiologist: Dr. Candyce Churn   Patient Profile:   Paula Stein is a 72 y.o. female with a hx of non ischemic cardiomyopathy (cath 2009) and chronic systolic heart failure (EF 30-35% with grade 1 DD) s/p ICD implantation 2016, DM II, hypertension, PAD, and iatrogenic thyroiditis s/p iodine ablation who is being seen today for the evaluation of acute on chronic CHF exacerbation and chest pain at the request of Dr. Lorin Mercy.   History of Present Illness:   Paula Stein is a pleasant 72yo F who presented to the ED on 05/22/17 with c/o acute onset of SOB and mid-sternal, intermittent chest pain/tightness that began with the onset of her dyspnea. She reports having a fall approximately 3 weeks ago and was doing fine, however noticed that she became acutely short of breath on Sunday evening. She describes the chest pain as a sharp, intermittent "twinge" with associated diaphoresis which resolved on its own. She reports not being able to lie flat to sleep for the last two nights which is unusual for her due to severe orthopnea. She has had no LE swelling, however reports that her clothes have been fitting tighter around the waist. Her last weight at home (last week) was 194lb. She was up to 207lb today on admission, however significantly down from her last office visit with Dr. [name] on 12/18/16 at which her weight was 226lb.   In the ED, her BNP was elevated at 515. Troponin level was minimally elevated at 0.03. An EKG was performed which showed NSR with no acute ST-T wave changes. There are mild nonspecific abnormalities in leads V5-V6, consistent with previous tracings from 1 year ago. A CXR revealed cardiomegaly with vascular congestion and diffuse interstitial opacity consistent with pulmonary edema and a small pleural  effusion. She has been markedly hypertensive since admission with systolic BP's in the 696-789 range. A lipid profile was draw which revealed CH-234, HDL-67, LDL-142, Trig-122. Creatitine was mildly elevated at 1.07, however improved from her baseline of 1.2-1.3.   Primary team has continued her Entresto, Coreg, IMDUR, hydralazine and started her on IV Lasix 40mg  x1 in the ED, then 40mg  IV BID thereafter. She has an echocardiogram with pending results. She currently denies SOB/chest pain and states that she is feeling much better.  Past Medical History:  Diagnosis Date  . AICD (automatic cardioverter/defibrillator) present 10/08/2014   SUBQ    /    DR [name]  . CHF (congestive heart failure) ([place])   . Diabetes mellitus without complication ([place])   . History of cardiac cath 05/2007   normal-with patent coronaries  . History of colonoscopy   . History of hiatal hernia   . Hypertension   . Iatrogenic thyroiditis   . Non-ischemic cardiomyopathy ([place])    EF 28%- reassessment of LV function 2011 with LVEf 45-50%  . PAD (peripheral artery disease) ([facility])    lower extremities with ABIs of 0.5 bilaterally  . Personal history of goiter   . S/P radioactive iodine thyroid ablation   . S/P thyroidectomy   . Shortness of breath dyspnea     Past Surgical History:  Procedure Laterality Date  . EP IMPLANTABLE DEVICE N/A 10/08/2014   Procedure: SubQ ICD Implant;  Surgeon: [name], MD;  Location: [facility];  Service: Cardiovascular;  Laterality: N/A;  .  THYROIDECTOMY    . TONSILLECTOMY    . TOTAL ABDOMINAL HYSTERECTOMY       Prior to Admission medications   Medication Sig Start Date End Date Taking? Authorizing Provider  aspirin 81 MG tablet Take 81 mg by mouth daily.     Yes [provider]  atorvastatin (LIPITOR) 40 MG tablet Take 1.5 tablets (60 mg total) by mouth daily. 06/08/16  Yes Lucretia Kern, DO  carvedilol (COREG) 25 MG tablet Take 1 tablet (25 mg total) by mouth 2  (two) times daily with a meal. 12/18/16  Yes [name], MD  furosemide (LASIX) 40 MG tablet Take 1 tablet (40 mg total) by mouth 2 (two) times daily. 06/28/15  Yes [name], MD  glucose blood [facility] VERIO) test strip Use as instructed to check blood sugar once a day 06/09/16  Yes Lucretia Kern, DO  isosorbide mononitrate (IMDUR) 30 MG 24 hr tablet take 2 tablet by mouth once daily 07/06/16  Yes [name], MD  levothyroxine (SYNTHROID) 175 MCG tablet Take 1 tablet (175 mcg total) by mouth daily before breakfast. 02/02/17  Yes Colin Benton R, DO  metFORMIN (GLUCOPHAGE) 1000 MG tablet Take 1,000 mg by mouth every other day.   Yes [provider]  naproxen sodium (ALEVE) 220 MG tablet Take 220 mg by mouth daily as needed (headache).   Yes [provider]  Jonetta Speak LANCETS 32R MISC 1 Device by Other route daily. 06/09/16  Yes Lucretia Kern, DO  spironolactone (ALDACTONE) 50 MG tablet Take 1 tablet (50 mg total) by mouth daily. 07/15/14  Yes [name], MD  hydrALAZINE (APRESOLINE) 50 MG tablet take 1 tablet by mouth three times a day Patient not taking: Reported on 05/22/2017 12/22/15   Shawna Orleans, Doe-Hyun R, DO  sacubitril-valsartan (ENTRESTO) 49-51 MG Take 1 tablet by mouth 2 (two) times daily. 01/15/17   [name], MD    Inpatient Medications: Scheduled Meds: . nicotine  7 mg Transdermal Daily    Allergies:    Allergies  Allergen Reactions  . Ibuprofen Nausea Only    Social History:   Social History   Socioeconomic History  . Marital status: Divorced    Spouse name: Not on file  . Number of children: 1  . Years of education: Not on file  . Highest education level: Not on file  Social Needs  . Financial resource strain: Not on file  . Food insecurity - worry: Not on file  . Food insecurity - inability: Not on file  . Transportation needs - medical: Not on file  . Transportation needs - non-medical: Not on file  Occupational History  .  Occupation: Retired    Fish farm manager: RETIRED    CommentAdvertising copywriter  Tobacco Use  . Smoking status: Current Some Day Smoker    Packs/day: 0.25    Years: 40.00    Pack years: 10.00    Types: Cigarettes  . Smokeless tobacco: Never Used  . Tobacco comment: "This was the final straw"  Substance and Sexual Activity  . Alcohol use: Yes    Alcohol/week: 0.0 oz    Comment: rare glass of wine  . Drug use: No  . Sexual activity: Not Currently  Other Topics Concern  . Not on file  Social History Narrative   Retired Education officer, museum   Divorced - one grown son   current smoker    Alcohol use-no      Drug use-no    Regular  exercise- no     son - Colene Mines    Family History:   Family History  Problem Relation Age of Onset  . Diabetes type II Mother   . Hypertension Mother   . Diabetes Mother   . Heart disease Mother   . Other Father        deceased from accident age 40  . Hyperlipidemia Father    Family Status:  Family Status  Relation Name Status  . Mother  Deceased  . Father  Deceased    ROS:  Please see the history of present illness.  All other ROS reviewed and negative.     Physical Exam/Data:   Vitals:   05/22/17 1346 05/22/17 1351 05/22/17 1415 05/22/17 1430  BP: (!) 184/93  (!) 187/97 (!) 183/87  Pulse: 76  79 73  Resp: 18  (!) 26 14  Temp:      TempSrc:      SpO2: 96% 94% 95% 95%    Intake/Output Summary (Last 24 hours) at 05/22/2017 1506 Last data filed at 05/22/2017 1456 Gross per 24 hour  Intake -  Output 600 ml  Net -600 ml   There were no vitals filed for this visit. There is no height or weight on file to calculate BMI.   General: Well developed, well nourished, NAD Skin: Warm, dry, intact  Head: Normocephalic, atraumatic, clear, moist mucus membranes. Neck: Negative for carotid bruits. No JVD Lungs:Diminished in the bases. No wheezes, rales, or rhonchi. Breathing is unlabored. RA oxygenation. Cardiovascular: RRR with S1 S2. No murmurs,  rubs, or gallops Abdomen: Soft, non-tender, non-distended with normoactive bowel sounds. No obvious abdominal masses. MSK: Strength and tone appear normal for age. 5/5 in all extremities Extremities: No edema. No clubbing or cyanosis. DP/PT pulses 2+ bilaterally Neuro: Alert and oriented. No focal deficits. No facial asymmetry. MAE spontaneously. Psych: Responds to questions appropriately with normal affect.     EKG:  The EKG was personally reviewed and demonstrates: 05/22/17 NSR with non-specific T wave changes consistent with previous tracings hr 98 Telemetry:  Telemetry was personally reviewed and demonstrates: 05/22/17 NSR HR 94  Relevant CV Studies:  ECHO: 07/22/14  Study Conclusions  - Left ventricle: The cavity size was normal. There was moderate   concentric hypertrophy. Systolic function was moderately to   severely reduced. The estimated ejection fraction was in the   range of 30% to 35%. Moderate diffuse hypokinesis with no   identifiable regional variations. Doppler parameters are   consistent with abnormal left ventricular relaxation (grade 1   diastolic dysfunction). Doppler parameters are consistent with   elevated mean left atrial filling pressure. - Ventricular septum: Septal motion showed paradox. - Mitral valve: There was mild regurgitation. The acceleration rate   of the regurgitant jet was reduced, consistent with a low dP/dt. - Left atrium: The atrium was moderately to severely dilated. - Right ventricle: Systolic function was moderately reduced.  CATH: 05/13/07 Left ventriculography was performed in the RAO projection and revealed  ejection fraction of approximately 35% with diffuse hypokinesis and 2+  mitral regurgitation in the setting of ventricular ectopy.   Diagnoses  1. Mild coronary atherosclerosis as outlined including approximately      30% proximal left anterior descending stenosis and 30% mid-right      coronary artery stenosis.  No  obstructive disease is noted.  2. Mean pulmonary capillary wedge pressure of 19 with normal pulmonary      artery systolic pressure and a cardiac  output of 4.2 by the      thermodilution method.  3. Left ventricular ejection fraction of approximately 35% with 2+      mitral regurgitation in the setting of ventricular ectopy and a      left ventricular end-diastolic pressure of 18 mmHg.   DISCUSSION:  I reviewed the results with the patient.  At this point I  would anticipate ongoing medical therapy and continued treatment of  hypothyroid state.  At this point there is no clear revascularization  option.  Laboratory Data:  Chemistry Recent Labs  Lab 05/22/17 0330  NA 141  K 3.8  CL 109  CO2 22  GLUCOSE 155*  BUN 14  CREATININE 1.07*  CALCIUM 9.1  GFRNONAA 50*  GFRAA 58*  ANIONGAP 10    Total Protein  Date Value Ref Range Status  06/02/2015 8.2 6.0 - 8.3 g/dL Final   Albumin  Date Value Ref Range Status  06/02/2015 4.3 3.5 - 5.2 g/dL Final   AST  Date Value Ref Range Status  06/02/2015 13 0 - 37 U/L Final   ALT  Date Value Ref Range Status  06/02/2015 10 0 - 35 U/L Final   Alkaline Phosphatase  Date Value Ref Range Status  06/02/2015 113 39 - 117 U/L Final   Total Bilirubin  Date Value Ref Range Status  06/02/2015 0.6 0.2 - 1.2 mg/dL Final   Hematology Recent Labs  Lab 05/22/17 0330  WBC 6.9  RBC 4.15  HGB 12.2  HCT 39.1  MCV 94.2  MCH 29.4  MCHC 31.2  RDW 14.1  PLT 350   Cardiac Enzymes Recent Labs  Lab 05/22/17 1220  TROPONINI 0.03*    Recent Labs  Lab 05/22/17 0347  TROPIPOC 0.02    BNP Recent Labs  Lab 05/22/17 0330  BNP 515.6*    DDimer No results for input(s): DDIMER in the last 168 hours. TSH:  Lab Results  Component Value Date   TSH 4.64 (H) 04/13/2017   Lipids: Lab Results  Component Value Date   CHOL 234 (H) 04/13/2017   HDL 67.00 04/13/2017   LDLCALC 142 (H) 04/13/2017   LDLDIRECT 195.2 02/15/2011   TRIG  122.0 04/13/2017   CHOLHDL 3 04/13/2017   HgbA1c: Lab Results  Component Value Date   HGBA1C 7.1 (H) 04/13/2017    Radiology/Studies:  Dg Chest 2 View  Result Date: 05/22/2017 CLINICAL DATA:  Chest pain EXAM: CHEST  2 VIEW COMPARISON:  11/10/2016, 10/09/2014 FINDINGS: Left-sided pacing device with similar appearance of subcutaneous pacing lead since August 2018 over the upper sternal region, the tip appears slightly more cephalad in position. Cardiomegaly with vascular congestion and diffuse interstitial opacity consistent with pulmonary edema. Small pleural effusions. No pneumothorax. IMPRESSION: 1. Cardiomegaly with vascular congestion and diffuse interstitial opacity consistent with pulmonary edema. Small pleural effusion 2. Slight cephalad migration of the tip of the subcutaneous pacing lead since the prior study. Electronically Signed   By: Donavan Foil M.D.   On: 05/22/2017 04:01    Assessment and Plan:   1.Acute on chronic combined systolic/diastolic heart failure s/p ICD implantation: -No overt s/s of fluid volume overload  -Echo pending, last echo with EF 30-35% with subsequent ICD placed in 2016 -Continue IV lasix 40mg  BID given adequate renal function -Weight, 207lb. Last reported home weight (last week) 194lb -I&O, negative 667ml  -Lasix IV 40mg  BID, spironolactone 50mg  QD -IMDUR 30mg  QD, hydralazine 50mg  TID, Coreg 25mg  BID  2. Chest pain: -No further  chest pain since dyspnea improvement  -Continue to cycle enzymes, negative thus far (0.03, 0.03) -Last cardiac cath 2006 with non-obstructive mild disease and non-ischemic cardiomyopathy  -On ASA, BB, statin  3. HTN: -Improving, but elevated 173/93>183/87>187/97 Continue Entresto 49-51mg  BID , Coreg 25mg  BID, IMDUR 30mg  QD, hydralazine 50mg  TID -Will follow and consider increasing hydralazine and/or IMDUR  4. HLD: -Uncontrolled, CHO-234, HDL-67, LDL-142, Trig-122 -Lipitor, continue? Increase?  5. DM II: -Per  primary, SSI  -HbA1c, 7.1 on 04/13/17  6. Hypothyroidism: -TSH, 0.402 -Continue synthroid at 188mcg  7. Tobacco use: -Smoking cessation encouraged  For questions or updates, please contact [place] Please consult [email or web address] for contact info under Cardiology/STEMI.   Lyndel Safe NP-C [facility] Pager: [phone] 05/22/2017 3:06 PM  History and all data above reviewed.  Patient examined.  I agree with the findings as above.  The patient reports two days of increased dyspnea with trouble lying flat.  Some substernal heaviness.  Mild cough like a sinus drainage.  Abdominal bloating but no edema. She did eat at a [place] a couple of days ago.   The patient exam reveals COR:RRR  ,  Lungs: Clear  ,  Abd: Positive bowel sounds, no rebound no guarding, Ext No edema  .  All available labs, radiology testing, previous records reviewed. Agree with documented assessment and plan. Acute on chronic systolic HF.  I think that she has some mild volume overload probably related to increased salt as above.  I agree with the current plans for diuresis.  she does have a mild trop bump but I am not suspecting ischemia and I would not suggest ischemia work up.  She has very difficult to control HTN and is on significant therapy.  For now I would continue these meds.  We will follow up the results of the echo.    Minus Breeding  6:57 PM  05/22/2017

## 2017-05-22 NOTE — Progress Notes (Signed)
2D-Echo completed

## 2017-05-22 NOTE — Telephone Encounter (Signed)
Correct.  She needs to get set up with Bipap 18/14 cm H2O with heated humidity and mask of choice.

## 2017-05-22 NOTE — ED Notes (Signed)
Admitting MD at bedside.

## 2017-05-22 NOTE — H&P (Signed)
History and Physical    Paula Stein:[id number] DOB: [birth year] DOA: 05/22/2017  PCP: Lucretia Kern, DO Consultants:  [name] - cardiology Patient coming from:  Home - lives alone; [facility]: son, [phone]  Chief Complaint: SOB, chest pain  HPI: Paula Stein is a 72 y.o. female with medical history significant of PAD; HTN; DM; AICD placement; and CHF  (4/16 - EF 30-35% with grade 1 diastolic dysfunction) presenting with SOB, chest pain, a little headache front and back.  About 3 weeks ago, her knee just stopped and she just fell; [place] very [place].  She has not had further difficulty after the incident.  She became SOB about 2 days ago.  She was no longer able to sleep.  +orthopnea, has been sleeping sitting up.  +PND.  No LE edema.  Slight cough "because I had a little cold last week."  Cough is productive of light-colored, white phlegm.  ED Course:   Acute on chronic CHF.  No recent Echo.  Negative I-stat troponin, lab troponin 0.03.  Needs observation for diuresis/echo..  Review of Systems: As per HPI; otherwise review of systems reviewed and negative.   Ambulatory Status:  Ambulates without assistance  Past Medical History:  Diagnosis Date  . AICD (automatic cardioverter/defibrillator) present 10/08/2014   SUBQ    /    DR [name]  . CHF (congestive heart failure) ([place])   . Diabetes mellitus without complication ([place])   . History of cardiac cath 05/2007   normal-with patent coronaries  . History of colonoscopy   . History of hiatal hernia   . Hypertension   . Iatrogenic thyroiditis   . Non-ischemic cardiomyopathy ([place])    EF 28%- reassessment of LV function 2011 with LVEf 45-50%  . PAD (peripheral artery disease) (HCC)    lower extremities with ABIs of 0.5 bilaterally  . Personal history of goiter   . S/P radioactive iodine thyroid ablation   . S/P thyroidectomy   . Shortness of breath dyspnea     Past Surgical History:  Procedure  Laterality Date  . EP IMPLANTABLE DEVICE N/A 10/08/2014   Procedure: SubQ ICD Implant;  Surgeon: [name], MD;  Location: [place] [facility];  Service: Cardiovascular;  Laterality: N/A;  . THYROIDECTOMY    . TONSILLECTOMY    . TOTAL ABDOMINAL HYSTERECTOMY      Social History   Socioeconomic History  . Marital status: Divorced    Spouse name: Not on file  . Number of children: 1  . Years of education: Not on file  . Highest education level: Not on file  Social Needs  . Financial resource strain: Not on file  . Food insecurity - worry: Not on file  . Food insecurity - inability: Not on file  . Transportation needs - medical: Not on file  . Transportation needs - non-medical: Not on file  Occupational History  . Occupation: Retired    Fish farm manager: RETIRED    CommentAdvertising copywriter  Tobacco Use  . Smoking status: Current Some Day Smoker    Packs/day: 0.25    Years: 40.00    Pack years: 10.00    Types: Cigarettes  . Smokeless tobacco: Never Used  . Tobacco comment: "This was the final straw"  Substance and Sexual Activity  . Alcohol use: Yes    Alcohol/week: 0.0 oz    Comment: rare glass of wine  . Drug use: No  . Sexual activity: Not  Currently  Other Topics Concern  . Not on file  Social History Narrative   Retired Education officer, museum   Divorced - one grown son   current smoker    Alcohol use-no      Drug use-no    Regular exercise- no     son - Paula Stein    Allergies  Allergen Reactions  . Ibuprofen Nausea Only    Family History  Problem Relation Age of Onset  . Diabetes type II Mother   . Hypertension Mother   . Diabetes Mother   . Heart disease Mother   . Other Father        deceased from accident age 52  . Hyperlipidemia Father     Prior to Admission medications   Medication Sig Start Date End Date Taking? Authorizing Provider  aspirin 81 MG tablet Take 81 mg by mouth daily.     Yes [provider]  atorvastatin (LIPITOR) 40 MG tablet  Take 1.5 tablets (60 mg total) by mouth daily. 06/08/16  Yes Lucretia Kern, DO  carvedilol (COREG) 25 MG tablet Take 1 tablet (25 mg total) by mouth 2 (two) times daily with a meal. 12/18/16  Yes [name], MD  furosemide (LASIX) 40 MG tablet Take 1 tablet (40 mg total) by mouth 2 (two) times daily. 06/28/15  Yes [name], MD  glucose blood [facility] VERIO) test strip Use as instructed to check blood sugar once a day 06/09/16  Yes Lucretia Kern, DO  isosorbide mononitrate (IMDUR) 30 MG 24 hr tablet take 2 tablet by mouth once daily 07/06/16  Yes [name], MD  levothyroxine (SYNTHROID) 175 MCG tablet Take 1 tablet (175 mcg total) by mouth daily before breakfast. 02/02/17  Yes Colin Benton R, DO  metFORMIN (GLUCOPHAGE) 1000 MG tablet Take 1,000 mg by mouth every other day.   Yes [provider]  naproxen sodium (ALEVE) 220 MG tablet Take 220 mg by mouth daily as needed (headache).   Yes [provider]  Jonetta Speak LANCETS 76E MISC 1 Device by Other route daily. 06/09/16  Yes Lucretia Kern, DO  spironolactone (ALDACTONE) 50 MG tablet Take 1 tablet (50 mg total) by mouth daily. 07/15/14  Yes [name], MD  hydrALAZINE (APRESOLINE) 50 MG tablet take 1 tablet by mouth three times a day Patient not taking: Reported on 05/22/2017 12/22/15   Shawna Orleans, Doe-Hyun R, DO  levothyroxine (SYNTHROID, LEVOTHROID) 150 MCG tablet Take 1 tablet (150 mcg total) by mouth daily. Patient not taking: Reported on 05/22/2017 12/12/16   Lucretia Kern, DO  sacubitril-valsartan (ENTRESTO) 49-51 MG Take 1 tablet by mouth 2 (two) times daily. 01/15/17   [name], MD    Physical Exam: Vitals:   05/22/17 1230 05/22/17 1245 05/22/17 1346 05/22/17 1351  BP: (!) 177/86 (!) 178/88 (!) 184/93   Pulse: 77 76 76   Resp: (!) 22 (!) 23 18   Temp:      TempSrc:      SpO2: 98% 94% 96% 94%     General:  Appears calm and comfortable and is NAD Eyes:  PERRL, EOMI, normal lids, iris ENT:  grossly  normal hearing, lips & tongue, mmm Neck:  no LAD, masses or thyromegaly; no carotid bruits Cardiovascular:  RRR, no m/r/g. No LE edema.  Respiratory:   CTA bilaterally with no wheezes/rales/rhonchi.  Normal respiratory effort.  On room air. Abdomen:  soft, NT, ND, NABS Back:   normal alignment, no CVAT  Skin:  no rash or induration seen on limited exam Musculoskeletal:  grossly normal tone BUE/BLE, good ROM, no bony abnormality Psychiatric:  grossly normal mood and affect, speech fluent and appropriate, AOx3 Neurologic:  CN 2-12 grossly intact, moves all extremities in coordinated fashion, sensation intact   Radiological Exams on Admission: Dg Chest 2 View  Result Date: 05/22/2017 CLINICAL DATA:  Chest pain EXAM: CHEST  2 VIEW COMPARISON:  11/10/2016, 10/09/2014 FINDINGS: Left-sided pacing device with similar appearance of subcutaneous pacing lead since August 2018 over the upper sternal region, the tip appears slightly more cephalad in position. Cardiomegaly with vascular congestion and diffuse interstitial opacity consistent with pulmonary edema. Small pleural effusions. No pneumothorax. IMPRESSION: 1. Cardiomegaly with vascular congestion and diffuse interstitial opacity consistent with pulmonary edema. Small pleural effusion 2. Slight cephalad migration of the tip of the subcutaneous pacing lead since the prior study. Electronically Signed   By: Donavan Foil M.D.   On: 05/22/2017 04:01    EKG: Independently reviewed.  NSR with rate 98; nonspecific ST changes with no evidence of acute ischemia   Labs on Admission: I have personally reviewed the available labs and imaging studies at the time of the admission.  Pertinent labs:   Glucose 155 BUN 14/Creatinine 1.07/GFR 58 Troponin 0.02, 0.03 BNP 515.6; no prior available in Epic Normal CBC Cholesterol 04/13/17: 234/67/142/122 A1c 04/13/17: 7.1 TSH 04/13/17: 4.64  Assessment/Plan Principal Problem:   Acute on chronic combined systolic  (congestive) and diastolic (congestive) heart failure (HCC) Active Problems:   Hypothyroidism   Diabetes (HCC)   Cigarette smoker   Hyperlipidemia associated with type 2 diabetes mellitus (HCC)   Acute on chronic combined heart failure -Patient with known combined CHF presenting with worsening SOB and orthopnea -CXR consistent with pulmonary edema -Normal WBC count, no fever; low suspicion for infectious etiology -Elevated BNP, no priors for comparison -With elevated BNP and abnl CXR, CHF seems most probable as diagnosis -Will admit with telemetry -Will request repeat echocardiogram -Will continue ASA -Will continue Entresto  -Will continue Coreg -CHF order set utilized -Cardiology consult requested -Was given Lasix 40 mg x 1 in ER and will repeat with 40 mg IV BID -She is on room air at this time -Normal kidney function at this time, will follow -Repeat EKG in AM -Will r/o with serial troponins although doubt ACS based on symptoms  HTN -Continue Entresto, Coreg, hydralazine  HLD -Continue Lipitor  DM -Recent A1c shows reasonable control -hold Glucophage -Cover with moderate-scale SSI  Hypothyroidism -Check TSH and free T4; patient had a slightly elevated TSH in 1/19 -Continue Synthroid at current dose for now  Tobacco dependence -Encourage cessation.  This was discussed with the patient and should be reviewed on an ongoing basis.   -Patch ordered at patient request.   DVT prophylaxis:  Lovenox Code Status:  Full - confirmed with patient Family Communication: None present Disposition Plan:  Home once clinically improved Consults called: Cardiology; PT/OT/CM  Admission status: Admit - It is my clinical opinion that admission to INPATIENT is reasonable and necessary because of the expectation that this patient will require hospital care that crosses at least 2 midnights to treat this condition based on the medical complexity of the problems presented.  Given the  aforementioned information, the predictability of an adverse outcome is felt to be significant.    Karmen Bongo MD [facility]  If note is complete, please contact covering daytime or nighttime physician. [email or web address] Password [email or web address]  05/22/2017, 2:25 PM

## 2017-05-22 NOTE — Progress Notes (Signed)
  Echocardiogram 2D Echocardiogram has been performed.  Jennette Dubin 05/22/2017, 4:35 PM

## 2017-05-22 NOTE — Plan of Care (Signed)
  Completed/Met Education: Knowledge of General Education information will improve 05/22/2017 1817 - Completed/Met by Alonna Buckler, RN Nutrition: Adequate nutrition will be maintained 05/22/2017 1817 - Completed/Met by Alonna Buckler, RN Coping: Level of anxiety will decrease 05/22/2017 1817 - Completed/Met by Alonna Buckler, RN Elimination: Will not experience complications related to bowel motility 05/22/2017 1817 - Completed/Met by Alonna Buckler, RN Will not experience complications related to urinary retention 05/22/2017 1817 - Completed/Met by Alonna Buckler, RN Pain Managment: General experience of comfort will improve 05/22/2017 1817 - Completed/Met by Alonna Buckler, RN Safety: Ability to remain free from injury will improve 05/22/2017 1817 - Completed/Met by Alonna Buckler, RN Skin Integrity: Risk for impaired skin integrity will decrease 05/22/2017 1817 - Completed/Met by Alonna Buckler, RN

## 2017-05-22 NOTE — ED Triage Notes (Signed)
Pt states that for the past two days has been having SOB and a headache, today she started to have central CP along with dizziness, denies n/v

## 2017-05-22 NOTE — Telephone Encounter (Signed)
[facility] and spoke with Gerald Stabs, needs order with bipap settings faxed to them at [phone]  Most recent bipap settings I can find are 18/14cm.  VS please advise if this is ok to order to [employer].  Thanks!

## 2017-05-22 NOTE — ED Provider Notes (Signed)
[facility] Provider Note   CSN: [id number] Arrival date & time: 05/22/17  4008     History   Chief Complaint Chief Complaint  Patient presents with  . Chest Pain  . Shortness of Breath    HPI Paula Stein is a 72 y.o. female.  HPI   72 year old female presents today with complaints of chest pain shortness of breath.  Patient notes a history of heart failure currently taking Lasix.  She reports that usually she does not have any significant orthopnea, chest pain or shortness of breath.  She notes over the last several days she has had worsening shortness of breath with short ambulation with associated diaphoresis.  She reports 2-3 pillow orthopnea, some minor dizziness, frontal headache and a tightness sensation in her chest that is worse with ambulation.  The patient reports that she feels generally weak and tired.  She denies any lower extremity swelling or edema, fever or chills.  Patient with ICD implantation in June 2016 secondary to nonischemic cardiomyopathy with depressed left ventricular function.  Echo on April 2016 with an EF of 30-35%.    Past Medical History:  Diagnosis Date  . AICD (automatic cardioverter/defibrillator) present 10/08/2014   SUBQ    /    DR [name]  . CHF (congestive heart failure) ([place])   . Diabetes mellitus without complication ([place])   . History of cardiac cath 05/2007   normal-with patent coronaries  . History of colonoscopy   . History of hiatal hernia   . Hypertension   . Iatrogenic thyroiditis   . Non-ischemic cardiomyopathy ([place])    EF 28%- reassessment of LV function 2011 with LVEf 45-50%  . PAD (peripheral artery disease) (HCC)    lower extremities with ABIs of 0.5 bilaterally  . Personal history of goiter   . S/P radioactive iodine thyroid ablation   . S/P thyroidectomy   . Shortness of breath dyspnea     Patient Active Problem List   Diagnosis Date Noted  . Acute on chronic combined systolic (congestive)  and diastolic (congestive) heart failure ([place]) 05/22/2017  . Hyperlipidemia associated with type 2 diabetes mellitus ([place]) 12/11/2016  . Treatment-emergent central sleep apnea 09/20/2016  . NICM (nonischemic cardiomyopathy) ([place]) 10/08/2014  . Cigarette smoker 10/30/2012  . 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 DeQuervain's disease (tenosynovitis) 09/01/2010  . Atherosclerosis 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 ([place]) 12/07/2008  . Diabetes ([place]) 10/29/2008  . Hypothyroidism 01/31/2007  . GERD 01/31/2007    Past Surgical History:  Procedure Laterality Date  . EP IMPLANTABLE DEVICE N/A 10/08/2014   Procedure: SubQ ICD Implant;  Surgeon: [name], MD;  Location: [place] [facility];  Service: Cardiovascular;  Laterality: N/A;  . THYROIDECTOMY    . TONSILLECTOMY    . TOTAL ABDOMINAL HYSTERECTOMY      OB History    No data available       Home Medications    Prior to Admission medications   Medication Sig Start Date End Date Taking? Authorizing Provider  aspirin 81 MG tablet Take 81 mg by mouth daily.     Yes [provider]  atorvastatin (LIPITOR) 40 MG tablet Take 1.5 tablets (60 mg total) by mouth daily. 06/08/16  Yes Lucretia Kern, DO  carvedilol (COREG) 25 MG tablet Take 1 tablet (25 mg total) by mouth 2 (two) times daily with a meal. 12/18/16  Yes [name], MD  furosemide (LASIX) 40 MG  tablet Take 1 tablet (40 mg total) by mouth 2 (two) times daily. 06/28/15  Yes [name], MD  glucose blood [facility] VERIO) test strip Use as instructed to check blood sugar once a day 06/09/16  Yes Lucretia Kern, DO  isosorbide mononitrate (IMDUR) 30 MG 24 hr tablet take 2 tablet by mouth once daily 07/06/16  Yes [name], MD  levothyroxine (SYNTHROID) 175 MCG tablet Take 1 tablet (175 mcg total) by mouth daily before breakfast. 02/02/17  Yes Colin Benton R, DO  metFORMIN (GLUCOPHAGE) 1000 MG tablet Take 1,000 mg by mouth every other day.    Yes [provider]  naproxen sodium (ALEVE) 220 MG tablet Take 220 mg by mouth daily as needed (headache).   Yes [provider]  Jonetta Speak LANCETS 63S MISC 1 Device by Other route daily. 06/09/16  Yes Lucretia Kern, DO  spironolactone (ALDACTONE) 50 MG tablet Take 1 tablet (50 mg total) by mouth daily. 07/15/14  Yes [name], MD  hydrALAZINE (APRESOLINE) 50 MG tablet take 1 tablet by mouth three times a day Patient not taking: Reported on 05/22/2017 12/22/15   Shawna Orleans, Doe-Hyun R, DO  sacubitril-valsartan (ENTRESTO) 49-51 MG Take 1 tablet by mouth 2 (two) times daily. 01/15/17   [name], MD    Family History Family History  Problem Relation Age of Onset  . Diabetes type II Mother   . Hypertension Mother   . Diabetes Mother   . Heart disease Mother   . Other Father        deceased from accident age 8  . Hyperlipidemia Father     Social History Social History   Tobacco Use  . Smoking status: Current Some Day Smoker    Packs/day: 0.25    Years: 40.00    Pack years: 10.00    Types: Cigarettes  . Smokeless tobacco: Never Used  . Tobacco comment: "This was the final straw"  Substance Use Topics  . Alcohol use: Yes    Alcohol/week: 0.0 oz    Comment: rare glass of wine  . Drug use: No     Allergies   Ibuprofen   Review of Systems Review of Systems  All other systems reviewed and are negative.   Physical Exam Updated Vital Signs BP (!) 173/93 (BP Location: Left Arm)   Pulse 88   Temp 97.8 F (36.6 C) (Oral)   Resp 18   Ht 5\' 10"  (1.778 m)   Wt 94.1 kg (207 lb 8 oz)   SpO2 95%   BMI 29.77 kg/m   Physical Exam  Constitutional: She is oriented to person, place, and time. She appears well-developed and well-nourished.  HENT:  Head: Normocephalic and atraumatic.  Eyes: Conjunctivae are normal. Pupils are equal, round, and reactive to light. Right eye exhibits no discharge. Left eye exhibits no discharge. No scleral icterus.  Neck:  Normal range of motion. No JVD present. No tracheal deviation present.  Pulmonary/Chest: Effort normal. No stridor.  Crackles bilateral lower lobes - no wheeze   Neurological: She is alert and oriented to person, place, and time. Coordination normal.  Psychiatric: She has a normal mood and affect. Her behavior is normal. Judgment and thought content normal.  Nursing note and vitals reviewed.    ED Treatments / Results  Labs (all labs ordered are listed, but only abnormal results are displayed) Labs Reviewed  BASIC METABOLIC PANEL - Abnormal; Notable for the following components:      Result Value  Glucose, Bld 155 (*)    Creatinine, Ser 1.07 (*)    GFR calc non Af Amer 50 (*)    GFR calc Af Amer 58 (*)    All other components within normal limits  BRAIN NATRIURETIC PEPTIDE - Abnormal; Notable for the following components:   B Natriuretic Peptide 515.6 (*)    All other components within normal limits  TROPONIN I - Abnormal; Notable for the following components:   Troponin I 0.03 (*)    All other components within normal limits  T4, FREE - Abnormal; Notable for the following components:   Free T4 1.16 (*)    All other components within normal limits  TROPONIN I - Abnormal; Notable for the following components:   Troponin I 0.03 (*)    All other components within normal limits  CBC  TSH  TROPONIN I  TROPONIN I  CBC WITH DIFFERENTIAL/PLATELET  I-STAT TROPONIN, ED    EKG  EKG Interpretation  Date/Time:  Tuesday May 22 2017 03:24:32 EST Ventricular Rate:  98 PR Interval:  208 QRS Duration: 88 QT Interval:  374 QTC Calculation: 477 R Axis:   83 Text Interpretation:  Normal sinus rhythm Minimal voltage criteria for LVH, may be normal variant Nonspecific ST abnormality Abnormal ECG Confirmed by Aletta Edouard [phone]) on 05/22/2017 12:10:13 PM       Radiology Dg Chest 2 View  Result Date: 05/22/2017 CLINICAL DATA:  Chest pain EXAM: CHEST  2 VIEW COMPARISON:   11/10/2016, 10/09/2014 FINDINGS: Left-sided pacing device with similar appearance of subcutaneous pacing lead since August 2018 over the upper sternal region, the tip appears slightly more cephalad in position. Cardiomegaly with vascular congestion and diffuse interstitial opacity consistent with pulmonary edema. Small pleural effusions. No pneumothorax. IMPRESSION: 1. Cardiomegaly with vascular congestion and diffuse interstitial opacity consistent with pulmonary edema. Small pleural effusion 2. Slight cephalad migration of the tip of the subcutaneous pacing lead since the prior study. Electronically Signed   By: Donavan Foil M.D.   On: 05/22/2017 04:01    Procedures Procedures (including critical care time)  Medications Ordered in ED Medications  aspirin EC tablet 81 mg (not administered)  atorvastatin (LIPITOR) tablet 60 mg (not administered)  carvedilol (COREG) tablet 25 mg (25 mg Oral Given 05/22/17 1549)  hydrALAZINE (APRESOLINE) tablet 50 mg (50 mg Oral Given 05/22/17 1549)  isosorbide mononitrate (IMDUR) 24 hr tablet 30 mg (not administered)  levothyroxine (SYNTHROID, LEVOTHROID) tablet 175 mcg (not administered)  sacubitril-valsartan (ENTRESTO) 49-51 mg per tablet (1 tablet Oral Given 05/22/17 1549)  spironolactone (ALDACTONE) tablet 50 mg (not administered)  enoxaparin (LOVENOX) injection 40 mg (not administered)  sodium chloride flush (NS) 0.9 % injection 3 mL (3 mLs Intravenous Given 05/22/17 1550)  sodium chloride flush (NS) 0.9 % injection 3 mL (not administered)  0.9 %  sodium chloride infusion (not administered)  acetaminophen (TYLENOL) tablet 650 mg (not administered)  ondansetron (ZOFRAN) injection 4 mg (not administered)  furosemide (LASIX) injection 40 mg (not administered)  nicotine (NICODERM CQ - dosed in mg/24 hr) patch 7 mg (7 mg Transdermal Patch Applied 05/22/17 1549)  furosemide (LASIX) injection 40 mg (40 mg Intravenous Given 05/22/17 1240)     Initial Impression /  Assessment and Plan / ED Course  I have reviewed the triage vital signs and the nursing notes.  Pertinent labs & imaging results that were available during my care of the patient were reviewed by me and considered in my medical decision making (see chart for details).  Clinical Course as of May 22 1650  Tue May 23, 4031  2986 73 year old female with history of CHF here with increased shortness of breath PND and dyspnea on exertion she denies any medication changes or weight gain.  Clearly upright in a chair where normally she is able to lie flat.  She is waking up with night sweats.  Exam heart is regular rate and rhythm lungs have some bibasilar crackles.  No perpiheral edema. Checking labs, ecg, cxr. Diurese. Probable admission.   [MB]    Clinical Course User Index [MB] Hayden Rasmussen, MD     Final Clinical Impressions(s) / ED Diagnoses   Final diagnoses:  Acute on chronic combined systolic and diastolic heart failure ([place])    Labs: Troponin, i-STAT troponin, BMP, CBC, BNP   Imaging: Dg chest 2 view   Consults:  Therapeutics:  Discharge Meds:   Assessment/Plan: 72 year old female presents today with acute on chronic heart failure.  Patient with pulmonary edema, crackles, shortness of breath and an elevated BNP.  No significant ischemic changes on EKG or elevation in troponin.  Hospitalist will be consulted for observation diuresis and further workup.      ED Discharge Orders    None       Francee Gentile 05/22/17 1652    Hayden Rasmussen, MD 05/24/17 [id number]

## 2017-05-23 DIAGNOSIS — E1169 Type 2 diabetes mellitus with other specified complication: Secondary | ICD-10-CM

## 2017-05-23 DIAGNOSIS — E039 Hypothyroidism, unspecified: Secondary | ICD-10-CM

## 2017-05-23 DIAGNOSIS — E785 Hyperlipidemia, unspecified: Secondary | ICD-10-CM

## 2017-05-23 DIAGNOSIS — F17210 Nicotine dependence, cigarettes, uncomplicated: Secondary | ICD-10-CM

## 2017-05-23 DIAGNOSIS — E1142 Type 2 diabetes mellitus with diabetic polyneuropathy: Secondary | ICD-10-CM

## 2017-05-23 LAB — BASIC METABOLIC PANEL
ANION GAP: 10 (ref 5–15)
BUN: 14 mg/dL (ref 6–20)
CALCIUM: 9.1 mg/dL (ref 8.9–10.3)
CO2: 24 mmol/L (ref 22–32)
Chloride: 106 mmol/L (ref 101–111)
Creatinine, Ser: 1.03 mg/dL — ABNORMAL HIGH (ref 0.44–1.00)
GFR, EST NON AFRICAN AMERICAN: 53 mL/min — AB (ref >60)
Glucose, Bld: 125 mg/dL — ABNORMAL HIGH (ref 65–99)
Potassium: 3.4 mmol/L — ABNORMAL LOW (ref 3.5–5.1)
SODIUM: 140 mmol/L (ref 135–145)

## 2017-05-23 LAB — CBC WITH DIFFERENTIAL/PLATELET
Basophils Absolute: 0 10*3/uL (ref 0.0–0.1)
Basophils Relative: 0 %
Eosinophils Absolute: 0.1 10*3/uL (ref 0.0–0.7)
Eosinophils Relative: 1 %
HEMATOCRIT: 39.5 % (ref 36.0–46.0)
Hemoglobin: 12.3 g/dL (ref 12.0–15.0)
Lymphocytes Relative: 38 %
Lymphs Abs: 2.4 10*3/uL (ref 0.7–4.0)
MCH: 29.1 pg (ref 26.0–34.0)
MCHC: 31.1 g/dL (ref 30.0–36.0)
MCV: 93.4 fL (ref 78.0–100.0)
MONO ABS: 0.4 10*3/uL (ref 0.1–1.0)
MONOS PCT: 7 %
NEUTROS ABS: 3.3 10*3/uL (ref 1.7–7.7)
Neutrophils Relative %: 54 %
Platelets: 354 10*3/uL (ref 150–400)
RBC: 4.23 MIL/uL (ref 3.87–5.11)
RDW: 13.9 % (ref 11.5–15.5)
WBC: 6.2 10*3/uL (ref 4.0–10.5)

## 2017-05-23 LAB — GLUCOSE, CAPILLARY
GLUCOSE-CAPILLARY: 123 mg/dL — AB (ref 65–99)
GLUCOSE-CAPILLARY: 136 mg/dL — AB (ref 65–99)
GLUCOSE-CAPILLARY: 112 mg/dL — AB (ref 65–99)
Glucose-Capillary: 103 mg/dL — ABNORMAL HIGH (ref 65–99)

## 2017-05-23 LAB — TROPONIN I: Troponin I: 0.03 ng/mL (ref <0.03)

## 2017-05-23 MED ORDER — FLUTICASONE PROPIONATE 50 MCG/ACT NA SUSP
1.0000 | Freq: Every day | NASAL | Status: DC
  Administered 2017-05-23 – 2017-05-25 (×3): 1 via NASAL
  Filled 2017-05-23: qty 16

## 2017-05-23 MED ORDER — POTASSIUM CHLORIDE CRYS ER 20 MEQ PO TBCR
40.0000 meq | EXTENDED_RELEASE_TABLET | Freq: Once | ORAL | Status: AC
  Administered 2017-05-23: 40 meq via ORAL
  Filled 2017-05-23: qty 2

## 2017-05-23 NOTE — Progress Notes (Signed)
PROGRESS NOTE    Paula Stein  AOZ:[id number] DOB: [birth year] DOA: 05/22/2017 PCP: Lucretia Kern, DO   Chief Complaint  Patient presents with  . Chest Pain  . Shortness of Breath    Brief Narrative:  HPI on 05/22/2017 by Dr. Karmen Bongo Paula Stein is a 72 y.o. female with medical history significant of PAD; HTN; DM; AICD placement; and CHF  (4/16 - EF 30-35% with grade 1 diastolic dysfunction) presenting with SOB, chest pain, a little headache front and back.  About 3 weeks ago, her knee just stopped and she just fell; [place].  She has not had further difficulty after the incident.  She became SOB about 2 days ago.  She was no longer able to sleep.  +orthopnea, has been sleeping sitting up.  +PND.  No LE edema.  Slight cough "because I had a little cold last week."  Cough is productive of light-colored, white phlegm.  Interim history Patient admitted for acute on chronic combined heart failure exacerbation.  Cardiology consulted and appreciated.  Assessment & Plan   Acute on chronic combined systolic/diastolic heart failure -Patient presented with worsening shortness of breath and orthopnea  -chest x-ray reviewed and was consistent with pulmonary edema -BNP 515.6 -Echocardiogram shows an EF of 96-29%, grade 1 diastolic dysfunction (EF April 2016 was 30-35%) -Cardiology consulted and appreciated -Continue Lasix 40 mg IV twice daily -Monitor intake and output, daily weights -Urine output over the past 24 hours 3000 cc -Continue Entresto, Coreg, spironolactone  Mildly elevated troponin -Troponin 0 0.03, denies current chest pain -Suspect secondary to demand ischemia due to heart failure exacerbation  Essential hypertension -Continue Entresto, Coreg, hydralazine  Hyperlipidemia  -Continue statin  Diabetes mellitus, type II -Glucophage held -Continue insulin sliding scale CBG monitoring  Hypothyroidism -TSH 0.402, FT4  1.16 -Continue Synthroid -continue to monitor as an outpatient   Tobacco dependence -Discussed cessation -Continue nicotine patch  Hypokalemia -Likely secondary to diuresis, will replace continue monitor BMP  DVT Prophylaxis Lovenox  Code Status: Full  Family Communication: None at bedside  Disposition Plan: Admitted, suspect will need diuresis for the next 24-48 hours.  Pending further recommendations from cardiology.  Home when stable.  Consultants Cardiology  Procedures  Echocardiogram  Antibiotics   Anti-infectives (From admission, onward)   None      Subjective:   Paula Stein seen and examined today.  Patient denies any chest pain.  Feels her shortness of breath has mildly improved however not has not been up moving.  Patient states she is able to sleep well last night.  Denies current abdominal pain, nausea or vomiting, diarrhea or constipation, dizziness or headache.   Objective:   Vitals:   05/22/17 1504 05/22/17 1924 05/23/17 0617 05/23/17 1200  BP: (!) 173/93 137/71 138/83 124/70  Pulse: 88 79 83 73  Resp: 18 18 18 20   Temp: 97.8 F (36.6 C) 97.7 F (36.5 C) 98 F (36.7 C) 98 F (36.7 C)  TempSrc: Oral Oral Oral Oral  SpO2: 95% 95% 98% 96%  Weight: 94.1 kg (207 lb 8 oz)  92.1 kg (203 lb)   Height: 5\' 10"  (1.778 m)       Intake/Output Summary (Last 24 hours) at 05/23/2017 1323 Last data filed at 05/23/2017 1100 Gross per 24 hour  Intake 960 ml  Output 3500 ml  Net -2540 ml   Filed Weights   05/22/17 1504 05/23/17 0617  Weight: 94.1 kg (  207 lb 8 oz) 92.1 kg (203 lb)    Exam  General: Well developed, well nourished, NAD, appears stated age  108: NCAT, mucous membranes moist.   Neck: Supple, no JVD, no masses  Cardiovascular: S1 S2 auscultated, no rubs, murmurs or gallops. Regular rate and rhythm.  Respiratory: diminished, No wheezing rhonchi or rales.  Abdomen: Soft, nontender, nondistended, + bowel sounds  Extremities: warm dry  without cyanosis clubbing or edema  Neuro: AAOx3, nonfocal  Psych: Normal affect and demeanor with intact judgement and insight   Data Reviewed: I have personally reviewed following labs and imaging studies  CBC: Recent Labs  Lab 05/22/17 0330 05/23/17 0239  WBC 6.9 6.2  NEUTROABS  --  3.3  HGB 12.2 12.3  HCT 39.1 39.5  MCV 94.2 93.4  PLT 350 102   Basic Metabolic Panel: Recent Labs  Lab 05/22/17 0330 05/23/17 0239  NA 141 140  K 3.8 3.4*  CL 109 106  CO2 22 24  GLUCOSE 155* 125*  BUN 14 14  CREATININE 1.07* 1.03*  CALCIUM 9.1 9.1   GFR: Estimated Creatinine Clearance: 59.8 mL/min (A) (by C-G formula based on SCr of 1.03 mg/dL (H)). Liver Function Tests: No results for input(s): AST, ALT, ALKPHOS, BILITOT, PROT, ALBUMIN in the last 168 hours. No results for input(s): LIPASE, AMYLASE in the last 168 hours. No results for input(s): AMMONIA in the last 168 hours. Coagulation Profile: No results for input(s): INR, PROTIME in the last 168 hours. Cardiac Enzymes: Recent Labs  Lab 05/22/17 1220 05/22/17 1450 05/22/17 2018 05/23/17 0239  TROPONINI 0.03* 0.03* 0.03* 0.03*   BNP (last 3 results) No results for input(s): PROBNP in the last 8760 hours. HbA1C: No results for input(s): HGBA1C in the last 72 hours. CBG: Recent Labs  Lab 05/22/17 2208 05/23/17 0813 05/23/17 1150  GLUCAP 109* 123* 103*   Lipid Profile: No results for input(s): CHOL, HDL, LDLCALC, TRIG, CHOLHDL, LDLDIRECT in the last 72 hours. Thyroid Function Tests: Recent Labs    05/22/17 1425  TSH 0.402  FREET4 1.16*   Anemia Panel: No results for input(s): VITAMINB12, FOLATE, FERRITIN, TIBC, IRON, RETICCTPCT in the last 72 hours. Urine analysis:    Component Value Date/Time   COLORURINE YELLOW 01/28/2007 1647   APPEARANCEUR CLOUDY (A) 01/28/2007 1647   LABSPEC 1.025 01/28/2007 1647   PHURINE 5.5 01/28/2007 1647   GLUCOSEU NEGATIVE 01/28/2007 1647   HGBUR MODERATE (A) 01/28/2007  1647   BILIRUBINUR NEGATIVE 01/28/2007 1647   KETONESUR NEGATIVE 01/28/2007 1647   PROTEINUR >300 (A) 01/28/2007 1647   UROBILINOGEN 0.2 01/28/2007 1647   NITRITE POSITIVE (A) 01/28/2007 1647   LEUKOCYTESUR NEGATIVE 01/28/2007 1647   Sepsis Labs: @LABRCNTIP (procalcitonin:4,lacticidven:4)  )No results found for this or any previous visit (from the past 240 hour(s)).    Radiology Studies: Dg Chest 2 View  Result Date: 05/22/2017 CLINICAL DATA:  Chest pain EXAM: CHEST  2 VIEW COMPARISON:  11/10/2016, 10/09/2014 FINDINGS: Left-sided pacing device with similar appearance of subcutaneous pacing lead since August 2018 over the upper sternal region, the tip appears slightly more cephalad in position. Cardiomegaly with vascular congestion and diffuse interstitial opacity consistent with pulmonary edema. Small pleural effusions. No pneumothorax. IMPRESSION: 1. Cardiomegaly with vascular congestion and diffuse interstitial opacity consistent with pulmonary edema. Small pleural effusion 2. Slight cephalad migration of the tip of the subcutaneous pacing lead since the prior study. Electronically Signed   By: Donavan Foil M.D.   On: 05/22/2017 04:01  Scheduled Meds: . aspirin EC  81 mg Oral Daily  . atorvastatin  60 mg Oral q1800  . carvedilol  25 mg Oral BID WC  . enoxaparin (LOVENOX) injection  40 mg Subcutaneous Q24H  . fluticasone  1 spray Each Nare Daily  . furosemide  40 mg Intravenous BID  . hydrALAZINE  50 mg Oral TID  . isosorbide mononitrate  30 mg Oral Daily  . levothyroxine  175 mcg Oral QAC breakfast  . nicotine  7 mg Transdermal Daily  . sacubitril-valsartan  1 tablet Oral BID  . sodium chloride flush  3 mL Intravenous Q12H  . spironolactone  50 mg Oral Daily   Continuous Infusions: . sodium chloride       LOS: 1 day   Time Spent in minutes   30 minutes  Toddrick Sanna D.O. on 05/23/2017 at 1:23 PM  Between 7am to 7pm - Pager - [phone]  After 7pm go to  [email or web address] - password [email or web address]  And look for the night coverage person covering for me after hours  [facility] Office  [phone]

## 2017-05-23 NOTE — Research (Signed)
RESEARCH ENCOUNTER  Patient ID: Paula Stein  DOB: February 10, 1946  [name] met inclusion/exclusion criteria for the Galactic-HF Study. Spoke with patient at bedside. Provided pt with Galactic-HF Study information and informed consent. Answered questions and concerns.  Informed consent left at bedside for patient to review in private.  Will follow up with patient.

## 2017-05-23 NOTE — Telephone Encounter (Signed)
           Faxed RX for bipap settings to [facility] to fax [phone] today, placed in scan folder

## 2017-05-23 NOTE — Evaluation (Signed)
Physical Therapy Evaluation Patient Details Name: Paula Stein MRN: [id number] DOB: 1946-02-12 Today's Date: 05/23/2017   History of Present Illness  Pt is a 72 y.o. admitted 05/22/17 with c/o SOB and chest pain; worked up for acute on chronic CHF. PMH includes AICD, HTN, PAD, DM, tobacco use.     Clinical Impression  Patient evaluated by Physical Therapy with no further acute PT needs identified. Pt currently indep with all mobility. Educ on LE therex secondary to c/o intermittent R knee buckling at baseline (strength WFL). Pt demonstrates good awareness of activity tolerance and energy conservation. All education has been completed and the patient has no further questions. Pt encouraged to continue ambulating during hospital stay with RN staff. PT is signing off. Thank you for this referral.    Follow Up Recommendations No PT follow up    Equipment Recommendations  None recommended by PT    Recommendations for Other Services       Precautions / Restrictions Precautions Precautions: None Restrictions Weight Bearing Restrictions: No      Mobility  Bed Mobility Overal bed mobility: Independent                Transfers Overall transfer level: Independent                  Ambulation/Gait Ambulation/Gait assistance: Independent Ambulation Distance (Feet): 250 Feet Assistive device: None Gait Pattern/deviations: Step-through pattern;Decreased stride length Gait velocity: Decreased Gait velocity interpretation: <1.8 ft/sec, indicative of risk for recurrent falls General Gait Details: Slow, controlled amb indep. 1x standing rest break secondary to SOB. Pt demonstrates good awareness of activity tolerance and energy conservation  Stairs            Wheelchair Mobility    Modified Rankin (Stroke Patients Only)       Balance Overall balance assessment: Needs assistance   Sitting balance-Leahy Scale: Normal Sitting balance - Comments: Able to don socks  sitting EOB indep     Standing balance-Leahy Scale: Good Standing balance comment: Indep to don pants in standing                             Pertinent Vitals/Pain Pain Assessment: No/denies pain    Home Living Family/patient expects to be discharged to:: Private residence Living Arrangements: Alone Available Help at Discharge: Family;Friend(s);Available PRN/intermittently Type of Home: House Home Access: Stairs to enter Entrance Stairs-Rails: Right Entrance Stairs-Number of Steps: 3 Home Layout: One level Home Equipment: Cane - single point;Shower seat      Prior Function Level of Independence: Independent         Comments: Leaves SPC in car for when she is not able to park close and has to walk further distance     Hand Dominance        Extremity/Trunk Assessment   Upper Extremity Assessment Upper Extremity Assessment: Overall WFL for tasks assessed    Lower Extremity Assessment Lower Extremity Assessment: Overall WFL for tasks assessed(c/o intermittent R knee buckling at baseline; RLE strength WFL)    Cervical / Trunk Assessment Cervical / Trunk Assessment: Normal  Communication   Communication: No difficulties  Cognition Arousal/Alertness: Awake/alert Behavior During Therapy: WFL for tasks assessed/performed Overall Cognitive Status: Within Functional Limits for tasks assessed  General Comments      Exercises General Exercises - Lower Extremity Quad Sets: AROM;Right;10 reps;Seated Long Arc Quad: AROM;Right;10 reps;Seated(5 sec hold)   Assessment/Plan    PT Assessment Patent does not need any further PT services  PT Problem List         PT Treatment Interventions      PT Goals (Current goals can be found in the Care Plan section)  Acute Rehab PT Goals PT Goal Formulation: All assessment and education complete, DC therapy    Frequency     Barriers to discharge         Co-evaluation               AM-PAC PT "6 Clicks" Daily Activity  Outcome Measure Difficulty turning over in bed (including adjusting bedclothes, sheets and blankets)?: None Difficulty moving from lying on back to sitting on the side of the bed? : None Difficulty sitting down on and standing up from a chair with arms (e.g., wheelchair, bedside commode, etc,.)?: None Help needed moving to and from a bed to chair (including a wheelchair)?: None Help needed walking in hospital room?: None Help needed climbing 3-5 steps with a railing? : A Little 6 Click Score: 23    End of Session Equipment Utilized During Treatment: Gait belt Activity Tolerance: Patient tolerated treatment well Patient left: in chair;with call bell/phone within reach Nurse Communication: Mobility status PT Visit Diagnosis: Other abnormalities of gait and mobility (R26.89)    Time: 9753-0051 PT Time Calculation (min) (ACUTE ONLY): 20 min   Charges:   PT Evaluation $PT Eval Moderate Complexity: 1 Mod     PT G Codes:       Mabeline Caras, PT, DPT Acute Rehab Services  Pager: [place] 05/23/2017, 12:07 PM

## 2017-05-23 NOTE — Progress Notes (Signed)
OT Cancellation Note  Patient Details Name: Paula Stein MRN: [id number] DOB: [birth year]   Cancelled Treatment:    Reason Eval/Treat Not Completed: OT screened, no needs identified, will sign off.  Hortencia Pilar 05/23/2017, 12:20 PM

## 2017-05-23 NOTE — Progress Notes (Addendum)
Progress Note  Patient Name: Paula Stein Date of Encounter: 05/23/2017  Primary Cardiologist: [name], MD   Subjective   Pt has no complaints. She reports her abdominal swelling has decreased.  Inpatient Medications    Scheduled Meds: . aspirin EC  81 mg Oral Daily  . atorvastatin  60 mg Oral q1800  . carvedilol  25 mg Oral BID WC  . enoxaparin (LOVENOX) injection  40 mg Subcutaneous Q24H  . fluticasone  1 spray Each Nare Daily  . furosemide  40 mg Intravenous BID  . hydrALAZINE  50 mg Oral TID  . isosorbide mononitrate  30 mg Oral Daily  . levothyroxine  175 mcg Oral QAC breakfast  . nicotine  7 mg Transdermal Daily  . sacubitril-valsartan  1 tablet Oral BID  . sodium chloride flush  3 mL Intravenous Q12H  . spironolactone  50 mg Oral Daily   Continuous Infusions: . sodium chloride     PRN Meds: sodium chloride, acetaminophen, ondansetron (ZOFRAN) IV, sodium chloride flush, zolpidem   Vital Signs    Vitals:   05/22/17 1504 05/22/17 1924 05/23/17 0617 05/23/17 1200  BP: (!) 173/93 137/71 138/83 124/70  Pulse: 88 79 83 73  Resp: 18 18 18 20   Temp: 97.8 F (36.6 C) 97.7 F (36.5 C) 98 F (36.7 C) 98 F (36.7 C)  TempSrc: Oral Oral Oral Oral  SpO2: 95% 95% 98% 96%  Weight: 207 lb 8 oz (94.1 kg)  203 lb (92.1 kg)   Height: 5\' 10"  (1.778 m)       Intake/Output Summary (Last 24 hours) at 05/23/2017 1342 Last data filed at 05/23/2017 1100 Gross per 24 hour  Intake 960 ml  Output 3500 ml  Net -2540 ml   Filed Weights   05/22/17 1504 05/23/17 0617  Weight: 207 lb 8 oz (94.1 kg) 203 lb (92.1 kg)    Telemetry    sinus - Personally Reviewed  ECG    No new tracings - Personally Reviewed  Physical Exam   GEN: No acute distress.   Neck: No JVD Cardiac: RRR, no murmurs, rubs, or gallops.  Respiratory: Clear to auscultation bilaterally. GI: Soft, nontender, non-distended  MS: No edema; No deformity. Neuro:  Nonfocal  Psych: Normal affect    Labs    Chemistry Recent Labs  Lab 05/22/17 0330 05/23/17 0239  NA 141 140  K 3.8 3.4*  CL 109 106  CO2 22 24  GLUCOSE 155* 125*  BUN 14 14  CREATININE 1.07* 1.03*  CALCIUM 9.1 9.1  GFRNONAA 50* 53*  GFRAA 58* >60  ANIONGAP 10 10     Hematology Recent Labs  Lab 05/22/17 0330 05/23/17 0239  WBC 6.9 6.2  RBC 4.15 4.23  HGB 12.2 12.3  HCT 39.1 39.5  MCV 94.2 93.4  MCH 29.4 29.1  MCHC 31.2 31.1  RDW 14.1 13.9  PLT 350 354    Cardiac Enzymes Recent Labs  Lab 05/22/17 1220 05/22/17 1450 05/22/17 2018 05/23/17 0239  TROPONINI 0.03* 0.03* 0.03* 0.03*    Recent Labs  Lab 05/22/17 0347  TROPIPOC 0.02     BNP Recent Labs  Lab 05/22/17 0330  BNP 515.6*     DDimer No results for input(s): DDIMER in the last 168 hours.   Radiology    Dg Chest 2 View  Result Date: 05/22/2017 CLINICAL DATA:  Chest pain EXAM: CHEST  2 VIEW COMPARISON:  11/10/2016, 10/09/2014 FINDINGS: Left-sided pacing device with similar appearance of subcutaneous pacing  lead since August 2018 over the upper sternal region, the tip appears slightly more cephalad in position. Cardiomegaly with vascular congestion and diffuse interstitial opacity consistent with pulmonary edema. Small pleural effusions. No pneumothorax. IMPRESSION: 1. Cardiomegaly with vascular congestion and diffuse interstitial opacity consistent with pulmonary edema. Small pleural effusion 2. Slight cephalad migration of the tip of the subcutaneous pacing lead since the prior study. Electronically Signed   By: Donavan Foil M.D.   On: 05/22/2017 04:01    Cardiac Studies   ECHO: 07/22/14  Study Conclusions - Left ventricle: The cavity size was normal. There was moderate concentric hypertrophy. Systolic function was moderately to severely reduced. The estimated ejection fraction was in the range of 30% to 35%. Moderate diffuse hypokinesis with no identifiable regional variations. Doppler parameters  are consistent with abnormal left ventricular relaxation (grade 1 diastolic dysfunction). Doppler parameters are consistent with elevated mean left atrial filling pressure. - Ventricular septum: Septal motion showed paradox. - Mitral valve: There was mild regurgitation. The acceleration rate of the regurgitant jet was reduced, consistent with a low dP/dt. - Left atrium: The atrium was moderately to severely dilated. - Right ventricle: Systolic function was moderately reduced.  CATH: 05/13/07 Left ventriculography was performed in the RAO projection and revealed ejection fraction of approximately 35% with diffuse hypokinesis and 2+ mitral regurgitation in the setting of ventricular ectopy.  Diagnoses 1. Mild coronary atherosclerosis as outlined including approximately 30% proximal left anterior descending stenosis and 30% mid-right coronary artery stenosis. No obstructive disease is noted. 2. Mean pulmonary capillary wedge pressure of 19 with normal pulmonary artery systolic pressure and a cardiac output of 4.2 by the thermodilution method. 3. Left ventricular ejection fraction of approximately 35% with 2+ mitral regurgitation in the setting of ventricular ectopy and a left ventricular end-diastolic pressure of 18 mmHg.  DISCUSSION: I reviewed the results with the patient. At this point I would anticipate ongoing medical therapy and continued treatment of hypothyroid state. At this point there is no clear revascularization option.  Patient Profile     72 y.o. female with a hx of non ischemic cardiomyopathy (cath 2009) and chronic systolic heart failure (EF 30-35% with grade 1 DD) s/p ICD implantation 2016, DM II, hypertension, PAD, and iatrogenic thyroiditis s/p iodine ablation who is being seen today for the evaluation of acute on chronic CHF exacerbation and chest pain.  Assessment & Plan    1. Acute on chronic combined  systolic and diastolic heart failure s/p ICD placement, nonischemic cardiomyopathy She is diuresing on 40 mg IV lasix BID. She is overall net negative 2.5 L with 3L urine output yesterday. Weight is 203 lbs from 207 lbs on admission. Dry weight is thought to be near 194 lbs (home weight). Continue diuresis.  She admitted that she gained 6 lbs on her home scales but ignored the weight gain. Had a lengthy discussion about salt, smoking, and monitoring her weight to keep her out of the hospital.   2. Chest pain Pt denies chest pain. Enzymes remained negative. Will defer further ischemic workup at this time.    3. HTN On entresto, coreg, imdur, and hydralazine Pressures well-controlled today,   4. HLD 04/13/2017: Cholesterol 234; HDL 67.00; LDL Cholesterol 142; Triglycerides 122.0; VLDL 24.4 Given her DM and > 7.5% ASCVD risk, she should stay on a high dose statin. Continue 60 mg Lipitor.  5. Tobacco use Pt has decreased to 1 pack per week. She is currently wearing a nicotine patch and is committed  to stopping smoking at discharge.    For questions or updates, please contact [place] Please consult [email or web address] for contact info under Cardiology/STEMI.      Signed, Tami Lin Duke, PA  05/23/2017, 1:42 PM    History and all data above reviewed.  Patient examined.  I agree with the findings as above. No chest pain.  Breathing better.  The patient exam reveals COR:RRR  ,  Lungs: Clear  ,  Abd: Positive bowel sounds, no rebound no guarding, Ext No edema  .  All available labs, radiology testing, previous records reviewed. Agree with documented assessment and plan. Still not breathing quite at baseline and weight is not at "dry weight".  Continue IV diuresis today.    Jeneen Rinks Cecilia Nishikawa  2:12 PM  05/23/2017

## 2017-05-23 NOTE — Research (Signed)
GalacticInformed Consent  Subject Name: Paula Stein  Subject met inclusion and exclusion criteria. The informed consent form, study requirements and expectations were reviewed with the subject and questions and concerns were addressed prior to the signing of the consent form. The subject verbalized understanding of the trail requirements. The subject agreed to participate in the Galactictrial and signed the informed consent. The informed consent was obtained prior to performance of any protocol-specific procedures for the subject. A copy of the signed informed consent was given to the subject and a copy was placed in the subject's medical record.  Duncan Dull, RN 05/23/2017

## 2017-05-24 LAB — BASIC METABOLIC PANEL
Anion gap: 11 (ref 5–15)
BUN: 18 mg/dL (ref 6–20)
CO2: 24 mmol/L (ref 22–32)
CREATININE: 1.22 mg/dL — AB (ref 0.44–1.00)
Calcium: 9.5 mg/dL (ref 8.9–10.3)
Chloride: 106 mmol/L (ref 101–111)
GFR calc Af Amer: 50 mL/min — ABNORMAL LOW (ref >60)
GFR calc non Af Amer: 43 mL/min — ABNORMAL LOW (ref >60)
Glucose, Bld: 120 mg/dL — ABNORMAL HIGH (ref 65–99)
POTASSIUM: 3.8 mmol/L (ref 3.5–5.1)
Sodium: 141 mmol/L (ref 135–145)

## 2017-05-24 LAB — GLUCOSE, CAPILLARY
GLUCOSE-CAPILLARY: 115 mg/dL — AB (ref 65–99)
GLUCOSE-CAPILLARY: 118 mg/dL — AB (ref 65–99)
Glucose-Capillary: 115 mg/dL — ABNORMAL HIGH (ref 65–99)
Glucose-Capillary: 131 mg/dL — ABNORMAL HIGH (ref 65–99)

## 2017-05-24 LAB — CBC
HEMATOCRIT: 41.7 % (ref 36.0–46.0)
Hemoglobin: 13.1 g/dL (ref 12.0–15.0)
MCH: 29.3 pg (ref 26.0–34.0)
MCHC: 31.4 g/dL (ref 30.0–36.0)
MCV: 93.3 fL (ref 78.0–100.0)
PLATELETS: 335 10*3/uL (ref 150–400)
RBC: 4.47 MIL/uL (ref 3.87–5.11)
RDW: 14 % (ref 11.5–15.5)
WBC: 7.8 10*3/uL (ref 4.0–10.5)

## 2017-05-24 LAB — HEPATIC FUNCTION PANEL
ALBUMIN: 3.2 g/dL — AB (ref 3.5–5.0)
ALK PHOS: 104 U/L (ref 38–126)
ALT: 11 U/L — AB (ref 14–54)
AST: 15 U/L (ref 15–41)
BILIRUBIN TOTAL: 0.6 mg/dL (ref 0.3–1.2)
Bilirubin, Direct: <0.1 mg/dL — ABNORMAL LOW (ref 0.1–0.5)
TOTAL PROTEIN: 7.5 g/dL (ref 6.5–8.1)

## 2017-05-24 LAB — MISC LABCORP TEST (SEND OUT): LABCORP TEST CODE: 143000

## 2017-05-24 MED ORDER — FUROSEMIDE 40 MG PO TABS
40.0000 mg | ORAL_TABLET | Freq: Two times a day (BID) | ORAL | Status: DC
  Administered 2017-05-24 – 2017-05-25 (×2): 40 mg via ORAL
  Filled 2017-05-24 (×2): qty 1

## 2017-05-24 MED ORDER — ZOLPIDEM TARTRATE 5 MG PO TABS
5.0000 mg | ORAL_TABLET | Freq: Once | ORAL | Status: DC
  Filled 2017-05-24: qty 1

## 2017-05-24 NOTE — Progress Notes (Signed)
Progress Note  Patient Name: Paula Stein Date of Encounter: 05/24/2017  Primary Cardiologist:   [name], MD   Subjective   No SOB.  She is fatigued.    Inpatient Medications    Scheduled Meds: . aspirin EC  81 mg Oral Daily  . atorvastatin  60 mg Oral q1800  . carvedilol  25 mg Oral BID WC  . enoxaparin (LOVENOX) injection  40 mg Subcutaneous Q24H  . fluticasone  1 spray Each Nare Daily  . furosemide  40 mg Intravenous BID  . hydrALAZINE  50 mg Oral TID  . isosorbide mononitrate  30 mg Oral Daily  . levothyroxine  175 mcg Oral QAC breakfast  . nicotine  7 mg Transdermal Daily  . sacubitril-valsartan  1 tablet Oral BID  . sodium chloride flush  3 mL Intravenous Q12H  . spironolactone  50 mg Oral Daily   Continuous Infusions: . sodium chloride     PRN Meds: sodium chloride, acetaminophen, ondansetron (ZOFRAN) IV, sodium chloride flush, zolpidem   Vital Signs    Vitals:   05/23/17 2027 05/24/17 0556 05/24/17 0855 05/24/17 1102  BP: 124/61 112/78 (!) 133/53 (!) 94/59  Pulse: 79 87 88 84  Resp: 20 18 20    Temp: (!) 97.5 F (36.4 C) 98.4 F (36.9 C) 98.4 F (36.9 C)   TempSrc: Oral Oral Oral   SpO2: 100% 100% 99% 98%  Weight:  202 lb 11.2 oz (91.9 kg)    Height:        Intake/Output Summary (Last 24 hours) at 05/24/2017 1122 Last data filed at 05/24/2017 0940 Gross per 24 hour  Intake 840 ml  Output 1600 ml  Net -760 ml   Filed Weights   05/22/17 1504 05/23/17 0617 05/24/17 0556  Weight: 207 lb 8 oz (94.1 kg) 203 lb (92.1 kg) 202 lb 11.2 oz (91.9 kg)    Telemetry    NSR - Personally Reviewed  ECG    NA - Personally Reviewed  Physical Exam   GEN: No acute distress.   Neck: No  JVD Cardiac: RRR, no murmurs, rubs, or gallops.  Respiratory: Clear  to auscultation bilaterally. GI: Soft, nontender, non-distended  MS: No  edema; No deformity. Neuro:  Nonfocal  Psych: Normal affect   Labs    Chemistry Recent Labs  Lab 05/22/17 0330  05/23/17 0239 05/24/17 0513  NA 141 140 141  K 3.8 3.4* 3.8  CL 109 106 106  CO2 22 24 24   GLUCOSE 155* 125* 120*  BUN 14 14 18   CREATININE 1.07* 1.03* 1.22*  CALCIUM 9.1 9.1 9.5  PROT  --   --  7.5  ALBUMIN  --   --  3.2*  AST  --   --  15  ALT  --   --  11*  ALKPHOS  --   --  104  BILITOT  --   --  0.6  GFRNONAA 50* 53* 43*  GFRAA 58* >60 50*  ANIONGAP 10 10 11      Hematology Recent Labs  Lab 05/22/17 0330 05/23/17 0239 05/24/17 0513  WBC 6.9 6.2 7.8  RBC 4.15 4.23 4.47  HGB 12.2 12.3 13.1  HCT 39.1 39.5 41.7  MCV 94.2 93.4 93.3  MCH 29.4 29.1 29.3  MCHC 31.2 31.1 31.4  RDW 14.1 13.9 14.0  PLT 350 354 335    Cardiac Enzymes Recent Labs  Lab 05/22/17 1220 05/22/17 1450 05/22/17 2018 05/23/17 0239  TROPONINI 0.03* 0.03* 0.03* 0.03*  Recent Labs  Lab 05/22/17 0347  TROPIPOC 0.02     BNP Recent Labs  Lab 05/22/17 0330  BNP 515.6*     DDimer No results for input(s): DDIMER in the last 168 hours.   Radiology    No results found.  Cardiac Studies   ECHO:   05/22/16  Study Conclusions  - Left ventricle: The cavity size was normal. There was severe   concentric hypertrophy. Systolic function was severely reduced.   The estimated ejection fraction was in the range of 25% to 30%.   Diffuse hypokinesis. Doppler parameters are consistent with   abnormal left ventricular relaxation (grade 1 diastolic   dysfunction). Doppler parameters are consistent with   indeterminate ventricular filling pressure. - Aortic valve: Transvalvular velocity was within the normal range.   There was no stenosis. There was no regurgitation. - Mitral valve: Transvalvular velocity was within the normal range.   There was no evidence for stenosis. There was mild regurgitation. - Left atrium: The atrium was severely dilated. - Right ventricle: The cavity size was normal. Wall thickness was   normal. Systolic function was reduced. - Atrial septum: No defect or patent  foramen ovale was identified. - Tricuspid valve: There was trivial regurgitation. - Pulmonary arteries: Systolic pressure was within the normal   range. PA peak pressure: 24 mm Hg (S). - Pericardium, extracardiac: A trivial pericardial effusion was   identified.  Patient Profile     72 y.o. female with a hx of non ischemic cardiomyopathy(cath 0263)ZCH chronic systolic heart failure (EF 30-35% with grade 1 DD) s/p ICD implantation 2016,DM II, hypertension, PAD, and iatrogenic thyroiditis s/p iodine ablationwho is being seen today for the evaluation ofacute on chronic CHF exacerbationand chest pain.   Assessment & Plan    ACUTE ON CHRONIC SYSTOLIC AND DIASTOLIC HF:   Down 3.6 liters since admission.   Weight down 5 lbs.   I will change to PO Lasix today.  Creat is up slightly.  Follow BMET in the AM.   HTN:  BP controlled.     For questions or updates, please contact [place] Please consult [email or web address] for contact info under Cardiology/STEMI.   Signed, Minus Breeding, MD  05/24/2017, 11:22 AM

## 2017-05-24 NOTE — Progress Notes (Signed)
PROGRESS NOTE    Paula Stein  GUY:[id number] DOB: 1945/08/12 DOA: 05/22/2017 PCP: Lucretia Kern, DO   Chief Complaint  Patient presents with  . Chest Pain  . Shortness of Breath    Brief Narrative:  HPI on 05/22/2017 by Dr. Karmen Bongo Paula Stein is a 72 y.o. female with medical history significant of PAD; HTN; DM; AICD placement; and CHF  (4/16 - EF 30-35% with grade 1 diastolic dysfunction) presenting with SOB, chest pain, a little headache front and back.  About 3 weeks ago, her knee just stopped and she just fell; [place].  She has not had further difficulty after the incident.  She became SOB about 2 days ago.  She was no longer able to sleep.  +orthopnea, has been sleeping sitting up.  +PND.  No LE edema.  Slight cough "because I had a little cold last week."  Cough is productive of light-colored, white phlegm.  Interim history Patient admitted for acute on chronic combined heart failure exacerbation.  Cardiology consulted and appreciated.  Assessment & Plan   Acute on chronic combined systolic/diastolic heart failure -Patient presented with worsening shortness of breath and orthopnea. Currently feel breathing has improved.  -chest x-ray reviewed and was consistent with pulmonary edema -BNP 515.6 -Echocardiogram shows an EF of 56-38%, grade 1 diastolic dysfunction (EF April 2016 was 30-35%) -Cardiology consulted and appreciated, pending further recommendations -Continue Lasix 40 mg IV twice daily -Monitor intake and output, daily weights -Urine output over the past 24 hours 2100cc; weight down 5lbs -Continue Entresto, Coreg, spironolactone -patient may enroll in [place] Trial, but needs to be off of IV lasix for 12 hours  Mildly elevated troponin -Troponin 0 0.03, denies current chest pain -Suspect secondary to demand ischemia due to heart failure exacerbation  Essential hypertension -Continue Entresto, Coreg,  hydralazine  Hyperlipidemia  -Continue statin  Diabetes mellitus, type II -Glucophage held -Continue insulin sliding scale CBG monitoring  Hypothyroidism -TSH 0.402, FT4 1.16 -Continue Synthroid -continue to monitor as an outpatient   Chronic kidney disease, stage III -Baseline creatinine 1.0-1.2, currently 1.22 -Continue to monitor BMP closely given the patient will be diuresing  Tobacco dependence -Discussed cessation -Continue nicotine patch  Hypokalemia -Resolved, continue to monitor and replace as needed  DVT Prophylaxis Lovenox  Code Status: Full  Family Communication: None at bedside  Disposition Plan: Admitted, patient currently on IV Lasix, pending further cardiology recommendations.  Suspect discharge to home within the next 24-48 hours.  Consultants Cardiology  Procedures  Echocardiogram  Antibiotics   Anti-infectives (From admission, onward)   None      Subjective:   Paula Stein seen and examined today.  Patient feels her breathing has improved.  Feels her abdominal swelling has improved.  Was able to sleep very well last night.  Currently denies any chest pain, abdominal pain, nausea vomiting, diarrhea or constipation, dizziness or headache.  Objective:   Vitals:   05/23/17 1200 05/23/17 2027 05/24/17 0556 05/24/17 0855  BP: 124/70 124/61 112/78 (!) 133/53  Pulse: 73 79 87 88  Resp: 20 20 18 20   Temp: 98 F (36.7 C) (!) 97.5 F (36.4 C) 98.4 F (36.9 C) 98.4 F (36.9 C)  TempSrc: Oral Oral Oral Oral  SpO2: 96% 100% 100% 99%  Weight:   91.9 kg (202 lb 11.2 oz)   Height:        Intake/Output Summary (Last 24 hours) at 05/24/2017 1023 Last data  filed at 05/24/2017 0940 Gross per 24 hour  Intake 840 ml  Output 2100 ml  Net -1260 ml   Filed Weights   05/22/17 1504 05/23/17 0617 05/24/17 0556  Weight: 94.1 kg (207 lb 8 oz) 92.1 kg (203 lb) 91.9 kg (202 lb 11.2 oz)   Exam  General: Well developed, well nourished, NAD, appears stated  age  70: NCAT, mucous membranes moist.   Neck: Supple  Cardiovascular: S1 S2 auscultated, RRR, no murmur  Respiratory: Clear to auscultation bilaterally with equal chest rise  Abdomen: Soft, nontender, nondistended, + bowel sounds  Extremities: warm dry without cyanosis clubbing or edema  Neuro: AAOx3, nonfocal  Psych: appropriate mood and affect, pleasant  Data Reviewed: I have personally reviewed following labs and imaging studies  CBC: Recent Labs  Lab 05/22/17 0330 05/23/17 0239 05/24/17 0513  WBC 6.9 6.2 7.8  NEUTROABS  --  3.3  --   HGB 12.2 12.3 13.1  HCT 39.1 39.5 41.7  MCV 94.2 93.4 93.3  PLT 350 354 673   Basic Metabolic Panel: Recent Labs  Lab 05/22/17 0330 05/23/17 0239 05/24/17 0513  NA 141 140 141  K 3.8 3.4* 3.8  CL 109 106 106  CO2 22 24 24   GLUCOSE 155* 125* 120*  BUN 14 14 18   CREATININE 1.07* 1.03* 1.22*  CALCIUM 9.1 9.1 9.5   GFR: Estimated Creatinine Clearance: 50.5 mL/min (A) (by C-G formula based on SCr of 1.22 mg/dL (H)). Liver Function Tests: Recent Labs  Lab 05/24/17 0513  AST 15  ALT 11*  ALKPHOS 104  BILITOT 0.6  PROT 7.5  ALBUMIN 3.2*   No results for input(s): LIPASE, AMYLASE in the last 168 hours. No results for input(s): AMMONIA in the last 168 hours. Coagulation Profile: No results for input(s): INR, PROTIME in the last 168 hours. Cardiac Enzymes: Recent Labs  Lab 05/22/17 1220 05/22/17 1450 05/22/17 2018 05/23/17 0239  TROPONINI 0.03* 0.03* 0.03* 0.03*   BNP (last 3 results) No results for input(s): PROBNP in the last 8760 hours. HbA1C: No results for input(s): HGBA1C in the last 72 hours. CBG: Recent Labs  Lab 05/23/17 0813 05/23/17 1150 05/23/17 1626 05/23/17 2158 05/24/17 0731  GLUCAP 123* 103* 136* 112* 115*   Lipid Profile: No results for input(s): CHOL, HDL, LDLCALC, TRIG, CHOLHDL, LDLDIRECT in the last 72 hours. Thyroid Function Tests: Recent Labs    05/22/17 1425  TSH 0.402   FREET4 1.16*   Anemia Panel: No results for input(s): VITAMINB12, FOLATE, FERRITIN, TIBC, IRON, RETICCTPCT in the last 72 hours. Urine analysis:    Component Value Date/Time   COLORURINE YELLOW 01/28/2007 1647   APPEARANCEUR CLOUDY (A) 01/28/2007 1647   LABSPEC 1.025 01/28/2007 1647   PHURINE 5.5 01/28/2007 1647   GLUCOSEU NEGATIVE 01/28/2007 1647   HGBUR MODERATE (A) 01/28/2007 1647   BILIRUBINUR NEGATIVE 01/28/2007 1647   KETONESUR NEGATIVE 01/28/2007 1647   PROTEINUR >300 (A) 01/28/2007 1647   UROBILINOGEN 0.2 01/28/2007 1647   NITRITE POSITIVE (A) 01/28/2007 1647   LEUKOCYTESUR NEGATIVE 01/28/2007 1647   Sepsis Labs: @LABRCNTIP (procalcitonin:4,lacticidven:4)  )No results found for this or any previous visit (from the past 240 hour(s)).    Radiology Studies: No results found.   Scheduled Meds: . aspirin EC  81 mg Oral Daily  . atorvastatin  60 mg Oral q1800  . carvedilol  25 mg Oral BID WC  . enoxaparin (LOVENOX) injection  40 mg Subcutaneous Q24H  . fluticasone  1 spray Each Nare Daily  .  furosemide  40 mg Intravenous BID  . hydrALAZINE  50 mg Oral TID  . isosorbide mononitrate  30 mg Oral Daily  . levothyroxine  175 mcg Oral QAC breakfast  . nicotine  7 mg Transdermal Daily  . sacubitril-valsartan  1 tablet Oral BID  . sodium chloride flush  3 mL Intravenous Q12H  . spironolactone  50 mg Oral Daily   Continuous Infusions: . sodium chloride       LOS: 2 days   Time Spent in minutes   30 minutes  Orhan Mayorga D.O. on 05/24/2017 at 10:23 AM  Between 7am to 7pm - Pager - [phone]  After 7pm go to [email or web address] - password [email or web address]  And look for the night coverage person covering for me after hours  [facility] Office  [phone]

## 2017-05-25 LAB — BASIC METABOLIC PANEL
Anion gap: 12 (ref 5–15)
BUN: 24 mg/dL — ABNORMAL HIGH (ref 6–20)
CALCIUM: 9.3 mg/dL (ref 8.9–10.3)
CO2: 23 mmol/L (ref 22–32)
CREATININE: 1.41 mg/dL — AB (ref 0.44–1.00)
Chloride: 104 mmol/L (ref 101–111)
GFR calc non Af Amer: 36 mL/min — ABNORMAL LOW (ref >60)
GFR, EST AFRICAN AMERICAN: 42 mL/min — AB (ref >60)
GLUCOSE: 113 mg/dL — AB (ref 65–99)
Potassium: 3.6 mmol/L (ref 3.5–5.1)
Sodium: 139 mmol/L (ref 135–145)

## 2017-05-25 LAB — GLUCOSE, CAPILLARY
GLUCOSE-CAPILLARY: 134 mg/dL — AB (ref 65–99)
Glucose-Capillary: 149 mg/dL — ABNORMAL HIGH (ref 65–99)

## 2017-05-25 MED ORDER — ZOLPIDEM TARTRATE 5 MG PO TABS: 5.0000 mg | ORAL_TABLET | Freq: Every evening | ORAL | 0 refills | Status: DC | PRN

## 2017-05-25 MED ORDER — STUDY - GALACTIC STUDY - PLACEBO / OMECAMTIV MECARBIL (PI-MCLEAN)
1.0000 | Freq: Once | Status: AC
  Administered 2017-05-25: 1 via ORAL
  Filled 2017-05-25: qty 1

## 2017-05-25 MED ORDER — SACUBITRIL-VALSARTAN 49-51 MG PO TABS: 1.0000 | ORAL_TABLET | Freq: Two times a day (BID) | ORAL | 10 refills | Status: DC

## 2017-05-25 MED ORDER — NICOTINE 7 MG/24HR TD PT24: 7.0000 mg | MEDICATED_PATCH | Freq: Every day | TRANSDERMAL | 0 refills | Status: DC

## 2017-05-25 MED ORDER — HYDRALAZINE HCL 50 MG PO TABS: 50.0000 mg | ORAL_TABLET | Freq: Three times a day (TID) | ORAL | 0 refills | Status: DC

## 2017-05-25 MED ORDER — STUDY - GALACTIC STUDY - PLACEBO / OMECAMTIV MECARBIL (PI-MCLEAN)
1.0000 | Freq: Two times a day (BID) | Status: DC
  Administered 2017-05-25: 1 via ORAL
  Filled 2017-05-25: qty 1

## 2017-05-25 NOTE — Plan of Care (Signed)
  Progressing Activity: Capacity to carry out activities will improve 05/25/2017 0413 - Progressing by Ardine Eng, RN

## 2017-05-25 NOTE — Progress Notes (Signed)
Patient given discharge instructions and all questions answered.  

## 2017-05-25 NOTE — Research (Signed)
RESEARCH ENCOUNTER  Patient ID: Paula Stein  DOB: 12-16-1945  [name] randomized in the [facility] research study.  No signs/symptoms of ACS since the last visit. Screening and randomization study procedures completed without event.  Education provided on IP.  Subject verbalized understanding.  Patient will follow up with [facility] in 2 weeks.

## 2017-05-25 NOTE — Discharge Summary (Addendum)
Physician Discharge Summary  Paula Stein NOB:[id number] DOB: [birth year] DOA: 05/22/2017  PCP: Lucretia Kern, DO  Admit date: 05/22/2017 Discharge date: 05/25/2017  Time spent: 45 minutes  Recommendations for Outpatient Follow-up:  Patient will be discharged to home.  Patient will need to follow up with primary care provider within one week of discharge, repeat BMP.  Follow-up with Dr. Burt Knack, cardiology.  Patient should continue medications as prescribed.  Patient should follow a heart healthy/carb modified diet.   Discharge Diagnoses:  Acute on chronic combined systolic/diastolic heart failure Mildly elevated troponin Essential hypertension Hyperlipidemia  Diabetes mellitus, type II Hypothyroidism Chronic kidney disease, stage III Tobacco dependence Hypokalemia  Discharge Condition: Stable  Diet recommendation: Heart healthy/carb modified  Filed Weights   05/24/17 0556 05/25/17 0517 05/25/17 0730  Weight: 91.9 kg (202 lb 11.2 oz) 92.1 kg (203 lb) 92.1 kg (203 lb)    History of present illness:  on 05/22/2017 by Dr. Madilyn Hook a 72 y.o.femalewith medical history significant ofPAD; HTN; DM; AICD placement; and CHF (4/16 - EF 30-35% with grade 1 diastolic dysfunction) presenting with SOB, chest pain, a little headache front and back. About 3 weeks ago, her knee just stopped and she just fell; [place] she [place] very [place]. She has not had further difficulty after the incident. She became SOB about 2 days ago. She was no longer able to sleep. +orthopnea, has been sleeping sitting up. +PND. No LE edema. Slight cough "because I had a little cold last week." Cough is productive of light-colored, white phlegm.  Hospital Course:  Acute on chronic combined systolic/diastolic heart failure -Patient presented with worsening shortness of breath and orthopnea. Currently feel breathing has improved.  -chest x-ray reviewed and was  consistent with pulmonary edema -BNP 515.6 -Echocardiogram shows an EF of 94-76%, grade 1 diastolic dysfunction (EF April 2016 was 30-35%) -Cardiology consulted and appreciated, pending further recommendations -transitioned to oral lasix  -Monitor intake and output, daily weights -Continue Entresto, Coreg, spironolactone  -patient enrolled in [place] Trial  Mildly elevated troponin -Troponin 0 0.03, denies current chest pain -Suspect secondary to demand ischemia due to heart failure exacerbation  Essential hypertension -Continue Entresto, Coreg, hydralazine, lasix  Hyperlipidemia  -Continue statin  Diabetes mellitus, type II -Glucophage held-resume as an outpatient   Hypothyroidism -TSH 0.402, FT4 1.16 -Continue Synthroid -continue to monitor as an outpatient   Chronic kidney disease, stage III -Baseline creatinine 1.0-1.2, currently 1.41 -although creatinine mildly elevated, GFR still in stage III -Repeat BMP in one week   Tobacco dependence -Discussed cessation -Continue nicotine patch  Hypokalemia -Resolved, continue to monitor and replace as needed  Insomnia -will give script for [facility]  Consultants Cardiology  Procedures  Echocardiogram  Discharge Exam: Vitals:   05/25/17 0800 05/25/17 1103  BP: 132/78 (!) 101/48  Pulse: 75 75  Resp: 16 18  Temp: (!) 97.5 F (36.4 C) 97.8 F (36.6 C)  SpO2: 97% 94%   Patient seen and examined on day of discharge.  Feels her breathing has improved and back to baseline.  Feels her abdominal swelling has also improved.  Denies any chest pain, abdominal pain, nausea or vomiting, diarrhea or constipation, dizziness or headache.   General: Well developed, well nourished, NAD, appears stated age  HEENT: NCAT, mucous membranes moist.  Cardiovascular: S1 S2 auscultated, no rubs, murmurs or gallops. Regular rate and rhythm.  Respiratory: Clear to auscultation bilaterally with equal chest rise  Abdomen: Soft,  nontender,  nondistended, + bowel sounds  Extremities: warm dry without cyanosis clubbing or edema  Neuro: AAOx3, nonfocal  Psych: Normal affect and demeanor with intact judgement and insight, pleasant   Discharge Instructions Discharge Instructions    Discharge instructions   Complete by:  As directed    Patient will be discharged to home.  Patient will need to follow up with primary care provider within one week of discharge, repeat BMP.  Follow-up with Dr. Burt Knack, cardiology.  Patient should continue medications as prescribed.  Patient should follow a heart healthy/carb modified diet.     Allergies as of 05/25/2017      Reactions   Ibuprofen Nausea Only      Medication List    STOP taking these medications   naproxen sodium 220 MG tablet Commonly known as:  ALEVE     TAKE these medications   aspirin 81 MG tablet Take 81 mg by mouth daily.   atorvastatin 40 MG tablet Commonly known as:  LIPITOR Take 1.5 tablets (60 mg total) by mouth daily.   carvedilol 25 MG tablet Commonly known as:  COREG Take 1 tablet (25 mg total) by mouth 2 (two) times daily with a meal.   furosemide 40 MG tablet Commonly known as:  LASIX Take 1 tablet (40 mg total) by mouth 2 (two) times daily.   glucose blood test strip Commonly known as:  ONETOUCH VERIO Use as instructed to check blood sugar once a day   hydrALAZINE 50 MG tablet Commonly known as:  APRESOLINE Take 1 tablet (50 mg total) by mouth 3 (three) times daily.   isosorbide mononitrate 30 MG 24 hr tablet Commonly known as:  IMDUR take 2 tablet by mouth once daily   levothyroxine 175 MCG tablet Commonly known as:  SYNTHROID Take 1 tablet (175 mcg total) by mouth daily before breakfast.   metFORMIN 1000 MG tablet Commonly known as:  GLUCOPHAGE Take 1,000 mg by mouth every other day.   nicotine 7 mg/24hr patch Commonly known as:  NICODERM CQ - dosed in mg/24 hr Place 1 patch (7 mg total) onto the skin daily. Start taking  on:  09/02/930   [facility] DELICA LANCETS 35T Misc 1 Device by Other route daily.   sacubitril-valsartan 49-51 MG Commonly known as:  ENTRESTO Take 1 tablet by mouth 2 (two) times daily.   spironolactone 50 MG tablet Commonly known as:  ALDACTONE Take 1 tablet (50 mg total) by mouth daily.      Allergies  Allergen Reactions  . Ibuprofen Nausea Only      The results of significant diagnostics from this hospitalization (including imaging, microbiology, ancillary and laboratory) are listed below for reference.    Significant Diagnostic Studies: Dg Chest 2 View  Result Date: 05/22/2017 CLINICAL DATA:  Chest pain EXAM: CHEST  2 VIEW COMPARISON:  11/10/2016, 10/09/2014 FINDINGS: Left-sided pacing device with similar appearance of subcutaneous pacing lead since August 2018 over the upper sternal region, the tip appears slightly more cephalad in position. Cardiomegaly with vascular congestion and diffuse interstitial opacity consistent with pulmonary edema. Small pleural effusions. No pneumothorax. IMPRESSION: 1. Cardiomegaly with vascular congestion and diffuse interstitial opacity consistent with pulmonary edema. Small pleural effusion 2. Slight cephalad migration of the tip of the subcutaneous pacing lead since the prior study. Electronically Signed   By: Donavan Foil M.D.   On: 05/22/2017 04:01    Microbiology: No results found for this or any previous visit (from the past 240 hour(s)).   Labs: Basic  Metabolic Panel: Recent Labs  Lab 05/22/17 0330 05/23/17 0239 05/24/17 0513 05/25/17 0539  NA 141 140 141 139  K 3.8 3.4* 3.8 3.6  CL 109 106 106 104  CO2 22 24 24 23   GLUCOSE 155* 125* 120* 113*  BUN 14 14 18  24*  CREATININE 1.07* 1.03* 1.22* 1.41*  CALCIUM 9.1 9.1 9.5 9.3   Liver Function Tests: Recent Labs  Lab 05/24/17 0513  AST 15  ALT 11*  ALKPHOS 104  BILITOT 0.6  PROT 7.5  ALBUMIN 3.2*   No results for input(s): LIPASE, AMYLASE in the last 168 hours. No  results for input(s): AMMONIA in the last 168 hours. CBC: Recent Labs  Lab 05/22/17 0330 05/23/17 0239 05/24/17 0513  WBC 6.9 6.2 7.8  NEUTROABS  --  3.3  --   HGB 12.2 12.3 13.1  HCT 39.1 39.5 41.7  MCV 94.2 93.4 93.3  PLT 350 354 335   Cardiac Enzymes: Recent Labs  Lab 05/22/17 1220 05/22/17 1450 05/22/17 2018 05/23/17 0239  TROPONINI 0.03* 0.03* 0.03* 0.03*   BNP: BNP (last 3 results) Recent Labs    05/22/17 0330  BNP 515.6*    ProBNP (last 3 results) No results for input(s): PROBNP in the last 8760 hours.  CBG: Recent Labs  Lab 05/24/17 1120 05/24/17 1640 05/24/17 2112 05/25/17 0718 05/25/17 1115  GLUCAP 115* 131* 118* 134* 149*       Signed:  Kaliann Coryell  [facility] 05/25/2017, 1:20 PM

## 2017-05-25 NOTE — Progress Notes (Addendum)
Progress Note  Patient Name: Paula Stein Date of Encounter: 05/25/2017  Primary Cardiologist:   [name], MD   Subjective   No SOB.  Ready to go home.    Inpatient Medications    Scheduled Meds: . aspirin EC  81 mg Oral Daily  . atorvastatin  60 mg Oral q1800  . carvedilol  25 mg Oral BID WC  . enoxaparin (LOVENOX) injection  40 mg Subcutaneous Q24H  . fluticasone  1 spray Each Nare Daily  . furosemide  40 mg Oral BID  . GALACTIC Study - Placebo / omecamtiv mecarbil (PI-[place])  1 tablet Oral BID  . hydrALAZINE  50 mg Oral TID  . isosorbide mononitrate  30 mg Oral Daily  . levothyroxine  175 mcg Oral QAC breakfast  . nicotine  7 mg Transdermal Daily  . sacubitril-valsartan  1 tablet Oral BID  . sodium chloride flush  3 mL Intravenous Q12H  . spironolactone  50 mg Oral Daily  . zolpidem  5 mg Oral Once   Continuous Infusions: . sodium chloride     PRN Meds: sodium chloride, acetaminophen, ondansetron (ZOFRAN) IV, sodium chloride flush, zolpidem   Vital Signs    Vitals:   05/25/17 0730 05/25/17 0733 05/25/17 0800 05/25/17 1103  BP: 129/65 137/62 132/78 (!) 101/48  Pulse: 79 79 75 75  Resp:   16 18  Temp:   (!) 97.5 F (36.4 C) 97.8 F (36.6 C)  TempSrc:   Oral Oral  SpO2: 99% 99% 97% 94%  Weight: 203 lb (92.1 kg)     Height: 5\' 10"  (1.778 m)       Intake/Output Summary (Last 24 hours) at 05/25/2017 1106 Last data filed at 05/25/2017 0859 Gross per 24 hour  Intake 840 ml  Output 600 ml  Net 240 ml   Filed Weights   05/24/17 0556 05/25/17 0517 05/25/17 0730  Weight: 202 lb 11.2 oz (91.9 kg) 203 lb (92.1 kg) 203 lb (92.1 kg)    Telemetry    NSR.  5 beat run of NSVT noted. - Personally Reviewed  ECG    NA - Personally Reviewed  Physical Exam   GEN: No  acute distress.   Neck: No  JVD Cardiac: RRR, no murmurs, rubs, or gallops.  Respiratory: Clear   to auscultation bilaterally. GI: Soft, nontender, non-distended, normal bowel sounds    MS:  No edema; No deformity. Neuro:   Nonfocal  Psych: Oriented and appropriate   Labs    Chemistry Recent Labs  Lab 05/23/17 0239 05/24/17 0513 05/25/17 0539  NA 140 141 139  K 3.4* 3.8 3.6  CL 106 106 104  CO2 24 24 23   GLUCOSE 125* 120* 113*  BUN 14 18 24*  CREATININE 1.03* 1.22* 1.41*  CALCIUM 9.1 9.5 9.3  PROT  --  7.5  --   ALBUMIN  --  3.2*  --   AST  --  15  --   ALT  --  11*  --   ALKPHOS  --  104  --   BILITOT  --  0.6  --   GFRNONAA 53* 43* 36*  GFRAA >60 50* 42*  ANIONGAP 10 11 12      Hematology Recent Labs  Lab 05/22/17 0330 05/23/17 0239 05/24/17 0513  WBC 6.9 6.2 7.8  RBC 4.15 4.23 4.47  HGB 12.2 12.3 13.1  HCT 39.1 39.5 41.7  MCV 94.2 93.4 93.3  MCH 29.4 29.1 29.3  MCHC 31.2 31.1  31.4  RDW 14.1 13.9 14.0  PLT 350 354 335    Cardiac Enzymes Recent Labs  Lab 05/22/17 1220 05/22/17 1450 05/22/17 2018 05/23/17 0239  TROPONINI 0.03* 0.03* 0.03* 0.03*    Recent Labs  Lab 05/22/17 0347  TROPIPOC 0.02     BNP Recent Labs  Lab 05/22/17 0330  BNP 515.6*     DDimer No results for input(s): DDIMER in the last 168 hours.   Radiology    No results found.  Cardiac Studies   ECHO:   05/22/16  Study Conclusions  - Left ventricle: The cavity size was normal. There was severe   concentric hypertrophy. Systolic function was severely reduced.   The estimated ejection fraction was in the range of 25% to 30%.   Diffuse hypokinesis. Doppler parameters are consistent with   abnormal left ventricular relaxation (grade 1 diastolic   dysfunction). Doppler parameters are consistent with   indeterminate ventricular filling pressure. - Aortic valve: Transvalvular velocity was within the normal range.   There was no stenosis. There was no regurgitation. - Mitral valve: Transvalvular velocity was within the normal range.   There was no evidence for stenosis. There was mild regurgitation. - Left atrium: The atrium was severely dilated. -  Right ventricle: The cavity size was normal. Wall thickness was   normal. Systolic function was reduced. - Atrial septum: No defect or patent foramen ovale was identified. - Tricuspid valve: There was trivial regurgitation. - Pulmonary arteries: Systolic pressure was within the normal   range. PA peak pressure: 24 mm Hg (S). - Pericardium, extracardiac: A trivial pericardial effusion was   identified.  Patient Profile     72 y.o. female with a hx of non ischemic cardiomyopathy(cath 1443)XVQ chronic systolic heart failure (EF 30-35% with grade 1 DD) s/p ICD implantation 2016,DM II, hypertension, PAD, and iatrogenic thyroiditis s/p iodine ablationwho is being seen today for the evaluation ofacute on chronic CHF exacerbationand chest pain.   Assessment & Plan    ACUTE ON CHRONIC SYSTOLIC AND DIASTOLIC HF:   Down 3.1 liters since admission.   Weight down about 4 lbs.   I changed to PO Lasix .  Creat continues to creep up.  She has labs scheduled for 2/27 and she knows that this needs to include a BMET.  She can go home on her pervious dose of Lasix.  We will arrange follow up in our office.    HTN:  BP controlled.     For questions or updates, please contact [facility] Please consult [email or web address] for contact info under Cardiology/STEMI.   Signed, Minus Breeding, MD  05/25/2017, 11:06 AM

## 2017-05-28 ENCOUNTER — Other Ambulatory Visit: Payer: Self-pay | Admitting: *Deleted

## 2017-05-28 ENCOUNTER — Telehealth: Payer: Self-pay | Admitting: Family Medicine

## 2017-05-28 DIAGNOSIS — E039 Hypothyroidism, unspecified: Secondary | ICD-10-CM

## 2017-05-28 NOTE — Telephone Encounter (Signed)
I left a voice message for pt to return my call.  

## 2017-05-29 ENCOUNTER — Telehealth: Payer: Self-pay | Admitting: Family Medicine

## 2017-05-29 NOTE — Telephone Encounter (Signed)
Patient asking if she should be taking Aldactone? If yes then she needs refill, also checking on a prior authorization for Ambien, this was recently prescribed during hospital stay.

## 2017-05-29 NOTE — Telephone Encounter (Signed)
Please call patient, I am so sorry to hear that she was recently in the hospital.  I would recommend that she discuss the Aldactone with her cardiologist.  It looks like the discharge documents do recommend to continue that medication. I do not recommend Ambien for sleep.  It has very high risks.  We can discuss other options for sleep at her office visit.

## 2017-05-29 NOTE — Telephone Encounter (Signed)
Transition Care Management Follow-up Telephone Call  NYALA KIRCHNER XBJ:[id number] DOB: 1945/08/12 DOA: 05/22/2017  PCP: Lucretia Kern, DO  Admit date: 05/22/2017 Discharge date: 05/25/2017  Time spent: 45 minutes  Recommendations for Outpatient Follow-up:  Patient will be discharged to home.  Patient will need to follow up with primary care provider within one week of discharge, repeat BMP.  Follow-up with Dr. Burt Knack, cardiology.  Patient should continue medications as prescribed.  Patient should follow a heart healthy/carb modified diet.   Discharge Diagnoses:  Acute on chronic combined systolic/diastolic heart failure Mildly elevated troponin Essential hypertension Hyperlipidemia  Diabetes mellitus, type II Hypothyroidism Chronic kidney disease, stage III Tobacco dependence Hypokalemia   How have you been since you were released from the hospital? "better"   Do you understand why you were in the hospital? yes   Do you understand the discharge instructions? yes   Where were you discharged to? Home   Items Reviewed:  Medications reviewed: yes  Allergies reviewed: yes  Dietary changes reviewed: yes  Referrals reviewed: yes   Functional Questionnaire:   Activities of Daily Living (ADLs):   She states they are independent in the following: ambulation, bathing and hygiene, feeding, continence, grooming, toileting and dressing States they require assistance with the following: none   Any transportation issues/concerns?: no   Any patient concerns? no   Confirmed importance and date/time of follow-up visits scheduled yes  Provider Appointment booked with Dr. Maudie Mercury on 06/07/2017 Thursday at 2:30 pm  Confirmed with patient if condition begins to worsen call PCP or go to the ER.  Patient was given the office number and encouraged to call back with question or concerns.  : yes

## 2017-05-29 NOTE — Telephone Encounter (Signed)
Patient asked if she needed to come tomorrow for the lab appt to check her thyroid. I cancelled the appt as a TSH test was done in the hospital on 2/19 and message sent to Dr Maudie Mercury.

## 2017-05-29 NOTE — Telephone Encounter (Signed)
I called the pt and informed her of the message below and she agreed to call the cardiologist's office.

## 2017-05-30 ENCOUNTER — Ambulatory Visit (INDEPENDENT_AMBULATORY_CARE_PROVIDER_SITE_OTHER): Payer: Medicare Other | Admitting: *Deleted

## 2017-05-30 ENCOUNTER — Other Ambulatory Visit: Payer: Medicare Other

## 2017-05-30 DIAGNOSIS — I428 Other cardiomyopathies: Secondary | ICD-10-CM | POA: Diagnosis not present

## 2017-05-30 NOTE — Progress Notes (Signed)
Remote ICD transmission.   

## 2017-05-31 ENCOUNTER — Encounter: Payer: Self-pay | Admitting: Cardiology

## 2017-06-04 ENCOUNTER — Ambulatory Visit: Payer: Medicare Other | Admitting: Adult Health

## 2017-06-06 ENCOUNTER — Encounter: Payer: Medicare Other | Admitting: *Deleted

## 2017-06-06 VITALS — BP 153/61 | HR 79 | Wt 210.0 lb

## 2017-06-06 DIAGNOSIS — Z006 Encounter for examination for normal comparison and control in clinical research program: Secondary | ICD-10-CM

## 2017-06-06 NOTE — Progress Notes (Signed)
RESEARCH ENCOUNTER  Patient ID: Paula Stein  DOB: 10/31/1945  [name] presented to the [facility] for the Week 2 visit of the [facility].  No signs/symptoms of ACS since the last visit. Subject <80% compliant with medications.  Subject states she has forgotten to take a couple pills.  Re-educated subject on dosage compliance.  Subject verbalized understanding.  Week 2 study requirements completed without event.  Patient will follow up with [facility] in 2 weeks.

## 2017-06-07 ENCOUNTER — Encounter: Payer: Self-pay | Admitting: Family Medicine

## 2017-06-07 ENCOUNTER — Ambulatory Visit: Payer: Medicare Other | Admitting: Family Medicine

## 2017-06-07 VITALS — BP 118/50 | HR 88 | Temp 97.8°F | Ht 70.0 in | Wt 209.9 lb

## 2017-06-07 DIAGNOSIS — I5043 Acute on chronic combined systolic (congestive) and diastolic (congestive) heart failure: Secondary | ICD-10-CM

## 2017-06-07 DIAGNOSIS — Z720 Tobacco use: Secondary | ICD-10-CM

## 2017-06-07 DIAGNOSIS — E039 Hypothyroidism, unspecified: Secondary | ICD-10-CM

## 2017-06-07 DIAGNOSIS — E1142 Type 2 diabetes mellitus with diabetic polyneuropathy: Secondary | ICD-10-CM | POA: Diagnosis not present

## 2017-06-07 DIAGNOSIS — N189 Chronic kidney disease, unspecified: Secondary | ICD-10-CM | POA: Diagnosis not present

## 2017-06-07 DIAGNOSIS — E785 Hyperlipidemia, unspecified: Secondary | ICD-10-CM | POA: Diagnosis not present

## 2017-06-07 DIAGNOSIS — E1169 Type 2 diabetes mellitus with other specified complication: Secondary | ICD-10-CM

## 2017-06-07 LAB — BASIC METABOLIC PANEL
BUN: 18 mg/dL (ref 6–23)
CALCIUM: 9.4 mg/dL (ref 8.4–10.5)
CO2: 27 mEq/L (ref 19–32)
Chloride: 106 mEq/L (ref 96–112)
Creatinine, Ser: 1.17 mg/dL (ref 0.40–1.20)
GFR: 58.18 mL/min — AB (ref >60.00)
GLUCOSE: 149 mg/dL — AB (ref 70–99)
Potassium: 3.6 mEq/L (ref 3.5–5.1)
Sodium: 141 mEq/L (ref 135–145)

## 2017-06-07 NOTE — Patient Instructions (Addendum)
BEFORE YOU LEAVE: -labs for kidney check -follow up: in June with Dr. Maudie Mercury when she sees Manuela Schwartz for AWV  Congratulations on quitting smoking!  See your cardiologist about follow-up for the heart.  Consider therapy for sleep -call number on the brochure provided. No screen time for 2 hours before bed. Keep bedroom cool, dark and quiet. Try going to bed a little bit early.  Try going to bed at the same time every night and getting up at the same time every morning. Start using the CPAP.  I hope you are getting more sleep soon!   We recommend the following healthy lifestyle for LIFE: 1) Small portions. But, make sure to get regular (at least 3 per day), healthy meals and small healthy snacks if needed.  2) Eat a healthy clean diet.   TRY TO EAT: -at least 5-7 servings of low sugar, colorful, and nutrient rich vegetables per day (not corn, potatoes or bananas.) -berries are the best choice if you wish to eat fruit (only eat small amounts if trying to reduce weight)  -lean meets (fish, white meat of chicken or [place]) -vegan proteins for some meals - beans or tofu, whole grains, nuts and seeds -Replace bad fats with good fats - good fats include: fish, nuts and seeds, canola oil, olive oil -small amounts of low fat or non fat dairy -small amounts of100 % whole grains - check the lables -drink plenty of water  AVOID: -SUGAR, sweets, anything with added sugar, corn syrup or sweeteners - must read labels as even foods advertised as "healthy" often are loaded with sugar -if you must have a sweetener, small amounts of stevia may be best -sweetened beverages and artificially sweetened beverages -simple starches (rice, bread, potatoes, pasta, chips, etc - small amounts of 100% whole grains are ok) -red meat, pork, butter -fried foods, fast food, processed food, excessive dairy, eggs and coconut.  3)Get at least 150 minutes of sweaty aerobic exercise per week.  4)Reduce stress - consider  counseling, meditation and relaxation to balance other aspects of your life.

## 2017-06-07 NOTE — Progress Notes (Signed)
HPI:  Using dictation device. Unfortunately this device frequently misinterprets words/phrases.  Medicare wellness visit was 08/31/16 Due for kidney recheck  Paula Stein is a pleasant 72 y.o. with a PMH significant for hypertension, diabetes, CHF, peripheral arterial disease, AICD (sees cardiology for management )here for a hospital follow up. See transitional care phone note in Epic. Per review of discharge documents and patient: Hospitalized 05/22/2017 to 05/25/2017 Primary admitting complaint(s): Shortness of breath, cough Primary admitting diagnosis (es) and treatment: Acute on chronic heart failure, EF 25-30%, she is to follow-up with cardiology about this Other significant diagnosis (es) and treatment: Mild bump in creatinine, chronic kidney disease; hypokalemia-resolved per discharge document; poor sleep, patient was provided Ambien in the hospital without any other recommendations or sleep treatments Reports today: She is feeling great, denies any chest pain, shortness of breath, swelling, low blood sugars She has some trouble with sleep.  She stays up to about 11 watching TV and working on the computer, then she has some difficulty falling asleep.  She sleeps well until about 3 or 330, then wakes up feeling rested.  She usually gets up that and does a little housework and then goes back to sleep.  Sleep problems started just recently with heart issues.  She has not been using her CPAP.  She reports in the past this helped her well.  She just got her equipment and plans to start using it tonight again.  He tried melatonin for sleep which did not help.  Denies any depression or anxiety. She is so excited to report that she quit smoking 3 weeks ago.  She has had some cravings occasionally. TSH in normal range in hospital, lipids checked in January, diabetes labs checked in January.   ROS: See pertinent positives and negatives per HPI.  Past Medical History:  Diagnosis Date  . AICD  (automatic cardioverter/defibrillator) present 10/08/2014   SUBQ    /    DR [name]  . CHF (congestive heart failure) ([facility])   . Diabetes mellitus without complication ([facility])   . History of cardiac cath 05/2007   normal-with patent coronaries  . History of colonoscopy   . History of hiatal hernia   . Hypertension   . Iatrogenic thyroiditis   . Non-ischemic cardiomyopathy ([facility])    EF 28%- reassessment of LV function 2011 with LVEf 45-50%  . PAD (peripheral artery disease) (HCC)    lower extremities with ABIs of 0.5 bilaterally  . Personal history of goiter   . S/P radioactive iodine thyroid ablation   . S/P thyroidectomy   . Shortness of breath dyspnea     Past Surgical History:  Procedure Laterality Date  . EP IMPLANTABLE DEVICE N/A 10/08/2014   Procedure: SubQ ICD Implant;  Surgeon: [name], MD;  Location: [facility];  Service: Cardiovascular;  Laterality: N/A;  . THYROIDECTOMY    . TONSILLECTOMY    . TOTAL ABDOMINAL HYSTERECTOMY      Family History  Problem Relation Age of Onset  . Diabetes type II Mother   . Hypertension Mother   . Diabetes Mother   . Heart disease Mother   . Other Father        deceased from accident age 57  . Hyperlipidemia Father     Social History   Socioeconomic History  . Marital status: Divorced    Spouse name: None  . Number of children: 1  . Years of education: None  . Highest education level: None  Social  Needs  . Financial resource strain: None  . Food insecurity - worry: None  . Food insecurity - inability: None  . Transportation needs - medical: None  . Transportation needs - non-medical: None  Occupational History  . Occupation: Retired    Fish farm manager: RETIRED    CommentAdvertising copywriter  Tobacco Use  . Smoking status: Current Some Day Smoker    Packs/day: 0.25    Years: 40.00    Pack years: 10.00    Types: Cigarettes  . Smokeless tobacco: Never Used  . Tobacco comment: "This was the final straw"  Substance and  Sexual Activity  . Alcohol use: Yes    Alcohol/week: 0.0 oz    Comment: rare glass of wine  . Drug use: No  . Sexual activity: Not Currently  Other Topics Concern  . None  Social History Narrative   Retired Education officer, museum   Divorced - one grown son   current smoker    Alcohol use-no      Drug use-no    Regular exercise- no     son Markita Stcharles     Current Outpatient Medications:  .  aspirin 81 MG tablet, Take 81 mg by mouth daily.  , Disp: , Rfl:  .  atorvastatin (LIPITOR) 40 MG tablet, Take 1.5 tablets (60 mg total) by mouth daily., Disp: 135 tablet, Rfl: 3 .  carvedilol (COREG) 25 MG tablet, Take 1 tablet (25 mg total) by mouth 2 (two) times daily with a meal., Disp: 180 tablet, Rfl: 3 .  furosemide (LASIX) 40 MG tablet, Take 1 tablet (40 mg total) by mouth 2 (two) times daily., Disp: 180 tablet, Rfl: 3 .  glucose blood (ONETOUCH VERIO) test strip, Use as instructed to check blood sugar once a day, Disp: 100 each, Rfl: 3 .  hydrALAZINE (APRESOLINE) 50 MG tablet, Take 1 tablet (50 mg total) by mouth 3 (three) times daily., Disp: 90 tablet, Rfl: 0 .  isosorbide mononitrate (IMDUR) 30 MG 24 hr tablet, take 2 tablet by mouth once daily, Disp: 180 tablet, Rfl: 3 .  levothyroxine (SYNTHROID) 175 MCG tablet, Take 1 tablet (175 mcg total) by mouth daily before breakfast., Disp: 90 tablet, Rfl: 1 .  metFORMIN (GLUCOPHAGE) 1000 MG tablet, Take 1,000 mg by mouth every other day., Disp: , Rfl:  .  nicotine (NICODERM CQ - DOSED IN MG/24 HR) 7 mg/24hr patch, Place 1 patch (7 mg total) onto the skin daily., Disp: 28 patch, Rfl: 0 .  ONETOUCH DELICA LANCETS 35K MISC, 1 Device by Other route daily., Disp: 100 each, Rfl: 3 .  sacubitril-valsartan (ENTRESTO) 49-51 MG, Take 1 tablet by mouth 2 (two) times daily., Disp: 60 tablet, Rfl: 10 .  spironolactone (ALDACTONE) 50 MG tablet, Take 1 tablet (50 mg total) by mouth daily., Disp: 30 tablet, Rfl: 11 .  zolpidem (AMBIEN) 5 MG tablet, Take 1 tablet (5  mg total) by mouth at bedtime as needed for sleep., Disp: 30 tablet, Rfl: 0  EXAM:  Vitals:   06/07/17 1430  BP: (!) 118/50  Pulse: 88  Temp: 97.8 F (36.6 C)  SpO2: 98%    Body mass index is 30.12 kg/m.  GENERAL: vitals reviewed and listed above, alert, oriented, appears well hydrated and in no acute distress  HEENT: atraumatic, conjunttiva clear, no obvious abnormalities on inspection of external nose and ears  NECK: no obvious masses on inspection  LUNGS: clear to auscultation bilaterally, no wheezes, rales or rhonchi, good air movement  CV:  HRRR, no peripheral edema  MS: moves all extremities without noticeable abnormality  PSYCH: pleasant and cooperative, no obvious depression or anxiety  ASSESSMENT AND PLAN:  Discussed the following assessment and plan:  Acute on chronic combined systolic (congestive) and diastolic (congestive) heart failure (HCC)  Chronic kidney disease, unspecified CKD stage - Plan: Basic metabolic panel  Type 2 diabetes mellitus with diabetic polyneuropathy, without long-term current use of insulin (HCC)  Hypothyroidism, unspecified type  Hyperlipidemia associated with type 2 diabetes mellitus ([place])  Tobacco use  I am so glad that she quit smoking, congratulated her -we talked about various options for managing cravings, but this may be contributing some to the sleep issues We discussed treatment options for poor sleep, starting with sleep hygiene.  Cognitive behavioral therapy has been shown to be most beneficial for sleep and I did provide her with a bursar so that she can schedule this if she wishes.  I do advise against regular or long-term use of medicine such as Ambien or benzodiazepines given the significant risks.  She is agreeable to trying these other options. He is a healthy low sugar diet.  She has follow-up with her cardiologist soon.  Recommend regular aerobic exercise if able and cardiology approves. We will plan to check her  fasting lab work and follow-up in June when she has her annual wellness visit. Check BMP today -Patient advised to return or notify a doctor immediately if symptoms worsen or persist or new concerns arise.  Patient Instructions  BEFORE YOU LEAVE: -labs for kidney check -follow up: in June with Dr. Maudie Mercury when she sees Manuela Schwartz for AWV  Congratulations on quitting smoking!  See your cardiologist about follow-up for the heart.  Consider therapy for sleep -call number on pressure provided. No screen time for 2 hours before bed. Keep bedroom cool, dark and quiet. Try going to bed a little bit early.  Try going to bed at the same time every night and getting up at the same time every morning. Start using the CPAP.  I hope you are getting more sleep soon!   We recommend the following healthy lifestyle for LIFE: 1) Small portions. But, make sure to get regular (at least 3 per day), healthy meals and small healthy snacks if needed.  2) Eat a healthy clean diet.   TRY TO EAT: -at least 5-7 servings of low sugar, colorful, and nutrient rich vegetables per day (not corn, potatoes or bananas.) -berries are the best choice if you wish to eat fruit (only eat small amounts if trying to reduce weight)  -lean meets (fish, white meat of chicken or [place]) -vegan proteins for some meals - beans or tofu, whole grains, nuts and seeds -Replace bad fats with good fats - good fats include: fish, nuts and seeds, canola oil, olive oil -small amounts of low fat or non fat dairy -small amounts of100 % whole grains - check the lables -drink plenty of water  AVOID: -SUGAR, sweets, anything with added sugar, corn syrup or sweeteners - must read labels as even foods advertised as "healthy" often are loaded with sugar -if you must have a sweetener, small amounts of stevia may be best -sweetened beverages and artificially sweetened beverages -simple starches (rice, bread, potatoes, pasta, chips, etc - small amounts  of 100% whole grains are ok) -red meat, pork, butter -fried foods, fast food, processed food, excessive dairy, eggs and coconut.  3)Get at least 150 minutes of sweaty aerobic exercise per week.  4)Reduce stress -  consider counseling, meditation and relaxation to balance other aspects of your life.     Lucretia Kern, DO

## 2017-06-13 ENCOUNTER — Ambulatory Visit: Payer: Medicare Other | Admitting: Adult Health

## 2017-06-15 LAB — CUP PACEART REMOTE DEVICE CHECK
Battery Remaining Percentage: 70 %
Implantable Lead Implant Date: 20160707
Implantable Lead Model: 3401
Implantable Pulse Generator Implant Date: 20160707
MDC IDC LEAD LOCATION: 753858
MDC IDC SESS DTM: 20190227120500
Pulse Gen Serial Number: 115852

## 2017-06-18 ENCOUNTER — Ambulatory Visit: Payer: Medicare Other | Admitting: Physician Assistant

## 2017-06-18 DIAGNOSIS — I739 Peripheral vascular disease, unspecified: Secondary | ICD-10-CM | POA: Insufficient documentation

## 2017-06-18 DIAGNOSIS — Z95810 Presence of automatic (implantable) cardiac defibrillator: Secondary | ICD-10-CM | POA: Insufficient documentation

## 2017-06-18 DIAGNOSIS — R0989 Other specified symptoms and signs involving the circulatory and respiratory systems: Secondary | ICD-10-CM

## 2017-06-18 DIAGNOSIS — I2510 Atherosclerotic heart disease of native coronary artery without angina pectoris: Secondary | ICD-10-CM | POA: Insufficient documentation

## 2017-06-18 NOTE — Progress Notes (Deleted)
Cardiology Office Note:    Date:  06/18/2017   ID:  DEVERY ODWYER, DOB 08/31/1945, MRN [id number]  PCP:  Lucretia Kern, DO  Cardiologist:  [name], MD  Electrophysiologist: Dr. Virl Axe     Referring MD: Lucretia Kern, DO   No chief complaint on file. ***  History of Present Illness:    Paula Stein is a 72 y.o. 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  Last seen by Dr. Burt Knack in March 2017.  The patient was admitted 2/19-2/22 with decompensated heart failure.  She was followed by cardiology.  Echocardiogram demonstrated EF 25-30 with mild diastolic dysfunction and reduced RV systolic function.  Troponin levels were minimally elevated without clear trend (0.03, 0.03, 0.03, 0.03).  This was likely related to demand ischemia and no further workup was recommended.  Paula Stein returns for posthospitalization follow-up.***  Prior CV studies:   The following studies were reviewed today:  Echo 05/22/17 Severe concentric LVH, EF 25-00, grade 1 diastolic dysfunction, mild MR, severe LAE, reduced RVSF, PASP 24, trivial pericardial effusion  Echo 07/22/14 Moderate concentric LVH, EF 30-35, diffuse HK, grade 1 diastolic dysfunction, mild MR, moderate to severe LAE, moderately reduced RVSF  ABIs/18/16 R 0.58; L 0.58  Echo 12/03/12 Severe LVH, EF 30-35, diffuse HK, moderate MR, mild to moderate LAE, PASP 42  Echo 07/11/12 Severe LVH, EF 25, grade 1 diastolic dysfunction, moderate MR, moderate LAE, trivial pericardial effusion  Cardiac catheterization 05/13/07 LAD proximal 30,, mid 20; Dx ostial 50 RCA mid 30 EF 35  Past Medical History:  Diagnosis Date  . AICD (automatic cardioverter/defibrillator) present 10/08/2014   SUBQ    /    DR [name]  . CHF  (congestive heart failure) ([place])   . Diabetes mellitus without complication ([place])   . History of cardiac cath 05/2007   normal-with patent coronaries  . History of colonoscopy   . History of hiatal hernia   . Hypertension   . Iatrogenic thyroiditis   . Non-ischemic cardiomyopathy ([place])    EF 28%- reassessment of LV function 2011 with LVEf 45-50%  . PAD (peripheral artery disease) (HCC)    lower extremities with ABIs of 0.5 bilaterally  . Personal history of goiter   . S/P radioactive iodine thyroid ablation   . S/P thyroidectomy   . Shortness of breath dyspnea    Surgical Hx: The patient  has a past surgical history that includes Total abdominal hysterectomy; Thyroidectomy; Tonsillectomy; and Cardiac catheterization (N/A, 10/08/2014).   Current Medications: No outpatient medications have been marked as taking for the 06/18/17 encounter (Appointment) with Richardson Dopp T, PA-C.     Allergies:   Ibuprofen   Social History   Tobacco Use  . Smoking status: Current Some Day Smoker    Packs/day: 0.25    Years: 40.00    Pack years: 10.00    Types: Cigarettes  . Smokeless tobacco: Never Used  . Tobacco comment: "This was the final straw"  Substance Use Topics  . Alcohol use: Yes    Alcohol/week: 0.0 oz    Comment: rare glass of wine  . Drug use: No     Family Hx: The patient's family history includes Diabetes in her mother; Diabetes type II in her mother; Heart disease in her mother; Hyperlipidemia in her father; Hypertension in her mother; Other in  her father.  ROS:   Please see the history of present illness.    ROS All other systems reviewed and are negative.   EKGs/Labs/Other Test Reviewed:    EKG:  EKG is *** ordered today.  The ekg ordered today demonstrates ***  Recent Labs: 05/22/2017: B Natriuretic Peptide 515.6; TSH 0.402 05/24/2017: ALT 11; Hemoglobin 13.1; Platelets 335 06/07/2017: BUN 18; Creatinine, Ser 1.17; Potassium 3.6; Sodium 141   Recent Lipid  Panel Lab Results  Component Value Date/Time   CHOL 234 (H) 04/13/2017 09:18 AM   TRIG 122.0 04/13/2017 09:18 AM   HDL 67.00 04/13/2017 09:18 AM   CHOLHDL 3 04/13/2017 09:18 AM   LDLCALC 142 (H) 04/13/2017 09:18 AM   LDLDIRECT 195.2 02/15/2011 04:25 PM    Physical Exam:    VS:  There were no vitals taken for this visit.    Wt Readings from Last 3 Encounters:  06/07/17 209 lb 14.4 oz (95.2 kg)  06/06/17 210 lb (95.3 kg)  05/25/17 203 lb (92.1 kg)     ***Physical Exam  ASSESSMENT & PLAN:    Chronic combined systolic and diastolic CHF (congestive heart failure) (HCC)  Coronary artery disease involving native coronary artery of native heart without angina pectoris  NICM (nonischemic cardiomyopathy) ([place])  Hyperlipidemia associated with type 2 diabetes mellitus ([place])  Essential hypertension  ICD (implantable cardioverter-defibrillator) in place  PAD (peripheral artery disease) ([place])***  Dispo:  No Follow-up on file.   Medication Adjustments/Labs and Tests Ordered: Current medicines are reviewed at length with the patient today.  Concerns regarding medicines are outlined above.  Tests Ordered: No orders of the defined types were placed in this encounter.  Medication Changes: No orders of the defined types were placed in this encounter.   Signed, Richardson Dopp, PA-C  06/18/2017 8:14 AM    [facility] [place], [place], [place]  [address] Phone: [phone]; Fax: [phone]

## 2017-06-21 ENCOUNTER — Encounter: Payer: Self-pay | Admitting: Physician Assistant

## 2017-06-21 ENCOUNTER — Encounter: Payer: Medicare Other | Admitting: *Deleted

## 2017-06-21 VITALS — BP 134/61 | HR 78 | Wt 212.8 lb

## 2017-06-21 DIAGNOSIS — Z006 Encounter for examination for normal comparison and control in clinical research program: Secondary | ICD-10-CM

## 2017-06-21 NOTE — Progress Notes (Signed)
RESEARCH ENCOUNTER  Patient ID: Paula Stein  DOB: 15-Mar-1946  Paula Stein presented to the [facility] for the Week 4 visit of the [facility].  No signs/symptoms of ACS since the last visit. Subject compliant with IP, IP returned, and additional IP dispensed.     Congratulated subject on stopping smoking.  Also, subject states that she has been sleeping well since she received a new CPAP machine.    [facility] will [facility] with [facility] in [facility].

## 2017-06-26 ENCOUNTER — Other Ambulatory Visit: Payer: Self-pay | Admitting: Family Medicine

## 2017-06-26 DIAGNOSIS — I10 Essential (primary) hypertension: Secondary | ICD-10-CM

## 2017-06-26 DIAGNOSIS — E7849 Other hyperlipidemia: Secondary | ICD-10-CM

## 2017-06-26 NOTE — Telephone Encounter (Signed)
Copied from [place] [phone]. Topic: Quick Communication - Rx Refill/Question >> Jun 26, 2017  5:16 PM Oliver Pila B wrote: Medication: atorvastatin (LIPITOR) 40 MG tablet [[id number]] , hydrALAZINE (APRESOLINE) 50 MG tablet [[id number]]  Has the patient contacted their pharmacy? Yes.   (Agent: If no, request that the patient contact the pharmacy for the refill.) Preferred Pharmacy (with phone number or street name): [facility] Agent: Please be advised that RX refills may take up to 3 business days. We ask that you follow-up with your pharmacy.

## 2017-06-27 NOTE — Telephone Encounter (Signed)
Patient is calling back and states she also needs a refill on furosemide (LASIX) 40 MG tablet  please advise.

## 2017-06-27 NOTE — Telephone Encounter (Signed)
Patient is requesting refills on drugs that are from historical provider: Furosemide 40 mg                                                                                                                Hydralazine 50 mg  Atorvastatin 40 mg- follow due 6/19   LOV: 06/07/17  PCP: [place]: verified

## 2017-06-28 MED ORDER — ATORVASTATIN CALCIUM 40 MG PO TABS: 60.0000 mg | ORAL_TABLET | Freq: Every day | ORAL | 3 refills | Status: DC

## 2017-06-28 NOTE — Telephone Encounter (Signed)
Refills were sent for Atorvastatin.  I called [facility] and spoke with Jeanine and she stated refills are ready for Hydralazine from Dr Laural Roes.  I asked if she could send the request to the cardiologist office-Dr Burt Knack at [facility] for refill on Furosemide as these were not given by Dr Maudie Mercury and she agreed to do so.  I called the pt and informed her of this and asked that she call back if she has any problems.

## 2017-07-03 ENCOUNTER — Encounter: Payer: Medicare Other | Admitting: *Deleted

## 2017-07-03 VITALS — BP 163/85 | HR 75 | Wt 210.9 lb

## 2017-07-03 DIAGNOSIS — Z006 Encounter for examination for normal comparison and control in clinical research program: Secondary | ICD-10-CM

## 2017-07-03 NOTE — Progress Notes (Signed)
RESEARCH ENCOUNTER  Patient ID: EISA CONAWAY  DOB: [birth year]  [name] presented to the [facility] for the Week 6 visit of the [facility].  No signs/symptoms of ACS since the last visit. BP elevated at time of visit; asymptomatic.  Multiple attempts to obtain lower BP following study protocol.  Subject stated that she did not take her morning medications.  Instructed subject to return home and take morning medications and check BP at home later today.  Provided education on taking all medications, with the exception of study IP, prior to all [facility] appts.  Subject verbalized understanding.    Patient will follow up with [facility] in 2 weeks.

## 2017-07-09 ENCOUNTER — Other Ambulatory Visit: Payer: Self-pay | Admitting: Internal Medicine

## 2017-07-09 DIAGNOSIS — I10 Essential (primary) hypertension: Secondary | ICD-10-CM

## 2017-07-09 MED ORDER — FUROSEMIDE 40 MG PO TABS: 40.0000 mg | ORAL_TABLET | Freq: Two times a day (BID) | ORAL | 1 refills | Status: DC

## 2017-07-09 NOTE — Telephone Encounter (Signed)
Pt's medication was sent to pt's pharmacy as requested. Confirmation received.  °

## 2017-07-17 ENCOUNTER — Encounter: Payer: Medicare Other | Admitting: *Deleted

## 2017-07-17 VITALS — BP 171/60 | HR 91 | Wt 203.8 lb

## 2017-07-17 NOTE — Progress Notes (Signed)
RESEARCH ENCOUNTER  Patient ID: Paula Stein  DOB: [birth year]  [name] presented to the [facility] for the Week 8 visit of the [facility].  No signs/symptoms of ACS since the last visit. Subject compliant with IP, IP returned, and additional IP dispensed.  Subject BP elevated. Discussed scheduling appointment with cardiologist to titrate medication and have follow up.  Subject verbalized understanding.  Patient will follow up with [facility] in 4 weeks.

## 2017-07-20 ENCOUNTER — Ambulatory Visit: Payer: Medicare Other | Admitting: Physician Assistant

## 2017-07-23 ENCOUNTER — Encounter: Payer: Self-pay | Admitting: Pulmonary Disease

## 2017-07-23 NOTE — Telephone Encounter (Signed)
Spoke with patient. She stated that she was confused about the appt that has been scheduled for her on May 6th with 915am with TP. Advised patient that TP works with Dr. Halford Chessman and Dr. Halford Chessman did not have any openings that week.   She verbalized understanding and wishes to keep the appointment with TP. Nothing else needed at time of call.

## 2017-08-06 ENCOUNTER — Ambulatory Visit (INDEPENDENT_AMBULATORY_CARE_PROVIDER_SITE_OTHER): Payer: Medicare Other | Admitting: Adult Health

## 2017-08-06 ENCOUNTER — Encounter: Payer: Self-pay | Admitting: Adult Health

## 2017-08-06 VITALS — BP 142/70 | HR 95 | Ht 70.0 in | Wt 202.0 lb

## 2017-08-06 DIAGNOSIS — G4733 Obstructive sleep apnea (adult) (pediatric): Secondary | ICD-10-CM

## 2017-08-06 DIAGNOSIS — F17210 Nicotine dependence, cigarettes, uncomplicated: Secondary | ICD-10-CM

## 2017-08-06 DIAGNOSIS — G4731 Primary central sleep apnea: Secondary | ICD-10-CM

## 2017-08-06 NOTE — Progress Notes (Signed)
Reviewed and agree with assessment/plan.   Anikah Hogge, MD [facility] 03/29/2016, 12:24 PM Pager:  [phone]  

## 2017-08-06 NOTE — Assessment & Plan Note (Signed)
Smoking cessation discussed 

## 2017-08-06 NOTE — Assessment & Plan Note (Signed)
Improved control on BiPAP.  Continue on current regimen Order for new facemask  Plan  Patient Instructions  Keep up good work , continue on BIPAP At bedtime   Work on healthy weight .  Order for dream wear full face .  Do not drive if sleepy .  Follow up with Dr. Halford Chessman in 4 months and As needed

## 2017-08-06 NOTE — Patient Instructions (Signed)
Keep up good work , continue on BIPAP At bedtime   Work on healthy weight .  Order for dream wear full face .  Do not drive if sleepy .  Follow up with Dr. Halford Chessman in 4 months and As needed

## 2017-08-06 NOTE — Progress Notes (Signed)
@Patient  ID: Paula Stein, female    DOB: 27-Nov-1945, 72 y.o.   MRN: [id number]  Chief Complaint  Patient presents with  . Follow-up    OSA    Referring provider: Lucretia Kern, DO  HPI: 72 year old female followed for complex sleep apnea Past medical history significant for congestive heart failure  TEST  Sleep tests PSG 09/11/12 >> AHI 68.6 PSG 09/13/16 >> AHI 50.8, SpO2 low 86%.  Treatment emergent centrals.  Bipap 18/14 cm H2O  08/06/2017 Follow up; OSA  Presents for a follow-up for sleep apnea.  Patient was recently changed over to BiPAP recently.  She says that she is doing better on BiPAP.  Feels much more rested.  Does complain that the full face mask moves at night and is irritating the top of her nose.  Download shows excellent compliance with average usage at 6.5 hours.  Patient is on IPAP 18, EPAP 14 centers H2O.  AHI 6.1.  Positive leaks.  Central events were 3.3 obstructive events were 0.9 and unknown was 1.6 We looked at various other mask.  She would like to try a new dream wear full facemask.  Cardiac tests Echo 07/22/14 >> EF 30 to 35%, mod LVH, grade 1 DD, mild MR, mod RV systolic dysfx    Allergies  Allergen Reactions  . Ibuprofen Nausea Only    Immunization History  Administered Date(s) Administered  . Influenza Split 02/15/2011  . Influenza Whole 08/22/2004, 02/18/2007, 12/31/2008, 02/17/2010, 01/02/2012  . Influenza, High Dose Seasonal PF 01/04/2015, 12/11/2016  . Influenza,inj,Quad PF,6+ Mos 06/26/2013, 02/03/2014  . Influenza-Unspecified 02/02/2016  . Pneumococcal Conjugate-13 06/26/2013  . Pneumococcal Polysaccharide-23 08/22/2004, 02/03/2014  . Td 12/22/2002  . Tdap 06/26/2013  . Zoster 07/24/2014    Past Medical History:  Diagnosis Date  . AICD (automatic cardioverter/defibrillator) present 10/08/2014   SUBQ    /    DR [name]  . CHF (congestive heart failure) ([place])   . Diabetes mellitus without complication ([place])   . History of cardiac  cath 05/2007   normal-with patent coronaries  . History of colonoscopy   . History of hiatal hernia   . Hypertension   . Iatrogenic thyroiditis   . Non-ischemic cardiomyopathy ([place])    EF 28%- reassessment of LV function 2011 with LVEf 45-50%  . PAD (peripheral artery disease) (HCC)    lower extremities with ABIs of 0.5 bilaterally  . Personal history of goiter   . S/P radioactive iodine thyroid ablation   . S/P thyroidectomy   . Shortness of breath dyspnea     Tobacco History: Social History   Tobacco Use  Smoking Status Current Some Day Smoker  . Packs/day: 0.25  . Years: 40.00  . Pack years: 10.00  . Types: Cigarettes  Smokeless Tobacco Never Used  Tobacco Comment   "This was the final straw"   Ready to quit: No Counseling given: Yes Comment: "This was the final straw"   Outpatient Encounter Medications as of 08/06/2017  Medication Sig  . aspirin 81 MG tablet Take 81 mg by mouth daily.    Marland Kitchen atorvastatin (LIPITOR) 40 MG tablet Take 1.5 tablets (60 mg total) by mouth daily.  . carvedilol (COREG) 25 MG tablet Take 1 tablet (25 mg total) by mouth 2 (two) times daily with a meal.  . furosemide (LASIX) 40 MG tablet Take 1 tablet (40 mg total) by mouth 2 (two) times daily.  Marland Kitchen glucose blood (ONETOUCH VERIO) test strip Use as instructed to check blood  sugar once a day  . hydrALAZINE (APRESOLINE) 50 MG tablet Take 1 tablet (50 mg total) by mouth 3 (three) times daily.  . isosorbide mononitrate (IMDUR) 30 MG 24 hr tablet take 2 tablet by mouth once daily  . levothyroxine (SYNTHROID) 175 MCG tablet Take 1 tablet (175 mcg total) by mouth daily before breakfast.  . metFORMIN (GLUCOPHAGE) 1000 MG tablet Take 1,000 mg by mouth every other day.  . nicotine (NICODERM CQ - DOSED IN MG/24 HR) 7 mg/24hr patch Place 1 patch (7 mg total) onto the skin daily.  Glory Rosebush DELICA LANCETS 33A MISC 1 Device by Other route daily.  . sacubitril-valsartan (ENTRESTO) 49-51 MG Take 1 tablet by mouth 2  (two) times daily.  Marland Kitchen spironolactone (ALDACTONE) 50 MG tablet Take 1 tablet (50 mg total) by mouth daily. (Patient not taking: Reported on 08/06/2017)  . zolpidem (AMBIEN) 5 MG tablet Take 1 tablet (5 mg total) by mouth at bedtime as needed for sleep. (Patient not taking: Reported on 08/06/2017)   No facility-administered encounter medications on file as of 08/06/2017.      Review of Systems  Constitutional:   No  weight loss, night sweats,  Fevers, chills, fatigue, or  lassitude.  HEENT:   No headaches,  Difficulty swallowing,  Tooth/dental problems, or  Sore throat,                No sneezing, itching, ear ache, nasal congestion, post nasal drip,   CV:  No chest pain,  Orthopnea, PND, swelling in lower extremities, anasarca, dizziness, palpitations, syncope.   GI  No heartburn, indigestion, abdominal pain, nausea, vomiting, diarrhea, change in bowel habits, loss of appetite, bloody stools.   Resp: No shortness of breath with exertion or at rest.  No excess mucus, no productive cough,  No non-productive cough,  No coughing up of blood.  No change in color of mucus.  No wheezing.  No chest wall deformity  Skin: no rash or lesions.  GU: no dysuria, change in color of urine, no urgency or frequency.  No flank pain, no hematuria   MS:  No joint pain or swelling.  No decreased range of motion.  No back pain.    Physical Exam  BP (!) 142/70 (BP Location: Left Arm, Cuff Size: Normal)   Pulse 95   Ht 5\' 10"  (1.778 m)   Wt 202 lb (91.6 kg)   SpO2 98%   BMI 28.98 kg/m   GEN: A/Ox3; pleasant , NAD, well nourished    HEENT:  [place]/AT,  EACs-clear, TMs-wnl, NOSE-clear, THROAT-clear, no lesions, no postnasal drip or exudate noted. Class 2 MP airway   NECK:  Supple w/ fair ROM; no JVD; normal carotid impulses w/o bruits; no thyromegaly or nodules palpated; no lymphadenopathy.    RESP  Clear  P & A; w/o, wheezes/ rales/ or rhonchi. no accessory muscle use, no dullness to percussion  CARD:   RRR, no m/r/g, no peripheral edema, pulses intact, no cyanosis or clubbing.  GI:   Soft & nt; nml bowel sounds; no organomegaly or masses detected.   Musco: Warm bil, no deformities or joint swelling noted.   Neuro: alert, no focal deficits noted.    Skin: Warm, no lesions or rashes    Lab Results:  CBC  BMET  Imaging: No results found.   Assessment & Plan:   Complex sleep apnea syndrome Improved control on BiPAP.  Continue on current regimen Order for new facemask  Plan  Patient Instructions  Keep up good work , continue on BIPAP At bedtime   Work on healthy weight .  Order for dream wear full face .  Do not drive if sleepy .  Follow up with Dr. Halford Chessman in 4 months and As needed       Cigarette smoker Smoking cessation discussed      Rexene Edison, NP 08/06/2017

## 2017-08-20 ENCOUNTER — Encounter: Payer: Medicare Other | Admitting: *Deleted

## 2017-08-20 VITALS — BP 140/76 | HR 80 | Wt 201.2 lb

## 2017-08-20 DIAGNOSIS — Z006 Encounter for examination for normal comparison and control in clinical research program: Secondary | ICD-10-CM

## 2017-08-20 NOTE — Progress Notes (Signed)
RESEARCH ENCOUNTER  Patient ID: TKEYA STENCIL  DOB: Jan 03, 1946  Paula Stein presented to the [facility] for the Week 12 visit of the [facility].  No signs/symptoms of ACS since the last visit. Subject compliant with IP, IP returned, and additional IP dispensed.  Study procedures completed without event.    Patient will follow up with [facility] in 12 weeks.

## 2017-08-28 LAB — HM DIABETES EYE EXAM

## 2017-08-29 ENCOUNTER — Ambulatory Visit (INDEPENDENT_AMBULATORY_CARE_PROVIDER_SITE_OTHER): Payer: Medicare Other | Admitting: *Deleted

## 2017-08-29 DIAGNOSIS — I428 Other cardiomyopathies: Secondary | ICD-10-CM

## 2017-08-29 LAB — FECAL OCCULT BLOOD, GUAIAC: FECAL OCCULT BLD: NEGATIVE

## 2017-08-30 NOTE — Progress Notes (Signed)
Remote ICD transmission.   

## 2017-09-03 NOTE — Progress Notes (Signed)
Subjective:   Paula Stein is a 72 y.o. female who presents for Medicare Annual (Subsequent) preventive examination.  Apt with Dr. Maudie Mercury at 10:30 Reports health as good   Currently in cardiac research program Last OV with Dr. 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 COLON CANCER SCREENING ANNUAL FOBT  07/18/2017  . FOOT EXAM  08/21/2017  . OPHTHALMOLOGY EXAM  09/04/2017   Colonoscopy postponed - may be contraindicated due to health FOBT just sent one off - through [place] she set up and did the colo guard for [facility] last years   Mammogram 09/2016   Dexa 09/2016  -1.3   Qualifies for Shingles Vaccine? Zoster 07/2014 Educated regarding shingrix;  Zoster was in 2016  Current smoker  States she may quit if she could use the patches but [employer] want pay for the patches     Objective:     Vitals: BP 132/62   Pulse 86   Ht 5\' 10"  (1.778 m)   Wt 201 lb 6 oz (91.3 kg)   SpO2 98%   BMI 28.89 kg/m   Body mass index is 28.89 kg/m.  Advanced Directives 09/04/2017 09/04/2017 05/22/2017 09/13/2016 08/31/2016 08/21/2016 06/12/2016  Does Patient Have a Medical Advance Directive? Yes Yes Yes Yes Yes Yes No  Type of Advance Directive - - Living will [place];Living will - Living will;Healthcare Power of Attorney -  Does patient want to make changes to medical advance directive? - - No - Patient declined No - Patient declined - - -  Copy of [place] in Chart? - - - No - copy requested Yes No - copy requested -  Would patient like  information on creating a medical advance directive? - - - - - - No - Patient declined    Tobacco Social History   Tobacco Use  Smoking Status Current Some Day Smoker  . Packs/day: 0.25  . Years: 40.00  . Pack years: 10.00  . Types: Cigarettes  Smokeless Tobacco Never Used  Tobacco Comment   "This was the final straw"; quit x 1; may quit if she had the patch      Ready to quit: Yes Counseling given: Yes Comment: "This was the final straw"; quit x 1; may quit if she had the patch    Clinical Intake:   Past Medical History:  Diagnosis Date  . AICD (automatic cardioverter/defibrillator) present 10/08/2014   SUBQ    /    DR [name]  . CHF (congestive heart failure) ([place])   . Diabetes mellitus without complication ([place])   . History of cardiac cath 05/2007   normal-with patent coronaries  . History of colonoscopy   . History of hiatal hernia   . Hypertension   . Iatrogenic thyroiditis   . Non-ischemic cardiomyopathy ([place])    EF 28%- reassessment of LV function 2011 with LVEf 45-50%  . PAD (peripheral artery disease) (HCC)    lower extremities with ABIs of 0.5  bilaterally  . Personal history of goiter   . S/P radioactive iodine thyroid ablation   . S/P thyroidectomy   . Shortness of breath dyspnea    Past Surgical History:  Procedure Laterality Date  . EP IMPLANTABLE DEVICE N/A 10/08/2014   Procedure: SubQ ICD Implant;  Surgeon: [name], MD;  Location: [facility];  Service: Cardiovascular;  Laterality: N/A;  . THYROIDECTOMY    . TONSILLECTOMY    . TOTAL ABDOMINAL HYSTERECTOMY     Family History  Problem Relation Age of Onset  . Diabetes type II Mother   . Hypertension Mother   . Diabetes Mother   . Heart disease Mother   . Other Father        deceased from accident age 2  . Hyperlipidemia Father    Social History   Socioeconomic History  . Marital status: Divorced    Spouse name: Not on file  . Number of children: 1  . Years of education: Not  on file  . Highest education level: Not on file  Occupational History  . Occupation: Retired    Fish farm manager: RETIRED    CommentAdvertising copywriter  Social Needs  . Financial resource strain: Not on file  . Food insecurity:    Worry: Not on file    Inability: Not on file  . Transportation needs:    Medical: Not on file    Non-medical: Not on file  Tobacco Use  . Smoking status: Current Some Day Smoker    Packs/day: 0.25    Years: 40.00    Pack years: 10.00    Types: Cigarettes  . Smokeless tobacco: Never Used  . Tobacco comment: "This was the final straw"; quit x 1; may quit if she had the patch   Substance and Sexual Activity  . Alcohol use: Yes    Alcohol/week: 0.0 oz    Comment: rare glass of wine  . Drug use: No  . Sexual activity: Not Currently  Lifestyle  . Physical activity:    Days per week: Not on file    Minutes per session: Not on file  . Stress: Not on file  Relationships  . Social connections:    Talks on phone: Not on file    Gets together: Not on file    Attends religious service: Not on file    Active member of club or organization: Not on file    Attends meetings of clubs or organizations: Not on file    Relationship status: Not on file  Other Topics Concern  . Not on file  Social History Narrative   Retired Education officer, museum   Divorced - one grown son   current smoker    Alcohol use-no      Drug use-no    Regular exercise- no     son Brenlynn Fake    Outpatient Encounter Medications as of 09/04/2017  Medication Sig  . aspirin 81 MG tablet Take 81 mg by mouth daily.    Marland Kitchen atorvastatin (LIPITOR) 40 MG tablet Take 1.5 tablets (60 mg total) by mouth daily.  . carvedilol (COREG) 25 MG tablet Take 1 tablet (25 mg total) by mouth 2 (two) times daily with a meal.  . furosemide (LASIX) 40 MG tablet Take 1 tablet (40 mg total) by mouth 2 (two) times daily.  Marland Kitchen glucose blood (ONETOUCH VERIO) test strip Use as instructed to check blood sugar once a day  .  hydrALAZINE (APRESOLINE) 50 MG tablet Take  1 tablet (50 mg total) by mouth 3 (three) times daily.  . isosorbide mononitrate (IMDUR) 30 MG 24 hr tablet take 2 tablet by mouth once daily  . levothyroxine (SYNTHROID) 175 MCG tablet Take 1 tablet (175 mcg total) by mouth daily before breakfast.  . metFORMIN (GLUCOPHAGE) 1000 MG tablet Take 1,000 mg by mouth every other day.  . nicotine (NICODERM CQ - DOSED IN MG/24 HR) 7 mg/24hr patch Place 1 patch (7 mg total) onto the skin daily.  Glory Rosebush DELICA LANCETS 09W MISC 1 Device by Other route daily.  . sacubitril-valsartan (ENTRESTO) 49-51 MG Take 1 tablet by mouth 2 (two) times daily.  Marland Kitchen spironolactone (ALDACTONE) 50 MG tablet Take 1 tablet (50 mg total) by mouth daily.  Marland Kitchen zolpidem (AMBIEN) 5 MG tablet Take 1 tablet (5 mg total) by mouth at bedtime as needed for sleep. (Patient not taking: Reported on 09/04/2017)   No facility-administered encounter medications on file as of 09/04/2017.     Activities of Daily Living In your present state of health, do you have any difficulty performing the following activities: 09/04/2017 05/22/2017  Hearing? N Y  Vision? N N  Difficulty concentrating or making decisions? N N  Walking or climbing stairs? Y Y  Comment right knee hurts  -  Dressing or bathing? N N  Doing errands, shopping? N Y  Conservation officer, nature and eating ? N -  Using the Toilet? N -  In the past six months, have you accidently leaked urine? N -  Do you have problems with loss of bowel control? N -  Managing your Medications? N -  Managing your Finances? N -  Housekeeping or managing your Housekeeping? N -  Some recent data might be hidden    Patient Care Team: Lucretia Kern, DO as PCP - General (Family Medicine) [name], MD as PCP - Cardiology (Cardiology)    Assessment:   This is a routine wellness examination for [place].  Exercise Activities and Dietary recommendations Current Exercise Habits: Home exercise routine, Type of  exercise: walking, Time (Minutes): 50, Frequency (Times/Week): 4, Weekly Exercise (Minutes/Week): 200, Intensity: Mild  Goals    . Patient Stated     Insomnia Insomnia is a sleep disorder that makes it difficult to fall asleep or to stay asleep. Insomnia can cause tiredness (fatigue), low energy, difficulty concentrating, mood swings, and poor performance at work or school. There are three different ways to classify insomnia:  Difficulty falling asleep.  Difficulty staying asleep.  Waking up too early in the morning.  Any type of insomnia can be long-term (chronic) or short-term (acute). Both are common. Short-term insomnia usually lasts for three months or less. Chronic insomnia occurs at least three times a week for longer than three months. What are the causes? Insomnia may be caused by another condition, situation, or substance, such as:  Anxiety.  Certain medicines.  Gastroesophageal reflux disease (GERD) or other gastrointestinal conditions.  Asthma or other breathing conditions.  Restless legs syndrome, sleep apnea, or other sleep disorders.  Chronic pain.  Menopause. This may include hot flashes.  Stroke.  Abuse of alcohol, tobacco, or illegal drugs.  Depression.  Caffeine.  Neurological disorders, such as Alzheimer disease.  An overactive thyroid (hyperthyroidism).  The cause of insomnia may not be known. What increases the risk? Risk factors for insomnia include:  Gender. Women are more commonly affected than men.  Age. Insomnia is more common as you get older.  Stress. This may involve your  professional or personal life.  Income. Insomnia is more common in people with lower income.  Lack of exercise.  Irregular work schedule or night shifts.  Traveling between different time zones.  What are the signs or symptoms? If you have insomnia, trouble falling asleep or trouble staying asleep is the main symptom. This may lead to other symptoms, such  as:  Feeling fatigued.  Feeling nervous about going to sleep.  Not feeling rested in the morning.  Having trouble concentrating.  Feeling irritable, anxious, or depressed.  How is this treated? Treatment for insomnia depends on the cause. If your insomnia is caused by an underlying condition, treatment will focus on addressing the condition. Treatment may also include:  Medicines to help you sleep.  Counseling or therapy.  Lifestyle adjustments.  Follow these instructions at home:  Take medicines only as directed by your health care provider.  Keep regular sleeping and waking hours. Avoid naps.  Keep a sleep diary to help you and your health care provider figure out what could be causing your insomnia. Include: ? When you sleep. ? When you wake up during the night. ? How well you sleep. ? How rested you feel the next day. ? Any side effects of medicines you are taking. ? What you eat and drink.  Make your bedroom a comfortable place where it is easy to fall asleep: ? Put up shades or special blackout curtains to block light from outside. ? Use a white noise machine to block noise. ? Keep the temperature cool.  Exercise regularly as directed by your health care provider. Avoid exercising right before bedtime.  Use relaxation techniques to manage stress. Ask your health care provider to suggest some techniques that may work well for you. These may include: ? Breathing exercises. ? Routines to release muscle tension. ? Visualizing peaceful scenes.  Cut back on alcohol, caffeinated beverages, and cigarettes, especially close to bedtime. These can disrupt your sleep.  Do not overeat or eat spicy foods right before bedtime. This can lead to digestive discomfort that can make it hard for you to sleep.  Limit screen use before bedtime. This includes: ? Watching TV. ? Using your smartphone, tablet, and computer.  Stick to a routine. This can help you fall asleep faster.  Try to do a quiet activity, brush your teeth, and go to bed at the same time each night.  Get out of bed if you are still awake after 15 minutes of trying to sleep. Keep the lights down, but try reading or doing a quiet activity. When you feel sleepy, go back to bed.  Make sure that you drive carefully. Avoid driving if you feel very sleepy.  Keep all follow-up appointments as directed by your health care provider. This is important. Contact a health care provider if:  You are tired throughout the day or have trouble in your daily routine due to sleepiness.  You continue to have sleep problems or your sleep problems get worse. Get help right away if:  You have serious thoughts about hurting yourself or someone else. This information is not intended to replace advice given to you by your health care provider. Make sure you discuss any questions you have with your health care provider. Document Released: 03/17/2000 Document Revised: 08/20/2015 Document Reviewed: 12/19/2013 Elsevier Interactive Patient Education  2018 [place]   Depression Screen [facility] 2/9 Scores 09/04/2017 08/21/2016 06/12/2016 09/09/2015  PHQ - 2 Score  0 0 0 0     Cognitive Function MMSE - Mini Mental State Exam 09/04/2017 08/31/2016  Not completed: (No Data) (No Data)     Ad8 score reviewed for issues:  Issues making decisions:  Less interest in hobbies / activities:  Repeats questions, stories (family complaining):  Trouble using ordinary gadgets (microwave, computer, phone):  Forgets the month or year:   Mismanaging finances:   Remembering appts:  Daily problems with thinking and/or memory: Ad8 score is=0        Immunization History  Administered Date(s) Administered  . Influenza Split 02/15/2011  . Influenza Whole 08/22/2004, 02/18/2007, 12/31/2008, 02/17/2010, 01/02/2012  . Influenza, High Dose Seasonal PF 01/04/2015, 12/11/2016  . Influenza,inj,Quad PF,6+ Mos 06/26/2013,  02/03/2014  . Influenza-Unspecified 02/02/2016  . Pneumococcal Conjugate-13 06/26/2013  . Pneumococcal Polysaccharide-23 08/22/2004, 02/03/2014  . Td 12/22/2002  . Tdap 06/26/2013  . Zoster 07/24/2014      Screening Tests Health Maintenance  Topic Date Due  . COLON CANCER SCREENING ANNUAL FOBT  07/18/2017  . FOOT EXAM  08/21/2017  . OPHTHALMOLOGY EXAM  09/04/2017  . COLONOSCOPY  06/08/2018 (Originally 08/23/2014)  . HEMOGLOBIN A1C  10/11/2017  . INFLUENZA VACCINE  11/01/2017  . MAMMOGRAM  09/20/2018  . TETANUS/TDAP  06/27/2023  . DEXA SCAN  Completed  . Hepatitis C Screening  Completed  . PNA vac Low Risk Adult  Completed         Plan:      PCP Notes   Health Maintenance Educated regarding the shingrix  Will schedule her mammogram  Mild bone loss  Will make an apt with Dr. Donnella Bi guard by [facility] last year; requested she call and get the report   Abnormal Screens  none  Referrals  none  Patient concerns; Needs a glucometer and strips set (whatever UHC pays for )   States one night last week she "broke out in a sweat"  Ate some jelly beans and felt better  States she does not sleep well Sleeps 11pm and awake at 2am - 4am and up at 7am Does use her cpap   Right knee gives away sometimes; stumbles but does not fall  Keeps a cane in the car  Nurse Concerns; As noted   Next PCP apt today   I have personally reviewed and noted the following in the patient's chart:   . Medical and social history . Use of alcohol, tobacco or illicit drugs  . Current medications and supplements . Functional ability and status . Nutritional status . Physical activity . Advanced directives . List of other physicians . Hospitalizations, surgeries, and ER visits in previous 12 months . Vitals . Screenings to include cognitive, depression, and falls . Referrals and appointments  In addition, I have reviewed and discussed with patient certain preventive protocols,  quality metrics, and best practice recommendations. A written personalized care plan for preventive services as well as general preventive health recommendations were provided to patient.     Wynetta Fines, RN  09/04/2017

## 2017-09-03 NOTE — Progress Notes (Addendum)
HPI:  Using dictation device. Unfortunately this device frequently misinterprets words/phrases.  Paula Stein is a pleasant 72 y.o. here for follow up. Chronic medical problems summarized below were reviewed for changes and stability and were updated as needed below. Reports doing well for the most part. Requests that I fill our form for replacement of her diabetic shoes. Sees podiatry - see below. Foot exam done with Manuela Schwartz today.  Smoking only 1-2 cigarettes per day and improving diet/exercise with some wt reduction. Denies CP, SOB, DOE, treatment intolerance or new symptoms.  AWV 08/13/16 Due for colon ca screening, AWV (did with Manuela Schwartz today), foot exam - done with Manuela Schwartz today Due for labs  DM w/ neuropathy: -meds: metformin -only taking once daily - does not tolerate more -has neuropathy in feet, sees podiatrist - wants re-order of her diabetic shoes - has hx pre-ulcer callous  -does eye exam yearly - sees a doctor at [facility]  Hypothyroidism: -meds: synthroid  Hx of Smoking: -quit 05/2017  HTN/CsCHF w/ AICD, cardiomyopathy, HLD, PAD, OSA: -sees cardiologist - on many meds -cards following/managing  -seeing cardiologist regarding her cholesterol  ROS: See pertinent positives and negatives per HPI.  Past Medical History:  Diagnosis Date  . AICD (automatic cardioverter/defibrillator) present 10/08/2014   SUBQ    /    DR [name]  . CHF (congestive heart failure) ([place])   . Diabetes mellitus without complication ([place])   . History of cardiac cath 05/2007   normal-with patent coronaries  . History of colonoscopy   . History of hiatal hernia   . Hypertension   . Iatrogenic thyroiditis   . Non-ischemic cardiomyopathy ([place])    EF 28%- reassessment of LV function 2011 with LVEf 45-50%  . PAD (peripheral artery disease) (HCC)    lower extremities with ABIs of 0.5 bilaterally  . Personal history of goiter   . S/P radioactive iodine thyroid ablation   . S/P thyroidectomy   .  Shortness of breath dyspnea     Past Surgical History:  Procedure Laterality Date  . EP IMPLANTABLE DEVICE N/A 10/08/2014   Procedure: SubQ ICD Implant;  Surgeon: [name], MD;  Location: [facility];  Service: Cardiovascular;  Laterality: N/A;  . THYROIDECTOMY    . TONSILLECTOMY    . TOTAL ABDOMINAL HYSTERECTOMY      Family History  Problem Relation Age of Onset  . Diabetes type II Mother   . Hypertension Mother   . Diabetes Mother   . Heart disease Mother   . Other Father        deceased from accident age 15  . Hyperlipidemia Father     SOCIAL HX: see hpi   Current Outpatient Medications:  .  aspirin 81 MG tablet, Take 81 mg by mouth daily.  , Disp: , Rfl:  .  atorvastatin (LIPITOR) 40 MG tablet, Take 1.5 tablets (60 mg total) by mouth daily., Disp: 135 tablet, Rfl: 3 .  carvedilol (COREG) 25 MG tablet, Take 1 tablet (25 mg total) by mouth 2 (two) times daily with a meal., Disp: 180 tablet, Rfl: 3 .  furosemide (LASIX) 40 MG tablet, Take 1 tablet (40 mg total) by mouth 2 (two) times daily., Disp: 180 tablet, Rfl: 1 .  glucose blood (ONETOUCH VERIO) test strip, Use as instructed to check blood sugar once a day, Disp: 100 each, Rfl: 3 .  hydrALAZINE (APRESOLINE) 50 MG tablet, Take 1 tablet (50 mg total) by mouth 3 (three) times daily., Disp:  90 tablet, Rfl: 0 .  isosorbide mononitrate (IMDUR) 30 MG 24 hr tablet, take 2 tablet by mouth once daily, Disp: 180 tablet, Rfl: 3 .  levothyroxine (SYNTHROID) 175 MCG tablet, Take 1 tablet (175 mcg total) by mouth daily before breakfast., Disp: 90 tablet, Rfl: 1 .  metFORMIN (GLUCOPHAGE) 1000 MG tablet, Take 1,000 mg by mouth every other day., Disp: , Rfl:  .  nicotine (NICODERM CQ - DOSED IN MG/24 HR) 7 mg/24hr patch, Place 1 patch (7 mg total) onto the skin daily., Disp: 28 patch, Rfl: 0 .  ONETOUCH DELICA LANCETS 66Z MISC, 1 Device by Other route daily., Disp: 100 each, Rfl: 3 .  sacubitril-valsartan (ENTRESTO) 49-51 MG, Take  1 tablet by mouth 2 (two) times daily., Disp: 60 tablet, Rfl: 10 .  spironolactone (ALDACTONE) 50 MG tablet, Take 1 tablet (50 mg total) by mouth daily., Disp: 30 tablet, Rfl: 11 .  zolpidem (AMBIEN) 5 MG tablet, Take 1 tablet (5 mg total) by mouth at bedtime as needed for sleep., Disp: 30 tablet, Rfl: 0  EXAM:  Vitals:   09/04/17 1018  BP: 132/62  Pulse: 86  Temp: 97.8 F (36.6 C)    Body mass index is 28.93 kg/m.  GENERAL: vitals reviewed and listed above, alert, oriented, appears well hydrated and in no acute distress  HEENT: atraumatic, conjunttiva clear, no obvious abnormalities on inspection of external nose and ears  NECK: no obvious masses on inspection  LUNGS: clear to auscultation bilaterally, no wheezes, rales or rhonchi, good air movement  CV: HRRR, no peripheral edema  MS: moves all extremities without noticeable abnormality  PSYCH: pleasant and cooperative, no obvious depression or anxiety  ASSESSMENT AND PLAN:  Discussed the following assessment and plan:  Type 2 diabetes mellitus with neurological complications ([place]) - Plan: Hemoglobin A1c -labs -lifestyle recs -form completed for diabetic shoes  Hyperlipidemia associated with type 2 diabetes mellitus ([place]) -seeing cardiology for management  Hypertension associated with diabetes ([place]) - Plan: Basic metabolic panel, CBC -sees cardiology  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  -Patient advised to return or notify  a doctor immediately if symptoms worsen or persist or new concerns arise.  Patient Instructions  BEFORE YOU LEAVE: -labs -finish diabetic form -follow up: 3-4 months  NO MORE cigarettes! [place] gum or lozenge instead.  We have ordered labs or studies at this visit. It can take up to 1-2 weeks for results and processing. IF results require follow up or explanation, we will call you with instructions. Clinically stable results will be released to your [facility]. If you have not heard from [place] or cannot find your results in [facility] in 2 weeks please contact our office at [phone].  If you are not yet signed up for [facility], please consider signing up.           Lucretia Kern, DO

## 2017-09-04 ENCOUNTER — Encounter: Payer: Self-pay | Admitting: Family Medicine

## 2017-09-04 ENCOUNTER — Ambulatory Visit: Payer: Medicare Other | Admitting: Family Medicine

## 2017-09-04 ENCOUNTER — Ambulatory Visit (INDEPENDENT_AMBULATORY_CARE_PROVIDER_SITE_OTHER): Payer: Medicare Other

## 2017-09-04 VITALS — BP 132/62 | HR 86 | Temp 97.8°F | Ht 70.0 in | Wt 201.6 lb

## 2017-09-04 VITALS — BP 132/62 | HR 86 | Ht 70.0 in | Wt 201.4 lb

## 2017-09-04 DIAGNOSIS — I2510 Atherosclerotic heart disease of native coronary artery without angina pectoris: Secondary | ICD-10-CM

## 2017-09-04 DIAGNOSIS — E1159 Type 2 diabetes mellitus with other circulatory complications: Secondary | ICD-10-CM | POA: Diagnosis not present

## 2017-09-04 DIAGNOSIS — I10 Essential (primary) hypertension: Secondary | ICD-10-CM | POA: Diagnosis not present

## 2017-09-04 DIAGNOSIS — I739 Peripheral vascular disease, unspecified: Secondary | ICD-10-CM

## 2017-09-04 DIAGNOSIS — E039 Hypothyroidism, unspecified: Secondary | ICD-10-CM | POA: Diagnosis not present

## 2017-09-04 DIAGNOSIS — E785 Hyperlipidemia, unspecified: Secondary | ICD-10-CM

## 2017-09-04 DIAGNOSIS — Z0000 Encounter for general adult medical examination without abnormal findings: Secondary | ICD-10-CM | POA: Diagnosis not present

## 2017-09-04 DIAGNOSIS — I70219 Atherosclerosis of native arteries of extremities with intermittent claudication, unspecified extremity: Secondary | ICD-10-CM | POA: Diagnosis not present

## 2017-09-04 DIAGNOSIS — I5042 Chronic combined systolic (congestive) and diastolic (congestive) heart failure: Secondary | ICD-10-CM | POA: Diagnosis not present

## 2017-09-04 DIAGNOSIS — F17210 Nicotine dependence, cigarettes, uncomplicated: Secondary | ICD-10-CM | POA: Diagnosis not present

## 2017-09-04 DIAGNOSIS — E1149 Type 2 diabetes mellitus with other diabetic neurological complication: Secondary | ICD-10-CM

## 2017-09-04 DIAGNOSIS — Z720 Tobacco use: Secondary | ICD-10-CM

## 2017-09-04 DIAGNOSIS — E1169 Type 2 diabetes mellitus with other specified complication: Secondary | ICD-10-CM

## 2017-09-04 LAB — BASIC METABOLIC PANEL
BUN: 13 mg/dL (ref 6–23)
CO2: 27 meq/L (ref 19–32)
CREATININE: 0.92 mg/dL (ref 0.40–1.20)
Calcium: 10 mg/dL (ref 8.4–10.5)
Chloride: 105 mEq/L (ref 96–112)
GFR: 76.72 mL/min (ref >60.00)
GLUCOSE: 104 mg/dL — AB (ref 70–99)
Potassium: 3.9 mEq/L (ref 3.5–5.1)
Sodium: 141 mEq/L (ref 135–145)

## 2017-09-04 LAB — CBC
HCT: 41.2 % (ref 36.0–46.0)
Hemoglobin: 13.3 g/dL (ref 12.0–15.0)
MCHC: 32.2 g/dL (ref 30.0–36.0)
MCV: 90.4 fl (ref 78.0–100.0)
Platelets: 306 10*3/uL (ref 150.0–400.0)
RBC: 4.56 Mil/uL (ref 3.87–5.11)
RDW: 15.5 % (ref 11.5–15.5)
WBC: 6.8 10*3/uL (ref 4.0–10.5)

## 2017-09-04 LAB — CUP PACEART REMOTE DEVICE CHECK
Implantable Lead Implant Date: 20160707
Implantable Lead Location: 753858
Implantable Pulse Generator Implant Date: 20160707
MDC IDC MSMT BATTERY REMAINING PERCENTAGE: 68 %
MDC IDC PG SERIAL: 115852
MDC IDC SESS DTM: 20190529113900

## 2017-09-04 LAB — TSH: TSH: 0.2 u[IU]/mL — ABNORMAL LOW (ref 0.35–4.50)

## 2017-09-04 LAB — HEMOGLOBIN A1C: HEMOGLOBIN A1C: 6.6 % — AB (ref 4.6–6.5)

## 2017-09-04 NOTE — Patient Instructions (Addendum)
Paula Stein , Thank you for taking time to come for your Medicare Wellness Visit. I appreciate your ongoing commitment to your health goals. Please review the following plan we discussed and let me know if I can assist you in the future.   Please check with [facility] or call the nurse who visited you. Tell them we need the results of the cologuard that you did last year   Will schedule Mammogram at [place]   Mild amount of bone loss Calcium 1200 mg with Vit D 800 to 1000 u a day Weight bearing exercise The osteoporosis [email or web address]   Will make an apt with DR. Teressa Senter every year and this year in Dec   Can try the book "Say goodnight to insomnia"    These are the goals we discussed: Goals    . Patient Stated     Insomnia Insomnia is a sleep disorder that makes it difficult to fall asleep or to stay asleep. Insomnia can cause tiredness (fatigue), low energy, difficulty concentrating, mood swings, and poor performance at work or school. There are three different ways to classify insomnia:  Difficulty falling asleep.  Difficulty staying asleep.  Waking up too early in the morning.  Any type of insomnia can be long-term (chronic) or short-term (acute). Both are common. Short-term insomnia usually lasts for three months or less. Chronic insomnia occurs at least three times a week for longer than three months. What are the causes? Insomnia may be caused by another condition, situation, or substance, such as:  Anxiety.  Certain medicines.  Gastroesophageal reflux disease (GERD) or other gastrointestinal conditions.  Asthma or other breathing conditions.  Restless legs syndrome, sleep apnea, or other sleep disorders.  Chronic pain.  Menopause. This may include hot flashes.  Stroke.  Abuse of alcohol, tobacco, or illegal drugs.  Depression.  Caffeine.  Neurological disorders, such as Alzheimer disease.  An overactive thyroid (hyperthyroidism).  The cause of insomnia  may not be known. What increases the risk? Risk factors for insomnia include:  Gender. Women are more commonly affected than men.  Age. Insomnia is more common as you get older.  Stress. This may involve your professional or personal life.  Income. Insomnia is more common in people with lower income.  Lack of exercise.  Irregular work schedule or night shifts.  Traveling between different time zones.  What are the signs or symptoms? If you have insomnia, trouble falling asleep or trouble staying asleep is the main symptom. This may lead to other symptoms, such as:  Feeling fatigued.  Feeling nervous about going to sleep.  Not feeling rested in the morning.  Having trouble concentrating.  Feeling irritable, anxious, or depressed.  How is this treated? Treatment for insomnia depends on the cause. If your insomnia is caused by an underlying condition, treatment will focus on addressing the condition. Treatment may also include:  Medicines to help you sleep.  Counseling or therapy.  Lifestyle adjustments.  Follow these instructions at home:  Take medicines only as directed by your health care provider.  Keep regular sleeping and waking hours. Avoid naps.  Keep a sleep diary to help you and your health care provider figure out what could be causing your insomnia. Include: ? When you sleep. ? When you wake up during the night. ? How well you sleep. ? How rested you feel the next day. ? Any side effects of medicines you are taking. ? What you eat and drink.  Make your bedroom a  comfortable place where it is easy to fall asleep: ? Put up shades or special blackout curtains to block light from outside. ? Use a white noise machine to block noise. ? Keep the temperature cool.  Exercise regularly as directed by your health care provider. Avoid exercising right before bedtime.  Use relaxation techniques to manage stress. Ask your health care provider to suggest some  techniques that may work well for you. These may include: ? Breathing exercises. ? Routines to release muscle tension. ? Visualizing peaceful scenes.  Cut back on alcohol, caffeinated beverages, and cigarettes, especially close to bedtime. These can disrupt your sleep.  Do not overeat or eat spicy foods right before bedtime. This can lead to digestive discomfort that can make it hard for you to sleep.  Limit screen use before bedtime. This includes: ? Watching TV. ? Using your smartphone, tablet, and computer.  Stick to a routine. This can help you fall asleep faster. Try to do a quiet activity, brush your teeth, and go to bed at the same time each night.  Get out of bed if you are still awake after 15 minutes of trying to sleep. Keep the lights down, but try reading or doing a quiet activity. When you feel sleepy, go back to bed.  Make sure that you drive carefully. Avoid driving if you feel very sleepy.  Keep all follow-up appointments as directed by your health care provider. This is important. Contact a health care provider if:  You are tired throughout the day or have trouble in your daily routine due to sleepiness.  You continue to have sleep problems or your sleep problems get worse. Get help right away if:  You have serious thoughts about hurting yourself or someone else. This information is not intended to replace advice given to you by your health care provider. Make sure you discuss any questions you have with your health care provider. Document Released: 03/17/2000 Document Revised: 08/20/2015 Document Reviewed: 12/19/2013 Elsevier Interactive Patient Education  [employer].        This is a list of the screening recommended for you and due dates:  Health Maintenance  Topic Date Due  . Stool Blood Test  07/18/2017  . Complete foot exam   08/21/2017  . Eye exam for diabetics  09/04/2017  . Colon Cancer Screening  06/08/2018*  . Hemoglobin A1C  10/11/2017   . Flu Shot  11/01/2017  . Mammogram  09/20/2018  . Tetanus Vaccine  06/27/2023  . DEXA scan (bone density measurement)  Completed  .  Hepatitis C: One time screening is recommended by Center for Disease Control  (CDC) for  adults born from [birth year] through 1967.   Completed  . Pneumonia vaccines  Completed  *Topic was postponed. The date shown is not the original due date.     Bone Densitometry Bone densitometry is an imaging test that uses a special X-ray to measure the amount of calcium and other minerals in your bones (bone density). This test is also known as a bone mineral density test or dual-energy X-ray absorptiometry (DXA). The test can measure bone density at your hip and your spine. It is similar to having a regular X-ray. You may have this test to:  Diagnose a condition that causes weak or thin bones (osteoporosis).  Predict your risk of a broken bone (fracture).  Determine how well osteoporosis treatment is working.  Tell a health care provider about:  Any allergies you have.  All medicines  you are taking, including vitamins, herbs, eye drops, creams, and over-the-counter medicines.  Any problems you or family members have had with anesthetic medicines.  Any blood disorders you have.  Any surgeries you have had.  Any medical conditions you have.  Possibility of pregnancy.  Any other medical test you had within the previous 14 days that used contrast material. What are the risks? Generally, this is a safe procedure. However, problems can occur and may include the following:  This test exposes you to a very small amount of radiation.  The risks of radiation exposure may be greater to unborn children.  What happens before the procedure?  Do not take any calcium supplements for 24 hours before having the test. You can otherwise eat and drink what you usually do.  Take off all metal jewelry, eyeglasses, dental appliances, and any other metal objects. What  happens during the procedure?  You may lie on an exam table. There will be an X-ray generator below you and an imaging device above you.  Other devices, such as boxes or braces, may be used to position your body properly for the scan.  You will need to lie still while the machine slowly scans your body.  The images will show up on a computer monitor. What happens after the procedure? You may need more testing at a later time. This information is not intended to replace advice given to you by your health care provider. Make sure you discuss any questions you have with your health care provider. Document Released: 04/11/2004 Document Revised: 08/26/2015 Document Reviewed: 08/28/2013 Elsevier Interactive Patient Education  2018 [place] in the Home Falls can cause injuries. They can happen to people of all ages. There are many things you can do to make your home safe and to help prevent falls. What can I do on the outside of my home?  Regularly fix the edges of walkways and driveways and fix any cracks.  Remove anything that might make you trip as you walk through a door, such as a raised step or threshold.  Trim any bushes or trees on the path to your home.  Use bright outdoor lighting.  Clear any walking paths of anything that might make someone trip, such as rocks or tools.  Regularly check to see if handrails are loose or broken. Make sure that both sides of any steps have handrails.  Any raised decks and porches should have guardrails on the edges.  Have any leaves, snow, or ice cleared regularly.  Use sand or salt on walking paths during winter.  Clean up any spills in your garage right away. This includes oil or grease spills. What can I do in the bathroom?  Use night lights.  Install grab bars by the toilet and in the tub and shower. Do not use towel bars as grab bars.  Use non-skid mats or decals in the tub or shower.  If you need to sit  down in the shower, use a plastic, non-slip stool.  Keep the floor dry. Clean up any water that spills on the floor as soon as it happens.  Remove soap buildup in the tub or shower regularly.  Attach bath mats securely with double-sided non-slip rug tape.  Do not have throw rugs and other things on the floor that can make you trip. What can I do in the bedroom?  Use night lights.  Make sure that you have a light by your bed that  is easy to reach.  Do not use any sheets or blankets that are too big for your bed. They should not hang down onto the floor.  Have a firm chair that has side arms. You can use this for support while you get dressed.  Do not have throw rugs and other things on the floor that can make you trip. What can I do in the kitchen?  Clean up any spills right away.  Avoid walking on wet floors.  Keep items that you use a lot in easy-to-reach places.  If you need to reach something above you, use a strong step stool that has a grab bar.  Keep electrical cords out of the way.  Do not use floor polish or wax that makes floors slippery. If you must use wax, use non-skid floor wax.  Do not have throw rugs and other things on the floor that can make you trip. What can I do with my stairs?  Do not leave any items on the stairs.  Make sure that there are handrails on both sides of the stairs and use them. Fix handrails that are broken or loose. Make sure that handrails are as long as the stairways.  Check any carpeting to make sure that it is firmly attached to the stairs. Fix any carpet that is loose or worn.  Avoid having throw rugs at the top or bottom of the stairs. If you do have throw rugs, attach them to the floor with carpet tape.  Make sure that you have a light switch at the top of the stairs and the bottom of the stairs. If you do not have them, ask someone to add them for you. What else can I do to help prevent falls?  Wear shoes that: ? Do not have  high heels. ? Have rubber bottoms. ? Are comfortable and fit you well. ? Are closed at the toe. Do not wear sandals.  If you use a stepladder: ? Make sure that it is fully opened. Do not climb a closed stepladder. ? Make sure that both sides of the stepladder are locked into place. ? Ask someone to hold it for you, if possible.  Clearly mark and make sure that you can see: ? Any grab bars or handrails. ? First and last steps. ? Where the edge of each step is.  Use tools that help you move around (mobility aids) if they are needed. These include: ? Canes. ? Walkers. ? Scooters. ? Crutches.  Turn on the lights when you go into a dark area. Replace any light bulbs as soon as they burn out.  Set up your furniture so you have a clear path. Avoid moving your furniture around.  If any of your floors are uneven, fix them.  If there are any pets around you, be aware of where they are.  Review your medicines with your doctor. Some medicines can make you feel dizzy. This can increase your chance of falling. Ask your doctor what other things that you can do to help prevent falls. This information is not intended to replace advice given to you by your health care provider. Make sure you discuss any questions you have with your health care provider. Document Released: 01/14/2009 Document Revised: 08/26/2015 Document Reviewed: 04/24/2014 Elsevier Interactive Patient Education  2018 [place] Maintenance, Female Adopting a healthy lifestyle and getting preventive care can go a long way to promote health and wellness. Talk with your health care provider  about what schedule of regular examinations is right for you. This is a good chance for you to check in with your provider about disease prevention and staying healthy. In between checkups, there are plenty of things you can do on your own. Experts have done a lot of research about which lifestyle changes and preventive measures are  most likely to keep you healthy. Ask your health care provider for more information. Weight and diet Eat a healthy diet  Be sure to include plenty of vegetables, fruits, low-fat dairy products, and lean protein.  Do not eat a lot of foods high in solid fats, added sugars, or salt.  Get regular exercise. This is one of the most important things you can do for your health. ? Most adults should exercise for at least 150 minutes each week. The exercise should increase your heart rate and make you sweat (moderate-intensity exercise). ? Most adults should also do strengthening exercises at least twice a week. This is in addition to the moderate-intensity exercise.  Maintain a healthy weight  Body mass index (BMI) is a measurement that can be used to identify possible weight problems. It estimates body fat based on height and weight. Your health care provider can help determine your BMI and help you achieve or maintain a healthy weight.  For females 58 years of age and older: ? A BMI below 18.5 is considered underweight. ? A BMI of 18.5 to 24.9 is normal. ? A BMI of 25 to 29.9 is considered overweight. ? A BMI of 30 and above is considered obese.  Watch levels of cholesterol and blood lipids  You should start having your blood tested for lipids and cholesterol at 74 years of age, then have this test every 5 years.  You may need to have your cholesterol levels checked more often if: ? Your lipid or cholesterol levels are high. ? You are older than 74 years of age. ? You are at high risk for heart disease.  Cancer screening Lung Cancer  Lung cancer screening is recommended for adults 3-4 years old who are at high risk for lung cancer because of a history of smoking.  A yearly low-dose CT scan of the lungs is recommended for people who: ? Currently smoke. ? Have quit within the past 15 years. ? Have at least a 30-pack-year history of smoking. A pack year is smoking an average of one  pack of cigarettes a day for 1 year.  Yearly screening should continue until it has been 15 years since you quit.  Yearly screening should stop if you develop a health problem that would prevent you from having lung cancer treatment.  Breast Cancer  Practice breast self-awareness. This means understanding how your breasts normally appear and feel.  It also means doing regular breast self-exams. Let your health care provider know about any changes, no matter how small.  If you are in your 20s or 30s, you should have a clinical breast exam (CBE) by a health care provider every 1-3 years as part of a regular health exam.  If you are 46 or older, have a CBE every year. Also consider having a breast X-ray (mammogram) every year.  If you have a family history of breast cancer, talk to your health care provider about genetic screening.  If you are at high risk for breast cancer, talk to your health care provider about having an MRI and a mammogram every year.  Breast cancer gene (BRCA) assessment is recommended  for women who have family members with BRCA-related cancers. BRCA-related cancers include: ? Breast. ? Ovarian. ? Tubal. ? Peritoneal cancers.  Results of the assessment will determine the need for genetic counseling and BRCA1 and BRCA2 testing.  Cervical Cancer Your health care provider may recommend that you be screened regularly for cancer of the pelvic organs (ovaries, uterus, and vagina). This screening involves a pelvic examination, including checking for microscopic changes to the surface of your cervix (Pap test). You may be encouraged to have this screening done every 3 years, beginning at age 17.  For women ages 70-65, health care providers may recommend pelvic exams and Pap testing every 3 years, or they may recommend the Pap and pelvic exam, combined with testing for human papilloma virus (HPV), every 5 years. Some types of HPV increase your risk of cervical cancer. Testing  for HPV may also be done on women of any age with unclear Pap test results.  Other health care providers may not recommend any screening for nonpregnant women who are considered low risk for pelvic cancer and who do not have symptoms. Ask your health care provider if a screening pelvic exam is right for you.  If you have had past treatment for cervical cancer or a condition that could lead to cancer, you need Pap tests and screening for cancer for at least 20 years after your treatment. If Pap tests have been discontinued, your risk factors (such as having a new sexual partner) need to be reassessed to determine if screening should resume. Some women have medical problems that increase the chance of getting cervical cancer. In these cases, your health care provider may recommend more frequent screening and Pap tests.  Colorectal Cancer  This type of cancer can be detected and often prevented.  Routine colorectal cancer screening usually begins at 74 years of age and continues through 74 years of age.  Your health care provider may recommend screening at an earlier age if you have risk factors for colon cancer.  Your health care provider may also recommend using home test kits to check for hidden blood in the stool.  A small camera at the end of a tube can be used to examine your colon directly (sigmoidoscopy or colonoscopy). This is done to check for the earliest forms of colorectal cancer.  Routine screening usually begins at age 94.  Direct examination of the colon should be repeated every 5-10 years through 74 years of age. However, you may need to be screened more often if early forms of precancerous polyps or small growths are found.  Skin Cancer  Check your skin from head to toe regularly.  Tell your health care provider about any new moles or changes in moles, especially if there is a change in a mole's shape or color.  Also tell your health care provider if you have a mole that is  larger than the size of a pencil eraser.  Always use sunscreen. Apply sunscreen liberally and repeatedly throughout the day.  Protect yourself by wearing long sleeves, pants, a wide-brimmed hat, and sunglasses whenever you are outside.  Heart disease, diabetes, and high blood pressure  High blood pressure causes heart disease and increases the risk of stroke. High blood pressure is more likely to develop in: ? People who have blood pressure in the high end of the normal range (130-139/85-89 mm Hg). ? People who are overweight or obese. ? People who are African American.  If you are 18-39 years of  age, have your blood pressure checked every 3-5 years. If you are 96 years of age or older, have your blood pressure checked every year. You should have your blood pressure measured twice-once when you are at a hospital or clinic, and once when you are not at a hospital or clinic. Record the average of the two measurements. To check your blood pressure when you are not at a hospital or clinic, you can use: ? An automated blood pressure machine at a pharmacy. ? A home blood pressure monitor.  If you are between 71 years and 26 years old, ask your health care provider if you should take aspirin to prevent strokes.  Have regular diabetes screenings. This involves taking a blood sample to check your fasting blood sugar level. ? If you are at a normal weight and have a low risk for diabetes, have this test once every three years after 74 years of age. ? If you are overweight and have a high risk for diabetes, consider being tested at a younger age or more often. Preventing infection Hepatitis B  If you have a higher risk for hepatitis B, you should be screened for this virus. You are considered at high risk for hepatitis B if: ? You were born in a country where hepatitis B is common. Ask your health care provider which countries are considered high risk. ? Your parents were born in a high-risk country,  and you have not been immunized against hepatitis B (hepatitis B vaccine). ? You have HIV or AIDS. ? You use needles to inject street drugs. ? You live with someone who has hepatitis B. ? You have had sex with someone who has hepatitis B. ? You get hemodialysis treatment. ? You take certain medicines for conditions, including cancer, organ transplantation, and autoimmune conditions.  Hepatitis C  Blood testing is recommended for: ? Everyone born from [birth year] through 1967. ? Anyone with known risk factors for hepatitis C.  Sexually transmitted infections (STIs)  You should be screened for sexually transmitted infections (STIs) including gonorrhea and chlamydia if: ? You are sexually active and are younger than 74 years of age. ? You are older than 74 years of age and your health care provider tells you that you are at risk for this type of infection. ? Your sexual activity has changed since you were last screened and you are at an increased risk for chlamydia or gonorrhea. Ask your health care provider if you are at risk.  If you do not have HIV, but are at risk, it may be recommended that you take a prescription medicine daily to prevent HIV infection. This is called pre-exposure prophylaxis (PrEP). You are considered at risk if: ? You are sexually active and do not regularly use condoms or know the HIV status of your partner(s). ? You take drugs by injection. ? You are sexually active with a partner who has HIV.  Talk with your health care provider about whether you are at high risk of being infected with HIV. If you choose to begin PrEP, you should first be tested for HIV. You should then be tested every 3 months for as long as you are taking PrEP. Pregnancy  If you are premenopausal and you may become pregnant, ask your health care provider about preconception counseling.  If you may become pregnant, take 400 to 800 micrograms (mcg) of folic acid every day.  If you want to prevent  pregnancy, talk to your health care provider about  birth control (contraception). Osteoporosis and menopause  Osteoporosis is a disease in which the bones lose minerals and strength with aging. This can result in serious bone fractures. Your risk for osteoporosis can be identified using a bone density scan.  If you are 53 years of age or older, or if you are at risk for osteoporosis and fractures, ask your health care provider if you should be screened.  Ask your health care provider whether you should take a calcium or vitamin D supplement to lower your risk for osteoporosis.  Menopause may have certain physical symptoms and risks.  Hormone replacement therapy may reduce some of these symptoms and risks. Talk to your health care provider about whether hormone replacement therapy is right for you. Follow these instructions at home:  Schedule regular health, dental, and eye exams.  Stay current with your immunizations.  Do not use any tobacco products including cigarettes, chewing tobacco, or electronic cigarettes.  If you are pregnant, do not drink alcohol.  If you are breastfeeding, limit how much and how often you drink alcohol.  Limit alcohol intake to no more than 1 drink per day for nonpregnant women. One drink equals 12 ounces of beer, 5 ounces of wine, or 1 ounces of hard liquor.  Do not use street drugs.  Do not share needles.  Ask your health care provider for help if you need support or information about quitting drugs.  Tell your health care provider if you often feel depressed.  Tell your health care provider if you have ever been abused or do not feel safe at home. This information is not intended to replace advice given to you by your health care provider. Make sure you discuss any questions you have with your health care provider. Document Released: 10/03/2010 Document Revised: 08/26/2015 Document Reviewed: 12/22/2014 Elsevier Interactive Patient Education  [employer].

## 2017-09-04 NOTE — Patient Instructions (Signed)
BEFORE YOU LEAVE: -labs -finish diabetic form -follow up: 3-4 months  NO MORE cigarettes! [place] gum or lozenge instead.  We have ordered labs or studies at this visit. It can take up to 1-2 weeks for results and processing. IF results require follow up or explanation, we will call you with instructions. Clinically stable results will be released to your [facility]. If you have not heard from [place] or cannot find your results in [facility] in 2 weeks please contact our office at [phone].  If you are not yet signed up for [facility], please consider signing up.

## 2017-09-05 NOTE — Progress Notes (Signed)
Berda Shelvin R Jagjit Riner, DO  

## 2017-09-06 MED ORDER — LEVOTHYROXINE SODIUM 150 MCG PO TABS: 150.0000 ug | ORAL_TABLET | Freq: Every day | ORAL | 0 refills | Status: DC

## 2017-09-06 NOTE — Addendum Note (Signed)
Addended by: Agnes Lawrence on: 09/06/2017 11:08 AM   Modules accepted: Orders

## 2017-09-06 NOTE — Addendum Note (Signed)
Addended by: Agnes Lawrence on: 09/06/2017 11:07 AM   Modules accepted: Orders

## 2017-09-10 ENCOUNTER — Encounter: Payer: Self-pay | Admitting: Adult Health

## 2017-09-24 ENCOUNTER — Encounter: Payer: Self-pay | Admitting: Family Medicine

## 2017-09-25 ENCOUNTER — Encounter: Payer: Self-pay | Admitting: Sports Medicine

## 2017-09-25 ENCOUNTER — Ambulatory Visit (INDEPENDENT_AMBULATORY_CARE_PROVIDER_SITE_OTHER): Payer: Medicare Other

## 2017-09-25 ENCOUNTER — Ambulatory Visit: Payer: Medicare Other | Admitting: Sports Medicine

## 2017-09-25 ENCOUNTER — Other Ambulatory Visit: Payer: Self-pay | Admitting: Sports Medicine

## 2017-09-25 VITALS — BP 157/75 | HR 81 | Resp 16

## 2017-09-25 DIAGNOSIS — M779 Enthesopathy, unspecified: Secondary | ICD-10-CM

## 2017-09-25 DIAGNOSIS — M79671 Pain in right foot: Secondary | ICD-10-CM

## 2017-09-25 DIAGNOSIS — M1000 Idiopathic gout, unspecified site: Secondary | ICD-10-CM | POA: Diagnosis not present

## 2017-09-25 MED ORDER — TRIAMCINOLONE ACETONIDE 10 MG/ML IJ SUSP: 10.0000 mg | Freq: Once | INTRAMUSCULAR | Status: DC

## 2017-09-25 NOTE — Patient Instructions (Signed)

## 2017-09-25 NOTE — Progress Notes (Signed)
Subjective: Paula Stein is a 72 y.o. female patient who presents to office for evaluation of right foot pain. Patient complains of progressive pain especially over the last few months in the top foot on right. Ranks pain 8/10 and is now interferring with daily activities. Patient has tried ice, elevation and biofreeze with no relief in symptoms. Patient denies any other pedal complaints. Denies injury/trip/fall/sprain/any causative factors.   FBS 117 this AM and 6.6.   Review of Systems  Musculoskeletal: Positive for joint pain and myalgias.  All other systems reviewed and are negative.    Patient Active Problem List   Diagnosis Date Noted  . CAD (coronary artery disease) 06/18/2017  . PAD (peripheral artery disease) ([place]) 06/18/2017  . ICD (implantable cardioverter-defibrillator) in place 06/18/2017  . Chronic combined systolic and diastolic CHF (congestive heart failure) ([place]) 05/22/2017  . Hyperlipidemia associated with type 2 diabetes mellitus ([place]) 12/11/2016  . Treatment-emergent central sleep apnea 09/20/2016  . NICM (nonischemic cardiomyopathy) ([place]) 10/08/2014  . Cigarette smoker 10/30/2012  . 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 DeQuervain's disease (tenosynovitis) 09/01/2010  . Atherosclerosis 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 ([place]) 12/07/2008  . Diabetes ([place]) 10/29/2008  . Hypothyroidism 01/31/2007  . Essential hypertension 01/31/2007  . GERD 01/31/2007    Current Outpatient Medications on File Prior to Visit  Medication Sig Dispense Refill  . aspirin 81 MG tablet Take 81 mg by mouth daily.      Marland Kitchen atorvastatin (LIPITOR) 40 MG tablet Take 1.5 tablets (60 mg total) by mouth daily. 135 tablet 3  . carvedilol (COREG) 25 MG tablet Take 1 tablet (25 mg total) by mouth 2 (two) times daily with a meal. 180 tablet 3  . furosemide (LASIX) 40 MG tablet Take 1 tablet (40 mg total) by mouth 2 (two) times daily. 180 tablet 1  . glucose blood  (ONETOUCH VERIO) test strip Use as instructed to check blood sugar once a day 100 each 3  . hydrALAZINE (APRESOLINE) 50 MG tablet Take 1 tablet (50 mg total) by mouth 3 (three) times daily. 90 tablet 0  . isosorbide mononitrate (IMDUR) 30 MG 24 hr tablet take 2 tablet by mouth once daily 180 tablet 3  . levothyroxine (SYNTHROID, LEVOTHROID) 150 MCG tablet Take 1 tablet (150 mcg total) by mouth daily. 90 tablet 0  . metFORMIN (GLUCOPHAGE) 1000 MG tablet Take 1,000 mg by mouth every other day.    . nicotine (NICODERM CQ - DOSED IN MG/24 HR) 7 mg/24hr patch Place 1 patch (7 mg total) onto the skin daily. 28 patch 0  . ONETOUCH DELICA LANCETS 71I MISC 1 Device by Other route daily. 100 each 3  . sacubitril-valsartan (ENTRESTO) 49-51 MG Take 1 tablet by mouth 2 (two) times daily. 60 tablet 10  . spironolactone (ALDACTONE) 50 MG tablet Take 1 tablet (50 mg total) by mouth daily. 30 tablet 11  . zolpidem (AMBIEN) 5 MG tablet Take 1 tablet (5 mg total) by mouth at bedtime as needed for sleep. 30 tablet 0   No current facility-administered medications on file prior to visit.     Allergies  Allergen Reactions  . Ibuprofen Nausea Only    Objective:  General: Alert and oriented x3 in no acute distress  Dermatology: No open lesions bilateral lower extremities, no webspace macerations, no ecchymosis bilateral, all nails x 10 are well manicured.  Vascular: Dorsalis Pedis and Posterior Tibial pedal pulses faintly palpable, Capillary Fill Time 5 seconds,Scant pedal hair growth  bilateral, no edema bilateral lower extremities, Temperature gradient within normal limits but increased at right midfoot extending to ankle.  Neurology: Johney Maine sensation intact via light touch bilateral.   Musculoskeletal: Mild tenderness with palpation at dorsal midfoot on right foot.  Gait: Antalgic gait  Xrays  Right Foot   Impression: Mild midfoot swelling. No other acute findings.  Assessment and Plan: Problem List  Items Addressed This Visit    None    Visit Diagnoses    Idiopathic gout, unspecified chronicity, unspecified site    -  Primary   Relevant Medications   triamcinolone acetonide (KENALOG) 10 MG/ML injection 10 mg   Other Relevant Orders   ANA, IFA Comprehensive Panel   C-reactive protein   Rheumatoid factor   Sedimentation rate   Uric acid   CBC with Differential   Basic Metabolic Panel   Right foot pain       Tendonitis           -Complete examination performed -Xrays reviewed -Discussed treatement options for tendonitis vs gout -Rx Arthritis panel -After oral consent and aseptic prep, injected a mixture containing 1 ml of 2%  plain lidocaine, 1 ml 0.5% plain marcaine, 0.5 ml of kenalog 10 and 0.5 ml of dexamethasone phosphate into right dorsal midfoot without complication. Post-injection care discussed with patient.  -Recommend rest, ice, elevation and compression  -Patient to return to office after bloodwork or sooner if condition worsens.  Landis Martins, DPM

## 2017-09-28 LAB — CBC WITH DIFFERENTIAL/PLATELET
BASOS ABS: 18 {cells}/uL (ref 0–200)
BASOS PCT: 0.2 %
EOS ABS: 9 {cells}/uL — AB (ref 15–500)
Eosinophils Relative: 0.1 %
HCT: 39.8 % (ref 35.0–45.0)
HEMOGLOBIN: 13 g/dL (ref 11.7–15.5)
Lymphs Abs: 2322 cells/uL (ref 850–3900)
MCH: 28.9 pg (ref 27.0–33.0)
MCHC: 32.7 g/dL (ref 32.0–36.0)
MCV: 88.4 fL (ref 80.0–100.0)
MPV: 10.6 fL (ref 7.5–12.5)
Monocytes Relative: 6.3 %
NEUTROS ABS: 6084 {cells}/uL (ref 1500–7800)
Neutrophils Relative %: 67.6 %
Platelets: 294 10*3/uL (ref 140–400)
RBC: 4.5 10*6/uL (ref 3.80–5.10)
RDW: 14 % (ref 11.0–15.0)
Total Lymphocyte: 25.8 %
WBC: 9 10*3/uL (ref 3.8–10.8)
WBCMIX: 567 {cells}/uL (ref 200–950)

## 2017-09-28 LAB — BASIC METABOLIC PANEL
BUN / CREAT RATIO: 15 (calc) (ref 6–22)
BUN: 15 mg/dL (ref 7–25)
CHLORIDE: 109 mmol/L (ref 98–110)
CO2: 23 mmol/L (ref 20–32)
Calcium: 9.4 mg/dL (ref 8.6–10.4)
Creat: 0.99 mg/dL — ABNORMAL HIGH (ref 0.60–0.93)
Glucose, Bld: 107 mg/dL — ABNORMAL HIGH (ref 65–99)
Potassium: 4.3 mmol/L (ref 3.5–5.3)
SODIUM: 140 mmol/L (ref 135–146)

## 2017-09-28 LAB — RHEUMATOID FACTOR: Rhuematoid fact SerPl-aCnc: <14 IU/mL (ref <14)

## 2017-09-28 LAB — ANA, IFA COMPREHENSIVE PANEL
Anti Nuclear Antibody(ANA): POSITIVE — AB
ENA SM Ab Ser-aCnc: <1 AI
SM/RNP: <1 AI
SSA (Ro) (ENA) Antibody, IgG: >8 AI — AB
SSB (LA) (ENA) ANTIBODY, IGG: NEGATIVE AI
Scleroderma (Scl-70) (ENA) Antibody, IgG: <1 AI
ds DNA Ab: <1 IU/mL

## 2017-09-28 LAB — ANTI-NUCLEAR AB-TITER (ANA TITER)

## 2017-09-28 LAB — URIC ACID: Uric Acid, Serum: 6.4 mg/dL (ref 2.5–7.0)

## 2017-09-28 LAB — SEDIMENTATION RATE: Sed Rate: 53 mm/h — ABNORMAL HIGH (ref 0–30)

## 2017-09-28 LAB — C-REACTIVE PROTEIN: CRP: 8.7 mg/L — ABNORMAL HIGH (ref <8.0)

## 2017-10-01 ENCOUNTER — Telehealth: Payer: Self-pay | Admitting: *Deleted

## 2017-10-01 NOTE — Telephone Encounter (Signed)
-----   Message from Landis Martins, [place] sent at 09/28/2017  5:32 PM EDT ----- Val Please call to see how the patient is doing?  Also let patient know that her arthritic panel came back positive with some elevations in her inflammatory markers which could possibly mean that her foot pain could have been related to an underlying arthritic condition.  If patient does not have a rheumatologist please refer her to a rheumatologist for further evaluation for these positive lab findings. -Dr. Cannon Kettle

## 2017-10-01 NOTE — Telephone Encounter (Signed)
I sent over chart notes and lab results as requested to [facility] for rheumatology consult-their facility will contact pt - I also informed pt they will be in touch with her.

## 2017-10-22 ENCOUNTER — Other Ambulatory Visit: Payer: Self-pay | Admitting: Family Medicine

## 2017-10-22 ENCOUNTER — Encounter: Payer: Self-pay | Admitting: Family Medicine

## 2017-10-22 DIAGNOSIS — Z1231 Encounter for screening mammogram for malignant neoplasm of breast: Secondary | ICD-10-CM

## 2017-10-24 ENCOUNTER — Other Ambulatory Visit (INDEPENDENT_AMBULATORY_CARE_PROVIDER_SITE_OTHER): Payer: Medicare Other

## 2017-10-24 DIAGNOSIS — E039 Hypothyroidism, unspecified: Secondary | ICD-10-CM

## 2017-10-24 LAB — TSH: TSH: 2.78 u[IU]/mL (ref 0.35–4.50)

## 2017-10-25 ENCOUNTER — Ambulatory Visit
Admission: RE | Admit: 2017-10-25 | Discharge: 2017-10-25 | Disposition: A | Discharge status: Home / Self Care | Payer: Medicare Other | Source: Ambulatory Visit | Attending: Family Medicine | Admitting: Family Medicine

## 2017-10-25 DIAGNOSIS — Z1231 Encounter for screening mammogram for malignant neoplasm of breast: Secondary | ICD-10-CM

## 2017-11-08 ENCOUNTER — Encounter: Payer: Medicare Other | Admitting: *Deleted

## 2017-11-08 VITALS — BP 152/86 | HR 87 | Wt 199.2 lb

## 2017-11-08 DIAGNOSIS — Z006 Encounter for examination for normal comparison and control in clinical research program: Secondary | ICD-10-CM

## 2017-11-15 NOTE — Progress Notes (Signed)
Subject to research clinic for Visit week 24 in the Galactic HF study.  No cos, aes or saes to report.  Subject did not return all boxes of meds back to the clinic and I could not calculate compliance.  Will try to find 1 box and return it at the next visit.  Next appointment scheduled.

## 2017-11-27 ENCOUNTER — Encounter: Payer: Self-pay | Admitting: Internal Medicine

## 2017-11-29 ENCOUNTER — Ambulatory Visit (INDEPENDENT_AMBULATORY_CARE_PROVIDER_SITE_OTHER): Payer: Medicare Other | Admitting: *Deleted

## 2017-11-29 DIAGNOSIS — I428 Other cardiomyopathies: Secondary | ICD-10-CM

## 2017-11-29 DIAGNOSIS — I5022 Chronic systolic (congestive) heart failure: Secondary | ICD-10-CM

## 2017-11-29 NOTE — Progress Notes (Signed)
Remote ICD transmission.   

## 2017-12-18 ENCOUNTER — Encounter: Payer: Self-pay | Admitting: Internal Medicine

## 2017-12-18 ENCOUNTER — Ambulatory Visit (INDEPENDENT_AMBULATORY_CARE_PROVIDER_SITE_OTHER): Payer: Medicare Other | Admitting: Internal Medicine

## 2017-12-18 VITALS — BP 146/74 | HR 81 | Ht 70.0 in | Wt 204.0 lb

## 2017-12-18 DIAGNOSIS — I428 Other cardiomyopathies: Secondary | ICD-10-CM

## 2017-12-18 DIAGNOSIS — I10 Essential (primary) hypertension: Secondary | ICD-10-CM

## 2017-12-18 DIAGNOSIS — Z95810 Presence of automatic (implantable) cardiac defibrillator: Secondary | ICD-10-CM | POA: Diagnosis not present

## 2017-12-18 LAB — CUP PACEART INCLINIC DEVICE CHECK
Implantable Lead Implant Date: 20160707
Implantable Lead Location: 753858
Implantable Pulse Generator Implant Date: 20160707
MDC IDC SESS DTM: 20190917151310
Pulse Gen Serial Number: 115852

## 2017-12-18 NOTE — Patient Instructions (Signed)
Medication Instructions:  Your physician recommends that you continue on your current medications as directed. Please refer to the Current Medication list given to you today.  Labwork: None ordered.  Testing/Procedures: Your physician has requested that you have an echocardiogram. Echocardiography is a painless test that uses sound waves to create images of your heart. It provides your doctor with information about the size and shape of your heart and how well your heart's chambers and valves are working. This procedure takes approximately one hour. There are no restrictions for this procedure.  **Please schedule Echo for 1 week before your appointment with Dr Burt Knack.**  Follow-Up: Your physician recommends that you schedule a follow-up appointment in:   6 months with Dr Burt Knack 1 Year with Dr [name]  Any Other Special Instructions Will Be Listed Below (If Applicable).     If you need a refill on your cardiac medications before your next appointment, please call your pharmacy.

## 2017-12-18 NOTE — Progress Notes (Signed)
Patient Care Team: Lucretia Kern, DO as PCP - General (Family Medicine) [name], MD as PCP - Cardiology (Cardiology)   HPI  Paula Stein is a 72 y.o. 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 At that point she recently been started on ARB and is on Aldactone. We added nitrates to her hydralazine.  DATE TEST EF   4/16 Echo  30-35 % Severe LVH/LAE  2/19 Echo 25-30% Severe LVH    Date Cr K TSH  12/17 1.02 4.4 6.34  9/18 1.22 4.4 39.32  6/19 0.99 4.3 2.78        She has been gaining weight.  S  She struggles with sleep  The patient denies chest pain, shortness of breath, nocturnal dyspnea, orthopnea or peripheral edema.  There have been no palpitations, lightheadedness or syncope.         Past Medical History:  Diagnosis Date  . AICD (automatic cardioverter/defibrillator) present 10/08/2014   SUBQ    /    DR [name]  . CHF (congestive heart failure) ([place])   . Diabetes mellitus without complication ([place])   . History of cardiac cath 05/2007   normal-with patent coronaries  . History of colonoscopy   . History of hiatal hernia   . Hypertension   . Iatrogenic thyroiditis   . Non-ischemic cardiomyopathy ([place])    EF 28%- reassessment of LV function 2011 with LVEf 45-50%  . PAD (peripheral artery disease) (H[facility])    lower extremities with ABIs of 0.5 bilaterally  . Personal history of goiter   . S/P radioactive iodine thyroid ablation   . S/P thyroidectomy   . Shortness of breath dyspnea     Past Surgical History:  Procedure Laterality Date  . EP IMPLANTABLE DEVICE N/A 10/08/2014   Procedure: SubQ ICD Implant;  Surgeon: [name], MD;  Location: [place] [facility];  Service: Cardiovascular;  Laterality: N/A;  . THYROIDECTOMY    . TONSILLECTOMY    . TOTAL ABDOMINAL HYSTERECTOMY      Current Outpatient  Medications  Medication Sig Dispense Refill  . aspirin 81 MG tablet Take 81 mg by mouth daily.      Marland Kitchen atorvastatin (LIPITOR) 40 MG tablet Take 1.5 tablets (60 mg total) by mouth daily. 135 tablet 3  . carvedilol (COREG) 25 MG tablet Take 1 tablet (25 mg total) by mouth 2 (two) times daily with a meal. 180 tablet 3  . furosemide (LASIX) 40 MG tablet Take 1 tablet (40 mg total) by mouth 2 (two) times daily. 180 tablet 1  . glucose blood (ONETOUCH VERIO) test strip Use as instructed to check blood sugar once a day 100 each 3  . hydrALAZINE (APRESOLINE) 50 MG tablet Take 1 tablet (50 mg total) by mouth 3 (three) times daily. 90 tablet 0  . hydroxychloroquine (PLAQUENIL) 200 MG tablet Take 1 tablet by mouth 2 (two) times daily.  3  . isosorbide mononitrate (IMDUR) 30 MG 24 hr tablet take 2 tablet by mouth once daily 180 tablet 3  . levothyroxine (SYNTHROID, LEVOTHROID) 150 MCG tablet Take 1 tablet (150 mcg total) by mouth daily. 90 tablet 0  . metFORMIN (GLUCOPHAGE) 1000 MG tablet Take 1,000 mg by mouth every other day.    . nicotine (NICODERM CQ - DOSED IN MG/24 HR) 7 mg/24hr patch Place 1 patch (7 mg  total) onto the skin daily. 28 patch 0  . ONETOUCH DELICA LANCETS 48N MISC 1 Device by Other route daily. 100 each 3  . predniSONE (DELTASONE) 10 MG tablet Take 1 tablet by mouth daily.  3  . sacubitril-valsartan (ENTRESTO) 49-51 MG Take 1 tablet by mouth 2 (two) times daily. 60 tablet 10  . spironolactone (ALDACTONE) 50 MG tablet Take 1 tablet (50 mg total) by mouth daily. 30 tablet 11   Current Facility-Administered Medications  Medication Dose Route Frequency Provider Last Rate Last Dose  . triamcinolone acetonide (KENALOG) 10 MG/ML injection 10 mg  10 mg Other Once Landis Martins, DPM        Allergies  Allergen Reactions  . Ibuprofen Nausea Only      Review of Systems negative except from HPI and PMH  Physical Exam BP (!) 146/74   Pulse 81   Ht 5\' 10"  (1.778 m)   Wt 204 lb (92.5 kg)    SpO2 97%   BMI 29.27 kg/m  Well developed and nourished in no acute distress HENT normal Neck supple with JVP-flat Clear Device pocket well healed; without hematoma or erythema.  There is no tethering  Regular rate and rhythm, no murmurs or gallops Abd-soft with active BS No Clubbing cyanosis edema Skin-warm and dry A & Oriented  Grossly normal sensory and motor function   ECG sinus 81 2217/46   Assessment and  Plan   Nonischemic cardiomyopathy  CHF  Chronic systolic  ICD-Subcutaneous The patient's device was interrogated.  The information was reviewed. No changes were made in the programming.     Keloid  Hypertension  Hypothyroid  High Risk Medication Surveillance    Surveillance labs are normal  TSH within range  Sleep disturbance problem  Suggested diphenhydramine and melatonin  Euvolemic continue current meds  BP borderline elevated  Will follow   Will arrange followup with Dr Edd Fabian

## 2017-12-26 LAB — CUP PACEART REMOTE DEVICE CHECK
Date Time Interrogation Session: 20190925152713
Implantable Pulse Generator Implant Date: 20160707
MDC IDC LEAD IMPLANT DT: 20160707
MDC IDC LEAD LOCATION: 753858
Pulse Gen Serial Number: 115852

## 2018-01-01 NOTE — Progress Notes (Signed)
HPI:  Using dictation device. Unfortunately this device frequently misinterprets words/phrases.  Paula Stein is a pleasant 72 y.o. here for follow up. Chronic medical problems summarized below were reviewed for changes and stability and were updated as needed below. These issues and their treatment remain stable for the most part. Seeing podiatry and a number of inflammatory labs done for foot pain - she was referred to rheuamtology for further eval. Reports she is feeling well. Due for thyroid recheck. Still smoking 3 cigarettes per day. Not very motivated to quit. But interested. Lozenges and gum don't work for her. Denies CP, palpitations, SOB, DOE, treatment intolerance or new symptoms. Due for foot exam, flu shot AWV AWV 09/04/17  DM w/ neuropathy: -meds: metformin -only taking once daily - does not tolerate more -has neuropathy in feet, sees podiatrist - wants re-order of her diabetic shoes - has hx pre-ulcer callous  -does eye exam yearly - sees a doctor at [facility] [place]  Hypothyroidism: -meds: synthroid  Hx of Smoking: -quit 05/2017  HTN/CsCHF w/ AICD, cardiomyopathy, HLD, PAD, OSA: -sees cardiologist - on many meds -cards following/managing  -seeing cardiologist regarding her cholesterol  ROS: See pertinent positives and negatives per HPI.  Past Medical History:  Diagnosis Date  . AICD (automatic cardioverter/defibrillator) present 10/08/2014   SUBQ    /    DR [name]  . CHF (congestive heart failure) ([place])   . Diabetes mellitus without complication ([place])   . History of cardiac cath 05/2007   normal-with patent coronaries  . History of colonoscopy   . History of hiatal hernia   . Hypertension   . Iatrogenic thyroiditis   . Non-ischemic cardiomyopathy ([place])    EF 28%- reassessment of LV function 2011 with LVEf 45-50%  . PAD (peripheral artery disease) ([facility])    lower extremities with ABIs of 0.5 bilaterally  . Personal history of goiter   . S/P radioactive iodine  thyroid ablation   . S/P thyroidectomy   . Shortness of breath dyspnea     Past Surgical History:  Procedure Laterality Date  . EP IMPLANTABLE DEVICE N/A 10/08/2014   Procedure: SubQ ICD Implant;  Surgeon: [name], MD;  Location: [place] [facility];  Service: Cardiovascular;  Laterality: N/A;  . THYROIDECTOMY    . TONSILLECTOMY    . TOTAL ABDOMINAL HYSTERECTOMY      Family History  Problem Relation Age of Onset  . Diabetes type II Mother   . Hypertension Mother   . Diabetes Mother   . Heart disease Mother   . Other Father        deceased from accident age 74  . Hyperlipidemia Father     SOCIAL HX: see hpi   Current Outpatient Medications:  .  aspirin 81 MG tablet, Take 81 mg by mouth daily.  , Disp: , Rfl:  .  atorvastatin (LIPITOR) 40 MG tablet, Take 1.5 tablets (60 mg total) by mouth daily., Disp: 135 tablet, Rfl: 3 .  carvedilol (COREG) 25 MG tablet, Take 1 tablet (25 mg total) by mouth 2 (two) times daily with a meal., Disp: 180 tablet, Rfl: 3 .  furosemide (LASIX) 40 MG tablet, Take 1 tablet (40 mg total) by mouth 2 (two) times daily., Disp: 180 tablet, Rfl: 1 .  glucose blood (ONETOUCH VERIO) test strip, Use as instructed to check blood sugar once a day, Disp: 100 each, Rfl: 3 .  hydrALAZINE (APRESOLINE) 50 MG tablet, Take 1 tablet (50 mg total) by mouth 3 (  three) times daily., Disp: 90 tablet, Rfl: 0 .  hydroxychloroquine (PLAQUENIL) 200 MG tablet, Take 1 tablet by mouth 2 (two) times daily., Disp: , Rfl: 3 .  isosorbide mononitrate (IMDUR) 30 MG 24 hr tablet, take 2 tablet by mouth once daily, Disp: 180 tablet, Rfl: 3 .  levothyroxine (SYNTHROID, LEVOTHROID) 150 MCG tablet, Take 1 tablet (150 mcg total) by mouth daily., Disp: 90 tablet, Rfl: 0 .  metFORMIN (GLUCOPHAGE) 1000 MG tablet, Take 1,000 mg by mouth every other day., Disp: , Rfl:  .  nicotine (NICODERM CQ - DOSED IN MG/24 HR) 7 mg/24hr patch, Place 1 patch (7 mg total) onto the skin daily., Disp: 28 patch,  Rfl: 0 .  ONETOUCH DELICA LANCETS 51Z MISC, 1 Device by Other route daily., Disp: 100 each, Rfl: 3 .  predniSONE (DELTASONE) 10 MG tablet, Take 1 tablet by mouth daily., Disp: , Rfl: 3 .  sacubitril-valsartan (ENTRESTO) 49-51 MG, Take 1 tablet by mouth 2 (two) times daily., Disp: 60 tablet, Rfl: 10 .  spironolactone (ALDACTONE) 50 MG tablet, Take 1 tablet (50 mg total) by mouth daily., Disp: 30 tablet, Rfl: 11  Current Facility-Administered Medications:  .  triamcinolone acetonide (KENALOG) 10 MG/ML injection 10 mg, 10 mg, Other, Once, [place], [place], DPM  EXAM:  Vitals:   01/03/18 0743  BP: 100/64  Pulse: 76  Temp: 97.8 F (36.6 C)    Body mass index is 28.64 kg/m.  GENERAL: vitals reviewed and listed above, alert, oriented, appears well hydrated and in no acute distress  HEENT: atraumatic, conjunttiva clear, no obvious abnormalities on inspection of external nose and ears  NECK: no obvious masses on inspection  LUNGS: clear to auscultation bilaterally, no wheezes, rales or rhonchi, good air movement  CV: HRRR, no peripheral edema  MS: moves all extremities without noticeable abnormality  PSYCH: pleasant and cooperative, no obvious depression or anxiety  ASSESSMENT AND PLAN:  Discussed the following assessment and plan:  Coronary artery disease involving native coronary artery of native heart without angina pectoris  Hyperlipidemia associated with type 2 diabetes mellitus (HCC)  Chronic combined systolic and diastolic CHF (congestive heart failure) (HCC)  Cigarette smoker  Atherosclerosis of native artery of lower extremity with intermittent claudication, unspecified laterality (HCC)  PAD (peripheral artery disease) (HCC)  NICM (nonischemic cardiomyopathy) ([place])  -smoking cessation counseling 3-5 minutes, she may try cinnamon toothpicks -recheck TSH-flu shot today -foot exam done -cont tx o/w pending lab -follow up 3-4 months   Patient Instructions   BEFORE YOU LEAVE: -flu shot -lab for thyroid check -follow up: 3-4 months  We have ordered labs or studies at this visit. It can take up to 1-2 weeks for results and processing. IF results require follow up or explanation, we will call you with instructions. Clinically stable results will be released to your [facility]. If you have not heard from [place] or cannot find your results in [facility] in 2 weeks please contact our office at [phone].  If you are not yet signed up for [facility], please consider signing up.   We recommend the following healthy lifestyle for LIFE: 1) Small portions. But, make sure to get regular (at least 3 per day), healthy meals and small healthy snacks if needed.  2) Eat a healthy clean diet.   TRY TO EAT: -at least 5-7 servings of low sugar, colorful, and nutrient rich vegetables per day (not corn, potatoes or bananas.) -berries are the best choice if you wish to eat fruit (only eat small  amounts if trying to reduce weight)  -lean meets (fish, white meat of chicken or [place]) -vegan proteins for some meals - beans or tofu, whole grains, nuts and seeds -Replace bad fats with good fats - good fats include: fish, nuts and seeds, canola oil, olive oil -small amounts of low fat or non fat dairy -small amounts of100 % whole grains - check the lables -drink plenty of water  AVOID: -SUGAR, sweets, anything with added sugar, corn syrup or sweeteners - must read labels as even foods advertised as "healthy" often are loaded with sugar -if you must have a sweetener, small amounts of stevia may be best -sweetened beverages and artificially sweetened beverages -simple starches (rice, bread, potatoes, pasta, chips, etc - small amounts of 100% whole grains are ok) -red meat, pork, butter -fried foods, fast food, processed food, excessive dairy, eggs and coconut.  3)Get at least 150 minutes of sweaty aerobic exercise per week.  4)Reduce stress - consider counseling, meditation  and relaxation to balance other aspects of your life.          Lucretia Kern, DO

## 2018-01-03 ENCOUNTER — Ambulatory Visit: Payer: Medicare Other | Admitting: Family Medicine

## 2018-01-03 ENCOUNTER — Encounter: Payer: Self-pay | Admitting: Family Medicine

## 2018-01-03 VITALS — BP 100/64 | HR 76 | Temp 97.8°F | Ht 70.0 in | Wt 199.6 lb

## 2018-01-03 DIAGNOSIS — E039 Hypothyroidism, unspecified: Secondary | ICD-10-CM | POA: Diagnosis not present

## 2018-01-03 DIAGNOSIS — F17210 Nicotine dependence, cigarettes, uncomplicated: Secondary | ICD-10-CM

## 2018-01-03 DIAGNOSIS — I2510 Atherosclerotic heart disease of native coronary artery without angina pectoris: Secondary | ICD-10-CM | POA: Diagnosis not present

## 2018-01-03 DIAGNOSIS — E785 Hyperlipidemia, unspecified: Secondary | ICD-10-CM

## 2018-01-03 DIAGNOSIS — Z23 Encounter for immunization: Secondary | ICD-10-CM | POA: Diagnosis not present

## 2018-01-03 DIAGNOSIS — I739 Peripheral vascular disease, unspecified: Secondary | ICD-10-CM

## 2018-01-03 DIAGNOSIS — E1169 Type 2 diabetes mellitus with other specified complication: Secondary | ICD-10-CM | POA: Diagnosis not present

## 2018-01-03 DIAGNOSIS — I70219 Atherosclerosis of native arteries of extremities with intermittent claudication, unspecified extremity: Secondary | ICD-10-CM

## 2018-01-03 DIAGNOSIS — I5042 Chronic combined systolic (congestive) and diastolic (congestive) heart failure: Secondary | ICD-10-CM

## 2018-01-03 DIAGNOSIS — I428 Other cardiomyopathies: Secondary | ICD-10-CM

## 2018-01-03 LAB — TSH: TSH: 6.28 u[IU]/mL — ABNORMAL HIGH (ref 0.35–4.50)

## 2018-01-03 NOTE — Addendum Note (Signed)
Addended by: Agnes Lawrence on: 01/03/2018 08:24 AM   Modules accepted: Orders

## 2018-01-03 NOTE — Addendum Note (Signed)
Addended by: Elmer Picker on: 01/03/2018 08:35 AM   Modules accepted: Orders

## 2018-01-03 NOTE — Patient Instructions (Signed)
BEFORE YOU LEAVE: -flu shot -lab for thyroid check -follow up: 3-4 months  We have ordered labs or studies at this visit. It can take up to 1-2 weeks for results and processing. IF results require follow up or explanation, we will call you with instructions. Clinically stable results will be released to your [facility]. If you have not heard from [place] or cannot find your results in [facility] in 2 weeks please contact our office at [phone].  If you are not yet signed up for [facility], please consider signing up.   We recommend the following healthy lifestyle for LIFE: 1) Small portions. But, make sure to get regular (at least 3 per day), healthy meals and small healthy snacks if needed.  2) Eat a healthy clean diet.   TRY TO EAT: -at least 5-7 servings of low sugar, colorful, and nutrient rich vegetables per day (not corn, potatoes or bananas.) -berries are the best choice if you wish to eat fruit (only eat small amounts if trying to reduce weight)  -lean meets (fish, white meat of chicken or [place]) -vegan proteins for some meals - beans or tofu, whole grains, nuts and seeds -Replace bad fats with good fats - good fats include: fish, nuts and seeds, canola oil, olive oil -small amounts of low fat or non fat dairy -small amounts of100 % whole grains - check the lables -drink plenty of water  AVOID: -SUGAR, sweets, anything with added sugar, corn syrup or sweeteners - must read labels as even foods advertised as "healthy" often are loaded with sugar -if you must have a sweetener, small amounts of stevia may be best -sweetened beverages and artificially sweetened beverages -simple starches (rice, bread, potatoes, pasta, chips, etc - small amounts of 100% whole grains are ok) -red meat, pork, butter -fried foods, fast food, processed food, excessive dairy, eggs and coconut.  3)Get at least 150 minutes of sweaty aerobic exercise per week.  4)Reduce stress - consider counseling, meditation  and relaxation to balance other aspects of your life.

## 2018-01-04 NOTE — Addendum Note (Signed)
Addended by: Agnes Lawrence on: 01/04/2018 09:31 AM   Modules accepted: Orders

## 2018-02-14 ENCOUNTER — Other Ambulatory Visit: Payer: Self-pay | Admitting: Family Medicine

## 2018-02-14 ENCOUNTER — Encounter: Payer: Medicare Other | Admitting: *Deleted

## 2018-02-14 VITALS — BP 157/94 | HR 87 | Resp 18 | Wt 196.4 lb

## 2018-02-14 DIAGNOSIS — Z006 Encounter for examination for normal comparison and control in clinical research program: Secondary | ICD-10-CM

## 2018-02-14 NOTE — Research (Signed)
Subject to research clinic for visit Week 36 in the [employer] HF trial.  No cos, aes or saes to report. New med dispensed and next appointment scheduled.

## 2018-02-15 ENCOUNTER — Ambulatory Visit: Payer: Medicare Other | Admitting: Cardiovascular Disease

## 2018-02-15 ENCOUNTER — Encounter: Payer: Self-pay | Admitting: Cardiovascular Disease

## 2018-02-15 VITALS — BP 126/80 | HR 85 | Ht 70.0 in | Wt 199.4 lb

## 2018-02-15 DIAGNOSIS — I739 Peripheral vascular disease, unspecified: Secondary | ICD-10-CM

## 2018-02-15 DIAGNOSIS — I5022 Chronic systolic (congestive) heart failure: Secondary | ICD-10-CM

## 2018-02-15 DIAGNOSIS — I10 Essential (primary) hypertension: Secondary | ICD-10-CM | POA: Diagnosis not present

## 2018-02-15 DIAGNOSIS — E782 Mixed hyperlipidemia: Secondary | ICD-10-CM | POA: Diagnosis not present

## 2018-02-15 MED ORDER — ISOSORBIDE MONONITRATE ER 30 MG PO TB24: 30.0000 mg | ORAL_TABLET | Freq: Every day | ORAL | 3 refills | Status: DC

## 2018-02-15 MED ORDER — SPIRONOLACTONE 50 MG PO TABS: 50.0000 mg | ORAL_TABLET | Freq: Every day | ORAL | 3 refills | Status: DC

## 2018-02-15 NOTE — Patient Instructions (Signed)
Medication Instructions:  Your provider recommends that you continue on your current medications as directed. Please refer to the Current Medication list given to you today.    Labwork: None  Testing/Procedures: None  Follow-Up: You have an appointment with Dr. Antionette Char assistant, Richardson Dopp, on Aug 20, 2018 at 8:15AM.

## 2018-02-15 NOTE — Progress Notes (Signed)
Cardiology Office Note:    Date:  02/15/2018   ID:  Paula Stein, DOB 1945/11/10, MRN [id number]  PCP:  Lucretia Kern, DO  Cardiologist:  [name], MD  Electrophysiologist:  None   Referring MD: Lucretia Kern, DO   Chief Complaint  Patient presents with  . Shortness of Breath   History of Present Illness:    Paula Stein is a 72 y.o. female with a hx of chronic systolic heart failure secondary to underlying nonischemic cardiomyopathy.  Cardiac catheterization is demonstrated mild nonobstructive CAD.  She has undergone ICD implantation in 2016.  She was hospitalized in February 2019 with decompensated heart failure.  She is been noted to have malignant hypertension over time and her blood pressure was markedly elevated at the time of her hospitalization.  The patient is here alone today.  She is been doing well.  She denies any recent symptoms of chest pain, orthopnea, or PND.  She has mild exertional dyspnea but overall feels she is doing well.  She has run out of isosorbide and Spironolactone.  Past Medical History:  Diagnosis Date  . AICD (automatic cardioverter/defibrillator) present 10/08/2014   SUBQ    /    DR [name]  . CHF (congestive heart failure) ([place])   . Diabetes mellitus without complication ([place])   . History of cardiac cath 05/2007   normal-with patent coronaries  . History of colonoscopy   . History of hiatal hernia   . Hypertension   . Iatrogenic thyroiditis   . Non-ischemic cardiomyopathy ([place])    EF 28%- reassessment of LV function 2011 with LVEf 45-50%  . PAD (peripheral artery disease) ([facility])    lower extremities with ABIs of 0.5 bilaterally  . Personal history of goiter   . S/P radioactive iodine thyroid ablation   . S/P thyroidectomy   . Shortness of breath dyspnea     Past Surgical History:  Procedure Laterality Date  . EP IMPLANTABLE DEVICE N/A 10/08/2014   Procedure: SubQ ICD Implant;  Surgeon: [name], MD;  Location: [facility];   Service: Cardiovascular;  Laterality: N/A;  . THYROIDECTOMY    . TONSILLECTOMY    . TOTAL ABDOMINAL HYSTERECTOMY      Current Medications: Current Meds  Medication Sig  . aspirin 81 MG tablet Take 81 mg by mouth daily.    Marland Kitchen atorvastatin (LIPITOR) 40 MG tablet Take 1.5 tablets (60 mg total) by mouth daily.  . carvedilol (COREG) 25 MG tablet Take 1 tablet (25 mg total) by mouth 2 (two) times daily with a meal.  . furosemide (LASIX) 40 MG tablet Take 1 tablet (40 mg total) by mouth 2 (two) times daily.  Marland Kitchen glucose blood (ONETOUCH VERIO) test strip Use as instructed to check blood sugar once a day  . hydrALAZINE (APRESOLINE) 50 MG tablet Take 1 tablet (50 mg total) by mouth 3 (three) times daily.  . hydroxychloroquine (PLAQUENIL) 200 MG tablet Take 1 tablet by mouth 2 (two) times daily.  Marland Kitchen levothyroxine (SYNTHROID, LEVOTHROID) 150 MCG tablet TAKE 1 TABLET(150 MCG) BY MOUTH DAILY  . metFORMIN (GLUCOPHAGE) 1000 MG tablet Take 1,000 mg by mouth every other day.  Glory Rosebush DELICA LANCETS 12X MISC 1 Device by Other route daily.  . predniSONE (DELTASONE) 10 MG tablet Take 1 tablet by mouth daily.  . sacubitril-valsartan (ENTRESTO) 49-51 MG Take 1 tablet by mouth 2 (two) times daily.   Current Facility-Administered Medications for the 02/15/18 encounter (Office Visit) with  [name], MD  Medication  . triamcinolone acetonide (KENALOG) 10 MG/ML injection 10 mg     Allergies:   Ibuprofen   Social History   Socioeconomic History  . Marital status: Divorced    Spouse name: Not on file  . Number of children: 1  . Years of education: Not on file  . Highest education level: Not on file  Occupational History  . Occupation: Retired    Fish farm manager: RETIRED    CommentAdvertising copywriter  Social Needs  . Financial resource strain: Not on file  . Food insecurity:    Worry: Not on file    Inability: Not on file  . Transportation needs:    Medical: Not on file    Non-medical: Not on file    Tobacco Use  . Smoking status: Current Some Day Smoker    Packs/day: 0.25    Years: 40.00    Pack years: 10.00    Types: Cigarettes  . Smokeless tobacco: Never Used  . Tobacco comment: "This was the final straw"; quit x 1; may quit if she had the patch   Substance and Sexual Activity  . Alcohol use: Yes    Alcohol/week: 0.0 standard drinks    Comment: rare glass of wine  . Drug use: No  . Sexual activity: Not Currently  Lifestyle  . Physical activity:    Days per week: Not on file    Minutes per session: Not on file  . Stress: Not on file  Relationships  . Social connections:    Talks on phone: Not on file    Gets together: Not on file    Attends religious service: Not on file    Active member of club or organization: Not on file    Attends meetings of clubs or organizations: Not on file    Relationship status: Not on file  Other Topics Concern  . Not on file  Social History Narrative   Retired Education officer, museum   Divorced - one grown son   current smoker    Alcohol use-no      Drug use-no    Regular exercise- no     son - Anndrea Mihelich     Family History: The patient's family history includes Diabetes in her mother; Diabetes type II in her mother; Heart disease in her mother; Hyperlipidemia in her father; Hypertension in her mother; Other in her father.  ROS:   Please see the history of present illness.    Orthopnea/PND (none recently), insomnia. All other systems reviewed and are negative.  EKGs/Labs/Other Studies Reviewed:    The following studies were reviewed today: 2D echocardiogram May 22, 2017: Study Conclusions  - Left ventricle: The cavity size was normal. There was severe   concentric hypertrophy. Systolic function was severely reduced.   The estimated ejection fraction was in the range of 25% to 30%.   Diffuse hypokinesis. Doppler parameters are consistent with   abnormal left ventricular relaxation (grade 1 diastolic   dysfunction). Doppler  parameters are consistent with   indeterminate ventricular filling pressure. - Aortic valve: Transvalvular velocity was within the normal range.   There was no stenosis. There was no regurgitation. - Mitral valve: Transvalvular velocity was within the normal range.   There was no evidence for stenosis. There was mild regurgitation. - Left atrium: The atrium was severely dilated. - Right ventricle: The cavity size was normal. Wall thickness was   normal. Systolic function was reduced. - Atrial septum: No defect  or patent foramen ovale was identified. - Tricuspid valve: There was trivial regurgitation. - Pulmonary arteries: Systolic pressure was within the normal   range. PA peak pressure: 24 mm Hg (S). - Pericardium, extracardiac: A trivial pericardial effusion was   identified.  EKG:  EKG is not ordered today.    Recent Labs: 05/22/2017: B Natriuretic Peptide 515.6 05/24/2017: ALT 11 09/26/2017: BUN 15; Creat 0.99; Hemoglobin 13.0; Platelets 294; Potassium 4.3; Sodium 140 01/03/2018: TSH 6.28  Recent Lipid Panel    Component Value Date/Time   CHOL 234 (H) 04/13/2017 0918   TRIG 122.0 04/13/2017 0918   HDL 67.00 04/13/2017 0918   CHOLHDL 3 04/13/2017 0918   VLDL 24.4 04/13/2017 0918   LDLCALC 142 (H) 04/13/2017 0918   LDLDIRECT 195.2 02/15/2011 1625    Physical Exam:    VS:  BP 126/80   Pulse 85   Ht 5\' 10"  (1.778 m)   Wt 199 lb 6.4 oz (90.4 kg)   SpO2 95%   BMI 28.61 kg/m     Wt Readings from Last 3 Encounters:  02/15/18 199 lb 6.4 oz (90.4 kg)  02/14/18 196 lb 6.4 oz (89.1 kg)  01/03/18 199 lb 9.6 oz (90.5 kg)     GEN:  Well nourished, well developed in no acute distress HEENT: Normal NECK: No JVD; No carotid bruits LYMPHATICS: No lymphadenopathy CARDIAC: RRR, no murmurs, rubs, gallops RESPIRATORY:  Clear to auscultation without rales, wheezing or rhonchi  ABDOMEN: Soft, non-tender, non-distended MUSCULOSKELETAL:  No edema; No deformity  SKIN: Warm and  dry NEUROLOGIC:  Alert and oriented x 3 PSYCHIATRIC:  Normal affect   ASSESSMENT:    1. Chronic systolic heart failure ([place])   2. Essential hypertension   3. Hyperlipidemia   4. PVD (peripheral vascular disease) ([place])    PLAN:    In order of problems listed above:  1. The patient appears stable and euvolemic on exam.  She has [place] Heart Association functional class II symptoms.  We discussed the continued importance of sodium restriction and daily weights.  We also reviewed the importance of medication compliance.  Her prescriptions for isosorbide and Spironolactone are updated.  She otherwise has been taking her medicines as prescribed. 2. Blood pressure is now well controlled on multidrug therapy with carvedilol, hydralazine, isosorbide, Entresto, and Spironolactone. 3. Treated with a high intensity statin drug using atorvastatin 60 mg daily. 4. Stable without symptoms of claudication.   Medication Adjustments/Labs and Tests Ordered: Current medicines are reviewed at length with the patient today.  Concerns regarding medicines are outlined above.  No orders of the defined types were placed in this encounter.  No orders of the defined types were placed in this encounter.   Patient Instructions  Medication Instructions:  Your provider recommends that you continue on your current medications as directed. Please refer to the Current Medication list given to you today.    Labwork: None  Testing/Procedures: None  Follow-Up: You have an appointment with Dr. Antionette Char assistant, Richardson Dopp, on Aug 20, 2018 at 8:15AM.    Signed, [name], MD  02/15/2018 5:25 PM    [place]

## 2018-02-20 ENCOUNTER — Other Ambulatory Visit (INDEPENDENT_AMBULATORY_CARE_PROVIDER_SITE_OTHER): Payer: Medicare Other

## 2018-02-20 DIAGNOSIS — E039 Hypothyroidism, unspecified: Secondary | ICD-10-CM

## 2018-02-20 LAB — TSH: TSH: 4.51 u[IU]/mL — AB (ref 0.35–4.50)

## 2018-03-06 ENCOUNTER — Ambulatory Visit (INDEPENDENT_AMBULATORY_CARE_PROVIDER_SITE_OTHER): Payer: Medicare Other

## 2018-03-06 DIAGNOSIS — I428 Other cardiomyopathies: Secondary | ICD-10-CM

## 2018-03-06 NOTE — Progress Notes (Signed)
Remote ICD transmission.   

## 2018-03-12 ENCOUNTER — Encounter: Payer: Self-pay | Admitting: Cardiology

## 2018-03-13 ENCOUNTER — Encounter (HOSPITAL_COMMUNITY): Payer: Self-pay | Admitting: Emergency Medicine

## 2018-03-13 ENCOUNTER — Other Ambulatory Visit: Payer: Self-pay

## 2018-03-13 ENCOUNTER — Ambulatory Visit (HOSPITAL_BASED_OUTPATIENT_CLINIC_OR_DEPARTMENT_OTHER): Payer: Medicare Other

## 2018-03-13 ENCOUNTER — Emergency Department (HOSPITAL_COMMUNITY): Payer: Medicare Other

## 2018-03-13 ENCOUNTER — Observation Stay (HOSPITAL_COMMUNITY)
Admission: EM | Admit: 2018-03-13 | Discharge: 2018-03-14 | Disposition: A | Discharge status: Home / Self Care | Payer: Medicare Other | Attending: Internal Medicine | Admitting: Internal Medicine

## 2018-03-13 DIAGNOSIS — E1151 Type 2 diabetes mellitus with diabetic peripheral angiopathy without gangrene: Secondary | ICD-10-CM | POA: Diagnosis not present

## 2018-03-13 DIAGNOSIS — E1169 Type 2 diabetes mellitus with other specified complication: Secondary | ICD-10-CM | POA: Diagnosis present

## 2018-03-13 DIAGNOSIS — J9811 Atelectasis: Secondary | ICD-10-CM | POA: Diagnosis not present

## 2018-03-13 DIAGNOSIS — I5020 Unspecified systolic (congestive) heart failure: Secondary | ICD-10-CM | POA: Diagnosis present

## 2018-03-13 DIAGNOSIS — Z79899 Other long term (current) drug therapy: Secondary | ICD-10-CM | POA: Insufficient documentation

## 2018-03-13 DIAGNOSIS — E785 Hyperlipidemia, unspecified: Secondary | ICD-10-CM | POA: Insufficient documentation

## 2018-03-13 DIAGNOSIS — Z8249 Family history of ischemic heart disease and other diseases of the circulatory system: Secondary | ICD-10-CM | POA: Insufficient documentation

## 2018-03-13 DIAGNOSIS — Z95810 Presence of automatic (implantable) cardiac defibrillator: Secondary | ICD-10-CM | POA: Diagnosis not present

## 2018-03-13 DIAGNOSIS — R2681 Unsteadiness on feet: Secondary | ICD-10-CM | POA: Insufficient documentation

## 2018-03-13 DIAGNOSIS — G4733 Obstructive sleep apnea (adult) (pediatric): Secondary | ICD-10-CM | POA: Diagnosis not present

## 2018-03-13 DIAGNOSIS — I340 Nonrheumatic mitral (valve) insufficiency: Secondary | ICD-10-CM | POA: Diagnosis not present

## 2018-03-13 DIAGNOSIS — R0602 Shortness of breath: Secondary | ICD-10-CM | POA: Diagnosis present

## 2018-03-13 DIAGNOSIS — E119 Type 2 diabetes mellitus without complications: Secondary | ICD-10-CM

## 2018-03-13 DIAGNOSIS — I700 Atherosclerosis of aorta: Secondary | ICD-10-CM | POA: Insufficient documentation

## 2018-03-13 DIAGNOSIS — F17210 Nicotine dependence, cigarettes, uncomplicated: Secondary | ICD-10-CM | POA: Diagnosis not present

## 2018-03-13 DIAGNOSIS — E890 Postprocedural hypothyroidism: Secondary | ICD-10-CM | POA: Insufficient documentation

## 2018-03-13 DIAGNOSIS — I5043 Acute on chronic combined systolic (congestive) and diastolic (congestive) heart failure: Secondary | ICD-10-CM | POA: Diagnosis not present

## 2018-03-13 DIAGNOSIS — J90 Pleural effusion, not elsewhere classified: Secondary | ICD-10-CM | POA: Diagnosis not present

## 2018-03-13 DIAGNOSIS — Z7982 Long term (current) use of aspirin: Secondary | ICD-10-CM | POA: Diagnosis not present

## 2018-03-13 DIAGNOSIS — I428 Other cardiomyopathies: Secondary | ICD-10-CM | POA: Insufficient documentation

## 2018-03-13 DIAGNOSIS — Z79890 Hormone replacement therapy (postmenopausal): Secondary | ICD-10-CM | POA: Diagnosis not present

## 2018-03-13 DIAGNOSIS — I110 Hypertensive heart disease with heart failure: Principal | ICD-10-CM | POA: Insufficient documentation

## 2018-03-13 DIAGNOSIS — I509 Heart failure, unspecified: Secondary | ICD-10-CM | POA: Diagnosis present

## 2018-03-13 DIAGNOSIS — I10 Essential (primary) hypertension: Secondary | ICD-10-CM | POA: Diagnosis present

## 2018-03-13 DIAGNOSIS — E039 Hypothyroidism, unspecified: Secondary | ICD-10-CM | POA: Diagnosis present

## 2018-03-13 DIAGNOSIS — G4731 Primary central sleep apnea: Secondary | ICD-10-CM

## 2018-03-13 LAB — GLUCOSE, CAPILLARY
GLUCOSE-CAPILLARY: 128 mg/dL — AB (ref 70–99)
GLUCOSE-CAPILLARY: 168 mg/dL — AB (ref 70–99)
Glucose-Capillary: 96 mg/dL (ref 70–99)

## 2018-03-13 LAB — CBC
HCT: 40.3 % (ref 36.0–46.0)
HEMOGLOBIN: 12 g/dL (ref 12.0–15.0)
MCH: 29 pg (ref 26.0–34.0)
MCHC: 29.8 g/dL — AB (ref 30.0–36.0)
MCV: 97.3 fL (ref 80.0–100.0)
Platelets: 257 10*3/uL (ref 150–400)
RBC: 4.14 MIL/uL (ref 3.87–5.11)
RDW: 14.9 % (ref 11.5–15.5)
WBC: 6.8 10*3/uL (ref 4.0–10.5)
nRBC: 0 % (ref 0.0–0.2)

## 2018-03-13 LAB — I-STAT TROPONIN, ED: Troponin i, poc: 0.03 ng/mL (ref 0.00–0.08)

## 2018-03-13 LAB — BASIC METABOLIC PANEL
ANION GAP: 9 (ref 5–15)
BUN: 20 mg/dL (ref 8–23)
CHLORIDE: 109 mmol/L (ref 98–111)
CO2: 24 mmol/L (ref 22–32)
Calcium: 9.2 mg/dL (ref 8.9–10.3)
Creatinine, Ser: 1.45 mg/dL — ABNORMAL HIGH (ref 0.44–1.00)
GFR calc Af Amer: 41 mL/min — ABNORMAL LOW (ref >60)
GFR calc non Af Amer: 35 mL/min — ABNORMAL LOW (ref >60)
Glucose, Bld: 131 mg/dL — ABNORMAL HIGH (ref 70–99)
Potassium: 4.6 mmol/L (ref 3.5–5.1)
Sodium: 142 mmol/L (ref 135–145)

## 2018-03-13 LAB — ECHOCARDIOGRAM COMPLETE
Height: 70.5 in
Weight: 3136 oz

## 2018-03-13 LAB — BRAIN NATRIURETIC PEPTIDE: B Natriuretic Peptide: 1050.7 pg/mL — ABNORMAL HIGH (ref 0.0–100.0)

## 2018-03-13 LAB — TSH: TSH: 4.651 u[IU]/mL — ABNORMAL HIGH (ref 0.350–4.500)

## 2018-03-13 LAB — HEMOGLOBIN A1C
Hgb A1c MFr Bld: 6.2 % — ABNORMAL HIGH (ref 4.8–5.6)
Mean Plasma Glucose: 131.24 mg/dL

## 2018-03-13 LAB — T4, FREE: FREE T4: 0.86 ng/dL (ref 0.82–1.77)

## 2018-03-13 MED ORDER — SACUBITRIL-VALSARTAN 49-51 MG PO TABS
1.0000 | ORAL_TABLET | Freq: Two times a day (BID) | ORAL | Status: DC
  Administered 2018-03-13 – 2018-03-14 (×3): 1 via ORAL
  Filled 2018-03-13 (×4): qty 1

## 2018-03-13 MED ORDER — SPIRONOLACTONE 50 MG PO TABS
50.0000 mg | ORAL_TABLET | Freq: Every day | ORAL | Status: DC
  Administered 2018-03-13 – 2018-03-14 (×2): 50 mg via ORAL
  Filled 2018-03-13 (×2): qty 1

## 2018-03-13 MED ORDER — CARVEDILOL 12.5 MG PO TABS
25.0000 mg | ORAL_TABLET | Freq: Two times a day (BID) | ORAL | Status: DC
  Administered 2018-03-13 – 2018-03-14 (×2): 25 mg via ORAL
  Filled 2018-03-13 (×3): qty 2

## 2018-03-13 MED ORDER — ACETAMINOPHEN 325 MG PO TABS: 650.0000 mg | ORAL_TABLET | ORAL | Status: DC | PRN

## 2018-03-13 MED ORDER — HYDROXYCHLOROQUINE SULFATE 200 MG PO TABS
200.0000 mg | ORAL_TABLET | Freq: Two times a day (BID) | ORAL | Status: DC
  Administered 2018-03-13 – 2018-03-14 (×2): 200 mg via ORAL
  Filled 2018-03-13 (×3): qty 1

## 2018-03-13 MED ORDER — LEVOTHYROXINE SODIUM 150 MCG PO TABS
150.0000 ug | ORAL_TABLET | ORAL | Status: DC
  Administered 2018-03-14: 150 ug via ORAL
  Filled 2018-03-13: qty 1

## 2018-03-13 MED ORDER — SODIUM CHLORIDE 0.9% FLUSH
3.0000 mL | Freq: Two times a day (BID) | INTRAVENOUS | Status: DC
  Administered 2018-03-13 – 2018-03-14 (×3): 3 mL via INTRAVENOUS

## 2018-03-13 MED ORDER — SODIUM CHLORIDE 0.9% FLUSH: 3.0000 mL | INTRAVENOUS | Status: DC | PRN

## 2018-03-13 MED ORDER — PREDNISONE 20 MG PO TABS
10.0000 mg | ORAL_TABLET | Freq: Every day | ORAL | Status: DC
Start: 2018-03-13 — End: 2018-03-14
  Administered 2018-03-13 – 2018-03-14 (×2): 10 mg via ORAL
  Filled 2018-03-13 (×2): qty 0.5

## 2018-03-13 MED ORDER — SODIUM CHLORIDE 0.9 % IV SOLN: 250.0000 mL | INTRAVENOUS | Status: DC | PRN

## 2018-03-13 MED ORDER — ATORVASTATIN CALCIUM 40 MG PO TABS
60.0000 mg | ORAL_TABLET | Freq: Every day | ORAL | Status: DC
  Administered 2018-03-13 – 2018-03-14 (×2): 60 mg via ORAL
  Filled 2018-03-13 (×2): qty 2

## 2018-03-13 MED ORDER — FUROSEMIDE 10 MG/ML IJ SOLN
60.0000 mg | Freq: Two times a day (BID) | INTRAMUSCULAR | Status: DC
  Administered 2018-03-13 – 2018-03-14 (×2): 60 mg via INTRAVENOUS
  Filled 2018-03-13 (×2): qty 6

## 2018-03-13 MED ORDER — ENOXAPARIN SODIUM 40 MG/0.4ML ~~LOC~~ SOLN
40.0000 mg | SUBCUTANEOUS | Status: DC
  Administered 2018-03-13: 40 mg via SUBCUTANEOUS
  Filled 2018-03-13 (×2): qty 0.4

## 2018-03-13 MED ORDER — ASPIRIN EC 81 MG PO TBEC
81.0000 mg | DELAYED_RELEASE_TABLET | Freq: Every day | ORAL | Status: DC
  Administered 2018-03-13 – 2018-03-14 (×2): 81 mg via ORAL
  Filled 2018-03-13 (×2): qty 1

## 2018-03-13 MED ORDER — ISOSORBIDE MONONITRATE ER 30 MG PO TB24
30.0000 mg | ORAL_TABLET | Freq: Every day | ORAL | Status: DC
  Administered 2018-03-13 – 2018-03-14 (×2): 30 mg via ORAL
  Filled 2018-03-13 (×2): qty 1

## 2018-03-13 MED ORDER — INSULIN ASPART 100 UNIT/ML ~~LOC~~ SOLN: 0.0000 [IU] | Freq: Every day | SUBCUTANEOUS | Status: DC

## 2018-03-13 MED ORDER — INSULIN ASPART 100 UNIT/ML ~~LOC~~ SOLN
0.0000 [IU] | Freq: Three times a day (TID) | SUBCUTANEOUS | Status: DC
  Administered 2018-03-13 – 2018-03-14 (×2): 2 [IU] via SUBCUTANEOUS

## 2018-03-13 MED ORDER — HYDRALAZINE HCL 25 MG PO TABS
50.0000 mg | ORAL_TABLET | Freq: Two times a day (BID) | ORAL | Status: DC
  Administered 2018-03-13 – 2018-03-14 (×3): 50 mg via ORAL
  Filled 2018-03-13 (×4): qty 2

## 2018-03-13 MED ORDER — FUROSEMIDE 10 MG/ML IJ SOLN
40.0000 mg | Freq: Once | INTRAMUSCULAR | Status: AC
  Administered 2018-03-13: 40 mg via INTRAVENOUS
  Filled 2018-03-13: qty 4

## 2018-03-13 MED ORDER — ONDANSETRON HCL 4 MG/2ML IJ SOLN: 4.0000 mg | Freq: Four times a day (QID) | INTRAMUSCULAR | Status: DC | PRN

## 2018-03-13 NOTE — Progress Notes (Signed)
Patient arrived on floor [phone]) with family. Pt oriented to unit. Will continue to monitor.

## 2018-03-13 NOTE — ED Notes (Signed)
Walked patient around nursing station patient did well stayed at 97 room air but stated that she felt sob

## 2018-03-13 NOTE — ED Provider Notes (Signed)
[place] EMERGENCY DEPARTMENT Provider Note   CSN: [id number] Arrival date & time: 03/13/18  0440     History   Chief Complaint Chief Complaint  Patient presents with  . Shortness of Breath  . Chest Pain    HPI MIO SCHELLINGER is a 72 y.o. female.  HPI   ASHYIA SCHRAEDER is a 72 y.o. female, with a history of AICD, CHF, DM, HTN, presenting to the ED with shortness of breath for the past 2 to 3 days.  Accompanied by increased orthopnea.  States she will have central chest pain lasting 1 to 2 seconds, feels like a shock, nonradiating, 8/10, comes on at rest or with exertion.  No chest pain currently or at all today.  Had 2 episodes of vomiting yesterday as well as some diarrhea.  Nasal congestion, postnasal drip, and intermittently productive cough for the past week.  Denies fever/chills, hematemesis, hematochezia/melena, dizziness, diaphoresis, abdominal pain, lower extremity edema/pain, or any other complaints.  Cardiologist: Dr. [name]  Past Medical History:  Diagnosis Date  . AICD (automatic cardioverter/defibrillator) present 10/08/2014   SUBQ    /    DR [name]  . CHF (congestive heart failure) ([place])   . Diabetes mellitus without complication ([place])   . History of cardiac cath 05/2007   normal-with patent coronaries  . History of colonoscopy   . History of hiatal hernia   . Hypertension   . Iatrogenic thyroiditis   . Non-ischemic cardiomyopathy ([place])    EF 28%- reassessment of LV function 2011 with LVEf 45-50%  . PAD (peripheral artery disease) (H[facility])    lower extremities with ABIs of 0.5 bilaterally  . Personal history of goiter   . S/P radioactive iodine thyroid ablation   . S/P thyroidectomy   . Shortness of breath dyspnea     Patient Active Problem List   Diagnosis Date Noted  . Acute on chronic systolic CHF (congestive heart failure) ([facility]) 03/13/2018  . CAD (coronary artery disease) 06/18/2017  . PAD (peripheral artery disease) ([place])  06/18/2017  . ICD (implantable cardioverter-defibrillator) in place 06/18/2017  . Chronic combined systolic and diastolic CHF (congestive heart failure) ([place]) 05/22/2017  . Hyperlipidemia associated with type 2 diabetes mellitus ([place]) 12/11/2016  . Treatment-emergent central sleep apnea 09/20/2016  . NICM (nonischemic cardiomyopathy) ([place]) 10/08/2014  . Cigarette smoker 10/30/2012  . 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 DeQuervain's disease (tenosynovitis) 09/01/2010  . Atherosclerosis 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 ([place]) 12/07/2008  . Diabetes ([place]) 10/29/2008  . Hypothyroidism 01/31/2007  . Essential hypertension 01/31/2007  . GERD 01/31/2007    Past Surgical History:  Procedure Laterality Date  . EP IMPLANTABLE DEVICE N/A 10/08/2014   Procedure: SubQ ICD Implant;  Surgeon: [name], MD;  Location: [facility];  Service: Cardiovascular;  Laterality: N/A;  . THYROIDECTOMY    . TONSILLECTOMY    . TOTAL ABDOMINAL HYSTERECTOMY       OB History   None      Home Medications    Prior to Admission medications   Medication Sig Start Date End Date Taking? Authorizing Provider  aspirin 81 MG tablet Take 81 mg by mouth daily.     Yes [provider]  atorvastatin (LIPITOR) 40 MG tablet Take 1.5 tablets (60 mg total) by mouth daily. 06/28/17  Yes Colin Benton R, DO  carvedilol (COREG) 25 MG tablet Take 1 tablet (25 mg total) by mouth 2 (two) times daily with  a meal. 12/18/16  Yes [name], MD  furosemide (LASIX) 40 MG tablet Take 1 tablet (40 mg total) by mouth 2 (two) times daily. 07/09/17  Yes [name], MD  glucose blood [facility] VERIO) test strip Use as instructed to check blood sugar once a day 06/09/16  Yes Colin Benton R, DO  hydrALAZINE (APRESOLINE) 50 MG tablet Take 1 tablet (50 mg total) by mouth 3 (three) times daily. Patient taking differently: Take 50 mg by mouth 2 (two) times daily.  05/25/17  Yes Mikhail,  Velta Addison, DO  hydroxychloroquine (PLAQUENIL) 200 MG tablet Take 1 tablet by mouth 2 (two) times daily. 11/01/17  Yes [provider]  isosorbide mononitrate (IMDUR) 30 MG 24 hr tablet Take 1 tablet (30 mg total) by mouth daily. 02/15/18 02/10/19 Yes [name], MD  levothyroxine (SYNTHROID, LEVOTHROID) 150 MCG tablet TAKE 1 TABLET(150 MCG) BY MOUTH DAILY Patient taking differently: Take 150 mcg by mouth every morning.  02/14/18  Yes Colin Benton R, DO  ONETOUCH DELICA LANCETS 54Y MISC 1 Device by Other route daily. 06/09/16  Yes Lucretia Kern, DO  predniSONE (DELTASONE) 10 MG tablet Take 1 tablet by mouth daily. 11/01/17  Yes [provider]  sacubitril-valsartan (ENTRESTO) 49-51 MG Take 1 tablet by mouth 2 (two) times daily. 05/25/17  Yes Mikhail, Velta Addison, DO  spironolactone (ALDACTONE) 50 MG tablet Take 1 tablet (50 mg total) by mouth daily. 02/15/18 02/10/19 Yes [name], MD    Family History Family History  Problem Relation Age of Onset  . Diabetes type II Mother   . Hypertension Mother   . Diabetes Mother   . Heart disease Mother   . Other Father        deceased from accident age 55  . Hyperlipidemia Father     Social History Social History   Tobacco Use  . Smoking status: Current Some Day Smoker    Packs/day: 0.25    Years: 40.00    Pack years: 10.00    Types: Cigarettes  . Smokeless tobacco: Never Used  . Tobacco comment: up to 1 a day  Substance Use Topics  . Alcohol use: Yes    Alcohol/week: 0.0 standard drinks    Comment: rare glass of wine  . Drug use: No     Allergies   Ibuprofen   Review of Systems Review of Systems  Constitutional: Negative for chills, diaphoresis and fever.  HENT: Positive for congestion.   Respiratory: Positive for cough and shortness of breath.   Cardiovascular: Positive for chest pain (intermittent). Negative for leg swelling.  Gastrointestinal: Positive for diarrhea, nausea and vomiting. Negative for abdominal  pain, blood in stool and constipation.  Genitourinary: Negative for dysuria and hematuria.  Musculoskeletal: Negative for back pain.  Neurological: Negative for dizziness, syncope and light-headedness.  All other systems reviewed and are negative.    Physical Exam Updated Vital Signs BP (!) 157/90 (BP Location: Right Arm)   Pulse 79   Temp 97.7 F (36.5 C) (Oral)   Resp (!) 21   Ht 5' 10.5" (1.791 m)   Wt 88.9 kg   SpO2 95%   BMI 27.73 kg/m   Physical Exam  Constitutional: She appears well-developed and well-nourished. No distress.  HENT:  Head: Normocephalic and atraumatic.  Eyes: Conjunctivae are normal.  Neck: Neck supple.  Cardiovascular: Normal rate, regular rhythm, normal heart sounds and intact distal pulses.  Pulmonary/Chest: Effort normal and breath sounds normal. No respiratory distress.  Abdominal: Soft. There  is no tenderness. There is no guarding.  Musculoskeletal: She exhibits no edema.  Lymphadenopathy:    She has no cervical adenopathy.  Neurological: She is alert.  Skin: Skin is warm and dry. She is not diaphoretic.  Psychiatric: She has a normal mood and affect. Her behavior is normal.  Nursing note and vitals reviewed.    ED Treatments / Results  Labs (all labs ordered are listed, but only abnormal results are displayed) Labs Reviewed  BASIC METABOLIC PANEL - Abnormal; Notable for the following components:      Result Value   Glucose, Bld 131 (*)    Creatinine, Ser 1.45 (*)    GFR calc non Af Amer 35 (*)    GFR calc Af Amer 41 (*)    All other components within normal limits  CBC - Abnormal; Notable for the following components:   MCHC 29.8 (*)    All other components within normal limits  BRAIN NATRIURETIC PEPTIDE - Abnormal; Notable for the following components:   B Natriuretic Peptide 1,050.7 (*)    All other components within normal limits  HEMOGLOBIN A1C  I-STAT TROPONIN, ED    EKG EKG Interpretation  Date/Time:  Wednesday  March 13 2018 07:15:04 EST Ventricular Rate:  76 PR Interval:  228 QRS Duration: 96 QT Interval:  412 QTC Calculation: 463 R Axis:   75 Text Interpretation:  Sinus rhythm with 1st degree A-V block Minimal voltage criteria for LVH, may be normal variant Nonspecific T wave abnormality Abnormal ECG T wave changes similar distribution but less prominent compared to Feb 2019 Confirmed by Sherwood Gambler [phone]) on 03/13/2018 7:36:39 AM   Radiology Dg Chest 2 View  Result Date: 03/13/2018 CLINICAL DATA:  72 y/o F; chest pain and shortness of breath for 3 days. EXAM: CHEST - 2 VIEW COMPARISON:  05/22/2017 chest radiograph FINDINGS: Stable cardiomegaly given projection and technique. Single lead AICD. Aortic atherosclerosis with calcification. Diffuse reticular opacities of the lungs. Linear opacity of right lung base compatible with atelectasis. No consolidation. No pleural effusion or pneumothorax. No acute osseous abnormality is evident. IMPRESSION: Diffuse reticular opacities of the lungs probably representing interstitial edema. Right basilar atelectasis. Stable cardiomegaly. Electronically Signed   By: Kristine Garbe M.D.   On: 03/13/2018 05:19    Procedures Procedures (including critical care time)  Medications Ordered in ED Medications  aspirin tablet 81 mg (has no administration in time range)  hydroxychloroquine (PLAQUENIL) tablet 200 mg (has no administration in time range)  atorvastatin (LIPITOR) tablet 60 mg (has no administration in time range)  carvedilol (COREG) tablet 25 mg (has no administration in time range)  hydrALAZINE (APRESOLINE) tablet 50 mg (has no administration in time range)  isosorbide mononitrate (IMDUR) 24 hr tablet 30 mg (has no administration in time range)  sacubitril-valsartan (ENTRESTO) 49-51 mg per tablet (has no administration in time range)  spironolactone (ALDACTONE) tablet 50 mg (has no administration in time range)  levothyroxine  (SYNTHROID, LEVOTHROID) tablet 150 mcg (has no administration in time range)  predniSONE (DELTASONE) tablet 10 mg (has no administration in time range)  enoxaparin (LOVENOX) injection 40 mg (has no administration in time range)  sodium chloride flush (NS) 0.9 % injection 3 mL (has no administration in time range)  sodium chloride flush (NS) 0.9 % injection 3 mL (has no administration in time range)  0.9 %  sodium chloride infusion (has no administration in time range)  acetaminophen (TYLENOL) tablet 650 mg (has no administration in time range)  ondansetron (  ZOFRAN) injection 4 mg (has no administration in time range)  insulin aspart (novoLOG) injection 0-15 Units (has no administration in time range)  furosemide (LASIX) injection 60 mg (has no administration in time range)  insulin aspart (novoLOG) injection 0-5 Units (has no administration in time range)  furosemide (LASIX) injection 40 mg (40 mg Intravenous Given 03/13/18 0803)     Initial Impression / Assessment and Plan / ED Course  I have reviewed the triage vital signs and the nursing notes.  Pertinent labs & imaging results that were available during my care of the patient were reviewed by me and considered in my medical decision making (see chart for details).  Clinical Course as of Mar 13 1057  Wed Mar 13, 2018  0934 Patient ambulated well on SPO2.  Maintained SPO2 at 97%, however, felt short of breath.  Patient visibly dyspneic and tachypneic.   [SJ]  [id number] Spoke with Dr. Lorin Mercy, hospitalist. Agrees to admit the patient.   [SJ]  1497 WYOVZ with Trish, Water engineer. Will send someone to see the patient.   [SJ]    Clinical Course User Index [SJ] ,  C, PA-C    Patient presents with shortness of breath over the last few days.  Increased orthopnea, pulmonary edema on chest x-ray, and elevated BNP give suspicion for acute on chronic CHF.  Initial troponin negative.  Patient dyspneic on exertion, even after Lasix.  Admitted  for further evaluation and diuresis.     Findings and plan of care discussed with Sherwood Gambler, MD. Dr. Regenia Skeeter personally evaluated and examined this patient.  Vitals:   03/13/18 0730 03/13/18 0745 03/13/18 0800 03/13/18 0815  BP: (!) 163/85 (!) 153/80 (!) 151/80   Pulse: 73 69 68 75  Resp: 17 15 (!) 24 (!) 31  Temp:      TempSrc:      SpO2: 98% 98% 97% 98%  Weight:      Height:         Final Clinical Impressions(s) / ED Diagnoses   Final diagnoses:  Shortness of breath  Acute on chronic congestive heart failure, unspecified heart failure type [facility])    ED Discharge Orders    None       Layla Maw 03/13/18 1100    Sherwood Gambler, MD 03/18/18 1549

## 2018-03-13 NOTE — ED Triage Notes (Addendum)
Pt reports sob for the last two days with some intermittent central chest pain that radiates down her left arm with numbness that started yesterday evening. Pt reports she has been unable to sleep lying flat. Hx of htn and CHF, compliant with medications.

## 2018-03-13 NOTE — H&P (Signed)
History and Physical    Paula Stein:[id number] DOB: [birth year] DOA: 03/13/2018  PCP: Lucretia Kern, DO Consultants:  Burt Knack - cardiology; Barkley Bruns; Sood - pulmonology; [name] - EP Patient coming from:  Home - lives alone; NOK: Son, [phone]  Chief Complaint: CP/SOB  HPI: Paula Stein is a 72 y.o. female with medical history significant of thyroiditis s/p RAI and thyroidectomy; PAD; NICM with EF 28% -> 45-50%; HTN; DM; and AICD placement presenting with CP/SOB.  Patient has had SOB and a little chest discomfort, pain.  Usually sleeps with 2 pillows but she graduated up to 5 without relief.  +cough about a week ago, productive of some mucus, clear.  +PND x 2-3 times a night.  No change in medications recently.   ED Course:  Fluid overload, acute on chronic CHF.  SOB x 2-3 days, elevated BNP, pleural effusion on CXR.  Given IV Lasix.  Very SOB and visibly dyspneic with ambulation, but maintained O2 sats.  Review of Systems: As per HPI; otherwise review of systems reviewed and negative.   Ambulatory Status:  Ambulates without assistance  Past Medical History:  Diagnosis Date  . AICD (automatic cardioverter/defibrillator) present 10/08/2014   SUBQ    /    DR [name]  . CHF (congestive heart failure) ([place])   . Diabetes mellitus without complication ([place])   . History of cardiac cath 05/2007   normal-with patent coronaries  . History of colonoscopy   . History of hiatal hernia   . Hypertension   . Iatrogenic thyroiditis   . Non-ischemic cardiomyopathy ([place])    EF 28%- reassessment of LV function 2011 with LVEf 45-50%  . PAD (peripheral artery disease) ([facility])    lower extremities with ABIs of 0.5 bilaterally  . Personal history of goiter   . S/P radioactive iodine thyroid ablation   . S/P thyroidectomy   . Shortness of breath dyspnea     Past Surgical History:  Procedure Laterality Date  . EP IMPLANTABLE DEVICE N/A 10/08/2014   Procedure: SubQ ICD Implant;  Surgeon:  [name], MD;  Location: [facility];  Service: Cardiovascular;  Laterality: N/A;  . THYROIDECTOMY    . TONSILLECTOMY    . TOTAL ABDOMINAL HYSTERECTOMY      Social History   Socioeconomic History  . Marital status: Divorced    Spouse name: Not on file  . Number of children: 1  . Years of education: Not on file  . Highest education level: Not on file  Occupational History  . Occupation: Retired    Fish farm manager: RETIRED    CommentAdvertising copywriter  Social Needs  . Financial resource strain: Not on file  . Food insecurity:    Worry: Not on file    Inability: Not on file  . Transportation needs:    Medical: Not on file    Non-medical: Not on file  Tobacco Use  . Smoking status: Current Some Day Smoker    Packs/day: 0.25    Years: 40.00    Pack years: 10.00    Types: Cigarettes  . Smokeless tobacco: Never Used  . Tobacco comment: up to 1 a day  Substance and Sexual Activity  . Alcohol use: Yes    Alcohol/week: 0.0 standard drinks    Comment: rare glass of wine  . Drug use: No  . Sexual activity: Not Currently  Lifestyle  . Physical activity:    Days per week: Not on file  Minutes per session: Not on file  . Stress: Not on file  Relationships  . Social connections:    Talks on phone: Not on file    Gets together: Not on file    Attends religious service: Not on file    Active member of club or organization: Not on file    Attends meetings of clubs or organizations: Not on file    Relationship status: Not on file  . Intimate partner violence:    Fear of current or ex partner: Not on file    Emotionally abused: Not on file    Physically abused: Not on file    Forced sexual activity: Not on file  Other Topics Concern  . Not on file  Social History Narrative   Retired Education officer, museum   Divorced - one grown son   current smoker    Alcohol use-no      Drug use-no    Regular exercise- no     son - Joby Hershkowitz    Allergies  Allergen Reactions  .  Ibuprofen Nausea Only    Family History  Problem Relation Age of Onset  . Diabetes type II Mother   . Hypertension Mother   . Diabetes Mother   . Heart disease Mother   . Other Father        deceased from accident age 27  . Hyperlipidemia Father     Prior to Admission medications   Medication Sig Start Date End Date Taking? Authorizing Provider  aspirin 81 MG tablet Take 81 mg by mouth daily.     Yes [provider]  atorvastatin (LIPITOR) 40 MG tablet Take 1.5 tablets (60 mg total) by mouth daily. 06/28/17  Yes Lucretia Kern, DO  carvedilol (COREG) 25 MG tablet Take 1 tablet (25 mg total) by mouth 2 (two) times daily with a meal. 12/18/16  Yes [name], MD  furosemide (LASIX) 40 MG tablet Take 1 tablet (40 mg total) by mouth 2 (two) times daily. 07/09/17  Yes [name], MD  glucose blood [facility] VERIO) test strip Use as instructed to check blood sugar once a day 06/09/16  Yes Colin Benton R, DO  hydrALAZINE (APRESOLINE) 50 MG tablet Take 1 tablet (50 mg total) by mouth 3 (three) times daily. Patient taking differently: Take 50 mg by mouth 2 (two) times daily.  05/25/17  Yes Mikhail, Velta Addison, DO  hydroxychloroquine (PLAQUENIL) 200 MG tablet Take 1 tablet by mouth 2 (two) times daily. 11/01/17  Yes [provider]  isosorbide mononitrate (IMDUR) 30 MG 24 hr tablet Take 1 tablet (30 mg total) by mouth daily. 02/15/18 02/10/19 Yes [name], MD  levothyroxine (SYNTHROID, LEVOTHROID) 150 MCG tablet TAKE 1 TABLET(150 MCG) BY MOUTH DAILY Patient taking differently: Take 150 mcg by mouth every morning.  02/14/18  Yes Colin Benton R, DO  ONETOUCH DELICA LANCETS 10X MISC 1 Device by Other route daily. 06/09/16  Yes Lucretia Kern, DO  predniSONE (DELTASONE) 10 MG tablet Take 1 tablet by mouth daily. 11/01/17  Yes [provider]  sacubitril-valsartan (ENTRESTO) 49-51 MG Take 1 tablet by mouth 2 (two) times daily. 05/25/17  Yes Mikhail, Velta Addison, DO  spironolactone  (ALDACTONE) 50 MG tablet Take 1 tablet (50 mg total) by mouth daily. 02/15/18 02/10/19 Yes [name], MD    Physical Exam: Vitals:   03/13/18 1158 03/13/18 1216 03/13/18 1415 03/13/18 1628  BP: (!) 150/74 (!) 152/81  119/69  Pulse: 79 76  68  Resp: (!) 21   16  Temp:    98.8 F (37.1 C)  TempSrc:    Oral  SpO2: 99%  96% 98%  Weight:      Height:         General:  Appears calm and comfortable and is NAD Eyes:  PERRL, EOMI, normal lids, iris ENT:  grossly normal hearing, lips & tongue, mmm Neck:  no LAD, masses or thyromegaly Cardiovascular:  RRR, no m/r/g. No LE edema.  Respiratory:   Mild bibasilar crackles, normal respiratory effort. Abdomen:  soft, NT, ND, NABS Back:   normal alignment, no CVAT Skin:  no rash or induration seen on limited exam Musculoskeletal:  grossly normal tone BUE/BLE, good ROM, no bony abnormality Psychiatric:  grossly normal mood and affect, speech fluent and appropriate, AOx3 Neurologic:  CN 2-12 grossly intact, moves all extremities in coordinated fashion, sensation intact    Radiological Exams on Admission: Dg Chest 2 View  Result Date: 03/13/2018 CLINICAL DATA:  72 y/o F; chest pain and shortness of breath for 3 days. EXAM: CHEST - 2 VIEW COMPARISON:  05/22/2017 chest radiograph FINDINGS: Stable cardiomegaly given projection and technique. Single lead AICD. Aortic atherosclerosis with calcification. Diffuse reticular opacities of the lungs. Linear opacity of right lung base compatible with atelectasis. No consolidation. No pleural effusion or pneumothorax. No acute osseous abnormality is evident. IMPRESSION: Diffuse reticular opacities of the lungs probably representing interstitial edema. Right basilar atelectasis. Stable cardiomegaly. Electronically Signed   By: Kristine Garbe M.D.   On: 03/13/2018 05:19    EKG: Independently reviewed.  NSR with rate 76; nonspecific ST changes with no evidence of acute ischemia   Labs on  Admission: I have personally reviewed the available labs and imaging studies at the time of the admission.  Pertinent labs:   Glucose 131 BUN 20/Creatinine 1.45/GFR 35; 15/0.99/77 in 6/19 BNP 1050.7; 515.6 in 2/19 Troponin 0.03 Unremarkable CBC TSH 4.51 on 11/20   Assessment/Plan Principal Problem:   Acute on chronic combined systolic and diastolic CHF (congestive heart failure) (H[facility]) Active Problems:   Hypothyroidism   Complex sleep apnea syndrome   Cigarette smoker   Hyperlipidemia associated with type 2 diabetes mellitus ([place])   Non-ischemic cardiomyopathy ([place])   Hypertension   Diabetes mellitus without complication ([place])   AICD (automatic cardioverter/defibrillator) present    Acute on chronic CHF exacerbation from NICM -Patient presenting with worsening SOB and orthopnea -She did not have significant hypoxia with ambulation but was extremely symptomatic -CXR consistent with pulmonary edema -Elevated BNP -With elevated BNP and abnl CXR, CHF seems most probable as diagnosis -Will place in observation status with telemetry -Will request repeat echocardiogram -Will continue ASA -Will continue Entresto -Will continue Coreg -CHF order set utilized; may need CHF team consult but will hold until Echo results are available -Was given Lasix 40 mg x 1 in ER and will repeat with 60 mg IV BID -Continue [place] O2 prn for now -Slightly worse renal function, likely associated with hypoperfusion; will follow -Repeat EKG in AM -Has AICD  Hypothyroidism -Check TSH -Continue Synthroid at current dose for now  HTN -Continue Coreg, Entresto, hydralazine  DM -Will check A1c -She is diet controlled -Cover with moderate-scale SSI  HLD -Continue Lipitor  OSA -Continue CPAP  Tobacco dependence -Tobacco Dependence: encourage cessation.   -This was discussed with the patient and should be reviewed on an ongoing basis.   -Patch declined  DVT prophylaxis: Lovenox  Code Status:  Full - confirmed with  patient/family Family Communication: Son present throughout evaluation Disposition Plan:  Home once clinically improved Consults called:  Cardiology; CM; PT Admission status: It is my clinical opinion that referral for OBSERVATION is reasonable and necessary in this patient based on the above information provided. The aforementioned taken together are felt to place the patient at high risk for further clinical deterioration. However it is anticipated that the patient may be medically stable for discharge from the hospital within 24 to 48 hours.    Karmen Bongo MD [facility]  If note is complete, please contact covering daytime or nighttime physician. [email or web address] Password [facility]  03/13/2018, 6:22 PM

## 2018-03-13 NOTE — Evaluation (Signed)
Physical Therapy Evaluation Patient Details Name: Paula Stein MRN: [id number] DOB: [birth year] Today's Date: 03/13/2018   History of Present Illness  Paula Stein is a 72yo female who comes to [facility] on 12/11 after worsening SOB. PMH: HTN, CHF. PTA pt performed limited community AMB, which was limited by her chronic SOB; no falls history but 1 'close call' in past 3 months.   Clinical Impression  Pt admitted with above diagnosis. Pt currently with functional limitations due to the deficits listed below (see "PT Problem List"). Upon entry, pt in bed, no family/caregiver present. The pt is awake and agreeable to participate.  The pt is alert and oriented x3, pleasant, conversational, and following commands consistently. Pt tolerating AMB around unit 400+ft with some fatigue as per baseline, but reports that SOB is improved. Functional mobility assessment demonstrates minimally increased effort/time requirements, somewhat tolerance, but no frank need for physical assistance, whereas the patient performed these slightly higher level of independence PTA. Patient is very near baseline, all education completed, and time is given to address all questions/concerns. No additional skilled PT services needed at this time, PT signing off. PT recommends daily ambulation ad lib or with nursing staff as needed to prevent deconditioning.      Follow Up Recommendations Outpatient PT(OPPT (pt reports concerns regarding baseline activity tolerance impairment and baseline unsteadiness on feet) )    Equipment Recommendations  None recommended by PT;Other (comment)    Recommendations for Other Services       Precautions / Restrictions Precautions Precautions: Fall Restrictions Weight Bearing Restrictions: No      Mobility  Bed Mobility Overal bed mobility: Modified Independent                Transfers Overall transfer level: Modified independent                   Ambulation/Gait Ambulation/Gait assistance: Supervision Gait Distance (Feet): 400 Feet Assistive device: None Gait Pattern/deviations: Staggering left;Staggering right;WFL(Within Functional Limits) Gait velocity: 0.32ms    General Gait Details: mostly stable, with interment instability of gait. Pt appears confidence, doesn't attempt to stabilize externally.   Stairs            Wheelchair Mobility    Modified Rankin (Stroke Patients Only)       Balance Overall balance assessment: Modified Independent                                           Pertinent Vitals/Pain Pain Assessment: No/denies pain    Home Living Family/patient expects to be discharged to:: Private residence Living Arrangements: Alone Available Help at Discharge: Family;Friend(s);Available PRN/intermittently Type of Home: House Home Access: Stairs to enter Entrance Stairs-Rails: Right Entrance Stairs-Number of Steps: 3 Home Layout: One level Home Equipment: Cane - single point;Shower seat;Walker - 2 wheels      Prior Function Level of Independence: Independent         Comments: Leaves SPC/RW in car for when she is not able to park close and has to walk further distance, but relies on shoppign cart for AMB grocery store for energy conservation.      Hand Dominance        Extremity/Trunk Assessment   Upper Extremity Assessment Upper Extremity Assessment: Overall WFL for tasks assessed    Lower Extremity Assessment Lower Extremity Assessment: Overall WFL for tasks assessed  Communication   Communication: No difficulties  Cognition Arousal/Alertness: Awake/alert Behavior During Therapy: WFL for tasks assessed/performed Overall Cognitive Status: Within Functional Limits for tasks assessed                                        General Comments      Exercises     Assessment/Plan    PT Assessment All further PT needs can be met in the  next venue of care;Patent does not need any further PT services  PT Problem List Decreased activity tolerance;Decreased mobility       PT Treatment Interventions      PT Goals (Current goals can be found in the Care Plan section)  Acute Rehab PT Goals PT Goal Formulation: All assessment and education complete, DC therapy    Frequency     Barriers to discharge        Co-evaluation               AM-PAC PT "6 Clicks" Mobility  Outcome Measure Help needed turning from your back to your side while in a flat bed without using bedrails?: None Help needed moving from lying on your back to sitting on the side of a flat bed without using bedrails?: None Help needed moving to and from a bed to a chair (including a wheelchair)?: None Help needed standing up from a chair using your arms (e.g., wheelchair or bedside chair)?: None Help needed to walk in hospital room?: None Help needed climbing 3-5 steps with a railing? : A Little 6 Click Score: 23    End of Session   Activity Tolerance: Patient tolerated treatment well;Patient limited by fatigue Patient left: in bed;with call bell/phone within reach Nurse Communication: Mobility status PT Visit Diagnosis: Unsteadiness on feet (R26.81);Difficulty in walking, not elsewhere classified (R26.2)    Time: 1245-8099 PT Time Calculation (min) (ACUTE ONLY): 22 min   Charges:   PT Evaluation $PT Eval Low Complexity: 1 Low PT Treatments $Therapeutic Activity: 8-22 mins        2:46 PM, 03/13/18 Etta Grandchild, PT, DPT Physical Therapist - [place] [phone] (Pager)  [phone] (Office)      , C 03/13/2018, 2:41 PM

## 2018-03-13 NOTE — ED Notes (Signed)
Patient ambulatory to bathroom with steady gait at this time. No signs of distress noted.

## 2018-03-13 NOTE — Progress Notes (Signed)
  Echocardiogram 2D Echocardiogram has been performed.  Paula Stein 03/13/2018, 4:33 PM

## 2018-03-14 DIAGNOSIS — R2681 Unsteadiness on feet: Secondary | ICD-10-CM | POA: Diagnosis not present

## 2018-03-14 DIAGNOSIS — I5043 Acute on chronic combined systolic (congestive) and diastolic (congestive) heart failure: Secondary | ICD-10-CM | POA: Diagnosis not present

## 2018-03-14 DIAGNOSIS — F17210 Nicotine dependence, cigarettes, uncomplicated: Secondary | ICD-10-CM | POA: Diagnosis not present

## 2018-03-14 DIAGNOSIS — I110 Hypertensive heart disease with heart failure: Secondary | ICD-10-CM | POA: Diagnosis not present

## 2018-03-14 LAB — CBC WITH DIFFERENTIAL/PLATELET
Abs Immature Granulocytes: 0.03 10*3/uL (ref 0.00–0.07)
Basophils Absolute: 0 10*3/uL (ref 0.0–0.1)
Basophils Relative: 0 %
Eosinophils Absolute: 0 10*3/uL (ref 0.0–0.5)
Eosinophils Relative: 0 %
HCT: 39.9 % (ref 36.0–46.0)
Hemoglobin: 12.3 g/dL (ref 12.0–15.0)
Immature Granulocytes: 0 %
Lymphocytes Relative: 24 %
Lymphs Abs: 1.9 10*3/uL (ref 0.7–4.0)
MCH: 28.7 pg (ref 26.0–34.0)
MCHC: 30.8 g/dL (ref 30.0–36.0)
MCV: 93 fL (ref 80.0–100.0)
Monocytes Absolute: 0.5 10*3/uL (ref 0.1–1.0)
Monocytes Relative: 6 %
NEUTROS PCT: 70 %
Neutro Abs: 5.4 10*3/uL (ref 1.7–7.7)
Platelets: 247 10*3/uL (ref 150–400)
RBC: 4.29 MIL/uL (ref 3.87–5.11)
RDW: 14.7 % (ref 11.5–15.5)
WBC: 7.8 10*3/uL (ref 4.0–10.5)
nRBC: 0 % (ref 0.0–0.2)

## 2018-03-14 LAB — BASIC METABOLIC PANEL
Anion gap: 13 (ref 5–15)
BUN: 26 mg/dL — ABNORMAL HIGH (ref 8–23)
CHLORIDE: 105 mmol/L (ref 98–111)
CO2: 22 mmol/L (ref 22–32)
Calcium: 8.9 mg/dL (ref 8.9–10.3)
Creatinine, Ser: 1.57 mg/dL — ABNORMAL HIGH (ref 0.44–1.00)
GFR calc Af Amer: 37 mL/min — ABNORMAL LOW (ref >60)
GFR calc non Af Amer: 32 mL/min — ABNORMAL LOW (ref >60)
Glucose, Bld: 160 mg/dL — ABNORMAL HIGH (ref 70–99)
POTASSIUM: 3.9 mmol/L (ref 3.5–5.1)
SODIUM: 140 mmol/L (ref 135–145)

## 2018-03-14 LAB — GLUCOSE, CAPILLARY
Glucose-Capillary: 130 mg/dL — ABNORMAL HIGH (ref 70–99)
Glucose-Capillary: 112 mg/dL — ABNORMAL HIGH (ref 70–99)

## 2018-03-14 LAB — BRAIN NATRIURETIC PEPTIDE: B Natriuretic Peptide: 587.2 pg/mL — ABNORMAL HIGH (ref 0.0–100.0)

## 2018-03-14 MED ORDER — FENTANYL CITRATE (PF) 100 MCG/2ML IJ SOLN
INTRAMUSCULAR | Status: AC
  Filled 2018-03-14: qty 2

## 2018-03-14 MED ORDER — MIDAZOLAM HCL 2 MG/2ML IJ SOLN
INTRAMUSCULAR | Status: AC
  Filled 2018-03-14: qty 2

## 2018-03-14 NOTE — Care Management Obs Status (Signed)
Walker NOTIFICATION   Patient Details  Name: Paula Stein MRN: [id number] Date of Birth: Aug 14, 1945   Medicare Observation Status Notification Given:  Yes    Maryclare Labrador, RN 03/14/2018, 4:09 PM

## 2018-03-14 NOTE — Discharge Summary (Signed)
Physician Discharge Summary  Paula Stein JJO:[id number] DOB: [birth year] DOA: 03/13/2018  PCP: Lucretia Kern, DO  Admit date: 03/13/2018 Discharge date: 03/14/2018  Admitted From: home Disposition:  home   Recommendations for Outpatient Follow-up:  1. Advised to cut back on the amount of fluids she is drinking and continue to weight herself each day  Home Health:  Not needed      Discharge Condition:  stable   CODE STATUS:  Full code   Diet recommendation:  Heart healthy, diabetic Consultations:  none    Discharge Diagnoses:  Principal Problem:   Acute on chronic combined systolic and diastolic CHF (congestive heart failure) ([place]) Active Problems:   Hypothyroidism   Complex sleep apnea syndrome   Cigarette smoker   Hyperlipidemia associated with type 2 diabetes mellitus ([place])   Non-ischemic cardiomyopathy ([place])   Hypertension   Diabetes mellitus without complication ([place])   AICD (automatic cardioverter/defibrillator) present      Brief Summary: Paula Stein is a 72 y.o. female with medical history significant of thyroiditis s/p RAI and thyroidectomy; PAD; NICM with EF 28% -> 45-50%; HTN; DM; and AICD placement presenting with CP/SOB.  Patient has had SOB and a little chest discomfort, pain.  Usually sleeps with 2 pillows but she graduated up to 5 without relief.  +cough about a week ago, productive of some mucus, clear.  +PND x 2-3 times a night.  No change in medications recently  - Hospital Course:    Principal Problem:   Acute on chronic combined systolic and diastolic CHF (congestive heart failure)   Non-ischemic cardiomyopathy  - no cough or chest congestion, no fevers - BNP improved with diuresis from 1050 to 587 - she has diuresed with IV Lasix and states she is no longer short of breath- she has ambulated in the hall and pulse ox is 98%- no dyspnea - I have further spoken with her to try to find a reason for this decompensation and she states that she keeps  a bottles of water with her and drinks all day long because her mouth is dry. I have recommended that she cut back on the water and try small amount of ice for dry mouth.  - advised to continue her home medications which I have reviewed - no changes have been made in doses  Active Problems:   Hypothyroidism - cont Synthroid     OSA - cont CPAP    Cigarette smoker - advised to stop smoking    Hyperlipidemia associated with type 2 diabetes mellitus  - cont statin     AICD (automatic cardioverter/defibrillator) present  On Plaquenil and Prednisone - states she is on these for her foot- she states she has been on them for 8 months now   Discharge Exam: Vitals:   03/14/18 1300 03/14/18 1400  BP:    Pulse: 64 66  Resp: (!) 22   Temp:    SpO2: 91% 91%   Vitals:   03/14/18 1100 03/14/18 1200 03/14/18 1300 03/14/18 1400  BP:  (!) 104/50    Pulse:  65 64 66  Resp: (!) 21 (!) 25 (!) 22   Temp:      TempSrc:      SpO2:  98% 91% 91%  Weight:      Height:        General: Pt is alert, awake, not in acute distress Cardiovascular: RRR, S1/S2 +, no rubs, no gallops Respiratory: CTA bilaterally, no wheezing, no rhonchi Abdominal: Soft,  NT, ND, bowel sounds + Extremities: no edema, no cyanosis   Discharge Instructions  Discharge Instructions    Diet - low sodium heart healthy   Complete by:  As directed    Increase activity slowly   Complete by:  As directed      Allergies as of 03/14/2018      Reactions   Ibuprofen Nausea Only      Medication List    TAKE these medications   aspirin 81 MG tablet Take 81 mg by mouth daily.   atorvastatin 40 MG tablet Commonly known as:  LIPITOR Take 1.5 tablets (60 mg total) by mouth daily.   carvedilol 25 MG tablet Commonly known as:  COREG Take 1 tablet (25 mg total) by mouth 2 (two) times daily with a meal.   furosemide 40 MG tablet Commonly known as:  LASIX Take 1 tablet (40 mg total) by mouth 2 (two) times daily.    glucose blood test strip Commonly known as:  ONETOUCH VERIO Use as instructed to check blood sugar once a day   hydrALAZINE 50 MG tablet Commonly known as:  APRESOLINE Take 1 tablet (50 mg total) by mouth 3 (three) times daily. What changed:  when to take this   hydroxychloroquine 200 MG tablet Commonly known as:  PLAQUENIL Take 1 tablet by mouth 2 (two) times daily.   isosorbide mononitrate 30 MG 24 hr tablet Commonly known as:  IMDUR Take 1 tablet (30 mg total) by mouth daily.   levothyroxine 150 MCG tablet Commonly known as:  SYNTHROID, LEVOTHROID TAKE 1 TABLET(150 MCG) BY MOUTH DAILY What changed:  See the new instructions.   ONETOUCH DELICA LANCETS 32R Misc 1 Device by Other route daily.   predniSONE 10 MG tablet Commonly known as:  DELTASONE Take 1 tablet by mouth daily.   sacubitril-valsartan 49-51 MG Commonly known as:  ENTRESTO Take 1 tablet by mouth 2 (two) times daily.   spironolactone 50 MG tablet Commonly known as:  ALDACTONE Take 1 tablet (50 mg total) by mouth daily.       Allergies  Allergen Reactions  . Ibuprofen Nausea Only     Procedures/Studies:    Dg Chest 2 View  Result Date: 03/13/2018 CLINICAL DATA:  72 y/o F; chest pain and shortness of breath for 3 days. EXAM: CHEST - 2 VIEW COMPARISON:  05/22/2017 chest radiograph FINDINGS: Stable cardiomegaly given projection and technique. Single lead AICD. Aortic atherosclerosis with calcification. Diffuse reticular opacities of the lungs. Linear opacity of right lung base compatible with atelectasis. No consolidation. No pleural effusion or pneumothorax. No acute osseous abnormality is evident. IMPRESSION: Diffuse reticular opacities of the lungs probably representing interstitial edema. Right basilar atelectasis. Stable cardiomegaly. Electronically Signed   By: Kristine Garbe M.D.   On: 03/13/2018 05:19     The results of significant diagnostics from this hospitalization (including  imaging, microbiology, ancillary and laboratory) are listed below for reference.     Microbiology: No results found for this or any previous visit (from the past 240 hour(s)).   Labs: BNP (last 3 results) Recent Labs    05/22/17 0330 03/13/18 0456 03/14/18 0050  BNP 515.6* 1,050.7* 518.8*   Basic Metabolic Panel: Recent Labs  Lab 03/13/18 0456 03/14/18 0207  NA 142 140  K 4.6 3.9  CL 109 105  CO2 24 22  GLUCOSE 131* 160*  BUN 20 26*  CREATININE 1.45* 1.57*  CALCIUM 9.2 8.9   Liver Function Tests: No results for input(s): AST,  ALT, ALKPHOS, BILITOT, PROT, ALBUMIN in the last 168 hours. No results for input(s): LIPASE, AMYLASE in the last 168 hours. No results for input(s): AMMONIA in the last 168 hours. CBC: Recent Labs  Lab 03/13/18 0456 03/14/18 0050  WBC 6.8 7.8  NEUTROABS  --  5.4  HGB 12.0 12.3  HCT 40.3 39.9  MCV 97.3 93.0  PLT 257 247   Cardiac Enzymes: No results for input(s): CKTOTAL, CKMB, CKMBINDEX, TROPONINI in the last 168 hours. BNP: Invalid input(s): POCBNP CBG: Recent Labs  Lab 03/13/18 1255 03/13/18 1640 03/13/18 2101 03/14/18 0839 03/14/18 1314  GLUCAP 128* 96 168* 130* 112*   D-Dimer No results for input(s): DDIMER in the last 72 hours. Hgb A1c Recent Labs    03/13/18 1205  HGBA1C 6.2*   Lipid Profile No results for input(s): CHOL, HDL, LDLCALC, TRIG, CHOLHDL, LDLDIRECT in the last 72 hours. Thyroid function studies Recent Labs    03/13/18 1835  TSH 4.651*   Anemia work up No results for input(s): VITAMINB12, FOLATE, FERRITIN, TIBC, IRON, RETICCTPCT in the last 72 hours. Urinalysis    Component Value Date/Time   COLORURINE YELLOW 01/28/2007 1647   APPEARANCEUR CLOUDY (A) 01/28/2007 1647   LABSPEC 1.025 01/28/2007 1647   PHURINE 5.5 01/28/2007 1647   GLUCOSEU NEGATIVE 01/28/2007 1647   HGBUR MODERATE (A) 01/28/2007 1647   BILIRUBINUR NEGATIVE 01/28/2007 1647   [place] 01/28/2007 1647   PROTEINUR >300  (A) 01/28/2007 1647   UROBILINOGEN 0.2 01/28/2007 1647   NITRITE POSITIVE (A) 01/28/2007 1647   LEUKOCYTESUR NEGATIVE 01/28/2007 1647   Sepsis Labs Invalid input(s): PROCALCITONIN,  WBC,  LACTICIDVEN Microbiology No results found for this or any previous visit (from the past 240 hour(s)).   Time coordinating discharge in minutes: 55  SIGNED:   Debbe Odea, MD  [facility] 03/14/2018, 3:43 PM Pager   If 7PM-7AM, please contact night-coverage [email or web address] Password [email or web address]

## 2018-03-14 NOTE — Progress Notes (Signed)
Nutrition Brief Note  Patient identified on the Malnutrition Screening Tool (MST) Report. Patient reports great appetite. She is consuming 100% of meals. She received 2 breakfast trays this morning and she ate everything on both trays.  Nutrition focused physical exam completed.  No muscle or subcutaneous fat depletion noticed.  Weight loss has been minimal. No significant weight changes noted.   Wt Readings from Last 15 Encounters:  03/14/18 86.9 kg  02/15/18 90.4 kg  02/14/18 89.1 kg  01/03/18 90.5 kg  12/18/17 92.5 kg  11/08/17 90.4 kg  09/04/17 91.4 kg  09/04/17 91.3 kg  08/20/17 91.3 kg  08/06/17 91.6 kg  07/17/17 92.4 kg  07/03/17 95.7 kg  06/21/17 96.5 kg  06/07/17 95.2 kg  06/06/17 95.3 kg    Body mass index is 27.1 kg/m. Patient meets criteria for overweight based on current BMI.   Current diet order is heart healthy CHO modified, patient is consuming approximately 100% of meals at this time. Labs and medications reviewed.   No nutrition interventions warranted at this time. If nutrition issues arise, please consult RD.   Molli Barrows, RD, LDN, [place] Pager [phone] After Hours Pager [phone]

## 2018-03-14 NOTE — Progress Notes (Signed)
Placed pt on CPAP M/L nasal mask with auto settings max 18 and min 5 with 2L bled in. H2O added to water chamber. Pt tolerating well. 98% RR-16 HR 67 Rt to cont to monitor

## 2018-03-14 NOTE — Care Management Note (Signed)
Case Management Note  Patient Details  Name: Paula Stein MRN: [id number] Date of Birth: 09/16/1945  Subjective/Objective:    Pt admitted with SOB and CP                  Action/Plan:  Pt independent from home alone.  Pt has PCP and denied barriers with paying for meds.  Pt declined outpt PT as recommended.  Pt informed CM that she already participates in daily weights and low sodium diet   Expected Discharge Date:  03/14/18               Expected Discharge Plan:  Home/Self Care  In-House Referral:     Discharge planning Services  CM Consult  Post Acute Care Choice:    Choice offered to:     DME Arranged:    DME Agency:     HH Arranged:    HH Agency:     Status of Service:  In process, will continue to follow  If discussed at Long Length of Stay Meetings, dates discussed:    Additional Comments:  Maryclare Labrador, RN 03/14/2018, 11:06 AM

## 2018-03-14 NOTE — Progress Notes (Signed)
Patient ambulated throughout unit and oxygen saturations remained above 98% throughout exertion.   Delorise Jackson, RN

## 2018-04-20 IMAGING — DX DG CHEST 2V
2 series · 2 of 2 positions shown · non-contrast
Comparison: 11/10/2016, 10/09/2014

CLINICAL DATA: Chest pain

EXAM:
CHEST  2 VIEW

[chest pa]
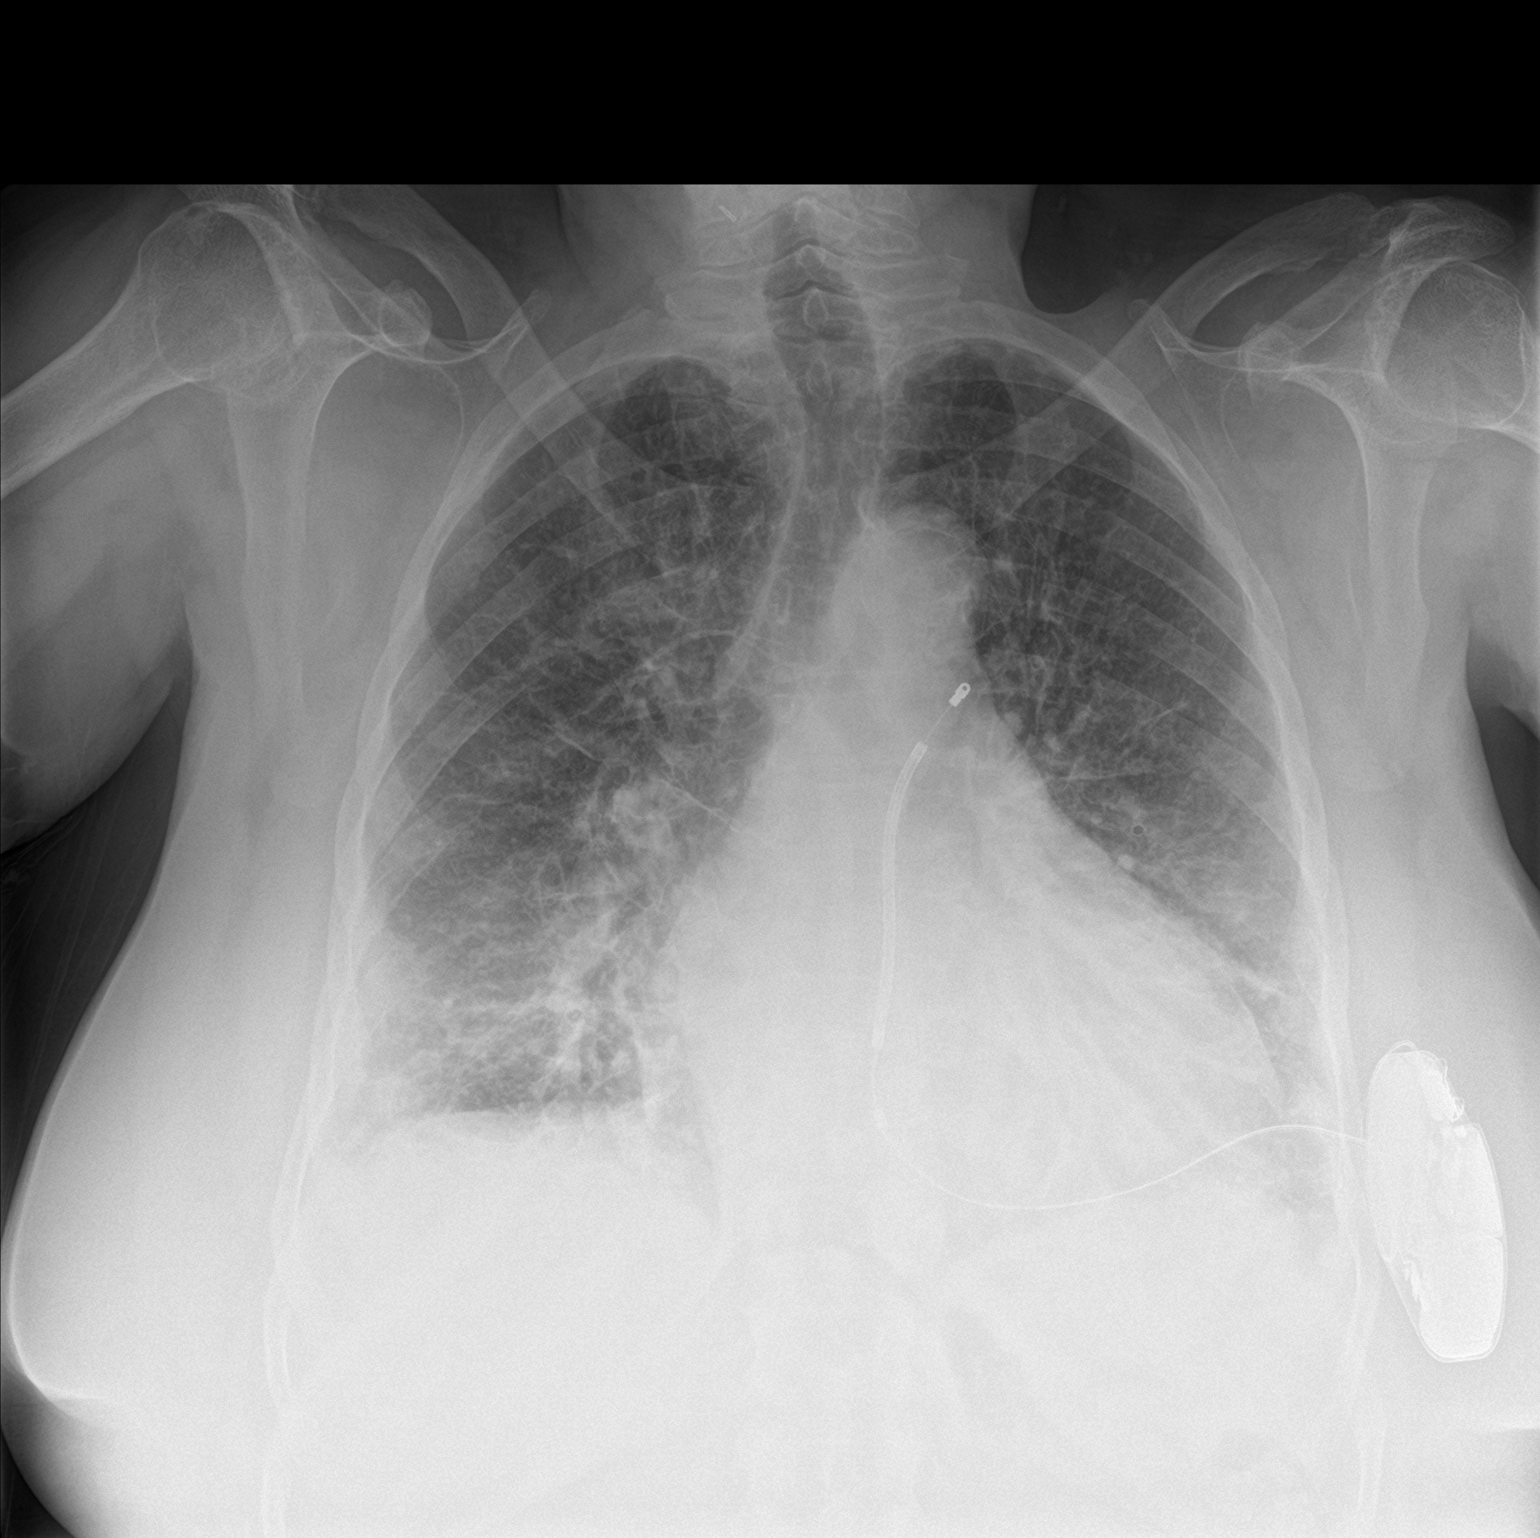

[chest lat]
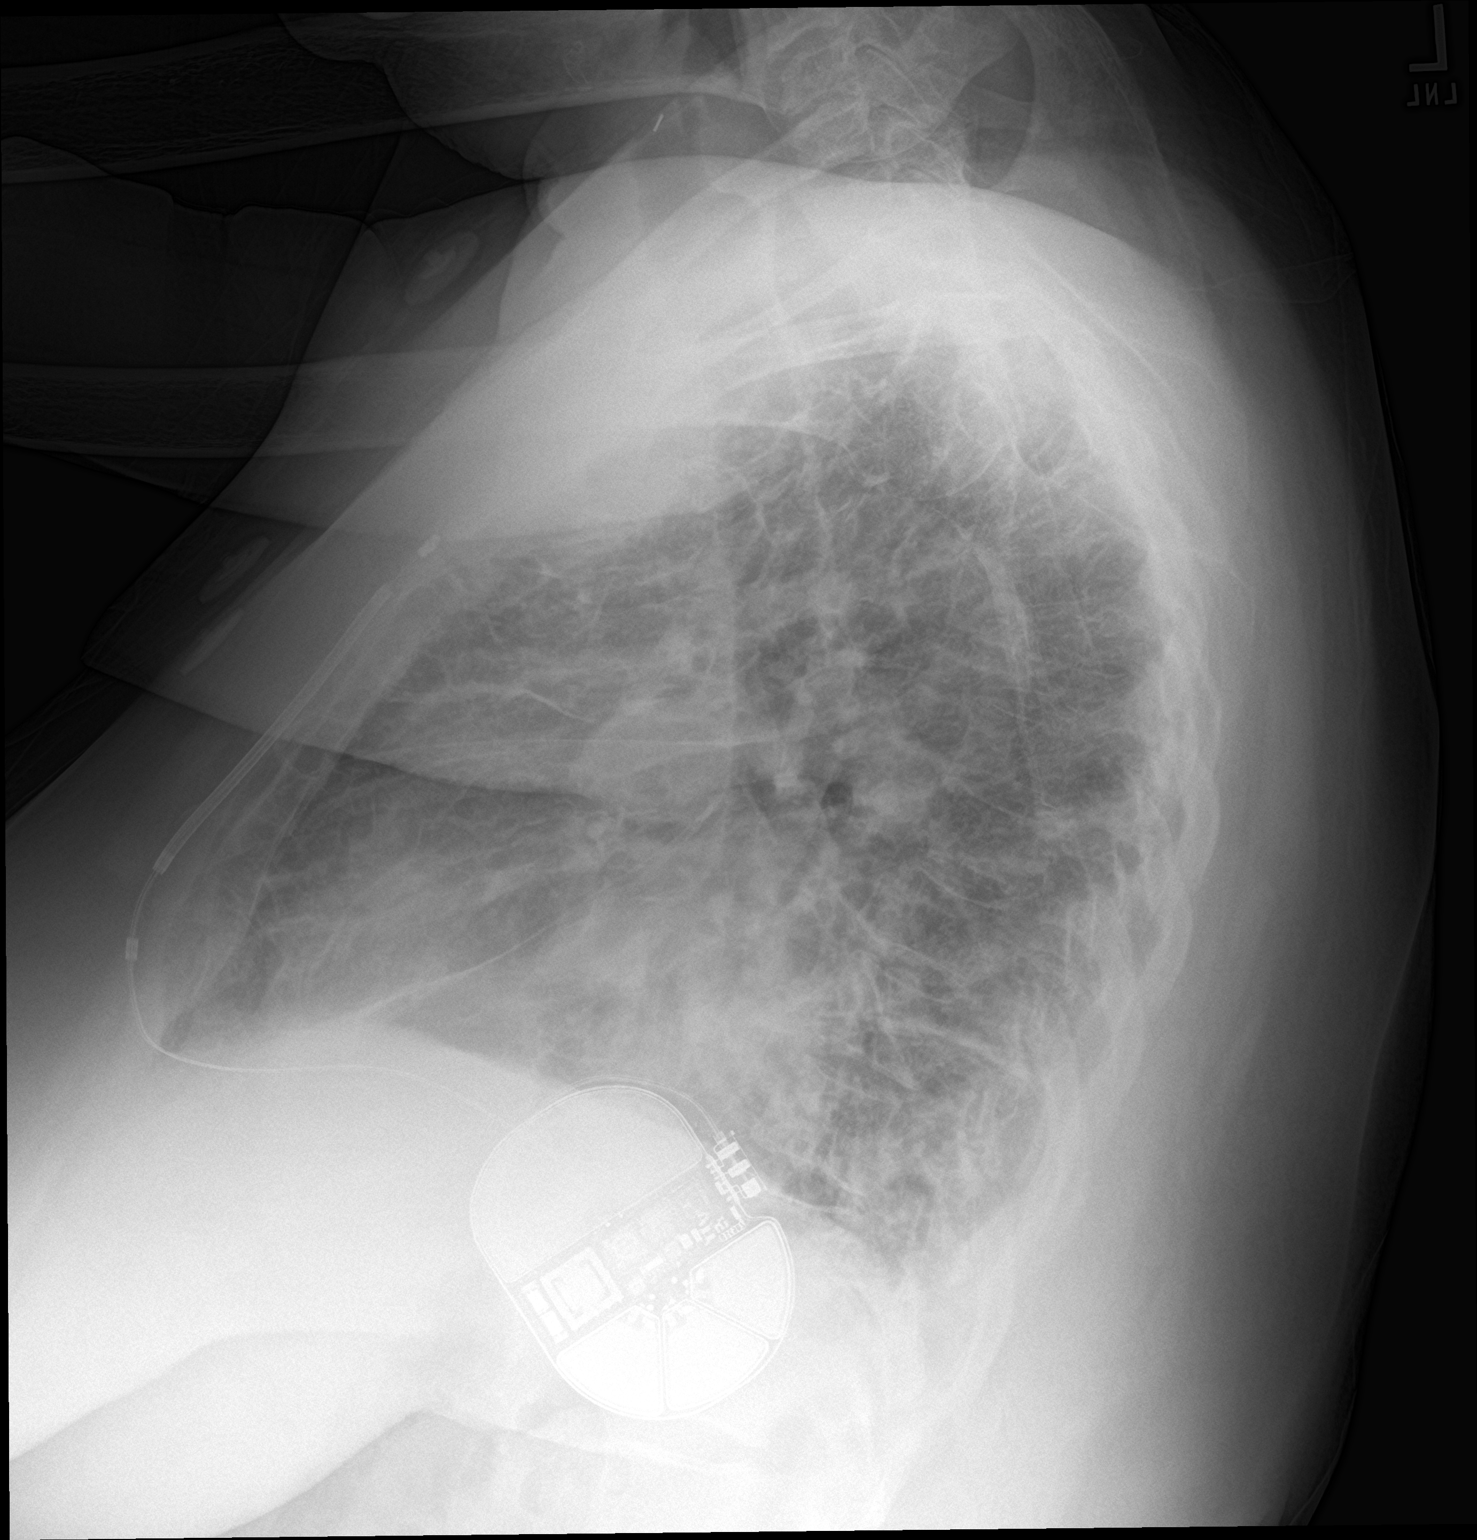

[2 of 2 positions shown; findings below may reference images not displayed]

FINDINGS: Left-sided pacing device with similar appearance of subcutaneous
pacing lead since November 2016 over the upper sternal region, the tip
appears slightly more cephalad in position. Cardiomegaly with
vascular congestion and diffuse interstitial opacity consistent with
pulmonary edema. Small pleural effusions. No pneumothorax.
IMPRESSION: 1. Cardiomegaly with vascular congestion and diffuse interstitial
opacity consistent with pulmonary edema. Small pleural effusion
2. Slight cephalad migration of the tip of the subcutaneous pacing
lead since the prior study.

## 2018-04-24 LAB — CUP PACEART REMOTE DEVICE CHECK
Battery Remaining Percentage: 62 %
Date Time Interrogation Session: 20191204122900
Implantable Lead Implant Date: 20160707
Implantable Lead Location: 753858
Implantable Lead Model: 3401
Implantable Pulse Generator Implant Date: 20160707
Pulse Gen Serial Number: 115852

## 2018-04-25 ENCOUNTER — Encounter: Payer: Medicare Other | Admitting: *Deleted

## 2018-04-25 VITALS — BP 152/82 | HR 86 | Resp 18 | Wt 201.8 lb

## 2018-04-25 DIAGNOSIS — Z006 Encounter for examination for normal comparison and control in clinical research program: Secondary | ICD-10-CM

## 2018-04-25 NOTE — Research (Signed)
Subject to research clinic for visit week 46 in the [facility].  Potential endpoint reported to sponsor.  No cos today. Only 96%compliant with meds.  New med dispensed and next follow up appointment scheduled.

## 2018-05-06 ENCOUNTER — Ambulatory Visit: Payer: Medicare Other | Admitting: Family Medicine

## 2018-06-04 ENCOUNTER — Ambulatory Visit (HOSPITAL_COMMUNITY): Payer: Medicare Other | Attending: Cardiovascular Disease

## 2018-06-04 DIAGNOSIS — I10 Essential (primary) hypertension: Secondary | ICD-10-CM | POA: Insufficient documentation

## 2018-06-04 DIAGNOSIS — I428 Other cardiomyopathies: Secondary | ICD-10-CM | POA: Diagnosis not present

## 2018-06-04 DIAGNOSIS — Z95810 Presence of automatic (implantable) cardiac defibrillator: Secondary | ICD-10-CM | POA: Insufficient documentation

## 2018-06-05 ENCOUNTER — Ambulatory Visit (INDEPENDENT_AMBULATORY_CARE_PROVIDER_SITE_OTHER): Payer: Medicare Other | Admitting: *Deleted

## 2018-06-05 DIAGNOSIS — I5022 Chronic systolic (congestive) heart failure: Secondary | ICD-10-CM

## 2018-06-05 DIAGNOSIS — I428 Other cardiomyopathies: Secondary | ICD-10-CM | POA: Diagnosis not present

## 2018-06-08 LAB — CUP PACEART REMOTE DEVICE CHECK
Battery Remaining Percentage: 59 %
Implantable Lead Implant Date: 20160707
Implantable Lead Location: 753858
Implantable Lead Model: 3401
Implantable Pulse Generator Implant Date: 20160707
MDC IDC SESS DTM: 20200304121300
Pulse Gen Serial Number: 115852

## 2018-06-12 ENCOUNTER — Other Ambulatory Visit: Payer: Self-pay

## 2018-06-12 MED ORDER — SACUBITRIL-VALSARTAN 49-51 MG PO TABS: 1.0000 | ORAL_TABLET | Freq: Two times a day (BID) | ORAL | 10 refills | Status: DC

## 2018-06-13 NOTE — Progress Notes (Signed)
Remote ICD transmission.   

## 2018-06-14 ENCOUNTER — Telehealth: Payer: Self-pay

## 2018-06-14 NOTE — Telephone Encounter (Signed)
Author phoned pt. to attempt to reschedule 6/4 AWV appointment. Appointment rescheduled for 7/10. Pt. Declined need to make an appointment with Dr. Maudie Mercury at this time.

## 2018-06-18 ENCOUNTER — Other Ambulatory Visit (HOSPITAL_COMMUNITY): Payer: Medicare Other

## 2018-08-14 ENCOUNTER — Telehealth: Payer: Self-pay | Admitting: *Deleted

## 2018-08-14 NOTE — Telephone Encounter (Signed)
LVM  TO CALL [facility] APPT

## 2018-08-16 ENCOUNTER — Telehealth: Payer: Self-pay | Admitting: Physician Assistant

## 2018-08-16 NOTE — Telephone Encounter (Signed)
°  5/15 : Spoke to patient.      VIDEO/[email or web address] visit on 08/20/2018     Consent sent via MyChart     -    08/16/2018

## 2018-08-19 NOTE — Progress Notes (Signed)
Virtual Visit via Video Note   This visit type was conducted due to national recommendations for restrictions regarding the COVID-19 Pandemic (e.g. social distancing) in an effort to limit this patient's exposure and mitigate transmission in our community.  Due to her co-morbid illnesses, this patient is at least at moderate risk for complications without adequate follow up.  This format is felt to be most appropriate for this patient at this time.  All issues noted in this document were discussed and addressed.  A limited physical exam was performed with this format.  Please refer to the patient's chart for her consent to telehealth for [facility].   Date:  08/20/2018   ID:  Paula Stein, DOB [birth year], MRN [id number]  Patient Location: Home Provider Location: Home  PCP:  Lucretia Kern, DO  Cardiologist:  [name], MD   Electrophysiologist:  Virl Axe, MD   Evaluation Performed:  Follow-Up Visit  Chief Complaint:  Follow up on CHF  History of Present Illness:    Paula Stein is a 73 y.o. female with heart failure with reduced ejection fraction secondary to non-ischemic cardiomyopathy, s/p ICD, hypertension, diabetes, hyperthyroidism s/p RAI ablation.  She was last seen by Dr. Burt Knack in 02/2018.    Today, she notes she is doing well.  Her weights are stable.  She notes dyspnea with some activities but this is stable.  She denies chest pain, syncope, paroxysmal nocturnal dyspnea, orthopnea, leg swelling.  Her biggest issue is with insomnia.    The patient does not have symptoms concerning for COVID-19 infection (fever, chills, cough, or new shortness of breath).    Past Medical History:  Diagnosis Date  . AICD (automatic cardioverter/defibrillator) present 10/08/2014   SUBQ    /    DR [name]  . CHF (congestive heart failure) ([place])   . Diabetes mellitus without complication ([place])   . History of cardiac cath 05/2007   normal-with patent coronaries  . History of  colonoscopy   . History of hiatal hernia   . Hypertension   . Iatrogenic thyroiditis   . Non-ischemic cardiomyopathy ([place])    EF 28%- reassessment of LV function 2011 with LVEf 45-50%  . PAD (peripheral artery disease) (HCC)    lower extremities with ABIs of 0.5 bilaterally  . Personal history of goiter   . S/P radioactive iodine thyroid ablation   . S/P thyroidectomy   . Shortness of breath dyspnea    Past Surgical History:  Procedure Laterality Date  . EP IMPLANTABLE DEVICE N/A 10/08/2014   Procedure: SubQ ICD Implant;  Surgeon: [name], MD;  Location: [facility];  Service: Cardiovascular;  Laterality: N/A;  . THYROIDECTOMY    . TONSILLECTOMY    . TOTAL ABDOMINAL HYSTERECTOMY       Current Meds  Medication Sig  . aspirin 81 MG tablet Take 81 mg by mouth daily.    Marland Kitchen atorvastatin (LIPITOR) 40 MG tablet Take 1.5 tablets (60 mg total) by mouth daily.  . carvedilol (COREG) 25 MG tablet Take 1 tablet (25 mg total) by mouth 2 (two) times daily with a meal.  . furosemide (LASIX) 40 MG tablet Take 1 tablet (40 mg total) by mouth 2 (two) times daily.  Marland Kitchen glucose blood (ONETOUCH VERIO) test strip Use as instructed to check blood sugar once a day  . hydroxychloroquine (PLAQUENIL) 200 MG tablet Take 1 tablet by mouth 2 (two) times daily.  . isosorbide mononitrate (IMDUR) 30 MG 24  hr tablet Take 1 tablet (30 mg total) by mouth daily.  Marland Kitchen levothyroxine (SYNTHROID, LEVOTHROID) 150 MCG tablet TAKE 1 TABLET(150 MCG) BY MOUTH DAILY (Patient taking differently: Take 150 mcg by mouth every morning. )  . ONETOUCH DELICA LANCETS 02H MISC 1 Device by Other route daily.  . predniSONE (DELTASONE) 10 MG tablet Take 1 tablet by mouth daily.  . sacubitril-valsartan (ENTRESTO) 49-51 MG Take 1 tablet by mouth 2 (two) times daily.  Marland Kitchen spironolactone (ALDACTONE) 50 MG tablet Take 1 tablet (50 mg total) by mouth daily.   Current Facility-Administered Medications for the 08/20/18 encounter  (Telemedicine) with Richardson Dopp T, PA-C  Medication  . triamcinolone acetonide (KENALOG) 10 MG/ML injection 10 mg     Allergies:   Ibuprofen   Social History   Tobacco Use  . Smoking status: Former Smoker    Packs/day: 0.25    Years: 40.00    Pack years: 10.00    Types: Cigarettes    Last attempt to quit: 07/03/2018    Years since quitting: 0.1  . Smokeless tobacco: Never Used  . Tobacco comment: up to 1 a day  Substance Use Topics  . Alcohol use: Yes    Alcohol/week: 0.0 standard drinks    Comment: rare glass of wine  . Drug use: No     Family Hx: The patient's family history includes Diabetes in her mother; Diabetes type II in her mother; Heart disease in her mother; Hyperlipidemia in her father; Hypertension in her mother; Other in her father.  ROS:   Please see the history of present illness.     All other systems reviewed and are negative.   Prior CV studies:   The following studies were reviewed today:  Echo 06/04/2018 EF 30-35, mild conc LVH, Gr 2 DD, mod reduced RVSF, mod LAE, trivial eff, mild to mod MR, mod diff plaque in aortic arch  Echo 03/13/18 Mild LVH, EF 30-35, diff HK, Gr 3 DD, mild MR, severe LAE  Echo 05/22/17 Severe conc LVH, EF 25-30, Gr 1 DD, mild MR, severe LAE, reduced RVSF, trivial TR, PASP 24, trivial pericardial effusion  Cardiac Catheterization 05/13/2007 LAD prox 30, mid 20; Dx prox 50 RCA irregs, mid 30 EF 35, 2+ MR  Labs/Other Tests and Data Reviewed:    EKG:  No ECG reviewed.  Recent Labs: 03/13/2018: TSH 4.651 03/14/2018: B Natriuretic Peptide 587.2; BUN 26; Creatinine, Ser 1.57; Hemoglobin 12.3; Platelets 247; Potassium 3.9; Sodium 140   Recent Lipid Panel Lab Results  Component Value Date/Time   CHOL 234 (H) 04/13/2017 09:18 AM   TRIG 122.0 04/13/2017 09:18 AM   HDL 67.00 04/13/2017 09:18 AM   CHOLHDL 3 04/13/2017 09:18 AM   LDLCALC 142 (H) 04/13/2017 09:18 AM   LDLDIRECT 195.2 02/15/2011 04:25 PM     Wt Readings  from Last 3 Encounters:  08/20/18 205 lb (93 kg)  04/25/18 201 lb 12.8 oz (91.5 kg)  03/14/18 191 lb 9.6 oz (86.9 kg)     Objective:    Vital Signs:  BP 140/80   Ht 5\' 10"  (1.778 m)   Wt 205 lb (93 kg)   BMI 29.41 kg/m    VITAL SIGNS:  reviewed GEN:  no acute distress RESPIRATORY:  normal respiratory effort NEURO:  alert and oriented x 3, no obvious focal deficit PSYCH:  normal affect  ASSESSMENT & PLAN:    Chronic combined systolic and diastolic CHF (congestive heart failure) (HCC) EF 30-35 by Echo in 06/2018.  NYHA  2.  Volume status is stable.  Continue beta-blocker, angiotensin receptor neprilysin inhibitor, mineralocorticoid receptor antagonist, nitrates.  Follow up in 6 mos.  Essential hypertension BP elevated this AM but has not taken any medications yet.  Her BP is usually optimal.  Continue current Rx.  Diabetes mellitus without complication ([place]) She was intol of Metformin.  Continue follow up with primary care.  Consider empagliflozin given CV benefits.   ICD (implantable cardioverter-defibrillator) in place Follow up with EP as planned.  Mixed hyperlipidemia Mild plaque noted on Cardiac Catheterization in 2009.  Continue statin Rx.  Consider follow up fasting Lipids at next office visit.  Insomnia, unspecified type She has tried OTC remedies without relief.  She should follow up with her PCP for management.  From a cardiology perspective, sleep aids such as Ambien are ok for her to use.    Educated About Covid-19 Virus Infection The signs and symptoms of COVID-19 were discussed with the patient and how to seek care for testing (follow up with PCP or arrange E-visit).  The importance of social distancing was discussed today.  Time:   Today, I have spent 10.5 minutes with the patient with telehealth technology discussing the above problems.     Medication Adjustments/Labs and Tests Ordered: Current medicines are reviewed at length with the patient today.   Concerns regarding medicines are outlined above.   Tests Ordered: No orders of the defined types were placed in this encounter.   Medication Changes: No orders of the defined types were placed in this encounter.   Disposition:  Follow up in 6 month(s)  Signed, Richardson Dopp, PA-C  08/20/2018 4:39 PM    [facility]

## 2018-08-20 ENCOUNTER — Other Ambulatory Visit: Payer: Self-pay

## 2018-08-20 ENCOUNTER — Telehealth (INDEPENDENT_AMBULATORY_CARE_PROVIDER_SITE_OTHER): Payer: Medicare Other | Admitting: Physician Assistant

## 2018-08-20 ENCOUNTER — Ambulatory Visit: Payer: Medicare Other | Admitting: Physician Assistant

## 2018-08-20 ENCOUNTER — Encounter: Payer: Self-pay | Admitting: Physician Assistant

## 2018-08-20 VITALS — BP 140/80 | Ht 70.0 in | Wt 205.0 lb

## 2018-08-20 DIAGNOSIS — E119 Type 2 diabetes mellitus without complications: Secondary | ICD-10-CM

## 2018-08-20 DIAGNOSIS — Z95810 Presence of automatic (implantable) cardiac defibrillator: Secondary | ICD-10-CM

## 2018-08-20 DIAGNOSIS — G4700 Insomnia, unspecified: Secondary | ICD-10-CM

## 2018-08-20 DIAGNOSIS — I5042 Chronic combined systolic (congestive) and diastolic (congestive) heart failure: Secondary | ICD-10-CM

## 2018-08-20 DIAGNOSIS — E782 Mixed hyperlipidemia: Secondary | ICD-10-CM

## 2018-08-20 DIAGNOSIS — I10 Essential (primary) hypertension: Secondary | ICD-10-CM

## 2018-08-20 DIAGNOSIS — Z7189 Other specified counseling: Secondary | ICD-10-CM

## 2018-08-20 NOTE — Patient Instructions (Signed)
Medication Instructions:  No changes  If you need a refill on your cardiac medications before your next appointment, please call your pharmacy.   Lab work: None   If you have labs (blood work) drawn today and your tests are completely normal, you will receive your results only by: Marland Kitchen MyChart Message (if you have MyChart) OR . A paper copy in the mail If you have any lab test that is abnormal or we need to change your treatment, we will call you to review the results.  Testing/Procedures: None   Follow-Up: At [facility], you and your health needs are our priority.  As part of our continuing mission to provide you with exceptional heart care, we have created designated Provider Care Teams.  These Care Teams include your primary Cardiologist (physician) and Advanced Practice Providers (APPs -  Physician Assistants and Nurse Practitioners) who all work together to provide you with the care you need, when you need it. You will need a follow up appointment in:  6 months.  Please call our office 2 months in advance to schedule this appointment.  You may see [name], MD or Richardson Dopp, PA-C  Any Other Special Instructions Will Be Listed Below (If Applicable).

## 2018-08-27 ENCOUNTER — Other Ambulatory Visit: Payer: Self-pay | Admitting: Internal Medicine

## 2018-08-27 ENCOUNTER — Other Ambulatory Visit: Payer: Self-pay | Admitting: Family Medicine

## 2018-08-27 DIAGNOSIS — I10 Essential (primary) hypertension: Secondary | ICD-10-CM

## 2018-08-27 DIAGNOSIS — E7849 Other hyperlipidemia: Secondary | ICD-10-CM

## 2018-08-29 ENCOUNTER — Telehealth: Payer: Self-pay

## 2018-08-29 NOTE — Telephone Encounter (Signed)
PA for atorvastatin (LIPITOR) 40 MG tablet has been sent to cover my meds.  KeyChristiane Ha - PA Case ID: [id number]

## 2018-09-02 NOTE — Telephone Encounter (Signed)
PA has been approved through 04/03/2019. Patient is aware.

## 2018-09-04 ENCOUNTER — Encounter: Payer: Medicare Other | Admitting: *Deleted

## 2018-09-05 ENCOUNTER — Telehealth: Payer: Self-pay

## 2018-09-05 ENCOUNTER — Ambulatory Visit: Payer: Medicare Other

## 2018-09-05 NOTE — Telephone Encounter (Signed)
Left message for patient to remind of missed remote transmission.  

## 2018-09-09 ENCOUNTER — Telehealth: Payer: Self-pay | Admitting: *Deleted

## 2018-09-09 NOTE — Telephone Encounter (Signed)
Spoke with patient for visit week 2 in the [employer] HF study.  No aes, saes or peps to report.  Medication delivered by curb side pickup due to covid 19 restrictions in the facility.

## 2018-09-10 ENCOUNTER — Ambulatory Visit (INDEPENDENT_AMBULATORY_CARE_PROVIDER_SITE_OTHER): Payer: Medicare Other | Admitting: *Deleted

## 2018-09-10 DIAGNOSIS — I428 Other cardiomyopathies: Secondary | ICD-10-CM | POA: Diagnosis not present

## 2018-09-10 DIAGNOSIS — I5022 Chronic systolic (congestive) heart failure: Secondary | ICD-10-CM

## 2018-09-10 LAB — CUP PACEART REMOTE DEVICE CHECK
Battery Remaining Percentage: 56 %
Date Time Interrogation Session: 20200608180900
Implantable Lead Implant Date: 20160707
Implantable Lead Location: 753858
Implantable Lead Model: 3401
Implantable Pulse Generator Implant Date: 20160707
Pulse Gen Serial Number: 115852

## 2018-09-11 ENCOUNTER — Other Ambulatory Visit: Payer: Self-pay | Admitting: Internal Medicine

## 2018-09-19 NOTE — Progress Notes (Signed)
Remote ICD transmission.   

## 2018-09-29 ENCOUNTER — Encounter (HOSPITAL_COMMUNITY): Payer: Self-pay | Admitting: Emergency Medicine

## 2018-09-29 ENCOUNTER — Ambulatory Visit (HOSPITAL_COMMUNITY)
Admission: EM | Admit: 2018-09-29 | Discharge: 2018-09-29 | Disposition: A | Discharge status: Home / Self Care | Payer: Medicare Other | Attending: Emergency Medicine | Admitting: Emergency Medicine

## 2018-09-29 ENCOUNTER — Other Ambulatory Visit: Payer: Self-pay

## 2018-09-29 DIAGNOSIS — R109 Unspecified abdominal pain: Secondary | ICD-10-CM | POA: Insufficient documentation

## 2018-09-29 LAB — CBC
HCT: 38.8 % (ref 36.0–46.0)
Hemoglobin: 12.3 g/dL (ref 12.0–15.0)
MCH: 29.9 pg (ref 26.0–34.0)
MCHC: 31.7 g/dL (ref 30.0–36.0)
MCV: 94.4 fL (ref 80.0–100.0)
Platelets: 262 10*3/uL (ref 150–400)
RBC: 4.11 MIL/uL (ref 3.87–5.11)
RDW: 12.5 % (ref 11.5–15.5)
WBC: 11.6 10*3/uL — ABNORMAL HIGH (ref 4.0–10.5)
nRBC: 0 % (ref 0.0–0.2)

## 2018-09-29 LAB — COMPREHENSIVE METABOLIC PANEL
ALT: 19 U/L (ref 0–44)
AST: 16 U/L (ref 15–41)
Albumin: 3.1 g/dL — ABNORMAL LOW (ref 3.5–5.0)
Alkaline Phosphatase: 95 U/L (ref 38–126)
Anion gap: 8 (ref 5–15)
BUN: 22 mg/dL (ref 8–23)
CO2: 23 mmol/L (ref 22–32)
Calcium: 9.3 mg/dL (ref 8.9–10.3)
Chloride: 106 mmol/L (ref 98–111)
Creatinine, Ser: 1.48 mg/dL — ABNORMAL HIGH (ref 0.44–1.00)
GFR calc Af Amer: 40 mL/min — ABNORMAL LOW (ref >60)
GFR calc non Af Amer: 34 mL/min — ABNORMAL LOW (ref >60)
Glucose, Bld: 142 mg/dL — ABNORMAL HIGH (ref 70–99)
Potassium: 4.1 mmol/L (ref 3.5–5.1)
Sodium: 137 mmol/L (ref 135–145)
Total Bilirubin: 0.8 mg/dL (ref 0.3–1.2)
Total Protein: 7.7 g/dL (ref 6.5–8.1)

## 2018-09-29 LAB — LIPASE, BLOOD: Lipase: 26 U/L (ref 11–51)

## 2018-09-29 MED ORDER — TIZANIDINE HCL 2 MG PO TABS: 2.0000 mg | ORAL_TABLET | Freq: Three times a day (TID) | ORAL | 0 refills | Status: DC | PRN

## 2018-09-29 MED ORDER — ACETAMINOPHEN 500 MG PO TABS: 500.0000 mg | ORAL_TABLET | Freq: Four times a day (QID) | ORAL | 0 refills | Status: DC | PRN

## 2018-09-29 NOTE — ED Provider Notes (Signed)
[facility]    CSN: [id number] Arrival date & time: 09/29/18  1008      History   Chief Complaint Chief Complaint  Patient presents with  . Abdominal Cramping    strain muscles    HPI Paula Stein is a 73 y.o. female.   Paula Stein presents with complaints of abdominal pain and "soreness" which started yesterday. She feels it started after lifting a pot which was heavy, two days prior. States has had similar in the past with lifting something, which resolved with pain management. Yesterday had an episode of vomiting up "clear" but no other nausea or vomiting. States she has been eating and drinking normally. States she otherwise feels well. Yesterday pain radiated to her back, but hasn't since. No diarrhea. No fevers. No cough, shortness of breath , arm pain, jaw pain or diaphoresis. Pain is primarily with certain movements or with walking. No pain at rest. Indicates pain is to mid abdomen/ umbilical region. No urinary symptoms. Took aleve yesterday which helped her sleep. Denies any current pain while at rest. Hx of CHF with AICD in place, thyroidectomy, htn. Per chart review cardiac cath last in 2009 which was patent.    ROS per HPI, negative if not otherwise mentioned.      Past Medical History:  Diagnosis Date  . AICD (automatic cardioverter/defibrillator) present 10/08/2014   SUBQ    /    DR [name]  . CHF (congestive heart failure) ([place])   . Diabetes mellitus without complication ([place])   . History of cardiac cath 05/2007   normal-with patent coronaries  . History of colonoscopy   . History of hiatal hernia   . Hypertension   . Iatrogenic thyroiditis   . Non-ischemic cardiomyopathy ([place])    EF 28%- reassessment of LV function 2011 with LVEf 45-50%  . PAD (peripheral artery disease) (HCC)    lower extremities with ABIs of 0.5 bilaterally  . Personal history of goiter   . S/P radioactive iodine thyroid ablation   . S/P thyroidectomy   . Shortness of  breath dyspnea     Patient Active Problem List   Diagnosis Date Noted  . Acute on chronic combined systolic and diastolic CHF (congestive heart failure) ([place]) 03/13/2018  . Non-ischemic cardiomyopathy ([place])   . Hypertension   . Diabetes mellitus without complication ([place])   . CAD (coronary artery disease) 06/18/2017  . PAD (peripheral artery disease) ([place]) 06/18/2017  . ICD (implantable cardioverter-defibrillator) in place 06/18/2017  . Chronic combined systolic and diastolic CHF (congestive heart failure) ([place]) 05/22/2017  . Hyperlipidemia associated with type 2 diabetes mellitus ([place]) 12/11/2016  . Treatment-emergent central sleep apnea 09/20/2016  . NICM (nonischemic cardiomyopathy) ([place]) 10/08/2014  . AICD (automatic cardioverter/defibrillator) present 10/08/2014  . Cigarette smoker 10/30/2012  . 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 DeQuervain's disease (tenosynovitis) 09/01/2010  . Atherosclerosis 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 ([place]) 12/07/2008  . Diabetes ([place]) 10/29/2008  . Hypothyroidism 01/31/2007  . Essential hypertension 01/31/2007  . GERD 01/31/2007    Past Surgical History:  Procedure Laterality Date  . EP IMPLANTABLE DEVICE N/A 10/08/2014   Procedure: SubQ ICD Implant;  Surgeon: [name], MD;  Location: [facility];  Service: Cardiovascular;  Laterality: N/A;  . THYROIDECTOMY    . TONSILLECTOMY    . TOTAL ABDOMINAL HYSTERECTOMY      OB History   No obstetric history on file.      Home Medications  Prior to Admission medications   Medication Sig Start Date End Date Taking? Authorizing Provider  acetaminophen (TYLENOL) 500 MG tablet Take 1 tablet (500 mg total) by mouth every 6 (six) hours as needed. 09/29/18   Zigmund Gottron, NP  aspirin 81 MG tablet Take 81 mg by mouth daily.      [provider]  atorvastatin (LIPITOR) 40 MG tablet TAKE 1 AND 1/2 TABLETS(60 MG) BY MOUTH DAILY 08/27/18   Colin Benton R, DO  carvedilol (COREG) 25 MG tablet TAKE 1 TABLET BY MOUTH TWICE A DAY WITH FOOD 09/11/18   [name], MD  furosemide (LASIX) 40 MG tablet TAKE 1 TABLET BY MOUTH TWICE DAILY 08/27/18   [name], MD  glucose blood [facility] VERIO) test strip Use as instructed to check blood sugar once a day 06/09/16   Lucretia Kern, DO  hydroxychloroquine (PLAQUENIL) 200 MG tablet Take 1 tablet by mouth 2 (two) times daily. 11/01/17   [provider]  isosorbide mononitrate (IMDUR) 30 MG 24 hr tablet Take 1 tablet (30 mg total) by mouth daily. 02/15/18 02/10/19  [name], MD  levothyroxine (SYNTHROID, LEVOTHROID) 150 MCG tablet TAKE 1 TABLET(150 MCG) BY MOUTH DAILY Patient taking differently: Take 150 mcg by mouth every morning.  02/14/18   Lucretia Kern, DO  ONETOUCH DELICA LANCETS 54M MISC 1 Device by Other route daily. 06/09/16   Lucretia Kern, DO  predniSONE (DELTASONE) 10 MG tablet Take 1 tablet by mouth daily. 11/01/17   [provider]  sacubitril-valsartan (ENTRESTO) 49-51 MG Take 1 tablet by mouth 2 (two) times daily. 06/12/18   [name], MD  spironolactone (ALDACTONE) 50 MG tablet Take 1 tablet (50 mg total) by mouth daily. 02/15/18 02/10/19  [name], MD  tiZANidine (ZANAFLEX) 2 MG tablet Take 1 tablet (2 mg total) by mouth every 8 (eight) hours as needed for muscle spasms. 09/29/18   Zigmund Gottron, NP    Family History Family History  Problem Relation Age of Onset  . Diabetes type II Mother   . Hypertension Mother   . Diabetes Mother   . Heart disease Mother   . Other Father        deceased from accident age 32  . Hyperlipidemia Father     Social History Social History   Tobacco Use  . Smoking status: Former Smoker    Packs/day: 0.25    Years: 40.00    Pack years: 10.00    Types: Cigarettes    Quit date: 07/03/2018    Years since quitting: 0.2  . Smokeless tobacco: Never Used  . Tobacco comment: up to 1 a day  Substance Use Topics  .  Alcohol use: Yes    Alcohol/week: 0.0 standard drinks    Comment: rare glass of wine  . Drug use: No     Allergies   Ibuprofen   Review of Systems Review of Systems   Physical Exam Triage Vital Signs ED Triage Vitals [09/29/18 1029]  Enc Vitals Group     BP (!) 152/100     Pulse Rate 78     Resp 16     Temp 98.5 F (36.9 C)     Temp Source Oral     SpO2 100 %     Weight      Height      Head Circumference      Peak Flow      Pain Score 5  Pain Loc      Pain Edu?      Excl. in [place]?    No data found.  Updated Vital Signs BP (!) 152/100 (BP Location: Right Arm)   Pulse 78   Temp 98.5 F (36.9 C) (Oral)   Resp 16   SpO2 100%    Physical Exam Constitutional:      General: She is not in acute distress.    Appearance: She is well-developed.  Cardiovascular:     Rate and Rhythm: Normal rate.  Pulmonary:     Effort: Pulmonary effort is normal.  Abdominal:     Tenderness: There is abdominal tenderness in the periumbilical area and left upper quadrant. There is no right CVA tenderness, left CVA tenderness, guarding or rebound.       Comments: No pain with engaging of the core with transitioning from sit to laying or laying to sit; pain with palpation, primarily to LUQ and mildly to periumbilical region  Skin:    General: Skin is warm and dry.  Neurological:     Mental Status: She is alert and oriented to person, place, and time.    EKG:  NSR rate of 72. Previous EKG was available for review. No stwave changes as interpreted by me.    UC Treatments / Results  Labs (all labs ordered are listed, but only abnormal results are displayed) Labs Reviewed  CBC  COMPREHENSIVE METABOLIC PANEL  LIPASE, BLOOD    EKG None  Radiology No results found.  Procedures Procedures (including critical care time)  Medications Ordered in UC Medications - No data to display  Initial Impression / Assessment and Plan / UC Course  I have reviewed the triage vital  signs and the nursing notes.  Pertinent labs & imaging results that were available during my care of the patient were reviewed by me and considered in my medical decision making (see chart for details).     Ekg without acute findings here today. Abdominal pain is reproducible on palpation without indications of acute surgical abdomen. Basic labs and lipase obtained. Will notify of any positive findings and if any changes to treatment are needed.  Patient without any further nausea, vomiting or diarrhea. No fevers. Has had similar with lifting in the past. Pain management discussed. Return precautions provided. Patient verbalized understanding and agreeable to plan.  Ambulatory out of clinic without difficulty.   Final Clinical Impressions(s) / UC Diagnoses   Final diagnoses:  Abdominal pain, unspecified abdominal location     Discharge Instructions     Light activity as tolerated, rest.  Tylenol as needed for pain. Muscle relaxer as needed. May cause drowsiness. Please do not take if driving or drinking alcohol.   Will notify you of any positive findings from your lab testing and if any changes to treatment are needed.   Please go to the ER if you develop chest pain , shortness of breath , fevers, increased pain or otherwise worsening.    ED Prescriptions    Medication Sig Dispense Auth. Provider   tiZANidine (ZANAFLEX) 2 MG tablet Take 1 tablet (2 mg total) by mouth every 8 (eight) hours as needed for muscle spasms. 20 tablet Augusto Gamble B, NP   acetaminophen (TYLENOL) 500 MG tablet Take 1 tablet (500 mg total) by mouth every 6 (six) hours as needed. 30 tablet Zigmund Gottron, NP     Controlled Substance Prescriptions [place] Controlled Substance Registry consulted? Not Applicable   Zigmund Gottron, NP 09/29/18  1107  

## 2018-09-29 NOTE — ED Triage Notes (Signed)
Pt was moving flower pots on Thursday and was bending lifting and the next day her stomach muscles have been tender. More with movement and lifting.

## 2018-09-29 NOTE — Discharge Instructions (Signed)
Light activity as tolerated, rest.  Tylenol as needed for pain. Muscle relaxer as needed. May cause drowsiness. Please do not take if driving or drinking alcohol.   Will notify you of any positive findings from your lab testing and if any changes to treatment are needed.   Please go to the ER if you develop chest pain , shortness of breath , fevers, increased pain or otherwise worsening.

## 2018-09-29 NOTE — ED Notes (Signed)
EKG shown to Rondel Oh, NP.

## 2018-09-30 ENCOUNTER — Telehealth (HOSPITAL_COMMUNITY): Payer: Self-pay | Admitting: Emergency Medicine

## 2018-09-30 NOTE — Telephone Encounter (Signed)
Attempted to reach patient. No answer at this time.   

## 2018-10-08 ENCOUNTER — Other Ambulatory Visit: Payer: Self-pay | Admitting: *Deleted

## 2018-10-08 DIAGNOSIS — Z20822 Contact with and (suspected) exposure to covid-19: Secondary | ICD-10-CM

## 2018-10-08 NOTE — Progress Notes (Signed)
lab

## 2018-10-11 ENCOUNTER — Other Ambulatory Visit: Payer: Self-pay | Admitting: Family Medicine

## 2018-10-11 ENCOUNTER — Ambulatory Visit: Payer: Medicare Other

## 2018-10-14 ENCOUNTER — Other Ambulatory Visit: Payer: Self-pay

## 2018-10-14 ENCOUNTER — Encounter: Payer: Medicare Other | Admitting: *Deleted

## 2018-10-14 VITALS — BP 150/82 | HR 84 | Temp 98.1°F | Resp 18 | Wt 204.8 lb

## 2018-10-14 DIAGNOSIS — Z006 Encounter for examination for normal comparison and control in clinical research program: Secondary | ICD-10-CM

## 2018-10-14 LAB — NOVEL CORONAVIRUS, NAA: SARS-CoV-2, NAA: NOT DETECTED

## 2018-10-14 NOTE — Research (Addendum)
Subject to clinic for week 288/EOS visit for [employer] HF study. No SE/SAE/PEP to report. Ask subject about the week 12 IP that was not returned, subject stated "I took all the pills and threw away the box. And the box that I received in January all the pills were taken and the boxes were  thrown away in the move to [employer] were drawn. I thanked her for her participation in the trial.

## 2018-10-30 ENCOUNTER — Ambulatory Visit (INDEPENDENT_AMBULATORY_CARE_PROVIDER_SITE_OTHER): Payer: Medicare Other | Admitting: Internal Medicine

## 2018-10-30 ENCOUNTER — Encounter: Payer: Self-pay | Admitting: Internal Medicine

## 2018-10-30 ENCOUNTER — Other Ambulatory Visit: Payer: Self-pay

## 2018-10-30 DIAGNOSIS — Z87820 Personal history of traumatic brain injury: Secondary | ICD-10-CM

## 2018-10-30 DIAGNOSIS — G4731 Primary central sleep apnea: Secondary | ICD-10-CM

## 2018-10-30 DIAGNOSIS — Z7982 Long term (current) use of aspirin: Secondary | ICD-10-CM | POA: Diagnosis not present

## 2018-10-30 DIAGNOSIS — I10 Essential (primary) hypertension: Secondary | ICD-10-CM | POA: Diagnosis not present

## 2018-10-30 DIAGNOSIS — N189 Chronic kidney disease, unspecified: Secondary | ICD-10-CM

## 2018-10-30 DIAGNOSIS — G4452 New daily persistent headache (NDPH): Secondary | ICD-10-CM | POA: Diagnosis not present

## 2018-10-30 DIAGNOSIS — Z95810 Presence of automatic (implantable) cardiac defibrillator: Secondary | ICD-10-CM

## 2018-10-30 NOTE — Progress Notes (Signed)
Virtual Visit via Video Note  I connected with@ on 10/30/18 at  3:00 PM EDT by a video enabled telemedicine application and verified that I am speaking with the correct person using two identifiers. Location patient: home Location provider:work  office Persons participating in the virtual visit: patient, provider  WIth national recommendations  regarding COVID 19 pandemic   video visit is advised over in office visit for this patient.  Patient aware  of the limitations of evaluation and management by telemedicine and  availability of limited   n person appointments. and agreed to proceed.   HPI: Paula Stein presents for video visit  PXP NA  Never had a problem with headaches but now has had  Ha off and on 3 weeks ago and then increasing  To be  Continuous over last weeks or so   Top of head and worse with bending over and no assoc sx of fever  Diplopia   No weakness falling   Had nit head  Left temple getting out of bed  Hitting head and had lump  June 15 and sought care at urgent care  June 21 was given tizanidine to help  But didn't have HA then   No xp sob new swelling   Has DM  And ok  On ASA no bleeding  BP 164/76 recently  And then 143 184 and 147 .  Has osa but no cpap in last week.  [place]id [place]  Exposure but negative   ROS: See pertinent positives and negatives per HPI. No fallin balance  Numbness  Has aicd and  No hx of  activation  Past Medical History:  Diagnosis Date  . AICD (automatic cardioverter/defibrillator) present 10/08/2014   SUBQ    /    DR [name]  . CHF (congestive heart failure) ([place])   . Diabetes mellitus without complication ([place])   . History of cardiac cath 05/2007   normal-with patent coronaries  . History of colonoscopy   . History of hiatal hernia   . Hypertension   . Iatrogenic thyroiditis   . Non-ischemic cardiomyopathy ([place])    EF 28%- reassessment of LV function 2011 with LVEf 45-50%  . PAD (peripheral artery disease)  (HCC)    lower extremities with ABIs of 0.5 bilaterally  . Personal history of goiter   . S/P radioactive iodine thyroid ablation   . S/P thyroidectomy   . Shortness of breath dyspnea     Past Surgical History:  Procedure Laterality Date  . EP IMPLANTABLE DEVICE N/A 10/08/2014   Procedure: SubQ ICD Implant;  Surgeon: [name], MD;  Location: [place] [facility];  Service: Cardiovascular;  Laterality: N/A;  . THYROIDECTOMY    . TONSILLECTOMY    . TOTAL ABDOMINAL HYSTERECTOMY      Family History  Problem Relation Age of Onset  . Diabetes type II Mother   . Hypertension Mother   . Diabetes Mother   . Heart disease Mother   . Other Father        deceased from accident age 75  . Hyperlipidemia Father     Social History   Tobacco Use  . Smoking status: Former Smoker    Packs/day: 0.25    Years: 40.00    Pack years: 10.00    Types: Cigarettes    Quit date: 07/03/2018    Years since quitting: 0.3  . Smokeless tobacco: Never Used  . Tobacco comment: up to 1 a day  Substance Use Topics  . Alcohol use: Yes    Alcohol/week: 0.0 standard drinks    Comment: rare glass of wine  . Drug use: No      Current Outpatient Medications:  .  acetaminophen (TYLENOL) 500 MG tablet, Take 1 tablet (500 mg total) by mouth every 6 (six) hours as needed., Disp: 30 tablet, Rfl: 0 .  aspirin 81 MG tablet, Take 81 mg by mouth daily.  , Disp: , Rfl:  .  atorvastatin (LIPITOR) 40 MG tablet, TAKE 1 AND 1/2 TABLETS(60 MG) BY MOUTH DAILY, Disp: 135 tablet, Rfl: 0 .  carvedilol (COREG) 25 MG tablet, TAKE 1 TABLET BY MOUTH TWICE A DAY WITH FOOD, Disp: 180 tablet, Rfl: 2 .  furosemide (LASIX) 40 MG tablet, TAKE 1 TABLET BY MOUTH TWICE DAILY, Disp: 180 tablet, Rfl: 1 .  glucose blood (ONETOUCH VERIO) test strip, Use as instructed to check blood sugar once a day, Disp: 100 each, Rfl: 3 .  hydroxychloroquine (PLAQUENIL) 200 MG tablet, Take 1 tablet by mouth 2 (two) times daily., Disp: , Rfl: 3 .   isosorbide mononitrate (IMDUR) 30 MG 24 hr tablet, Take 1 tablet (30 mg total) by mouth daily., Disp: 90 tablet, Rfl: 3 .  levothyroxine (SYNTHROID) 150 MCG tablet, TAKE 1 TABLET(150 MCG) BY MOUTH DAILY, Disp: 90 tablet, Rfl: 0 .  ONETOUCH DELICA LANCETS 08X MISC, 1 Device by Other route daily., Disp: 100 each, Rfl: 3 .  predniSONE (DELTASONE) 10 MG tablet, Take 1 tablet by mouth daily., Disp: , Rfl: 3 .  sacubitril-valsartan (ENTRESTO) 49-51 MG, Take 1 tablet by mouth 2 (two) times daily., Disp: 60 tablet, Rfl: 10 .  spironolactone (ALDACTONE) 50 MG tablet, Take 1 tablet (50 mg total) by mouth daily., Disp: 90 tablet, Rfl: 3 .  tiZANidine (ZANAFLEX) 2 MG tablet, Take 1 tablet (2 mg total) by mouth every 8 (eight) hours as needed for muscle spasms., Disp: 20 tablet, Rfl: 0  Current Facility-Administered Medications:  .  triamcinolone acetonide (KENALOG) 10 MG/ML injection 10 mg, 10 mg, Other, Once, Stover, Herminie, DPM  EXAM: BP Readings from Last 3 Encounters:  10/14/18 (!) 150/82  09/29/18 (!) 152/100  08/20/18 140/80    VITALS per patient if applicable: GENERAL: alert, oriented, appears well and in no acute distress HEENT: atraumatic, conjunttiva clear, no obvious abnormalities on inspection of external nose and ears NECK: normal movements of the head and neck LUNGS: on inspection no signs of respiratory distress, breathing rate appears normal, no obvious gross SOB, gasping or wheezing CV: no obvious cyanosis MS: moves all visible extremities without noticeable abnormality PSYCH/NEURO: pleasant and cooperative, no obvious depression or anxiety, speech and thought processing grossly intact Lab Results  Component Value Date   WBC 11.6 (H) 09/29/2018   HGB 12.3 09/29/2018   HCT 38.8 09/29/2018   PLT 262 09/29/2018   GLUCOSE 142 (H) 09/29/2018   CHOL 234 (H) 04/13/2017   TRIG 122.0 04/13/2017   HDL 67.00 04/13/2017   LDLDIRECT 195.2 02/15/2011   LDLCALC 142 (H) 04/13/2017   ALT  19 09/29/2018   AST 16 09/29/2018   NA 137 09/29/2018   K 4.1 09/29/2018   CL 106 09/29/2018   CREATININE 1.48 (H) 09/29/2018   BUN 22 09/29/2018   CO2 23 09/29/2018   TSH 4.651 (H) 03/13/2018   INR 0.9 10/01/2014   HGBA1C 6.2 (H) 03/13/2018   MICROALBUR 33.5 (H) 06/02/2015   BP Readings from Last 3 Encounters:  10/14/18 (!) 150/82  09/29/18 (!) 152/100  08/20/18 140/80   Wt Readings from Last 3 Encounters:  10/14/18 204 lb 12.8 oz (92.9 kg)  08/20/18 205 lb (93 kg)  04/25/18 201 lb 12.8 oz (91.5 kg)   ASSESSMENT AND PLAN:  Discussed the following assessment and plan:    ICD-10-CM   1. New daily persistent headache  G44.52 CT Head Wo Contrast  2. History of recent traumatic injury of head  Z87.820 CT Head Wo Contrast  3. Long-term use of aspirin therapy  Z79.82 CT Head Wo Contrast  4. Essential hypertension  I10 CT Head Wo Contrast  5. Chronic kidney disease, unspecified CKD stage  N18.9   6. AICD (automatic cardioverter/defibrillator) present  Z95.810   7. Complex sleep apnea syndrome  G47.31    Although no neuro findings related  New onset HA persistent  and progressive   Advise ct r/o subdural etc.    Can try tizanidine short term if helps.  No aleve or ibu ( dosent work anyway)  Get bp  down below 160   140 ok .    get back on cpap for her sleep apnea   Counseled.  Consider  This could be post traumatic  Lamonte Sakai? But  Later onset.   Expectant management and discussion of plan and treatment with opportunity to ask questions and all were answered. The patient agreed with the plan and demonstrated an understanding of the instructions.   Advised to call back or seek an in-person evaluation if worsening  or having  further concerns . In the interim .    Shanon Ace, MD

## 2018-10-31 ENCOUNTER — Ambulatory Visit (INDEPENDENT_AMBULATORY_CARE_PROVIDER_SITE_OTHER)
Admission: RE | Admit: 2018-10-31 | Discharge: 2018-10-31 | Disposition: A | Discharge status: Home / Self Care | Payer: Medicare Other | Source: Ambulatory Visit | Attending: Internal Medicine | Admitting: Internal Medicine

## 2018-10-31 ENCOUNTER — Other Ambulatory Visit: Payer: Self-pay

## 2018-10-31 DIAGNOSIS — Z7982 Long term (current) use of aspirin: Secondary | ICD-10-CM

## 2018-10-31 DIAGNOSIS — G4452 New daily persistent headache (NDPH): Secondary | ICD-10-CM

## 2018-10-31 DIAGNOSIS — I10 Essential (primary) hypertension: Secondary | ICD-10-CM

## 2018-10-31 DIAGNOSIS — Z87820 Personal history of traumatic brain injury: Secondary | ICD-10-CM

## 2018-11-11 ENCOUNTER — Encounter: Payer: Medicare Other | Admitting: Family Medicine

## 2018-11-14 ENCOUNTER — Encounter: Payer: Medicare Other | Admitting: Family Medicine

## 2018-11-14 ENCOUNTER — Other Ambulatory Visit: Payer: Self-pay | Admitting: Family Medicine

## 2018-11-21 ENCOUNTER — Other Ambulatory Visit: Payer: Self-pay

## 2018-11-21 ENCOUNTER — Ambulatory Visit: Payer: Medicare Other | Admitting: Family Medicine

## 2018-11-21 ENCOUNTER — Encounter: Payer: Self-pay | Admitting: Family Medicine

## 2018-11-21 VITALS — BP 142/80 | HR 82 | Temp 97.7°F | Wt 209.0 lb

## 2018-11-21 DIAGNOSIS — I10 Essential (primary) hypertension: Secondary | ICD-10-CM

## 2018-11-21 DIAGNOSIS — G4731 Primary central sleep apnea: Secondary | ICD-10-CM

## 2018-11-21 DIAGNOSIS — E1169 Type 2 diabetes mellitus with other specified complication: Secondary | ICD-10-CM

## 2018-11-21 DIAGNOSIS — F17210 Nicotine dependence, cigarettes, uncomplicated: Secondary | ICD-10-CM

## 2018-11-21 DIAGNOSIS — E039 Hypothyroidism, unspecified: Secondary | ICD-10-CM | POA: Diagnosis not present

## 2018-11-21 DIAGNOSIS — E785 Hyperlipidemia, unspecified: Secondary | ICD-10-CM

## 2018-11-21 DIAGNOSIS — I5042 Chronic combined systolic (congestive) and diastolic (congestive) heart failure: Secondary | ICD-10-CM

## 2018-11-21 DIAGNOSIS — I739 Peripheral vascular disease, unspecified: Secondary | ICD-10-CM

## 2018-11-21 NOTE — Progress Notes (Signed)
Subjective:    Patient ID: Paula Stein, female    DOB: April 26, 1945, 73 y.o.   MRN: [id number]  No chief complaint on file.   HPI Patient was seen today for f/u on chronic conditions and TOC, previously seen by Dr. Maudie Mercury.  HTN/NCIM, HLD, systolic and diastolic CHF, PAD: -followed by Cardiology -has ICD in place -not really checking bp -denies LE edema, SOB, CP -taking lipitor, spironolactone, coreg, lasix, imdur, and entresto.  Sleep apnea: -on CPAP -seen in July for HAs.  States improved after wearing CPAP again.  Pt recently moved in with her son and daughter in law.  CPAP was packed up.  Nicotine use: -smoking less since moving in with her son and his family. -may smoke 1 cig/day.  A pack may last 1 month  Hypothyroidism: -taking synthroid 150 mcg daily -denies symptoms  Social hx: Pt living with her son, daughter-in-law, and their daughter.  Pt's son is an ADA in [place].  Pt states she cooks 2 nights per wk, her daughter in law 3 nights per wk, and her son 2 nights.   Past Medical History:  Diagnosis Date  . AICD (automatic cardioverter/defibrillator) present 10/08/2014   SUBQ    /    DR [name]  . CHF (congestive heart failure) ([place])   . Diabetes mellitus without complication ([place])   . History of cardiac cath 05/2007   normal-with patent coronaries  . History of colonoscopy   . History of hiatal hernia   . Hypertension   . Iatrogenic thyroiditis   . Non-ischemic cardiomyopathy ([place])    EF 28%- reassessment of LV function 2011 with LVEf 45-50%  . PAD (peripheral artery disease) (H[facility])    lower extremities with ABIs of 0.5 bilaterally  . Personal history of goiter   . S/P radioactive iodine thyroid ablation   . S/P thyroidectomy   . Shortness of breath dyspnea     Allergies  Allergen Reactions  . Ibuprofen Nausea Only    ROS General: Denies fever, chills, night sweats, changes in weight, changes in appetite HEENT: Denies headaches, ear pain, changes in  vision, rhinorrhea, sore throat CV: Denies CP, palpitations, SOB, orthopnea Pulm: Denies SOB, cough, wheezing GI: Denies abdominal pain, nausea, vomiting, diarrhea, constipation GU: Denies dysuria, hematuria, frequency, vaginal discharge Msk: Denies muscle cramps, joint pains Neuro: Denies weakness, numbness, tingling Skin: Denies rashes, bruising Psych: Denies depression, anxiety, hallucinations     Objective:    Blood pressure (!) 142/80, pulse 82, temperature 97.7 F (36.5 C), temperature source Oral, weight 209 lb (94.8 kg), SpO2 98 %.   Gen. Pleasant, well-nourished, in no distress, normal affect   HEENT: [place]/AT, face symmetric, conjunctiva clear, no scleral icterus, PERRLA, EOMI, nares patent without drainage.  TMs normal b/l.  Hearing aids in place b//. Lungs: no accessory muscle use, CTAB, no wheezes or rales Cardiovascular: RRR, no m/r/g, no peripheral edema Abdomen: BS present, soft, NT/ND Musculoskeletal: No deformities, no cyanosis or clubbing, normal tone Neuro:  A&Ox3, CN II-XII intact, normal gait Skin:  Warm, no lesions/ rash   Wt Readings from Last 3 Encounters:  11/21/18 209 lb (94.8 kg)  10/14/18 204 lb 12.8 oz (92.9 kg)  08/20/18 205 lb (93 kg)    Lab Results  Component Value Date   WBC 11.6 (H) 09/29/2018   HGB 12.3 09/29/2018   HCT 38.8 09/29/2018   PLT 262 09/29/2018   GLUCOSE 142 (H) 09/29/2018   CHOL 234 (H) 04/13/2017   TRIG 122.0 04/13/2017  HDL 67.00 04/13/2017   LDLDIRECT 195.2 02/15/2011   LDLCALC 142 (H) 04/13/2017   ALT 19 09/29/2018   AST 16 09/29/2018   NA 137 09/29/2018   K 4.1 09/29/2018   CL 106 09/29/2018   CREATININE 1.48 (H) 09/29/2018   BUN 22 09/29/2018   CO2 23 09/29/2018   TSH 4.651 (H) 03/13/2018   INR 0.9 10/01/2014   HGBA1C 6.2 (H) 03/13/2018   MICROALBUR 33.5 (H) 06/02/2015    Assessment/Plan:  Complex sleep apnea syndrome -continue CPAP  Essential hypertension -mildly elevated.  Repeat bp -discussed  lifestyle modifications -continue current meds -check bp at home  Hypothyroidism, unspecified type -continue synthroid 150 mcg daily -last TSH 03/13/18 was elevated at 4.651.  Will need to recheck.  Cigarette nicotine dependence without complication -smoking cessation counseling >3 min, <10 min -continue cutting down as pt may smoke 1 cig per day -will continue to monitor  Chronic combined systolic and diastolic CHF (congestive heart failure) ([place]) -continue lifestyle modifications -continue daily wts and sodium restriction -continue current meds: coreg 25 mg BID, imdur 30 mg daily, lasix 40 BID, spironolactone 50 mg daily, ASA 81 mg, and entresto 49-51 mg BID -continue f/u with Cardiology, Dr. [name]  Hyperlipidemia associated with type 2 diabetes mellitus ([place]) -continue Lipitor 40 mg and ASA 81 mg -continue lifestyle modifications -DM diet controlled -last hgb A1C 6.2% on 03/2018 -continue checking fsbs at home  PAD -continue lipitor 40 mg and ASA 81 mg daily -continue f/u with Cardiology  F/u prn  Grier Mitts, MD

## 2018-11-25 ENCOUNTER — Encounter: Payer: Self-pay | Admitting: Family Medicine

## 2018-12-06 ENCOUNTER — Other Ambulatory Visit: Payer: Self-pay | Admitting: Family Medicine

## 2018-12-06 DIAGNOSIS — Z1231 Encounter for screening mammogram for malignant neoplasm of breast: Secondary | ICD-10-CM

## 2018-12-11 ENCOUNTER — Ambulatory Visit (INDEPENDENT_AMBULATORY_CARE_PROVIDER_SITE_OTHER): Payer: Medicare Other | Admitting: *Deleted

## 2018-12-11 DIAGNOSIS — I428 Other cardiomyopathies: Secondary | ICD-10-CM

## 2018-12-11 DIAGNOSIS — I5042 Chronic combined systolic (congestive) and diastolic (congestive) heart failure: Secondary | ICD-10-CM

## 2018-12-11 LAB — CUP PACEART REMOTE DEVICE CHECK
Battery Remaining Percentage: 54 %
Date Time Interrogation Session: 20200909112100
Implantable Lead Implant Date: 20160707
Implantable Lead Location: 753858
Implantable Lead Model: 3401
Implantable Pulse Generator Implant Date: 20160707
Pulse Gen Serial Number: 115852

## 2018-12-24 ENCOUNTER — Encounter: Payer: Self-pay | Admitting: Internal Medicine

## 2018-12-24 ENCOUNTER — Ambulatory Visit (INDEPENDENT_AMBULATORY_CARE_PROVIDER_SITE_OTHER): Payer: Medicare Other | Admitting: Internal Medicine

## 2018-12-24 ENCOUNTER — Other Ambulatory Visit: Payer: Self-pay

## 2018-12-24 ENCOUNTER — Telehealth: Payer: Self-pay | Admitting: *Deleted

## 2018-12-24 VITALS — BP 144/80 | HR 92 | Ht 70.5 in | Wt 211.4 lb

## 2018-12-24 DIAGNOSIS — Z95810 Presence of automatic (implantable) cardiac defibrillator: Secondary | ICD-10-CM

## 2018-12-24 DIAGNOSIS — Z23 Encounter for immunization: Secondary | ICD-10-CM | POA: Diagnosis not present

## 2018-12-24 DIAGNOSIS — E119 Type 2 diabetes mellitus without complications: Secondary | ICD-10-CM

## 2018-12-24 DIAGNOSIS — I5042 Chronic combined systolic (congestive) and diastolic (congestive) heart failure: Secondary | ICD-10-CM | POA: Diagnosis not present

## 2018-12-24 DIAGNOSIS — I428 Other cardiomyopathies: Secondary | ICD-10-CM | POA: Diagnosis not present

## 2018-12-24 MED ORDER — FLUAD QUADRIVALENT 0.5 ML IM PRSY: 0.5000 mL | PREFILLED_SYRINGE | Freq: Once | INTRAMUSCULAR | 0 refills | Status: AC

## 2018-12-24 NOTE — Patient Instructions (Signed)
Medication Instructions:  The current medical regimen is effective;  continue present plan and medications.  If you need a refill on your cardiac medications before your next appointment, please call your pharmacy.   Follow-Up: At [facility], you and your health needs are our priority.  As part of our continuing mission to provide you with exceptional heart care, we have created designated Provider Care Teams.  These Care Teams include your primary Cardiologist (physician) and Advanced Practice Providers (APPs -  Physician Assistants and Nurse Practitioners) who all work together to provide you with the care you need, when you need it. You will need a follow up appointment in 12 months.  Please call our office 2 months in advance to schedule this appointment.  You may see Virl Axe, MD or one of the following Advanced Practice Providers on your designated Care Team:   Chanetta Marshall, NP . Tommye Standard, PA-C  Thank you for choosing [facility]!!

## 2018-12-24 NOTE — Progress Notes (Signed)
Patient Care Team: [name], MD as PCP - General (Family Medicine) [name], MD as PCP - Cardiology (Cardiology) [name], MD as PCP - Electrophysiology (Cardiology)   HPI  Paula Stein is a 73 y.o. 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  The patient denies chest pain,  nocturnal dyspnea, orthopnea or peripheral edema.  There have been no palpitations, lightheadedness or syncope. Has chronic and stable significant DOE, but walking stairs is limited by leg strength more than breath.     DATE TEST EF   4/16 Echo  30-35 % Severe LVH/LAE  2/19 Echo 25-30% Severe LVH  3/20 Echo  30-35%     Date Cr K TSH  12/17 1.02 4.4 6.34  9/18 1.22 4.4 39.32  6/19 0.99 4.3 2.78  6/20  1.48 4.1 (12/19) 4.6         Past Medical History:  Diagnosis Date  . AICD (automatic cardioverter/defibrillator) present 10/08/2014   SUBQ    /    DR [name]  . CHF (congestive heart failure) ([place])   . Diabetes mellitus without complication ([place])   . History of cardiac cath 05/2007   normal-with patent coronaries  . History of colonoscopy   . History of hiatal hernia   . Hypertension   . Iatrogenic thyroiditis   . Non-ischemic cardiomyopathy ([place])    EF 28%- reassessment of LV function 2011 with LVEf 45-50%  . PAD (peripheral artery disease) (H[facility])    lower extremities with ABIs of 0.5 bilaterally  . Personal history of goiter   . S/P radioactive iodine thyroid ablation   . S/P thyroidectomy   . Shortness of breath dyspnea     Past Surgical History:  Procedure Laterality Date  . EP IMPLANTABLE DEVICE N/A 10/08/2014   Procedure: SubQ ICD Implant;  Surgeon: [name], MD;  Location: [place];  Service: Cardiovascular;  Laterality: N/A;  . THYROIDECTOMY    . TONSILLECTOMY    . TOTAL ABDOMINAL HYSTERECTOMY       Current Outpatient Medications  Medication Sig Dispense Refill  . acetaminophen (TYLENOL) 500 MG tablet Take 1 tablet (500 mg total) by mouth every 6 (six) hours as needed. 30 tablet 0  . aspirin 81 MG tablet Take 81 mg by mouth daily.      Marland Kitchen atorvastatin (LIPITOR) 40 MG tablet TAKE 1 AND 1/2 TABLETS(60 MG) BY MOUTH DAILY 135 tablet 0  . carvedilol (COREG) 25 MG tablet TAKE 1 TABLET BY MOUTH TWICE A DAY WITH FOOD 180 tablet 2  . furosemide (LASIX) 40 MG tablet TAKE 1 TABLET BY MOUTH TWICE DAILY 180 tablet 1  . glucose blood (ONETOUCH VERIO) test strip Use as instructed to check blood sugar once a day 100 each 3  . hydroxychloroquine (PLAQUENIL) 200 MG tablet Take 1 tablet by mouth 2 (two) times daily.  3  . isosorbide mononitrate (IMDUR) 30 MG 24 hr tablet Take 1 tablet (30 mg total) by mouth daily. 90 tablet 3  . levothyroxine (SYNTHROID) 150 MCG tablet TAKE 1 TABLET(150 MCG) BY MOUTH DAILY 90 tablet 0  . ONETOUCH DELICA LANCETS 86V MISC 1 Device by Other route daily. 100 each 3  . predniSONE (DELTASONE) 10 MG tablet Take 1 tablet by mouth daily.  3  . sacubitril-valsartan (ENTRESTO) 49-51 MG Take 1 tablet by mouth  2 (two) times daily. 60 tablet 10  . spironolactone (ALDACTONE) 50 MG tablet Take 1 tablet (50 mg total) by mouth daily. 90 tablet 3  . tiZANidine (ZANAFLEX) 2 MG tablet Take 1 tablet (2 mg total) by mouth every 8 (eight) hours as needed for muscle spasms. 20 tablet 0   Current Facility-Administered Medications  Medication Dose Route Frequency Provider Last Rate Last Dose  . triamcinolone acetonide (KENALOG) 10 MG/ML injection 10 mg  10 mg Other Once Landis Martins, DPM        Allergies  Allergen Reactions  . Ibuprofen Nausea Only      Review of Systems negative except from HPI and PMH  Physical Exam BP (!) 144/80   Pulse 92   Ht 5' 10.5" (1.791 m)   Wt 211 lb 6.4 oz (95.9 kg)   SpO2 98%   BMI 29.90 kg/m  Well developed and nourished in no acute distress  HENT normal Neck supple with JVP-  flat   Clear Regular rate and rhythm, no murmurs or gallops Abd-soft with active BS No Clubbing cyanosis edema Skin-warm and dry A & Oriented  Grossly normal sensory and motor function  ECG sinus at 92 Intervals 19/10/35 ST-T changes 2, 3, F, V4-V6  These have been gradually worsening over the last couple of years, that is to say more extensive involvement and more specific T wave      Assessment and  Plan   Nonischemic cardiomyopathy  CHF  Chronic systolic  ICD-Subcutaneous The patient's device was interrogated.  The information was reviewed. No changes were made in the programming.     Keloid  Hypertension  Hypothyroid  High Risk Medication Surveillance  ECG changes     Surveillance labs are normal  TSH within range  Sleep disturbance problem  Suggested diphenhydramine and melatonin  Euvolemic continue current meds  BP borderline elevated  Will follow   Will arrange followup with Dr Edd Fabian

## 2018-12-24 NOTE — Telephone Encounter (Signed)
12/24/2018 called and left message for patient to call back about the [employer] research study.

## 2018-12-26 NOTE — Progress Notes (Signed)
Remote ICD transmission.   

## 2019-01-10 ENCOUNTER — Encounter: Payer: Self-pay | Admitting: Family Medicine

## 2019-01-14 ENCOUNTER — Encounter: Payer: Self-pay | Admitting: Family Medicine

## 2019-01-14 ENCOUNTER — Telehealth: Payer: Self-pay | Admitting: *Deleted

## 2019-01-14 NOTE — Telephone Encounter (Signed)
[facility] faxed a refill request for Levothyroxine 128mcg-take 1 tablet by mouth daily-#90.  Message sent to Dr Volanda Napoleon' assistant.

## 2019-01-15 ENCOUNTER — Other Ambulatory Visit: Payer: Self-pay

## 2019-01-15 MED ORDER — LEVOTHYROXINE SODIUM 150 MCG PO TABS: ORAL_TABLET | ORAL | 0 refills | Status: DC

## 2019-01-15 MED ORDER — ONETOUCH VERIO VI STRP: ORAL_STRIP | 1 refills | Status: DC

## 2019-01-15 MED ORDER — ONETOUCH DELICA LANCETS 33G MISC: 1.0000 | Freq: Every day | 1 refills | Status: DC

## 2019-01-15 NOTE — Telephone Encounter (Signed)
Rx was sent to pt pharmacy

## 2019-01-23 ENCOUNTER — Other Ambulatory Visit: Payer: Self-pay

## 2019-01-23 ENCOUNTER — Ambulatory Visit
Admission: RE | Admit: 2019-01-23 | Discharge: 2019-01-23 | Disposition: A | Discharge status: Home / Self Care | Payer: Medicare Other | Source: Ambulatory Visit | Attending: Family Medicine | Admitting: Family Medicine

## 2019-01-23 DIAGNOSIS — Z1231 Encounter for screening mammogram for malignant neoplasm of breast: Secondary | ICD-10-CM

## 2019-01-24 LAB — HM MAMMOGRAPHY

## 2019-02-04 ENCOUNTER — Encounter: Payer: Self-pay | Admitting: Family Medicine

## 2019-02-05 NOTE — Progress Notes (Signed)
Cardiology Office Note:    Date:  02/06/2019   ID:  Paula Stein, DOB 1945/09/04, MRN [id number]  PCP:  Paula Ruddy, MD  Cardiologist:  Paula Mocha, MD  Electrophysiologist: Paula Axe, MD  Referring MD: Paula Ruddy, MD   Chief Complaint  Patient presents with  . Follow-up  . Congestive Heart Failure    History of Present Illness:    Paula Stein is a 73 y.o. female with a past medical history significant for heart failure with reduced ejection fraction secondary to non-ischemic cardiomyopathy, s/p ICD 2016, hypertension, diabetes, hyperthyroidism s/p RAI ablation, CKD stage 3.  The patient was last seen in the office on 12/24/2018 by Dr. Caryl Stein at which time she was stable.  She was having continued chronic and stable dyspnea on exertion.  It was noted that walking up stairs was limited more by leg strength than breath.  Paula Stein is here today for follow up. She is concerned about wt gain. She says that she has gained about 20 pounds since May. Feels like it is all in her abdomen. She moved in with her son in law and their cooking is not heart healthy.  She has also been eating a lot of Halloween candy.  She is on a prednisone taper for gout. She has no LE edema. Her breathing is at baseline with mild DOE when walking up stairs. She has no orthopnea or PND. In the past she has had orthopnea with volume overload so she pays attention.   Home wts have been within 2 pounds. She has monitored wts at home thru [facility].   She reports that she has been drinking about 1/2 gallon of water per day, has recently decreased water intake.   She uses CPAP every night.   Home BPs 140's/70's.  She has not taken her blood pressure medicines yet this morning.  I also discovered that she has been taking her twice daily medicines altogether at one time including Entresto and carvedilol.  We discussed proper dosing intervals.   Cardiac studies   DATE TEST EF   4/16 Echo   30-35 % Severe LVH/LAE  2/19 Echo 25-30% Severe LVH  3/20 Echo  30-35%      Past Medical History:  Diagnosis Date  . AICD (automatic cardioverter/defibrillator) present 10/08/2014   SUBQ    /    DR Paula Stein  . CHF (congestive heart failure) ([place])   . Diabetes mellitus without complication ([place])   . History of cardiac cath 05/2007   normal-with patent coronaries  . History of colonoscopy   . History of hiatal hernia   . Hypertension   . Iatrogenic thyroiditis   . Non-ischemic cardiomyopathy ([place])    EF 28%- reassessment of LV function 2011 with LVEf 45-50%  . PAD (peripheral artery disease) ([facility])    lower extremities with ABIs of 0.5 bilaterally  . Personal history of goiter   . S/P radioactive iodine thyroid ablation   . S/P thyroidectomy   . Shortness of breath dyspnea     Past Surgical History:  Procedure Laterality Date  . EP IMPLANTABLE DEVICE N/A 10/08/2014   Procedure: SubQ ICD Implant;  Surgeon: [name], MD;  Location: [place] [facility];  Service: Cardiovascular;  Laterality: N/A;  . THYROIDECTOMY    . TONSILLECTOMY    . TOTAL ABDOMINAL HYSTERECTOMY      Current Medications: Current Meds  Medication Sig  . acetaminophen (TYLENOL) 500 MG tablet Take  1 tablet (500 mg total) by mouth every 6 (six) hours as needed.  Marland Kitchen aspirin 81 MG tablet Take 81 mg by mouth daily.    Marland Kitchen atorvastatin (LIPITOR) 40 MG tablet TAKE 1 AND 1/2 TABLETS(60 MG) BY MOUTH DAILY  . carvedilol (COREG) 25 MG tablet TAKE 1 TABLET BY MOUTH TWICE A DAY WITH FOOD  . furosemide (LASIX) 40 MG tablet TAKE 1 TABLET BY MOUTH TWICE DAILY  . glucose blood (ONETOUCH VERIO) test strip Use as instructed to check blood sugar once a day  . hydroxychloroquine (PLAQUENIL) 200 MG tablet Take 1 tablet by mouth 2 (two) times daily.  . isosorbide mononitrate (IMDUR) 30 MG 24 hr tablet Take 1 tablet (30 mg total) by mouth daily.  Marland Kitchen levothyroxine (SYNTHROID) 150 MCG tablet TAKE 1 TABLET(150 MCG) BY MOUTH DAILY  .  OneTouch Delica Lancets 22W MISC 1 Device by Other route daily.  . predniSONE (DELTASONE) 10 MG tablet Take 1 tablet by mouth daily.  . sacubitril-valsartan (ENTRESTO) 49-51 MG Take 1 tablet by mouth 2 (two) times daily.  Marland Kitchen spironolactone (ALDACTONE) 50 MG tablet Take 1 tablet (50 mg total) by mouth daily.   Current Facility-Administered Medications for the 02/06/19 encounter (Office Visit) with Paula Perch, NP  Medication  . triamcinolone acetonide (KENALOG) 10 MG/ML injection 10 mg     Allergies:   Ibuprofen   Social History   Socioeconomic History  . Marital status: Divorced    Spouse name: Not on file  . Number of children: 1  . Years of education: Not on file  . Highest education level: Not on file  Occupational History  . Occupation: Retired    Fish farm manager: RETIRED    CommentAdvertising copywriter  Social Needs  . Financial resource strain: Not on file  . Food insecurity    Worry: Not on file    Inability: Not on file  . Transportation needs    Medical: Not on file    Non-medical: Not on file  Tobacco Use  . Smoking status: Former Smoker    Packs/day: 0.25    Years: 40.00    Pack years: 10.00    Types: Cigarettes    Quit date: 07/03/2018    Years since quitting: 0.5  . Smokeless tobacco: Never Used  . Tobacco comment: up to 1 a day  Substance and Sexual Activity  . Alcohol use: Yes    Alcohol/week: 0.0 standard drinks    Comment: rare glass of wine  . Drug use: No  . Sexual activity: Not Currently  Lifestyle  . Physical activity    Days per week: Not on file    Minutes per session: Not on file  . Stress: Not on file  Relationships  . Social Herbalist on phone: Not on file    Gets together: Not on file    Attends religious service: Not on file    Active member of club or organization: Not on file    Attends meetings of clubs or organizations: Not on file    Relationship status: Not on file  Other Topics Concern  . Not on file  Social History  Narrative   Retired Education officer, museum   Divorced - one grown son   current smoker    Alcohol use-no      Drug use-no    Regular exercise- no     son - Jaima Janney     Family History: The patient's family history includes Diabetes in her  mother; Diabetes type II in her mother; Heart disease in her mother; Hyperlipidemia in her father; Hypertension in her mother; Other in her father. There is no history of Breast cancer. ROS:   Please see the history of present illness.     All other systems reviewed and are negative.   EKG:  EKG is not ordered today.    Recent Labs: 03/13/2018: TSH 4.651 03/14/2018: B Natriuretic Peptide 587.2 09/29/2018: ALT 19; BUN 22; Creatinine, Ser 1.48; Hemoglobin 12.3; Platelets 262; Potassium 4.1; Sodium 137   Recent Lipid Panel    Component Value Date/Time   CHOL 234 (H) 04/13/2017 0918   TRIG 122.0 04/13/2017 0918   HDL 67.00 04/13/2017 0918   CHOLHDL 3 04/13/2017 0918   VLDL 24.4 04/13/2017 0918   LDLCALC 142 (H) 04/13/2017 0918   LDLDIRECT 195.2 02/15/2011 1625    Physical Exam:    VS:  BP (!) 144/70   Pulse 74   Ht 5' 10.5" (1.791 m)   Wt 221 lb (100.2 kg)   SpO2 97%   BMI 31.26 kg/m     Wt Readings from Last 6 Encounters:  02/06/19 221 lb (100.2 kg)  12/24/18 211 lb 6.4 oz (95.9 kg)  11/21/18 209 lb (94.8 kg)  10/14/18 204 lb 12.8 oz (92.9 kg)  08/20/18 205 lb (93 kg)  04/25/18 201 lb 12.8 oz (91.5 kg)     Physical Exam  Constitutional: She is oriented to person, place, and time. She appears well-developed and well-nourished. No distress.  HENT:  Head: Normocephalic and atraumatic.  Neck: Normal range of motion. Neck supple. No JVD present.  Cardiovascular: Normal rate, regular rhythm, normal heart sounds and intact distal pulses. Exam reveals no gallop and no friction rub.  No murmur heard. Pulmonary/Chest: Effort normal and breath sounds normal. No respiratory distress. She has no wheezes. She has no rales.  Abdominal: Soft.  Bowel sounds are normal. She exhibits no distension.  Musculoskeletal: Normal range of motion.        General: No edema.  Neurological: She is alert and oriented to person, place, and time.  Skin: Skin is warm and dry.  Psychiatric: She has a normal mood and affect. Her behavior is normal. Judgment and thought content normal.  Vitals reviewed.   ASSESSMENT:    1. Chronic combined systolic and diastolic CHF (congestive heart failure) ([place])   2. Essential (primary) hypertension   3. ICD (implantable cardioverter-defibrillator) in place   4. Hyperlipidemia, unspecified hyperlipidemia type   5. Stage 3b chronic kidney disease   6. Type 2 diabetes mellitus with diabetic polyneuropathy, without long-term current use of insulin ([facility])    PLAN:    In order of problems listed above:  Combined systolic and diastolic heart failure -EF 30-35% by echo in 06/2018. -Medical therapy includes beta-blocker, Entresto, spironolactone, Imdur and Lasix -Volume status stable.  Her weight is increased here at the office but has been within 2 pounds at home.  Patient attributes weight gain to recent poor diet and Halloween candy.  She is currently feeling very well and at her baseline.  Essential hypertension -Blood pressure was mildly elevated.  Patient has not taken her medications yet this morning.  Also noted that she was taking her twice daily medications all at once including Entresto and carvedilol.  We reviewed proper dosing intervals and she says that she will correct this.  ICD in place -Followed by EP, Dr. Caryl Stein.   Hyperlipidemia -Mild plaque noted on cardiac catheterization in 2009. -  Atorvastatin 40 mg daily.  LDL was 142 in 04/2017 -We will update lipid panel.   CKD stage III -Over the last year indicate serum creatinine about 1.4-1.5.  Appears to be stable.  Diabetes type 2 -Patient has a history of intolerance to Metformin.  Management per primary care.  A1c has been in the 6-7 range.    Medication Adjustments/Labs and Tests Ordered: Current medicines are reviewed at length with the patient today.  Concerns regarding medicines are outlined above. Labs and tests ordered and medication changes are outlined in the patient instructions below:  Patient Instructions  Medication Instructions:  Make sure you are taking your medications as prescribed. Medications that are twice a day should be taking aproximately  12 hours apart   *If you need a refill on your cardiac medications before your next appointment, please call your pharmacy*  Lab Work: TODAY: Lipids   If you have labs (blood work) drawn today and your tests are completely normal, you will receive your results only by: Marland Kitchen MyChart Message (if you have MyChart) OR . A paper copy in the mail If you have any lab test that is abnormal or we need to change your treatment, we will call you to review the results.  Testing/Procedures: None   Follow-Up: At [facility], you and your health needs are our priority.  As part of our continuing mission to provide you with exceptional heart care, we have created designated Provider Care Teams.  These Care Teams include your primary Cardiologist (physician) and Advanced Practice Providers (APPs -  Physician Assistants and Nurse Practitioners) who all work together to provide you with the care you need, when you need it.  Your next appointment:   6 months  The format for your next appointment:   In Person  Provider:   You may see Paula Mocha, MD or one of the following Advanced Practice Providers on your designated Care Team:    Richardson Dopp, PA-C  [place], [place]  Paula Perch, NP   Other Instructions  Do the following things EVERY DAY:   1. Weigh yourself EVERY morning after you go to the bathroom but before you eat or drink anything. Write this number down in a weight log/diary. If you gain 3 pounds overnight or 5 pounds in a week, call the office.   2. Take  your medicines as prescribed. If you have concerns about your medications, please call us before you stop taking them.    3. Eat low salt foods-Limit salt (sodium) to 2000 mg per day. This will help prevent your body from holding onto fluid. Read food labels as many processed foods have a lot of sodium, especially canned goods and prepackaged meats. If you would like some assistance choosing low sodium foods, we would be happy to set you up with a nutritionist.   4. Stay as active as you can everyday. Staying active will give you more energy and make your muscles stronger. Start with 5 minutes at a time and work your way up to 30 minutes a day. Break up your activities--do some in the morning and some in the afternoon. Start with 3 days per week and work your way up to 5 days as you can.  If you have chest pain, feel short of breath, dizzy, or lightheaded, STOP. If you don't feel better after a short rest, call 911. If you do feel better, call the office to let us know you have symptoms with exercise.  5. Limit all fluids for the day to less than 2 liters. Fluid includes all drinks, coffee, juice, ice chips, soup, jello, and all other liquids.    DASH Eating Plan DASH stands for "Dietary Approaches to Stop Hypertension." The DASH eating plan is a healthy eating plan that has been shown to reduce high blood pressure (hypertension). It may also reduce your risk for type 2 diabetes, heart disease, and stroke. The DASH eating plan may also help with weight loss. What are tips for following this plan?  General guidelines  Avoid eating more than 2,300 mg (milligrams) of salt (sodium) a day. If you have hypertension, you may need to reduce your sodium intake to 1,500 mg a day.  Limit alcohol intake to no more than 1 drink a day for nonpregnant women and 2 drinks a day for men. One drink equals 12 oz of beer, 5 oz of wine, or 1 oz of hard liquor.  Work with your health care provider to maintain a  healthy body weight or to lose weight. Ask what an ideal weight is for you.  Get at least 30 minutes of exercise that causes your heart to beat faster (aerobic exercise) most days of the week. Activities may include walking, swimming, or biking.  Work with your health care provider or diet and nutrition specialist (dietitian) to adjust your eating plan to your individual calorie needs. Reading food labels   Check food labels for the amount of sodium per serving. Choose foods with less than 5 percent of the Daily Value of sodium. Generally, foods with less than 300 mg of sodium per serving fit into this eating plan.  To find whole grains, look for the word "whole" as the first word in the ingredient list. Shopping  Buy products labeled as "low-sodium" or "no salt added."  Buy fresh foods. Avoid canned foods and premade or frozen meals. Cooking  Avoid adding salt when cooking. Use salt-free seasonings or herbs instead of table salt or sea salt. Check with your health care provider or pharmacist before using salt substitutes.  Do not fry foods. Cook foods using healthy methods such as baking, boiling, grilling, and broiling instead.  Cook with heart-healthy oils, such as olive, canola, soybean, or sunflower oil. Meal planning  Eat a balanced diet that includes: ? 5 or more servings of fruits and vegetables each day. At each meal, try to fill half of your plate with fruits and vegetables. ? Up to 6-8 servings of whole grains each day. ? Less than 6 oz of lean meat, poultry, or fish each day. A 3-oz serving of meat is about the same size as a deck of cards. One egg equals 1 oz. ? 2 servings of low-fat dairy each day. ? A serving of nuts, seeds, or beans 5 times each week. ? Heart-healthy fats. Healthy fats called Omega-3 fatty acids are found in foods such as flaxseeds and coldwater fish, like sardines, salmon, and mackerel.  Limit how much you eat of the following: ? Canned or  prepackaged foods. ? Food that is high in trans fat, such as fried foods. ? Food that is high in saturated fat, such as fatty meat. ? Sweets, desserts, sugary drinks, and other foods with added sugar. ? Stein-fat dairy products.  Do not salt foods before eating.  Try to eat at least 2 vegetarian meals each week.  Eat more home-cooked food and less restaurant, buffet, and fast food.  When eating at a restaurant, ask that  your food be prepared with less salt or no salt, if possible. What foods are recommended? The items listed may not be a complete list. Talk with your dietitian about what dietary choices are best for you. Grains Whole-grain or whole-wheat bread. Whole-grain or whole-wheat pasta. Brown rice. [place]. Bulgur. Whole-grain and low-sodium cereals. Pita bread. Low-fat, low-sodium crackers. Whole-wheat flour tortillas. Vegetables Fresh or frozen vegetables (raw, steamed, roasted, or grilled). Low-sodium or reduced-sodium tomato and vegetable juice. Low-sodium or reduced-sodium tomato sauce and tomato paste. Low-sodium or reduced-sodium canned vegetables. Fruits All fresh, dried, or frozen fruit. Canned fruit in natural juice (without added sugar). Meat and other protein foods Skinless chicken or [place]. Ground chicken or [place]. Pork with fat trimmed off. Fish and seafood. Egg whites. Dried beans, peas, or lentils. Unsalted nuts, nut butters, and seeds. Unsalted canned beans. Lean cuts of beef with fat trimmed off. Low-sodium, lean deli meat. Dairy Low-fat (1%) or fat-free (skim) milk. Fat-free, low-fat, or reduced-fat cheeses. Nonfat, low-sodium ricotta or cottage cheese. Low-fat or nonfat yogurt. Low-fat, low-sodium cheese. Fats and oils Soft margarine without trans fats. Vegetable oil. Low-fat, reduced-fat, or light mayonnaise and salad dressings (reduced-sodium). Canola, safflower, olive, soybean, and sunflower oils. Avocado. Seasoning and other foods Herbs. Spices.  Seasoning mixes without salt. Unsalted popcorn and pretzels. Fat-free sweets. What foods are not recommended? The items listed may not be a complete list. Talk with your dietitian about what dietary choices are best for you. Grains Baked goods made with fat, such as croissants, muffins, or some breads. Dry pasta or rice meal packs. Vegetables Creamed or fried vegetables. Vegetables in a cheese sauce. Regular canned vegetables (not low-sodium or reduced-sodium). Regular canned tomato sauce and paste (not low-sodium or reduced-sodium). Regular tomato and vegetable juice (not low-sodium or reduced-sodium). Angie Fava. Olives. Fruits Canned fruit in a light or heavy syrup. Fried fruit. Fruit in cream or butter sauce. Meat and other protein foods Fatty cuts of meat. Ribs. Fried meat. Berniece Salines. Sausage. Bologna and other processed lunch meats. Salami. Fatback. Hotdogs. Bratwurst. Salted nuts and seeds. Canned beans with added salt. Canned or smoked fish. Whole eggs or egg yolks. Chicken or [place] with skin. Dairy Whole or 2% milk, cream, and half-and-half. Whole or Stein-fat cream cheese. Whole-fat or sweetened yogurt. Stein-fat cheese. Nondairy creamers. Whipped toppings. Processed cheese and cheese spreads. Fats and oils Butter. Stick margarine. Lard. Shortening. Ghee. Bacon fat. Tropical oils, such as coconut, palm kernel, or palm oil. Seasoning and other foods Salted popcorn and pretzels. Onion salt, garlic salt, seasoned salt, table salt, and sea salt. Worcestershire sauce. Tartar sauce. Barbecue sauce. Teriyaki sauce. Soy sauce, including reduced-sodium. Steak sauce. Canned and packaged gravies. Fish sauce. Oyster sauce. Cocktail sauce. Horseradish that you find on the shelf. Ketchup. Mustard. Meat flavorings and tenderizers. Bouillon cubes. Hot sauce and Tabasco sauce. Premade or packaged marinades. Premade or packaged taco seasonings. Relishes. Regular salad dressings. Where to find more information:   National Heart, Lung, and [place]: https://wilson-eaton.com/  American Heart Association: www.heart.org Summary  The DASH eating plan is a healthy eating plan that has been shown to reduce high blood pressure (hypertension). It may also reduce your risk for type 2 diabetes, heart disease, and stroke.  With the DASH eating plan, you should limit salt (sodium) intake to 2,300 mg a day. If you have hypertension, you may need to reduce your sodium intake to 1,500 mg a day.  When on the DASH eating plan, aim to eat more fresh fruits and  vegetables, whole grains, lean proteins, low-fat dairy, and heart-healthy fats.  Work with your health care provider or diet and nutrition specialist (dietitian) to adjust your eating plan to your individual calorie needs. This information is not intended to replace advice given to you by your health care provider. Make sure you discuss any questions you have with your health care provider. Document Released: 03/09/2011 Document Revised: 03/02/2017 Document Reviewed: 03/13/2016 Elsevier Patient Education  2020 [place], Paula Perch, NP  02/06/2019 11:51 AM    [place]

## 2019-02-06 ENCOUNTER — Other Ambulatory Visit: Payer: Self-pay

## 2019-02-06 ENCOUNTER — Encounter: Payer: Self-pay | Admitting: Cardiology

## 2019-02-06 ENCOUNTER — Ambulatory Visit: Payer: Medicare Other | Admitting: Cardiology

## 2019-02-06 VITALS — BP 144/70 | HR 74 | Ht 70.5 in | Wt 221.0 lb

## 2019-02-06 DIAGNOSIS — I5042 Chronic combined systolic (congestive) and diastolic (congestive) heart failure: Secondary | ICD-10-CM | POA: Diagnosis not present

## 2019-02-06 DIAGNOSIS — I10 Essential (primary) hypertension: Secondary | ICD-10-CM

## 2019-02-06 DIAGNOSIS — Z95810 Presence of automatic (implantable) cardiac defibrillator: Secondary | ICD-10-CM | POA: Diagnosis not present

## 2019-02-06 DIAGNOSIS — E785 Hyperlipidemia, unspecified: Secondary | ICD-10-CM

## 2019-02-06 DIAGNOSIS — E1142 Type 2 diabetes mellitus with diabetic polyneuropathy: Secondary | ICD-10-CM

## 2019-02-06 DIAGNOSIS — N1832 Chronic kidney disease, stage 3b: Secondary | ICD-10-CM

## 2019-02-06 NOTE — Patient Instructions (Addendum)
Medication Instructions:  Make sure you are taking your medications as prescribed. Medications that are twice a day should be taking aproximately  12 hours apart   *If you need a refill on your cardiac medications before your next appointment, please call your pharmacy*  Lab Work: TODAY: Lipids   If you have labs (blood work) drawn today and your tests are completely normal, you will receive your results only by: Marland Kitchen MyChart Message (if you have MyChart) OR . A paper copy in the mail If you have any lab test that is abnormal or we need to change your treatment, we will call you to review the results.  Testing/Procedures: None   Follow-Up: At [facility], you and your health needs are our priority.  As part of our continuing mission to provide you with exceptional heart care, we have created designated Provider Care Teams.  These Care Teams include your primary Cardiologist (physician) and Advanced Practice Providers (APPs -  Physician Assistants and Nurse Practitioners) who all work together to provide you with the care you need, when you need it.  Your next appointment:   6 months  The format for your next appointment:   In Person  Provider:   You may see [name], MD or one of the following Advanced Practice Providers on your designated Care Team:    Richardson Dopp, PA-C  [place], [place]  Daune Perch, NP   Other Instructions  Do the following things EVERY DAY:   1. Weigh yourself EVERY morning after you go to the bathroom but before you eat or drink anything. Write this number down in a weight log/diary. If you gain 3 pounds overnight or 5 pounds in a week, call the office.   2. Take your medicines as prescribed. If you have concerns about your medications, please call us before you stop taking them.    3. Eat low salt foods-Limit salt (sodium) to 2000 mg per day. This will help prevent your body from holding onto fluid. Read food labels as many processed foods  have a lot of sodium, especially canned goods and prepackaged meats. If you would like some assistance choosing low sodium foods, we would be happy to set you up with a nutritionist.   4. Stay as active as you can everyday. Staying active will give you more energy and make your muscles stronger. Start with 5 minutes at a time and work your way up to 30 minutes a day. Break up your activities--do some in the morning and some in the afternoon. Start with 3 days per week and work your way up to 5 days as you can.  If you have chest pain, feel short of breath, dizzy, or lightheaded, STOP. If you don't feel better after a short rest, call 911. If you do feel better, call the office to let us know you have symptoms with exercise.   5. Limit all fluids for the day to less than 2 liters. Fluid includes all drinks, coffee, juice, ice chips, soup, jello, and all other liquids.    DASH Eating Plan DASH stands for "Dietary Approaches to Stop Hypertension." The DASH eating plan is a healthy eating plan that has been shown to reduce high blood pressure (hypertension). It may also reduce your risk for type 2 diabetes, heart disease, and stroke. The DASH eating plan may also help with weight loss. What are tips for following this plan?  General guidelines  Avoid eating more than 2,300 mg (milligrams) of  salt (sodium) a day. If you have hypertension, you may need to reduce your sodium intake to 1,500 mg a day.  Limit alcohol intake to no more than 1 drink a day for nonpregnant women and 2 drinks a day for men. One drink equals 12 oz of beer, 5 oz of wine, or 1 oz of hard liquor.  Work with your health care provider to maintain a healthy body weight or to lose weight. Ask what an ideal weight is for you.  Get at least 30 minutes of exercise that causes your heart to beat faster (aerobic exercise) most days of the week. Activities may include walking, swimming, or biking.  Work with your health care provider or  diet and nutrition specialist (dietitian) to adjust your eating plan to your individual calorie needs. Reading food labels   Check food labels for the amount of sodium per serving. Choose foods with less than 5 percent of the Daily Value of sodium. Generally, foods with less than 300 mg of sodium per serving fit into this eating plan.  To find whole grains, look for the word "whole" as the first word in the ingredient list. Shopping  Buy products labeled as "low-sodium" or "no salt added."  Buy fresh foods. Avoid canned foods and premade or frozen meals. Cooking  Avoid adding salt when cooking. Use salt-free seasonings or herbs instead of table salt or sea salt. Check with your health care provider or pharmacist before using salt substitutes.  Do not fry foods. Cook foods using healthy methods such as baking, boiling, grilling, and broiling instead.  Cook with heart-healthy oils, such as olive, canola, soybean, or sunflower oil. Meal planning  Eat a balanced diet that includes: ? 5 or more servings of fruits and vegetables each day. At each meal, try to fill half of your plate with fruits and vegetables. ? Up to 6-8 servings of whole grains each day. ? Less than 6 oz of lean meat, poultry, or fish each day. A 3-oz serving of meat is about the same size as a deck of cards. One egg equals 1 oz. ? 2 servings of low-fat dairy each day. ? A serving of nuts, seeds, or beans 5 times each week. ? Heart-healthy fats. Healthy fats called Omega-3 fatty acids are found in foods such as flaxseeds and coldwater fish, like sardines, salmon, and mackerel.  Limit how much you eat of the following: ? Canned or prepackaged foods. ? Food that is high in trans fat, such as fried foods. ? Food that is high in saturated fat, such as fatty meat. ? Sweets, desserts, sugary drinks, and other foods with added sugar. ? Full-fat dairy products.  Do not salt foods before eating.  Try to eat at least 2  vegetarian meals each week.  Eat more home-cooked food and less restaurant, buffet, and fast food.  When eating at a restaurant, ask that your food be prepared with less salt or no salt, if possible. What foods are recommended? The items listed may not be a complete list. Talk with your dietitian about what dietary choices are best for you. Grains Whole-grain or whole-wheat bread. Whole-grain or whole-wheat pasta. Brown rice. [place]row. Bulgur. Whole-grain and low-sodium cereals. Pita bread. Low-fat, low-sodium crackers. Whole-wheat flour tortillas. Vegetables Fresh or frozen vegetables (raw, steamed, roasted, or grilled). Low-sodium or reduced-sodium tomato and vegetable juice. Low-sodium or reduced-sodium tomato sauce and tomato paste. Low-sodium or reduced-sodium canned vegetables. Fruits All fresh, dried, or frozen fruit. Canned fruit  in natural juice (without added sugar). Meat and other protein foods Skinless chicken or [place]. Ground chicken or [place]. Pork with fat trimmed off. Fish and seafood. Egg whites. Dried beans, peas, or lentils. Unsalted nuts, nut butters, and seeds. Unsalted canned beans. Lean cuts of beef with fat trimmed off. Low-sodium, lean deli meat. Dairy Low-fat (1%) or fat-free (skim) milk. Fat-free, low-fat, or reduced-fat cheeses. Nonfat, low-sodium ricotta or cottage cheese. Low-fat or nonfat yogurt. Low-fat, low-sodium cheese. Fats and oils Soft margarine without trans fats. Vegetable oil. Low-fat, reduced-fat, or light mayonnaise and salad dressings (reduced-sodium). Canola, safflower, olive, soybean, and sunflower oils. Avocado. Seasoning and other foods Herbs. Spices. Seasoning mixes without salt. Unsalted popcorn and pretzels. Fat-free sweets. What foods are not recommended? The items listed may not be a complete list. Talk with your dietitian about what dietary choices are best for you. Grains Baked goods made with fat, such as croissants, muffins, or  some breads. Dry pasta or rice meal packs. Vegetables Creamed or fried vegetables. Vegetables in a cheese sauce. Regular canned vegetables (not low-sodium or reduced-sodium). Regular canned tomato sauce and paste (not low-sodium or reduced-sodium). Regular tomato and vegetable juice (not low-sodium or reduced-sodium). Angie Fava. Olives. Fruits Canned fruit in a light or heavy syrup. Fried fruit. Fruit in cream or butter sauce. Meat and other protein foods Fatty cuts of meat. Ribs. Fried meat. Berniece Salines. Sausage. Bologna and other processed lunch meats. Salami. Fatback. Hotdogs. Bratwurst. Salted nuts and seeds. Canned beans with added salt. Canned or smoked fish. Whole eggs or egg yolks. Chicken or [place] with skin. Dairy Whole or 2% milk, cream, and half-and-half. Whole or full-fat cream cheese. Whole-fat or sweetened yogurt. Full-fat cheese. Nondairy creamers. Whipped toppings. Processed cheese and cheese spreads. Fats and oils Butter. Stick margarine. Lard. Shortening. Ghee. Bacon fat. Tropical oils, such as coconut, palm kernel, or palm oil. Seasoning and other foods Salted popcorn and pretzels. Onion salt, garlic salt, seasoned salt, table salt, and sea salt. Worcestershire sauce. Tartar sauce. Barbecue sauce. Teriyaki sauce. Soy sauce, including reduced-sodium. Steak sauce. Canned and packaged gravies. Fish sauce. Oyster sauce. Cocktail sauce. Horseradish that you find on the shelf. Ketchup. Mustard. Meat flavorings and tenderizers. Bouillon cubes. Hot sauce and Tabasco sauce. Premade or packaged marinades. Premade or packaged taco seasonings. Relishes. Regular salad dressings. Where to find more information:  National Heart, Lung, and [place]: https://wilson-eaton.com/  American Heart Association: www.heart.org Summary  The DASH eating plan is a healthy eating plan that has been shown to reduce high blood pressure (hypertension). It may also reduce your risk for type 2 diabetes, heart disease,  and stroke.  With the DASH eating plan, you should limit salt (sodium) intake to 2,300 mg a day. If you have hypertension, you may need to reduce your sodium intake to 1,500 mg a day.  When on the DASH eating plan, aim to eat more fresh fruits and vegetables, whole grains, lean proteins, low-fat dairy, and heart-healthy fats.  Work with your health care provider or diet and nutrition specialist (dietitian) to adjust your eating plan to your individual calorie needs. This information is not intended to replace advice given to you by your health care provider. Make sure you discuss any questions you have with your health care provider. Document Released: 03/09/2011 Document Revised: 03/02/2017 Document Reviewed: 03/13/2016 Elsevier Patient Education  2020 [employer].

## 2019-02-07 LAB — LIPID PANEL
Chol/HDL Ratio: 2.5 ratio (ref 0.0–4.4)
Cholesterol, Total: 228 mg/dL — ABNORMAL HIGH (ref 100–199)
HDL: 91 mg/dL (ref >39)
LDL Chol Calc (NIH): 109 mg/dL — ABNORMAL HIGH (ref 0–99)
Triglycerides: 165 mg/dL — ABNORMAL HIGH (ref 0–149)
VLDL Cholesterol Cal: 28 mg/dL (ref 5–40)

## 2019-02-10 ENCOUNTER — Encounter: Payer: Self-pay | Admitting: Family Medicine

## 2019-02-21 ENCOUNTER — Telehealth: Payer: Self-pay

## 2019-02-21 ENCOUNTER — Other Ambulatory Visit: Payer: Self-pay

## 2019-02-21 DIAGNOSIS — E1142 Type 2 diabetes mellitus with diabetic polyneuropathy: Secondary | ICD-10-CM

## 2019-02-21 NOTE — Telephone Encounter (Signed)
Form received completed and faxed back to [facility]

## 2019-02-21 NOTE — Telephone Encounter (Signed)
Copied from [place] [phone]. Topic: General - Inquiry >> Feb 21, 2019  3:08 PM Mathis Bud wrote: Reason for CRM: patient would to get A1C checked before end of the year.  can PCP put in lab order Call back [phone]

## 2019-03-07 ENCOUNTER — Other Ambulatory Visit: Payer: Self-pay

## 2019-03-07 DIAGNOSIS — Z20822 Contact with and (suspected) exposure to covid-19: Secondary | ICD-10-CM

## 2019-03-07 NOTE — Telephone Encounter (Signed)
Order for A1C placed.

## 2019-03-07 NOTE — Telephone Encounter (Signed)
Pt scheduled lab appointment 04/10/2018 at 10.20 am

## 2019-03-10 ENCOUNTER — Other Ambulatory Visit: Payer: Self-pay | Admitting: Cardiovascular Disease

## 2019-03-10 ENCOUNTER — Other Ambulatory Visit: Payer: Self-pay

## 2019-03-10 DIAGNOSIS — I5022 Chronic systolic (congestive) heart failure: Secondary | ICD-10-CM

## 2019-03-10 DIAGNOSIS — E782 Mixed hyperlipidemia: Secondary | ICD-10-CM

## 2019-03-10 DIAGNOSIS — I739 Peripheral vascular disease, unspecified: Secondary | ICD-10-CM

## 2019-03-10 DIAGNOSIS — I10 Essential (primary) hypertension: Secondary | ICD-10-CM

## 2019-03-10 LAB — NOVEL CORONAVIRUS, NAA: SARS-CoV-2, NAA: NOT DETECTED

## 2019-03-11 ENCOUNTER — Other Ambulatory Visit (INDEPENDENT_AMBULATORY_CARE_PROVIDER_SITE_OTHER): Payer: Medicare Other

## 2019-03-11 ENCOUNTER — Encounter: Payer: Self-pay | Admitting: Family Medicine

## 2019-03-11 DIAGNOSIS — E1142 Type 2 diabetes mellitus with diabetic polyneuropathy: Secondary | ICD-10-CM | POA: Diagnosis not present

## 2019-03-11 LAB — HEMOGLOBIN A1C: Hgb A1c MFr Bld: 7.2 % — ABNORMAL HIGH (ref 4.6–6.5)

## 2019-03-12 ENCOUNTER — Ambulatory Visit (INDEPENDENT_AMBULATORY_CARE_PROVIDER_SITE_OTHER): Payer: Medicare Other | Admitting: *Deleted

## 2019-03-12 DIAGNOSIS — Z95810 Presence of automatic (implantable) cardiac defibrillator: Secondary | ICD-10-CM | POA: Diagnosis not present

## 2019-03-12 LAB — CUP PACEART REMOTE DEVICE CHECK
Battery Remaining Percentage: 51 %
Date Time Interrogation Session: 20201209080400
Implantable Lead Implant Date: 20160707
Implantable Lead Location: 753858
Implantable Lead Model: 3401
Implantable Pulse Generator Implant Date: 20160707
Pulse Gen Serial Number: 115852

## 2019-03-14 ENCOUNTER — Other Ambulatory Visit: Payer: Self-pay | Admitting: Family Medicine

## 2019-03-14 MED ORDER — GLIPIZIDE ER 2.5 MG PO TB24: 2.5000 mg | ORAL_TABLET | Freq: Every day | ORAL | 3 refills | Status: DC

## 2019-03-17 ENCOUNTER — Telehealth (INDEPENDENT_AMBULATORY_CARE_PROVIDER_SITE_OTHER): Payer: Medicare Other | Admitting: Family Medicine

## 2019-03-17 DIAGNOSIS — I5042 Chronic combined systolic (congestive) and diastolic (congestive) heart failure: Secondary | ICD-10-CM | POA: Diagnosis not present

## 2019-03-17 DIAGNOSIS — E1140 Type 2 diabetes mellitus with diabetic neuropathy, unspecified: Secondary | ICD-10-CM

## 2019-03-17 DIAGNOSIS — E039 Hypothyroidism, unspecified: Secondary | ICD-10-CM | POA: Diagnosis not present

## 2019-03-17 DIAGNOSIS — I10 Essential (primary) hypertension: Secondary | ICD-10-CM

## 2019-03-17 MED ORDER — LEVOTHYROXINE SODIUM 150 MCG PO TABS: ORAL_TABLET | ORAL | 2 refills | Status: DC

## 2019-03-17 NOTE — Progress Notes (Addendum)
Virtual Visit via Video Note  I connected with Paula Stein on 03/17/19 at  8:30 AM EST by a video enabled telemedicine application 2/2 [id number] pandemic and verified that I am speaking with the correct person using two identifiers.  Location patient: home Location provider:work or home office Persons participating in the virtual visit: patient, provider  I discussed the limitations of evaluation and management by telemedicine and the availability of in person appointments. The patient expressed understanding and agreed to proceed.   HPI: Pt is a 73 yo female with pmh sig for combined CHF, DM II with neuropathy, HTN, Hypothyroidism.  Pt states she feels good.  Recently started glipizide XL 2.5 mg as intolerant of  Metformin.   Pt no longer experiencing dizziness and diarrhea since changing meds.  FSBS 145 this am.  Pt exercising by walking.  Will go to the store and push the shopping cart.  Had recent f/u with Cardiology for CHF.  Pt denies LE edema, SOB, CP.   Pt states her [employer] gave her a machine and an Ipad ([employer]) that beeps at 9 am to tell her to check her wt, temp, and bp.  Pt concerned she will have to send the machine back as her insurance changes in January.    ROS: See pertinent positives and negatives per HPI.  Past Medical History:  Diagnosis Date  . AICD (automatic cardioverter/defibrillator) present 10/08/2014   SUBQ    /    DR [name]  . CHF (congestive heart failure) ([place])   . Diabetes mellitus without complication ([place])   . History of cardiac cath 05/2007   normal-with patent coronaries  . History of colonoscopy   . History of hiatal hernia   . Hypertension   . Iatrogenic thyroiditis   . Non-ischemic cardiomyopathy ([place])    EF 28%- reassessment of LV function 2011 with LVEf 45-50%  . PAD (peripheral artery disease) (HCC)    lower extremities with ABIs of 0.5 bilaterally  . Personal history of goiter   . S/P radioactive iodine thyroid ablation   . S/P thyroidectomy    . Shortness of breath dyspnea     Past Surgical History:  Procedure Laterality Date  . EP IMPLANTABLE DEVICE N/A 10/08/2014   Procedure: SubQ ICD Implant;  Surgeon: [name], MD;  Location: [facility];  Service: Cardiovascular;  Laterality: N/A;  . THYROIDECTOMY    . TONSILLECTOMY    . TOTAL ABDOMINAL HYSTERECTOMY      Family History  Problem Relation Age of Onset  . Diabetes type II Mother   . Hypertension Mother   . Diabetes Mother   . Heart disease Mother   . Other Father        deceased from accident age 57  . Hyperlipidemia Father   . Breast cancer Neg Hx      Current Outpatient Medications:  .  acetaminophen (TYLENOL) 500 MG tablet, Take 1 tablet (500 mg total) by mouth every 6 (six) hours as needed., Disp: 30 tablet, Rfl: 0 .  aspirin 81 MG tablet, Take 81 mg by mouth daily.  , Disp: , Rfl:  .  atorvastatin (LIPITOR) 40 MG tablet, TAKE 1 AND 1/2 TABLETS(60 MG) BY MOUTH DAILY, Disp: 135 tablet, Rfl: 0 .  carvedilol (COREG) 25 MG tablet, TAKE 1 TABLET BY MOUTH TWICE A DAY WITH FOOD, Disp: 180 tablet, Rfl: 2 .  furosemide (LASIX) 40 MG tablet, TAKE 1 TABLET BY MOUTH TWICE DAILY, Disp: 180 tablet, Rfl:  1 .  glipiZIDE (GLUCOTROL XL) 2.5 MG 24 hr tablet, Take 1 tablet (2.5 mg total) by mouth daily with breakfast., Disp: 30 tablet, Rfl: 3 .  glucose blood (ONETOUCH VERIO) test strip, Use as instructed to check blood sugar once a day, Disp: 100 each, Rfl: 1 .  hydroxychloroquine (PLAQUENIL) 200 MG tablet, Take 1 tablet by mouth 2 (two) times daily., Disp: , Rfl: 3 .  isosorbide mononitrate (IMDUR) 30 MG 24 hr tablet, Take 1 tablet (30 mg total) by mouth daily., Disp: 90 tablet, Rfl: 3 .  levothyroxine (SYNTHROID) 150 MCG tablet, TAKE 1 TABLET(150 MCG) BY MOUTH DAILY, Disp: 90 tablet, Rfl: 0 .  OneTouch Delica Lancets 38B MISC, 1 Device by Other route daily., Disp: 100 each, Rfl: 1 .  predniSONE (DELTASONE) 10 MG tablet, Take 1 tablet by mouth daily., Disp: , Rfl:  3 .  sacubitril-valsartan (ENTRESTO) 49-51 MG, Take 1 tablet by mouth 2 (two) times daily., Disp: 60 tablet, Rfl: 10 .  spironolactone (ALDACTONE) 50 MG tablet, TAKE 1 TABLET(50 MG) BY MOUTH DAILY, Disp: 90 tablet, Rfl: 3  Current Facility-Administered Medications:  .  triamcinolone acetonide (KENALOG) 10 MG/ML injection 10 mg, 10 mg, Other, Once, [place], [place], DPM  EXAM:  VITALS per patient if applicable:  RR between 12-20 bpm.  Fsbs 145 this am  GENERAL: alert, oriented, appears well and in no acute distress  HEENT: atraumatic, conjunctiva clear, no obvious abnormalities on inspection of external nose and ears  NECK: normal movements of the head and neck  LUNGS: on inspection no signs of respiratory distress, breathing rate appears normal, no obvious gross SOB, gasping or wheezing  CV: no obvious cyanosis  MS: moves all visible extremities without noticeable abnormality  PSYCH/NEURO: pleasant and cooperative, no obvious depression or anxiety, speech and thought processing grossly intact  ASSESSMENT AND PLAN:  Discussed the following assessment and plan:  Type 2 diabetes mellitus with diabetic neuropathy, without long-term current use of insulin (HCC) -improving. -last hgb A1C 7.2% on 03/11/19 -continue glipizide XL 2.5 mg daily -discussed lifestyle modifications -continue checking fsbs regularly  Hypothyroidism, unspecified type  -discussed need to recheck TSH.  Last TSH 03/13/18 was 4.651 -will obtain TSH in the next few wks.  Order placed.  Pt to make lab appt. -continue synthroid 150 mcg - Plan: levothyroxine (SYNTHROID) 150 MCG tablet, TSH  Chronic combined systolic and diastolic CHF (congestive heart failure) ([place]) -ICD in place -EF 30-35% 06/2018 -continue daily wts and limiting sodium in take -continue current meds: coreg 25 mg BID, Entresto 49-51 mg BID, spironolactone 50 mg daily, imdur 30 mg daily, lasix 40 mg BID. -continue f/u with Cardiology, Dr.  [name]  Essential hypertension -continue current meds -continue checking bp daily  F/u in the next few wks for TSH. F/u in 1-2 months for DM  I discussed the assessment and treatment plan with the patient. The patient was provided an opportunity to ask questions and all were answered. The patient agreed with the plan and demonstrated an understanding of the instructions.   The patient was advised to call back or seek an in-person evaluation if the symptoms worsen or if the condition fails to improve as anticipated.   [name], MD

## 2019-04-01 ENCOUNTER — Other Ambulatory Visit: Payer: Self-pay

## 2019-04-01 DIAGNOSIS — I10 Essential (primary) hypertension: Secondary | ICD-10-CM

## 2019-04-01 MED ORDER — ISOSORBIDE MONONITRATE ER 30 MG PO TB24: 30.0000 mg | ORAL_TABLET | Freq: Every day | ORAL | 3 refills | Status: DC

## 2019-04-19 NOTE — Progress Notes (Signed)
ICD remote 

## 2019-04-23 DIAGNOSIS — M329 Systemic lupus erythematosus, unspecified: Secondary | ICD-10-CM | POA: Diagnosis not present

## 2019-04-23 DIAGNOSIS — M064 Inflammatory polyarthropathy: Secondary | ICD-10-CM | POA: Diagnosis not present

## 2019-04-23 DIAGNOSIS — R768 Other specified abnormal immunological findings in serum: Secondary | ICD-10-CM | POA: Diagnosis not present

## 2019-04-23 DIAGNOSIS — M109 Gout, unspecified: Secondary | ICD-10-CM | POA: Diagnosis not present

## 2019-05-14 ENCOUNTER — Other Ambulatory Visit: Payer: Self-pay | Admitting: Cardiovascular Disease

## 2019-05-14 DIAGNOSIS — I10 Essential (primary) hypertension: Secondary | ICD-10-CM

## 2019-05-19 ENCOUNTER — Telehealth: Payer: Self-pay | Admitting: *Deleted

## 2019-05-19 ENCOUNTER — Telehealth: Payer: Self-pay

## 2019-05-19 DIAGNOSIS — E1149 Type 2 diabetes mellitus with other diabetic neurological complication: Secondary | ICD-10-CM | POA: Diagnosis not present

## 2019-05-19 DIAGNOSIS — E119 Type 2 diabetes mellitus without complications: Secondary | ICD-10-CM | POA: Diagnosis not present

## 2019-05-19 NOTE — Telephone Encounter (Signed)
The pt states her monitor was down and she called [place] and she received a new piece to the monitor. I told her thank you for letting me know and the only time she would have to send is on the scheduled date. The pt verbalized understanding.

## 2019-06-06 ENCOUNTER — Other Ambulatory Visit: Payer: Self-pay | Admitting: Family Medicine

## 2019-06-06 DIAGNOSIS — E7849 Other hyperlipidemia: Secondary | ICD-10-CM

## 2019-06-11 ENCOUNTER — Ambulatory Visit (INDEPENDENT_AMBULATORY_CARE_PROVIDER_SITE_OTHER): Payer: Medicare PPO | Admitting: *Deleted

## 2019-06-11 DIAGNOSIS — Z95810 Presence of automatic (implantable) cardiac defibrillator: Secondary | ICD-10-CM | POA: Diagnosis not present

## 2019-06-11 LAB — CUP PACEART REMOTE DEVICE CHECK
Battery Remaining Percentage: 15 %
Date Time Interrogation Session: 20210310072800
Implantable Lead Implant Date: 20160707
Implantable Lead Location: 753862
Implantable Lead Model: 3401
Implantable Pulse Generator Implant Date: 20160707
Pulse Gen Serial Number: 115852

## 2019-06-12 NOTE — Progress Notes (Signed)
ICD Remote  

## 2019-06-13 ENCOUNTER — Ambulatory Visit: Payer: Medicare PPO | Admitting: Internal Medicine

## 2019-06-13 ENCOUNTER — Encounter: Payer: Self-pay | Admitting: Internal Medicine

## 2019-06-13 ENCOUNTER — Other Ambulatory Visit: Payer: Self-pay

## 2019-06-13 VITALS — BP 152/80 | HR 84 | Ht 70.5 in | Wt 231.8 lb

## 2019-06-13 DIAGNOSIS — Z79899 Other long term (current) drug therapy: Secondary | ICD-10-CM

## 2019-06-13 DIAGNOSIS — I428 Other cardiomyopathies: Secondary | ICD-10-CM

## 2019-06-13 DIAGNOSIS — Z95810 Presence of automatic (implantable) cardiac defibrillator: Secondary | ICD-10-CM | POA: Diagnosis not present

## 2019-06-13 NOTE — Progress Notes (Signed)
Patient Care Team: [name], MD as PCP - General (Family Medicine) [name], MD as PCP - Cardiology (Cardiology) [name], MD as PCP - Electrophysiology (Cardiology)   HPI  Paula Stein is a 74 y.o. 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  She is here today because of class 1 advisory recall  The patient denies chest pain, nocturnal dyspnea, orthopnea or peripheral edema.  There have been no palpitations, lightheadedness or syncope  Has put on 15 obls in recent months since starting on prednisone  This assoc with significant SOB at less than 2 min    DATE TEST EF   4/16 Echo  30-35 % Severe LVH/LAE  2/19 Echo 25-30% Severe LVH  3/20 Echo  30-35%     Date Cr K TSH  12/17 1.02 4.4 6.34  9/18 1.22 4.4 39.32  6/19 0.99 4.3 2.78  6/20  1.48 4.1 (12/19) 4.6            hyperthyroidism s/p RAI ablation, diabetes,   Past Medical History:  Diagnosis Date  . AICD (automatic cardioverter/defibrillator) present 10/08/2014   SUBQ    /    DR [name]  . CHF (congestive heart failure) ([place])   . Diabetes mellitus without complication ([place])   . History of cardiac cath 05/2007   normal-with patent coronaries  . History of colonoscopy   . History of hiatal hernia   . Hypertension   . Iatrogenic thyroiditis   . Non-ischemic cardiomyopathy ([place])    EF 28%- reassessment of LV function 2011 with LVEf 45-50%  . PAD (peripheral artery disease) (HCC)    lower extremities with ABIs of 0.5 bilaterally  . Personal history of goiter   . S/P radioactive iodine thyroid ablation   . S/P thyroidectomy   . Shortness of breath dyspnea     Past Surgical History:  Procedure Laterality Date  . EP IMPLANTABLE DEVICE N/A 10/08/2014   Procedure: SubQ ICD Implant;  Surgeon: [name], MD;  Location: [facility];  Service: Cardiovascular;  Laterality: N/A;  . THYROIDECTOMY    . TONSILLECTOMY    . TOTAL ABDOMINAL HYSTERECTOMY      Current Outpatient Medications  Medication Sig Dispense Refill  . acetaminophen (TYLENOL) 500 MG tablet Take 1 tablet (500 mg total) by mouth every 6 (six) hours as needed. 30 tablet 0  . allopurinol (ZYLOPRIM) 100 MG tablet Take 100 mg by mouth daily.    Marland Kitchen aspirin 81 MG tablet Take 81 mg by mouth daily.      Marland Kitchen atorvastatin (LIPITOR) 40 MG tablet TAKE 1 AND 1/2 TABLETS(60 MG) BY MOUTH DAILY 135 tablet 0  . carvedilol (COREG) 25 MG tablet TAKE 1 TABLET BY MOUTH TWICE A DAY WITH FOOD 180 tablet 2  . furosemide (LASIX) 40 MG tablet TAKE 1 TABLET BY MOUTH TWICE DAILY 180 tablet 1  . glipiZIDE (GLUCOTROL XL) 2.5 MG 24 hr tablet Take 1 tablet (2.5 mg total) by mouth daily with breakfast. 30 tablet 3  . glucose blood (ONETOUCH VERIO) test strip Use as instructed to check blood sugar once a day 100 each 1  . hydroxychloroquine (PLAQUENIL) 200 MG tablet Take 1 tablet by mouth 2 (two) times daily.  3  . isosorbide mononitrate (IMDUR) 30 MG 24 hr tablet TAKE 1 TABLET(30 MG) BY MOUTH  DAILY 90 tablet 2  . levothyroxine (SYNTHROID) 150 MCG tablet TAKE 1 TABLET(150 MCG) BY MOUTH DAILY 90 tablet 2  . OneTouch Delica Lancets 81M MISC 1 Device by Other route daily. 100 each 1  . predniSONE (DELTASONE) 10 MG tablet Take 1 tablet by mouth daily.  3  . sacubitril-valsartan (ENTRESTO) 49-51 MG Take 1 tablet by mouth 2 (two) times daily. 60 tablet 10  . spironolactone (ALDACTONE) 50 MG tablet TAKE 1 TABLET(50 MG) BY MOUTH DAILY 90 tablet 3   Current Facility-Administered Medications  Medication Dose Route Frequency Provider Last Rate Last Admin  . triamcinolone acetonide (KENALOG) 10 MG/ML injection 10 mg  10 mg Other Once Landis Martins, DPM        Allergies  Allergen Reactions  . Ibuprofen Nausea Only      Review of Systems negative except from HPI and PMH  Physical Exam BP  (!) 152/80   Pulse 84   Ht 5' 10.5" (1.791 m)   Wt 231 lb 12.8 oz (105.1 kg)   SpO2 98%   BMI 32.79 kg/m  Well developed and well nourished in no acute distress HENT normal Neck supple with JVP-flat Clear Device pocket well healed; without hematoma or erythema.  There is no tethering  Regular rate and rhythm, no  murmur Abd-soft with active BS No Clubbing cyanosis   edema Skin-warm and dry A & Oriented  Grossly normal sensory and motor function       Assessment and  Plan   Nonischemic cardiomyopathy  CHF  Chronic systolic  ICD-Subcutaneous       Class 1 recall Generator premature depletion  Obesity  Hypertension  High Risk Medication Surveillance aldactone    Euvolemic continue current meds  Significant weight gain  enocouraged dieting exercise and talking to her MD re prednisone down titration   May be a candidate SCLT2 inhibitor  The patient's SICD generator is subject to a grade one recall because of risk of premature battery depletion   on the system resulting in potential inability to deliver all or any of an appropriate shock.  We have discussed the risk of this event and given the patients status as primary prevention we have elected to continue to monitor and not  replace the generator at this time  We have reviewed the importance of regular interrogation of the device so as to have some opportunity for alerts, have demonstrated the beep tones and stressed the importance of [place] checking BEEP tones following MRI

## 2019-06-13 NOTE — Patient Instructions (Addendum)
Medication Instructions:  Your physician recommends that you continue on your current medications as directed. Please refer to the Current Medication list given to you today.  Labwork: You will have labs drawn today: BMET    Testing/Procedures: None ordered.  Follow-Up: Your physician wants you to follow-up in: 12 months with Dr [name]. You will receive a reminder letter in the mail two months in advance. If you don't receive a letter, please call our office to schedule the follow-up appointment.  Remote monitoring is used to monitor your Pacemaker of ICD from home. This monitoring reduces the number of office visits required to check your device to one time per year. It allows [place] to keep an eye on the functioning of your device to ensure it is working properly.  Any Other Special Instructions Will Be Listed Below (If Applicable).  If you need a refill on your cardiac medications before your next appointment, please call your pharmacy.

## 2019-06-14 LAB — BASIC METABOLIC PANEL
BUN/Creatinine Ratio: 13 (ref 12–28)
BUN: 17 mg/dL (ref 8–27)
CO2: 21 mmol/L (ref 20–29)
Calcium: 9.6 mg/dL (ref 8.7–10.3)
Chloride: 107 mmol/L — ABNORMAL HIGH (ref 96–106)
Creatinine, Ser: 1.29 mg/dL — ABNORMAL HIGH (ref 0.57–1.00)
GFR calc Af Amer: 47 mL/min/{1.73_m2} — ABNORMAL LOW (ref >59)
GFR calc non Af Amer: 41 mL/min/{1.73_m2} — ABNORMAL LOW (ref >59)
Glucose: 145 mg/dL — ABNORMAL HIGH (ref 65–99)
Potassium: 4.8 mmol/L (ref 3.5–5.2)
Sodium: 143 mmol/L (ref 134–144)

## 2019-07-04 ENCOUNTER — Ambulatory Visit (INDEPENDENT_AMBULATORY_CARE_PROVIDER_SITE_OTHER): Payer: Medicare PPO

## 2019-07-04 ENCOUNTER — Other Ambulatory Visit: Payer: Self-pay

## 2019-07-04 ENCOUNTER — Ambulatory Visit (HOSPITAL_COMMUNITY)
Admission: EM | Admit: 2019-07-04 | Discharge: 2019-07-04 | Disposition: A | Discharge status: Home / Self Care | Payer: Medicare PPO | Attending: Family Medicine | Admitting: Family Medicine

## 2019-07-04 ENCOUNTER — Encounter (HOSPITAL_COMMUNITY): Payer: Self-pay

## 2019-07-04 DIAGNOSIS — Z950 Presence of cardiac pacemaker: Secondary | ICD-10-CM | POA: Diagnosis not present

## 2019-07-04 DIAGNOSIS — K2900 Acute gastritis without bleeding: Secondary | ICD-10-CM | POA: Diagnosis not present

## 2019-07-04 DIAGNOSIS — R1010 Upper abdominal pain, unspecified: Secondary | ICD-10-CM | POA: Diagnosis not present

## 2019-07-04 DIAGNOSIS — R0602 Shortness of breath: Secondary | ICD-10-CM | POA: Diagnosis not present

## 2019-07-04 DIAGNOSIS — R062 Wheezing: Secondary | ICD-10-CM | POA: Diagnosis not present

## 2019-07-04 DIAGNOSIS — R110 Nausea: Secondary | ICD-10-CM | POA: Diagnosis not present

## 2019-07-04 MED ORDER — ONDANSETRON HCL 4 MG PO TABS: 4.0000 mg | ORAL_TABLET | Freq: Four times a day (QID) | ORAL | 0 refills | Status: DC

## 2019-07-04 MED ORDER — OMEPRAZOLE 20 MG PO CPDR: 20.0000 mg | DELAYED_RELEASE_CAPSULE | Freq: Every day | ORAL | 1 refills | Status: DC

## 2019-07-04 NOTE — Discharge Instructions (Addendum)
You are likely experiencing gastritis.  I have sent in a prescription for Prilosec, and I would have you take this once a day.  If your symptoms are not improving, or worsening but have you follow-up here or with your primary care provider.  I have also sent in some Zofran for nausea for you.  You may take 1 tablet by mouth every 6 hours as needed for nausea.  Your chest x-ray today did not show any acute lung abnormalities.  If you are continuing to be unable to tolerate foods, and become unable to tolerate liquids, I would have you go to the emergency department for fluid replacement.

## 2019-07-04 NOTE — ED Provider Notes (Signed)
Paula Stein    CSN: [id number] Arrival date & time: 07/04/19  9485      History   Chief Complaint Chief Complaint  Patient presents with  . Abdominal Pain    HPI Paula Stein is a 74 y.o. female.   She has been experiencing nausea, vomiting, abdominal pain for the last 5 days.  Reports that she can keep down some liquids, but solid foods have not been able to stay down.  Patient reports that she is still voiding normally.  Patient reports that she ate grapes on Monday, and has been having this issue since then.  She is attributing her symptoms to grapes.  Per chart review, patient has significant medical history for presence of an automated defibrillator/cardioverter, CHF, diabetes, hypertension, status post thyroidectomy.  Denies headache, cough, shortness of breath, diarrhea, body aches, chills, rash, fever, other symptoms.  Patient also denies history of diverticulitis/diverticulosis.  ROS per HPI  The history is provided by the patient.    Past Medical History:  Diagnosis Date  . AICD (automatic cardioverter/defibrillator) present 10/08/2014   SUBQ    /    DR [name]  . CHF (congestive heart failure) ([place])   . Diabetes mellitus without complication ([place])   . History of cardiac cath 05/2007   normal-with patent coronaries  . History of colonoscopy   . History of hiatal hernia   . Hypertension   . Iatrogenic thyroiditis   . Non-ischemic cardiomyopathy ([place])    EF 28%- reassessment of LV function 2011 with LVEf 45-50%  . PAD (peripheral artery disease) ([facility])    lower extremities with ABIs of 0.5 bilaterally  . Personal history of goiter   . S/P radioactive iodine thyroid ablation   . S/P thyroidectomy   . Shortness of breath dyspnea     Patient Active Problem List   Diagnosis Date Noted  . Acute on chronic combined systolic and diastolic CHF (congestive heart failure) ([place]) 03/13/2018  . Non-ischemic cardiomyopathy ([place])   . Hypertension   . Diabetes  mellitus without complication ([place])   . CAD (coronary artery disease) 06/18/2017  . PAD (peripheral artery disease) ([place]) 06/18/2017  . ICD (implantable cardioverter-defibrillator) in place 06/18/2017  . Chronic combined systolic and diastolic CHF (congestive heart failure) ([place]) 05/22/2017  . Hyperlipidemia associated with type 2 diabetes mellitus ([place]) 12/11/2016  . Treatment-emergent central sleep apnea 09/20/2016  . NICM (nonischemic cardiomyopathy) ([place]) 10/08/2014  . AICD (automatic cardioverter/defibrillator) present 10/08/2014  . Cigarette smoker 10/30/2012  . 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 DeQuervain's disease (tenosynovitis) 09/01/2010  . Atherosclerosis 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 ([place]) 12/07/2008  . Diabetes ([place]) 10/29/2008  . Hypothyroidism 01/31/2007  . Essential hypertension 01/31/2007  . GERD 01/31/2007    Past Surgical History:  Procedure Laterality Date  . EP IMPLANTABLE DEVICE N/A 10/08/2014   Procedure: SubQ ICD Implant;  Surgeon: [name], MD;  Location: [facility];  Service: Cardiovascular;  Laterality: N/A;  . THYROIDECTOMY    . TONSILLECTOMY    . TOTAL ABDOMINAL HYSTERECTOMY      OB History   No obstetric history on file.      Home Medications    Prior to Admission medications   Medication Sig Start Date End Date Taking? Authorizing Provider  acetaminophen (TYLENOL) 500 MG tablet Take 1 tablet (500 mg total) by mouth every 6 (six) hours as needed. Patient taking differently: Take 500 mg by mouth every 6 (six) hours  as needed for mild pain.  09/29/18   Zigmund Gottron, NP  allopurinol (ZYLOPRIM) 100 MG tablet Take 100 mg by mouth daily. 03/31/19   [provider]  aspirin 81 MG tablet Take 81 mg by mouth daily.      [provider]  atorvastatin (LIPITOR) 40 MG tablet TAKE 1 AND 1/2 TABLETS(60 MG) BY MOUTH DAILY Patient taking differently: Take 50 mg by mouth daily.  06/06/19    [name], MD  carvedilol (COREG) 25 MG tablet TAKE 1 TABLET BY MOUTH TWICE A DAY WITH FOOD Patient taking differently: Take 25 mg by mouth 2 (two) times daily with a meal.  09/11/18   [name], MD  cephALEXin (KEFLEX) 250 MG capsule Take 1 capsule (250 mg total) by mouth 4 (four) times daily. 07/05/19   Dorie Rank, MD  furosemide (LASIX) 40 MG tablet TAKE 1 TABLET BY MOUTH TWICE DAILY Patient taking differently: Take 40 mg by mouth 2 (two) times daily.  08/27/18   [name], MD  glipiZIDE (GLUCOTROL XL) 2.5 MG 24 hr tablet Take 1 tablet (2.5 mg total) by mouth daily with breakfast. 03/14/19   [name], MD  glucose blood (ONETOUCH VERIO) test strip Use as instructed to check blood sugar once a day Patient taking differently: 1 each by Other route See admin instructions. Use as instructed to check blood sugar once a day 01/15/19   [name], MD  hydroxychloroquine (PLAQUENIL) 200 MG tablet Take 1 tablet by mouth 2 (two) times daily. 11/01/17   [provider]  isosorbide mononitrate (IMDUR) 30 MG 24 hr tablet TAKE 1 TABLET(30 MG) BY MOUTH DAILY Patient taking differently: Take 30 mg by mouth daily.  05/14/19   [name], MD  levothyroxine (SYNTHROID) 150 MCG tablet TAKE 1 TABLET(150 MCG) BY MOUTH DAILY Patient taking differently: Take 150 mcg by mouth daily.  03/17/19   [name], MD  omeprazole (PRILOSEC) 20 MG capsule Take 1 capsule (20 mg total) by mouth daily. 07/04/19   Faustino Congress, NP  ondansetron (ZOFRAN) 4 MG tablet Take 1 tablet (4 mg total) by mouth every 6 (six) hours. 07/04/19   Faustino Congress, NP  OneTouch Delica Lancets 54Y MISC 1 Device by Other route daily. 01/15/19   [name], MD  predniSONE (DELTASONE) 10 MG tablet Take 1 tablet by mouth daily as needed (Pain.).  11/01/17   [provider]  sacubitril-valsartan (ENTRESTO) 49-51 MG Take 1 tablet by mouth 2 (two) times daily. 06/12/18   [name], MD    spironolactone (ALDACTONE) 50 MG tablet TAKE 1 TABLET(50 MG) BY MOUTH DAILY Patient taking differently: Take 50 mg by mouth daily.  03/10/19   [name], MD    Family History Family History  Problem Relation Age of Onset  . Diabetes type II Mother   . Hypertension Mother   . Diabetes Mother   . Heart disease Mother   . Other Father        deceased from accident age 32  . Hyperlipidemia Father   . Breast cancer Neg Hx     Social History Social History   Tobacco Use  . Smoking status: Former Smoker    Packs/day: 0.25    Years: 40.00    Pack years: 10.00    Types: Cigarettes    Quit date: 07/03/2018    Years since quitting: 1.0  . Smokeless tobacco: Never Used  . Tobacco comment: up to 1  a day  Substance Use Topics  . Alcohol use: Yes    Alcohol/week: 0.0 standard drinks    Comment: rare glass of wine  . Drug use: No     Allergies   Ibuprofen   Review of Systems Review of Systems   Physical Exam Triage Vital Signs ED Triage Vitals  Enc Vitals Group     BP 07/04/19 0928 120/63     Pulse Rate 07/04/19 0928 84     Resp 07/04/19 0928 (!) 21     Temp 07/04/19 0928 98.7 F (37.1 C)     Temp Source 07/04/19 0928 Oral     SpO2 07/04/19 0928 100 %     Weight 07/04/19 0926 215 lb (97.5 kg)     Height --      Head Circumference --      Peak Flow --      Pain Score 07/04/19 0926 8     Pain Loc --      Pain Edu? --      Excl. in [place]? --    No data found.  Updated Vital Signs BP 120/63 (BP Location: Right Arm)   Pulse 84   Temp 98.7 F (37.1 C) (Oral)   Resp (!) 21   Wt 215 lb (97.5 kg)   SpO2 100%   BMI 30.41 kg/m   Visual Acuity Right Eye Distance:   Left Eye Distance:   Bilateral Distance:    Right Eye Near:   Left Eye Near:    Bilateral Near:     Physical Exam Vitals and nursing note reviewed.  Constitutional:      General: She is not in acute distress.    Appearance: She is well-developed. She is obese. She is not ill-appearing.   HENT:     Head: Normocephalic and atraumatic.     Mouth/Throat:     Mouth: Mucous membranes are moist.     Pharynx: Oropharynx is clear.  Eyes:     Extraocular Movements: Extraocular movements intact.     Conjunctiva/sclera: Conjunctivae normal.     Pupils: Pupils are equal, round, and reactive to light.  Cardiovascular:     Rate and Rhythm: Normal rate and regular rhythm.     Heart sounds: Normal heart sounds. No murmur.  Pulmonary:     Effort: Pulmonary effort is normal. No respiratory distress.     Breath sounds: No stridor. Wheezing present. No rhonchi or rales.  Chest:     Chest wall: No tenderness.  Abdominal:     General: There is no distension or abdominal bruit. There are no signs of injury.     Palpations: Abdomen is soft.     Tenderness: There is generalized abdominal tenderness.     Hernia: No hernia is present.  Musculoskeletal:     Cervical back: Neck supple.  Skin:    General: Skin is warm and dry.     Capillary Refill: Capillary refill takes less than 2 seconds.  Neurological:     General: No focal deficit present.     Mental Status: She is alert and oriented to person, place, and time.  Psychiatric:        Mood and Affect: Mood normal.        Behavior: Behavior normal.      UC Treatments / Results  Labs (all labs ordered are listed, but only abnormal results are displayed) Labs Reviewed - No data to display  EKG   Radiology DG Chest 2 View  Result Date: 07/04/2019 CLINICAL DATA:  Shortness of breath.  Defibrillator placement EXAM: CHEST - 2 VIEW COMPARISON:  March 13, 2018 FINDINGS: There is slight scarring in each lower lung region. There is no evident edema or consolidation. Heart is enlarged with pulmonary vascularity within normal limits, stable. Defibrillator present anteriorly slightly to the left of midline, stable. No pneumothorax. No bone lesions. No adenopathy. IMPRESSION: Stable cardiac prominence. Stable defibrillator lead placement.  Areas of scarring in each lower lobe. No edema or consolidation. Electronically Signed   By: Lowella Grip III M.D.   On: 07/04/2019 09:54   CT ABDOMEN PELVIS W CONTRAST  Result Date: 07/05/2019 CLINICAL DATA:  Suspected diverticulitis. Epigastric pain. EXAM: CT ABDOMEN AND PELVIS WITH CONTRAST TECHNIQUE: Multidetector CT imaging of the abdomen and pelvis was performed using the standard protocol following bolus administration of intravenous contrast. CONTRAST:  19mL OMNIPAQUE IOHEXOL 300 MG/ML  SOLN COMPARISON:  None. FINDINGS: Lower chest: Scarring versus atelectasis in the bilateral lower lobes. Cylindrical bronchiectasis in the visualized bronchi. Hepatobiliary: Mild hepatic steatosis. Numerous circumscribed hepatic masses measuring fluid density. Some less than 1 cm hepatic lesions too small to be actually characterize by CT. Normal appearance of the gallbladder. Pancreas: Unremarkable. No pancreatic ductal dilatation or surrounding inflammatory changes. Spleen: Normal in size without focal abnormality. Adrenals/Urinary Tract: Adrenal glands are unremarkable. Kidneys are normal, without renal calculi, solid lesion, or hydronephrosis. Bilateral renal cysts with a large 4.4 cm cyst in the upper pole of the right kidney and 3.9 renal cyst in the lower pole of the left kidney. 2 cm fat containing partially exophytic mass off of the medial lower cortex of the left kidney. Bladder is unremarkable. Stomach/Bowel: Stomach is within normal limits. Appendix appears normal. No evidence of small bowel wall thickening, distention, or inflammatory changes. Extensive diverticulosis of the sigmoid and less so descending colon with chronic inflammatory changes. No evidence of acute diverticulitis. Vascular/Lymphatic: Aortic atherosclerosis. No enlarged abdominal or pelvic lymph nodes. Reproductive: Status post hysterectomy. No adnexal masses. Other: No abdominal wall hernia or abnormality. No abdominopelvic ascites.  Musculoskeletal: L5-S1 spondylosis. IMPRESSION: 1. Mild hepatic steatosis. 2. Numerous liver cysts. Some less than 1 cm hepatic lesions too small to be actually characterize by CT. 3. Bilateral renal cysts. 4. 2 cm fat containing left renal mass. This may represent an angiomyolipoma. 5. Extensive diverticulosis of the sigmoid and less so descending colon with chronic inflammatory changes. No evidence of acute diverticulitis. 6. Cylindrical bronchiectasis and scarring in the visualized lower lobes of the lungs. Aortic Atherosclerosis (ICD10-I70.0). Electronically Signed   By: Fidela Salisbury M.D.   On: 07/05/2019 13:34    Procedures Procedures (including critical care time)  Medications Ordered in UC Medications - No data to display  Initial Impression / Assessment and Plan / UC Course  I have reviewed the triage vital signs and the nursing notes.  Pertinent labs & imaging results that were available during my care of the patient were reviewed by me and considered in my medical decision making (see chart for details).     Abdominal pain: Also experiencing nausea with vomiting of solid foods.  Able to keep down liquids.  Likely gastritis, prescribed omeprazole 20 mg to take daily.  Also prescribed Zofran 4 mg every 6 hours as needed for nausea.  Discussed with patient that this could be a viral gastritis.  Instructed patient to follow-up with the ER for trouble swallowing, trouble breathing, high fever, not being able to keep down liquids as she  then may need fluid replacement.  Patient verbalizes understanding and agrees with treatment plan.  Wheezing: Patient has history of CHF, chest x-ray today shows no concern for fluid overload.  Chest x-ray does not show any indication of acute cardiac or pulmonary disease. Final Clinical Impressions(s) / UC Diagnoses   Final diagnoses:  Pain of upper abdomen  Nausea  Acute gastritis without hemorrhage, unspecified gastritis type  Wheezing      Discharge Instructions     You are likely experiencing gastritis.  I have sent in a prescription for Prilosec, and I would have you take this once a day.  If your symptoms are not improving, or worsening but have you follow-up here or with your primary care provider.  I have also sent in some Zofran for nausea for you.  You may take 1 tablet by mouth every 6 hours as needed for nausea.  Your chest x-ray today did not show any acute lung abnormalities.  If you are continuing to be unable to tolerate foods, and become unable to tolerate liquids, I would have you go to the emergency department for fluid replacement.    ED Prescriptions    Medication Sig Dispense Auth. Provider   omeprazole (PRILOSEC) 20 MG capsule Take 1 capsule (20 mg total) by mouth daily. 30 capsule Faustino Congress, NP   ondansetron (ZOFRAN) 4 MG tablet Take 1 tablet (4 mg total) by mouth every 6 (six) hours. 12 tablet Faustino Congress, NP     PDMP not reviewed this encounter.   Faustino Congress, NP 07/05/19 2121

## 2019-07-04 NOTE — ED Triage Notes (Signed)
Pt is here with abdominal pain that started Monday after eating grapes, pt has taken Pepto to relieve discomfort.

## 2019-07-05 ENCOUNTER — Emergency Department (HOSPITAL_COMMUNITY): Payer: Medicare PPO

## 2019-07-05 ENCOUNTER — Emergency Department (HOSPITAL_COMMUNITY)
Admission: EM | Admit: 2019-07-05 | Discharge: 2019-07-05 | Disposition: A | Discharge status: Home / Self Care | Payer: Medicare PPO | Attending: Emergency Medicine | Admitting: Emergency Medicine

## 2019-07-05 ENCOUNTER — Other Ambulatory Visit: Payer: Self-pay

## 2019-07-05 ENCOUNTER — Encounter (HOSPITAL_COMMUNITY): Payer: Self-pay | Admitting: Emergency Medicine

## 2019-07-05 DIAGNOSIS — Z79899 Other long term (current) drug therapy: Secondary | ICD-10-CM | POA: Diagnosis not present

## 2019-07-05 DIAGNOSIS — K760 Fatty (change of) liver, not elsewhere classified: Secondary | ICD-10-CM | POA: Diagnosis not present

## 2019-07-05 DIAGNOSIS — Z95810 Presence of automatic (implantable) cardiac defibrillator: Secondary | ICD-10-CM | POA: Insufficient documentation

## 2019-07-05 DIAGNOSIS — Z87891 Personal history of nicotine dependence: Secondary | ICD-10-CM | POA: Insufficient documentation

## 2019-07-05 DIAGNOSIS — I2510 Atherosclerotic heart disease of native coronary artery without angina pectoris: Secondary | ICD-10-CM | POA: Diagnosis not present

## 2019-07-05 DIAGNOSIS — N281 Cyst of kidney, acquired: Secondary | ICD-10-CM | POA: Diagnosis not present

## 2019-07-05 DIAGNOSIS — E119 Type 2 diabetes mellitus without complications: Secondary | ICD-10-CM | POA: Insufficient documentation

## 2019-07-05 DIAGNOSIS — E039 Hypothyroidism, unspecified: Secondary | ICD-10-CM | POA: Diagnosis not present

## 2019-07-05 DIAGNOSIS — Z7984 Long term (current) use of oral hypoglycemic drugs: Secondary | ICD-10-CM | POA: Diagnosis not present

## 2019-07-05 DIAGNOSIS — N3000 Acute cystitis without hematuria: Secondary | ICD-10-CM | POA: Diagnosis not present

## 2019-07-05 DIAGNOSIS — Z7982 Long term (current) use of aspirin: Secondary | ICD-10-CM | POA: Insufficient documentation

## 2019-07-05 DIAGNOSIS — R778 Other specified abnormalities of plasma proteins: Secondary | ICD-10-CM | POA: Insufficient documentation

## 2019-07-05 DIAGNOSIS — I5042 Chronic combined systolic (congestive) and diastolic (congestive) heart failure: Secondary | ICD-10-CM | POA: Diagnosis not present

## 2019-07-05 DIAGNOSIS — I110 Hypertensive heart disease with heart failure: Secondary | ICD-10-CM | POA: Insufficient documentation

## 2019-07-05 DIAGNOSIS — R1010 Upper abdominal pain, unspecified: Secondary | ICD-10-CM | POA: Diagnosis present

## 2019-07-05 DIAGNOSIS — I10 Essential (primary) hypertension: Secondary | ICD-10-CM | POA: Diagnosis not present

## 2019-07-05 LAB — URINALYSIS, ROUTINE W REFLEX MICROSCOPIC
Bilirubin Urine: NEGATIVE
Glucose, UA: NEGATIVE mg/dL
Ketones, ur: NEGATIVE mg/dL
Nitrite: NEGATIVE
Protein, ur: >=300 mg/dL — AB
Specific Gravity, Urine: 1.022 (ref 1.005–1.030)
WBC, UA: >50 WBC/hpf — ABNORMAL HIGH (ref 0–5)
pH: 5 (ref 5.0–8.0)

## 2019-07-05 LAB — COMPREHENSIVE METABOLIC PANEL
ALT: 18 U/L (ref 0–44)
AST: 17 U/L (ref 15–41)
Albumin: 2.9 g/dL — ABNORMAL LOW (ref 3.5–5.0)
Alkaline Phosphatase: 92 U/L (ref 38–126)
Anion gap: 13 (ref 5–15)
BUN: 13 mg/dL (ref 8–23)
CO2: 23 mmol/L (ref 22–32)
Calcium: 9.3 mg/dL (ref 8.9–10.3)
Chloride: 101 mmol/L (ref 98–111)
Creatinine, Ser: 1.73 mg/dL — ABNORMAL HIGH (ref 0.44–1.00)
GFR calc Af Amer: 33 mL/min — ABNORMAL LOW (ref >60)
GFR calc non Af Amer: 28 mL/min — ABNORMAL LOW (ref >60)
Glucose, Bld: 164 mg/dL — ABNORMAL HIGH (ref 70–99)
Potassium: 4.3 mmol/L (ref 3.5–5.1)
Sodium: 137 mmol/L (ref 135–145)
Total Bilirubin: 1.9 mg/dL — ABNORMAL HIGH (ref 0.3–1.2)
Total Protein: 7.7 g/dL (ref 6.5–8.1)

## 2019-07-05 LAB — CBC
HCT: 40.7 % (ref 36.0–46.0)
Hemoglobin: 12.5 g/dL (ref 12.0–15.0)
MCH: 30.6 pg (ref 26.0–34.0)
MCHC: 30.7 g/dL (ref 30.0–36.0)
MCV: 99.5 fL (ref 80.0–100.0)
Platelets: 207 10*3/uL (ref 150–400)
RBC: 4.09 MIL/uL (ref 3.87–5.11)
RDW: 13.3 % (ref 11.5–15.5)
WBC: 17.6 10*3/uL — ABNORMAL HIGH (ref 4.0–10.5)
nRBC: 0 % (ref 0.0–0.2)

## 2019-07-05 LAB — TROPONIN I (HIGH SENSITIVITY)
Troponin I (High Sensitivity): 31 ng/L — ABNORMAL HIGH
Troponin I (High Sensitivity): 28 ng/L — ABNORMAL HIGH (ref <18)

## 2019-07-05 LAB — LIPASE, BLOOD: Lipase: 21 U/L (ref 11–51)

## 2019-07-05 MED ORDER — IOHEXOL 300 MG/ML  SOLN
75.0000 mL | Freq: Once | INTRAMUSCULAR | Status: AC | PRN
  Administered 2019-07-05: 13:00:00 75 mL via INTRAVENOUS

## 2019-07-05 MED ORDER — SODIUM CHLORIDE 0.9 % IV SOLN
1000.0000 mL | INTRAVENOUS | Status: DC
  Administered 2019-07-05: 13:00:00 1000 mL via INTRAVENOUS

## 2019-07-05 MED ORDER — CEPHALEXIN 250 MG PO CAPS: 250.0000 mg | ORAL_CAPSULE | Freq: Four times a day (QID) | ORAL | 0 refills | Status: DC

## 2019-07-05 MED ORDER — SODIUM CHLORIDE 0.9 % IV SOLN
1.0000 g | Freq: Once | INTRAVENOUS | Status: AC
  Administered 2019-07-05: 1 g via INTRAVENOUS
  Filled 2019-07-05: qty 10

## 2019-07-05 MED ORDER — SODIUM CHLORIDE 0.9% FLUSH: 3.0000 mL | Freq: Once | INTRAVENOUS | Status: DC

## 2019-07-05 MED ORDER — SODIUM CHLORIDE 0.9 % IV BOLUS (SEPSIS)
500.0000 mL | Freq: Once | INTRAVENOUS | Status: AC
  Administered 2019-07-05: 500 mL via INTRAVENOUS

## 2019-07-05 MED ORDER — ONDANSETRON HCL 4 MG/2ML IJ SOLN
4.0000 mg | Freq: Once | INTRAMUSCULAR | Status: AC
  Administered 2019-07-05: 4 mg via INTRAVENOUS
  Filled 2019-07-05: qty 2

## 2019-07-05 MED ORDER — MORPHINE SULFATE (PF) 4 MG/ML IV SOLN
4.0000 mg | Freq: Once | INTRAVENOUS | Status: AC
  Administered 2019-07-05: 4 mg via INTRAVENOUS
  Filled 2019-07-05: qty 1

## 2019-07-05 NOTE — ED Provider Notes (Signed)
[facility] EMERGENCY DEPARTMENT Provider Note   CSN: [id number] Arrival date & time: 07/05/19  1019     History Chief Complaint  Patient presents with  . Abdominal Pain    Paula Stein is a 74 y.o. female.  HPI    Patient states she started having abdominal pain on Monday.  Patient states she was feeling well.  She made a fruit cup.  After eating the fruit cup she had an episode of nausea and vomiting and began having pain mostly in her upper abdomen.  She has not had any issues with vomiting since that first episode but she has not had much of an appetite.  She has not really eaten anything but has been able to drink fluids.  Patient has tried taking over-the-counter medications without much relief.  She denies any trouble with diarrhea or constipation.  No dysuria but her urine does look to be darker in color.  Patient denies any history of prior abdominal problems.  No history of prior abdominal surgeries.  Past Medical History:  Diagnosis Date  . AICD (automatic cardioverter/defibrillator) present 10/08/2014   SUBQ    /    DR [name]  . CHF (congestive heart failure) ([place])   . Diabetes mellitus without complication ([place])   . History of cardiac cath 05/2007   normal-with patent coronaries  . History of colonoscopy   . History of hiatal hernia   . Hypertension   . Iatrogenic thyroiditis   . Non-ischemic cardiomyopathy ([place])    EF 28%- reassessment of LV function 2011 with LVEf 45-50%  . PAD (peripheral artery disease) ([facility])    lower extremities with ABIs of 0.5 bilaterally  . Personal history of goiter   . S/P radioactive iodine thyroid ablation   . S/P thyroidectomy   . Shortness of breath dyspnea     Patient Active Problem List   Diagnosis Date Noted  . Acute on chronic combined systolic and diastolic CHF (congestive heart failure) ([place]) 03/13/2018  . Non-ischemic cardiomyopathy ([place])   . Hypertension   . Diabetes mellitus without complication ([place])    . CAD (coronary artery disease) 06/18/2017  . PAD (peripheral artery disease) ([place]) 06/18/2017  . ICD (implantable cardioverter-defibrillator) in place 06/18/2017  . Chronic combined systolic and diastolic CHF (congestive heart failure) ([place]) 05/22/2017  . Hyperlipidemia associated with type 2 diabetes mellitus ([place]) 12/11/2016  . Treatment-emergent central sleep apnea 09/20/2016  . NICM (nonischemic cardiomyopathy) ([place]) 10/08/2014  . AICD (automatic cardioverter/defibrillator) present 10/08/2014  . Cigarette smoker 10/30/2012  . 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 DeQuervain's disease (tenosynovitis) 09/01/2010  . Atherosclerosis 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 ([place]) 12/07/2008  . Diabetes ([place]) 10/29/2008  . Hypothyroidism 01/31/2007  . Essential hypertension 01/31/2007  . GERD 01/31/2007    Past Surgical History:  Procedure Laterality Date  . EP IMPLANTABLE DEVICE N/A 10/08/2014   Procedure: SubQ ICD Implant;  Surgeon: [name], MD;  Location: [facility];  Service: Cardiovascular;  Laterality: N/A;  . THYROIDECTOMY    . TONSILLECTOMY    . TOTAL ABDOMINAL HYSTERECTOMY       OB History   No obstetric history on file.     Family History  Problem Relation Age of Onset  . Diabetes type II Mother   . Hypertension Mother   . Diabetes Mother   . Heart disease Mother   . Other Father        deceased from accident  age 50  . Hyperlipidemia Father   . Breast cancer Neg Hx     Social History   Tobacco Use  . Smoking status: Former Smoker    Packs/day: 0.25    Years: 40.00    Pack years: 10.00    Types: Cigarettes    Quit date: 07/03/2018    Years since quitting: 1.0  . Smokeless tobacco: Never Used  . Tobacco comment: up to 1 a day  Substance Use Topics  . Alcohol use: Yes    Alcohol/week: 0.0 standard drinks    Comment: rare glass of wine  . Drug use: No    Home Medications Prior to Admission medications     Medication Sig Start Date End Date Taking? Authorizing Provider  allopurinol (ZYLOPRIM) 100 MG tablet Take 100 mg by mouth daily. 03/31/19  Yes [provider]  aspirin 81 MG tablet Take 81 mg by mouth daily.     Yes [provider]  atorvastatin (LIPITOR) 40 MG tablet TAKE 1 AND 1/2 TABLETS(60 MG) BY MOUTH DAILY Patient taking differently: Take 50 mg by mouth daily.  06/06/19  Yes [name], MD  carvedilol (COREG) 25 MG tablet TAKE 1 TABLET BY MOUTH TWICE A DAY WITH FOOD Patient taking differently: Take 25 mg by mouth 2 (two) times daily with a meal.  09/11/18  Yes [name], MD  furosemide (LASIX) 40 MG tablet TAKE 1 TABLET BY MOUTH TWICE DAILY Patient taking differently: Take 40 mg by mouth 2 (two) times daily.  08/27/18  Yes [name], MD  glipiZIDE (GLUCOTROL XL) 2.5 MG 24 hr tablet Take 1 tablet (2.5 mg total) by mouth daily with breakfast. 03/14/19  Yes [name], MD  glucose blood (ONETOUCH VERIO) test strip Use as instructed to check blood sugar once a day Patient taking differently: 1 each by Other route See admin instructions. Use as instructed to check blood sugar once a day 01/15/19  Yes [name], MD  hydroxychloroquine (PLAQUENIL) 200 MG tablet Take 1 tablet by mouth 2 (two) times daily. 11/01/17  Yes [provider]  isosorbide mononitrate (IMDUR) 30 MG 24 hr tablet TAKE 1 TABLET(30 MG) BY MOUTH DAILY Patient taking differently: Take 30 mg by mouth daily.  05/14/19  Yes [name], MD  levothyroxine (SYNTHROID) 150 MCG tablet TAKE 1 TABLET(150 MCG) BY MOUTH DAILY Patient taking differently: Take 150 mcg by mouth daily.  03/17/19  Yes [name], MD  omeprazole (PRILOSEC) 20 MG capsule Take 1 capsule (20 mg total) by mouth daily. 07/04/19  Yes Faustino Congress, NP  ondansetron (ZOFRAN) 4 MG tablet Take 1 tablet (4 mg total) by mouth every 6 (six) hours. 07/04/19  Yes Faustino Congress, NP  OneTouch Delica Lancets 10F  MISC 1 Device by Other route daily. 01/15/19  Yes [name], MD  predniSONE (DELTASONE) 10 MG tablet Take 1 tablet by mouth daily as needed (Pain.).  11/01/17  Yes [provider]  sacubitril-valsartan (ENTRESTO) 49-51 MG Take 1 tablet by mouth 2 (two) times daily. 06/12/18  Yes [name], MD  spironolactone (ALDACTONE) 50 MG tablet TAKE 1 TABLET(50 MG) BY MOUTH DAILY Patient taking differently: Take 50 mg by mouth daily.  03/10/19  Yes [name], MD  acetaminophen (TYLENOL) 500 MG tablet Take 1 tablet (500 mg total) by mouth every 6 (six) hours as needed. Patient taking differently: Take 500 mg by mouth every 6 (six) hours as needed for mild pain.  09/29/18   Zigmund Gottron, NP  cephALEXin (KEFLEX) 250 MG capsule Take 1 capsule (250 mg total) by mouth 4 (four) times daily. 07/05/19   Dorie Rank, MD    Allergies    Ibuprofen  Review of Systems   Review of Systems  All other systems reviewed and are negative.   Physical Exam Updated Vital Signs BP 128/72   Pulse 79   Temp 99.2 F (37.3 C) (Oral)   Resp 17   Ht 1.791 m (5' 10.5")   Wt 97.5 kg   SpO2 98%   BMI 30.41 kg/m   Physical Exam Vitals and nursing note reviewed.  Constitutional:      General: She is not in acute distress.    Appearance: She is well-developed.  HENT:     Head: Normocephalic and atraumatic.     Right Ear: External ear normal.     Left Ear: External ear normal.  Eyes:     General: No scleral icterus.       Right eye: No discharge.        Left eye: No discharge.     Conjunctiva/sclera: Conjunctivae normal.  Neck:     Trachea: No tracheal deviation.  Cardiovascular:     Rate and Rhythm: Normal rate and regular rhythm.  Pulmonary:     Effort: Pulmonary effort is normal. No respiratory distress.     Breath sounds: Normal breath sounds. No stridor. No wheezing or rales.  Abdominal:     General: Bowel sounds are normal. There is no distension.     Palpations: Abdomen is soft.       Tenderness: There is abdominal tenderness in the suprapubic area and left lower quadrant. There is no guarding or rebound.     Hernia: No hernia is present.  Musculoskeletal:        General: No tenderness.     Cervical back: Neck supple.  Skin:    General: Skin is warm and dry.     Findings: No rash.  Neurological:     Mental Status: She is alert.     Cranial Nerves: No cranial nerve deficit (no facial droop, extraocular movements intact, no slurred speech).     Sensory: No sensory deficit.     Motor: No abnormal muscle tone or seizure activity.     Coordination: Coordination normal.     ED Results / Procedures / Treatments   Labs (all labs ordered are listed, but only abnormal results are displayed) Labs Reviewed  COMPREHENSIVE METABOLIC PANEL - Abnormal; Notable for the following components:      Result Value   Glucose, Bld 164 (*)    Creatinine, Ser 1.73 (*)    Albumin 2.9 (*)    Total Bilirubin 1.9 (*)    GFR calc non Af Amer 28 (*)    GFR calc Af Amer 33 (*)    All other components within normal limits  CBC - Abnormal; Notable for the following components:   WBC 17.6 (*)    All other components within normal limits  URINALYSIS, ROUTINE W REFLEX MICROSCOPIC - Abnormal; Notable for the following components:   Color, Urine AMBER (*)    APPearance CLOUDY (*)    Hgb urine dipstick SMALL (*)    Protein, ur >=300 (*)    Leukocytes,Ua SMALL (*)    WBC, UA >50 (*)    Bacteria, UA MANY (*)    Non Squamous Epithelial 0-5 (*)    All other components within normal  limits  TROPONIN I (HIGH SENSITIVITY) - Abnormal; Notable for the following components:   Troponin I (High Sensitivity) 31 (*)    All other components within normal limits  TROPONIN I (HIGH SENSITIVITY) - Abnormal; Notable for the following components:   Troponin I (High Sensitivity) 28 (*)    All other components within normal limits  URINE CULTURE  LIPASE, BLOOD    EKG EKG  Interpretation  Date/Time:  Saturday July 05 2019 10:21:16 EDT Ventricular Rate:  91 PR Interval:  202 QRS Duration: 98 QT Interval:  348 QTC Calculation: 428 R Axis:   43 Text Interpretation: Normal sinus rhythm Nonspecific T wave abnormality Abnormal ECG Artifact No significant change since last tracing Confirmed by Dorie Rank [phone]) on 07/05/2019 11:02:53 AM   Radiology DG Chest 2 View  Result Date: 07/04/2019 CLINICAL DATA:  Shortness of breath.  Defibrillator placement EXAM: CHEST - 2 VIEW COMPARISON:  March 13, 2018 FINDINGS: There is slight scarring in each lower lung region. There is no evident edema or consolidation. Heart is enlarged with pulmonary vascularity within normal limits, stable. Defibrillator present anteriorly slightly to the left of midline, stable. No pneumothorax. No bone lesions. No adenopathy. IMPRESSION: Stable cardiac prominence. Stable defibrillator lead placement. Areas of scarring in each lower lobe. No edema or consolidation. Electronically Signed   By: Lowella Grip III M.D.   On: 07/04/2019 09:54   CT ABDOMEN PELVIS W CONTRAST  Result Date: 07/05/2019 CLINICAL DATA:  Suspected diverticulitis. Epigastric pain. EXAM: CT ABDOMEN AND PELVIS WITH CONTRAST TECHNIQUE: Multidetector CT imaging of the abdomen and pelvis was performed using the standard protocol following bolus administration of intravenous contrast. CONTRAST:  50mL OMNIPAQUE IOHEXOL 300 MG/ML  SOLN COMPARISON:  None. FINDINGS: Lower chest: Scarring versus atelectasis in the bilateral lower lobes. Cylindrical bronchiectasis in the visualized bronchi. Hepatobiliary: Mild hepatic steatosis. Numerous circumscribed hepatic masses measuring fluid density. Some less than 1 cm hepatic lesions too small to be actually characterize by CT. Normal appearance of the gallbladder. Pancreas: Unremarkable. No pancreatic ductal dilatation or surrounding inflammatory changes. Spleen: Normal in size without focal  abnormality. Adrenals/Urinary Tract: Adrenal glands are unremarkable. Kidneys are normal, without renal calculi, solid lesion, or hydronephrosis. Bilateral renal cysts with a large 4.4 cm cyst in the upper pole of the right kidney and 3.9 renal cyst in the lower pole of the left kidney. 2 cm fat containing partially exophytic mass off of the medial lower cortex of the left kidney. Bladder is unremarkable. Stomach/Bowel: Stomach is within normal limits. Appendix appears normal. No evidence of small bowel wall thickening, distention, or inflammatory changes. Extensive diverticulosis of the sigmoid and less so descending colon with chronic inflammatory changes. No evidence of acute diverticulitis. Vascular/Lymphatic: Aortic atherosclerosis. No enlarged abdominal or pelvic lymph nodes. Reproductive: Status post hysterectomy. No adnexal masses. Other: No abdominal wall hernia or abnormality. No abdominopelvic ascites. Musculoskeletal: L5-S1 spondylosis. IMPRESSION: 1. Mild hepatic steatosis. 2. Numerous liver cysts. Some less than 1 cm hepatic lesions too small to be actually characterize by CT. 3. Bilateral renal cysts. 4. 2 cm fat containing left renal mass. This may represent an angiomyolipoma. 5. Extensive diverticulosis of the sigmoid and less so descending colon with chronic inflammatory changes. No evidence of acute diverticulitis. 6. Cylindrical bronchiectasis and scarring in the visualized lower lobes of the lungs. Aortic Atherosclerosis (ICD10-I70.0). Electronically Signed   By: Fidela Salisbury M.D.   On: 07/05/2019 13:34    Procedures Procedures (including critical care time)  Medications Ordered  in ED Medications  sodium chloride flush (NS) 0.9 % injection 3 mL (has no administration in time range)  sodium chloride 0.9 % bolus 500 mL (0 mLs Intravenous Stopped 07/05/19 1318)    Followed by  0.9 %  sodium chloride infusion (1,000 mLs Intravenous New Bag/Given 07/05/19 1318)  morphine 4 MG/ML  injection 4 mg (4 mg Intravenous Given 07/05/19 1122)  ondansetron (ZOFRAN) injection 4 mg (4 mg Intravenous Given 07/05/19 1123)  cefTRIAXone (ROCEPHIN) 1 g in sodium chloride 0.9 % 100 mL IVPB (0 g Intravenous Stopped 07/05/19 1318)  iohexol (OMNIPAQUE) 300 MG/ML solution 75 mL (75 mLs Intravenous Contrast Given 07/05/19 1254)    ED Course  I have reviewed the triage vital signs and the nursing notes.  Pertinent labs & imaging results that were available during my care of the patient were reviewed by me and considered in my medical decision making (see chart for details).  Prior records reviewed.  Clinical Course as of Jul 04 1505  Sat Jul 05, 2019  1215 Laboratory tests reviewed. Leukocytosis noted.   [JK]  1215 Creatinine elevated compared to previous. Urinalysis suggests UTI   [JK]  1215 Troponin ordered per protocol although symptoms are not suggestive of acute coronary syndrome. Troponin is elevated. No priors for comparison   [JK]  1418 Repeat troponin normal.   [JK]  1444 Troponin is elevated but unchanged   [JK]    Clinical Course User Index [JK] Dorie Rank, MD   MDM Rules/Calculators/A&P                      Sx started eating.  Cardiac etiology unlikely.  ?pancreatitis, cholecystitis.  Pain more in lower abd however, ?diverticulitis.  Labs pending  Patient's ED work-up is notable for leukocytosis and a urinary tract infection.  With her lower abdominal tenderness patient had a CT scan.  No acute findings noted on the CT scan.  Specifically no diverticulitis.  Patient's laboratory tests are notable for an elevated troponin.  This was ordered per triage protocol.  Patient is not having any chest pain.  I do not feel that her symptoms are related to any acute cardiac etiology.  Patient does have chronic kidney disease and history of nonischemic cardiomyopathy.  I suspect this troponin is a chronic elevation for her.  Her delta troponin is unchanged.  Plan on discharge home with  antibiotics for urinary tract infection.  No signs of sepsis.  Recommend patient return to the ER for fever worsening symptoms.  Patient is feeling better and would like to go home.  Final Clinical Impression(s) / ED Diagnoses Final diagnoses:  Acute cystitis without hematuria  Elevated troponin    Rx / DC Orders ED Discharge Orders         Ordered    cephALEXin (KEFLEX) 250 MG capsule  4 times daily     07/05/19 1505           Dorie Rank, MD 07/05/19 1507

## 2019-07-05 NOTE — Discharge Instructions (Addendum)
Take the antibiotics as prescribed, follow-up with your primary care doctor next week to make sure you are improving, return to the ED for worsening symptoms, including fever, chest pain or other concerns

## 2019-07-05 NOTE — ED Triage Notes (Signed)
Pt started vomiting after eating a fruit cup on Monday.  Reports epigastric pain since then.  Today pain is radiating to back with SOB.  States she hasn't vomited any more since Monday but hasn't ate much all week.

## 2019-07-05 NOTE — ED Notes (Signed)
Patient verbalizes understanding of discharge instructions. Opportunity for questioning and answers were provided. Armband removed by staff, pt discharged from ED ambulatory.   

## 2019-07-07 LAB — URINE CULTURE: Culture: 60000 — AB

## 2019-07-08 ENCOUNTER — Encounter: Payer: Self-pay | Admitting: Internal Medicine

## 2019-07-08 ENCOUNTER — Encounter: Payer: Self-pay | Admitting: Family Medicine

## 2019-07-08 ENCOUNTER — Telehealth: Payer: Self-pay

## 2019-07-08 NOTE — Progress Notes (Unsigned)
Called to review her ICD having encroached upon premature battery depletion.  Unfortunately got her voicemail and did not leave a message

## 2019-07-08 NOTE — Telephone Encounter (Signed)
No abx needed for [facility] ED 07/05/19 per Pati Gallo PA

## 2019-07-09 NOTE — Telephone Encounter (Signed)
Pt has an in office appointment scheduled today at 11.30 am with Dr Elease Hashimoto

## 2019-07-09 NOTE — Telephone Encounter (Signed)
FYI

## 2019-07-14 ENCOUNTER — Other Ambulatory Visit: Payer: Self-pay | Admitting: Family Medicine

## 2019-07-14 ENCOUNTER — Ambulatory Visit (INDEPENDENT_AMBULATORY_CARE_PROVIDER_SITE_OTHER): Payer: Medicare PPO | Admitting: Family Medicine

## 2019-07-14 ENCOUNTER — Encounter: Payer: Self-pay | Admitting: Family Medicine

## 2019-07-14 ENCOUNTER — Other Ambulatory Visit: Payer: Self-pay

## 2019-07-14 VITALS — BP 128/84 | HR 84 | Temp 97.6°F | Wt 229.0 lb

## 2019-07-14 DIAGNOSIS — N1832 Chronic kidney disease, stage 3b: Secondary | ICD-10-CM | POA: Diagnosis not present

## 2019-07-14 DIAGNOSIS — R631 Polydipsia: Secondary | ICD-10-CM | POA: Diagnosis not present

## 2019-07-14 DIAGNOSIS — K7689 Other specified diseases of liver: Secondary | ICD-10-CM | POA: Diagnosis not present

## 2019-07-14 DIAGNOSIS — K5790 Diverticulosis of intestine, part unspecified, without perforation or abscess without bleeding: Secondary | ICD-10-CM | POA: Diagnosis not present

## 2019-07-14 DIAGNOSIS — E039 Hypothyroidism, unspecified: Secondary | ICD-10-CM

## 2019-07-14 DIAGNOSIS — F17210 Nicotine dependence, cigarettes, uncomplicated: Secondary | ICD-10-CM

## 2019-07-14 DIAGNOSIS — K760 Fatty (change of) liver, not elsewhere classified: Secondary | ICD-10-CM

## 2019-07-14 DIAGNOSIS — Q6102 Congenital multiple renal cysts: Secondary | ICD-10-CM | POA: Diagnosis not present

## 2019-07-14 DIAGNOSIS — E1142 Type 2 diabetes mellitus with diabetic polyneuropathy: Secondary | ICD-10-CM | POA: Diagnosis not present

## 2019-07-14 LAB — BASIC METABOLIC PANEL
BUN: 13 mg/dL (ref 6–23)
CO2: 29 mEq/L (ref 19–32)
Calcium: 8.9 mg/dL (ref 8.4–10.5)
Chloride: 102 mEq/L (ref 96–112)
Creatinine, Ser: 1.35 mg/dL — ABNORMAL HIGH (ref 0.40–1.20)
GFR: 46.14 mL/min — ABNORMAL LOW (ref >60.00)
Glucose, Bld: 190 mg/dL — ABNORMAL HIGH (ref 70–99)
Potassium: 4.5 mEq/L (ref 3.5–5.1)
Sodium: 139 mEq/L (ref 135–145)

## 2019-07-14 LAB — POCT GLUCOSE (DEVICE FOR HOME USE): Glucose Fasting, POC: 199 mg/dL — AB (ref 70–99)

## 2019-07-14 LAB — POCT GLYCOSYLATED HEMOGLOBIN (HGB A1C): Hemoglobin A1C: 7.2 % — AB (ref 4.0–5.6)

## 2019-07-14 LAB — TSH: TSH: 48.16 u[IU]/mL — ABNORMAL HIGH (ref 0.35–4.50)

## 2019-07-14 LAB — CBC
HCT: 40.5 % (ref 36.0–46.0)
Hemoglobin: 13 g/dL (ref 12.0–15.0)
MCHC: 32.2 g/dL (ref 30.0–36.0)
MCV: 97 fl (ref 78.0–100.0)
Platelets: 488 10*3/uL — ABNORMAL HIGH (ref 150.0–400.0)
RBC: 4.17 Mil/uL (ref 3.87–5.11)
RDW: 14.4 % (ref 11.5–15.5)
WBC: 9 10*3/uL (ref 4.0–10.5)

## 2019-07-14 LAB — POC URINALSYSI DIPSTICK (AUTOMATED)
Bilirubin, UA: NEGATIVE
Blood, UA: NEGATIVE
Glucose, UA: NEGATIVE
Ketones, UA: NEGATIVE
Leukocytes, UA: NEGATIVE
Nitrite, UA: NEGATIVE
Protein, UA: POSITIVE — AB
Spec Grav, UA: 1.025 (ref 1.010–1.025)
Urobilinogen, UA: 0.2 E.U./dL
pH, UA: 6 (ref 5.0–8.0)

## 2019-07-14 MED ORDER — LEVOTHYROXINE SODIUM 175 MCG PO TABS: 175.0000 ug | ORAL_TABLET | Freq: Every day | ORAL | 2 refills | Status: DC

## 2019-07-14 NOTE — Patient Instructions (Addendum)
He should work to decrease the amount of sweets and carbohydrates you are eating to improve your blood sugar. Several referrals have been placed in including gastroenterology, nephrology, and podiatry.  You should receive calls about scheduling these appointments.   Coping with Quitting Smoking  Quitting smoking is a physical and mental challenge. You will face cravings, withdrawal symptoms, and temptation. Before quitting, work with your health care provider to make a plan that can help you cope. Preparation can help you quit and keep you from giving in. How can I cope with cravings? Cravings usually last for 5-10 minutes. If you get through it, the craving will pass. Consider taking the following actions to help you cope with cravings:  Keep your mouth busy: ? Chew sugar-free gum. ? Suck on hard candies or a straw. ? Brush your teeth.  Keep your hands and body busy: ? Immediately change to a different activity when you feel a craving. ? Squeeze or play with a ball. ? Do an activity or a hobby, like making bead jewelry, practicing needlepoint, or working with wood. ? Mix up your normal routine. ? Take a short exercise break. Go for a quick walk or run up and down stairs. ? Spend time in public places where smoking is not allowed.  Focus on doing something kind or helpful for someone else.  Call a friend or family member to talk during a craving.  Join a support group.  Call a quit line, such as 1-800-QUIT-NOW.  Talk with your health care provider about medicines that might help you cope with cravings and make quitting easier for you. How can I deal with withdrawal symptoms? Your body may experience negative effects as it tries to get used to not having nicotine in the system. These effects are called withdrawal symptoms. They may include:  Feeling hungrier than normal.  Trouble concentrating.  Irritability.  Trouble sleeping.  Feeling depressed.  Restlessness and  agitation.  Craving a cigarette. To manage withdrawal symptoms:  Avoid places, people, and activities that trigger your cravings.  Remember why you want to quit.  Get plenty of sleep.  Avoid coffee and other caffeinated drinks. These may worsen some of your symptoms. How can I handle social situations? Social situations can be difficult when you are quitting smoking, especially in the first few weeks. To manage this, you can:  Avoid parties, bars, and other social situations where people might be smoking.  Avoid alcohol.  Leave right away if you have the urge to smoke.  Explain to your family and friends that you are quitting smoking. Ask for understanding and support.  Plan activities with friends or family where smoking is not an option. What are some ways I can cope with stress? Wanting to smoke may cause stress, and stress can make you want to smoke. Find ways to manage your stress. Relaxation techniques can help. For example:  Breathe slowly and deeply, in through your nose and out through your mouth.  Listen to soothing, relaxing music.  Talk with a family member or friend about your stress.  Light a candle.  Soak in a bath or take a shower.  Think about a peaceful place. What are some ways I can prevent weight gain? Be aware that many people gain weight after they quit smoking. However, not everyone does. To keep from gaining weight, have a plan in place before you quit and stick to the plan after you quit. Your plan should include:  Having healthy snacks.  When you have a craving, it may help to: ? Eat plain popcorn, crunchy carrots, celery, or other cut vegetables. ? Chew sugar-free gum.  Changing how you eat: ? Eat small portion sizes at meals. ? Eat 4-6 small meals throughout the day instead of 1-2 large meals a day. ? Be mindful when you eat. Do not watch television or do other things that might distract you as you eat.  Exercising regularly: ? Make time  to exercise each day. If you do not have time for a long workout, do short bouts of exercise for 5-10 minutes several times a day. ? Do some form of strengthening exercise, like weight lifting, and some form of aerobic exercise, like running or swimming.  Drinking plenty of water or other low-calorie or no-calorie drinks. Drink 6-8 glasses of water daily, or as much as instructed by your health care provider. Summary  Quitting smoking is a physical and mental challenge. You will face cravings, withdrawal symptoms, and temptation to smoke again. Preparation can help you as you go through these challenges.  You can cope with cravings by keeping your mouth busy (such as by chewing gum), keeping your body and hands busy, and making calls to family, friends, or a helpline for people who want to quit smoking.  You can cope with withdrawal symptoms by avoiding places where people smoke, avoiding drinks with caffeine, and getting plenty of rest.  Ask your health care provider about the different ways to prevent weight gain, avoid stress, and handle social situations. This information is not intended to replace advice given to you by your health care provider. Make sure you discuss any questions you have with your health care provider. Document Revised: 03/02/2017 Document Reviewed: 03/17/2016 Elsevier Patient Education  2020 [employer].  Renal Mass  A renal mass is a growth in the kidney. A renal mass may be found while performing an MRI, CT scan, or ultrasound for other problems of the abdomen. Certain types of cancers, infections, or injuries can cause a renal mass. A renal mass that is cancerous (malignant) may grow or spread quickly. Others are harmless (benign). What are common types of renal masses? Renal masses include:  Tumors. These may be cancerous (malignant) or noncancerous (benign). ? The most common type of kidney cancer is renal cell carcinoma. ? The most common benign tumors of  the kidney include renal adenomas, oncocytomas, and angiomyolipoma (AML).  Cysts. These are fluid-filled sacs that form on or in the kidney. ? It is not always known what causes a cyst to develop in or on the kidney. ? Most kidney cysts do not cause symptoms and do not need to be treated. What type of testing might I need? Your health care provider may recommend that you have tests to diagnose the cause of your renal mass. The following tests may be done if a renal mass is found:  Physical exam.  Blood tests.  Urine tests.  Imaging tests, such as ultrasound, CT scan, or MRI.  Biopsy. This is a small sample that is removed from the renal mass and tested in a lab. The exact tests and how often they are done will depend on:  The size and appearance of the renal mass.  Risk factors or medical conditions that increase your risk for problems.  Any symptoms associated with the renal mass, or concerns that you have about it. Tests and physical exams may be done once, or they may be done regularly for a period  of time. Tests and exams that are done regularly will help monitor whether the mass is growing and beginning to cause problems. What are common treatments for renal masses? Treatment is not always needed for this condition. Your health care provider may recommend careful monitoring (watchful waiting) and regular tests and exams. Treatment will depend on the cause of the mass. Follow these instructions at home: What you need to do at home will depend on the cause of the mass. Follow the instructions that your health care provider gives to you. In general:  Take over-the-counter and prescription medicines only as told by your health care provider.  If you are prescribed an antibiotic medicine, take it as told by your health care provider. Do not stop taking the antibiotic even if you start to feel better.  Follow any restrictions that are given to you by your health care  provider.  Keep all follow-up visits as told by your health care provider. This is important. ? You may need to see your health care provider once or twice a year to have CT scans and ultrasounds done. These tests will show if your renal mass has changed or grown bigger. Contact a health care provider if you:  Have pain in the side or back (flank pain).  Have a fever.  Feel full soon after eating.  Have pain or swelling in the abdomen.  Lose weight. Get help right away if:  Your pain gets worse.  There is blood in your urine.  You cannot urinate.  You have chest pain.  You have trouble breathing. Summary  A renal mass is a growth in the kidney. It may be cancerous (malignant) and grow or spread quickly, or it may be harmless (benign).  Renal masses may be found while performing an MRI, CT scan, or ultrasound for other problems of the abdomen.  Your health care provider may recommend that you have tests to diagnose the cause of your renal mass. This may include a physical exam, blood tests, urine tests, imaging, or a biopsy.  Treatment is not always needed for this condition. Careful monitoring (watchful waiting) may be recommended. This information is not intended to replace advice given to you by your health care provider. Make sure you discuss any questions you have with your health care provider. Document Revised: 04/26/2017 Document Reviewed: 04/26/2017 Elsevier Patient Education  2020 [place] for Chronic Kidney Disease When your kidneys are not working well, they cannot remove waste and excess substances from your blood as effectively as they did before. This can lead to a buildup and imbalance of these substances, which can worsen kidney damage and affect how your body functions. Certain foods lead to a buildup of these substances in the body. By changing your diet as recommended by your diet and nutrition specialist (dietitian) or health care  provider, you could help prevent further kidney damage and delay or prevent the need for dialysis. What are tips for following this plan? General instructions   Work with your health care provider and dietitian to develop a meal plan that is right for you. Foods you can eat, limit, or avoid will be different for each person depending on the stage of kidney disease and any other existing health conditions.  Talk with your health care provider about whether you should take a vitamin and mineral supplement.  Use standard measuring cups and spoons to measure servings of foods. Use a kitchen scale to measure portions of protein  foods.  If directed by your health care provider, avoid drinking too much fluid. Measure and count all liquids, including water, ice, soups, flavored gelatin, and frozen desserts such as popsicles or ice cream. Reading food labels  Check the amount of sodium in foods. Choose foods that have less than 300 milligrams (mg) per serving.  Check the ingredient list for phosphorus or potassium-based additives or preservatives.  Check the amount of saturated and trans fat. Limit or avoid these fats as told by your dietitian. Shopping  Avoid buying foods that are: ? Processed, frozen, or prepackaged. ? Calcium-enriched or fortified.  Do not buy foods that have salt or sodium listed among the first five ingredients.  Do not buy canned vegetables. Cooking  Replace animal proteins, such as meat, fish, eggs, or dairy, with plant proteins from beans, nuts, and soy. ? Use soy milk instead of cow's milk. ? Add beans or tofu to soups, casseroles, or pasta dishes instead of meat.  Soak vegetables, such as potatoes, before cooking to reduce potassium. To do this: ? Peel and cut into small pieces. ? Soak in warm water for at least 2 hours. For every 1 cup of vegetables, use 10 cups of water. ? Drain and rinse with warm water. ? Boil for at least 5 minutes. Meal planning  Limit  the amount of protein from plant and animal sources you eat each day.  Do not add salt to food when cooking or before eating.  Eat meals and snacks at around the same time each day. If you have diabetes:  If you have diabetes (diabetes mellitus) and chronic kidney disease, it is important to keep your blood glucose in the target range recommended by your health care provider. Follow your diabetes management plan. This may include: ? Checking your blood glucose regularly. ? Taking oral medicines, insulin, or both. ? Exercising for at least 30 minutes on 5 or more days each week, or as told by your health care provider. ? Tracking how many servings of carbohydrates you eat at each meal.  You may be given specific guidelines on how much of certain foods and nutrients you may eat, depending on your stage of kidney disease and whether you have high blood pressure (hypertension). Follow your meal plan as told by your dietitian. What nutrients should be limited? The items listed are not a complete list. Talk with your dietitian about what dietary choices are best for you. Potassium Potassium affects how steadily your heart beats. If too much potassium builds up in your blood, it can cause an irregular heartbeat or even a heart attack. You may need to eat less potassium, depending on your blood potassium levels and the stage of kidney disease. Talk to your dietitian about how much potassium you may have each day. You may need to limit or avoid foods that are high in potassium, such as:  Milk and soy milk.  Fruits, such as bananas, papaya, apricots, nectarines, melon, prunes, raisins, kiwi, and oranges.  Vegetables, such as potatoes, sweet potatoes, yams, tomatoes, leafy greens, beets, okra, avocado, pumpkin, and winter squash.  White and lima beans. Phosphorus Phosphorus is a mineral found in your bones. A balance between calcium and phosphorous is needed to build and maintain healthy bones. Too  much phosphorus pulls calcium from your bones. This can make your bones weak and more likely to break. Too much phosphorus can also make your skin itch. You may need to eat less phosphorus depending on your blood  phosphorus levels and the stage of kidney disease. Talk to your dietitian about how much potassium you may have each day. You may need to take medicine to lower your blood phosphorus levels if diet changes do not help. You may need to limit or avoid foods that are high in phosphorus, such as:  Milk and dairy products.  Dried beans and peas.  Tofu, soy milk, and other soy-based meat replacements.  Colas.  Nuts and peanut butter.  Meat, poultry, and fish.  Bran cereals and oatmeals. Protein Protein helps you to make and keep muscle. It also helps in the repair of your body's cells and tissues. One of the natural breakdown products of protein is a waste product called urea. When your kidneys are not working properly, they cannot remove wastes, such as urea, like they did before you developed chronic kidney disease. Reducing how much protein you eat can help prevent a buildup of urea in your blood. Depending on your stage of kidney disease, you may need to limit foods that are high in protein. Sources of animal protein include:  Meat (all types).  Fish and seafood.  Poultry.  Eggs.  Dairy. Other protein foods include:  Beans and legumes.  Nuts and nut butter.  Soy and tofu. Sodium Sodium, which is found in salt, helps maintain a healthy balance of fluids in your body. Too much sodium can increase your blood pressure and have a negative effect on the function of your heart and lungs. Too much sodium can also cause your body to retain too much fluid, making your kidneys work harder. Most people should have less than 2,300 milligrams (mg) of sodium each day. If you have hypertension, you may need to limit your sodium to 1,500 mg each day. Talk to your dietitian about how much  sodium you may have each day. You may need to limit or avoid foods that are high in sodium, such as:  Salt seasonings.  Soy sauce.  Cured and processed meats.  Salted crackers and snack foods.  Fast food.  Canned soups and most canned foods.  Pickled foods.  Vegetable juice.  Boxed mixes or ready-to-eat boxed meals and side dishes.  Bottled dressings, sauces, and marinades. Summary  Chronic kidney disease can lead to a buildup and imbalance of waste and excess substances in the body. Certain foods lead to a buildup of these substances. By adjusting your intake of these foods, you could help prevent more kidney damage and delay or prevent the need for dialysis.  Food adjustments are different for each person with chronic kidney disease. Work with a dietitian to set up nutrient goals and a meal plan that is right for you.  If you have diabetes and chronic kidney disease, it is important to keep your blood glucose in the target range recommended by your health care provider. This information is not intended to replace advice given to you by your health care provider. Make sure you discuss any questions you have with your health care provider. Document Revised: 07/11/2018 Document Reviewed: 03/15/2016 Elsevier Patient Education  [place].  Diabetes Mellitus and Nutrition, Adult When you have diabetes (diabetes mellitus), it is very important to have healthy eating habits because your blood sugar (glucose) levels are greatly affected by what you eat and drink. Eating healthy foods in the appropriate amounts, at about the same times every day, can help you:  Control your blood glucose.  Lower your risk of heart disease.  Improve your blood  pressure.  Reach or maintain a healthy weight. Every person with diabetes is different, and each person has different needs for a meal plan. Your health care provider may recommend that you work with a diet and nutrition specialist  (dietitian) to make a meal plan that is best for you. Your meal plan may vary depending on factors such as:  The calories you need.  The medicines you take.  Your weight.  Your blood glucose, blood pressure, and cholesterol levels.  Your activity level.  Other health conditions you have, such as heart or kidney disease. How do carbohydrates affect me? Carbohydrates, also called carbs, affect your blood glucose level more than any other type of food. Eating carbs naturally raises the amount of glucose in your blood. Carb counting is a method for keeping track of how many carbs you eat. Counting carbs is important to keep your blood glucose at a healthy level, especially if you use insulin or take certain oral diabetes medicines. It is important to know how many carbs you can safely have in each meal. This is different for every person. Your dietitian can help you calculate how many carbs you should have at each meal and for each snack. Foods that contain carbs include:  Bread, cereal, rice, pasta, and crackers.  Potatoes and corn.  Peas, beans, and lentils.  Milk and yogurt.  Fruit and juice.  Desserts, such as cakes, cookies, ice cream, and candy. How does alcohol affect me? Alcohol can cause a sudden decrease in blood glucose (hypoglycemia), especially if you use insulin or take certain oral diabetes medicines. Hypoglycemia can be a life-threatening condition. Symptoms of hypoglycemia (sleepiness, dizziness, and confusion) are similar to symptoms of having too much alcohol. If your health care provider says that alcohol is safe for you, follow these guidelines:  Limit alcohol intake to no more than 1 drink per day for nonpregnant women and 2 drinks per day for men. One drink equals 12 oz of beer, 5 oz of wine, or 1 oz of hard liquor.  Do not drink on an empty stomach.  Keep yourself hydrated with water, diet soda, or unsweetened iced tea.  Keep in mind that regular soda,  juice, and other mixers may contain a lot of sugar and must be counted as carbs. What are tips for following this plan?  Reading food labels  Start by checking the serving size on the "Nutrition Facts" label of packaged foods and drinks. The amount of calories, carbs, fats, and other nutrients listed on the label is based on one serving of the item. Many items contain more than one serving per package.  Check the total grams (g) of carbs in one serving. You can calculate the number of servings of carbs in one serving by dividing the total carbs by 15. For example, if a food has 30 g of total carbs, it would be equal to 2 servings of carbs.  Check the number of grams (g) of saturated and trans fats in one serving. Choose foods that have low or no amount of these fats.  Check the number of milligrams (mg) of salt (sodium) in one serving. Most people should limit total sodium intake to less than 2,300 mg per day.  Always check the nutrition information of foods labeled as "low-fat" or "nonfat". These foods may be higher in added sugar or refined carbs and should be avoided.  Talk to your dietitian to identify your daily goals for nutrients listed on the label. Shopping  Avoid buying canned, premade, or processed foods. These foods tend to be high in fat, sodium, and added sugar.  Shop around the outside edge of the grocery store. This includes fresh fruits and vegetables, bulk grains, fresh meats, and fresh dairy. Cooking  Use low-heat cooking methods, such as baking, instead of high-heat cooking methods like deep frying.  Cook using healthy oils, such as olive, canola, or sunflower oil.  Avoid cooking with butter, cream, or high-fat meats. Meal planning  Eat meals and snacks regularly, preferably at the same times every day. Avoid going long periods of time without eating.  Eat foods high in fiber, such as fresh fruits, vegetables, beans, and whole grains. Talk to your dietitian about  how many servings of carbs you can eat at each meal.  Eat 4-6 ounces (oz) of lean protein each day, such as lean meat, chicken, fish, eggs, or tofu. One oz of lean protein is equal to: ? 1 oz of meat, chicken, or fish. ? 1 egg. ?  cup of tofu.  Eat some foods each day that contain healthy fats, such as avocado, nuts, seeds, and fish. Lifestyle  Check your blood glucose regularly.  Exercise regularly as told by your health care provider. This may include: ? 150 minutes of moderate-intensity or vigorous-intensity exercise each week. This could be brisk walking, biking, or water aerobics. ? Stretching and doing strength exercises, such as yoga or weightlifting, at least 2 times a week.  Take medicines as told by your health care provider.  Do not use any products that contain nicotine or tobacco, such as cigarettes and e-cigarettes. If you need help quitting, ask your health care provider.  Work with a Social worker or diabetes educator to identify strategies to manage stress and any emotional and social challenges. Questions to ask a health care provider  Do I need to meet with a diabetes educator?  Do I need to meet with a dietitian?  What number can I call if I have questions?  When are the best times to check my blood glucose? Where to find more information:  American Diabetes Association: diabetes.org  Academy of Nutrition and Dietetics: www.eatright.[employer] of Diabetes and Digestive and Kidney Diseases (NIH): [employer] Summary  A healthy meal plan will help you control your blood glucose and maintain a healthy lifestyle.  Working with a diet and nutrition specialist (dietitian) can help you make a meal plan that is best for you.  Keep in mind that carbohydrates (carbs) and alcohol have immediate effects on your blood glucose levels. It is important to count carbs and to use alcohol carefully. This information is not intended to replace advice given  to you by your health care provider. Make sure you discuss any questions you have with your health care provider. Document Revised: 03/02/2017 Document Reviewed: 04/24/2016 Elsevier Patient Education  [place].  Diabetes Mellitus and [place] care is an important part of your health, especially when you have diabetes. Diabetes may cause you to have problems because of poor blood flow (circulation) to your feet and legs, which can cause your skin to:  Become thinner and drier.  Break more easily.  Heal more slowly.  Peel and crack. You may also have nerve damage (neuropathy) in your legs and feet, causing decreased feeling in them. This means that you may not notice minor injuries to your feet that could lead to more serious problems. Noticing and addressing any potential problems early is the best  way to prevent future foot problems. How to care for your feet Foot hygiene  Wash your feet daily with warm water and mild soap. Do not use hot water. Then, pat your feet and the areas between your toes until they are completely dry. Do not soak your feet as this can dry your skin.  Trim your toenails straight across. Do not dig under them or around the cuticle. File the edges of your nails with an emery board or nail file.  Apply a moisturizing lotion or petroleum jelly to the skin on your feet and to dry, brittle toenails. Use lotion that does not contain alcohol and is unscented. Do not apply lotion between your toes. Shoes and socks  Wear clean socks or stockings every day. Make sure they are not too tight. Do not wear knee-high stockings since they may decrease blood flow to your legs.  Wear shoes that fit properly and have enough cushioning. Always look in your shoes before you put them on to be sure there are no objects inside.  To break in new shoes, wear them for just a few hours a day. This prevents injuries on your feet. Wounds, scrapes, corns, and calluses  Check  your feet daily for blisters, cuts, bruises, sores, and redness. If you cannot see the bottom of your feet, use a mirror or ask someone for help.  Do not cut corns or calluses or try to remove them with medicine.  If you find a minor scrape, cut, or break in the skin on your feet, keep it and the skin around it clean and dry. You may clean these areas with mild soap and water. Do not clean the area with peroxide, alcohol, or iodine.  If you have a wound, scrape, corn, or callus on your foot, look at it several times a day to make sure it is healing and not infected. Check for: ? Redness, swelling, or pain. ? Fluid or blood. ? Warmth. ? Pus or a bad smell. General instructions  Do not cross your legs. This may decrease blood flow to your feet.  Do not use heating pads or hot water bottles on your feet. They may burn your skin. If you have lost feeling in your feet or legs, you may not know this is happening until it is too late.  Protect your feet from hot and cold by wearing shoes, such as at the beach or on hot pavement.  Schedule a complete foot exam at least once a year (annually) or more often if you have foot problems. If you have foot problems, report any cuts, sores, or bruises to your health care provider immediately. Contact a health care provider if:  You have a medical condition that increases your risk of infection and you have any cuts, sores, or bruises on your feet.  You have an injury that is not healing.  You have redness on your legs or feet.  You feel burning or tingling in your legs or feet.  You have pain or cramps in your legs and feet.  Your legs or feet are numb.  Your feet always feel cold.  You have pain around a toenail. Get help right away if:  You have a wound, scrape, corn, or callus on your foot and: ? You have pain, swelling, or redness that gets worse. ? You have fluid or blood coming from the wound, scrape, corn, or callus. ? Your wound,  scrape, corn, or callus feels warm to the  touch. ? You have pus or a bad smell coming from the wound, scrape, corn, or callus. ? You have a fever. ? You have a red line going up your leg. Summary  Check your feet every day for cuts, sores, red spots, swelling, and blisters.  Moisturize feet and legs daily.  Wear shoes that fit properly and have enough cushioning.  If you have foot problems, report any cuts, sores, or bruises to your health care provider immediately.  Schedule a complete foot exam at least once a year (annually) or more often if you have foot problems. This information is not intended to replace advice given to you by your health care provider. Make sure you discuss any questions you have with your health care provider. Document Revised: 12/11/2018 Document Reviewed: 04/21/2016 Elsevier Patient Education  [place].

## 2019-07-14 NOTE — Progress Notes (Signed)
Subjective:    Patient ID: Paula Stein, female    DOB: 05/05/1945, 74 y.o.   MRN: [id number]  No chief complaint on file.   HPI Pt is a 74 yo female with pmh sig for combined CHF, NICM, PAD, CAD, DM 2 with neuropathy, HTN, CKD 3, hypothyroidism who was seen for UC and ED follow-up.  Pt seen on 4/2 at [facility] for abdominal pain, nausea, and vomiting x 1.  Dx'd with gastritis given Prilosec and zofran.  In the ED on 4/3, pt dx'd with UTI started on Keflex 4 times daily.  Pt did not take the Keflex.  Elevated troponin noted but remained stable likely 2/2 renal and cardiac disease.  Pt irritated she was told not to worry about cysts seen on CT.    Since ED visit, pt states she has been doing better. No longer having abd pain, n/v, and appetite improving.  Pt notes increased intake of juices, fruits, and startches since living with her son and his family.  States they do not eat vegetables.  States bs has gone up, now 149.  Pt notes since being sick she has not smoked a cigarette.  She states the smell makes her sick.  Pt threw out all her cigarettes.    Had vision checked in Dec 2020.  Past Medical History:  Diagnosis Date  . AICD (automatic cardioverter/defibrillator) present 10/08/2014   SUBQ    /    DR [name]  . CHF (congestive heart failure) ([place])   . Diabetes mellitus without complication ([place])   . History of cardiac cath 05/2007   normal-with patent coronaries  . History of colonoscopy   . History of hiatal hernia   . Hypertension   . Iatrogenic thyroiditis   . Non-ischemic cardiomyopathy ([place])    EF 28%- reassessment of LV function 2011 with LVEf 45-50%  . PAD (peripheral artery disease) (HCC)    lower extremities with ABIs of 0.5 bilaterally  . Personal history of goiter   . S/P radioactive iodine thyroid ablation   . S/P thyroidectomy   . Shortness of breath dyspnea     Allergies  Allergen Reactions  . Ibuprofen Nausea Only    ROS General: Denies fever, chills, night sweats,  changes in weight + fatigue, increased thrist, apptetite improving HEENT: Denies headaches, ear pain, changes in vision, rhinorrhea, sore throat CV: Denies CP, palpitations, SOB, orthopnea Pulm: Denies SOB, cough, wheezing GI: Denies abdominal pain, nausea, vomiting, diarrhea, constipation  +flatus GU: Denies dysuria, hematuria, frequency, vaginal discharge Msk: Denies muscle cramps, joint pains Neuro: Denies weakness, numbness, tingling Skin: Denies rashes, bruising Psych: Denies depression, anxiety, hallucinations    Objective:    Blood pressure 128/84, pulse 84, temperature 97.6 F (36.4 C), temperature source Temporal, weight 229 lb (103.9 kg), SpO2 97 %.   Gen. Pleasant, well-nourished, in no distress, normal affect  HEENT: [place]/AT, face symmetric, conjunctiva clear, no scleral icterus, PERRLA, EOMI, nares patent without drainage, pharynx without erythema or exudate. Lungs: no accessory muscle use, CTAB, no wheezes or rales Cardiovascular: RRR, no m/r/g, no peripheral edema Abdomen: BS present, soft, NT/ND, no hepatosplenomegaly. Musculoskeletal: No deformities, no cyanosis or clubbing, normal tone Neuro:  A&Ox3, CN II-XII intact, normal gait Skin:  Warm, no lesions/ rash  Diabetic Foot Exam - Simple   Simple Foot Form Visual Inspection No deformities, no ulcerations, no other skin breakdown bilaterally: Yes Sensation Testing See comments: Yes Pulse Check Posterior Tibialis and Dorsalis pulse intact bilaterally: Yes Comments Decreased  sensation of       Wt Readings from Last 3 Encounters:  07/05/19 215 lb (97.5 kg)  07/04/19 215 lb (97.5 kg)  06/13/19 231 lb 12.8 oz (105.1 kg)    Lab Results  Component Value Date   WBC 17.6 (H) 07/05/2019   HGB 12.5 07/05/2019   HCT 40.7 07/05/2019   PLT 207 07/05/2019   GLUCOSE 164 (H) 07/05/2019   CHOL 228 (H) 02/06/2019   TRIG 165 (H) 02/06/2019   HDL 91 02/06/2019   LDLDIRECT 195.2 02/15/2011   LDLCALC 109 (H)  02/06/2019   ALT 18 07/05/2019   AST 17 07/05/2019   NA 137 07/05/2019   K 4.3 07/05/2019   CL 101 07/05/2019   CREATININE 1.73 (H) 07/05/2019   BUN 13 07/05/2019   CO2 23 07/05/2019   TSH 4.651 (H) 03/13/2018   INR 0.9 10/01/2014   HGBA1C 7.2 (H) 03/11/2019   MICROALBUR 33.5 (H) 06/02/2015    Assessment/Plan:  Cigarette nicotine dependence without complication -Smoking cessation counseling greater than 3 minutes, less than 10 minutes -Congratulated on smoking cessation -Discussed various ways to continue avoiding cigarettes including medication options.  Patient declines at this time -Given handouts -We will continue to monitor  Type 2 diabetes mellitus with diabetic polyneuropathy, without long-term current use of insulin (HCC) -Continue current medications including glipizide XL 2.5 mg -Discussed importance of lifestyle modifications.  Patient encouraged to decrease the amount of sweets and starches she is eating.  Increase vegetable intake -Continue checking FSBS regularly -Foot exam done this visit -We will send to podiatrist for toenail trimming. - Plan: Ambulatory referral to Podiatry, Ambulatory referral to Nephrology, CBC (no diff), POCT Glucose (Device for Home Use), POCT glycosylated hemoglobin (Hb A1C)  Hepatic steatosis  -noted on CT abd and pelvis 07/05/19 -LFTs normal -lifestyle modifications encouraged. 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 - Plan: Ambulatory referral to Nephrology  Hepatic cyst  -Noted on CT abdomen pelvis 07/05/2019.  Numerous liver cyst.  Some less than 1 cm hepatic lesions too small to be actually characterized by CT. - Plan: Ambulatory referral to Gastroenterology  Diverticulosis -Stable -Noted on CT abdomen pelvis 07/05/2019: Extensive diverticulosis of the  sigmoid and less so descending colon with chronic inflammatory changes.  No evidence of acute diverticulitis. -Continue to monitor - Plan: Ambulatory referral to Gastroenterology  Stage 3b chronic kidney disease -Creatinine elevated at 1.73 and GFR decreased at 33 on 07/05/2019.  Baseline creatinine 1.2-1.5 over the last yr. -Encouraged to increase p.o. intake of water and decrease intake of juice - Plan: Ambulatory referral to Nephrology, Basic metabolic panel  Increased thirst  -Hemoglobin A1c 7.2% -We will repeat UA as UCx from ED with 60,000 E. coli resistant to penicillins and cephalosporins. -Pt advised to discard Keflex from ED visit 4/3 -UA this visit with positive protein, SG 1.025 - Plan: POCT Urinalysis Dipstick (Automated)  Hypothyroidism, unspecified type  -no recent TSH -Continue Synthroid 150 mg daily - Plan: TSH  F/u prn in 1 month, sooner if needed  More than 50% of over 60 minutes spent in total face-to-face with the patient caring for the patient, reviewing the chart, counseling and/or coordinating care.    Grier Mitts, MD

## 2019-07-23 LAB — CUP PACEART REMOTE DEVICE CHECK
Battery Remaining Percentage: 12 %
Date Time Interrogation Session: 20210421085900
Implantable Lead Implant Date: 20160707
Implantable Lead Location: 753862
Implantable Lead Model: 3401
Implantable Pulse Generator Implant Date: 20160707
Pulse Gen Serial Number: 115852

## 2019-07-24 ENCOUNTER — Ambulatory Visit: Payer: Medicare PPO | Admitting: Physician Assistant

## 2019-07-24 ENCOUNTER — Encounter: Payer: Self-pay | Admitting: Physician Assistant

## 2019-07-24 VITALS — BP 138/82 | HR 78 | Temp 98.0°F | Ht 70.0 in | Wt 226.0 lb

## 2019-07-24 DIAGNOSIS — R142 Eructation: Secondary | ICD-10-CM | POA: Diagnosis not present

## 2019-07-24 DIAGNOSIS — Z8719 Personal history of other diseases of the digestive system: Secondary | ICD-10-CM | POA: Diagnosis not present

## 2019-07-24 MED ORDER — OMEPRAZOLE 40 MG PO CPDR: 40.0000 mg | DELAYED_RELEASE_CAPSULE | Freq: Two times a day (BID) | ORAL | 2 refills | Status: DC

## 2019-07-24 MED ORDER — OMEPRAZOLE 40 MG PO CPDR: 40.0000 mg | DELAYED_RELEASE_CAPSULE | Freq: Two times a day (BID) | ORAL | 3 refills | Status: DC

## 2019-07-24 NOTE — Progress Notes (Signed)
Agree with assessment and plan as outlined.  

## 2019-07-24 NOTE — Progress Notes (Signed)
Chief Complaint: Diverticulosis and eructations  HPI:    Paula Stein is a 74 year old African-American female with past medical history as listed below including CHF status post defibrillator, nonischemic cardiomyopathy, PAD and others, who was referred to me by [name], MD for a complaint of diverticulosis and eructations.      08/22/2004 last colonoscopy is listed in history.  We do not have the report.    07/04/2019 patient seen in the urgent care for pain of the upper abdomen.  She described nausea and vomiting at that time.  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  No evidence of acute diverticulitis.  Patient was diagnosed with gastritis and prescribed omeprazole 20 mg daily as well as Zofran.    07/05/2019 patient seen in the ED for abdominal pain and diagnosed with acute cystitis.  That time labs showed a total bilirubin elevated at 1.9 white count elevated at 17.6.  Patient was given Keflex.    07/14/2019 patient followed up with PCP and at that time was doing much better with no abdominal pain, nausea vomiting and her appetite was improving.    Today, the patient tells me that she has what they think was diverticulitis diagnosed back at the end of March 3/28.  She was given Cipro and Flagyl and things slowly got better.  Tells me her last colonoscopy was a long time ago but she has had Cologuard every 3 years.  Her last was 2 years ago and negative for her.    Patient then describes that she is no longer having abdominal pain but does have a lot of belching.  Tells me she gets up and sits on the side of the bed and she will have a big belch.  Also when she walks she will pass gas.  Describes normal bowel movements.  Does tell me though that she moved in with her  son and daughter-in-law about a year ago and they have "a completely different diet than me".  Tells me that they eat a lot of spicy and greasy foods and tend to eat out almost every night of the week.  They also tend to eat very late around 8:00 at night.  Patient does feel some better with the Omeprazole 20 mg daily which she was given in the ER.    Denies fever, chills, weight loss or symptoms that awaken her from sleep.     Past Medical History:  Diagnosis Date  . AICD (automatic cardioverter/defibrillator) present 10/08/2014   SUBQ    /    DR [name]  . CHF (congestive heart failure) ([place])   . Diabetes mellitus without complication ([place])   . History of cardiac cath 05/2007   normal-with patent coronaries  . History of colonoscopy   . History of hiatal hernia   . Hypertension   . Iatrogenic thyroiditis   . Non-ischemic cardiomyopathy ([place])    EF 28%- reassessment of LV function 2011 with LVEf 45-50%  . PAD (peripheral artery disease) (HCC)    lower extremities with ABIs of 0.5 bilaterally  . Personal history of goiter   . S/P radioactive iodine thyroid ablation   . S/P thyroidectomy   . Shortness of breath dyspnea     Past Surgical History:  Procedure Laterality Date  . EP  IMPLANTABLE DEVICE N/A 10/08/2014   Procedure: SubQ ICD Implant;  Surgeon: [name], MD;  Location: [facility];  Service: Cardiovascular;  Laterality: N/A;  . THYROIDECTOMY    . TONSILLECTOMY    . TOTAL ABDOMINAL HYSTERECTOMY      Current Outpatient Medications  Medication Sig Dispense Refill  . acetaminophen (TYLENOL) 500 MG tablet Take 1 tablet (500 mg total) by mouth every 6 (six) hours as needed. (Patient taking differently: Take 500 mg by mouth every 6 (six) hours as needed for mild pain. ) 30 tablet 0  . allopurinol (ZYLOPRIM) 100 MG tablet Take 100 mg by mouth daily.    Marland Kitchen aspirin 81 MG tablet Take 81 mg by mouth daily.      Marland Kitchen atorvastatin (LIPITOR) 40 MG tablet TAKE 1 AND 1/2 TABLETS(60  MG) BY MOUTH DAILY (Patient taking differently: Take 50 mg by mouth daily. ) 135 tablet 0  . carvedilol (COREG) 25 MG tablet TAKE 1 TABLET BY MOUTH TWICE A DAY WITH FOOD (Patient taking differently: Take 25 mg by mouth 2 (two) times daily with a meal. ) 180 tablet 2  . furosemide (LASIX) 40 MG tablet TAKE 1 TABLET BY MOUTH TWICE DAILY (Patient taking differently: Take 40 mg by mouth 2 (two) times daily. ) 180 tablet 1  . glipiZIDE (GLUCOTROL XL) 2.5 MG 24 hr tablet Take 1 tablet (2.5 mg total) by mouth daily with breakfast. 30 tablet 3  . glucose blood (ONETOUCH VERIO) test strip Use as instructed to check blood sugar once a day (Patient taking differently: 1 each by Other route See admin instructions. Use as instructed to check blood sugar once a day) 100 each 1  . hydroxychloroquine (PLAQUENIL) 200 MG tablet Take 1 tablet by mouth 2 (two) times daily.  3  . isosorbide mononitrate (IMDUR) 30 MG 24 hr tablet TAKE 1 TABLET(30 MG) BY MOUTH DAILY (Patient taking differently: Take 30 mg by mouth daily. ) 90 tablet 2  . levothyroxine (SYNTHROID) 175 MCG tablet Take 1 tablet (175 mcg total) by mouth daily before breakfast. 30 tablet 2  . omeprazole (PRILOSEC) 20 MG capsule Take 1 capsule (20 mg total) by mouth daily. 30 capsule 1  . ondansetron (ZOFRAN) 4 MG tablet Take 1 tablet (4 mg total) by mouth every 6 (six) hours. 12 tablet 0  . OneTouch Delica Lancets 08M MISC 1 Device by Other route daily. 100 each 1  . sacubitril-valsartan (ENTRESTO) 49-51 MG Take 1 tablet by mouth 2 (two) times daily. 60 tablet 10  . spironolactone (ALDACTONE) 50 MG tablet TAKE 1 TABLET(50 MG) BY MOUTH DAILY (Patient taking differently: Take 50 mg by mouth daily. ) 90 tablet 3   No current facility-administered medications for this visit.    Allergies as of 07/24/2019 - Review Complete 07/24/2019  Allergen Reaction Noted  . Ibuprofen Nausea Only 10/08/2014    Family History  Problem Relation Age of Onset  . Diabetes type  II Mother   . Hypertension Mother   . Diabetes Mother   . Heart disease Mother   . Other Father        deceased from accident age 54  . Hyperlipidemia Father   . Breast cancer Neg Hx   . Stomach cancer Neg Hx   . Colon cancer Neg Hx   . Esophageal cancer Neg Hx   . Pancreatic cancer Neg Hx     Social History   Socioeconomic History  . Marital status: Divorced  Spouse name: Not on file  . Number of children: 1  . Years of education: Not on file  . Highest education level: Not on file  Occupational History  . Occupation: Retired    Fish farm manager: RETIRED    CommentAdvertising copywriter  Tobacco Use  . Smoking status: Light Tobacco Smoker    Packs/day: 0.25    Years: 40.00    Pack years: 10.00    Types: Cigarettes    Last attempt to quit: 07/03/2018    Years since quitting: 1.0  . Smokeless tobacco: Never Used  . Tobacco comment: on and off   Substance and Sexual Activity  . Alcohol use: Yes    Alcohol/week: 0.0 standard drinks    Comment: rare glass of wine  . Drug use: No  . Sexual activity: Not Currently    Birth control/protection: None  Other Topics Concern  . Not on file  Social History Narrative   Retired Education officer, museum   Divorced - one grown son   current smoker    Alcohol use-no      Drug use-no    Regular exercise- no     son - Daysy Santini   Social Determinants of Health   Financial Resource Strain:   . Difficulty of Paying Living Expenses:   Food Insecurity:   . Worried About Charity fundraiser in the Last Year:   . Arboriculturist in the Last Year:   Transportation Needs:   . Film/video editor (Medical):   Marland Kitchen Lack of Transportation (Non-Medical):   Physical Activity:   . Days of Exercise per Week:   . Minutes of Exercise per Session:   Stress:   . Feeling of Stress :   Social Connections:   . Frequency of Communication with Friends and Family:   . Frequency of Social Gatherings with Friends and Family:   . Attends Religious Services:   .  Active Member of Clubs or Organizations:   . Attends Archivist Meetings:   Marland Kitchen Marital Status:   Intimate Partner Violence:   . Fear of Current or Ex-Partner:   . Emotionally Abused:   Marland Kitchen Physically Abused:   . Sexually Abused:     Review of Systems:    Constitutional: No weight loss, fever or chills Skin: No rash  Cardiovascular: No chest pain Respiratory: No SOB  Gastrointestinal: See HPI and otherwise negative Genitourinary: No dysuria  Neurological: No headache Musculoskeletal: No new muscle or joint pain Hematologic: No bleeding  Psychiatric: No history of depression or anxiety   Physical Exam:  Vital signs: BP 138/82   Pulse 78   Temp 98 F (36.7 C)   Ht 5\' 10"  (1.778 m)   Wt 226 lb (102.5 kg)   BMI 32.43 kg/m   Constitutional:   Pleasant AA female appears to be in NAD, Well developed, Well nourished, alert and cooperative Head:  Normocephalic and atraumatic. Eyes:   PEERL, EOMI. No icterus. Conjunctiva pink. Ears:  Normal auditory acuity. Neck:  Supple Throat: Oral cavity and pharynx without inflammation, swelling or lesion.  Respiratory: Respirations even and unlabored. Lungs clear to auscultation bilaterally.   No wheezes, crackles, or rhonchi.  Cardiovascular: Normal S1, S2. No MRG. Regular rate and rhythm. No peripheral edema, cyanosis or pallor.  Gastrointestinal:  Soft, nondistended, nontender. No rebound or guarding. Normal bowel sounds. No appreciable masses or hepatomegaly. Rectal:  Not performed.  Msk:  Symmetrical without gross deformities. Without edema, no deformity or joint  abnormality.  Neurologic:  Alert and  oriented x4;  grossly normal neurologically.  Skin:   Dry and intact without significant lesions or rashes. Psychiatric: Demonstrates good judgement and reason without abnormal affect or behaviors.  RELEVANT LABS AND IMAGING (and see HPI): CBC    Component Value Date/Time   WBC 9.0 07/14/2019 1018   RBC 4.17 07/14/2019 1018    HGB 13.0 07/14/2019 1018   HCT 40.5 07/14/2019 1018   PLT 488.0 (H) 07/14/2019 1018   MCV 97.0 07/14/2019 1018   MCH 30.6 07/05/2019 1034   MCHC 32.2 07/14/2019 1018   RDW 14.4 07/14/2019 1018   LYMPHSABS 1.9 03/14/2018 0050   MONOABS 0.5 03/14/2018 0050   EOSABS 0.0 03/14/2018 0050   BASOSABS 0.0 03/14/2018 0050    CMP     Component Value Date/Time   NA 139 07/14/2019 1018   NA 143 06/13/2019 1428   K 4.5 07/14/2019 1018   CL 102 07/14/2019 1018   CO2 29 07/14/2019 1018   GLUCOSE 190 (H) 07/14/2019 1018   BUN 13 07/14/2019 1018   BUN 17 06/13/2019 1428   CREATININE 1.35 (H) 07/14/2019 1018   CREATININE 0.99 (H) 09/26/2017 0000   CALCIUM 8.9 07/14/2019 1018   PROT 7.7 07/05/2019 1034   ALBUMIN 2.9 (L) 07/05/2019 1034   AST 17 07/05/2019 1034   ALT 18 07/05/2019 1034   ALKPHOS 92 07/05/2019 1034   BILITOT 1.9 (H) 07/05/2019 1034   GFRNONAA 28 (L) 07/05/2019 1034   GFRAA 33 (L) 07/05/2019 1034    Assessment: 1.  Eructations: Consider relation to gastritis given patient's drastic change in diet and eating times over the past year 2.  History of diverticulitis: Better after Cipro and Flagyl  Plan: 1.  Increased patient's Omeprazole to 40 mg twice daily, 30 to 60 minutes before breakfast and dinner.  Patient will continue this for the next 4 to 6 weeks.  At follow-up we will decrease to once daily dosing if she is doing well. 2.  Reviewed antireflux diet and lifestyle modifications.  Suggested the patient try to make her own meals or eat earlier in the evening.  She verbalized understanding. 3.  Patient to follow with me in 4 to 6 weeks.  She was assigned to Dr. Havery Moros this morning.  Paula Newer, PA-C [facility] 07/24/2019, 8:39 AM  Cc: [name], MD

## 2019-07-24 NOTE — Patient Instructions (Addendum)
If you are age 76 or older, your body mass index should be between 23-30. Your Body mass index is 32.43 kg/m. If this is out of the aforementioned range listed, please consider follow up with your Primary Care Provider.  If you are age 20 or younger, your body mass index should be between 19-25. Your Body mass index is 32.43 kg/m. If this is out of the aformentioned range listed, please consider follow up with your Primary Care Provider.   We have sent the following medications to your pharmacy for you to pick up at your convenience: Increase to Omeprazole 40 mg twice daily 30-60 minutes before breakfast and dinner.

## 2019-07-25 ENCOUNTER — Ambulatory Visit (INDEPENDENT_AMBULATORY_CARE_PROVIDER_SITE_OTHER): Payer: Medicare PPO | Admitting: *Deleted

## 2019-07-25 DIAGNOSIS — Z95810 Presence of automatic (implantable) cardiac defibrillator: Secondary | ICD-10-CM

## 2019-08-12 ENCOUNTER — Other Ambulatory Visit: Payer: Self-pay

## 2019-08-13 ENCOUNTER — Encounter: Payer: Self-pay | Admitting: Family Medicine

## 2019-08-13 ENCOUNTER — Other Ambulatory Visit: Payer: Self-pay | Admitting: Family Medicine

## 2019-08-13 ENCOUNTER — Ambulatory Visit (INDEPENDENT_AMBULATORY_CARE_PROVIDER_SITE_OTHER): Payer: Medicare PPO | Admitting: Family Medicine

## 2019-08-13 VITALS — BP 136/80 | HR 94 | Temp 97.8°F | Wt 214.0 lb

## 2019-08-13 DIAGNOSIS — F17210 Nicotine dependence, cigarettes, uncomplicated: Secondary | ICD-10-CM

## 2019-08-13 DIAGNOSIS — N1832 Chronic kidney disease, stage 3b: Secondary | ICD-10-CM | POA: Diagnosis not present

## 2019-08-13 DIAGNOSIS — E1149 Type 2 diabetes mellitus with other diabetic neurological complication: Secondary | ICD-10-CM | POA: Diagnosis not present

## 2019-08-13 DIAGNOSIS — E039 Hypothyroidism, unspecified: Secondary | ICD-10-CM

## 2019-08-13 LAB — COMPREHENSIVE METABOLIC PANEL
ALT: 12 U/L (ref 0–35)
AST: 13 U/L (ref 0–37)
Albumin: 3.8 g/dL (ref 3.5–5.2)
Alkaline Phosphatase: 111 U/L (ref 39–117)
BUN: 15 mg/dL (ref 6–23)
CO2: 27 mEq/L (ref 19–32)
Calcium: 9.3 mg/dL (ref 8.4–10.5)
Chloride: 107 mEq/L (ref 96–112)
Creatinine, Ser: 1.21 mg/dL — ABNORMAL HIGH (ref 0.40–1.20)
GFR: 52.34 mL/min — ABNORMAL LOW (ref >60.00)
Glucose, Bld: 143 mg/dL — ABNORMAL HIGH (ref 70–99)
Potassium: 4.2 mEq/L (ref 3.5–5.1)
Sodium: 142 mEq/L (ref 135–145)
Total Bilirubin: 0.6 mg/dL (ref 0.2–1.2)
Total Protein: 7.1 g/dL (ref 6.0–8.3)

## 2019-08-13 LAB — TSH: TSH: 0.17 u[IU]/mL — ABNORMAL LOW (ref 0.35–4.50)

## 2019-08-13 MED ORDER — LEVOTHYROXINE SODIUM 150 MCG PO TABS: 150.0000 ug | ORAL_TABLET | Freq: Every day | ORAL | 3 refills | Status: DC

## 2019-08-13 NOTE — Patient Instructions (Signed)
Hypothyroidism  Hypothyroidism is when the thyroid gland does not make enough of certain hormones (it is underactive). The thyroid gland is a small gland located in the lower front part of the neck, just in front of the windpipe (trachea). This gland makes hormones that help control how the body uses food for energy (metabolism) as well as how the heart and brain function. These hormones also play a role in keeping your bones strong. When the thyroid is underactive, it produces too little of the hormones thyroxine (T4) and triiodothyronine (T3). What are the causes? This condition may be caused by:  Hashimoto's disease. This is a disease in which the body's disease-fighting system (immune system) attacks the thyroid gland. This is the most common cause.  Viral infections.  Pregnancy.  Certain medicines.  Birth defects.  Past radiation treatments to the head or neck for cancer.  Past treatment with radioactive iodine.  Past exposure to radiation in the environment.  Past surgical removal of part or all of the thyroid.  Problems with a gland in the center of the brain (pituitary gland).  Lack of enough iodine in the diet. What increases the risk? You are more likely to develop this condition if:  You are female.  You have a family history of thyroid conditions.  You use a medicine called lithium.  You take medicines that affect the immune system (immunosuppressants). What are the signs or symptoms? Symptoms of this condition include:  Feeling as though you have no energy (lethargy).  Not being able to tolerate cold.  Weight gain that is not explained by a change in diet or exercise habits.  Lack of appetite.  Dry skin.  Coarse hair.  Menstrual irregularity.  Slowing of thought processes.  Constipation.  Sadness or depression. How is this diagnosed? This condition may be diagnosed based on:  Your symptoms, your medical history, and a physical exam.  Blood  tests. You may also have imaging tests, such as an ultrasound or MRI. How is this treated? This condition is treated with medicine that replaces the thyroid hormones that your body does not make. After you begin treatment, it may take several weeks for symptoms to go away. Follow these instructions at home:  Take over-the-counter and prescription medicines only as told by your health care provider.  If you start taking any new medicines, tell your health care provider.  Keep all follow-up visits as told by your health care provider. This is important. ? As your condition improves, your dosage of thyroid hormone medicine may change. ? You will need to have blood tests regularly so that your health care provider can monitor your condition. Contact a health care provider if:  Your symptoms do not get better with treatment.  You are taking thyroid replacement medicine and you: ? Sweat a lot. ? Have tremors. ? Feel anxious. ? Lose weight rapidly. ? Cannot tolerate heat. ? Have emotional swings. ? Have diarrhea. ? Feel weak. Get help right away if you have:  Chest pain.  An irregular heartbeat.  A rapid heartbeat.  Difficulty breathing. Summary  Hypothyroidism is when the thyroid gland does not make enough of certain hormones (it is underactive).  When the thyroid is underactive, it produces too little of the hormones thyroxine (T4) and triiodothyronine (T3).  The most common cause is Hashimoto's disease, a disease in which the body's disease-fighting system (immune system) attacks the thyroid gland. The condition can also be caused by viral infections, medicine, pregnancy, or past   radiation treatment to the head or neck.  Symptoms may include weight gain, dry skin, constipation, feeling as though you do not have energy, and not being able to tolerate cold.  This condition is treated with medicine to replace the thyroid hormones that your body does not make. This information  is not intended to replace advice given to you by your health care provider. Make sure you discuss any questions you have with your health care provider. Document Revised: 03/02/2017 Document Reviewed: 02/28/2017 Elsevier Patient Education  2020 Elsevier Inc.  

## 2019-08-13 NOTE — Progress Notes (Signed)
Subjective:    Patient ID: Paula Stein, female    DOB: 12-30-1945, 74 y.o.   MRN: [id number]  No chief complaint on file.   HPI Pt is a 74 yo female with pmh sig for combined CHF, and ICM, PAD, CAD, DM 2 with neuropathy, HTN, CKD 3, hypothyroidism who was seen for f/u.  Pt states she is feeling much better since last OFV.  Pt notes DOE with increased walking.  Denies LE edema, cough, SOB.  Wants to walk for exercise.  Is hoping she can stop taking so many meds.  Pt cut down on cigarette use.  May have smoked 20 cigarettes since visit 1 month ago.  Pt also eating better.  Eating more salmon.  Cut down on red meat, sugar, and honey roaster cashews.  States blood sugar has been good.  Notes in the past was not always taking levothyroxine first thing in am or at all.  Past Medical History:  Diagnosis Date  . AICD (automatic cardioverter/defibrillator) present 10/08/2014   SUBQ    /    DR [name]  . CHF (congestive heart failure) ([place])   . Diabetes mellitus without complication ([place])   . History of cardiac cath 05/2007   normal-with patent coronaries  . History of colonoscopy   . History of hiatal hernia   . Hypertension   . Iatrogenic thyroiditis   . Non-ischemic cardiomyopathy ([place])    EF 28%- reassessment of LV function 2011 with LVEf 45-50%  . PAD (peripheral artery disease) ([facility])    lower extremities with ABIs of 0.5 bilaterally  . Personal history of goiter   . S/P radioactive iodine thyroid ablation   . S/P thyroidectomy   . Shortness of breath dyspnea     Allergies  Allergen Reactions  . Ibuprofen Nausea Only    ROS General: Denies fever, chills, night sweats, changes in weight, changes in appetite HEENT: Denies headaches, ear pain, changes in vision, rhinorrhea, sore throat CV: Denies CP, palpitations, SOB, orthopnea Pulm: Denies SOB, cough, wheezing GI: Denies abdominal pain, nausea, vomiting, diarrhea, constipation GU: Denies dysuria, hematuria, frequency, vaginal  discharge Msk: Denies muscle cramps, joint pains Neuro: Denies weakness, numbness, tingling Skin: Denies rashes, bruising Psych: Denies depression, anxiety, hallucinations    Objective:    Blood pressure 136/80, pulse 94, temperature 97.8 F (36.6 C), temperature source Temporal, weight 214 lb (97.1 kg), SpO2 96 %.  Gen. Pleasant, well-nourished, in no distress, normal affect   HEENT: [place]/AT, face symmetric, no scleral icterus, PERRLA, EOMI, nares patent without drainage Lungs: no accessory muscle use, CTAB, no wheezes or rales Cardiovascular: RRR, no m/r/g, no peripheral edema Musculoskeletal: No deformities, no cyanosis or clubbing, normal tone Neuro:  A&Ox3, CN II-XII intact, normal gait Skin:  Warm, no lesions/ rash  Wt Readings from Last 3 Encounters:  08/13/19 214 lb (97.1 kg)  07/24/19 226 lb (102.5 kg)  07/14/19 229 lb (103.9 kg)    Lab Results  Component Value Date   WBC 9.0 07/14/2019   HGB 13.0 07/14/2019   HCT 40.5 07/14/2019   PLT 488.0 (H) 07/14/2019   GLUCOSE 190 (H) 07/14/2019   CHOL 228 (H) 02/06/2019   TRIG 165 (H) 02/06/2019   HDL 91 02/06/2019   LDLDIRECT 195.2 02/15/2011   LDLCALC 109 (H) 02/06/2019   ALT 18 07/05/2019   AST 17 07/05/2019   NA 139 07/14/2019   K 4.5 07/14/2019   CL 102 07/14/2019   CREATININE 1.35 (H) 07/14/2019   BUN  13 07/14/2019   CO2 29 07/14/2019   TSH 48.16 (H) 07/14/2019   INR 0.9 10/01/2014   HGBA1C 7.2 (A) 07/14/2019   MICROALBUR 33.5 (H) 06/02/2015    Assessment/Plan:  Acquired hypothyroidism  -last TSH 48.16 on 07/14/19. -continue synthroid 175 mcg -given handout - Plan: TSH  Stage 3b chronic kidney disease  -referral placed to nephrology on 07/14/19 - Plan: Comprehensive metabolic panel  Cigarette nicotine dependence without complication -Smoking cessation counseling greater than 3 minutes, less than 10 minutes -Patient congratulated on cutting down.  May smoke 1-2 cigarettes/day -Encouraged to  continue -Discussed quit aids.  Patient declines at this time -We will continue to monitor  Type 2 diabetes with neurologic complications -Stable -Hemoglobin A1c 7.2% on 07/14/2019 -FSBS 121 -Continue current meds including glipizide XL 2.5 mg -On statin and ACE -Continue lifestyle modifications  F/u  In 3-4 months, sooner if needed  Grier Mitts, MD

## 2019-08-21 ENCOUNTER — Ambulatory Visit: Payer: Self-pay | Admitting: Physician Assistant

## 2019-08-22 ENCOUNTER — Other Ambulatory Visit: Payer: Self-pay | Admitting: Internal Medicine

## 2019-08-26 ENCOUNTER — Telehealth: Payer: Self-pay

## 2019-08-26 NOTE — Telephone Encounter (Signed)
Left message for patient to remind of missed remote transmission.  

## 2019-09-10 ENCOUNTER — Ambulatory Visit (INDEPENDENT_AMBULATORY_CARE_PROVIDER_SITE_OTHER): Payer: Medicare PPO | Admitting: *Deleted

## 2019-09-10 DIAGNOSIS — I428 Other cardiomyopathies: Secondary | ICD-10-CM | POA: Diagnosis not present

## 2019-09-10 LAB — CUP PACEART REMOTE DEVICE CHECK
Battery Remaining Percentage: 9 %
Date Time Interrogation Session: 20210609071400
Implantable Lead Implant Date: 20160707
Implantable Lead Location: 753862
Implantable Lead Model: 3401
Implantable Pulse Generator Implant Date: 20160707
Pulse Gen Serial Number: 115852

## 2019-09-11 NOTE — Progress Notes (Signed)
Remote ICD transmission.   

## 2019-09-24 ENCOUNTER — Ambulatory Visit: Payer: Medicare Other | Admitting: Podiatry

## 2019-09-26 ENCOUNTER — Other Ambulatory Visit: Payer: Self-pay | Admitting: Family Medicine

## 2019-09-26 ENCOUNTER — Other Ambulatory Visit: Payer: Self-pay

## 2019-09-26 MED ORDER — GLIPIZIDE ER 2.5 MG PO TB24: 2.5000 mg | ORAL_TABLET | Freq: Every day | ORAL | 3 refills | Status: DC

## 2019-09-29 ENCOUNTER — Other Ambulatory Visit: Payer: Self-pay

## 2019-09-29 ENCOUNTER — Other Ambulatory Visit (INDEPENDENT_AMBULATORY_CARE_PROVIDER_SITE_OTHER): Payer: Medicare PPO

## 2019-09-29 DIAGNOSIS — E039 Hypothyroidism, unspecified: Secondary | ICD-10-CM | POA: Diagnosis not present

## 2019-09-29 LAB — TSH: TSH: 0.06 u[IU]/mL — ABNORMAL LOW (ref 0.35–4.50)

## 2019-10-08 ENCOUNTER — Ambulatory Visit: Payer: Medicare Other | Admitting: Podiatry

## 2019-10-09 NOTE — Addendum Note (Signed)
Addended by: Agnes Lawrence on: 10/09/2019 09:54 AM   Modules accepted: Orders

## 2019-10-13 ENCOUNTER — Other Ambulatory Visit: Payer: Self-pay | Admitting: Family Medicine

## 2019-10-15 ENCOUNTER — Telehealth: Payer: Self-pay | Admitting: Emergency Medicine

## 2019-10-15 NOTE — Telephone Encounter (Signed)
Made patient aware battery for ICD at [facility] and important that she make appointment with Dr [name] to discuss gen change. She reports she has issues doing telemedicine visits and would prefer in person visit. Will send to scheduler to get on DR Klein's schedule ASAP .

## 2019-10-15 NOTE — Telephone Encounter (Signed)
Appointment scheduled with Dr [name] on 10/16/2019.  Pt is aware.

## 2019-10-16 ENCOUNTER — Telehealth (INDEPENDENT_AMBULATORY_CARE_PROVIDER_SITE_OTHER): Payer: Medicare PPO | Admitting: Internal Medicine

## 2019-10-16 ENCOUNTER — Other Ambulatory Visit: Payer: Self-pay

## 2019-10-16 ENCOUNTER — Telehealth: Payer: Self-pay

## 2019-10-16 VITALS — BP 132/80 | Wt 210.0 lb

## 2019-10-16 DIAGNOSIS — I428 Other cardiomyopathies: Secondary | ICD-10-CM | POA: Diagnosis not present

## 2019-10-16 DIAGNOSIS — I10 Essential (primary) hypertension: Secondary | ICD-10-CM | POA: Diagnosis not present

## 2019-10-16 DIAGNOSIS — Z95810 Presence of automatic (implantable) cardiac defibrillator: Secondary | ICD-10-CM

## 2019-10-16 DIAGNOSIS — I5042 Chronic combined systolic (congestive) and diastolic (congestive) heart failure: Secondary | ICD-10-CM | POA: Diagnosis not present

## 2019-10-16 NOTE — Patient Instructions (Signed)
Medication Instructions:  Your physician recommends that you continue on your current medications as directed. Please refer to the Current Medication list given to you today.  Labwork: None ordered.  Testing/Procedures: None ordered.  Follow-Up: Your physician wants you to follow-up Thursday, 10/23/2019 at 840am.   Remote monitoring is used to monitor your Pacemaker of ICD from home. This monitoring reduces the number of office visits required to check your device to one time per year. It allows [place] to keep an eye on the functioning of your device to ensure it is working properly.   Any Other Special Instructions Will Be Listed Below (If Applicable).  If you need a refill on your cardiac medications before your next appointment, please call your pharmacy.

## 2019-10-16 NOTE — Progress Notes (Signed)
Electrophysiology TeleHealth Note   Due to national recommendations of social distancing due to COVID 19, an audio/video telehealth visit is felt to be most appropriate for this patient at this time.  See MyChart message from today for the patient's consent to telehealth for [facility].   Date:  10/16/2019   ID:  Paula Stein, DOB Mar 13, 1946, MRN [id number]  Location: patient's home  Provider location: [address], [place]  Evaluation Performed: Follow-up visit  PCP:  [name], MD  Cardiologist:  [facility]   Electrophysiologist:  SK   Chief Complaint:  dyspnea and ICD  History of Present Illness:    Paula Stein is a 74 y.o. female who presents via audio/video conferencing for a telehealth visit today.  Since last being seen in our clinic SICD for primary prevention in the setting of NICM and having been advised of Class 1 recall for early battery depletion , the patient reports DOE with some edema ( asymmetric) no traveling  No Clots; no pleuritic pain   No energy;  CPAP 4/7 nights   No palpitations.    Her device has reached premature battery depletion  DATE TEST EF   4/16 Echo  30-35 % Severe LVH/LAE  2/19 Echo 25-30% Severe LVH  3/20 Echo  30-35%     Date Cr K TSH  12/17 1.02 4.4 6.34  9/18 1.22 4.4 39.32  6/19 0.99 4.3 2.78  6/20  1.48 4.1 (12/19) 4.6  6/21 1.21 4.2 0.06    The patient denies symptoms of fevers, chills, cough, or new SOB worrisome for COVID 19.    Past Medical History:  Diagnosis Date  . AICD (automatic cardioverter/defibrillator) present 10/08/2014   SUBQ    /    DR [name]  . CHF (congestive heart failure) ([place])   . Diabetes mellitus without complication ([place])   . History of cardiac cath 05/2007   normal-with patent coronaries  . History of colonoscopy   . History of hiatal hernia   . Hypertension   . Iatrogenic thyroiditis   . Non-ischemic cardiomyopathy ([place])    EF 28%- reassessment of LV function 2011 with LVEf  45-50%  . PAD (peripheral artery disease) ([facility])    lower extremities with ABIs of 0.5 bilaterally  . Personal history of goiter   . S/P radioactive iodine thyroid ablation   . S/P thyroidectomy   . Shortness of breath dyspnea     Past Surgical History:  Procedure Laterality Date  . EP IMPLANTABLE DEVICE N/A 10/08/2014   Procedure: SubQ ICD Implant;  Surgeon: [name], MD;  Location: [facility];  Service: Cardiovascular;  Laterality: N/A;  . THYROIDECTOMY    . TONSILLECTOMY    . TOTAL ABDOMINAL HYSTERECTOMY      Current Outpatient Medications  Medication Sig Dispense Refill  . acetaminophen (TYLENOL) 500 MG tablet Take 1 tablet (500 mg total) by mouth every 6 (six) hours as needed. (Patient taking differently: Take 500 mg by mouth every 6 (six) hours as needed for mild pain. ) 30 tablet 0  . allopurinol (ZYLOPRIM) 100 MG tablet Take 100 mg by mouth daily.    Marland Kitchen aspirin 81 MG tablet Take 81 mg by mouth daily.      Marland Kitchen atorvastatin (LIPITOR) 40 MG tablet TAKE 1 AND 1/2 TABLETS(60 MG) BY MOUTH DAILY (Patient taking differently: Take 50 mg by mouth daily. ) 135 tablet 0  . carvedilol (COREG) 25 MG tablet TAKE  1 TABLET BY MOUTH TWICE A DAY WITH FOOD (Patient taking differently: Take 25 mg by mouth 2 (two) times daily with a meal. ) 180 tablet 2  . furosemide (LASIX) 40 MG tablet TAKE 1 TABLET BY MOUTH TWICE DAILY (Patient taking differently: Take 40 mg by mouth 2 (two) times daily. ) 180 tablet 1  . glipiZIDE (GLUCOTROL XL) 2.5 MG 24 hr tablet Take 1 tablet (2.5 mg total) by mouth daily with breakfast. 30 tablet 3  . glucose blood (ONETOUCH VERIO) test strip 1 each by Other route See admin instructions. Use as instructed to check blood sugar once a day 100 strip 1  . hydroxychloroquine (PLAQUENIL) 200 MG tablet Take 1 tablet by mouth 2 (two) times daily.  3  . isosorbide mononitrate (IMDUR) 30 MG 24 hr tablet TAKE 1 TABLET(30 MG) BY MOUTH DAILY (Patient taking differently: Take 30 mg  by mouth daily. ) 90 tablet 2  . levothyroxine (SYNTHROID) 150 MCG tablet Take 1 tablet (150 mcg total) by mouth daily before breakfast. 30 tablet 3  . omeprazole (PRILOSEC) 40 MG capsule Take 1 capsule (40 mg total) by mouth 2 (two) times daily before a meal. 60 capsule 2  . ondansetron (ZOFRAN) 4 MG tablet Take 1 tablet (4 mg total) by mouth every 6 (six) hours. 12 tablet 0  . sacubitril-valsartan (ENTRESTO) 49-51 MG Take 1 tablet by mouth 2 (two) times daily. 60 tablet 10  . spironolactone (ALDACTONE) 50 MG tablet TAKE 1 TABLET(50 MG) BY MOUTH DAILY (Patient taking differently: Take 50 mg by mouth daily. ) 90 tablet 3  . OneTouch Delica Lancets 24O MISC 1 Device by Other route daily. 100 each 1   No current facility-administered medications for this visit.    Allergies:   Ibuprofen   Social History:  The patient  reports that she has been smoking cigarettes. She has a 10.00 pack-year smoking history. She has never used smokeless tobacco. She reports current alcohol use. She reports that she does not use drugs.   Family History:  The patient's   family history includes Diabetes in her mother; Diabetes type II in her mother; Heart disease in her mother; Hyperlipidemia in her father; Hypertension in her mother; Other in her father.   ROS:  Please see the history of present illness.   All other systems are personally reviewed and negative.    Exam:    Vital Signs:  BP 132/80   Wt 210 lb (95.3 kg)   BMI 30.13 kg/m        Labs/Other Tests and Data Reviewed:    Recent Labs: 07/14/2019: Hemoglobin 13.0; Platelets 488.0 08/13/2019: ALT 12; BUN 15; Creatinine, Ser 1.21; Potassium 4.2; Sodium 142 09/29/2019: TSH 0.06   Wt Readings from Last 3 Encounters:  10/16/19 210 lb (95.3 kg)  08/13/19 214 lb (97.1 kg)  07/24/19 226 lb (102.5 kg)     Other studies personally reviewed:    Last device remote is reviewed from [place] [facility] PDF dated 7/21 which reveals normal device function,    arrhythmias - none  DEvice has reached premature battery depletion    ASSESSMENT & PLAN:   Nonischemic cardiomyopathy  CHF  Chronic systolic  ICD-Subcutaneous       Class 1 recall Generator premature depletion  Obesity  Hypertension  End of life discussion  High Risk Medication Surveillance aldactone   The pts device has reached premature depletion.  It will need to be changed out  At 74 yo however there are  not strong data to support primary prevention ICD in NICM  We have discussed this and relationship to end of life care and sudden v nonsudden death.   We will review it in the office next week  She is also dyspneic and not sure why, some asymmertry but not hx of risk for DVT, more likely CHF so have encouraged decrease sodium intake and will assess in office next week;  Could also be Afib, needs ECG   Continue other meds for now     COVID 19 screen The patient denies symptoms of COVID 19 at this time.  The importance of social distancing was discussed today.  Follow-up:  Next Thursday AM at [facility] in [place] 7/22 Next remote: As Scheduled   Current medicines are reviewed at length with the patient today.   The patient does not have concerns regarding her medicines.  The following changes were made today:  none  Labs/ tests ordered today include:   No orders of the defined types were placed in this encounter.    Patient Risk:  after full review of this patients clinical status, I feel that they are at moderate risk at this time.  Today, I have spent 12 minutes with the patient with telehealth technology discussing the above.  Signed, Virl Axe, MD  10/16/2019 3:56 PM     [facility] [address] [place] [address] [phone] (office) [phone] (fax)

## 2019-10-16 NOTE — Telephone Encounter (Signed)
  Patient Consent for Virtual Visit         Paula Stein has provided verbal consent on 10/16/2019 for a virtual visit (video or telephone).   CONSENT FOR VIRTUAL VISIT FOR:  Paula Stein  By participating in this virtual visit I agree to the following:  I hereby voluntarily request, consent and authorize [facility] and its employed or contracted physicians, physician assistants, nurse practitioners or other licensed health care professionals (the Practitioner), to provide me with telemedicine health care services (the "Services") as deemed necessary by the treating Practitioner. I acknowledge and consent to receive the Services by the Practitioner via telemedicine. I understand that the telemedicine visit will involve communicating with the Practitioner through live audiovisual communication technology and the disclosure of certain medical information by electronic transmission. I acknowledge that I have been given the opportunity to request an in-person assessment or other available alternative prior to the telemedicine visit and am voluntarily participating in the telemedicine visit.  I understand that I have the right to withhold or withdraw my consent to the use of telemedicine in the course of my care at any time, without affecting my right to future care or treatment, and that the Practitioner or I may terminate the telemedicine visit at any time. I understand that I have the right to inspect all information obtained and/or recorded in the course of the telemedicine visit and may receive copies of available information for a reasonable fee.  I understand that some of the potential risks of receiving the Services via telemedicine include:  Marland Kitchen Delay or interruption in medical evaluation due to technological equipment failure or disruption; . Information transmitted may not be sufficient (e.g. poor resolution of images) to allow for appropriate medical decision making by the Practitioner;  and/or  . In rare instances, security protocols could fail, causing a breach of personal health information.  Furthermore, I acknowledge that it is my responsibility to provide information about my medical history, conditions and care that is complete and accurate to the best of my ability. I acknowledge that Practitioner's advice, recommendations, and/or decision may be based on factors not within their control, such as incomplete or inaccurate data provided by me or distortions of diagnostic images or specimens that may result from electronic transmissions. I understand that the practice of medicine is not an exact science and that Practitioner makes no warranties or guarantees regarding treatment outcomes. I acknowledge that a copy of this consent can be made available to me via my patient portal ([facility]), or I can request a printed copy by calling the office of [employer].    I understand that my insurance will be billed for this visit.   I have read or had this consent read to me. . I understand the contents of this consent, which adequately explains the benefits and risks of the Services being provided via telemedicine.  . I have been provided ample opportunity to ask questions regarding this consent and the Services and have had my questions answered to my satisfaction. . I give my informed consent for the services to be provided through the use of telemedicine in my medical care

## 2019-10-21 ENCOUNTER — Other Ambulatory Visit: Payer: Self-pay

## 2019-10-21 ENCOUNTER — Ambulatory Visit (INDEPENDENT_AMBULATORY_CARE_PROVIDER_SITE_OTHER): Payer: Medicare PPO

## 2019-10-21 DIAGNOSIS — Z0000 Encounter for general adult medical examination without abnormal findings: Secondary | ICD-10-CM

## 2019-10-21 DIAGNOSIS — Z780 Asymptomatic menopausal state: Secondary | ICD-10-CM | POA: Diagnosis not present

## 2019-10-21 NOTE — Patient Instructions (Signed)
Paula Stein , Thank you for taking time to come for your Medicare Wellness Visit. I appreciate your ongoing commitment to your health goals. Please review the following plan we discussed and let me know if I can assist you in the future.   Screening recommendations/referrals: Colonoscopy:  Mammogram: Up to date, 01/24/2019 Bone Density: Due, ordered this visit  Recommended yearly ophthalmology/optometry visit for glaucoma screening and checkup Recommended yearly dental visit for hygiene and checkup  Vaccinations: Influenza vaccine: up to date, next due 12/24/2018 Pneumococcal vaccine: completed series  Tdap vaccine: up to date, next due 06/26/2013 Shingles vaccine: Due, you may check with your pharmacy on the cost     Advanced directives: Please bring in a copy of your advanced directives at your next visit  Conditions/risks identified: Try to exercise for at least 30 minutes a day 3 days per week.  Next appointment: 12/15/2019 @ 9:00 am with Dr. Volanda Napoleon   Preventive Care 65 Years and Older, Female Preventive care refers to lifestyle choices and visits with your health care provider that can promote health and wellness. What does preventive care include?  A yearly physical exam. This is also called an annual well check.  Dental exams once or twice a year.  Routine eye exams. Ask your health care provider how often you should have your eyes checked.  Personal lifestyle choices, including:  Daily care of your teeth and gums.  Regular physical activity.  Eating a healthy diet.  Avoiding tobacco and drug use.  Limiting alcohol use.  Practicing safe sex.  Taking low-dose aspirin every day.  Taking vitamin and mineral supplements as recommended by your health care provider. What happens during an annual well check? The services and screenings done by your health care provider during your annual well check will depend on your age, overall health, lifestyle risk factors, and  family history of disease. Counseling  Your health care provider may ask you questions about your:  Alcohol use.  Tobacco use.  Drug use.  Emotional well-being.  Home and relationship well-being.  Sexual activity.  Eating habits.  History of falls.  Memory and ability to understand (cognition).  Work and work Statistician.  Reproductive health. Screening  You may have the following tests or measurements:  Height, weight, and BMI.  Blood pressure.  Lipid and cholesterol levels. These may be checked every 5 years, or more frequently if you are over 6 years old.  Skin check.  Lung cancer screening. You may have this screening every year starting at age 17 if you have a 30-pack-year history of smoking and currently smoke or have quit within the past 15 years.  Fecal occult blood test (FOBT) of the stool. You may have this test every year starting at age 24.  Flexible sigmoidoscopy or colonoscopy. You may have a sigmoidoscopy every 5 years or a colonoscopy every 10 years starting at age 16.  Hepatitis C blood test.  Hepatitis B blood test.  Sexually transmitted disease (STD) testing.  Diabetes screening. This is done by checking your blood sugar (glucose) after you have not eaten for a while (fasting). You may have this done every 1-3 years.  Bone density scan. This is done to screen for osteoporosis. You may have this done starting at age 40.  Mammogram. This may be done every 1-2 years. Talk to your health care provider about how often you should have regular mammograms. Talk with your health care provider about your test results, treatment options, and if necessary,  the need for more tests. Vaccines  Your health care provider may recommend certain vaccines, such as:  Influenza vaccine. This is recommended every year.  Tetanus, diphtheria, and acellular pertussis (Tdap, Td) vaccine. You may need a Td booster every 10 years.  Zoster vaccine. You may need this  after age 46.  Pneumococcal 13-valent conjugate (PCV13) vaccine. One dose is recommended after age 44.  Pneumococcal polysaccharide (PPSV23) vaccine. One dose is recommended after age 12. Talk to your health care provider about which screenings and vaccines you need and how often you need them. This information is not intended to replace advice given to you by your health care provider. Make sure you discuss any questions you have with your health care provider. Document Released: 04/16/2015 Document Revised: 12/08/2015 Document Reviewed: 01/19/2015 Elsevier Interactive Patient Education  2017 [place] Prevention in the Home Falls can cause injuries. They can happen to people of all ages. There are many things you can do to make your home safe and to help prevent falls. What can I do on the outside of my home?  Regularly fix the edges of walkways and driveways and fix any cracks.  Remove anything that might make you trip as you walk through a door, such as a raised step or threshold.  Trim any bushes or trees on the path to your home.  Use bright outdoor lighting.  Clear any walking paths of anything that might make someone trip, such as rocks or tools.  Regularly check to see if handrails are loose or broken. Make sure that both sides of any steps have handrails.  Any raised decks and porches should have guardrails on the edges.  Have any leaves, snow, or ice cleared regularly.  Use sand or salt on walking paths during winter.  Clean up any spills in your garage right away. This includes oil or grease spills. What can I do in the bathroom?  Use night lights.  Install grab bars by the toilet and in the tub and shower. Do not use towel bars as grab bars.  Use non-skid mats or decals in the tub or shower.  If you need to sit down in the shower, use a plastic, non-slip stool.  Keep the floor dry. Clean up any water that spills on the floor as soon as it  happens.  Remove soap buildup in the tub or shower regularly.  Attach bath mats securely with double-sided non-slip rug tape.  Do not have throw rugs and other things on the floor that can make you trip. What can I do in the bedroom?  Use night lights.  Make sure that you have a light by your bed that is easy to reach.  Do not use any sheets or blankets that are too big for your bed. They should not hang down onto the floor.  Have a firm chair that has side arms. You can use this for support while you get dressed.  Do not have throw rugs and other things on the floor that can make you trip. What can I do in the kitchen?  Clean up any spills right away.  Avoid walking on wet floors.  Keep items that you use a lot in easy-to-reach places.  If you need to reach something above you, use a strong step stool that has a grab bar.  Keep electrical cords out of the way.  Do not use floor polish or wax that makes floors slippery. If you must use  wax, use non-skid floor wax.  Do not have throw rugs and other things on the floor that can make you trip. What can I do with my stairs?  Do not leave any items on the stairs.  Make sure that there are handrails on both sides of the stairs and use them. Fix handrails that are broken or loose. Make sure that handrails are as long as the stairways.  Check any carpeting to make sure that it is firmly attached to the stairs. Fix any carpet that is loose or worn.  Avoid having throw rugs at the top or bottom of the stairs. If you do have throw rugs, attach them to the floor with carpet tape.  Make sure that you have a light switch at the top of the stairs and the bottom of the stairs. If you do not have them, ask someone to add them for you. What else can I do to help prevent falls?  Wear shoes that:  Do not have high heels.  Have rubber bottoms.  Are comfortable and fit you well.  Are closed at the toe. Do not wear sandals.  If you  use a stepladder:  Make sure that it is fully opened. Do not climb a closed stepladder.  Make sure that both sides of the stepladder are locked into place.  Ask someone to hold it for you, if possible.  Clearly mark and make sure that you can see:  Any grab bars or handrails.  First and last steps.  Where the edge of each step is.  Use tools that help you move around (mobility aids) if they are needed. These include:  Canes.  Walkers.  Scooters.  Crutches.  Turn on the lights when you go into a dark area. Replace any light bulbs as soon as they burn out.  Set up your furniture so you have a clear path. Avoid moving your furniture around.  If any of your floors are uneven, fix them.  If there are any pets around you, be aware of where they are.  Review your medicines with your doctor. Some medicines can make you feel dizzy. This can increase your chance of falling. Ask your doctor what other things that you can do to help prevent falls. This information is not intended to replace advice given to you by your health care provider. Make sure you discuss any questions you have with your health care provider. Document Released: 01/14/2009 Document Revised: 08/26/2015 Document Reviewed: 04/24/2014 Elsevier Interactive Patient Education  2017 [employer].

## 2019-10-21 NOTE — Progress Notes (Addendum)
Subjective:   Paula Stein is a 74 y.o. female who presents for Medicare Annual (Subsequent) preventive examination.  I connected with [name]  today by telephone and verified that I am speaking with the correct person using two identifiers. Location patient: home Location provider: work Persons participating in the virtual visit: patient, provider.   I discussed the limitations, risks, security and privacy concerns of performing an evaluation and management service by telephone and the availability of in person appointments. I also discussed with the patient that there may be a patient responsible charge related to this service. The patient expressed understanding and verbally consented to this telephonic visit.    Interactive audio and video telecommunications were attempted between this provider and patient, however failed, due to patient having technical difficulties OR patient did not have access to video capability.  We continued and completed visit with audio only.      Review of Systems    N/A  Cardiac Risk Factors include: advanced age (>97men, >72 women);diabetes mellitus;dyslipidemia;hypertension     Objective:    Today's Vitals   There is no height or weight on file to calculate BMI.  Advanced Directives 10/21/2019 03/13/2018 03/13/2018 03/13/2018 09/04/2017 09/04/2017 05/22/2017  Does Patient Have a Medical Advance Directive? Yes Yes Yes Yes Yes Yes Yes  Type of Paramedic of [place];Living will Healthcare Power of [place];Living will [place];Living will - - Living will  Does patient want to make changes to medical advance directive? No - Patient declined - No - Patient declined - - - No - Patient declined  Copy of [place] in Chart? No - copy requested - - - - - -  Would patient like information on creating a medical advance directive? - - - - - - -    Current  Medications (verified) Outpatient Encounter Medications as of 10/21/2019  Medication Sig  . acetaminophen (TYLENOL) 500 MG tablet Take 1 tablet (500 mg total) by mouth every 6 (six) hours as needed. (Patient taking differently: Take 500 mg by mouth every 6 (six) hours as needed for mild pain. )  . allopurinol (ZYLOPRIM) 100 MG tablet Take 100 mg by mouth daily.  Marland Kitchen aspirin 81 MG tablet Take 81 mg by mouth daily.    Marland Kitchen atorvastatin (LIPITOR) 40 MG tablet TAKE 1 AND 1/2 TABLETS(60 MG) BY MOUTH DAILY (Patient taking differently: Take 50 mg by mouth daily. )  . carvedilol (COREG) 25 MG tablet TAKE 1 TABLET BY MOUTH TWICE A DAY WITH FOOD (Patient taking differently: Take 25 mg by mouth 2 (two) times daily with a meal. )  . furosemide (LASIX) 40 MG tablet TAKE 1 TABLET BY MOUTH TWICE DAILY (Patient taking differently: Take 40 mg by mouth 2 (two) times daily. )  . glipiZIDE (GLUCOTROL XL) 2.5 MG 24 hr tablet Take 1 tablet (2.5 mg total) by mouth daily with breakfast.  . glucose blood (ONETOUCH VERIO) test strip 1 each by Other route See admin instructions. Use as instructed to check blood sugar once a day  . isosorbide mononitrate (IMDUR) 30 MG 24 hr tablet TAKE 1 TABLET(30 MG) BY MOUTH DAILY (Patient taking differently: Take 30 mg by mouth daily. )  . levothyroxine (SYNTHROID) 150 MCG tablet Take 1 tablet (150 mcg total) by mouth daily before breakfast.  . OneTouch Delica Lancets 93O MISC 1 Device by Other route daily.  . sacubitril-valsartan (ENTRESTO) 49-51 MG Take 1 tablet by  mouth 2 (two) times daily.  Marland Kitchen spironolactone (ALDACTONE) 50 MG tablet TAKE 1 TABLET(50 MG) BY MOUTH DAILY (Patient taking differently: Take 50 mg by mouth daily. )  . hydroxychloroquine (PLAQUENIL) 200 MG tablet Take 1 tablet by mouth 2 (two) times daily. (Patient not taking: Reported on 10/21/2019)  . omeprazole (PRILOSEC) 40 MG capsule Take 1 capsule (40 mg total) by mouth 2 (two) times daily before a meal. (Patient not taking:  Reported on 10/21/2019)  . [DISCONTINUED] ondansetron (ZOFRAN) 4 MG tablet Take 1 tablet (4 mg total) by mouth every 6 (six) hours.   No facility-administered encounter medications on file as of 10/21/2019.    Allergies (verified) Ibuprofen   History: Past Medical History:  Diagnosis Date  . AICD (automatic cardioverter/defibrillator) present 10/08/2014   SUBQ    /    DR [name]  . CHF (congestive heart failure) ([place])   . Diabetes mellitus without complication ([place])   . History of cardiac cath 05/2007   normal-with patent coronaries  . History of colonoscopy   . History of hiatal hernia   . Hypertension   . Iatrogenic thyroiditis   . Non-ischemic cardiomyopathy ([place])    EF 28%- reassessment of LV function 2011 with LVEf 45-50%  . PAD (peripheral artery disease) ([facility])    lower extremities with ABIs of 0.5 bilaterally  . Personal history of goiter   . S/P radioactive iodine thyroid ablation   . S/P thyroidectomy   . Shortness of breath dyspnea    Past Surgical History:  Procedure Laterality Date  . EP IMPLANTABLE DEVICE N/A 10/08/2014   Procedure: SubQ ICD Implant;  Surgeon: [name], MD;  Location: [facility];  Service: Cardiovascular;  Laterality: N/A;  . THYROIDECTOMY    . TONSILLECTOMY    . TOTAL ABDOMINAL HYSTERECTOMY     Family History  Problem Relation Age of Onset  . Diabetes type II Mother   . Hypertension Mother   . Diabetes Mother   . Heart disease Mother   . Other Father        deceased from accident age 23  . Hyperlipidemia Father   . Breast cancer Neg Hx   . Stomach cancer Neg Hx   . Colon cancer Neg Hx   . Esophageal cancer Neg Hx   . Pancreatic cancer Neg Hx    Social History   Socioeconomic History  . Marital status: Divorced    Spouse name: Not on file  . Number of children: 1  . Years of education: Not on file  . Highest education level: Not on file  Occupational History  . Occupation: Retired    Fish farm manager: RETIRED    CommentLobbyist  Tobacco Use  . Smoking status: Light Tobacco Smoker    Packs/day: 0.25    Years: 40.00    Pack years: 10.00    Types: Cigarettes  . Smokeless tobacco: Never Used  . Tobacco comment: on and off   Vaping Use  . Vaping Use: Never used  Substance and Sexual Activity  . Alcohol use: Yes    Alcohol/week: 0.0 standard drinks    Comment: rare glass of wine  . Drug use: No  . Sexual activity: Not Currently    Birth control/protection: None  Other Topics Concern  . Not on file  Social History Narrative   Retired Education officer, museum   Divorced - one grown son   current smoker    Alcohol use-no  Drug use-no    Regular exercise- no     son - Beaulah Romanek   Social Determinants of Health   Financial Resource Strain: Low Risk   . Difficulty of Paying Living Expenses: Not hard at all  Food Insecurity:   . Worried About Charity fundraiser in the Last Year:   . Arboriculturist in the Last Year:   Transportation Needs: No Transportation Needs  . Lack of Transportation (Medical): No  . Lack of Transportation (Non-Medical): No  Physical Activity: Inactive  . Days of Exercise per Week: 0 days  . Minutes of Exercise per Session: 0 min  Stress: No Stress Concern Present  . Feeling of Stress : Not at all  Social Connections: Moderately Integrated  . Frequency of Communication with Friends and Family: More than three times a week  . Frequency of Social Gatherings with Friends and Family: More than three times a week  . Attends Religious Services: More than 4 times per year  . Active Member of Clubs or Organizations: Yes  . Attends Archivist Meetings: 1 to 4 times per year  . Marital Status: Divorced    Tobacco Counseling Ready to quit: Yes Counseling given: Not Answered Comment: on and off    Clinical Intake:  Pre-visit preparation completed: Yes  Pain : No/denies pain     Nutritional Risks: None Diabetes: Yes CBG done?: No Did pt. bring in CBG  monitor from home?: No (Check glucose once every 3 days)  What is the last grade level you completed in school?: 3 years college  Diabetic?Yes   Interpreter Needed?: No  Information entered by :: [facility] In your present state of health, do you have any difficulty performing the following activities: 10/21/2019  Hearing? Y  Comment Wears hearing aids  Vision? N  Difficulty concentrating or making decisions? N  Walking or climbing stairs? N  Dressing or bathing? N  Doing errands, shopping? N  Preparing Food and eating ? N  Using the Toilet? N  In the past six months, have you accidently leaked urine? N  Do you have problems with loss of bowel control? N  Managing your Medications? N  Managing your Finances? N  Housekeeping or managing your Housekeeping? N  Some recent data might be hidden    Patient Care Team: [name], MD as PCP - General (Family Medicine) [name], MD as PCP - Cardiology (Cardiology) [name], MD as PCP - Electrophysiology (Cardiology)  Indicate any recent Medical Services you may have received from other than [facility] providers in the past year (date may be approximate).     Assessment:   This is a routine wellness examination for Zyrah.  Hearing/Vision screen  Hearing Screening   125Hz  250Hz  500Hz  1000Hz  2000Hz  3000Hz  4000Hz  6000Hz  8000Hz   Right ear:           Left ear:           Vision Screening Comments: Gets annual eye exam  Dietary issues and exercise activities discussed: Current Exercise Habits: The patient does not participate in regular exercise at present, Exercise limited by: None identified  Goals    . Exercise 150 minutes per week (moderate activity)     Plan is to join grand-dtr [facility] [place] [facility] [facility] By all means, start doing line dancing    . Patient Stated     Insomnia Insomnia is  a sleep disorder that makes it difficult to fall asleep or to stay  asleep. Insomnia can cause tiredness (fatigue), low energy, difficulty concentrating, mood swings, and poor performance at work or school. There are three different ways to classify insomnia:  Difficulty falling asleep.  Difficulty staying asleep.  Waking up too early in the morning.  Any type of insomnia can be long-term (chronic) or short-term (acute). Both are common. Short-term insomnia usually lasts for three months or less. Chronic insomnia occurs at least three times a week for longer than three months. What are the causes? Insomnia may be caused by another condition, situation, or substance, such as:  Anxiety.  Certain medicines.  Gastroesophageal reflux disease (GERD) or other gastrointestinal conditions.  Asthma or other breathing conditions.  Restless legs syndrome, sleep apnea, or other sleep disorders.  Chronic pain.  Menopause. This may include hot flashes.  Stroke.  Abuse of alcohol, tobacco, or illegal drugs.  Depression.  Caffeine.  Neurological disorders, such as Alzheimer disease.  An overactive thyroid (hyperthyroidism).  The cause of insomnia may not be known. What increases the risk? Risk factors for insomnia include:  Gender. Women are more commonly affected than men.  Age. Insomnia is more common as you get older.  Stress. This may involve your professional or personal life.  Income. Insomnia is more common in people with lower income.  Lack of exercise.  Irregular work schedule or night shifts.  Traveling between different time zones.  What are the signs or symptoms? If you have insomnia, trouble falling asleep or trouble staying asleep is the main symptom. This may lead to other symptoms, such as:  Feeling fatigued.  Feeling nervous about going to sleep.  Not feeling rested in the morning.  Having trouble concentrating.  Feeling irritable, anxious, or depressed.  How is this treated? Treatment for insomnia depends on the  cause. If your insomnia is caused by an underlying condition, treatment will focus on addressing the condition. Treatment may also include:  Medicines to help you sleep.  Counseling or therapy.  Lifestyle adjustments.  Follow these instructions at home:  Take medicines only as directed by your health care provider.  Keep regular sleeping and waking hours. Avoid naps.  Keep a sleep diary to help you and your health care provider figure out what could be causing your insomnia. Include: ? When you sleep. ? When you wake up during the night. ? How well you sleep. ? How rested you feel the next day. ? Any side effects of medicines you are taking. ? What you eat and drink.  Make your bedroom a comfortable place where it is easy to fall asleep: ? Put up shades or special blackout curtains to block light from outside. ? Use a white noise machine to block noise. ? Keep the temperature cool.  Exercise regularly as directed by your health care provider. Avoid exercising right before bedtime.  Use relaxation techniques to manage stress. Ask your health care provider to suggest some techniques that may work well for you. These may include: ? Breathing exercises. ? Routines to release muscle tension. ? Visualizing peaceful scenes.  Cut back on alcohol, caffeinated beverages, and cigarettes, especially close to bedtime. These can disrupt your sleep.  Do not overeat or eat spicy foods right before bedtime. This can lead to digestive discomfort that can make it hard for you to sleep.  Limit screen use before bedtime. This includes: ? Watching TV. ? Using your smartphone, tablet, and computer.  Stick to a routine. This can help you fall asleep faster. Try to do a quiet activity, brush your teeth, and go to bed at the same time each night.  Get out of bed if you are still awake after 15 minutes of trying to sleep. Keep the lights down, but try reading or doing a quiet activity. When you feel  sleepy, go back to bed.  Make sure that you drive carefully. Avoid driving if you feel very sleepy.  Keep all follow-up appointments as directed by your health care provider. This is important. Contact a health care provider if:  You are tired throughout the day or have trouble in your daily routine due to sleepiness.  You continue to have sleep problems or your sleep problems get worse. Get help right away if:  You have serious thoughts about hurting yourself or someone else. This information is not intended to replace advice given to you by your health care provider. Make sure you discuss any questions you have with your health care provider. Document Released: 03/17/2000 Document Revised: 08/20/2015 Document Reviewed: 12/19/2013 Elsevier Interactive Patient Education  2018 [employer].     . Patient Stated     I will continue to take my medications as prescribed       Depression Screen PHQ 2/9 Scores 10/21/2019 09/04/2017 08/21/2016 06/12/2016 09/09/2015 06/02/2015 07/13/2014  PHQ - 2 Score 0 0 0 0 0 0 0  PHQ- 9 Score 0 - - - - - -    Fall Risk Fall Risk  10/21/2019 09/04/2017 06/12/2016 09/09/2015 06/02/2015  Falls in the past year? 0 No No No No  Number falls in past yr: 0 - - - -  Injury with Fall? 0 - - - -  Risk for fall due to : Medication side effect - - - -  Follow up Falls evaluation completed;Falls prevention discussed - - - -    Any stairs in or around the home? Yes  If so, are there any without handrails? No  Home free of loose throw rugs in walkways, pet beds, electrical cords, etc? Yes  Adequate lighting in your home to reduce risk of falls? Yes   ASSISTIVE DEVICES UTILIZED TO PREVENT FALLS:  Life alert? No  Use of a cane, walker or w/c? No  Grab bars in the bathroom? No  Shower chair or bench in shower? No  Elevated toilet seat or a handicapped toilet? No    Cognitive Function: MMSE - Mini Mental State Exam 09/04/2017 08/31/2016  Not completed: (No Data) (No Data)       6CIT Screen 10/21/2019  What Year? 0 points  What month? 0 points  What time? 0 points  Count back from 20 0 points  Months in reverse 0 points  Repeat phrase 0 points  Total Score 0    Immunizations Immunization History  Administered Date(s) Administered  . Fluad Quad(high Dose 65+) 12/24/2018  . Influenza Split 02/15/2011  . Influenza Whole 08/22/2004, 02/18/2007, 12/31/2008, 02/17/2010, 01/02/2012  . Influenza, High Dose Seasonal PF 01/04/2015, 12/11/2016, 01/03/2018  . Influenza,inj,Quad PF,6+ Mos 06/26/2013, 02/03/2014  . Influenza-Unspecified 02/02/2016  . Moderna SARS-COVID-2 Vaccination 05/05/2019, 06/02/2019  . Pneumococcal Conjugate-13 06/26/2013  . Pneumococcal Polysaccharide-23 08/22/2004, 02/03/2014  . Td 12/22/2002  . Tdap 06/26/2013  . Zoster 07/24/2014    TDAP status: Up to date Flu Vaccine status: Up to date Pneumococcal vaccine status: Up to date Covid-19 vaccine status: Completed vaccines  Qualifies for Shingles Vaccine? Yes   Zostavax  completed Yes   Shingrix Completed?: No.    Education has been provided regarding the importance of this vaccine. Patient has been advised to call insurance company to determine out of pocket expense if they have not yet received this vaccine. Advised may also receive vaccine at local pharmacy or Health Dept. Verbalized acceptance and understanding.  Screening Tests Health Maintenance  Topic Date Due  . OPHTHALMOLOGY EXAM  08/29/2018  . FOOT EXAM  01/04/2019  . INFLUENZA VACCINE  11/02/2019  . HEMOGLOBIN A1C  01/13/2020  . TETANUS/TDAP  06/27/2023  . DEXA SCAN  Completed  . COVID-19 Vaccine  Completed  . Hepatitis C Screening  Completed  . PNA vac Low Risk Adult  Completed    Health Maintenance  Health Maintenance Due  Topic Date Due  . OPHTHALMOLOGY EXAM  08/29/2018  . FOOT EXAM  01/04/2019    Colorectal cancer screening: Completed cologuard on 08/29/2017 . Repeat every 3 years Mammogram status:  Completed 01/24/2019. Repeat every year Bone Density status: Completed 09/10/2016. Results reflect: Bone density results: NORMAL. Repeat every 2 years.  Lung Cancer Screening: (Low Dose CT Chest recommended if Age 40-80 years, 30 pack-year currently smoking OR have quit w/in 15years.) does not qualify.   Lung Cancer Screening Referral: N/A  Additional Screening:  Hepatitis C Screening: does qualify; Completed 06/02/2015  Vision Screening: Recommended annual ophthalmology exams for early detection of glaucoma and other disorders of the eye. Is the patient up to date with their annual eye exam?  Yes  Who is the provider or what is the name of the office in which the patient attends annual eye exams? [facility] If pt is not established with a provider, would they like to be referred to a provider to establish care? No .   Dental Screening: Recommended annual dental exams for proper oral hygiene  Community Resource Referral / Chronic Care Management: CRR required this visit?  No   CCM required this visit?  No      Plan:     I have personally reviewed and noted the following in the patient's chart:   . Medical and social history . Use of alcohol, tobacco or illicit drugs  . Current medications and supplements . Functional ability and status . Nutritional status . Physical activity . Advanced directives . List of other physicians . Hospitalizations, surgeries, and ER visits in previous 12 months . Vitals . Screenings to include cognitive, depression, and falls . Referrals and appointments  In addition, I have reviewed and discussed with patient certain preventive protocols, quality metrics, and best practice recommendations. A written personalized care plan for preventive services as well as general preventive health recommendations were provided to patient.     Ofilia Neas, LPN   [id number]   Nurse Notes: Patient is due for her diabetic foot exam at next visit.  She would also like to discuss getting nicotine patches to help with smoking cessation

## 2019-10-23 ENCOUNTER — Other Ambulatory Visit: Payer: Self-pay

## 2019-10-23 ENCOUNTER — Ambulatory Visit (INDEPENDENT_AMBULATORY_CARE_PROVIDER_SITE_OTHER): Payer: Medicare PPO | Admitting: Internal Medicine

## 2019-10-23 ENCOUNTER — Encounter: Payer: Self-pay | Admitting: *Deleted

## 2019-10-23 ENCOUNTER — Other Ambulatory Visit: Payer: Self-pay | Admitting: Family Medicine

## 2019-10-23 ENCOUNTER — Encounter: Payer: Self-pay | Admitting: Internal Medicine

## 2019-10-23 VITALS — BP 178/90 | HR 84 | Ht 70.0 in | Wt 221.0 lb

## 2019-10-23 DIAGNOSIS — I428 Other cardiomyopathies: Secondary | ICD-10-CM

## 2019-10-23 DIAGNOSIS — I10 Essential (primary) hypertension: Secondary | ICD-10-CM | POA: Diagnosis not present

## 2019-10-23 DIAGNOSIS — I5042 Chronic combined systolic (congestive) and diastolic (congestive) heart failure: Secondary | ICD-10-CM | POA: Diagnosis not present

## 2019-10-23 DIAGNOSIS — E2839 Other primary ovarian failure: Secondary | ICD-10-CM

## 2019-10-23 DIAGNOSIS — Z01812 Encounter for preprocedural laboratory examination: Secondary | ICD-10-CM | POA: Diagnosis not present

## 2019-10-23 DIAGNOSIS — Z95810 Presence of automatic (implantable) cardiac defibrillator: Secondary | ICD-10-CM

## 2019-10-23 NOTE — Patient Instructions (Addendum)
Medication Instructions:  - Your physician recommends that you continue on your current medications as directed. Please refer to the Current Medication list given to you today.  *If you need a refill on your cardiac medications before your next appointment, please call your pharmacy*   Lab Work: 1) Do this 1st- Pre- procedure lab work: Monday 11/10/19 (7:30 am- 12:30 pm) - come in to the [place] entrance at [facility]  - 1st desk on the right to check in (Registration), which is just past the screening table   2) Do this 2nd- Pre-procedure COVID swab: Monday 11/10/19 (8:00 am - 1:00 pm) - Drive up to the [employer] at [facility] - staff will come out to the car to swab you    If you have labs (blood work) drawn today and your tests are completely normal, you will receive your results only by: Marland Kitchen MyChart Message (if you have MyChart) OR . A paper copy in the mail If you have any lab test that is abnormal or we need to change your treatment, we will call you to review the results.   Testing/Procedures: - Your physician has recommended that you have a defibrillator generator (battery) change (see attached instruction letter)   Follow-Up: At [facility], you and your health needs are our priority.  As part of our continuing mission to provide you with exceptional heart care, we have created designated Provider Care Teams.  These Care Teams include your primary Cardiologist (physician) and Advanced Practice Providers (APPs -  Physician Assistants and Nurse Practitioners) who all work together to provide you with the care you need, when you need it.  We recommend signing up for the patient portal called "MyChart".  Sign up information is provided on this After Visit Summary.  MyChart is used to connect with patients for Virtual Visits (Telemedicine).  Patients are able to view lab/test results, encounter notes, upcoming appointments, etc.  Non-urgent messages can be sent to your provider  as well.   To learn more about what you can do with MyChart, go to [email or web address].    Your next appointment:   1) 10-14 days (from 11/12/19) with the [facility] in [place]  2) 91 days (from 11/12/19) with Dr. [name] in [place]  The format for your next appointment:   In Person  Provider:   as above   Other Instructions n/a

## 2019-10-23 NOTE — Progress Notes (Signed)
Patient Care Team: [name], MD as PCP - General (Family Medicine) [name], MD as PCP - Cardiology (Cardiology) [name], MD as PCP - Electrophysiology (Cardiology)   HPI  Paula Stein is a 74 y.o. 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  She is here today because of her class I advisory recall for early battery depletion has unfortunately become manifest.   She continues with shortness of breath.  No chest pain or edema.  No palpitations or lightheadedness.      DATE TEST EF   4/16 Echo  30-35 % Severe LVH/LAE  2/19 Echo 25-30% Severe LVH  3/20 Echo  30-35%     Date Cr K TSH  12/17 1.02 4.4 6.34  9/18 1.22 4.4 39.32  6/19 0.99 4.3 2.78  6/20  1.48 4.1 (12/19) 4.6  5/21 1.21 4.2 0.06       hyperthyroidism s/p RAI ablation, diabetes, recent down titration 5/21  Past Medical History:  Diagnosis Date  . AICD (automatic cardioverter/defibrillator) present 10/08/2014   SUBQ    /    DR [name]  . CHF (congestive heart failure) ([place])   . Diabetes mellitus without complication ([place])   . History of cardiac cath 05/2007   normal-with patent coronaries  . History of colonoscopy   . History of hiatal hernia   . Hypertension   . Iatrogenic thyroiditis   . Non-ischemic cardiomyopathy ([place])    EF 28%- reassessment of LV function 2011 with LVEf 45-50%  . PAD (peripheral artery disease) ([facility])    lower extremities with ABIs of 0.5 bilaterally  . Personal history of goiter   . S/P radioactive iodine thyroid ablation   . S/P thyroidectomy   . Shortness of breath dyspnea     Past Surgical History:  Procedure Laterality Date  . EP IMPLANTABLE DEVICE N/A 10/08/2014   Procedure: SubQ ICD Implant;  Surgeon: [name], MD;  Location: [facility];  Service: Cardiovascular;  Laterality: N/A;   . THYROIDECTOMY    . TONSILLECTOMY    . TOTAL ABDOMINAL HYSTERECTOMY      Current Outpatient Medications  Medication Sig Dispense Refill  . acetaminophen (TYLENOL) 500 MG tablet Take 1 tablet (500 mg total) by mouth every 6 (six) hours as needed. (Patient taking differently: Take 500 mg by mouth every 6 (six) hours as needed for mild pain. ) 30 tablet 0  . allopurinol (ZYLOPRIM) 100 MG tablet Take 100 mg by mouth daily.    Marland Kitchen aspirin 81 MG tablet Take 81 mg by mouth daily.      Marland Kitchen atorvastatin (LIPITOR) 40 MG tablet TAKE 1 AND 1/2 TABLETS(60 MG) BY MOUTH DAILY (Patient taking differently: Take 50 mg by mouth daily. ) 135 tablet 0  . carvedilol (COREG) 25 MG tablet TAKE 1 TABLET BY MOUTH TWICE A DAY WITH FOOD (Patient taking differently: Take 25 mg by mouth 2 (two) times daily with a meal. ) 180 tablet 2  . furosemide (LASIX) 40 MG tablet TAKE 1 TABLET BY MOUTH TWICE DAILY (Patient taking differently: Take 40 mg by mouth 2 (two) times daily. ) 180 tablet 1  . glipiZIDE (GLUCOTROL XL) 2.5 MG 24 hr tablet Take 1 tablet (2.5 mg total) by mouth daily with breakfast. 30 tablet 3  . glucose blood (ONETOUCH VERIO) test strip 1 each  by Other route See admin instructions. Use as instructed to check blood sugar once a day 100 strip 1  . hydroxychloroquine (PLAQUENIL) 200 MG tablet Take 1 tablet by mouth 2 (two) times daily.   3  . isosorbide mononitrate (IMDUR) 30 MG 24 hr tablet TAKE 1 TABLET(30 MG) BY MOUTH DAILY (Patient taking differently: Take 30 mg by mouth daily. ) 90 tablet 2  . levothyroxine (SYNTHROID) 150 MCG tablet Take 1 tablet (150 mcg total) by mouth daily before breakfast. 30 tablet 3  . omeprazole (PRILOSEC) 40 MG capsule Take 1 capsule (40 mg total) by mouth 2 (two) times daily before a meal. 60 capsule 2  . OneTouch Delica Lancets 65V MISC 1 Device by Other route daily. 100 each 1  . sacubitril-valsartan (ENTRESTO) 49-51 MG Take 1 tablet by mouth 2 (two) times daily. 60 tablet 10  .  spironolactone (ALDACTONE) 50 MG tablet TAKE 1 TABLET(50 MG) BY MOUTH DAILY (Patient taking differently: Take 50 mg by mouth daily. ) 90 tablet 3   No current facility-administered medications for this visit.    Allergies  Allergen Reactions  . Ibuprofen Nausea Only      Review of Systems negative except from HPI and PMH  Physical Exam BP (!) 178/90 (BP Location: Left Arm, Patient Position: Sitting, Cuff Size: Normal) Comment: Ran across the parking lot  Pulse 84   Ht 5\' 10"  (1.778 m)   Wt 221 lb (100.2 kg)   SpO2 98%   BMI 31.71 kg/m  Well developed and well nourished in no acute distress HENT normal Neck supple with JVP-flat Clear Device pocket well healed; without hematoma or erythema.  There is no tethering  Regular rate and rhythm, no  gallop No  murmur Abd-soft with active BS No Clubbing cyanosis   edema Skin-warm and dry A & Oriented  Grossly normal sensory and motor function  ECG sinus at 84 Interval 23/11/38 Nonspecific ST-T changes        Assessment and  Plan   Nonischemic cardiomyopathy  Left ventricular hypertrophy-severe  CHF  Chronic systolic  ICD-Subcutaneous       Class 1 recall Generator premature depletion  Obesity  Hypertension  End of life discussion   High Risk Medication Surveillance aldactone    Euvolemic continue current meds  She comes in today to discuss device generator replacement or not in the context of her early battery depletion.  We have discussed end-of-life issues and modes of dying, sudden death versus nonsudden death, the lack of utility evident as a primary implant for nonischemic cardiomyopathy, the attendant risks of infection with device generator replacement procedures.  She has discussed this with her family.  Realizing that there was an outpatient procedure, she is elected to proceed with device generator replacement.  We have reviewed the benefits and risks of generator replacement.  These include but  are not limited to lead fracture and infection.  The patient understands, agrees and is willing to proceed.    Reviewing her echo reports there is variable left ventricular hypertrophy.  Given the potential familial implications, recommend cMRI to look for evidence of HCM

## 2019-10-23 NOTE — H&P (View-Only) (Signed)
Patient Care Team: [name], MD as PCP - General (Family Medicine) [name], MD as PCP - Cardiology (Cardiology) [name], MD as PCP - Electrophysiology (Cardiology)   HPI  Paula Stein is a 74 y.o. 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  She is here today because of her class I advisory recall for early battery depletion has unfortunately become manifest.   She continues with shortness of breath.  No chest pain or edema.  No palpitations or lightheadedness.      DATE TEST EF   4/16 Echo  30-35 % Severe LVH/LAE  2/19 Echo 25-30% Severe LVH  3/20 Echo  30-35%     Date Cr K TSH  12/17 1.02 4.4 6.34  9/18 1.22 4.4 39.32  6/19 0.99 4.3 2.78  6/20  1.48 4.1 (12/19) 4.6  5/21 1.21 4.2 0.06       hyperthyroidism s/p RAI ablation, diabetes, recent down titration 5/21  Past Medical History:  Diagnosis Date  . AICD (automatic cardioverter/defibrillator) present 10/08/2014   SUBQ    /    DR [name]  . CHF (congestive heart failure) ([place])   . Diabetes mellitus without complication ([place])   . History of cardiac cath 05/2007   normal-with patent coronaries  . History of colonoscopy   . History of hiatal hernia   . Hypertension   . Iatrogenic thyroiditis   . Non-ischemic cardiomyopathy ([place])    EF 28%- reassessment of LV function 2011 with LVEf 45-50%  . PAD (peripheral artery disease) ([facility])    lower extremities with ABIs of 0.5 bilaterally  . Personal history of goiter   . S/P radioactive iodine thyroid ablation   . S/P thyroidectomy   . Shortness of breath dyspnea     Past Surgical History:  Procedure Laterality Date  . EP IMPLANTABLE DEVICE N/A 10/08/2014   Procedure: SubQ ICD Implant;  Surgeon: [name], MD;  Location: [facility];  Service: Cardiovascular;  Laterality: N/A;    . THYROIDECTOMY    . TONSILLECTOMY    . TOTAL ABDOMINAL HYSTERECTOMY      Current Outpatient Medications  Medication Sig Dispense Refill  . acetaminophen (TYLENOL) 500 MG tablet Take 1 tablet (500 mg total) by mouth every 6 (six) hours as needed. (Patient taking differently: Take 500 mg by mouth every 6 (six) hours as needed for mild pain. ) 30 tablet 0  . allopurinol (ZYLOPRIM) 100 MG tablet Take 100 mg by mouth daily.    Marland Kitchen aspirin 81 MG tablet Take 81 mg by mouth daily.      Marland Kitchen atorvastatin (LIPITOR) 40 MG tablet TAKE 1 AND 1/2 TABLETS(60 MG) BY MOUTH DAILY (Patient taking differently: Take 50 mg by mouth daily. ) 135 tablet 0  . carvedilol (COREG) 25 MG tablet TAKE 1 TABLET BY MOUTH TWICE A DAY WITH FOOD (Patient taking differently: Take 25 mg by mouth 2 (two) times daily with a meal. ) 180 tablet 2  . furosemide (LASIX) 40 MG tablet TAKE 1 TABLET BY MOUTH TWICE DAILY (Patient taking differently: Take 40 mg by mouth 2 (two) times daily. ) 180 tablet 1  . glipiZIDE (GLUCOTROL XL) 2.5 MG 24 hr tablet Take 1 tablet (2.5 mg total) by mouth daily with breakfast. 30 tablet 3  . glucose blood (ONETOUCH VERIO) test strip 1  each by Other route See admin instructions. Use as instructed to check blood sugar once a day 100 strip 1  . hydroxychloroquine (PLAQUENIL) 200 MG tablet Take 1 tablet by mouth 2 (two) times daily.   3  . isosorbide mononitrate (IMDUR) 30 MG 24 hr tablet TAKE 1 TABLET(30 MG) BY MOUTH DAILY (Patient taking differently: Take 30 mg by mouth daily. ) 90 tablet 2  . levothyroxine (SYNTHROID) 150 MCG tablet Take 1 tablet (150 mcg total) by mouth daily before breakfast. 30 tablet 3  . omeprazole (PRILOSEC) 40 MG capsule Take 1 capsule (40 mg total) by mouth 2 (two) times daily before a meal. 60 capsule 2  . OneTouch Delica Lancets 58I MISC 1 Device by Other route daily. 100 each 1  . sacubitril-valsartan (ENTRESTO) 49-51 MG Take 1 tablet by mouth 2 (two) times daily. 60 tablet 10  .  spironolactone (ALDACTONE) 50 MG tablet TAKE 1 TABLET(50 MG) BY MOUTH DAILY (Patient taking differently: Take 50 mg by mouth daily. ) 90 tablet 3   No current facility-administered medications for this visit.    Allergies  Allergen Reactions  . Ibuprofen Nausea Only      Review of Systems negative except from HPI and PMH  Physical Exam BP (!) 178/90 (BP Location: Left Arm, Patient Position: Sitting, Cuff Size: Normal) Comment: Ran across the parking lot  Pulse 84   Ht 5\' 10"  (1.778 m)   Wt 221 lb (100.2 kg)   SpO2 98%   BMI 31.71 kg/m  Well developed and well nourished in no acute distress HENT normal Neck supple with JVP-flat Clear Device pocket well healed; without hematoma or erythema.  There is no tethering  Regular rate and rhythm, no  gallop No  murmur Abd-soft with active BS No Clubbing cyanosis   edema Skin-warm and dry A & Oriented  Grossly normal sensory and motor function  ECG sinus at 84 Interval 23/11/38 Nonspecific ST-T changes        Assessment and  Plan   Nonischemic cardiomyopathy  Left ventricular hypertrophy-severe  CHF  Chronic systolic  ICD-Subcutaneous       Class 1 recall Generator premature depletion  Obesity  Hypertension  End of life discussion   High Risk Medication Surveillance aldactone    Euvolemic continue current meds  She comes in today to discuss device generator replacement or not in the context of her early battery depletion.  We have discussed end-of-life issues and modes of dying, sudden death versus nonsudden death, the lack of utility evident as a primary implant for nonischemic cardiomyopathy, the attendant risks of infection with device generator replacement procedures.  She has discussed this with her family.  Realizing that there was an outpatient procedure, she is elected to proceed with device generator replacement.  We have reviewed the benefits and risks of generator replacement.  These include but  are not limited to lead fracture and infection.  The patient understands, agrees and is willing to proceed.    Reviewing her echo reports there is variable left ventricular hypertrophy.  Given the potential familial implications, recommend cMRI to look for evidence of HCM

## 2019-10-24 ENCOUNTER — Telehealth: Payer: Self-pay | Admitting: Internal Medicine

## 2019-10-24 NOTE — Telephone Encounter (Signed)
Patient is unable to keep date for battery change out, please call to reschedule.

## 2019-10-27 ENCOUNTER — Ambulatory Visit (INDEPENDENT_AMBULATORY_CARE_PROVIDER_SITE_OTHER): Payer: Medicare PPO | Admitting: *Deleted

## 2019-10-27 DIAGNOSIS — I428 Other cardiomyopathies: Secondary | ICD-10-CM

## 2019-10-27 LAB — CUP PACEART REMOTE DEVICE CHECK
Date Time Interrogation Session: 20210726072700
Implantable Lead Implant Date: 20160707
Implantable Lead Location: 753862
Implantable Lead Model: 3401
Implantable Pulse Generator Implant Date: 20160707
Pulse Gen Serial Number: 115852

## 2019-10-27 NOTE — Telephone Encounter (Signed)
Patient is calling back, states she has not received a response from her  Phone call last Friday. Please call.

## 2019-10-27 NOTE — Telephone Encounter (Signed)
I have been in contact with the patient through [place]  have rescheduled the patient to 11/14/19 for her Device Generator change out. She is aware to arrive at 7:30 am on 8/13 at [facility]. I have advised her to have her pre-procedure labs/ COVID swab done on 11/12/19.

## 2019-10-27 NOTE — Telephone Encounter (Signed)
Disregard last telephone note, patient states she received a response in myChart.

## 2019-10-28 NOTE — Progress Notes (Signed)
Remote ICD transmission.   

## 2019-11-05 LAB — CUP PACEART INCLINIC DEVICE CHECK
Date Time Interrogation Session: 20210312093611
Implantable Lead Implant Date: 20160707
Implantable Lead Location: 753862
Implantable Lead Model: 3401
Implantable Pulse Generator Implant Date: 20160707
Pulse Gen Serial Number: 115852

## 2019-11-06 ENCOUNTER — Other Ambulatory Visit: Payer: Self-pay | Admitting: Family Medicine

## 2019-11-10 ENCOUNTER — Other Ambulatory Visit: Payer: Medicare PPO

## 2019-11-12 ENCOUNTER — Other Ambulatory Visit
Admission: RE | Admit: 2019-11-12 | Discharge: 2019-11-12 | Disposition: A | Discharge status: Home / Self Care | Payer: Medicare PPO | Source: Home / Self Care | Attending: Internal Medicine | Admitting: Internal Medicine

## 2019-11-12 ENCOUNTER — Other Ambulatory Visit: Payer: Self-pay

## 2019-11-12 ENCOUNTER — Other Ambulatory Visit
Admission: RE | Admit: 2019-11-12 | Discharge: 2019-11-12 | Disposition: A | Discharge status: Home / Self Care | Payer: Medicare PPO | Source: Ambulatory Visit | Attending: Internal Medicine | Admitting: Internal Medicine

## 2019-11-12 DIAGNOSIS — Z01812 Encounter for preprocedural laboratory examination: Secondary | ICD-10-CM

## 2019-11-12 DIAGNOSIS — Z20822 Contact with and (suspected) exposure to covid-19: Secondary | ICD-10-CM | POA: Insufficient documentation

## 2019-11-12 DIAGNOSIS — I5042 Chronic combined systolic (congestive) and diastolic (congestive) heart failure: Secondary | ICD-10-CM

## 2019-11-12 DIAGNOSIS — I428 Other cardiomyopathies: Secondary | ICD-10-CM

## 2019-11-12 LAB — CBC WITH DIFFERENTIAL/PLATELET
Abs Immature Granulocytes: 0.01 10*3/uL (ref 0.00–0.07)
Basophils Absolute: 0.1 10*3/uL (ref 0.0–0.1)
Basophils Relative: 1 %
Eosinophils Absolute: 0.1 10*3/uL (ref 0.0–0.5)
Eosinophils Relative: 2 %
HCT: 43.1 % (ref 36.0–46.0)
Hemoglobin: 13.2 g/dL (ref 12.0–15.0)
Immature Granulocytes: 0 %
Lymphocytes Relative: 46 %
Lymphs Abs: 2.6 10*3/uL (ref 0.7–4.0)
MCH: 28.9 pg (ref 26.0–34.0)
MCHC: 30.6 g/dL (ref 30.0–36.0)
MCV: 94.3 fL (ref 80.0–100.0)
Monocytes Absolute: 0.5 10*3/uL (ref 0.1–1.0)
Monocytes Relative: 9 %
Neutro Abs: 2.4 10*3/uL (ref 1.7–7.7)
Neutrophils Relative %: 42 %
Platelets: 252 10*3/uL (ref 150–400)
RBC: 4.57 MIL/uL (ref 3.87–5.11)
RDW: 13.9 % (ref 11.5–15.5)
WBC: 5.7 10*3/uL (ref 4.0–10.5)
nRBC: 0 % (ref 0.0–0.2)

## 2019-11-12 LAB — BASIC METABOLIC PANEL
Anion gap: 8 (ref 5–15)
BUN: 27 mg/dL — ABNORMAL HIGH (ref 8–23)
CO2: 22 mmol/L (ref 22–32)
Calcium: 9.2 mg/dL (ref 8.9–10.3)
Chloride: 110 mmol/L (ref 98–111)
Creatinine, Ser: 1.46 mg/dL — ABNORMAL HIGH (ref 0.44–1.00)
GFR calc Af Amer: 40 mL/min — ABNORMAL LOW (ref >60)
GFR calc non Af Amer: 35 mL/min — ABNORMAL LOW (ref >60)
Glucose, Bld: 133 mg/dL — ABNORMAL HIGH (ref 70–99)
Potassium: 4.2 mmol/L (ref 3.5–5.1)
Sodium: 140 mmol/L (ref 135–145)

## 2019-11-12 LAB — SARS CORONAVIRUS 2 (TAT 6-24 HRS): SARS Coronavirus 2: NEGATIVE

## 2019-11-13 ENCOUNTER — Telehealth: Payer: Self-pay

## 2019-11-13 ENCOUNTER — Telehealth: Payer: Self-pay | Admitting: Internal Medicine

## 2019-11-13 NOTE — Telephone Encounter (Signed)
Received phone call from Santiago Glad at [facility] Lab who states pt's appointment for generator change needs to be moved to procedure time of 0730am with arrival time of 530am.  Attempted phone call to advise pt and left voicemail message to contact RN at [phone].

## 2019-11-13 NOTE — Telephone Encounter (Signed)
Transferred call to [facility]

## 2019-11-13 NOTE — Telephone Encounter (Signed)
Received call back from pt and advised 11/14/2019 procedure start time now 730am with arrival time of 530am.  Pt verbalizes understanding and states she will be there. Pt thanked Therapist, sports for the call.

## 2019-11-13 NOTE — Progress Notes (Signed)
Attempted to call patient regarding instructions for procedure for tomorrow.  No answer left voicemail to have nothing to eat or drink after midnight.  Wash with special soap tonight and in the am, have a responsible person to drive them home and stay overnight with them.

## 2019-11-14 ENCOUNTER — Ambulatory Visit (HOSPITAL_COMMUNITY): Payer: Medicare PPO | Admitting: Certified Registered"

## 2019-11-14 ENCOUNTER — Ambulatory Visit (HOSPITAL_COMMUNITY)
Admission: RE | Disposition: A | Discharge status: Home / Self Care | Payer: Medicare PPO | Source: Home / Self Care | Attending: Internal Medicine

## 2019-11-14 ENCOUNTER — Ambulatory Visit (HOSPITAL_COMMUNITY)
Admission: RE | Admit: 2019-11-14 | Discharge: 2019-11-14 | Disposition: A | Discharge status: Home / Self Care | Payer: Medicare PPO | Attending: Internal Medicine | Admitting: Internal Medicine

## 2019-11-14 DIAGNOSIS — I5043 Acute on chronic combined systolic (congestive) and diastolic (congestive) heart failure: Secondary | ICD-10-CM | POA: Diagnosis not present

## 2019-11-14 DIAGNOSIS — Z4502 Encounter for adjustment and management of automatic implantable cardiac defibrillator: Secondary | ICD-10-CM | POA: Diagnosis not present

## 2019-11-14 DIAGNOSIS — Z7982 Long term (current) use of aspirin: Secondary | ICD-10-CM | POA: Diagnosis not present

## 2019-11-14 DIAGNOSIS — E0590 Thyrotoxicosis, unspecified without thyrotoxic crisis or storm: Secondary | ICD-10-CM | POA: Insufficient documentation

## 2019-11-14 DIAGNOSIS — R0602 Shortness of breath: Secondary | ICD-10-CM | POA: Insufficient documentation

## 2019-11-14 DIAGNOSIS — Z79890 Hormone replacement therapy (postmenopausal): Secondary | ICD-10-CM | POA: Insufficient documentation

## 2019-11-14 DIAGNOSIS — I110 Hypertensive heart disease with heart failure: Secondary | ICD-10-CM | POA: Insufficient documentation

## 2019-11-14 DIAGNOSIS — Z6831 Body mass index (BMI) 31.0-31.9, adult: Secondary | ICD-10-CM | POA: Diagnosis not present

## 2019-11-14 DIAGNOSIS — Z95810 Presence of automatic (implantable) cardiac defibrillator: Secondary | ICD-10-CM

## 2019-11-14 DIAGNOSIS — I5022 Chronic systolic (congestive) heart failure: Secondary | ICD-10-CM | POA: Insufficient documentation

## 2019-11-14 DIAGNOSIS — E669 Obesity, unspecified: Secondary | ICD-10-CM | POA: Diagnosis not present

## 2019-11-14 DIAGNOSIS — E1151 Type 2 diabetes mellitus with diabetic peripheral angiopathy without gangrene: Secondary | ICD-10-CM | POA: Insufficient documentation

## 2019-11-14 DIAGNOSIS — Z79899 Other long term (current) drug therapy: Secondary | ICD-10-CM | POA: Diagnosis not present

## 2019-11-14 DIAGNOSIS — I428 Other cardiomyopathies: Secondary | ICD-10-CM | POA: Insufficient documentation

## 2019-11-14 DIAGNOSIS — Z7984 Long term (current) use of oral hypoglycemic drugs: Secondary | ICD-10-CM | POA: Diagnosis not present

## 2019-11-14 DIAGNOSIS — Z45010 Encounter for checking and testing of cardiac pacemaker pulse generator [battery]: Secondary | ICD-10-CM | POA: Diagnosis not present

## 2019-11-14 DIAGNOSIS — E119 Type 2 diabetes mellitus without complications: Secondary | ICD-10-CM | POA: Diagnosis not present

## 2019-11-14 HISTORY — PX: SUBQ ICD CHANGEOUT: EP1235

## 2019-11-14 LAB — GLUCOSE, CAPILLARY
Glucose-Capillary: 129 mg/dL — ABNORMAL HIGH (ref 70–99)
Glucose-Capillary: 123 mg/dL — ABNORMAL HIGH (ref 70–99)

## 2019-11-14 SURGERY — SUBQ ICD CHANGEOUT
Anesthesia: General

## 2019-11-14 MED ORDER — PHENYLEPHRINE HCL-NACL 10-0.9 MG/250ML-% IV SOLN
INTRAVENOUS | Status: DC | PRN
  Administered 2019-11-14: 25 ug/min via INTRAVENOUS

## 2019-11-14 MED ORDER — SODIUM CHLORIDE 0.9 % IV SOLN: INTRAVENOUS | Status: DC

## 2019-11-14 MED ORDER — SODIUM CHLORIDE 0.9 % IV SOLN
80.0000 mg | INTRAVENOUS | Status: AC
  Administered 2019-11-14: 80 mg

## 2019-11-14 MED ORDER — SODIUM CHLORIDE 0.9 % IV SOLN
INTRAVENOUS | Status: AC
  Filled 2019-11-14: qty 2

## 2019-11-14 MED ORDER — ROCURONIUM BROMIDE 10 MG/ML (PF) SYRINGE
PREFILLED_SYRINGE | INTRAVENOUS | Status: DC | PRN
  Administered 2019-11-14: 40 mg via INTRAVENOUS

## 2019-11-14 MED ORDER — BUPIVACAINE HCL (PF) 0.25 % IJ SOLN
INTRAMUSCULAR | Status: DC | PRN
  Administered 2019-11-14: 60 mL

## 2019-11-14 MED ORDER — CEFAZOLIN SODIUM-DEXTROSE 2-4 GM/100ML-% IV SOLN
2.0000 g | INTRAVENOUS | Status: AC
  Administered 2019-11-14: 2 g via INTRAVENOUS

## 2019-11-14 MED ORDER — ONDANSETRON HCL 4 MG/2ML IJ SOLN: 4.0000 mg | Freq: Four times a day (QID) | INTRAMUSCULAR | Status: DC | PRN

## 2019-11-14 MED ORDER — PROPOFOL 10 MG/ML IV BOLUS
INTRAVENOUS | Status: DC | PRN
  Administered 2019-11-14: 130 mg via INTRAVENOUS

## 2019-11-14 MED ORDER — BUPIVACAINE HCL (PF) 0.25 % IJ SOLN
INTRAMUSCULAR | Status: AC
  Filled 2019-11-14: qty 60

## 2019-11-14 MED ORDER — ACETAMINOPHEN 325 MG PO TABS
325.0000 mg | ORAL_TABLET | ORAL | Status: DC | PRN
Start: 2019-11-14 — End: 2019-11-14

## 2019-11-14 MED ORDER — CEFAZOLIN SODIUM-DEXTROSE 2-4 GM/100ML-% IV SOLN
INTRAVENOUS | Status: AC
  Filled 2019-11-14: qty 100

## 2019-11-14 MED ORDER — LIDOCAINE 2% (20 MG/ML) 5 ML SYRINGE
INTRAMUSCULAR | Status: DC | PRN
  Administered 2019-11-14: 30 mg via INTRAVENOUS

## 2019-11-14 MED ORDER — FENTANYL CITRATE (PF) 250 MCG/5ML IJ SOLN
INTRAMUSCULAR | Status: DC | PRN
  Administered 2019-11-14: 50 ug via INTRAVENOUS

## 2019-11-14 MED ORDER — SUGAMMADEX SODIUM 200 MG/2ML IV SOLN
INTRAVENOUS | Status: DC | PRN
  Administered 2019-11-14: 200 mg via INTRAVENOUS

## 2019-11-14 MED ORDER — ONDANSETRON HCL 4 MG/2ML IJ SOLN
INTRAMUSCULAR | Status: DC | PRN
  Administered 2019-11-14: 4 mg via INTRAVENOUS

## 2019-11-14 MED ORDER — PHENYLEPHRINE 40 MCG/ML (10ML) SYRINGE FOR IV PUSH (FOR BLOOD PRESSURE SUPPORT)
PREFILLED_SYRINGE | INTRAVENOUS | Status: DC | PRN
  Administered 2019-11-14: 80 ug via INTRAVENOUS

## 2019-11-14 SURGICAL SUPPLY — 4 items
CABLE SURGICAL S-101-97-12 (CABLE) ×3 IMPLANT
ICD SUBCU MRI EMBLEM A219 (ICD Generator) ×2 IMPLANT
PAD PRO RADIOLUCENT 2001M-C (PAD) ×3 IMPLANT
TRAY PACEMAKER INSERTION (PACKS) ×3 IMPLANT

## 2019-11-14 NOTE — Anesthesia Postprocedure Evaluation (Signed)
Anesthesia Post Note  Patient: Paula Stein  Procedure(s) Performed: [place] (N/A )     Patient location during evaluation: PACU Anesthesia Type: General Level of consciousness: awake and alert Pain management: pain level controlled Vital Signs Assessment: post-procedure vital signs reviewed and stable Respiratory status: spontaneous breathing, nonlabored ventilation, respiratory function stable and patient connected to nasal cannula oxygen Cardiovascular status: blood pressure returned to baseline and stable Postop Assessment: no apparent nausea or vomiting Anesthetic complications: no   No complications documented.  Last Vitals:  Vitals:   11/14/19 1030 11/14/19 1035  BP: (!) 174/67   Pulse: 78 79  Resp: (!) 25 (!) 24  Temp:    SpO2: 98% 91%    Last Pain:  Vitals:   11/14/19 1018  TempSrc: Oral  PainSc: 0-No pain                 Birgit Nowling COKER

## 2019-11-14 NOTE — Transfer of Care (Signed)
Immediate Anesthesia Transfer of Care Note  Patient: Paula Stein  Procedure(s) Performed: [name] ICD CHANGEOUT (N/A )  Patient Location: Cath Lab  Anesthesia Type:General  Level of Consciousness: awake, alert , oriented, patient cooperative and responds to stimulation  Airway & Oxygen Therapy: Patient Spontanous Breathing and Patient connected to nasal cannula oxygen  Post-op Assessment: Report given to RN and Post -op Vital signs reviewed and stable  Post vital signs: Reviewed and stable  Last Vitals:  Vitals Value Taken Time  BP 148/47 11/14/19 0931  Temp 36.1 C 11/14/19 0932  Pulse 73 11/14/19 0935  Resp 17 11/14/19 0935  SpO2 100 % 11/14/19 0935  Vitals shown include unvalidated device data.  Last Pain:  Vitals:   11/14/19 0932  TempSrc: Tympanic  PainSc: 0-No pain      Patients Stated Pain Goal: 3 (56/43/32 9518)  Complications: No complications documented.

## 2019-11-14 NOTE — Anesthesia Preprocedure Evaluation (Addendum)
Anesthesia Evaluation  Patient identified by MRN, date of birth, ID band Patient awake    Reviewed: Allergy & Precautions, NPO status , Patient's Chart, lab work & pertinent test results  Airway Mallampati: II  TM Distance: >3 FB Neck ROM: Full    Dental  (+) Edentulous Upper, Dental Advisory Given   Pulmonary Current Smoker and Patient abstained from smoking.,    breath sounds clear to auscultation       Cardiovascular hypertension,  Rhythm:Regular Rate:Normal     Neuro/Psych    GI/Hepatic   Endo/Other  diabetes  Renal/GU      Musculoskeletal   Abdominal (+) + obese,   Peds  Hematology   Anesthesia Other Findings   Reproductive/Obstetrics                             Anesthesia Physical Anesthesia Plan  ASA: III  Anesthesia Plan: General   Post-op Pain Management:    Induction: Intravenous  PONV Risk Score and Plan: Ondansetron and Dexamethasone  Airway Management Planned: LMA  Additional Equipment:   Intra-op Plan:   Post-operative Plan:   Informed Consent: I have reviewed the patients History and Physical, chart, labs and discussed the procedure including the risks, benefits and alternatives for the proposed anesthesia with the patient or authorized representative who has indicated his/her understanding and acceptance.     Dental advisory given  Plan Discussed with: CRNA and Anesthesiologist  Anesthesia Plan Comments:        Anesthesia Quick Evaluation

## 2019-11-14 NOTE — Discharge Instructions (Signed)
Implantable Cardiac Device Battery Change, Care After  This sheet gives you information about how to care for yourself after your procedure. Your health care provider may also give you more specific instructions. If you have problems or questions, contact your health care provider. What can I expect after the procedure? After your procedure, it is common to have:  Pain or soreness at the site where the cardiac device was inserted.  Swelling at the site where the cardiac device was inserted.  You should received an information card for your new device in 4-8 weeks. Follow these instructions at home: Incision care   Keep the incision clean and dry. ? Do not take baths, swim, or use a hot tub until after your wound check.  ? Do not shower for at least 7 days, or as directed by your health care provider. ? Pat the area dry with a clean towel. Do not rub the area. This may cause bleeding.  Follow instructions from your health care provider about how to take care of your incision. Make sure you: ? Leave stitches (sutures), skin glue, or adhesive strips in place. These skin closures may need to stay in place for 2 weeks or longer. If adhesive strip edges start to loosen and curl up, you may trim the loose edges. Do not remove adhesive strips completely unless your health care provider tells you to do that.  Check your incision area every day for signs of infection. Check for: ? More redness, swelling, or pain. ? More fluid or blood. ? Warmth. ? Pus or a bad smell. Activity  Do not lift anything that is heavier than 10 lb (4.5 kg) until your health care provider says it is okay to do so.  For the first week, or as long as told by your health care provider: ? Avoid lifting your affected arm higher than your shoulder. ? After 1 week, Be gentle when you move your arms over your head. It is okay to raise your arm to comb your hair. ? Avoid strenuous exercise.  Ask your health care provider  when it is okay to: ? Resume your normal activities. ? Return to work or school. ? Resume sexual activity. Eating and drinking  Eat a heart-healthy diet. This should include plenty of fresh fruits and vegetables, whole grains, low-fat dairy products, and lean protein like chicken and fish.  Limit alcohol intake to no more than 1 drink a day for non-pregnant women and 2 drinks a day for men. One drink equals 12 oz of beer, 5 oz of wine, or 1 oz of hard liquor.  Check ingredients and nutrition facts on packaged foods and beverages. Avoid the following types of food: ? Food that is high in salt (sodium). ? Food that is high in saturated fat, like full-fat dairy or red meat. ? Food that is high in trans fat, like fried food. ? Food and drinks that are high in sugar. Lifestyle  Do not use any products that contain nicotine or tobacco, such as cigarettes and e-cigarettes. If you need help quitting, ask your health care provider.  Take steps to manage and control your weight.  Once cleared, get regular exercise. Aim for 150 minutes of moderate-intensity exercise (such as walking or yoga) or 75 minutes of vigorous exercise (such as running or swimming) each week.  Manage other health problems, such as diabetes or high blood pressure. Ask your health care provider how you can manage these conditions. General instructions  Do   not drive for 24 hours after your procedure if you were given a medicine to help you relax (sedative).  Take over-the-counter and prescription medicines only as told by your health care provider.  Avoid putting pressure on the area where the cardiac device was placed.  If you need an MRI after your cardiac device has been placed, be sure to tell the health care provider who orders the MRI that you have a cardiac device.  Avoid close and prolonged exposure to electrical devices that have strong magnetic fields. These include: ? Cell phones. Avoid keeping them in a  pocket near the cardiac device, and try using the ear opposite the cardiac device. ? MP3 players. ? Household appliances, like microwaves. ? Metal detectors. ? Electric generators. ? High-tension wires.  Keep all follow-up visits as directed by your health care provider. This is important. Contact a health care provider if:  You have pain at the incision site that is not relieved by over-the-counter or prescription medicines.  You have any of these around your incision site or coming from it: ? More redness, swelling, or pain. ? Fluid or blood. ? Warmth to the touch. ? Pus or a bad smell.  You have a fever.  You feel brief, occasional palpitations, light-headedness, or any symptoms that you think might be related to your heart. Get help right away if:  You experience chest pain that is different from the pain at the cardiac device site.  You develop a red streak that extends above or below the incision site.  You experience shortness of breath.  You have palpitations or an irregular heartbeat.  You have light-headedness that does not go away quickly.  You faint or have dizzy spells.  Your pulse suddenly drops or increases rapidly and does not return to normal.  You begin to gain weight and your legs and ankles swell. Summary  After your procedure, it is common to have pain, soreness, and some swelling where the cardiac device was inserted.  Make sure to keep your incision clean and dry. Follow instructions from your health care provider about how to take care of your incision.  Check your incision every day for signs of infection, such as more pain or swelling, pus or a bad smell, warmth, or leaking fluid and blood.  Avoid strenuous exercise and lifting your left arm higher than your shoulder for 2 weeks, or as long as told by your health care provider. This information is not intended to replace advice given to you by your health care provider. Make sure you discuss  any questions you have with your health care provider. Keep Wound dry until tomorrow. Dermabond will come off in approximately 14 days.  Wound check scheduled.

## 2019-11-14 NOTE — Interval H&P Note (Signed)
History and Physical Interval Note:  11/14/2019 7:40 AMICD Criteria  Current LVEF:33%. Within 12 months prior to implant: Yes   Heart failure history: Yes, Class II  Cardiomyopathy history: Yes, Non-Ischemic Cardiomyopathy.  Atrial Fibrillation/Atrial Flutter: No.  Ventricular tachycardia history: No.  Cardiac arrest history: No.  History of syndromes with risk of sudden death: No.  Previous ICD: Yes, Reason for ICD:  Primary prevention.  Current ICD indication: Primary  PPM indication: No.  Class I or II Bradycardia indication present: No  Beta Blocker therapy for 3 or more months: Yes, prescribed.   Ace Inhibitor/ARB therapy for 3 or more months: Yes, prescribed.    I have seen Paula Stein is a 74 y.o. femalepre-procedural and has seen for ICD implant for primary prevention of sudden death.  The patient's chart has been reviewed and they meet criteria for ICD implant.  I have had a thorough discussion with the patient reviewing options.  The patient and their family (if available) have had opportunities to ask questions and have them answered. The patient and I have decided together through the [place] [employer] to reimplant ICD at this time.  Risks, benefits, alternatives to ICD implantation were discussed in detail with the patient today. The patient  understands that the risks include but are not limited to bleeding, infection, pneumothorax, perforation, tamponade, vascular damage, renal failure, MI, stroke, death, inappropriate shocks, and lead dislodgement and  wishes to proceed.    Paula Stein  has presented today for surgery, with the diagnosis of ERI, recall on battery.  The various methods of treatment have been discussed with the patient and family. After consideration of risks, benefits and other options for treatment, the patient has consented to  Procedure(s): SUBQ ICD CHANGEOUT (N/A) as a surgical intervention.  The patient's history has  been reviewed, patient examined, no change in status, stable for surgery.  I have reviewed the patient's chart and labs.  Questions were answered to the patient's satisfaction.     Virl Axe

## 2019-11-14 NOTE — Anesthesia Procedure Notes (Signed)
Procedure Name: Intubation Date/Time: 11/14/2019 8:03 AM Performed by: Glynda Jaeger, CRNA Pre-anesthesia Checklist: Patient identified, Patient being monitored, Timeout performed, Emergency Drugs available and Suction available Patient Re-evaluated:Patient Re-evaluated prior to induction Oxygen Delivery Method: Circle System Utilized Preoxygenation: Pre-oxygenation with 100% oxygen Induction Type: IV induction Ventilation: Mask ventilation without difficulty Laryngoscope Size: Mac and 4 Grade View: Grade II Tube type: Oral Tube size: 7.5 mm Number of attempts: 1 Airway Equipment and Method: Stylet Placement Confirmation: ETT inserted through vocal cords under direct vision,  positive ETCO2 and breath sounds checked- equal and bilateral Secured at: 22 cm Tube secured with: Tape Dental Injury: Teeth and Oropharynx as per pre-operative assessment

## 2019-11-15 ENCOUNTER — Telehealth: Payer: Self-pay | Admitting: Cardiology

## 2019-11-15 NOTE — Telephone Encounter (Signed)
Patient is s/p ICD battery change out on Friday 8/13 for EOL battery.  Now calling due to complaints of incisional pain that is not improved with Extra strength Tylenol.  Instructed her to try to rest and keep arm still and avoid any reaching.  Continue the Tylenol.  Explained that I cannot call in narcotics over the phone.  She will continue to rest and if pain gets worse she will go to the ER.  She says that it is mildly swollen but not much.  She will call back if symptoms worsen.

## 2019-11-17 ENCOUNTER — Telehealth: Payer: Self-pay

## 2019-11-17 ENCOUNTER — Encounter (HOSPITAL_COMMUNITY): Payer: Self-pay | Admitting: Internal Medicine

## 2019-11-17 MED FILL — Gentamicin Sulfate Inj 40 MG/ML: INTRAMUSCULAR | Qty: 80 | Status: AC

## 2019-11-17 MED FILL — Cefazolin Sodium-Dextrose IV Solution 2 GM/100ML-4%: INTRAVENOUS | Qty: 100 | Status: AC

## 2019-11-17 NOTE — Telephone Encounter (Signed)
Called patient to discuss MyChart message. She stated that the Extra Strength Tylenol wasn't helping her pain, however she was feeling much better today than she was yesterday. She denied any fever, redness, swelling, or oozing at the surgical incision for her battery change.  She asked if it would be okay to take Aleve as that what she normally uses for pain and usually gets good results with it. Dr. Rockey Situ (DOD) stated that would be fine to try.  Advised patient to call if her pain persists or she develops any signs or symptoms of infection as discussed above.  Patient was very grateful for the call and verbalized understanding and agreed with plan.

## 2019-11-24 ENCOUNTER — Other Ambulatory Visit: Payer: Self-pay

## 2019-11-24 ENCOUNTER — Other Ambulatory Visit (INDEPENDENT_AMBULATORY_CARE_PROVIDER_SITE_OTHER): Payer: Medicare PPO

## 2019-11-24 DIAGNOSIS — E039 Hypothyroidism, unspecified: Secondary | ICD-10-CM

## 2019-11-24 LAB — TSH: TSH: 0.01 mIU/L — ABNORMAL LOW (ref 0.40–4.50)

## 2019-11-25 ENCOUNTER — Other Ambulatory Visit: Payer: Self-pay

## 2019-11-25 ENCOUNTER — Ambulatory Visit (INDEPENDENT_AMBULATORY_CARE_PROVIDER_SITE_OTHER): Payer: Medicare PPO | Admitting: Emergency Medicine

## 2019-11-25 ENCOUNTER — Encounter: Payer: Self-pay | Admitting: Family Medicine

## 2019-11-25 ENCOUNTER — Telehealth: Payer: Self-pay | Admitting: Family Medicine

## 2019-11-25 DIAGNOSIS — Z95810 Presence of automatic (implantable) cardiac defibrillator: Secondary | ICD-10-CM | POA: Diagnosis not present

## 2019-11-25 DIAGNOSIS — I428 Other cardiomyopathies: Secondary | ICD-10-CM

## 2019-11-25 DIAGNOSIS — E039 Hypothyroidism, unspecified: Secondary | ICD-10-CM

## 2019-11-25 LAB — CUP PACEART INCLINIC DEVICE CHECK
Date Time Interrogation Session: 20210824170700
Implantable Lead Implant Date: 20160707
Implantable Lead Location: 753862
Implantable Lead Model: 3401
Implantable Pulse Generator Implant Date: 20210813
Pulse Gen Serial Number: 143555

## 2019-11-25 MED ORDER — LEVOTHYROXINE SODIUM 125 MCG PO TABS: ORAL_TABLET | ORAL | 2 refills | Status: DC

## 2019-11-25 NOTE — Telephone Encounter (Signed)
Pt returned call to [place]. Please call back when you have a chance.

## 2019-11-25 NOTE — Progress Notes (Signed)
Subcutaneous ICD wound check in clinic after gen. change. Steri-strips removed incision edges well approximated, no drainage , redness, or signs of infection. 0 untreated episodes; 0 treated episodes; 0 shocks delivered. Electrode impedance status okay. No programming changes. Remaining longevity to ERI 99%. Follow up with Dr [name] 02/24/20. Patient enrolled in remote monitoring and next remote scheduled for 02/18/20.

## 2019-11-25 NOTE — Telephone Encounter (Signed)
Spoke with pt reviewed lab results and recommendations. New Rx for Synthroid 125 mcg sent to pt pharmacy per Dr Volanda Napoleon. Pt aware

## 2019-11-26 ENCOUNTER — Other Ambulatory Visit: Payer: Self-pay

## 2019-11-26 MED ORDER — ACCU-CHEK GUIDE W/DEVICE KIT: 1.0000 | PACK | Freq: Every day | 0 refills | Status: DC

## 2019-11-26 MED ORDER — ACCU-CHEK GUIDE VI STRP: ORAL_STRIP | 1 refills | Status: DC

## 2019-11-26 MED ORDER — ACCU-CHEK SOFTCLIX LANCETS MISC
1 refills | Status: DC
Start: 2019-11-26 — End: 2020-02-06

## 2019-12-15 ENCOUNTER — Other Ambulatory Visit: Payer: Self-pay

## 2019-12-15 ENCOUNTER — Ambulatory Visit (INDEPENDENT_AMBULATORY_CARE_PROVIDER_SITE_OTHER): Payer: Medicare PPO | Admitting: Family Medicine

## 2019-12-15 ENCOUNTER — Encounter: Payer: Self-pay | Admitting: Family Medicine

## 2019-12-15 VITALS — BP 160/88 | HR 82 | Temp 98.6°F | Wt 213.0 lb

## 2019-12-15 DIAGNOSIS — I10 Essential (primary) hypertension: Secondary | ICD-10-CM

## 2019-12-15 DIAGNOSIS — E039 Hypothyroidism, unspecified: Secondary | ICD-10-CM | POA: Diagnosis not present

## 2019-12-15 DIAGNOSIS — R413 Other amnesia: Secondary | ICD-10-CM

## 2019-12-15 DIAGNOSIS — Z23 Encounter for immunization: Secondary | ICD-10-CM

## 2019-12-15 DIAGNOSIS — E1142 Type 2 diabetes mellitus with diabetic polyneuropathy: Secondary | ICD-10-CM | POA: Diagnosis not present

## 2019-12-15 NOTE — Progress Notes (Signed)
Subjective:    Patient ID: Paula Stein, female    DOB: 02/10/1946, 74 y.o.   MRN: [id number]  No chief complaint on file.   HPI Patient was seen today for follow-up.  Patient states she has been doing well overall.  Had the Moderna COVID-19 vaccines on 04/18/2019 and 05/16/2019 per card.  Inquires about booster vaccine.  Pt mentions occasional memory issue over several months.  May walk into a room and forget what she went in 1 to do.  Pt denies any issues driving or becoming lost while driving or in stores.  Spending time with one of her family members who was recently diagnosed with lupus.  TSH low at 0.1 on 11/25/2019.  Synthroid dose decreased to 125 mcg from 150 mcg.  Pt states she is taking Synthroid in the morning 30 minutes prior to taking other meds or eating.  Pt states she often forgets to take her other meds.  Did not take BP meds this morning.  Ordered a pill dispenser to help her keep track of her medications.  FSBS at home between 90-125 per log pt brought with her to clinic.  Taking glipizide XL 2.5 mg daily.  Pt taking spironolactone 50 mg, Entresto 49-51 mg, Imdur 30 mg, Lasix 40 mg, Coreg 25 mg and Lipitor 60 mg.  Followed by cardiology.  Pt forgot to take BP medications this morning.  Pt endorses receiving shingles vaccine at [facility].  States second dose is due September 19?   Past Medical History:  Diagnosis Date  . AICD (automatic cardioverter/defibrillator) present 10/08/2014   SUBQ    /    DR [name]  . CHF (congestive heart failure) ([place])   . Diabetes mellitus without complication ([place])   . History of cardiac cath 05/2007   normal-with patent coronaries  . History of colonoscopy   . History of hiatal hernia   . Hypertension   . Iatrogenic thyroiditis   . Non-ischemic cardiomyopathy ([place])    EF 28%- reassessment of LV function 2011 with LVEf 45-50%  . PAD (peripheral artery disease) ([facility])    lower extremities with ABIs of 0.5 bilaterally  . Personal history of  goiter   . S/P radioactive iodine thyroid ablation   . S/P thyroidectomy   . Shortness of breath dyspnea     Allergies  Allergen Reactions  . Ibuprofen Nausea Only    ROS General: Denies fever, chills, night sweats, changes in weight, changes in appetite  + memory concern HEENT: Denies headaches, ear pain, changes in vision, rhinorrhea, sore throat CV: Denies CP, palpitations, SOB, orthopnea Pulm: Denies SOB, cough, wheezing GI: Denies abdominal pain, nausea, vomiting, diarrhea, constipation GU: Denies dysuria, hematuria, frequency, vaginal discharge Msk: Denies muscle cramps, joint pains Neuro: Denies weakness, numbness, tingling Skin: Denies rashes, bruising Psych: Denies depression, anxiety, hallucinations    Objective:    Blood pressure (!) 160/88, pulse 82, temperature 98.6 F (37 C), temperature source Oral, weight 213 lb (96.6 kg), SpO2 97 %.  Gen. Pleasant, well-nourished, in no distress, normal affect   HEENT: [place]/AT, face symmetric, conjunctiva clear, no scleral icterus, PERRLA, EOMI, nares patent without drainage Lungs: no accessory muscle use, CTAB, no wheezes or rales Cardiovascular: RRR, no m/r/g, no peripheral edema Abdomen: BS present, soft, NT/ND, no hepatosplenomegaly. Musculoskeletal: No deformities, no cyanosis or clubbing, normal tone Neuro:  A&Ox3, CN II-XII intact, normal gait Skin:  Warm, no lesions/ rash Psych: A&Ox 3.  MOCA given.  Pt able to name 7 words that  start with the letter "F" before repeating words.    Wt Readings from Last 3 Encounters:  12/15/19 213 lb (96.6 kg)  11/14/19 220 lb (99.8 kg)  10/23/19 221 lb (100.2 kg)    Lab Results  Component Value Date   WBC 5.7 11/12/2019   HGB 13.2 11/12/2019   HCT 43.1 11/12/2019   PLT 252 11/12/2019   GLUCOSE 133 (H) 11/12/2019   CHOL 228 (H) 02/06/2019   TRIG 165 (H) 02/06/2019   HDL 91 02/06/2019   LDLDIRECT 195.2 02/15/2011   LDLCALC 109 (H) 02/06/2019   ALT 12 08/13/2019   AST 13  08/13/2019   NA 140 11/12/2019   K 4.2 11/12/2019   CL 110 11/12/2019   CREATININE 1.46 (H) 11/12/2019   BUN 27 (H) 11/12/2019   CO2 22 11/12/2019   TSH 0.01 (L) 11/24/2019   INR 0.9 10/01/2014   HGBA1C 7.2 (A) 07/14/2019   MICROALBUR 33.5 (H) 06/02/2015    Assessment/Plan:  Type 2 diabetes mellitus with diabetic polyneuropathy, without long-term current use of insulin ([place]) -last hgb A1C 7.2%on 07/14/19 -discussed lifestyle modifications -continue glipizide xl 2.5 mg  Memory deficit -TSH 0.01 on 11/24/19 -discussed having pt take meds consistently for the next 6 wks.  Will then recheck TSH. -MOCA score 21/30.  Copy to be scanned into chart. -also discussed Neuropsych testing.  Pt wishes to wait.  Will re-evaluate after repeat labs.  Essential hypertension -elevated.  -pt advised to take meds this am -lifestyle modifications encouraged -creatinine 1.46 on 11/12/19 -continue current meds:  Coreg 25 mg BID, lasix 40 mg, imdur 30 mg daily, entresto 49-51 mg BID, spironolactone 50 mg daily -continue f/u with Cardiology  Acquired hypothyroidism -pt encouraged to take synthroid 125 mcg q am prior to taking other meds or eating. -discussed rechecking TSH in 6 wks.  Need for influenza vaccination  - Plan: Flu Vaccine QUAD High Dose(Fluad)  F/u in the next month  Grier Mitts, MD

## 2019-12-15 NOTE — Patient Instructions (Signed)
   Managing Your Hypertension Hypertension is commonly called high blood pressure. This is when the force of your blood pressing against the walls of your arteries is too strong. Arteries are blood vessels that carry blood from your heart throughout your body. Hypertension forces the heart to work harder to pump blood, and may cause the arteries to become narrow or stiff. Having untreated or uncontrolled hypertension can cause heart attack, stroke, kidney disease, and other problems. What are blood pressure readings? A blood pressure reading consists of a higher number over a lower number. Ideally, your blood pressure should be below 120/80. The first ("top") number is called the systolic pressure. It is a measure of the pressure in your arteries as your heart beats. The second ("bottom") number is called the diastolic pressure. It is a measure of the pressure in your arteries as the heart relaxes. What does my blood pressure reading mean? Blood pressure is classified into four stages. Based on your blood pressure reading, your health care provider may use the following stages to determine what type of treatment you need, if any. Systolic pressure and diastolic pressure are measured in a unit called mm Hg. Normal  Systolic pressure: below 120.  Diastolic pressure: below 80. Elevated  Systolic pressure: 120-129.  Diastolic pressure: below 80. Hypertension stage 1  Systolic pressure: 130-139.  Diastolic pressure: 80-89. Hypertension stage 2  Systolic pressure: 140 or above.  Diastolic pressure: 90 or above. What health risks are associated with hypertension? Managing your hypertension is an important responsibility. Uncontrolled hypertension can lead to:  A heart attack.  A stroke.  A weakened blood vessel (aneurysm).  Heart failure.  Kidney damage.  Eye damage.  Metabolic syndrome.  Memory and concentration problems. What changes can I make to manage my  hypertension? Hypertension can be managed by making lifestyle changes and possibly by taking medicines. Your health care provider will help you make a plan to bring your blood pressure within a normal range. Eating and drinking   Eat a diet that is high in fiber and potassium, and low in salt (sodium), added sugar, and fat. An example eating plan is called the DASH (Dietary Approaches to Stop Hypertension) diet. To eat this way: ? Eat plenty of fresh fruits and vegetables. Try to fill half of your plate at each meal with fruits and vegetables. ? Eat whole grains, such as whole wheat pasta, brown rice, or whole grain bread. Fill about one quarter of your plate with whole grains. ? Eat low-fat diary products. ? Avoid fatty cuts of meat, processed or cured meats, and poultry with skin. Fill about one quarter of your plate with lean proteins such as fish, chicken without skin, beans, eggs, and tofu. ? Avoid premade and processed foods. These tend to be higher in sodium, added sugar, and fat.  Reduce your daily sodium intake. Most people with hypertension should eat less than 1,500 mg of sodium a day.  Limit alcohol intake to no more than 1 drink a day for nonpregnant women and 2 drinks a day for men. One drink equals 12 oz of beer, 5 oz of wine, or 1 oz of hard liquor. Lifestyle  Work with your health care provider to maintain a healthy body weight, or to lose weight. Ask what an ideal weight is for you.  Get at least 30 minutes of exercise that causes your heart to beat faster (aerobic exercise) most days of the week. Activities may include walking, swimming, or biking.    Include exercise to strengthen your muscles (resistance exercise), such as weight lifting, as part of your weekly exercise routine. Try to do these types of exercises for 30 minutes at least 3 days a week.  Do not use any products that contain nicotine or tobacco, such as cigarettes and e-cigarettes. If you need help quitting,  ask your health care provider.  Control any long-term (chronic) conditions you have, such as high cholesterol or diabetes. Monitoring  Monitor your blood pressure at home as told by your health care provider. Your personal target blood pressure may vary depending on your medical conditions, your age, and other factors.  Have your blood pressure checked regularly, as often as told by your health care provider. Working with your health care provider  Review all the medicines you take with your health care provider because there may be side effects or interactions.  Talk with your health care provider about your diet, exercise habits, and other lifestyle factors that may be contributing to hypertension.  Visit your health care provider regularly. Your health care provider can help you create and adjust your plan for managing hypertension. Will I need medicine to control my blood pressure? Your health care provider may prescribe medicine if lifestyle changes are not enough to get your blood pressure under control, and if:  Your systolic blood pressure is 130 or higher.  Your diastolic blood pressure is 80 or higher. Take medicines only as told by your health care provider. Follow the directions carefully. Blood pressure medicines must be taken as prescribed. The medicine does not work as well when you skip doses. Skipping doses also puts you at risk for problems. Contact a health care provider if:  You think you are having a reaction to medicines you have taken.  You have repeated (recurrent) headaches.  You feel dizzy.  You have swelling in your ankles.  You have trouble with your vision. Get help right away if:  You develop a severe headache or confusion.  You have unusual weakness or numbness, or you feel faint.  You have severe pain in your chest or abdomen.  You vomit repeatedly.  You have trouble breathing. Summary  Hypertension is when the force of blood pumping  through your arteries is too strong. If this condition is not controlled, it may put you at risk for serious complications.  Your personal target blood pressure may vary depending on your medical conditions, your age, and other factors. For most people, a normal blood pressure is less than 120/80.  Hypertension is managed by lifestyle changes, medicines, or both. Lifestyle changes include weight loss, eating a healthy, low-sodium diet, exercising more, and limiting alcohol. This information is not intended to replace advice given to you by your health care provider. Make sure you discuss any questions you have with your health care provider. Document Revised: 07/12/2018 Document Reviewed: 02/16/2016 Elsevier Patient Education  2020 [employer]  

## 2020-01-01 ENCOUNTER — Other Ambulatory Visit: Payer: Self-pay | Admitting: Family Medicine

## 2020-01-01 ENCOUNTER — Other Ambulatory Visit: Payer: Self-pay

## 2020-01-01 ENCOUNTER — Ambulatory Visit
Admission: RE | Admit: 2020-01-01 | Discharge: 2020-01-01 | Disposition: A | Discharge status: Home / Self Care | Payer: Medicare PPO | Source: Ambulatory Visit | Attending: Family Medicine | Admitting: Family Medicine

## 2020-01-01 DIAGNOSIS — E2839 Other primary ovarian failure: Secondary | ICD-10-CM

## 2020-01-01 DIAGNOSIS — Z780 Asymptomatic menopausal state: Secondary | ICD-10-CM | POA: Diagnosis not present

## 2020-01-01 DIAGNOSIS — Z1231 Encounter for screening mammogram for malignant neoplasm of breast: Secondary | ICD-10-CM

## 2020-01-16 ENCOUNTER — Other Ambulatory Visit: Payer: Self-pay | Admitting: Family Medicine

## 2020-01-16 ENCOUNTER — Other Ambulatory Visit: Payer: Self-pay

## 2020-01-16 DIAGNOSIS — E7849 Other hyperlipidemia: Secondary | ICD-10-CM

## 2020-01-16 MED ORDER — ATORVASTATIN CALCIUM 40 MG PO TABS: 40.0000 mg | ORAL_TABLET | Freq: Every day | ORAL | 0 refills | Status: DC

## 2020-01-22 ENCOUNTER — Encounter: Payer: Self-pay | Admitting: Family Medicine

## 2020-01-22 ENCOUNTER — Other Ambulatory Visit: Payer: Self-pay

## 2020-01-22 DIAGNOSIS — I739 Peripheral vascular disease, unspecified: Secondary | ICD-10-CM

## 2020-01-22 DIAGNOSIS — E782 Mixed hyperlipidemia: Secondary | ICD-10-CM

## 2020-01-22 DIAGNOSIS — I5022 Chronic systolic (congestive) heart failure: Secondary | ICD-10-CM

## 2020-01-22 DIAGNOSIS — I10 Essential (primary) hypertension: Secondary | ICD-10-CM

## 2020-01-22 MED ORDER — SPIRONOLACTONE 50 MG PO TABS: ORAL_TABLET | ORAL | 0 refills | Status: DC

## 2020-01-23 ENCOUNTER — Other Ambulatory Visit: Payer: Self-pay

## 2020-01-23 DIAGNOSIS — I10 Essential (primary) hypertension: Secondary | ICD-10-CM

## 2020-01-23 MED ORDER — CARVEDILOL 25 MG PO TABS: 25.0000 mg | ORAL_TABLET | Freq: Two times a day (BID) | ORAL | 0 refills | Status: DC

## 2020-01-23 MED ORDER — ENTRESTO 49-51 MG PO TABS: 1.0000 | ORAL_TABLET | Freq: Two times a day (BID) | ORAL | 3 refills | Status: DC

## 2020-01-23 MED ORDER — FUROSEMIDE 40 MG PO TABS: 40.0000 mg | ORAL_TABLET | Freq: Two times a day (BID) | ORAL | 0 refills | Status: DC

## 2020-01-23 NOTE — Telephone Encounter (Signed)
This is a [place] pt. Please address 

## 2020-01-27 ENCOUNTER — Other Ambulatory Visit: Payer: Self-pay

## 2020-01-27 ENCOUNTER — Ambulatory Visit
Admission: RE | Admit: 2020-01-27 | Discharge: 2020-01-27 | Disposition: A | Discharge status: Home / Self Care | Payer: Medicare PPO | Source: Ambulatory Visit | Attending: Family Medicine | Admitting: Family Medicine

## 2020-01-27 DIAGNOSIS — Z1231 Encounter for screening mammogram for malignant neoplasm of breast: Secondary | ICD-10-CM | POA: Diagnosis not present

## 2020-01-27 MED ORDER — ACCU-CHEK GUIDE VI STRP: ORAL_STRIP | 1 refills | Status: DC

## 2020-02-06 ENCOUNTER — Other Ambulatory Visit: Payer: Self-pay

## 2020-02-06 DIAGNOSIS — E039 Hypothyroidism, unspecified: Secondary | ICD-10-CM

## 2020-02-06 MED ORDER — LEVOTHYROXINE SODIUM 125 MCG PO TABS: ORAL_TABLET | ORAL | 2 refills | Status: DC

## 2020-02-06 MED ORDER — GLIPIZIDE ER 2.5 MG PO TB24: 2.5000 mg | ORAL_TABLET | Freq: Every day | ORAL | 3 refills | Status: DC

## 2020-02-06 MED ORDER — ACCU-CHEK SOFTCLIX LANCETS MISC: 1 refills | Status: DC

## 2020-02-08 ENCOUNTER — Other Ambulatory Visit: Payer: Self-pay | Admitting: Family Medicine

## 2020-02-09 NOTE — Telephone Encounter (Signed)
Rx already sent to pt pharmacy 

## 2020-02-18 ENCOUNTER — Ambulatory Visit: Payer: Medicare PPO

## 2020-02-24 ENCOUNTER — Encounter: Payer: Medicare PPO | Admitting: Internal Medicine

## 2020-03-13 ENCOUNTER — Other Ambulatory Visit: Payer: Self-pay

## 2020-03-13 ENCOUNTER — Encounter (HOSPITAL_COMMUNITY): Payer: Self-pay | Admitting: Emergency Medicine

## 2020-03-13 ENCOUNTER — Emergency Department (HOSPITAL_COMMUNITY)
Admission: EM | Admit: 2020-03-13 | Discharge: 2020-03-14 | Disposition: A | Discharge status: Home / Self Care | Payer: Medicare PPO | Source: Home / Self Care

## 2020-03-13 DIAGNOSIS — E1122 Type 2 diabetes mellitus with diabetic chronic kidney disease: Secondary | ICD-10-CM | POA: Diagnosis present

## 2020-03-13 DIAGNOSIS — Z20822 Contact with and (suspected) exposure to covid-19: Secondary | ICD-10-CM | POA: Diagnosis present

## 2020-03-13 DIAGNOSIS — R112 Nausea with vomiting, unspecified: Secondary | ICD-10-CM | POA: Insufficient documentation

## 2020-03-13 DIAGNOSIS — E039 Hypothyroidism, unspecified: Secondary | ICD-10-CM | POA: Diagnosis not present

## 2020-03-13 DIAGNOSIS — I81 Portal vein thrombosis: Secondary | ICD-10-CM | POA: Diagnosis present

## 2020-03-13 DIAGNOSIS — R350 Frequency of micturition: Secondary | ICD-10-CM | POA: Insufficient documentation

## 2020-03-13 DIAGNOSIS — Z791 Long term (current) use of non-steroidal anti-inflammatories (NSAID): Secondary | ICD-10-CM | POA: Diagnosis not present

## 2020-03-13 DIAGNOSIS — E890 Postprocedural hypothyroidism: Secondary | ICD-10-CM | POA: Diagnosis present

## 2020-03-13 DIAGNOSIS — Z7982 Long term (current) use of aspirin: Secondary | ICD-10-CM | POA: Diagnosis not present

## 2020-03-13 DIAGNOSIS — R1030 Lower abdominal pain, unspecified: Secondary | ICD-10-CM | POA: Insufficient documentation

## 2020-03-13 DIAGNOSIS — E785 Hyperlipidemia, unspecified: Secondary | ICD-10-CM | POA: Diagnosis present

## 2020-03-13 DIAGNOSIS — Z8249 Family history of ischemic heart disease and other diseases of the circulatory system: Secondary | ICD-10-CM | POA: Diagnosis not present

## 2020-03-13 DIAGNOSIS — Z833 Family history of diabetes mellitus: Secondary | ICD-10-CM | POA: Diagnosis not present

## 2020-03-13 DIAGNOSIS — E1151 Type 2 diabetes mellitus with diabetic peripheral angiopathy without gangrene: Secondary | ICD-10-CM | POA: Diagnosis present

## 2020-03-13 DIAGNOSIS — K5732 Diverticulitis of large intestine without perforation or abscess without bleeding: Secondary | ICD-10-CM | POA: Diagnosis present

## 2020-03-13 DIAGNOSIS — R109 Unspecified abdominal pain: Secondary | ICD-10-CM | POA: Diagnosis not present

## 2020-03-13 DIAGNOSIS — E1169 Type 2 diabetes mellitus with other specified complication: Secondary | ICD-10-CM | POA: Diagnosis present

## 2020-03-13 DIAGNOSIS — N1832 Chronic kidney disease, stage 3b: Secondary | ICD-10-CM | POA: Diagnosis present

## 2020-03-13 DIAGNOSIS — I428 Other cardiomyopathies: Secondary | ICD-10-CM | POA: Diagnosis present

## 2020-03-13 DIAGNOSIS — E119 Type 2 diabetes mellitus without complications: Secondary | ICD-10-CM | POA: Diagnosis not present

## 2020-03-13 DIAGNOSIS — F17210 Nicotine dependence, cigarettes, uncomplicated: Secondary | ICD-10-CM | POA: Diagnosis present

## 2020-03-13 DIAGNOSIS — Z83438 Family history of other disorder of lipoprotein metabolism and other lipidemia: Secondary | ICD-10-CM | POA: Diagnosis not present

## 2020-03-13 DIAGNOSIS — I5042 Chronic combined systolic (congestive) and diastolic (congestive) heart failure: Secondary | ICD-10-CM | POA: Diagnosis present

## 2020-03-13 DIAGNOSIS — I10 Essential (primary) hypertension: Secondary | ICD-10-CM | POA: Diagnosis not present

## 2020-03-13 DIAGNOSIS — N390 Urinary tract infection, site not specified: Secondary | ICD-10-CM | POA: Diagnosis present

## 2020-03-13 DIAGNOSIS — I130 Hypertensive heart and chronic kidney disease with heart failure and stage 1 through stage 4 chronic kidney disease, or unspecified chronic kidney disease: Secondary | ICD-10-CM | POA: Diagnosis present

## 2020-03-13 DIAGNOSIS — K5792 Diverticulitis of intestine, part unspecified, without perforation or abscess without bleeding: Secondary | ICD-10-CM | POA: Diagnosis not present

## 2020-03-13 DIAGNOSIS — Z95810 Presence of automatic (implantable) cardiac defibrillator: Secondary | ICD-10-CM | POA: Diagnosis not present

## 2020-03-13 DIAGNOSIS — I2510 Atherosclerotic heart disease of native coronary artery without angina pectoris: Secondary | ICD-10-CM | POA: Diagnosis present

## 2020-03-13 DIAGNOSIS — Z5321 Procedure and treatment not carried out due to patient leaving prior to being seen by health care provider: Secondary | ICD-10-CM | POA: Insufficient documentation

## 2020-03-13 DIAGNOSIS — Z79899 Other long term (current) drug therapy: Secondary | ICD-10-CM | POA: Diagnosis not present

## 2020-03-13 DIAGNOSIS — Z7984 Long term (current) use of oral hypoglycemic drugs: Secondary | ICD-10-CM | POA: Diagnosis not present

## 2020-03-13 DIAGNOSIS — Z79890 Hormone replacement therapy (postmenopausal): Secondary | ICD-10-CM | POA: Diagnosis not present

## 2020-03-13 DIAGNOSIS — J069 Acute upper respiratory infection, unspecified: Secondary | ICD-10-CM | POA: Diagnosis not present

## 2020-03-13 LAB — COMPREHENSIVE METABOLIC PANEL
ALT: 13 U/L (ref 0–44)
AST: 15 U/L (ref 15–41)
Albumin: 3.1 g/dL — ABNORMAL LOW (ref 3.5–5.0)
Alkaline Phosphatase: 103 U/L (ref 38–126)
Anion gap: 9 (ref 5–15)
BUN: 11 mg/dL (ref 8–23)
CO2: 21 mmol/L — ABNORMAL LOW (ref 22–32)
Calcium: 8.9 mg/dL (ref 8.9–10.3)
Chloride: 108 mmol/L (ref 98–111)
Creatinine, Ser: 1.34 mg/dL — ABNORMAL HIGH (ref 0.44–1.00)
GFR, Estimated: 41 mL/min — ABNORMAL LOW (ref >60)
Glucose, Bld: 124 mg/dL — ABNORMAL HIGH (ref 70–99)
Potassium: 3.9 mmol/L (ref 3.5–5.1)
Sodium: 138 mmol/L (ref 135–145)
Total Bilirubin: 0.8 mg/dL (ref 0.3–1.2)
Total Protein: 7.3 g/dL (ref 6.5–8.1)

## 2020-03-13 LAB — URINALYSIS, ROUTINE W REFLEX MICROSCOPIC
Bilirubin Urine: NEGATIVE
Glucose, UA: NEGATIVE mg/dL
Hgb urine dipstick: NEGATIVE
Ketones, ur: NEGATIVE mg/dL
Nitrite: POSITIVE — AB
Protein, ur: >=300 mg/dL — AB
Specific Gravity, Urine: 1.024 (ref 1.005–1.030)
WBC, UA: >50 WBC/hpf — ABNORMAL HIGH (ref 0–5)
pH: 5 (ref 5.0–8.0)

## 2020-03-13 LAB — CBC
HCT: 43.2 % (ref 36.0–46.0)
Hemoglobin: 12.9 g/dL (ref 12.0–15.0)
MCH: 28.4 pg (ref 26.0–34.0)
MCHC: 29.9 g/dL — ABNORMAL LOW (ref 30.0–36.0)
MCV: 95.2 fL (ref 80.0–100.0)
Platelets: 232 10*3/uL (ref 150–400)
RBC: 4.54 MIL/uL (ref 3.87–5.11)
RDW: 14.2 % (ref 11.5–15.5)
WBC: 10.7 10*3/uL — ABNORMAL HIGH (ref 4.0–10.5)
nRBC: 0 % (ref 0.0–0.2)

## 2020-03-13 LAB — LIPASE, BLOOD: Lipase: 17 U/L (ref 11–51)

## 2020-03-13 MED ORDER — ONDANSETRON 4 MG PO TBDP
4.0000 mg | ORAL_TABLET | Freq: Once | ORAL | Status: AC | PRN
  Administered 2020-03-13: 22:00:00 4 mg via ORAL
  Filled 2020-03-13: qty 1

## 2020-03-13 MED ORDER — ACETAMINOPHEN 325 MG PO TABS
650.0000 mg | ORAL_TABLET | Freq: Once | ORAL | Status: AC
  Administered 2020-03-13: 22:00:00 650 mg via ORAL
  Filled 2020-03-13: qty 2

## 2020-03-13 NOTE — ED Triage Notes (Signed)
Pt to ED with c/o lower abd pain x's 4 days.  Also st's she has had urinary frequency with small amounts.  Pt also c/o nausea and vomiting.

## 2020-03-14 ENCOUNTER — Inpatient Hospital Stay (HOSPITAL_COMMUNITY)
Admission: EM | Admit: 2020-03-14 | Discharge: 2020-03-17 | DRG: 391 | Disposition: A | Discharge status: Home / Self Care | Payer: Medicare PPO | Attending: Internal Medicine | Admitting: Internal Medicine

## 2020-03-14 ENCOUNTER — Other Ambulatory Visit: Payer: Self-pay

## 2020-03-14 ENCOUNTER — Encounter (HOSPITAL_COMMUNITY): Payer: Self-pay

## 2020-03-14 ENCOUNTER — Emergency Department (HOSPITAL_COMMUNITY): Payer: Medicare PPO

## 2020-03-14 DIAGNOSIS — E119 Type 2 diabetes mellitus without complications: Secondary | ICD-10-CM | POA: Diagnosis not present

## 2020-03-14 DIAGNOSIS — Z7982 Long term (current) use of aspirin: Secondary | ICD-10-CM

## 2020-03-14 DIAGNOSIS — I10 Essential (primary) hypertension: Secondary | ICD-10-CM | POA: Diagnosis not present

## 2020-03-14 DIAGNOSIS — I81 Portal vein thrombosis: Secondary | ICD-10-CM | POA: Diagnosis present

## 2020-03-14 DIAGNOSIS — Z8249 Family history of ischemic heart disease and other diseases of the circulatory system: Secondary | ICD-10-CM

## 2020-03-14 DIAGNOSIS — E039 Hypothyroidism, unspecified: Secondary | ICD-10-CM | POA: Diagnosis present

## 2020-03-14 DIAGNOSIS — Z79890 Hormone replacement therapy (postmenopausal): Secondary | ICD-10-CM | POA: Diagnosis not present

## 2020-03-14 DIAGNOSIS — I428 Other cardiomyopathies: Secondary | ICD-10-CM | POA: Diagnosis present

## 2020-03-14 DIAGNOSIS — Z79899 Other long term (current) drug therapy: Secondary | ICD-10-CM

## 2020-03-14 DIAGNOSIS — N1832 Chronic kidney disease, stage 3b: Secondary | ICD-10-CM | POA: Diagnosis present

## 2020-03-14 DIAGNOSIS — K5732 Diverticulitis of large intestine without perforation or abscess without bleeding: Secondary | ICD-10-CM | POA: Diagnosis present

## 2020-03-14 DIAGNOSIS — Z7984 Long term (current) use of oral hypoglycemic drugs: Secondary | ICD-10-CM

## 2020-03-14 DIAGNOSIS — N390 Urinary tract infection, site not specified: Secondary | ICD-10-CM | POA: Diagnosis present

## 2020-03-14 DIAGNOSIS — F17210 Nicotine dependence, cigarettes, uncomplicated: Secondary | ICD-10-CM | POA: Diagnosis present

## 2020-03-14 DIAGNOSIS — E1169 Type 2 diabetes mellitus with other specified complication: Secondary | ICD-10-CM | POA: Diagnosis present

## 2020-03-14 DIAGNOSIS — I130 Hypertensive heart and chronic kidney disease with heart failure and stage 1 through stage 4 chronic kidney disease, or unspecified chronic kidney disease: Secondary | ICD-10-CM | POA: Diagnosis present

## 2020-03-14 DIAGNOSIS — K5792 Diverticulitis of intestine, part unspecified, without perforation or abscess without bleeding: Secondary | ICD-10-CM

## 2020-03-14 DIAGNOSIS — Z83438 Family history of other disorder of lipoprotein metabolism and other lipidemia: Secondary | ICD-10-CM | POA: Diagnosis not present

## 2020-03-14 DIAGNOSIS — E785 Hyperlipidemia, unspecified: Secondary | ICD-10-CM | POA: Diagnosis present

## 2020-03-14 DIAGNOSIS — E1122 Type 2 diabetes mellitus with diabetic chronic kidney disease: Secondary | ICD-10-CM | POA: Diagnosis present

## 2020-03-14 DIAGNOSIS — Z833 Family history of diabetes mellitus: Secondary | ICD-10-CM | POA: Diagnosis not present

## 2020-03-14 DIAGNOSIS — Z791 Long term (current) use of non-steroidal anti-inflammatories (NSAID): Secondary | ICD-10-CM | POA: Diagnosis not present

## 2020-03-14 DIAGNOSIS — I5042 Chronic combined systolic (congestive) and diastolic (congestive) heart failure: Secondary | ICD-10-CM | POA: Diagnosis present

## 2020-03-14 DIAGNOSIS — I2510 Atherosclerotic heart disease of native coronary artery without angina pectoris: Secondary | ICD-10-CM | POA: Diagnosis present

## 2020-03-14 DIAGNOSIS — Z20822 Contact with and (suspected) exposure to covid-19: Secondary | ICD-10-CM | POA: Diagnosis present

## 2020-03-14 DIAGNOSIS — Z95810 Presence of automatic (implantable) cardiac defibrillator: Secondary | ICD-10-CM

## 2020-03-14 DIAGNOSIS — E890 Postprocedural hypothyroidism: Secondary | ICD-10-CM | POA: Diagnosis present

## 2020-03-14 DIAGNOSIS — Z886 Allergy status to analgesic agent status: Secondary | ICD-10-CM

## 2020-03-14 DIAGNOSIS — E1151 Type 2 diabetes mellitus with diabetic peripheral angiopathy without gangrene: Secondary | ICD-10-CM | POA: Diagnosis present

## 2020-03-14 LAB — RESP PANEL BY RT-PCR (FLU A&B, COVID) ARPGX2
Influenza A by PCR: NEGATIVE
Influenza B by PCR: NEGATIVE
SARS Coronavirus 2 by RT PCR: NEGATIVE

## 2020-03-14 LAB — HEPARIN LEVEL (UNFRACTIONATED): Heparin Unfractionated: <0.1 IU/mL — ABNORMAL LOW (ref 0.30–0.70)

## 2020-03-14 LAB — GLUCOSE, CAPILLARY
Glucose-Capillary: 146 mg/dL — ABNORMAL HIGH (ref 70–99)
Glucose-Capillary: 73 mg/dL (ref 70–99)

## 2020-03-14 LAB — HEMOGLOBIN A1C
Hgb A1c MFr Bld: 6 % — ABNORMAL HIGH (ref 4.8–5.6)
Mean Plasma Glucose: 125.5 mg/dL

## 2020-03-14 MED ORDER — ISOSORBIDE MONONITRATE ER 30 MG PO TB24
30.0000 mg | ORAL_TABLET | Freq: Every day | ORAL | Status: DC
  Administered 2020-03-17: 10:00:00 30 mg via ORAL
  Filled 2020-03-14 (×3): qty 1

## 2020-03-14 MED ORDER — PIPERACILLIN-TAZOBACTAM 3.375 G IVPB 30 MIN
3.3750 g | Freq: Once | INTRAVENOUS | Status: AC
  Administered 2020-03-14: 11:00:00 3.375 g via INTRAVENOUS
  Filled 2020-03-14: qty 50

## 2020-03-14 MED ORDER — PIPERACILLIN-TAZOBACTAM 3.375 G IVPB 30 MIN
3.3750 g | Freq: Once | INTRAVENOUS | Status: AC
  Administered 2020-03-14: 17:00:00 3.375 g via INTRAVENOUS
  Filled 2020-03-14: qty 50

## 2020-03-14 MED ORDER — LEVOTHYROXINE SODIUM 25 MCG PO TABS
125.0000 ug | ORAL_TABLET | Freq: Every day | ORAL | Status: DC
  Administered 2020-03-15 – 2020-03-17 (×3): 125 ug via ORAL
  Filled 2020-03-14 (×3): qty 1

## 2020-03-14 MED ORDER — HYDROCODONE-ACETAMINOPHEN 5-325 MG PO TABS: 1.0000 | ORAL_TABLET | ORAL | Status: DC | PRN

## 2020-03-14 MED ORDER — ONDANSETRON HCL 4 MG/2ML IJ SOLN
4.0000 mg | Freq: Once | INTRAMUSCULAR | Status: AC
  Administered 2020-03-14: 07:00:00 4 mg via INTRAVENOUS
  Filled 2020-03-14: qty 2

## 2020-03-14 MED ORDER — HEPARIN (PORCINE) 25000 UT/250ML-% IV SOLN
1350.0000 [IU]/h | INTRAVENOUS | Status: DC
  Administered 2020-03-14 – 2020-03-15 (×2): 1350 [IU]/h via INTRAVENOUS
  Filled 2020-03-14 (×2): qty 250

## 2020-03-14 MED ORDER — PIPERACILLIN-TAZOBACTAM 3.375 G IVPB
3.3750 g | Freq: Three times a day (TID) | INTRAVENOUS | Status: DC
  Administered 2020-03-14 – 2020-03-17 (×8): 3.375 g via INTRAVENOUS
  Filled 2020-03-14 (×10): qty 50

## 2020-03-14 MED ORDER — INSULIN ASPART 100 UNIT/ML ~~LOC~~ SOLN
0.0000 [IU] | Freq: Every day | SUBCUTANEOUS | Status: DC
  Filled 2020-03-14: qty 0.05

## 2020-03-14 MED ORDER — IOHEXOL 300 MG/ML  SOLN
80.0000 mL | Freq: Once | INTRAMUSCULAR | Status: AC | PRN
  Administered 2020-03-14: 08:00:00 80 mL via INTRAVENOUS

## 2020-03-14 MED ORDER — ONDANSETRON HCL 4 MG PO TABS: 4.0000 mg | ORAL_TABLET | Freq: Four times a day (QID) | ORAL | Status: DC | PRN

## 2020-03-14 MED ORDER — SODIUM CHLORIDE 0.9 % IV BOLUS
250.0000 mL | Freq: Once | INTRAVENOUS | Status: AC
  Administered 2020-03-14: 07:00:00 250 mL via INTRAVENOUS

## 2020-03-14 MED ORDER — ONDANSETRON HCL 4 MG/2ML IJ SOLN: 4.0000 mg | Freq: Four times a day (QID) | INTRAMUSCULAR | Status: DC | PRN

## 2020-03-14 MED ORDER — SACUBITRIL-VALSARTAN 49-51 MG PO TABS
1.0000 | ORAL_TABLET | Freq: Two times a day (BID) | ORAL | Status: DC
  Administered 2020-03-14 – 2020-03-17 (×4): 1 via ORAL
  Filled 2020-03-14 (×6): qty 1

## 2020-03-14 MED ORDER — SPIRONOLACTONE 25 MG PO TABS
50.0000 mg | ORAL_TABLET | Freq: Every day | ORAL | Status: DC
  Administered 2020-03-17: 10:00:00 50 mg via ORAL
  Filled 2020-03-14 (×3): qty 2

## 2020-03-14 MED ORDER — ATORVASTATIN CALCIUM 40 MG PO TABS
40.0000 mg | ORAL_TABLET | Freq: Every day | ORAL | Status: DC
  Administered 2020-03-14 – 2020-03-16 (×3): 40 mg via ORAL
  Filled 2020-03-14 (×3): qty 1

## 2020-03-14 MED ORDER — ASPIRIN EC 81 MG PO TBEC
81.0000 mg | DELAYED_RELEASE_TABLET | Freq: Every day | ORAL | Status: DC
  Administered 2020-03-15 – 2020-03-16 (×2): 81 mg via ORAL
  Filled 2020-03-14 (×2): qty 1

## 2020-03-14 MED ORDER — CARVEDILOL 12.5 MG PO TABS
25.0000 mg | ORAL_TABLET | Freq: Two times a day (BID) | ORAL | Status: DC
  Administered 2020-03-14 – 2020-03-17 (×4): 25 mg via ORAL
  Filled 2020-03-14 (×6): qty 2

## 2020-03-14 MED ORDER — FENTANYL CITRATE (PF) 100 MCG/2ML IJ SOLN
50.0000 ug | Freq: Once | INTRAMUSCULAR | Status: AC
  Administered 2020-03-14: 07:00:00 50 ug via INTRAVENOUS
  Filled 2020-03-14: qty 2

## 2020-03-14 MED ORDER — FUROSEMIDE 40 MG PO TABS
40.0000 mg | ORAL_TABLET | Freq: Two times a day (BID) | ORAL | Status: DC
  Administered 2020-03-14 – 2020-03-17 (×5): 40 mg via ORAL
  Filled 2020-03-14 (×6): qty 1

## 2020-03-14 MED ORDER — SIMETHICONE 80 MG PO CHEW
80.0000 mg | CHEWABLE_TABLET | Freq: Once | ORAL | Status: AC
  Administered 2020-03-14: 20:00:00 80 mg via ORAL
  Filled 2020-03-14: qty 1

## 2020-03-14 MED ORDER — MORPHINE SULFATE (PF) 2 MG/ML IV SOLN
2.0000 mg | INTRAVENOUS | Status: DC | PRN
  Administered 2020-03-14 – 2020-03-16 (×5): 2 mg via INTRAVENOUS
  Filled 2020-03-14 (×5): qty 1

## 2020-03-14 MED ORDER — SODIUM CHLORIDE (PF) 0.9 % IJ SOLN
INTRAMUSCULAR | Status: AC
  Filled 2020-03-14: qty 50

## 2020-03-14 MED ORDER — INSULIN ASPART 100 UNIT/ML ~~LOC~~ SOLN
0.0000 [IU] | Freq: Three times a day (TID) | SUBCUTANEOUS | Status: DC
  Administered 2020-03-14 – 2020-03-17 (×3): 2 [IU] via SUBCUTANEOUS
  Filled 2020-03-14: qty 0.15

## 2020-03-14 MED ORDER — HEPARIN BOLUS VIA INFUSION
2500.0000 [IU] | Freq: Once | INTRAVENOUS | Status: AC
  Administered 2020-03-14: 11:00:00 2500 [IU] via INTRAVENOUS
  Filled 2020-03-14: qty 2500

## 2020-03-14 NOTE — H&P (Addendum)
History and Physical    Paula Stein ERX:[id number] DOB: 1945/08/10 DOA: 03/14/2020  PCP: [name], MD  Patient coming from: Home  Chief Complaint: abdominal pain.  HPI: Paula Stein is a 74 y.o. female with medical history significant of DM2, HTN, Hypothyroidism. Presenting with abdominal pain for last 4 days. Started while she was driving. It was sharp and in the lower quadrants. It was sudden onset. It caused her to lose her bowel control initially. Over the next couple of days, it became associated with N/V. She tried pepto bismol to help, but it did not. She was going to try imodium, but she did not have any other loose stools after the first day. This morning as it continued to worsen, she felt that she needed to come to the ED.     ED Course: CT ab/pelivs showed portal vein thrombosis and diverticulitis. UA was concerning for UTI. [facility] was consulted. They recommended admission. [facility] was called for admission.   Review of Systems:  Denies CP, palpitations, dyspnea, HA, dizziness, hematemesis, hematochezia. Review of systems is otherwise negative for all not mentioned in HPI.   PMHx Past Medical History:  Diagnosis Date  . AICD (automatic cardioverter/defibrillator) present 10/08/2014   SUBQ    /    DR [name]  . CHF (congestive heart failure) ([place])   . Diabetes mellitus without complication ([place])   . History of cardiac cath 05/2007   normal-with patent coronaries  . History of colonoscopy   . History of hiatal hernia   . Hypertension   . Iatrogenic thyroiditis   . Non-ischemic cardiomyopathy ([place])    EF 28%- reassessment of LV function 2011 with LVEf 45-50%  . PAD (peripheral artery disease) ([facility])    lower extremities with ABIs of 0.5 bilaterally  . Personal history of goiter   . S/P radioactive iodine thyroid ablation   . S/P thyroidectomy   . Shortness of breath dyspnea     PSHx Past Surgical History:  Procedure Laterality Date  . EP IMPLANTABLE DEVICE N/A  10/08/2014   Procedure: SubQ ICD Implant;  Surgeon: [name], MD;  Location: [facility];  Service: Cardiovascular;  Laterality: N/A;  . SUBQ ICD CHANGEOUT N/A 11/14/2019   Procedure: PJKD ICD CHANGEOUT;  Surgeon: [name], MD;  Location: [facility];  Service: Cardiovascular;  Laterality: N/A;  . THYROIDECTOMY    . TONSILLECTOMY    . TOTAL ABDOMINAL HYSTERECTOMY      SocHx  reports that she has been smoking cigarettes. She has a 10.00 pack-year smoking history. She has never used smokeless tobacco. She reports current alcohol use. She reports that she does not use drugs.  Allergies  Allergen Reactions  . Ibuprofen Nausea Only and Other (See Comments)    FamHx Family History  Problem Relation Age of Onset  . Diabetes type II Mother   . Hypertension Mother   . Diabetes Mother   . Heart disease Mother   . Other Father        deceased from accident age 66  . Hyperlipidemia Father   . Breast cancer Neg Hx   . Stomach cancer Neg Hx   . Colon cancer Neg Hx   . Esophageal cancer Neg Hx   . Pancreatic cancer Neg Hx     Prior to Admission medications   Medication Sig Start Date End Date Taking? Authorizing Provider  Accu-Chek Softclix Lancets lancets Use to check blood sugar daily 02/06/20  [name], MD  acetaminophen (TYLENOL) 500 MG tablet Take 1 tablet (500 mg total) by mouth every 6 (six) hours as needed. Patient taking differently: Take 500 mg by mouth every 6 (six) hours as needed for mild pain.  09/29/18   Zigmund Gottron, NP  allopurinol (ZYLOPRIM) 100 MG tablet Take 100 mg by mouth daily. 03/31/19   [provider]  aspirin 81 MG tablet Take 81 mg by mouth daily.      [provider]  atorvastatin (LIPITOR) 40 MG tablet Take 1 tablet (40 mg total) by mouth daily. 01/16/20   [name], MD  Blood Glucose Monitoring Suppl (ACCU-CHEK GUIDE) w/Device KIT 1 each by Does not apply route daily. Use to check blood sugar daily  11/26/19   [name], MD  carvedilol (COREG) 25 MG tablet Take 1 tablet (25 mg total) by mouth 2 (two) times daily with a meal. 01/23/20   [name], MD  furosemide (LASIX) 40 MG tablet Take 1 tablet (40 mg total) by mouth 2 (two) times daily. 01/23/20   [name], MD  glipiZIDE (GLUCOTROL XL) 2.5 MG 24 hr tablet Take 1 tablet (2.5 mg total) by mouth daily with breakfast. 02/06/20   [name], MD  glucose blood (ACCU-CHEK GUIDE) test strip Use to check blood sugar daily 01/27/20   [name], MD  hydroxychloroquine (PLAQUENIL) 200 MG tablet Take 200 mg by mouth daily as needed (feet swelling).  11/01/17   [provider]  isosorbide mononitrate (IMDUR) 30 MG 24 hr tablet TAKE 1 TABLET(30 MG) BY MOUTH DAILY Patient taking differently: Take 30 mg by mouth daily.  05/14/19   [name], MD  levothyroxine (SYNTHROID) 125 MCG tablet Take 1 tab by mouth every morning( on empty stomach) 30 minutes before taking other medications or before breakfast 02/06/20   [name], MD  omeprazole (PRILOSEC) 40 MG capsule Take 1 capsule (40 mg total) by mouth 2 (two) times daily before a meal. Patient taking differently: Take 40 mg by mouth daily as needed (acid reflux).  07/24/19   Levin Erp, PA  sacubitril-valsartan (ENTRESTO) 49-51 MG Take 1 tablet by mouth 2 (two) times daily. 01/23/20   [name], MD  spironolactone (ALDACTONE) 50 MG tablet TAKE 1 TABLET(50 MG) BY MOUTH DAILY. Please make yearly appt with Dr. Burt Knack for November before anymore refills. Thank you 1st attempt 01/22/20   [name], MD    Physical Exam: Vitals:   03/14/20 0700 03/14/20 0730 03/14/20 0832 03/14/20 0930  BP: (!) 169/72 (!) 162/74 (!) 143/66 (!) 141/63  Pulse: 77 72 70 72  Resp: (!) 26 20 (!) 21 (!) 22  Temp:      TempSrc:      SpO2: 99% 95% 99% 96%  Weight:      Height:        General: 74 y.o. female resting in bed in NAD Eyes: PERRL, normal sclera ENMT:  Nares patent w/o discharge, orophaynx clear, dentition normal, ears w/o discharge/lesions/ulcers Neck: Supple, trachea midline Cardiovascular: RRR, +S1, S2, no m/g/r, equal pulses throughout Respiratory: CTABL, no w/r/r, normal WOB GI: BS+, ND, TTP RLQ, LLQ, no masses noted, no organomegaly noted MSK: No e/c/c Skin: No rashes, bruises, ulcerations noted Neuro: A&O x 3, no focal deficits Psyc: Appropriate interaction and affect, calm/cooperative  Labs on Admission: I have personally reviewed following labs and imaging studies  CBC: Recent Labs  Lab 03/13/20 2218  WBC  10.7*  HGB 12.9  HCT 43.2  MCV 95.2  PLT 867   Basic Metabolic Panel: Recent Labs  Lab 03/13/20 2218  NA 138  K 3.9  CL 108  CO2 21*  GLUCOSE 124*  BUN 11  CREATININE 1.34*  CALCIUM 8.9   GFR: Estimated Creatinine Clearance: 44.7 mL/min (A) (by C-G formula based on SCr of 1.34 mg/dL (H)). Liver Function Tests: Recent Labs  Lab 03/13/20 2218  AST 15  ALT 13  ALKPHOS 103  BILITOT 0.8  PROT 7.3  ALBUMIN 3.1*   Recent Labs  Lab 03/13/20 2218  LIPASE 17   No results for input(s): AMMONIA in the last 168 hours. Coagulation Profile: No results for input(s): INR, PROTIME in the last 168 hours. Cardiac Enzymes: No results for input(s): CKTOTAL, CKMB, CKMBINDEX, TROPONINI in the last 168 hours. BNP (last 3 results) No results for input(s): PROBNP in the last 8760 hours. HbA1C: No results for input(s): HGBA1C in the last 72 hours. CBG: No results for input(s): GLUCAP in the last 168 hours. Lipid Profile: No results for input(s): CHOL, HDL, LDLCALC, TRIG, CHOLHDL, LDLDIRECT in the last 72 hours. Thyroid Function Tests: No results for input(s): TSH, T4TOTAL, FREET4, T3FREE, THYROIDAB in the last 72 hours. Anemia Panel: No results for input(s): VITAMINB12, FOLATE, FERRITIN, TIBC, IRON, RETICCTPCT in the last 72 hours. Urine analysis:    Component Value Date/Time   COLORURINE AMBER (A) 03/13/2020  2212   APPEARANCEUR CLOUDY (A) 03/13/2020 2212   LABSPEC 1.024 03/13/2020 2212   PHURINE 5.0 03/13/2020 2212   GLUCOSEU NEGATIVE 03/13/2020 2212   HGBUR NEGATIVE 03/13/2020 2212   BILIRUBINUR NEGATIVE 03/13/2020 2212   BILIRUBINUR neg 07/14/2019 0936   KETONESUR NEGATIVE 03/13/2020 2212   PROTEINUR >=300 (A) 03/13/2020 2212   UROBILINOGEN 0.2 07/14/2019 0936   UROBILINOGEN 0.2 01/28/2007 1647   NITRITE POSITIVE (A) 03/13/2020 2212   LEUKOCYTESUR TRACE (A) 03/13/2020 2212    Radiological Exams on Admission: CT ABDOMEN PELVIS W CONTRAST  Result Date: 03/14/2020 CLINICAL DATA:  Acute non localized abdominal pain for the past 4 days. EXAM: CT ABDOMEN AND PELVIS WITH CONTRAST TECHNIQUE: Multidetector CT imaging of the abdomen and pelvis was performed using the standard protocol following bolus administration of intravenous contrast. CONTRAST:  70m OMNIPAQUE IOHEXOL 300 MG/ML  SOLN COMPARISON:  07/25/2019; chest radiograph-07/04/2019 FINDINGS: Lower chest: Limited visualization of the lower thorax demonstrates interstitial opacities within the imaged lung bases with associated minimal bronchiectasis but without evidence of honeycombing. Cardiomegaly. Trace amount of pericardial fluid, presumably physiologic. A defibrillator pack is imbedded within the soft tissues of the left lateral chest. Hepatobiliary: Normal hepatic contour. Redemonstrated multiple hypoattenuating nonenhancing hepatic cysts, the largest of which within the medial aspect of the lateral segment of the left lobe of the liver measuring approximately 3.8 cm in diameter (image 25, series 2). No discrete worrisome hepatic lesions. There is occlusion of the right portal vein (representative axial image 21, series 2; coronal image 74, series 5) with associated cavernous transformation and slight atrophy of the posterior segment of the right lobe of the liver in comparison to the left, new compared to the 07/05/2019 examination. The  etiology of this apparent occlusion is not depicted on this examination. No ascites. Normal appearance of the gallbladder given degree distention. Pancreas: Normal appearance of the pancreas. Spleen: Normal appearance of the spleen. Adrenals/Urinary Tract: There is symmetric enhancement and excretion of the bilateral kidneys. Note is made of a macroscopic fat containing approximately 2.3 cm angiomyolipoma  involving from the inferior medial aspect the left kidney (image 37, series 2). Renal cysts are seen bilaterally with dominant right-sided renal cyst measuring 3.8 cm in diameter (image 15, series 2 and index left renal cyst measuring 3.9 cm (image 31, series 2). Additional subcentimeter hypoattenuating renal lesions are too small to accurately characterize though favored to represent additional renal cysts. No urinary obstruction or perinephric stranding. Normal appearance the bilateral adrenal glands. Normal appearance of the urinary bladder given degree distention. Stomach/Bowel: Extensive diverticulosis involving the majority of the descending and sigmoid colon. There is wall thickening and ill-defined stranding about the sigmoid colon within the left lower abdomen/pelvis (representative image 67, series 2, progressed compared to the 07/2019 examination and worrisome for an area of acute diverticulitis. There is a small amount of fluid is seen within the pelvic cul-de-sac without evidence of perforation or definable/drainable fluid collection. No evidence of perforation or definable/drainable fluid collection. Small hiatal hernia. No evidence of enteric obstruction. Normal appearance of the terminal ileum and the retrocecal appendix. No pneumoperitoneum, pneumatosis or portal venous gas. Vascular/Lymphatic: Scattered atherosclerotic plaque within normal caliber abdominal aorta. Scattered lymph nodes centered within the left hemipelvis are presumably reactive in etiology with index left common iliac chain lymph  node measuring 1.1 cm in greatest short axis diameter image 54, series 2 and index left pelvic sidewall lymph node measuring 1.1 cm (image 64, series 2). Otherwise, no bulky retroperitoneal, mesenteric, pelvic or inguinal lymphadenopathy. Reproductive: Post hysterectomy.  No discrete adnexal lesion. Other: Minimal amount of subcutaneous edema about the midline of the low back. Musculoskeletal: No acute or aggressive osseous abnormalities. Moderate DDD of L5-S1 with disc space height loss, endplate irregularity and sclerosis. Mild-to-moderate degenerative change the bilateral hips, right greater than left, with joint space loss, subchondral sclerosis and osteophytosis. IMPRESSION: 1. Findings worrisome for acute uncomplicated diverticulitis involving the sigmoid colon within the left lower abdomen/pelvis. No evidence of perforation or definable/drainable fluid collection. If not recently performed, further evaluation with colonoscopy after the resolution of acute symptoms is advised to exclude the presence of an underlying mass. 2. 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 3. Small hiatal hernia. 4. Bilateral renal cysts. 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 5.  Aortic Atherosclerosis (ICD10-I70.0). Electronically Signed   By: Sandi Mariscal M.D.   On: 03/14/2020 08:51   Assessment/Plan Acute diverticulitis     - admit to inpatient, tele     - would typically start off w/ bowel rest or CLD; but she is adamant  about eating. Will start with FLD and advance as tolerated     - anti-emetics, pain control     - watch fluid intake d/t CM/CHF     - started on zosyn, continue  UTI     - UCx pending     - started on zosyn for diverticulitis and UTI, narrow as able  Portal vein thrombosis     - EDP spoke with [facility]; recommended starting heparin gtt     - she is now on heparin gtt; spoke with [facility] (Dr.  Magrinat); will follow up outpt in 2 weeks, send jak2 mutation, prothrombin mutation, factor v leiden  HTN     - continue home mes  CKD3b     - baseline Scr looks to be 1.2 - 1.4. She is 1.34 at admission. Watch nephrotoxins. Follow.   Hypohtyroidism     - resume synthroid  DVT prophylaxis: heparin gtt  Code Status: DNI, confirmed at bedside with pt/son  Family Communication: w/ son at bedside  Consults called: EDP spoke with [facility]  Status is: Inpatient  Remains inpatient appropriate because:Inpatient level of care appropriate due to severity of illness   Dispo: The patient is from: Home              Anticipated d/c is to: Home              Anticipated d/c date is: 3 days              Patient currently is not medically stable to d/c.  Jonnie Finner DO [facility]  If 7PM-7AM, please contact night-coverage [email or web address]  03/14/2020, 10:35 AM

## 2020-03-14 NOTE — Progress Notes (Signed)
Pharmacy - IV heparin  Assessment:    Please see note from Arlyn Dunning, PharmD earlier today for full details.  Briefly, 74 y.o. female on IV heparin for portal vein thrombus.   Initial heparin level undetectable on 1350 units/hr. RN notes patient was transferred to floor with IV access issues, and that heparin was essentially off for the 2 hrs prior to level being drawn  Plan:   Continue heparin at 1350 units/hr  Recheck heparin level at midnight  Reuel Boom, PharmD, [place] 03/14/2020, 6:57 PM

## 2020-03-14 NOTE — ED Notes (Signed)
Pt stated she has forgotten to take her daily medicine for the past two days

## 2020-03-14 NOTE — ED Notes (Signed)
IV attempt for 2nd line to left forearm and left hand. IV team consult ordered.

## 2020-03-14 NOTE — Progress Notes (Signed)
PHARMACY NOTE -  [facility] has been assisting with dosing of Zosyn for IAI/UTI.  Dosage remains stable at 3.375g IV q8 hr; further dosage adjustments per standing pharmacy renal abx adjustment protocol  Pharmacy will sign off, following peripherally for culture results or dose adjustments. Please reconsult if a change in clinical status warrants re-evaluation of dosage.  Reuel Boom, PharmD, BCPS [phone] 03/14/2020, 12:14 PM

## 2020-03-14 NOTE — ED Notes (Signed)
Patient transported to CT 

## 2020-03-14 NOTE — ED Notes (Signed)
Patient provided [place] ice and ginger ale

## 2020-03-14 NOTE — ED Triage Notes (Addendum)
Pt reports lower abdominal pain x 4 days. Urinary frequency and one episode of being unable to control bladder. Pt reports blood and urinalysis obtained at [facility] pta.

## 2020-03-14 NOTE — ED Notes (Signed)
Son to room.

## 2020-03-14 NOTE — ED Notes (Signed)
Called 6E for report, nurse is busy at this time. Will call back

## 2020-03-14 NOTE — ED Provider Notes (Signed)
[facility] DEPT Provider Note   CSN: [id number] Arrival date & time: 03/14/20  7209     History Chief Complaint  Patient presents with  . Abdominal Pain    Paula Stein is a 74 y.o. female.  Paula Stein is a 74 y.o. female with a history of CHF, nonischemic cardiomyopathy, hypertension, PAD, diverticulitis, who presents to the ED for evaluation of abdominal pain.  She reports that this pain began initially 5 days ago on Wednesday.  She reported that she started having severe lower abdominal pains and then had an episode where she was incontinent of stool.  She reports pain seemed to ease off but has been intermittent over the last few days, yesterday pain got significantly worse.  She reports having some chills but no measured fevers at home.  She is also had some intermittent episodes of vomiting, most severe yesterday, and reports decreased appetite.  No melena or hematochezia.  No associated chest pain, shortness of breath or cough.  She does report some urinary frequency and that she will go very small amount, denies burning with urination or hematuria.  No flank pain.  She initially went to [facility] where they did lab work and urinalysis from triage, but due to very long wait time she left and came here for evaluation.  Previous hysterectomy but no other abdominal surgeries.        Past Medical History:  Diagnosis Date  . AICD (automatic cardioverter/defibrillator) present 10/08/2014   SUBQ    /    DR [name]  . CHF (congestive heart failure) ([place])   . Diabetes mellitus without complication ([place])   . History of cardiac cath 05/2007   normal-with patent coronaries  . History of colonoscopy   . History of hiatal hernia   . Hypertension   . Iatrogenic thyroiditis   . Non-ischemic cardiomyopathy ([place])    EF 28%- reassessment of LV function 2011 with LVEf 45-50%  . PAD (peripheral artery disease) ([facility])    lower extremities with ABIs of 0.5  bilaterally  . Personal history of goiter   . S/P radioactive iodine thyroid ablation   . S/P thyroidectomy   . Shortness of breath dyspnea     Patient Active Problem List   Diagnosis Date Noted  . Acute on chronic combined systolic and diastolic CHF (congestive heart failure) ([place]) 03/13/2018  . Non-ischemic cardiomyopathy ([place])   . Hypertension   . Diabetes mellitus without complication ([place])   . CAD (coronary artery disease) 06/18/2017  . PAD (peripheral artery disease) ([place]) 06/18/2017  . ICD (implantable cardioverter-defibrillator) in place 06/18/2017  . Chronic combined systolic and diastolic CHF (congestive heart failure) ([place]) 05/22/2017  . Hyperlipidemia associated with type 2 diabetes mellitus ([place]) 12/11/2016  . Treatment-emergent central sleep apnea 09/20/2016  . NICM (nonischemic cardiomyopathy) ([place]) 10/08/2014  . AICD (automatic cardioverter/defibrillator) present 10/08/2014  . Cigarette smoker 10/30/2012  . 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 DeQuervain's disease (tenosynovitis) 09/01/2010  . Atherosclerosis 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 ([place]) 12/07/2008  . Diabetes ([place]) 10/29/2008  . Hypothyroidism 01/31/2007  . Essential hypertension 01/31/2007  . GERD 01/31/2007    Past Surgical History:  Procedure Laterality Date  . EP IMPLANTABLE DEVICE N/A 10/08/2014   Procedure: SubQ ICD Implant;  Surgeon: [name], MD;  Location: [facility];  Service: Cardiovascular;  Laterality: N/A;  . SUBQ ICD CHANGEOUT N/A 11/14/2019   Procedure: SUBQ ICD CHANGEOUT;  Surgeon:  [name], MD;  Location: [facility];  Service: Cardiovascular;  Laterality: N/A;  . THYROIDECTOMY    . TONSILLECTOMY    . TOTAL ABDOMINAL HYSTERECTOMY       OB History   No obstetric history on file.     Family History  Problem Relation Age of Onset  . Diabetes type II Mother   . Hypertension Mother   . Diabetes Mother   . Heart  disease Mother   . Other Father        deceased from accident age 76  . Hyperlipidemia Father   . Breast cancer Neg Hx   . Stomach cancer Neg Hx   . Colon cancer Neg Hx   . Esophageal cancer Neg Hx   . Pancreatic cancer Neg Hx     Social History   Tobacco Use  . Smoking status: Light Tobacco Smoker    Packs/day: 0.25    Years: 40.00    Pack years: 10.00    Types: Cigarettes  . Smokeless tobacco: Never Used  . Tobacco comment: on and off   Vaping Use  . Vaping Use: Never used  Substance Use Topics  . Alcohol use: Yes    Alcohol/week: 0.0 standard drinks    Comment: rare glass of wine  . Drug use: No    Home Medications Prior to Admission medications   Medication Sig Start Date End Date Taking? Authorizing Provider  Accu-Chek Softclix Lancets lancets Use to check blood sugar daily 02/06/20   [name], MD  acetaminophen (TYLENOL) 500 MG tablet Take 1 tablet (500 mg total) by mouth every 6 (six) hours as needed. Patient taking differently: Take 500 mg by mouth every 6 (six) hours as needed for mild pain.  09/29/18   Zigmund Gottron, NP  allopurinol (ZYLOPRIM) 100 MG tablet Take 100 mg by mouth daily. 03/31/19   [provider]  aspirin 81 MG tablet Take 81 mg by mouth daily.      [provider]  atorvastatin (LIPITOR) 40 MG tablet Take 1 tablet (40 mg total) by mouth daily. 01/16/20   [name], MD  Blood Glucose Monitoring Suppl (ACCU-CHEK GUIDE) w/Device KIT 1 each by Does not apply route daily. Use to check blood sugar daily 11/26/19   [name], MD  carvedilol (COREG) 25 MG tablet Take 1 tablet (25 mg total) by mouth 2 (two) times daily with a meal. 01/23/20   [name], MD  furosemide (LASIX) 40 MG tablet Take 1 tablet (40 mg total) by mouth 2 (two) times daily. 01/23/20   [name], MD  glipiZIDE (GLUCOTROL XL) 2.5 MG 24 hr tablet Take 1 tablet (2.5 mg total) by mouth daily with breakfast. 02/06/20   [name], MD   glucose blood (ACCU-CHEK GUIDE) test strip Use to check blood sugar daily 01/27/20   [name], MD  hydroxychloroquine (PLAQUENIL) 200 MG tablet Take 200 mg by mouth daily as needed (feet swelling).  11/01/17   [provider]  isosorbide mononitrate (IMDUR) 30 MG 24 hr tablet TAKE 1 TABLET(30 MG) BY MOUTH DAILY Patient taking differently: Take 30 mg by mouth daily.  05/14/19   [name], MD  levothyroxine (SYNTHROID) 125 MCG tablet Take 1 tab by mouth every morning( on empty stomach) 30 minutes before taking other medications or before breakfast 02/06/20   [name], MD  omeprazole (PRILOSEC) 40 MG capsule Take 1 capsule (40 mg total) by  mouth 2 (two) times daily before a meal. Patient taking differently: Take 40 mg by mouth daily as needed (acid reflux).  07/24/19   Levin Erp, PA  sacubitril-valsartan (ENTRESTO) 49-51 MG Take 1 tablet by mouth 2 (two) times daily. 01/23/20   [name], MD  spironolactone (ALDACTONE) 50 MG tablet TAKE 1 TABLET(50 MG) BY MOUTH DAILY. Please make yearly appt with Dr. Burt Knack for November before anymore refills. Thank you 1st attempt 01/22/20   [name], MD    Allergies    Ibuprofen  Review of Systems   Review of Systems  Constitutional: Positive for chills. Negative for fever.  HENT: Negative.   Respiratory: Negative for cough and shortness of breath.   Cardiovascular: Negative for chest pain.  Gastrointestinal: Positive for abdominal pain, diarrhea, nausea and vomiting. Negative for blood in stool and constipation.  Genitourinary: Positive for frequency. Negative for dysuria, flank pain, hematuria, vaginal bleeding and vaginal discharge.  Musculoskeletal: Negative for arthralgias and myalgias.  Skin: Negative for color change and rash.  Neurological: Negative for dizziness, syncope and light-headedness.    Physical Exam Updated Vital Signs BP 139/66   Pulse 78   Temp 99 F (37.2 C) (Oral)   Resp 20    Ht 5' 10"  (1.778 m)   Wt 95.3 kg   SpO2 97%   BMI 30.13 kg/m   Physical Exam Vitals and nursing note reviewed.  Constitutional:      General: She is not in acute distress.    Appearance: She is well-developed and well-nourished. She is not ill-appearing or diaphoretic.     Comments: Elderly female, well-appearing and in no acute distress  HENT:     Head: Normocephalic and atraumatic.     Mouth/Throat:     Mouth: Oropharynx is clear and moist.     Comments: Mucous membranes slightly dry Eyes:     General:        Right eye: No discharge.        Left eye: No discharge.     Extraocular Movements: EOM normal.     Pupils: Pupils are equal, round, and reactive to light.  Cardiovascular:     Rate and Rhythm: Normal rate and regular rhythm.     Pulses: Intact distal pulses.     Heart sounds: Normal heart sounds. No murmur heard. No gallop.   Pulmonary:     Effort: Pulmonary effort is normal. No respiratory distress.     Breath sounds: Normal breath sounds. No wheezing or rales.     Comments: Respirations equal and unlabored, patient able to speak in full sentences, lungs clear to auscultation bilaterally  Abdominal:     General: Bowel sounds are normal. There is no distension.     Palpations: Abdomen is soft. There is no mass.     Tenderness: There is generalized abdominal tenderness. There is no guarding.     Comments: Abdomen is soft, nondistended, bowel sounds present throughout, there is generalized tenderness throughout the abdomen most severe in the lower abdomen with slight guarding, no CVA tenderness bilaterally  Musculoskeletal:        General: No deformity or edema.     Cervical back: Neck supple.  Skin:    General: Skin is warm and dry.     Capillary Refill: Capillary refill takes less than 2 seconds.  Neurological:     Mental Status: She is alert.     Coordination: Coordination normal.     Comments: Speech is clear, able  to follow commands Moves extremities without  ataxia, coordination intact  Psychiatric:        Mood and Affect: Mood normal.        Behavior: Behavior normal.     ED Results / Procedures / Treatments   Labs (all labs ordered are listed, but only abnormal results are displayed) Labs Reviewed  RESP PANEL BY RT-PCR (FLU A&B, COVID) [id number]  URINE CULTURE  HEPARIN LEVEL (UNFRACTIONATED)    EKG None  Radiology CT ABDOMEN PELVIS W CONTRAST  Result Date: 03/14/2020 CLINICAL DATA:  Acute non localized abdominal pain for the past 4 days. EXAM: CT ABDOMEN AND PELVIS WITH CONTRAST TECHNIQUE: Multidetector CT imaging of the abdomen and pelvis was performed using the standard protocol following bolus administration of intravenous contrast. CONTRAST:  71m OMNIPAQUE IOHEXOL 300 MG/ML  SOLN COMPARISON:  07/25/2019; chest radiograph-07/04/2019 FINDINGS: Lower chest: Limited visualization of the lower thorax demonstrates interstitial opacities within the imaged lung bases with associated minimal bronchiectasis but without evidence of honeycombing. Cardiomegaly. Trace amount of pericardial fluid, presumably physiologic. A defibrillator pack is imbedded within the soft tissues of the left lateral chest. Hepatobiliary: Normal hepatic contour. Redemonstrated multiple hypoattenuating nonenhancing hepatic cysts, the largest of which within the medial aspect of the lateral segment of the left lobe of the liver measuring approximately 3.8 cm in diameter (image 25, series 2). No discrete worrisome hepatic lesions. There is occlusion of the right portal vein (representative axial image 21, series 2; coronal image 74, series 5) with associated cavernous transformation and slight atrophy of the posterior segment of the right lobe of the liver in comparison to the left, new compared to the 07/05/2019 examination. The etiology of this apparent occlusion is not depicted on this examination. No ascites. Normal appearance of the gallbladder given degree distention.  Pancreas: Normal appearance of the pancreas. Spleen: Normal appearance of the spleen. Adrenals/Urinary Tract: There is symmetric enhancement and excretion of the bilateral kidneys. Note is made of a macroscopic fat containing approximately 2.3 cm angiomyolipoma involving from the inferior medial aspect the left kidney (image 37, series 2). Renal cysts are seen bilaterally with dominant right-sided renal cyst measuring 3.8 cm in diameter (image 15, series 2 and index left renal cyst measuring 3.9 cm (image 31, series 2). Additional subcentimeter hypoattenuating renal lesions are too small to accurately characterize though favored to represent additional renal cysts. No urinary obstruction or perinephric stranding. Normal appearance the bilateral adrenal glands. Normal appearance of the urinary bladder given degree distention. Stomach/Bowel: Extensive diverticulosis involving the majority of the descending and sigmoid colon. There is wall thickening and ill-defined stranding about the sigmoid colon within the left lower abdomen/pelvis (representative image 67, series 2, progressed compared to the 07/2019 examination and worrisome for an area of acute diverticulitis. There is a small amount of fluid is seen within the pelvic cul-de-sac without evidence of perforation or definable/drainable fluid collection. No evidence of perforation or definable/drainable fluid collection. Small hiatal hernia. No evidence of enteric obstruction. Normal appearance of the terminal ileum and the retrocecal appendix. No pneumoperitoneum, pneumatosis or portal venous gas. Vascular/Lymphatic: Scattered atherosclerotic plaque within normal caliber abdominal aorta. Scattered lymph nodes centered within the left hemipelvis are presumably reactive in etiology with index left common iliac chain lymph node measuring 1.1 cm in greatest short axis diameter image 54, series 2 and index left pelvic sidewall lymph node measuring 1.1 cm (image 64,  series 2). Otherwise, no bulky retroperitoneal, mesenteric, pelvic or inguinal lymphadenopathy. Reproductive: Post hysterectomy.  No discrete adnexal lesion. Other: Minimal amount of subcutaneous edema about the midline of the low back. Musculoskeletal: No acute or aggressive osseous abnormalities. Moderate DDD of L5-S1 with disc space height loss, endplate irregularity and sclerosis. Mild-to-moderate degenerative change the bilateral hips, right greater than left, with joint space loss, subchondral sclerosis and osteophytosis. IMPRESSION: 1. Findings worrisome for acute uncomplicated diverticulitis involving the sigmoid colon within the left lower abdomen/pelvis. No evidence of perforation or definable/drainable fluid collection. If not recently performed, further evaluation with colonoscopy after the resolution of acute symptoms is advised to exclude the presence of an underlying mass. 2. 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 3. Small hiatal hernia. 4. Bilateral renal cysts. 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 5.  Aortic Atherosclerosis (ICD10-I70.0). Electronically Signed   By: Sandi Mariscal M.D.   On: 03/14/2020 08:51    Procedures Procedures (including critical care time)  Medications Ordered in ED Medications  heparin ADULT infusion 100 units/mL (25000 units/225mL sodium chloride 0.45%) (1,350 Units/hr Intravenous New Bag/Given 03/14/20 1043)  furosemide (LASIX) tablet 40 mg (has no  administration in time range)  isosorbide mononitrate (IMDUR) 24 hr tablet 30 mg (has no administration in time range)  sacubitril-valsartan (ENTRESTO) 49-51 mg per tablet (has no administration in time range)  spironolactone (ALDACTONE) tablet 50 mg (has no administration in time range)  HYDROcodone-acetaminophen (NORCO/VICODIN) 5-325 MG per tablet 1-2 tablet (has no administration in time range)  morphine 2 MG/ML injection 2 mg (has no administration in time range)  ondansetron (ZOFRAN) tablet 4 mg (has no administration in time range)    Or  ondansetron (ZOFRAN) injection 4 mg (has no administration in time range)  piperacillin-tazobactam (ZOSYN) IVPB 3.375 g (has no administration in time range)  piperacillin-tazobactam (ZOSYN) IVPB 3.375 g (has no administration in time range)  sodium chloride 0.9 % bolus 250 mL (0 mLs Intravenous Stopped 03/14/20 1047)  ondansetron (ZOFRAN) injection 4 mg (4 mg Intravenous Given 03/14/20 0724)  fentaNYL (SUBLIMAZE) injection 50 mcg (50 mcg Intravenous Given 03/14/20 0725)  iohexol (OMNIPAQUE) 300 MG/ML solution 80 mL (80 mLs Intravenous Contrast Given 03/14/20 0747)  sodium chloride (PF) 0.9 % injection (  Contrast Given 03/14/20 0744)  piperacillin-tazobactam (ZOSYN) IVPB 3.375 g (0 g Intravenous Stopped 03/14/20 1047)  heparin bolus via infusion 2,500 Units (2,500 Units Intravenous Bolus from Bag 03/14/20 1043)    ED Course  I have reviewed the triage vital signs and the nursing notes.  Pertinent labs & imaging results that were available during my care of the patient were reviewed by me and considered in my medical decision making (see chart for details).    MDM Rules/Calculators/A&P                         74 year old female presents with 5 days of abdominal pain, chills, did have a low-grade temp upon arrival initially at [facility] where she had lab work and urinalysis done but left due to long wait times and came here.  Reports associated  vomiting and  some diarrhea, no blood in the stool.  Has also had some urinary frequency.  I have reviewed and interpreted lab work that was completed at [facility]: CBC: very slight leukocytosis of 10.7, normal hemoglobin CMP: Creatinine at baseline at 1.34, CO2 of 21, glucose of 124 but no other significant electrolyte derangements, normal LFTs Lipase: WNL UA: Cloudy with positive nitrites, leukocytes present, greater than 50 WBCs and many bacteria, consistent with infection.   Reviewed urine cultures from patient's prior UTI in April, which grew E. coli that was resistant to Rocephin, cefazolin, ampicillin and Unasyn  While patient does have a UTI, given her more diffuse abdominal pain will get CT to look for other contributing intra-abdominal pathology.  IV fluids, pain and nausea medicine given.  CT with findings consistent with acute uncomplicated diverticulitis of the sigmoid colon with no evidence of perforation or abscess.  CT also shows occlusion of the right portal vein with associated cavernous transformation and slight atrophy of the posterior segment of the right lobe of the liver, this is a new change compared to CTs in April 2021, unclear etiology for occlusion noted on the scan.  Given patient has both UTI and and acute diverticulitis, I discussed with pharmacy and they recommend using Zosyn for antibiotic coverage until culture results for urine return, after which therapy may be able to be narrowed.  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  Heparin ordered per pharmacy consult.  Medicine admission consult placed.  Case discussed with Dr. Cherylann Ratel with [facility] who will see and admit the patient.  Final Clinical Impression(s) / ED Diagnoses Final diagnoses:  Acute diverticulitis  Acute UTI  Portal  vein thrombosis    Rx / DC Orders ED Discharge Orders    None       Janet Berlin 03/14/20 1342    Ezequiel Essex, MD 03/14/20 2104

## 2020-03-14 NOTE — ED Notes (Signed)
Called lab to ask if we could use urine from [place] for urine culture, stated we need new urine, informed patient and patient will try after she gets relief from medication for pain

## 2020-03-14 NOTE — Progress Notes (Signed)
ANTICOAGULATION CONSULT NOTE - Initial Consult  Pharmacy Consult for IV heparin Indication: portal vein occlusion  Allergies  Allergen Reactions  . Ibuprofen Nausea Only and Other (See Comments)    Patient Measurements: Height: 5\' 10"  (177.8 cm) Weight: 95.3 kg (210 lb) IBW/kg (Calculated) : 68.5 Heparin Dosing Weight: 88  Vital Signs: Temp: 99 F (37.2 C) (12/12 0558) Temp Source: Oral (12/12 0558) BP: 141/63 (12/12 0930) Pulse Rate: 72 (12/12 0930)  Labs: Recent Labs    03/13/20 2218  HGB 12.9  HCT 43.2  PLT 232  CREATININE 1.34*    Estimated Creatinine Clearance: 44.7 mL/min (A) (by C-G formula based on SCr of 1.34 mg/dL (H)).   Medical History: Past Medical History:  Diagnosis Date  . AICD (automatic cardioverter/defibrillator) present 10/08/2014   SUBQ    /    DR [name]  . CHF (congestive heart failure) ([place])   . Diabetes mellitus without complication ([place])   . History of cardiac cath 05/2007   normal-with patent coronaries  . History of colonoscopy   . History of hiatal hernia   . Hypertension   . Iatrogenic thyroiditis   . Non-ischemic cardiomyopathy ([place])    EF 28%- reassessment of LV function 2011 with LVEf 45-50%  . PAD (peripheral artery disease) ([facility])    lower extremities with ABIs of 0.5 bilaterally  . Personal history of goiter   . S/P radioactive iodine thyroid ablation   . S/P thyroidectomy   . Shortness of breath dyspnea     Medications:  Scheduled:   Infusions:  . piperacillin-tazobactam      Assessment: 74 yo female presented with diverticulitis and also urinary symptoms suggestive of UTI also found to have a portal vein occlusion to start IV heparin per pharmacy for new clot. Baseline labs have been drawn. Patient not on any anticoagulation meds prior to admission.   Goal of Therapy:  Heparin level 0.3-0.7 units/ml Monitor platelets by anticoagulation protocol: Yes   Plan:   IV heparin bolus of 2500 units then  IV heparin  rate of 1350 units/hr  Check heparin level 8 hours after heparin started  Daily CBC  Kara Mead 03/14/2020,10:14 AM

## 2020-03-15 ENCOUNTER — Encounter (HOSPITAL_COMMUNITY): Payer: Self-pay | Admitting: Internal Medicine

## 2020-03-15 DIAGNOSIS — I81 Portal vein thrombosis: Secondary | ICD-10-CM

## 2020-03-15 DIAGNOSIS — K5732 Diverticulitis of large intestine without perforation or abscess without bleeding: Secondary | ICD-10-CM

## 2020-03-15 LAB — CBC
HCT: 37.8 % (ref 36.0–46.0)
Hemoglobin: 10.8 g/dL — ABNORMAL LOW (ref 12.0–15.0)
MCH: 28.9 pg (ref 26.0–34.0)
MCHC: 28.6 g/dL — ABNORMAL LOW (ref 30.0–36.0)
MCV: 101.1 fL — ABNORMAL HIGH (ref 80.0–100.0)
Platelets: 197 10*3/uL (ref 150–400)
RBC: 3.74 MIL/uL — ABNORMAL LOW (ref 3.87–5.11)
RDW: 14.2 % (ref 11.5–15.5)
WBC: 8.9 10*3/uL (ref 4.0–10.5)
nRBC: 0 % (ref 0.0–0.2)

## 2020-03-15 LAB — COMPREHENSIVE METABOLIC PANEL
ALT: 11 U/L (ref 0–44)
AST: 11 U/L — ABNORMAL LOW (ref 15–41)
Albumin: 2.8 g/dL — ABNORMAL LOW (ref 3.5–5.0)
Alkaline Phosphatase: 80 U/L (ref 38–126)
Anion gap: 7 (ref 5–15)
BUN: 11 mg/dL (ref 8–23)
CO2: 24 mmol/L (ref 22–32)
Calcium: 8.5 mg/dL — ABNORMAL LOW (ref 8.9–10.3)
Chloride: 107 mmol/L (ref 98–111)
Creatinine, Ser: 1.51 mg/dL — ABNORMAL HIGH (ref 0.44–1.00)
GFR, Estimated: 36 mL/min — ABNORMAL LOW (ref >60)
Glucose, Bld: 126 mg/dL — ABNORMAL HIGH (ref 70–99)
Potassium: 3.3 mmol/L — ABNORMAL LOW (ref 3.5–5.1)
Sodium: 138 mmol/L (ref 135–145)
Total Bilirubin: 0.5 mg/dL (ref 0.3–1.2)
Total Protein: 6.4 g/dL — ABNORMAL LOW (ref 6.5–8.1)

## 2020-03-15 LAB — HEPARIN LEVEL (UNFRACTIONATED)
Heparin Unfractionated: 0.8 IU/mL — ABNORMAL HIGH (ref 0.30–0.70)
Heparin Unfractionated: 0.34 IU/mL (ref 0.30–0.70)
Heparin Unfractionated: 0.15 IU/mL — ABNORMAL LOW (ref 0.30–0.70)

## 2020-03-15 LAB — GLUCOSE, CAPILLARY
Glucose-Capillary: 108 mg/dL — ABNORMAL HIGH (ref 70–99)
Glucose-Capillary: 115 mg/dL — ABNORMAL HIGH (ref 70–99)
Glucose-Capillary: 103 mg/dL — ABNORMAL HIGH (ref 70–99)
Glucose-Capillary: 128 mg/dL — ABNORMAL HIGH (ref 70–99)

## 2020-03-15 MED ORDER — HEPARIN (PORCINE) 25000 UT/250ML-% IV SOLN
1250.0000 [IU]/h | INTRAVENOUS | Status: DC
  Filled 2020-03-15: qty 250

## 2020-03-15 MED ORDER — ACETAMINOPHEN 325 MG PO TABS
650.0000 mg | ORAL_TABLET | ORAL | Status: DC | PRN
  Administered 2020-03-16: 650 mg via ORAL
  Filled 2020-03-15: qty 2

## 2020-03-15 MED ORDER — HEPARIN (PORCINE) 25000 UT/250ML-% IV SOLN
1550.0000 [IU]/h | INTRAVENOUS | Status: DC
  Administered 2020-03-15: 14:00:00 1300 [IU]/h via INTRAVENOUS
  Filled 2020-03-15 (×2): qty 250

## 2020-03-15 NOTE — Progress Notes (Signed)
ANTICOAGULATION CONSULT NOTE - Follow Up Consult  Pharmacy Consult for Heparin Indication: portal vein occlusion  Allergies  Allergen Reactions  . Ibuprofen Nausea Only and Other (See Comments)    Patient Measurements: Height: 5\' 10"  (177.8 cm) Weight: 95.1 kg (209 lb 11.2 oz) IBW/kg (Calculated) : 68.5 Heparin Dosing Weight: 88 kg  Vital Signs: Temp: 97.8 F (36.6 C) (12/13 0425) Temp Source: Oral (12/13 0425) BP: 133/73 (12/13 0750) Pulse Rate: 55 (12/13 0750)  Labs: Recent Labs    03/13/20 2218 03/14/20 1658 03/15/20 0058 03/15/20 1201  HGB 12.9  --  10.8*  --   HCT 43.2  --  37.8  --   PLT 232  --  197  --   HEPARINUNFRC  --  <0.10* 0.80* 0.34  CREATININE 1.34*  --  1.51*  --     Estimated Creatinine Clearance: 39.6 mL/min (A) (by C-G formula based on SCr of 1.51 mg/dL (H)).   Medications:  Scheduled:  . aspirin EC  81 mg Oral Daily  . atorvastatin  40 mg Oral q1800  . carvedilol  25 mg Oral BID WC  . furosemide  40 mg Oral BID  . insulin aspart  0-15 Units Subcutaneous TID WC  . insulin aspart  0-5 Units Subcutaneous QHS  . isosorbide mononitrate  30 mg Oral Daily  . levothyroxine  125 mcg Oral Q0600  . sacubitril-valsartan  1 tablet Oral BID  . spironolactone  50 mg Oral Daily   Infusions:  . heparin 1,250 Units/hr (03/15/20 0454)  . piperacillin-tazobactam (ZOSYN)  IV 3.375 g (03/15/20 0524)   PRN: HYDROcodone-acetaminophen, morphine injection, ondansetron **OR** ondansetron (ZOFRAN) IV  Assessment: 74 yo female presented with diverticulitis and also urinary symptoms suggestive of UTI also found to have a portal vein occlusion to start IV heparin per pharmacy for new clot. Pharmacy has been consulted for IV heparin.  03/15/2020   Heparin level 0.34, at low end of therapeutic range on 1250 units/hr  CBC: Hgb down 10.8, Plts wnl  SCr increased 1.51  No bleeding or complications reported  Goal of Therapy:  Heparin level 0.3-0.7  units/ml Monitor platelets by anticoagulation protocol: Yes   Plan:   Increase heparin drip slightly 1300 units/hr  Recheck heparin level in 8hrs  Monitor closely for signs/symptoms of bleeding  Follow up plan for long-term anticoagulation  Peggyann Juba, PharmD, BCPS Pharmacy: [phone] 03/15/2020,12:56 PM

## 2020-03-15 NOTE — Assessment & Plan Note (Signed)
Continue statin. 

## 2020-03-15 NOTE — Assessment & Plan Note (Signed)
-  No prior history or episodes previously -Continue on Zosyn for now -Continue supportive care and antiemetics -Caution with fluids in setting of underlying CHF

## 2020-03-15 NOTE — Hospital Course (Signed)
Paula Stein is a 74 yo female with PMH hyperthyroidism (treated with surgery and RAI, now on replacement hormone), DMII, sCHF, HTN who presented with LLQ abdominal pain. She had also reported that she had an episode of stool incontinence when initially having her spell of abdominal pain. This was new for her and she had no prior episodes of similar.  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  She was started on Zosyn and a heparin drip. Oncology was consulted; labs were drawn for further hypercoag workup.

## 2020-03-15 NOTE — Progress Notes (Signed)
ANTICOAGULATION CONSULT NOTE - Follow Up Consult  Pharmacy Consult for Heparin Indication: portal vein occlusion  Allergies  Allergen Reactions  . Ibuprofen Nausea Only and Other (See Comments)    Patient Measurements: Height: 5\' 10"  (177.8 cm) Weight: 95.1 kg (209 lb 11.2 oz) IBW/kg (Calculated) : 68.5 Heparin Dosing Weight: 88 kg  Vital Signs: Temp: 98.2 F (36.8 C) (12/13 2123) Temp Source: Oral (12/13 2123) BP: 124/52 (12/13 2123) Pulse Rate: 65 (12/13 2123)  Labs: Recent Labs    03/13/20 2218 03/14/20 1658 03/15/20 0058 03/15/20 1201 03/15/20 2203  HGB 12.9  --  10.8*  --   --   HCT 43.2  --  37.8  --   --   PLT 232  --  197  --   --   HEPARINUNFRC  --    < > 0.80* 0.34 0.15*  CREATININE 1.34*  --  1.51*  --   --    < > = values in this interval not displayed.    Estimated Creatinine Clearance: 39.6 mL/min (A) (by C-G formula based on SCr of 1.51 mg/dL (H)).   Medications:  Scheduled:  . aspirin EC  81 mg Oral Daily  . atorvastatin  40 mg Oral q1800  . carvedilol  25 mg Oral BID WC  . furosemide  40 mg Oral BID  . insulin aspart  0-15 Units Subcutaneous TID WC  . insulin aspart  0-5 Units Subcutaneous QHS  . isosorbide mononitrate  30 mg Oral Daily  . levothyroxine  125 mcg Oral Q0600  . sacubitril-valsartan  1 tablet Oral BID  . spironolactone  50 mg Oral Daily   Infusions:  . heparin 1,300 Units/hr (03/15/20 1340)  . piperacillin-tazobactam (ZOSYN)  IV 3.375 g (03/15/20 2137)   PRN: acetaminophen, morphine injection, ondansetron **OR** ondansetron (ZOFRAN) IV  Assessment: 74 yo female presented with diverticulitis and also urinary symptoms suggestive of UTI also found to have a portal vein occlusion to start IV heparin per pharmacy for new clot. Pharmacy has been consulted for IV heparin.  03/15/2020   CBC: Hgb down 10.8, Plts wnl  SCr increased 1.51  No bleeding or complications reported  96:75 HL 0.15, subtherapeutic on 1300  units/hr  Per nightshift RN, heparin had been off around shift change due to line issues--unsure how long heparin was off for  No bleeding noted  Goal of Therapy:  Heparin level 0.3-0.7 units/ml Monitor platelets by anticoagulation protocol: Yes   Plan:   Increase heparin drip to 1550 units/hr  Recheck heparin level in 8hrs  Monitor closely for signs/symptoms of bleeding  Follow up plan for long-term anticoagulation  Dolly Rias RPh 03/15/2020, 11:28 PM

## 2020-03-15 NOTE — Progress Notes (Signed)
[name] for IV heparin Indication: portal vein occlusion  Allergies  Allergen Reactions  . Ibuprofen Nausea Only and Other (See Comments)   Patient Measurements: Height: 5\' 10"  (177.8 cm) Weight: 95.3 kg (210 lb) IBW/kg (Calculated) : 68.5 Heparin Dosing Weight: 88  Vital Signs: Temp: 97.8 F (36.6 C) (12/12 2130) Temp Source: Oral (12/13 0007) BP: 122/59 (12/13 0048) Pulse Rate: 59 (12/13 0048)  Labs: Recent Labs    12/Paula/21 2218 03/14/20 1658 03/15/20 0058  HGB 12.9  --  10.8*  HCT 43.2  --  37.8  PLT 232  --  197  HEPARINUNFRC  --  <0.10* 0.80*  CREATININE 1.34*  --  1.51*    Estimated Creatinine Clearance: 39.6 mL/min (A) (by C-G formula based on SCr of 1.51 mg/dL (H)).   Medical History: Past Medical History:  Diagnosis Date  . AICD (automatic cardioverter/defibrillator) present 10/08/2014   SUBQ    /    DR [name]  . CHF (congestive heart failure) ([facility])   . Diabetes mellitus without complication ([facility])   . History of cardiac cath 05/2007   normal-with patent coronaries  . History of colonoscopy   . History of hiatal hernia   . Hypertension   . Iatrogenic thyroiditis   . Non-ischemic cardiomyopathy ([place])    EF 28%- reassessment of LV function 2011 with LVEf 45-50%  . PAD (peripheral artery disease) ([facility])    lower extremities with ABIs of 0.5 bilaterally  . Personal history of goiter   . S/P radioactive iodine thyroid ablation   . S/P thyroidectomy   . Shortness of breath dyspnea     Medications:  Scheduled:  . aspirin EC  81 mg Oral Daily  . atorvastatin  40 mg Oral q1800  . carvedilol  25 mg Oral BID WC  . furosemide  40 mg Oral BID  . insulin aspart  0-15 Units Subcutaneous TID WC  . insulin aspart  0-5 Units Subcutaneous QHS  . isosorbide mononitrate  30 mg Oral Daily  . levothyroxine  125 mcg Oral Q0600  . sacubitril-valsartan  1 tablet Oral BID  . spironolactone  50 mg Oral Daily   Infusions:  .  heparin 1,350 Units/hr (03/15/20 0345)  . piperacillin-tazobactam (ZOSYN)  IV 3.375 g (03/14/20 2311)   Assessment: 74 yo Stein presented with diverticulitis and also urinary symptoms suggestive of UTI also found to have a portal vein occlusion to start IV heparin per pharmacy for new clot. Baseline labs have been drawn. Patient not on any anticoagulation meds prior to admission.   Begin IV heparin, bolus of 2500 units and rate of 1350 units/hr  Heparin start delayed 12/12 - first level < 0.1, bolus given, infusion started  Goal of Therapy:  Heparin level 0.3-0.7 units/ml Monitor platelets by anticoagulation protocol: Yes   Today, 03/15/2020 2400 Hep level = 0.8 units/ml, slightly above therapeutic range   Plan:   Reduce Heparin infusion to 1250 units/hr  Check heparin level 8 hours after rate change  Daily CBC  [name], Rona Ravens PharmD 03/15/2020,3:54 AM

## 2020-03-15 NOTE — Assessment & Plan Note (Signed)
-  If BP tolerates, continue aspirin, Lipitor, Coreg, Lasix, Imdur, Entresto, Aldactone (see hypotension)

## 2020-03-15 NOTE — Assessment & Plan Note (Signed)
-   still unclear etiology; no personal history of CA nor family history; sounds like history of Grave's and other medical co-morbidities; no prior history of clots either nor in family - continue heparin drip - appreciate heme/onc consult - follow up pending hypercoag labs

## 2020-03-15 NOTE — Assessment & Plan Note (Signed)
-  She states blood pressure is typically elevated at home, 182X to 937J systolically -Currently BP is low normal this morning -will add parameters for current meds ordered

## 2020-03-15 NOTE — Consult Note (Addendum)
[facility]  [phone] [phone]  ID: DANISSA RUNDLE OB: [birth year] MR#: [id number] OEU#:[id number] PCP: [name], MD  CHIEF COMPLAINT: Portal vein thrombosis  INTERVAL HISTORY: Ms. Ternes is a 74 year old female from [place], [place]. She has a past medical history significant for diabetes mellitus type 2, hypertension, hypothyroidism, CKD, nonischemic cardiac myopathy s/p ICD implantation.  Patient presented to the hospital with a 4-day history of abdominal pain.  Pain was sharp and in her lower abdomen.  It caused her to lose control of her bowels initially and then was associated with nausea and vomiting.  CT abdomen/pelvis showed portal vein thrombosis diverticulitis.  Additionally, UA was concerning for UTI.  She has been started on a heparin drip. Hematology was asked to see the patient to make recommendations regarding her PVT.   REVIEW OF SYSTEMS: She has had no clotting problems in the past but also has had no significant surgeries.  The patient reports that her abdominal pain has improved since admission.  Prior to admission, she rated her pain 10/10 but now rates it 7/10.  She denies nausea vomiting.  Denies diarrhea.  Has not noticed any melena or hematochezia.  She was able to eat carb modified diet earlier today.  The remainder of the review of systems noncontributory.  COVID 19 VACCINATION STATUS: Status post Moderna x2 with booster October 2021with booster OCT 2021  PAST MEDICAL HISTORY: Past Medical History:  Diagnosis Date   AICD (automatic cardioverter/defibrillator) present 10/08/2014   SUBQ    /    DR [name]   CHF (congestive heart failure) ([facility])    Diabetes mellitus without complication ([facility])    History of cardiac cath 05/2007   normal-with patent coronaries   History of colonoscopy    History of hiatal hernia    Hypertension    Iatrogenic thyroiditis    Non-ischemic cardiomyopathy ([place])    EF 28%- reassessment of LV function  2011 with LVEf 45-50%   PAD (peripheral artery disease) ([facility])    lower extremities with ABIs of 0.5 bilaterally   Personal history of goiter    S/P radioactive iodine thyroid ablation    S/P thyroidectomy    Shortness of breath dyspnea    PAST SURGICAL HISTORY: Past Surgical History:  Procedure Laterality Date   EP IMPLANTABLE DEVICE N/A 10/08/2014   Procedure: SubQ ICD Implant;  Surgeon: [name], MD;  Location: [facility];  Service: Cardiovascular;  Laterality: N/A;   SUBQ ICD CHANGEOUT N/A 11/14/2019   Procedure: SUBQ ICD CHANGEOUT;  Surgeon: [name], MD;  Location: [facility];  Service: Cardiovascular;  Laterality: N/A;   THYROIDECTOMY     TONSILLECTOMY     TOTAL ABDOMINAL HYSTERECTOMY     FAMILY HISTORY The patient's father died at the age of 74 after hit-and-run accident.  The patient's mother died at the age of 92 from complications of diabetes.  The patient reports that she had a maternal aunt who was on Coumadin.  She thinks that she was on Coumadin for a blood clot.  Denies any other new or family history of blood clots.  The patient is an only child  Family History  Problem Relation Age of Onset   Diabetes type II Mother    Hypertension Mother    Diabetes Mother    Heart disease Mother    Other Father        deceased from accident age 38  Hyperlipidemia Father    Breast cancer Neg Hx    Stomach cancer Neg Hx    Colon cancer Neg Hx    Esophageal cancer Neg Hx    Pancreatic cancer Neg Hx    GYN HX: GX P1, normal pregnancy with no clotting or bleeding complications  SOCIAL HISTORY: The patient is divorced.  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 She lives in [place] son who she lives with her her (only) son Layni Kreamer, as well as his wife [name] and their 41 year old daughter.  Drinks alcohol only rarely, about once a month.  Reports that she is a "light smoker" and smokes about 1/4 pack of  cigarettes per day.   Ports that she is independent with ADLs.  ADVANCED DIRECTIVES: Has not completed a living will or healthcare power of attorney but reports that her son, Marcellina Jonsson, should make decisions for her if she cannot.  HEALTH MAINTENANCE: Social History   Tobacco Use   Smoking status: Light Tobacco Smoker    Packs/day: 0.25    Years: 40.00    Pack years: 10.00    Types: Cigarettes   Smokeless tobacco: Never Used   Tobacco comment: on and off   Vaping Use   Vaping Use: Never used  Substance Use Topics   Alcohol use: Yes    Alcohol/week: 0.0 standard drinks    Comment: rare glass of wine   Drug use: No   Colonoscopy: Last colonoscopy about 10 years ago PAP: Bone density: 01/01/2020, osteopenia Lipid panel:  Allergies  Allergen Reactions   Ibuprofen Nausea Only and Other (See Comments)   Current Facility-Administered Medications  Medication Dose Route Frequency Provider Last Rate Last Admin   aspirin EC tablet 81 mg  81 mg Oral Daily Kyle, Tyrone A, DO       atorvastatin (LIPITOR) tablet 40 mg  40 mg Oral q1800 Kyle, Tyrone A, DO   40 mg at 03/14/20 1810   carvedilol (COREG) tablet 25 mg  25 mg Oral BID WC Dwyane Dee, MD   25 mg at 03/14/20 1712   furosemide (LASIX) tablet 40 mg  40 mg Oral BID Marylyn Ishihara, Tyrone A, DO   40 mg at 03/14/20 2244   heparin ADULT infusion 100 units/mL (25000 units/247mL sodium chloride 0.45%)  1,250 Units/hr Intravenous Continuous Minda Ditto, RPH 12.5 mL/hr at 03/15/20 0454 1,250 Units/hr at 03/15/20 0454   HYDROcodone-acetaminophen (NORCO/VICODIN) 5-325 MG per tablet 1-2 tablet  1-2 tablet Oral Q4H PRN Marylyn Ishihara, Tyrone A, DO       insulin aspart (novoLOG) injection 0-15 Units  0-15 Units Subcutaneous TID WC Kyle, Tyrone A, DO   2 Units at 03/14/20 1811   insulin aspart (novoLOG) injection 0-5 Units  0-5 Units Subcutaneous QHS Kyle, Tyrone A, DO       isosorbide mononitrate (IMDUR) 24 hr tablet 30 mg  30 mg Oral Daily Kyle, Tyrone A,  DO       levothyroxine (SYNTHROID) tablet 125 mcg  125 mcg Oral Q0600 Marylyn Ishihara, Tyrone A, DO   125 mcg at 03/15/20 0519   morphine 2 MG/ML injection 2 mg  2 mg Intravenous Q4H PRN Marylyn Ishihara, Tyrone A, DO   2 mg at 03/15/20 0519   ondansetron (ZOFRAN) tablet 4 mg  4 mg Oral Q6H PRN Marylyn Ishihara, Tyrone A, DO       Or   ondansetron (ZOFRAN) injection 4 mg  4 mg Intravenous Q6H PRN Marylyn Ishihara,  Tyrone A, DO       piperacillin-tazobactam (ZOSYN) IVPB 3.375 g  3.375 g Intravenous Q8H Wofford, Drew A, RPH 12.5 mL/hr at 03/15/20 0524 3.375 g at 03/15/20 0524   sacubitril-valsartan (ENTRESTO) 49-51 mg per tablet  1 tablet Oral BID Cherylann Ratel A, DO   1 tablet at 03/14/20 2244   spironolactone (ALDACTONE) tablet 50 mg  50 mg Oral Daily Kyle, Tyrone A, DO       OBJECTIVE: Well-appearing female examined in recliner in hospital room Vitals:   03/15/20 0425 03/15/20 0750  BP: 129/65 133/73  Pulse: (!) 57 (!) 55  Resp: 20   Temp: 97.8 F (36.6 C)   SpO2: 98%    Body mass index is 30.09 kg/m. ECOG FS:1 - Symptomatic but completely ambulatory Ocular: Sclerae unicteric, pupils equal, round and reactive to light Ear-nose-throat: Oropharynx clear, dentition fair Lymphatic: No cervical or supraclavicular adenopathy Lungs no rales or rhonchi, good excursion bilaterally Heart regular rate and rhythm, no murmur appreciated Abd positive bowel sounds, soft tenderness to palpation in the lower abdomen MSK no focal spinal tenderness, no joint edema Neuro: non-focal, well-oriented, appropriate affect  LAB RESULTS: CMP     Component Value Date/Time   NA 138 03/15/2020 0058   NA 143 06/13/2019 1428   K 3.3 (L) 03/15/2020 0058   CL 107 03/15/2020 0058   CO2 24 03/15/2020 0058   GLUCOSE 126 (H) 03/15/2020 0058   BUN 11 03/15/2020 0058   BUN 17 06/13/2019 1428   CREATININE 1.51 (H) 03/15/2020 0058   CREATININE 0.99 (H) 09/26/2017 0000   CALCIUM 8.5 (L) 03/15/2020 0058   PROT 6.4 (L) 03/15/2020 0058   ALBUMIN 2.8 (L)  03/15/2020 0058   AST 11 (L) 03/15/2020 0058   ALT 11 03/15/2020 0058   ALKPHOS 80 03/15/2020 0058   BILITOT 0.5 03/15/2020 0058   GFRNONAA 36 (L) 03/15/2020 0058   GFRAA 40 (L) 11/12/2019 0809   INo results found for: SPEP, UPEP Lab Results  Component Value Date   WBC 8.9 03/15/2020   NEUTROABS 2.4 11/12/2019   HGB 10.8 (L) 03/15/2020   HCT 37.8 03/15/2020   MCV 101.1 (H) 03/15/2020   PLT 197 03/15/2020   '@LASTCHEMISTRY'$ @ No results found for: LABCA2 No components found for: [id number] No results for input(s): INR in the last 168 hours. Urinalysis    Component Value Date/Time   COLORURINE AMBER (A) 03/13/2020 2212   APPEARANCEUR CLOUDY (A) 03/13/2020 2212   LABSPEC 1.024 03/13/2020 2212   PHURINE 5.0 03/13/2020 2212   GLUCOSEU NEGATIVE 03/13/2020 2212   HGBUR NEGATIVE 03/13/2020 2212   BILIRUBINUR NEGATIVE 03/13/2020 2212   BILIRUBINUR neg 07/14/2019 0936   KETONESUR NEGATIVE 03/13/2020 2212   PROTEINUR >=300 (A) 03/13/2020 2212   UROBILINOGEN 0.2 07/14/2019 0936   UROBILINOGEN 0.2 01/28/2007 1647   NITRITE POSITIVE (A) 03/13/2020 2212   LEUKOCYTESUR TRACE (A) 03/13/2020 2212   STUDIES: CT ABDOMEN PELVIS W CONTRAST  Result Date: 03/14/2020 CLINICAL DATA:  Acute non localized abdominal pain for the past 4 days. EXAM: CT ABDOMEN AND PELVIS WITH CONTRAST TECHNIQUE: Multidetector CT imaging of the abdomen and pelvis was performed using the standard protocol following bolus administration of intravenous contrast. CONTRAST:  45mL OMNIPAQUE IOHEXOL 300 MG/ML  SOLN COMPARISON:  07/25/2019; chest radiograph-07/04/2019 FINDINGS: Lower chest: Limited visualization of the lower thorax demonstrates interstitial opacities within the imaged lung bases with associated minimal bronchiectasis but without evidence of honeycombing. Cardiomegaly. Trace amount of pericardial fluid, presumably physiologic. A  defibrillator pack is imbedded within the soft tissues of the left lateral chest.  Hepatobiliary: Normal hepatic contour. Redemonstrated multiple hypoattenuating nonenhancing hepatic cysts, the largest of which within the medial aspect of the lateral segment of the left lobe of the liver measuring approximately 3.8 cm in diameter (image 25, series 2). No discrete worrisome hepatic lesions. There is occlusion of the right portal vein (representative axial image 21, series 2; coronal image 74, series 5) with associated cavernous transformation and slight atrophy of the posterior segment of the right lobe of the liver in comparison to the left, new compared to the 07/05/2019 examination. The etiology of this apparent occlusion is not depicted on this examination. No ascites. Normal appearance of the gallbladder given degree distention. Pancreas: Normal appearance of the pancreas. Spleen: Normal appearance of the spleen. Adrenals/Urinary Tract: There is symmetric enhancement and excretion of the bilateral kidneys. Note is made of a macroscopic fat containing approximately 2.3 cm angiomyolipoma involving from the inferior medial aspect the left kidney (image 37, series 2). Renal cysts are seen bilaterally with dominant right-sided renal cyst measuring 3.8 cm in diameter (image 15, series 2 and index left renal cyst measuring 3.9 cm (image 31, series 2). Additional subcentimeter hypoattenuating renal lesions are too small to accurately characterize though favored to represent additional renal cysts. No urinary obstruction or perinephric stranding. Normal appearance the bilateral adrenal glands. Normal appearance of the urinary bladder given degree distention. Stomach/Bowel: Extensive diverticulosis involving the majority of the descending and sigmoid colon. There is wall thickening and ill-defined stranding about the sigmoid colon within the left lower abdomen/pelvis (representative image 67, series 2, progressed compared to the 07/2019 examination and worrisome for an area of acute diverticulitis.  There is a small amount of fluid is seen within the pelvic cul-de-sac without evidence of perforation or definable/drainable fluid collection. No evidence of perforation or definable/drainable fluid collection. Small hiatal hernia. No evidence of enteric obstruction. Normal appearance of the terminal ileum and the retrocecal appendix. No pneumoperitoneum, pneumatosis or portal venous gas. Vascular/Lymphatic: Scattered atherosclerotic plaque within normal caliber abdominal aorta. Scattered lymph nodes centered within the left hemipelvis are presumably reactive in etiology with index left common iliac chain lymph node measuring 1.1 cm in greatest short axis diameter image 54, series 2 and index left pelvic sidewall lymph node measuring 1.1 cm (image 64, series 2). Otherwise, no bulky retroperitoneal, mesenteric, pelvic or inguinal lymphadenopathy. Reproductive: Post hysterectomy.  No discrete adnexal lesion. Other: Minimal amount of subcutaneous edema about the midline of the low back. Musculoskeletal: No acute or aggressive osseous abnormalities. Moderate DDD of L5-S1 with disc space height loss, endplate irregularity and sclerosis. Mild-to-moderate degenerative change the bilateral hips, right greater than left, with joint space loss, subchondral sclerosis and osteophytosis. IMPRESSION: 1. Findings worrisome for acute uncomplicated diverticulitis involving the sigmoid colon within the left lower abdomen/pelvis. No evidence of perforation or definable/drainable fluid collection. If not recently performed, further evaluation with colonoscopy after the resolution of acute symptoms is advised to exclude the presence of an underlying mass. 2. Occlusion of the right portal vein with associated cavernous transformation and slight atrophy of the posterior segment of the right lobe of the liver, new compared to the 07/05/2019 examination - the etiology of this apparent occlusion is not depicted on this examination and while  a discrete underlying lesion is not identified, further evaluation with nonemergent dedicated cirrhotic protocol abdominal CT may be performed as indicated (note, abdominal MRI is likely unable to be performed secondary  to presence of a defibrillator). 3. Small hiatal hernia. 4. Bilateral renal cysts. 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 5.  Aortic Atherosclerosis (ICD10-I70.0). Electronically Signed   By: Sandi Mariscal M.D.   On: 03/14/2020 08:51   ASSESSMENT: 74 y.o. female from [place], [place] with incidentally found portal vein thrombosis and course of work-up for diverticulosis.   (1) Jak 2, prothrombin gene mutation and factor V Leiden mutation probes sent 03/15/2020  (2) on intravenous heparin, to be switched to oral anticoagulant at the time of diagnosis.   PLAN: Ms. Cataldo was admitted with abdominal pain and found to have a portal vein thrombosis.  Etiology unclear.  Recommend additional work-up including prothrombin gene mutation, JAK2 mutation, and factor V Leiden.  Continue on heparin drip for now.  Discussed other anticoagulation with the patient including DOAC versus Lovenox versus warfarin.  The patient is not interested in injections.  Final recommendation for anticoagulation per [name].   Mikey Bussing, NP 03/15/2020 9:31 AM   ADDENDUM: I met with Ms. Hladik in her room and updated the APP note above.  I discussed with Ms. Quinton that her portal vein thrombosis is not acute.  It has cavernous changes but means it spread been present for some time.  Certainly inflammation can cause a hypercoagulable state and the most likely reason for the portal vein thrombosis is diverticulitis.  Nevertheless mesenteric and portal vein thrombosis is strongly associated with JAK2 mutations, less so with a prothrombin gene or factor V Leiden mutations.  All these probes have been sent.  They generally take a couple of weeks to come back and I am going to schedule  the patient to see me in the clinic sometime mid to late January at to discuss those results.  In the meantime she will need to be anticoagulated a minimum of 80month.  If her insurance will cover apixaban or rivaroxaban I would be comfortable with those choices.  Appreciate consulting on this patient.  We will continue to follow with you during this hospitalization.  GChauncey Cruel MD Medical Oncology and Hematology [facility]56 Woodland CourtATarentum San Rafael [id number]. [phone]   Fax. [phone]

## 2020-03-15 NOTE — Progress Notes (Signed)
PROGRESS NOTE    Paula Stein   WYO:[id number]  DOB: 1946-01-22  DOA: 03/14/2020     1  PCP: [name], MD  CC: abdominal pain  Hospital Course: Ms. Bloodsworth is a 74 yo female with PMH hyperthyroidism (treated with surgery and RAI, now on replacement hormone), DMII, sCHF, HTN who presented with LLQ abdominal pain. She had also reported that she had an episode of stool incontinence when initially having her spell of abdominal pain. This was new for her and she had no prior episodes of similar.  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  She was started on Zosyn and a heparin drip. Oncology was consulted; labs were drawn for further hypercoag workup.    Interval History:  Seen this morning in her room.  Still having some abdominal pain but overall is improved.  Confirms story told in H&P. Understands plan is to monitor diverticulitis response and transition to oral anticoagulation vs Lovenox (pending further discussion with heme/onc). No prior history of clots, cancer, or diverticulitis.   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; no personal history of CA nor family history; sounds like history of Grave's and other medical co-morbidities; no prior history of clots either nor in family - continue heparin drip - appreciate heme/onc consult - follow up pending hypercoag labs   Diabetes mellitus without complication ([place]) -Continue SSI and  CBG monitoring  CAD (coronary artery disease) -No chest pain -If BP tolerates, continue aspirin, Lipitor, Coreg, Lasix, Imdur, Entresto, Aldactone (see hypotension)  Chronic combined systolic and diastolic CHF (congestive heart failure) (HCC) -If BP tolerates, continue aspirin, Lipitor, Coreg, Lasix, Imdur, Entresto, Aldactone (see hypotension)  Hyperlipidemia associated with type 2 diabetes mellitus ([place]) - Continue statin  Essential hypertension -She states blood pressure is typically elevated at home, 774J to 287O systolically -Currently BP is low normal this morning -will add parameters for current meds ordered  Hypothyroidism -Continue Synthroid    Antimicrobials: Zosyn 12/12 >> current  DVT prophylaxis: Heparin drip Code Status: Full Family Communication: none present Disposition Plan: Status is: Inpatient  Remains inpatient appropriate because:Ongoing diagnostic testing needed not appropriate for outpatient work up, IV treatments appropriate due to intensity of illness or inability to take PO and Inpatient level of care appropriate due to severity of illness   Dispo: The patient is from: Home              Anticipated d/c is to: Home              Anticipated d/c date is: 2 days              Patient currently is not medically stable to d/c.   Objective: Blood pressure 136/60, pulse (!) 57, temperature 97.8 F (36.6 C), temperature source Oral, resp. rate (!) 21, height 5\' 10"  (1.778 m), weight 95.1 kg, SpO2 100 %.  Examination: General appearance: alert, cooperative and no distress Head: Normocephalic, without obvious abnormality, atraumatic Eyes: EOMI Lungs: clear to auscultation bilaterally Heart: regular rate and rhythm and S1,  S2 normal Abdomen: some TTP notd in left quadrants, BS present Extremities: no edema Skin: mobility and turgor normal Neurologic: Grossly normal  Consultants:   Heme/onc  Procedures:     Data Reviewed: I have personally  reviewed following labs and imaging studies Results for orders placed or performed during the hospital encounter of 03/14/20 (from the past 24 hour(s))  Heparin level (unfractionated)     Status: Abnormal   Collection Time: 03/14/20  4:58 PM  Result Value Ref Range   Heparin Unfractionated <0.10 (L) 0.30 - 0.70 IU/mL  Hemoglobin A1c     Status: Abnormal   Collection Time: 03/14/20  4:58 PM  Result Value Ref Range   Hgb A1c MFr Bld 6.0 (H) 4.8 - 5.6 %   Mean Plasma Glucose 125.5 mg/dL  Glucose, capillary     Status: Abnormal   Collection Time: 03/14/20  6:01 PM  Result Value Ref Range   Glucose-Capillary 146 (H) 70 - 99 mg/dL  Glucose, capillary     Status: None   Collection Time: 03/14/20  9:05 PM  Result Value Ref Range   Glucose-Capillary 73 70 - 99 mg/dL  CBC     Status: Abnormal   Collection Time: 03/15/20 12:58 AM  Result Value Ref Range   WBC 8.9 4.0 - 10.5 K/uL   RBC 3.74 (L) 3.87 - 5.11 MIL/uL   Hemoglobin 10.8 (L) 12.0 - 15.0 g/dL   HCT 37.8 36.0 - 46.0 %   MCV 101.1 (H) 80.0 - 100.0 fL   MCH 28.9 26.0 - 34.0 pg   MCHC 28.6 (L) 30.0 - 36.0 g/dL   RDW 14.2 11.5 - 15.5 %   Platelets 197 150 - 400 K/uL   nRBC 0.0 0.0 - 0.2 %  Comprehensive metabolic panel     Status: Abnormal   Collection Time: 03/15/20 12:58 AM  Result Value Ref Range   Sodium 138 135 - 145 mmol/L   Potassium 3.3 (L) 3.5 - 5.1 mmol/L   Chloride 107 98 - 111 mmol/L   CO2 24 22 - 32 mmol/L   Glucose, Bld 126 (H) 70 - 99 mg/dL   BUN 11 8 - 23 mg/dL   Creatinine, Ser 1.51 (H) 0.44 - 1.00 mg/dL   Calcium 8.5 (L) 8.9 - 10.3 mg/dL   Total Protein 6.4 (L) 6.5 - 8.1 g/dL   Albumin 2.8 (L) 3.5 - 5.0 g/dL   AST 11 (L) 15 - 41 U/L   ALT 11 0 - 44 U/L   Alkaline Phosphatase 80 38 - 126 U/L   Total Bilirubin 0.5 0.3 - 1.2 mg/dL   GFR, Estimated 36 (L) >60 mL/min   Anion gap 7 5 - 15  Heparin level (unfractionated)     Status: Abnormal   Collection Time: 03/15/20 12:58 AM  Result Value Ref Range    Heparin Unfractionated 0.80 (H) 0.30 - 0.70 IU/mL  Glucose, capillary     Status: Abnormal   Collection Time: 03/15/20  7:44 AM  Result Value Ref Range   Glucose-Capillary 108 (H) 70 - 99 mg/dL  Glucose, capillary     Status: Abnormal   Collection Time: 03/15/20 11:17 AM  Result Value Ref Range   Glucose-Capillary 115 (H) 70 - 99 mg/dL  Heparin level (unfractionated)     Status: None   Collection Time: 03/15/20 12:01 PM  Result Value Ref Range   Heparin Unfractionated 0.34 0.30 - 0.70 IU/mL    Recent Results (from the past 240 hour(s))  Urine  culture     Status: Abnormal (Preliminary result)   Collection Time: 03/14/20 10:25 AM   Specimen: Urine, Random  Result Value Ref Range Status   Specimen Description   Final    URINE, RANDOM Performed at [facility], [place] [address] [place], [place] [address]    Special Requests   Final    NONE Performed at [facility], [place] [address] [place], [place] [address]    Culture (A)  Final    >=100,000 COLONIES/mL ESCHERICHIA COLI CULTURE REINCUBATED FOR BETTER GROWTH SUSCEPTIBILITIES TO FOLLOW Performed at [facility], [place] [address] [place], [place] [address]    Report Status PENDING  Incomplete  Resp Panel by RT-PCR (Flu A&B, Covid) Nasopharyngeal Swab     Status: None   Collection Time: 03/14/20 10:37 AM   Specimen: Nasopharyngeal Swab; Nasopharyngeal(NP) swabs in vial transport medium  Result Value Ref Range Status   SARS Coronavirus 2 by RT PCR NEGATIVE NEGATIVE Final    Comment: (NOTE) SARS-CoV-2 target nucleic acids are NOT DETECTED.  The SARS-CoV-2 RNA is generally detectable in upper respiratory specimens during the acute phase of infection. The lowest concentration of SARS-CoV-2 viral copies this assay can detect is 138 copies/mL. A negative result does not preclude SARS-Cov-2 infection and should not be used as the sole basis for treatment or other patient management decisions.  A negative result may occur with  improper specimen collection/handling, submission of specimen other than nasopharyngeal swab, presence of viral mutation(s) within the areas targeted by this assay, and inadequate number of viral copies(<138 copies/mL). A negative result must be combined with clinical observations, patient history, and epidemiological information. The expected result is Negative.  Fact Sheet for Patients:  EntrepreneurPulse.com.au  Fact Sheet for Healthcare Providers:  IncredibleEmployment.be  This test is no t yet approved or cleared by the [place] FDA and  has been authorized for detection and/or diagnosis of SARS-CoV-2 by FDA under an Emergency Use Authorization (EUA). This EUA will remain  in effect (meaning this test can be used) for the duration of the COVID-19 declaration under Section 564(b)(1) of the Act, 21 U.S.C.section 360bbb-3(b)(1), unless the authorization is terminated  or revoked sooner.       Influenza A by PCR NEGATIVE NEGATIVE Final   Influenza B by PCR NEGATIVE NEGATIVE Final    Comment: (NOTE) The Xpert Xpress SARS-CoV-2/FLU/RSV plus assay is intended as an aid in the diagnosis of influenza from Nasopharyngeal swab specimens and should not be used as a sole basis for treatment. Nasal washings and aspirates are unacceptable for Xpert Xpress SARS-CoV-2/FLU/RSV testing.  Fact Sheet for Patients: EntrepreneurPulse.com.au  Fact Sheet for Healthcare Providers: IncredibleEmployment.be  This test is not yet approved or cleared by the [place] FDA and has been authorized for detection and/or diagnosis of SARS-CoV-2 by FDA under an Emergency Use Authorization (EUA). This EUA will remain in effect (meaning this test can be used) for the duration of the COVID-19 declaration under Section 564(b)(1) of the Act, 21 U.S.C. section 360bbb-3(b)(1), unless the authorization  is terminated or revoked.  Performed at [facility], [place] [address] [place], [place] [address]      Radiology Studies: CT ABDOMEN PELVIS W CONTRAST  Result Date: 03/14/2020 CLINICAL DATA:  Acute non localized abdominal pain for the past 4 days. EXAM: CT ABDOMEN AND PELVIS WITH CONTRAST TECHNIQUE: Multidetector CT imaging of the abdomen and pelvis was performed using the standard protocol following bolus administration of  intravenous contrast. CONTRAST:  75mL OMNIPAQUE IOHEXOL 300 MG/ML  SOLN COMPARISON:  07/25/2019; chest radiograph-07/04/2019 FINDINGS: Lower chest: Limited visualization of the lower thorax demonstrates interstitial opacities within the imaged lung bases with associated minimal bronchiectasis but without evidence of honeycombing. Cardiomegaly. Trace amount of pericardial fluid, presumably physiologic. A defibrillator pack is imbedded within the soft tissues of the left lateral chest. Hepatobiliary: Normal hepatic contour. Redemonstrated multiple hypoattenuating nonenhancing hepatic cysts, the largest of which within the medial aspect of the lateral segment of the left lobe of the liver measuring approximately 3.8 cm in diameter (image 25, series 2). No discrete worrisome hepatic lesions. There is occlusion of the right portal vein (representative axial image 21, series 2; coronal image 74, series 5) with associated cavernous transformation and slight atrophy of the posterior segment of the right lobe of the liver in comparison to the left, new compared to the 07/05/2019 examination. The etiology of this apparent occlusion is not depicted on this examination. No ascites. Normal appearance of the gallbladder given degree distention. Pancreas: Normal appearance of the pancreas. Spleen: Normal appearance of the spleen. Adrenals/Urinary Tract: There is symmetric enhancement and excretion of the bilateral kidneys. Note is made of a macroscopic fat containing approximately  2.3 cm angiomyolipoma involving from the inferior medial aspect the left kidney (image 37, series 2). Renal cysts are seen bilaterally with dominant right-sided renal cyst measuring 3.8 cm in diameter (image 15, series 2 and index left renal cyst measuring 3.9 cm (image 31, series 2). Additional subcentimeter hypoattenuating renal lesions are too small to accurately characterize though favored to represent additional renal cysts. No urinary obstruction or perinephric stranding. Normal appearance the bilateral adrenal glands. Normal appearance of the urinary bladder given degree distention. Stomach/Bowel: Extensive diverticulosis involving the majority of the descending and sigmoid colon. There is wall thickening and ill-defined stranding about the sigmoid colon within the left lower abdomen/pelvis (representative image 67, series 2, progressed compared to the 07/2019 examination and worrisome for an area of acute diverticulitis. There is a small amount of fluid is seen within the pelvic cul-de-sac without evidence of perforation or definable/drainable fluid collection. No evidence of perforation or definable/drainable fluid collection. Small hiatal hernia. No evidence of enteric obstruction. Normal appearance of the terminal ileum and the retrocecal appendix. No pneumoperitoneum, pneumatosis or portal venous gas. Vascular/Lymphatic: Scattered atherosclerotic plaque within normal caliber abdominal aorta. Scattered lymph nodes centered within the left hemipelvis are presumably reactive in etiology with index left common iliac chain lymph node measuring 1.1 cm in greatest short axis diameter image 54, series 2 and index left pelvic sidewall lymph node measuring 1.1 cm (image 64, series 2). Otherwise, no bulky retroperitoneal, mesenteric, pelvic or inguinal lymphadenopathy. Reproductive: Post hysterectomy.  No discrete adnexal lesion. Other: Minimal amount of subcutaneous edema about the midline of the low back.  Musculoskeletal: No acute or aggressive osseous abnormalities. Moderate DDD of L5-S1 with disc space height loss, endplate irregularity and sclerosis. Mild-to-moderate degenerative change the bilateral hips, right greater than left, with joint space loss, subchondral sclerosis and osteophytosis. IMPRESSION: 1. Findings worrisome for acute uncomplicated diverticulitis involving the sigmoid colon within the left lower abdomen/pelvis. No evidence of perforation or definable/drainable fluid collection. If not recently performed, further evaluation with colonoscopy after the resolution of acute symptoms is advised to exclude the presence of an underlying mass. 2. 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 3. Small hiatal hernia. 4. Bilateral renal cysts. 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 5.  Aortic Atherosclerosis (ICD10-I70.0). Electronically Signed   By: Sandi Mariscal M.D.   On: 03/14/2020 08:51   CT ABDOMEN PELVIS W CONTRAST  Final Result      Scheduled Meds: . aspirin EC  81 mg Oral Daily  . atorvastatin  40 mg Oral q1800  . carvedilol  25 mg Oral BID WC  . furosemide  40 mg Oral BID  . insulin aspart  0-15 Units Subcutaneous TID WC  . insulin aspart  0-5 Units Subcutaneous QHS  . isosorbide mononitrate  30 mg Oral Daily  . levothyroxine  125 mcg Oral Q0600  . sacubitril-valsartan  1 tablet Oral BID  . spironolactone  50 mg Oral Daily   PRN Meds: acetaminophen, morphine injection, ondansetron  **OR** ondansetron (ZOFRAN) IV Continuous Infusions: . heparin 1,300 Units/hr (03/15/20 1340)  . piperacillin-tazobactam (ZOSYN)  IV 3.375 g (03/15/20 1450)     LOS: 1 day  Time spent: Greater than 50% of the 35 minute visit was spent in counseling/coordination of care for the patient as laid out in the A&P.   Dwyane Dee, MD [facility] 03/15/2020, 3:00 PM

## 2020-03-15 NOTE — Assessment & Plan Note (Signed)
-  No chest pain -If BP tolerates, continue aspirin, Lipitor, Coreg, Lasix, Imdur, Entresto, Aldactone (see hypotension)

## 2020-03-15 NOTE — Assessment & Plan Note (Signed)
-   Continue SSI and CBG monitoring ?

## 2020-03-15 NOTE — Assessment & Plan Note (Signed)
Continue Synthroid °

## 2020-03-16 DIAGNOSIS — I2510 Atherosclerotic heart disease of native coronary artery without angina pectoris: Secondary | ICD-10-CM

## 2020-03-16 DIAGNOSIS — I10 Essential (primary) hypertension: Secondary | ICD-10-CM

## 2020-03-16 DIAGNOSIS — E119 Type 2 diabetes mellitus without complications: Secondary | ICD-10-CM

## 2020-03-16 DIAGNOSIS — K5732 Diverticulitis of large intestine without perforation or abscess without bleeding: Principal | ICD-10-CM

## 2020-03-16 DIAGNOSIS — I5042 Chronic combined systolic (congestive) and diastolic (congestive) heart failure: Secondary | ICD-10-CM

## 2020-03-16 DIAGNOSIS — E039 Hypothyroidism, unspecified: Secondary | ICD-10-CM

## 2020-03-16 LAB — GLUCOSE, CAPILLARY
Glucose-Capillary: 124 mg/dL — ABNORMAL HIGH (ref 70–99)
Glucose-Capillary: 112 mg/dL — ABNORMAL HIGH (ref 70–99)
Glucose-Capillary: 77 mg/dL (ref 70–99)
Glucose-Capillary: 128 mg/dL — ABNORMAL HIGH (ref 70–99)

## 2020-03-16 LAB — URINE CULTURE: Culture: >=100000 — AB

## 2020-03-16 LAB — CBC WITH DIFFERENTIAL/PLATELET
Abs Immature Granulocytes: 0.03 10*3/uL (ref 0.00–0.07)
Basophils Absolute: 0 10*3/uL (ref 0.0–0.1)
Basophils Relative: 0 %
Eosinophils Absolute: 0.1 10*3/uL (ref 0.0–0.5)
Eosinophils Relative: 2 %
HCT: 38.1 % (ref 36.0–46.0)
Hemoglobin: 11.7 g/dL — ABNORMAL LOW (ref 12.0–15.0)
Immature Granulocytes: 0 %
Lymphocytes Relative: 31 %
Lymphs Abs: 2.2 10*3/uL (ref 0.7–4.0)
MCH: 29.1 pg (ref 26.0–34.0)
MCHC: 30.7 g/dL (ref 30.0–36.0)
MCV: 94.8 fL (ref 80.0–100.0)
Monocytes Absolute: 0.7 10*3/uL (ref 0.1–1.0)
Monocytes Relative: 10 %
Neutro Abs: 3.9 10*3/uL (ref 1.7–7.7)
Neutrophils Relative %: 57 %
Platelets: 236 10*3/uL (ref 150–400)
RBC: 4.02 MIL/uL (ref 3.87–5.11)
RDW: 13.9 % (ref 11.5–15.5)
WBC: 7 10*3/uL (ref 4.0–10.5)
nRBC: 0 % (ref 0.0–0.2)

## 2020-03-16 LAB — BASIC METABOLIC PANEL
Anion gap: 11 (ref 5–15)
BUN: 12 mg/dL (ref 8–23)
CO2: 23 mmol/L (ref 22–32)
Calcium: 8.5 mg/dL — ABNORMAL LOW (ref 8.9–10.3)
Chloride: 105 mmol/L (ref 98–111)
Creatinine, Ser: 1.38 mg/dL — ABNORMAL HIGH (ref 0.44–1.00)
GFR, Estimated: 40 mL/min — ABNORMAL LOW (ref >60)
Glucose, Bld: 130 mg/dL — ABNORMAL HIGH (ref 70–99)
Potassium: 3.5 mmol/L (ref 3.5–5.1)
Sodium: 139 mmol/L (ref 135–145)

## 2020-03-16 LAB — HEPARIN LEVEL (UNFRACTIONATED): Heparin Unfractionated: 0.53 IU/mL (ref 0.30–0.70)

## 2020-03-16 LAB — MAGNESIUM: Magnesium: 2.1 mg/dL (ref 1.7–2.4)

## 2020-03-16 MED ORDER — RIVAROXABAN 20 MG PO TABS: 20.0000 mg | ORAL_TABLET | Freq: Every day | ORAL | Status: DC

## 2020-03-16 MED ORDER — RIVAROXABAN 15 MG PO TABS
15.0000 mg | ORAL_TABLET | Freq: Two times a day (BID) | ORAL | Status: DC
  Administered 2020-03-16 – 2020-03-17 (×2): 15 mg via ORAL
  Filled 2020-03-16 (×2): qty 1

## 2020-03-16 MED ORDER — APIXABAN 5 MG PO TABS: 5.0000 mg | ORAL_TABLET | Freq: Two times a day (BID) | ORAL | Status: DC

## 2020-03-16 MED ORDER — APIXABAN 5 MG PO TABS
10.0000 mg | ORAL_TABLET | Freq: Two times a day (BID) | ORAL | Status: DC
  Administered 2020-03-16: 11:00:00 10 mg via ORAL
  Filled 2020-03-16: qty 2

## 2020-03-16 MED ORDER — ALLOPURINOL 100 MG PO TABS
100.0000 mg | ORAL_TABLET | Freq: Every day | ORAL | Status: DC | PRN
  Administered 2020-03-16: 18:00:00 100 mg via ORAL
  Filled 2020-03-16: qty 1

## 2020-03-16 NOTE — Discharge Instructions (Signed)
Information on my medicine - XARELTO (rivaroxaban)  This medication education was reviewed with me or my healthcare representative as part of my discharge preparation.  The pharmacist that spoke with me during my hospital stay was:  Emiliano Dyer, [facility]  WHY WAS [place] YOU? Xarelto was prescribed to treat blood clots that may have been found in the veins of your legs (deep vein thrombosis) or in your lungs (pulmonary embolism) and to reduce the risk of them occurring again.  What do you need to know about Xarelto? The starting dose is one 15 mg tablet taken TWICE daily with food for the FIRST 21 DAYS then on April 06, 2020  the dose is changed to one 20 mg tablet taken ONCE A DAY with your evening meal.  DO NOT stop taking Xarelto without talking to the health care provider who prescribed the medication.  Refill your prescription for 20 mg tablets before you run out.  After discharge, you should have regular check-up appointments with your healthcare provider that is prescribing your Xarelto.  In the future your dose may need to be changed if your kidney function changes by a significant amount.  What do you do if you miss a dose? If you are taking Xarelto TWICE DAILY and you miss a dose, take it as soon as you remember. You may take two 15 mg tablets (total 30 mg) at the same time then resume your regularly scheduled 15 mg twice daily the next day.  If you are taking Xarelto ONCE DAILY and you miss a dose, take it as soon as you remember on the same day then continue your regularly scheduled once daily regimen the next day. Do not take two doses of Xarelto at the same time.   Important Safety Information Xarelto is a blood thinner medicine that can cause bleeding. You should call your healthcare provider right away if you experience any of the following: ? Bleeding from an injury or your nose that does not stop. ? Unusual colored urine (red or dark brown) or  unusual colored stools (red or black). ? Unusual bruising for unknown reasons. ? A serious fall or if you hit your head (even if there is no bleeding).  Some medicines may interact with Xarelto and might increase your risk of bleeding while on Xarelto. To help avoid this, consult your healthcare provider or pharmacist prior to using any new prescription or non-prescription medications, including herbals, vitamins, non-steroidal anti-inflammatory drugs (NSAIDs) and supplements.  This website has more information on Xarelto: [email or web address]-benson.[email or web address]

## 2020-03-16 NOTE — Progress Notes (Signed)
Patient ID: Paula Stein, female   DOB: 1945-12-24, 74 y.o.   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  On presentation CT abd/pelvis  was notable for acute uncomplicated sigmoid diverticulitis as well as thrombus in the right portal vein.  She was started on Zosyn and a heparin drip. Oncology was consulted   Assessment & Plan:   Acute sigmoid diverticulitis -No prior history -Currently on Zosyn.  Still with significant abdominal pain requiring IV morphine. -will possibly switch to oral antibiotics in 1 to 2 days if pain gets better -will need outpatient evaluation and follow-up by GI in 6 to 8 weeks for possible colonoscopy  Portal vein thrombosis -Heme-onc evaluation appreciated: She will need at least 6 months of anticoagulation and Haemonchus okay for the patient to be on apixaban or rivaroxaban.   -Currently on heparin drip: will switch to Eliquis.  Care management consult.  Coronary artery disease -Stable.  Continue statin, Coreg, isosorbide mononitrate.  DC aspirin as patient will be started on Eliquis  Chronic combined systolic and diastolic CHF -Stable.  Strict input and output.  Fluid restriction.  Daily weights.  Continue Coreg, Imdur, Lasix, Entresto, spironolactone  Essential hypertension -Blood pressure stable.  Continue current regimen  Hypothyroidism -Continue Synthroid.  Hyperlipidemia -Continue statin  Diabetes mellitus type 2 -A1c 6.  Blood sugars stable.  Continue CBGs with SSI   DVT prophylaxis: Heparin drip Code Status: Partial Family Communication: None at bedside Disposition Plan: Status is: Inpatient  Remains inpatient appropriate because:Inpatient level of care  appropriate due to severity of illness   Dispo: The patient is from: Home              Anticipated d/c is to: Home              Anticipated d/c date is: 1 day              Patient currently is not medically stable to d/c.  Consultants: Hematology  Procedures: None  Antimicrobials: Zosyn from 03/14/2020 onwards   Subjective: Patient seen and examined at bedside.  States that her abdominal pain is improving but still a 7 out of 10 abdominal pain.  No bowel movement yet.  Denies worsening shortness of breath, chest pain.  Objective: Vitals:   03/15/20 2123 03/16/20 0436 03/16/20 0754 03/16/20 0941  BP: (!) 124/52 (!) 149/72 (!) 148/82 (!) 112/53  Pulse: 65 66 66 62  Resp:  20    Temp: 98.2 F (36.8 C) 97.9 F (36.6 C)    TempSrc: Oral Oral    SpO2:  99% 96%   Weight:      Height:        Intake/Output Summary (Last 24 hours) at 03/16/2020 0957 Last data filed at 03/16/2020 0901 Gross per 24 hour  Intake 240 ml  Output --  Net 240 ml   Filed Weights   03/14/20 0517 03/14/20 0541 03/15/20 0453  Weight: 95.3 kg 95.3 kg 95.1 kg    Examination:  General exam: Appears calm and comfortable  Respiratory system: Bilateral decreased breath sounds at bases Cardiovascular system: S1 & S2 heard, Rate controlled Gastrointestinal system: Abdomen is nondistended, soft and tender in the left lower quadrant, normal  bowel sounds heard. Extremities: No cyanosis, clubbing; trace lower extremity edema present Central nervous system: Alert and oriented. No focal neurological deficits. Moving extremities Skin: No rashes, lesions or ulcers Psychiatry: Judgement and insight appear normal. Mood & affect appropriate.     Data Reviewed: I have personally reviewed following labs and imaging studies  CBC: Recent Labs  Lab 03/13/20 2218 03/15/20 0058 03/16/20 0731  WBC 10.7* 8.9 7.0  NEUTROABS  --   --  3.9  HGB 12.9 10.8* 11.7*  HCT 43.2 37.8 38.1  MCV 95.2 101.1* 94.8  PLT 232  197 458   Basic Metabolic Panel: Recent Labs  Lab 03/13/20 2218 03/15/20 0058 03/16/20 0731  NA 138 138 139  K 3.9 3.3* 3.5  CL 108 107 105  CO2 21* 24 23  GLUCOSE 124* 126* 130*  BUN 11 11 12   CREATININE 1.34* 1.51* 1.38*  CALCIUM 8.9 8.5* 8.5*  MG  --   --  2.1   GFR: Estimated Creatinine Clearance: 43.3 mL/min (A) (by C-G formula based on SCr of 1.38 mg/dL (H)). Liver Function Tests: Recent Labs  Lab 03/13/20 2218 03/15/20 0058  AST 15 11*  ALT 13 11  ALKPHOS 103 80  BILITOT 0.8 0.5  PROT 7.3 6.4*  ALBUMIN 3.1* 2.8*   Recent Labs  Lab 03/13/20 2218  LIPASE 17   No results for input(s): AMMONIA in the last 168 hours. Coagulation Profile: No results for input(s): INR, PROTIME in the last 168 hours. Cardiac Enzymes: No results for input(s): CKTOTAL, CKMB, CKMBINDEX, TROPONINI in the last 168 hours. BNP (last 3 results) No results for input(s): PROBNP in the last 8760 hours. HbA1C: Recent Labs    03/14/20 1658  HGBA1C 6.0*   CBG: Recent Labs  Lab 03/15/20 0744 03/15/20 1117 03/15/20 1709 03/15/20 2122 03/16/20 0726  GLUCAP 108* 115* 103* 128* 124*   Lipid Profile: No results for input(s): CHOL, HDL, LDLCALC, TRIG, CHOLHDL, LDLDIRECT in the last 72 hours. Thyroid Function Tests: No results for input(s): TSH, T4TOTAL, FREET4, T3FREE, THYROIDAB in the last 72 hours. Anemia Panel: No results for input(s): VITAMINB12, FOLATE, FERRITIN, TIBC, IRON, RETICCTPCT in the last 72 hours. Sepsis Labs: No results for input(s): PROCALCITON, LATICACIDVEN in the last 168 hours.  Recent Results (from the past 240 hour(s))  Urine culture     Status: Abnormal (Preliminary result)   Collection Time: 03/14/20 10:25 AM   Specimen: Urine, Random  Result Value Ref Range Status   Specimen Description   Final    URINE, RANDOM Performed at [place] [facility] [address] [place], [place] [address]    Special Requests   Final    NONE Performed at  [facility], [place] [address] [place], [place] [address]    Culture (A)  Final    >=100,000 COLONIES/mL ESCHERICHIA COLI SUSCEPTIBILITIES TO FOLLOW Performed at [facility], [place] [address], [place], [place] [address]    Report Status PENDING  Incomplete  Resp Panel by RT-PCR (Flu A&B, Covid) Nasopharyngeal Swab     Status: None   Collection Time: 03/14/20 10:37 AM   Specimen: Nasopharyngeal Swab; Nasopharyngeal(NP) swabs in vial transport medium  Result Value Ref Range Status   SARS Coronavirus 2 by RT PCR NEGATIVE NEGATIVE Final    Comment: (NOTE) SARS-CoV-2 target nucleic acids are NOT DETECTED.  The SARS-CoV-2 RNA is generally detectable in upper respiratory specimens during the acute phase of infection. The lowest concentration of SARS-CoV-2 viral copies this  assay can detect is 138 copies/mL. A negative result does not preclude SARS-Cov-2 infection and should not be used as the sole basis for treatment or other patient management decisions. A negative result may occur with  improper specimen collection/handling, submission of specimen other than nasopharyngeal swab, presence of viral mutation(s) within the areas targeted by this assay, and inadequate number of viral copies(<138 copies/mL). A negative result must be combined with clinical observations, patient history, and epidemiological information. The expected result is Negative.  Fact Sheet for Patients:  EntrepreneurPulse.com.au  Fact Sheet for Healthcare Providers:  IncredibleEmployment.be  This test is no t yet approved or cleared by the [place] FDA and  has been authorized for detection and/or diagnosis of SARS-CoV-2 by FDA under an Emergency Use Authorization (EUA). This EUA will remain  in effect (meaning this test can be used) for the duration of the COVID-19 declaration under Section 564(b)(1) of the Act, 21 U.S.C.section 360bbb-3(b)(1), unless  the authorization is terminated  or revoked sooner.       Influenza A by PCR NEGATIVE NEGATIVE Final   Influenza B by PCR NEGATIVE NEGATIVE Final    Comment: (NOTE) The Xpert Xpress SARS-CoV-2/FLU/RSV plus assay is intended as an aid in the diagnosis of influenza from Nasopharyngeal swab specimens and should not be used as a sole basis for treatment. Nasal washings and aspirates are unacceptable for Xpert Xpress SARS-CoV-2/FLU/RSV testing.  Fact Sheet for Patients: EntrepreneurPulse.com.au  Fact Sheet for Healthcare Providers: IncredibleEmployment.be  This test is not yet approved or cleared by the [place] FDA and has been authorized for detection and/or diagnosis of SARS-CoV-2 by FDA under an Emergency Use Authorization (EUA). This EUA will remain in effect (meaning this test can be used) for the duration of the COVID-19 declaration under Section 564(b)(1) of the Act, 21 U.S.C. section 360bbb-3(b)(1), unless the authorization is terminated or revoked.  Performed at [facility], [place] [address] [place], [place] [address]          Radiology Studies: No results found.      Scheduled Meds: . aspirin EC  81 mg Oral Daily  . atorvastatin  40 mg Oral q1800  . carvedilol  25 mg Oral BID WC  . furosemide  40 mg Oral BID  . insulin aspart  0-15 Units Subcutaneous TID WC  . insulin aspart  0-5 Units Subcutaneous QHS  . isosorbide mononitrate  30 mg Oral Daily  . levothyroxine  125 mcg Oral Q0600  . sacubitril-valsartan  1 tablet Oral BID  . spironolactone  50 mg Oral Daily   Continuous Infusions: . heparin 1,550 Units/hr (03/15/20 2358)  . piperacillin-tazobactam (ZOSYN)  IV 3.375 g (03/16/20 3734)          Aline August, MD [facility] 03/16/2020, 9:57 AM

## 2020-03-16 NOTE — TOC Benefit Eligibility Note (Signed)
Transition of Care [facility]) Benefit Eligibility Note    Patient Details  Name: Paula Stein MRN: [id number] Date of Birth: 07-May-1945   Medication/Dose: Eliquis 58m 2 xday for 30 days generic not on formulary  Covered?: Yes  Tier: 3 Drug  Prescription Coverage Preferred Pharmacy: local  Spoke with Person/Company/Phone Number:: [place] [phone] Co-Pay: $40.00  Prior Approval: Yes  Deductible: Met  Additional Notes: requires prior auth [id number]   FKerin SalenPhone Number: 03/16/2020, 11:13 AM

## 2020-03-16 NOTE — TOC Benefit Eligibility Note (Signed)
Transition of Care [facility]) Benefit Eligibility Note    Patient Details  Name: Paula Stein MRN: [id number] Date of Birth: 1946/02/26   Medication/Dose: Xarelto 20 mg daily for 30 days  Covered?: Yes  Tier: 3 Drug  Prescription Coverage Preferred Pharmacy: local  Spoke with Person/Company/Phone Number:: [facility] [phone]  Co-Pay: $40.00  Prior Approval: No  Deductible: Met      [name] Phone Number: 03/16/2020, 2:30 PM

## 2020-03-16 NOTE — TOC Benefit Eligibility Note (Signed)
Transition of Care [facility]) Benefit Eligibility Note    Patient Details  Name: Paula Stein MRN: [id number] Date of Birth: March 10, 1946   Medication/Dose: Xarelto 20 mg daily for 30 days  Covered?: Yes  Tier: 3 Drug  Prescription Coverage Preferred Pharmacy: local  Spoke with Person/Company/Phone Number:: [facility] [phone]  Co-Pay: $40.00  Prior Approval: No  Deductible: Met  Additional Notes: requires prior auth [id number]    [name] Phone Number: 03/16/2020, 12:13 PM

## 2020-03-16 NOTE — Evaluation (Signed)
Physical Therapy Evaluation Patient Details Name: Paula Stein MRN: [id number] DOB: 04-20-1945 Today's Date: 03/16/2020   History of Present Illness  74 yo female admitted with diverticulitis, portal vein thrombus. Hx of DM, HF, gout  Clinical Impression  On eval, pt was Supv-Mod I for mobility. She walked ~175 feet around the unit. Pt c/o abdominal and L foot pain-8/10. She mobilizes well despite the pain. Do not anticipate any f/u PT needs after discharge. Will follow during hospital stay.     Follow Up Recommendations No PT follow up    Equipment Recommendations  None recommended by PT    Recommendations for Other Services       Precautions / Restrictions Precautions Precautions: Fall Restrictions Weight Bearing Restrictions: No      Mobility  Bed Mobility Overal bed mobility: Modified Independent                  Transfers Overall transfer level: Modified independent                  Ambulation/Gait Ambulation/Gait assistance: Supervision Gait Distance (Feet): 175 Feet Assistive device: IV Pole Gait Pattern/deviations: Step-through pattern;Antalgic     General Gait Details: gait mildly antalgic  Stairs            Wheelchair Mobility    Modified Rankin (Stroke Patients Only)       Balance                                             Pertinent Vitals/Pain Pain Assessment: 0-10 Pain Score: 7  Pain Location: abdomen, L foot Pain Descriptors / Indicators: Aching;Sore Pain Intervention(s): Monitored during session    Home Living Family/patient expects to be discharged to:: Private residence Living Arrangements: Children Available Help at Discharge: Family;Available PRN/intermittently;Friend(s) Type of Home: House Home Access: Stairs to enter     Home Layout: Two level;Able to live on main level with bedroom/bathroom Home Equipment: Kasandra Knudsen - single point;Walker - 4 wheels      Prior Function Level of  Independence: Independent               Hand Dominance        Extremity/Trunk Assessment   Upper Extremity Assessment Upper Extremity Assessment: Overall WFL for tasks assessed    Lower Extremity Assessment Lower Extremity Assessment: Overall WFL for tasks assessed    Cervical / Trunk Assessment Cervical / Trunk Assessment: Normal  Communication   Communication: No difficulties  Cognition Arousal/Alertness: Awake/alert Behavior During Therapy: WFL for tasks assessed/performed Overall Cognitive Status: Within Functional Limits for tasks assessed                                        General Comments      Exercises     Assessment/Plan    PT Assessment Patient needs continued PT services  PT Problem List Decreased mobility;Pain       PT Treatment Interventions Gait training;Therapeutic activities;Therapeutic exercise;Patient/family education;Functional mobility training    PT Goals (Current goals can be found in the Care Plan section)  Acute Rehab PT Goals Patient Stated Goal: home soon PT Goal Formulation: With patient Time For Goal Achievement: 03/30/20 Potential to Achieve Goals: Good    Frequency Min 3X/week   Barriers to discharge  Co-evaluation               AM-PAC PT "6 Clicks" Mobility  Outcome Measure Help needed turning from your back to your side while in a flat bed without using bedrails?: None Help needed moving from lying on your back to sitting on the side of a flat bed without using bedrails?: None Help needed moving to and from a bed to a chair (including a wheelchair)?: None Help needed standing up from a chair using your arms (e.g., wheelchair or bedside chair)?: None Help needed to walk in hospital room?: A Little Help needed climbing 3-5 steps with a railing? : A Little 6 Click Score: 22    End of Session   Activity Tolerance: Patient tolerated treatment well Patient left: in bed;with call  bell/phone within reach   PT Visit Diagnosis: Unsteadiness on feet (R26.81);Pain    Time: 3291-9166 PT Time Calculation (min) (ACUTE ONLY): 19 min   Charges:   PT Evaluation $PT Eval Low Complexity: [place], PT Acute Rehabilitation  Office: [phone] Pager: [phone]

## 2020-03-16 NOTE — Progress Notes (Addendum)
ANTICOAGULATION CONSULT NOTE - Follow Up Consult  Pharmacy Consult for Heparin Indication: portal vein occlusion  Allergies  Allergen Reactions  . Ibuprofen Nausea Only and Other (See Comments)    Patient Measurements: Height: 5\' 10"  (177.8 cm) Weight: 95.1 kg (209 lb 11.2 oz) IBW/kg (Calculated) : 68.5 Heparin Dosing Weight: 88 kg  Vital Signs: Temp: 97.9 F (36.6 C) (12/14 0436) Temp Source: Oral (12/14 0436) BP: 148/82 (12/14 0754) Pulse Rate: 66 (12/14 0754)  Labs: Recent Labs    03/13/20 2218 03/14/20 1658 03/15/20 0058 03/15/20 1201 03/15/20 2203 03/16/20 0731  HGB 12.9  --  10.8*  --   --  11.7*  HCT 43.2  --  37.8  --   --  38.1  PLT 232  --  197  --   --  236  HEPARINUNFRC  --    < > 0.80* 0.34 0.15* 0.53  CREATININE 1.34*  --  1.51*  --   --  1.38*   < > = values in this interval not displayed.    Estimated Creatinine Clearance: 43.3 mL/min (A) (by C-G formula based on SCr of 1.38 mg/dL (H)).   Medications:  Scheduled:  . aspirin EC  81 mg Oral Daily  . atorvastatin  40 mg Oral q1800  . carvedilol  25 mg Oral BID WC  . furosemide  40 mg Oral BID  . insulin aspart  0-15 Units Subcutaneous TID WC  . insulin aspart  0-5 Units Subcutaneous QHS  . isosorbide mononitrate  30 mg Oral Daily  . levothyroxine  125 mcg Oral Q0600  . sacubitril-valsartan  1 tablet Oral BID  . spironolactone  50 mg Oral Daily   Infusions:  . heparin 1,550 Units/hr (03/15/20 2358)  . piperacillin-tazobactam (ZOSYN)  IV 3.375 g (03/16/20 0653)   PRN: acetaminophen, morphine injection, ondansetron **OR** ondansetron (ZOFRAN) IV  Assessment: 74 yo female presented with diverticulitis and also urinary symptoms suggestive of UTI also found to have a portal vein occlusion to start IV heparin per pharmacy for new clot. Pharmacy has been consulted for IV heparin.  03/16/2020   Heparin level 0.53, therapeutic on 1550 units/hr  CBC: Hgb low but stable 11.7, Plts wnl  SCr  improved 1.38  No bleeding or complications reported  Goal of Therapy:  Heparin level 0.3-0.7 units/ml Monitor platelets by anticoagulation protocol: Yes   Plan:   Continue heparin drip at 1550 units/hr  Recheck heparin level in 8hrs to verify therapeutic  Monitor closely for signs/symptoms of bleeding  Follow up plan for long-term anticoagulation  Peggyann Juba, PharmD, BCPS Pharmacy: [phone] 03/16/2020, 8:10 AM   Addendum:  Transition to Eliquis today.  DC heparin  Begin Eliquis 10mg  PO bid x 7 days, then 5mg  BID thereafter  Will provide 30-day free coupon voucher  Provide Eliquis education prior to discharge  Peggyann Juba, PharmD, BCPS 03/16/2020 10:23 AM

## 2020-03-17 ENCOUNTER — Telehealth: Payer: Self-pay | Admitting: Family Medicine

## 2020-03-17 LAB — BASIC METABOLIC PANEL
Anion gap: 13 (ref 5–15)
BUN: 14 mg/dL (ref 8–23)
CO2: 25 mmol/L (ref 22–32)
Calcium: 8.7 mg/dL — ABNORMAL LOW (ref 8.9–10.3)
Chloride: 101 mmol/L (ref 98–111)
Creatinine, Ser: 1.55 mg/dL — ABNORMAL HIGH (ref 0.44–1.00)
GFR, Estimated: 35 mL/min — ABNORMAL LOW (ref >60)
Glucose, Bld: 101 mg/dL — ABNORMAL HIGH (ref 70–99)
Potassium: 3.6 mmol/L (ref 3.5–5.1)
Sodium: 139 mmol/L (ref 135–145)

## 2020-03-17 LAB — CBC WITH DIFFERENTIAL/PLATELET
Abs Immature Granulocytes: 0.03 10*3/uL (ref 0.00–0.07)
Basophils Absolute: 0 10*3/uL (ref 0.0–0.1)
Basophils Relative: 1 %
Eosinophils Absolute: 0.1 10*3/uL (ref 0.0–0.5)
Eosinophils Relative: 2 %
HCT: 39.8 % (ref 36.0–46.0)
Hemoglobin: 12.3 g/dL (ref 12.0–15.0)
Immature Granulocytes: 0 %
Lymphocytes Relative: 29 %
Lymphs Abs: 2 10*3/uL (ref 0.7–4.0)
MCH: 29 pg (ref 26.0–34.0)
MCHC: 30.9 g/dL (ref 30.0–36.0)
MCV: 93.9 fL (ref 80.0–100.0)
Monocytes Absolute: 0.6 10*3/uL (ref 0.1–1.0)
Monocytes Relative: 8 %
Neutro Abs: 4.3 10*3/uL (ref 1.7–7.7)
Neutrophils Relative %: 60 %
Platelets: 251 10*3/uL (ref 150–400)
RBC: 4.24 MIL/uL (ref 3.87–5.11)
RDW: 13.9 % (ref 11.5–15.5)
WBC: 7.1 10*3/uL (ref 4.0–10.5)
nRBC: 0 % (ref 0.0–0.2)

## 2020-03-17 LAB — MAGNESIUM: Magnesium: 2 mg/dL (ref 1.7–2.4)

## 2020-03-17 LAB — FACTOR 5 LEIDEN

## 2020-03-17 LAB — GLUCOSE, CAPILLARY: Glucose-Capillary: 144 mg/dL — ABNORMAL HIGH (ref 70–99)

## 2020-03-17 MED ORDER — RIVAROXABAN (XARELTO) VTE STARTER PACK (15 & 20 MG): ORAL_TABLET | ORAL | 0 refills | Status: DC

## 2020-03-17 MED ORDER — ONDANSETRON HCL 4 MG PO TABS: 4.0000 mg | ORAL_TABLET | Freq: Four times a day (QID) | ORAL | 0 refills | Status: DC | PRN

## 2020-03-17 MED ORDER — OMEPRAZOLE 40 MG PO CPDR: 40.0000 mg | DELAYED_RELEASE_CAPSULE | Freq: Every day | ORAL | Status: DC | PRN

## 2020-03-17 MED ORDER — CIPROFLOXACIN HCL 500 MG PO TABS: 500.0000 mg | ORAL_TABLET | Freq: Two times a day (BID) | ORAL | 0 refills | Status: AC

## 2020-03-17 MED ORDER — METRONIDAZOLE 500 MG PO TABS: 500.0000 mg | ORAL_TABLET | Freq: Three times a day (TID) | ORAL | 0 refills | Status: AC

## 2020-03-17 MED ORDER — TRAMADOL HCL 50 MG PO TABS: 50.0000 mg | ORAL_TABLET | Freq: Four times a day (QID) | ORAL | 0 refills | Status: DC | PRN

## 2020-03-17 MED ORDER — TRAMADOL HCL 50 MG PO TABS: 50.0000 mg | ORAL_TABLET | Freq: Four times a day (QID) | ORAL | Status: DC | PRN

## 2020-03-17 MED ORDER — ACETAMINOPHEN 325 MG PO TABS: 650.0000 mg | ORAL_TABLET | ORAL | Status: DC | PRN

## 2020-03-17 NOTE — Care Management Important Message (Signed)
Important Message  Patient Details IM Letter given to the Patient. Name: Paula Stein MRN: [id number] Date of Birth: Sep 05, 1945   Medicare Important Message Given:  Yes     [name] 03/17/2020, 9:38 AM

## 2020-03-17 NOTE — Discharge Summary (Signed)
Physician Discharge Summary  Paula Stein SWH:[id number] DOB: 1945-08-27 DOA: 03/14/2020  PCP: [name], MD  Admit date: 03/14/2020 Discharge date: 03/17/2020  Admitted From: Home Disposition: Home  Recommendations for Outpatient Follow-up:  1. Follow up with PCP in 1 week with repeat CBC/BMP 2. Outpatient follow-up with GI 3. Outpatient follow-up with oncology 4. 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  On presentation CT abd/pelvis  was notable for acute uncomplicated sigmoid diverticulitis as well as thrombus in the right portal vein.  She was started on Zosyn and a heparin drip. Oncology was consulted. During the hospitalization, she was switched to oral Xarelto which he has tolerated. She is tolerating diet and feels much better. She will be discharged home today on oral antibiotics and Xarelto with outpatient follow-up with PCP/oncology/GI.   Discharge Diagnoses:   Acute sigmoid diverticulitis -No prior history -Currently on Zosyn.   Abdominal pain has much improved. Currently more dynamically stable with no temperature spikes. -will need outpatient evaluation and follow-up by GI in 6 to 8 weeks for possible colonoscopy -Discharge home today on oral ciprofloxacin and Flagyl for 10 days.  Portal vein thrombosis -Heme-onc evaluation appreciated: She will need at least 6 months of anticoagulation and hematology is okay for the patient to be on apixaban or rivaroxaban.   -Initially treated with heparin drip and subsequently switched to Xarelto which patient has tolerated. Outpatient follow-up with  hematology.  Coronary artery disease -Stable.  Continue statin, Coreg, isosorbide mononitrate.  DC aspirin as patient will be started on Eliquis. Outpatient follow-up with cardiology.  Chronic combined systolic and diastolic CHF -Stable.   Continue Coreg, Imdur, Lasix, Entresto, spironolactone  Essential hypertension -Blood pressure stable.  Continue current regimen  Hypothyroidism -Continue Synthroid.  Hyperlipidemia -Continue statin  Diabetes mellitus type 2 -A1c 6.   Carb modified diet. Continue home regimen. Discharge Instructions  Discharge Instructions    Ambulatory referral to Gastroenterology   Complete by: As directed    Diverticulitis hospital admission followup   Ambulatory referral to Hematology / Oncology   Complete by: As directed    Portal vein thrombosis followup   Diet - low sodium heart healthy   Complete by: As directed    Soft diet   Diet Carb Modified   Complete by: As directed    Increase activity slowly   Complete by: As directed      Allergies as of 03/17/2020      Reactions   Ibuprofen Nausea Only, Other (See Comments)      Medication List    STOP taking these medications   aspirin 81 MG tablet   hydroxychloroquine 200 MG tablet Commonly known as: PLAQUENIL     TAKE these medications   Accu-Chek Guide test strip Generic drug: glucose blood Use to check blood sugar daily   Accu-Chek Guide w/Device Kit 1 each by Does not apply route daily. Use to check blood sugar daily   Accu-Chek Softclix Lancets lancets Use to check blood sugar daily   acetaminophen 500 MG tablet Commonly known as: TYLENOL Take 1 tablet (500 mg total) by mouth every 6 (six) hours as needed. What changed: reasons to take this  allopurinol 100 MG tablet Commonly known as: ZYLOPRIM Take 100 mg by mouth daily as needed (gout flare up).   atorvastatin 40 MG tablet Commonly known as: LIPITOR Take 1 tablet (40 mg total) by mouth daily.   carvedilol  25 MG tablet Commonly known as: COREG Take 1 tablet (25 mg total) by mouth 2 (two) times daily with a meal.   ciprofloxacin 500 MG tablet Commonly known as: Cipro Take 1 tablet (500 mg total) by mouth 2 (two) times daily for 10 days.   Entresto 49-51 MG Generic drug: sacubitril-valsartan Take 1 tablet by mouth 2 (two) times daily.   furosemide 40 MG tablet Commonly known as: LASIX Take 1 tablet (40 mg total) by mouth 2 (two) times daily.   glipiZIDE 2.5 MG 24 hr tablet Commonly known as: GLUCOTROL XL Take 1 tablet (2.5 mg total) by mouth daily with breakfast.   isosorbide mononitrate 30 MG 24 hr tablet Commonly known as: IMDUR TAKE 1 TABLET(30 MG) BY MOUTH DAILY What changed: See the new instructions.   levothyroxine 125 MCG tablet Commonly known as: Synthroid Take 1 tab by mouth every morning( on empty stomach) 30 minutes before taking other medications or before breakfast   metroNIDAZOLE 500 MG tablet Commonly known as: FLAGYL Take 1 tablet (500 mg total) by mouth 3 (three) times daily for 10 days.   omeprazole 40 MG capsule Commonly known as: PRILOSEC Take 1 capsule (40 mg total) by mouth daily as needed (acid reflux).   ondansetron 4 MG tablet Commonly known as: ZOFRAN Take 1 tablet (4 mg total) by mouth every 6 (six) hours as needed for nausea.   Rivaroxaban Stater Pack (15 mg and 20 mg) Commonly known as: XARELTO STARTER PACK Follow package directions: Take one $Remove'15mg'POKPjis$  tablet by mouth twice a day. On day 22, switch to one $Remo'20mg'vuKca$  tablet once a day. Take with food.   spironolactone 50 MG tablet Commonly known as: ALDACTONE TAKE 1 TABLET(50 MG) BY MOUTH DAILY. Please make yearly appt with Dr. Burt Knack for November before anymore refills. Thank you 1st attempt What changed:   how much to take  how to take this  when to take this  additional instructions   traMADol 50 MG tablet Commonly known as: ULTRAM Take 1 tablet (50 mg total) by mouth every 6 (six) hours as  needed for moderate pain.       Allergies  Allergen Reactions  . Ibuprofen Nausea Only and Other (See Comments)    Consultations:  Heme-onc   Procedures/Studies: CT ABDOMEN PELVIS W CONTRAST  Result Date: 03/14/2020 CLINICAL DATA:  Acute non localized abdominal pain for the past 4 days. EXAM: CT ABDOMEN AND PELVIS WITH CONTRAST TECHNIQUE: Multidetector CT imaging of the abdomen and pelvis was performed using the standard protocol following bolus administration of intravenous contrast. CONTRAST:  28mL OMNIPAQUE IOHEXOL 300 MG/ML  SOLN COMPARISON:  07/25/2019; chest radiograph-07/04/2019 FINDINGS: Lower chest: Limited visualization of the lower thorax demonstrates interstitial opacities within the imaged lung bases with associated minimal bronchiectasis but without evidence of honeycombing. Cardiomegaly. Trace amount of pericardial fluid, presumably physiologic. A defibrillator pack is imbedded within the soft tissues of the left lateral chest. Hepatobiliary: Normal hepatic contour. Redemonstrated multiple hypoattenuating nonenhancing hepatic cysts, the largest of which within the medial aspect of the lateral segment of the left lobe of the liver measuring approximately 3.8 cm in diameter (image 25, series 2). No discrete worrisome hepatic lesions. There is occlusion of the right portal vein (representative axial image  21, series 2; coronal image 74, series 5) with associated cavernous transformation and slight atrophy of the posterior segment of the right lobe of the liver in comparison to the left, new compared to the 07/05/2019 examination. The etiology of this apparent occlusion is not depicted on this examination. No ascites. Normal appearance of the gallbladder given degree distention. Pancreas: Normal appearance of the pancreas. Spleen: Normal appearance of the spleen. Adrenals/Urinary Tract: There is symmetric enhancement and excretion of the bilateral kidneys. Note is made of a macroscopic  fat containing approximately 2.3 cm angiomyolipoma involving from the inferior medial aspect the left kidney (image 37, series 2). Renal cysts are seen bilaterally with dominant right-sided renal cyst measuring 3.8 cm in diameter (image 15, series 2 and index left renal cyst measuring 3.9 cm (image 31, series 2). Additional subcentimeter hypoattenuating renal lesions are too small to accurately characterize though favored to represent additional renal cysts. No urinary obstruction or perinephric stranding. Normal appearance the bilateral adrenal glands. Normal appearance of the urinary bladder given degree distention. Stomach/Bowel: Extensive diverticulosis involving the majority of the descending and sigmoid colon. There is wall thickening and ill-defined stranding about the sigmoid colon within the left lower abdomen/pelvis (representative image 67, series 2, progressed compared to the 07/2019 examination and worrisome for an area of acute diverticulitis. There is a small amount of fluid is seen within the pelvic cul-de-sac without evidence of perforation or definable/drainable fluid collection. No evidence of perforation or definable/drainable fluid collection. Small hiatal hernia. No evidence of enteric obstruction. Normal appearance of the terminal ileum and the retrocecal appendix. No pneumoperitoneum, pneumatosis or portal venous gas. Vascular/Lymphatic: Scattered atherosclerotic plaque within normal caliber abdominal aorta. Scattered lymph nodes centered within the left hemipelvis are presumably reactive in etiology with index left common iliac chain lymph node measuring 1.1 cm in greatest short axis diameter image 54, series 2 and index left pelvic sidewall lymph node measuring 1.1 cm (image 64, series 2). Otherwise, no bulky retroperitoneal, mesenteric, pelvic or inguinal lymphadenopathy. Reproductive: Post hysterectomy.  No discrete adnexal lesion. Other: Minimal amount of subcutaneous edema about the  midline of the low back. Musculoskeletal: No acute or aggressive osseous abnormalities. Moderate DDD of L5-S1 with disc space height loss, endplate irregularity and sclerosis. Mild-to-moderate degenerative change the bilateral hips, right greater than left, with joint space loss, subchondral sclerosis and osteophytosis. IMPRESSION: 1. Findings worrisome for acute uncomplicated diverticulitis involving the sigmoid colon within the left lower abdomen/pelvis. No evidence of perforation or definable/drainable fluid collection. If not recently performed, further evaluation with colonoscopy after the resolution of acute symptoms is advised to exclude the presence of an underlying mass. 2. 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 3. Small hiatal hernia. 4. Bilateral renal cysts. 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 5.  Aortic Atherosclerosis (ICD10-I70.0). Electronically Signed   By: Sandi Mariscal M.D.   On: 03/14/2020 08:51       Subjective: Patient seen and examined at bedside. Feels much better and thinks that she will be ready to go home today. Denies worsening abdominal  pain. Tolerating diet. No overnight fever or vomiting reported.  Discharge Exam: Vitals:   03/16/20 2146 03/17/20 0542  BP: (!) 121/58 (!) 152/74  Pulse: 60 64  Resp: 18 16  Temp:  97.9 F (36.6 C)  SpO2: 98% 97%    General: Pt is alert, awake, not in acute distress Cardiovascular: rate controlled, S1/S2 + Respiratory:  bilateral decreased breath sounds at bases Abdominal: Soft, only very mildly tender in the left lower quadrant, ND, bowel sounds + Extremities: Trace lower extremity edema present; no cyanosis    The results of significant diagnostics from this hospitalization (including imaging, microbiology, ancillary and laboratory) are listed below for reference.     Microbiology: Recent Results (from the past 240 hour(s))  Urine culture     Status: Abnormal   Collection Time: 03/14/20 10:25 AM   Specimen: Urine, Random  Result Value Ref Range Status   Specimen Description   Final    URINE, RANDOM Performed at [facility] [address] [place], [place] [address]    Special Requests   Final    NONE Performed at [facility], [place] [address] [place], [place] [address]    Culture >=100,000 COLONIES/mL ESCHERICHIA COLI (A)  Final   Report Status 03/16/2020 FINAL  Final   Organism ID, Bacteria ESCHERICHIA COLI (A)  Final      Susceptibility   Escherichia coli - MIC*    AMPICILLIN >=32 RESISTANT Resistant     CEFAZOLIN 16 SENSITIVE Sensitive     CEFEPIME <=0.12 SENSITIVE Sensitive     CEFTRIAXONE 0.5 SENSITIVE Sensitive     CIPROFLOXACIN <=0.25 SENSITIVE Sensitive     GENTAMICIN <=1 SENSITIVE Sensitive     IMIPENEM <=0.25 SENSITIVE Sensitive     NITROFURANTOIN <=16 SENSITIVE Sensitive     TRIMETH/SULFA <=20 SENSITIVE Sensitive     AMPICILLIN/SULBACTAM >=32 RESISTANT Resistant     PIP/TAZO <=4 SENSITIVE Sensitive     * >=100,000 COLONIES/mL ESCHERICHIA COLI  Resp Panel by RT-PCR (Flu A&B, Covid) Nasopharyngeal Swab     Status: None   Collection Time: 03/14/20 10:37 AM   Specimen: Nasopharyngeal Swab; Nasopharyngeal(NP) swabs in vial transport medium  Result Value Ref Range Status   SARS Coronavirus 2 by RT PCR NEGATIVE NEGATIVE Final    Comment: (NOTE) SARS-CoV-2 target nucleic acids are NOT DETECTED.  The SARS-CoV-2 RNA is generally detectable in  upper respiratory specimens during the acute phase of infection. The lowest concentration of SARS-CoV-2 viral copies this assay can detect is 138 copies/mL. A negative result does not preclude SARS-Cov-2 infection and should not be used as the sole basis for treatment or other patient management decisions. A negative result may occur with  improper specimen collection/handling, submission of specimen other than nasopharyngeal swab, presence of viral mutation(s) within the areas targeted by this assay, and inadequate number of viral copies(<138 copies/mL). A negative result must be combined with clinical observations, patient history, and epidemiological information. The expected result is Negative.  Fact Sheet for Patients:  EntrepreneurPulse.com.au  Fact Sheet for Healthcare Providers:  IncredibleEmployment.be  This test is no t yet approved or cleared by the [place] FDA and  has been authorized for detection and/or diagnosis of SARS-CoV-2 by FDA under an Emergency Use Authorization (EUA). This EUA will remain  in effect (meaning this test can be used) for the duration of the COVID-19 declaration under Section 564(b)(1) of the Act, 21 U.S.C.section 360bbb-3(b)(1), unless the authorization is terminated  or revoked sooner.  Influenza A by PCR NEGATIVE NEGATIVE Final   Influenza B by PCR NEGATIVE NEGATIVE Final    Comment: (NOTE) The Xpert Xpress SARS-CoV-2/FLU/RSV plus assay is intended as an aid in the diagnosis of influenza from Nasopharyngeal swab specimens and should not be used as a sole basis for treatment. Nasal washings and aspirates are unacceptable for Xpert Xpress SARS-CoV-2/FLU/RSV testing.  Fact Sheet for Patients: EntrepreneurPulse.com.au  Fact Sheet for Healthcare Providers: IncredibleEmployment.be  This test is not yet approved or cleared by the [place] FDA and has been  authorized for detection and/or diagnosis of SARS-CoV-2 by FDA under an Emergency Use Authorization (EUA). This EUA will remain in effect (meaning this test can be used) for the duration of the COVID-19 declaration under Section 564(b)(1) of the Act, 21 U.S.C. section 360bbb-3(b)(1), unless the authorization is terminated or revoked.  Performed at [facility], [place] [address], [place], [place] [address]      Labs: BNP (last 3 results) No results for input(s): BNP in the last 8760 hours. Basic Metabolic Panel: Recent Labs  Lab 03/13/20 2218 03/15/20 0058 03/16/20 0731 03/17/20 0724  NA 138 138 139 139  K 3.9 3.3* 3.5 3.6  CL 108 107 105 101  CO2 21* 24 23 25   GLUCOSE 124* 126* 130* 101*  BUN 11 11 12 14   CREATININE 1.34* 1.51* 1.38* 1.55*  CALCIUM 8.9 8.5* 8.5* 8.7*  MG  --   --  2.1 2.0   Liver Function Tests: Recent Labs  Lab 03/13/20 2218 03/15/20 0058  AST 15 11*  ALT 13 11  ALKPHOS 103 80  BILITOT 0.8 0.5  PROT 7.3 6.4*  ALBUMIN 3.1* 2.8*   Recent Labs  Lab 03/13/20 2218  LIPASE 17   No results for input(s): AMMONIA in the last 168 hours. CBC: Recent Labs  Lab 03/13/20 2218 03/15/20 0058 03/16/20 0731 03/17/20 0724  WBC 10.7* 8.9 7.0 7.1  NEUTROABS  --   --  3.9 4.3  HGB 12.9 10.8* 11.7* 12.3  HCT 43.2 37.8 38.1 39.8  MCV 95.2 101.1* 94.8 93.9  PLT 232 197 236 251   Cardiac Enzymes: No results for input(s): CKTOTAL, CKMB, CKMBINDEX, TROPONINI in the last 168 hours. BNP: Invalid input(s): POCBNP CBG: Recent Labs  Lab 03/16/20 0726 03/16/20 1216 03/16/20 1749 03/16/20 2150 03/17/20 0800  GLUCAP 124* 112* 77 128* 144*   D-Dimer No results for input(s): DDIMER in the last 72 hours. Hgb A1c Recent Labs    03/14/20 1658  HGBA1C 6.0*   Lipid Profile No results for input(s): CHOL, HDL, LDLCALC, TRIG, CHOLHDL, LDLDIRECT in the last 72 hours. Thyroid function studies No results for input(s): TSH, T4TOTAL, T3FREE,  THYROIDAB in the last 72 hours.  Invalid input(s): FREET3 Anemia work up No results for input(s): VITAMINB12, FOLATE, FERRITIN, TIBC, IRON, RETICCTPCT in the last 72 hours. Urinalysis    Component Value Date/Time   COLORURINE AMBER (A) 03/13/2020 2212   APPEARANCEUR CLOUDY (A) 03/13/2020 2212   LABSPEC 1.024 03/13/2020 2212   PHURINE 5.0 03/13/2020 2212   GLUCOSEU NEGATIVE 03/13/2020 2212   HGBUR NEGATIVE 03/13/2020 2212   BILIRUBINUR NEGATIVE 03/13/2020 2212   BILIRUBINUR neg 07/14/2019 0936   KETONESUR NEGATIVE 03/13/2020 2212   PROTEINUR >=300 (A) 03/13/2020 2212   UROBILINOGEN 0.2 07/14/2019 0936   UROBILINOGEN 0.2 01/28/2007 1647   NITRITE POSITIVE (A) 03/13/2020 2212   LEUKOCYTESUR TRACE (A) 03/13/2020 2212   Sepsis Labs Invalid input(s): PROCALCITONIN,  WBC,  LACTICIDVEN Microbiology Recent  Results (from the past 240 hour(s))  Urine culture     Status: Abnormal   Collection Time: 03/14/20 10:25 AM   Specimen: Urine, Random  Result Value Ref Range Status   Specimen Description   Final    URINE, RANDOM Performed at [facility], [place] [address], [place], [place] [address]    Special Requests   Final    NONE Performed at [facility], [place] [address], [place], [place] [address]    Culture >=100,000 COLONIES/mL ESCHERICHIA COLI (A)  Final   Report Status 03/16/2020 FINAL  Final   Organism ID, Bacteria ESCHERICHIA COLI (A)  Final      Susceptibility   Escherichia coli - MIC*    AMPICILLIN >=32 RESISTANT Resistant     CEFAZOLIN 16 SENSITIVE Sensitive     CEFEPIME <=0.12 SENSITIVE Sensitive     CEFTRIAXONE 0.5 SENSITIVE Sensitive     CIPROFLOXACIN <=0.25 SENSITIVE Sensitive     GENTAMICIN <=1 SENSITIVE Sensitive     IMIPENEM <=0.25 SENSITIVE Sensitive     NITROFURANTOIN <=16 SENSITIVE Sensitive     TRIMETH/SULFA <=20 SENSITIVE Sensitive     AMPICILLIN/SULBACTAM >=32 RESISTANT Resistant     PIP/TAZO <=4 SENSITIVE Sensitive      * >=100,000 COLONIES/mL ESCHERICHIA COLI  Resp Panel by RT-PCR (Flu A&B, Covid) Nasopharyngeal Swab     Status: None   Collection Time: 03/14/20 10:37 AM   Specimen: Nasopharyngeal Swab; Nasopharyngeal(NP) swabs in vial transport medium  Result Value Ref Range Status   SARS Coronavirus 2 by RT PCR NEGATIVE NEGATIVE Final    Comment: (NOTE) SARS-CoV-2 target nucleic acids are NOT DETECTED.  The SARS-CoV-2 RNA is generally detectable in upper respiratory specimens during the acute phase of infection. The lowest concentration of SARS-CoV-2 viral copies this assay can detect is 138 copies/mL. A negative result does not preclude SARS-Cov-2 infection and should not be used as the sole basis for treatment or other patient management decisions. A negative result may occur with  improper specimen collection/handling, submission of specimen other than nasopharyngeal swab, presence of viral mutation(s) within the areas targeted by this assay, and inadequate number of viral copies(<138 copies/mL). A negative result must be combined with clinical observations, patient history, and epidemiological information. The expected result is Negative.  Fact Sheet for Patients:  EntrepreneurPulse.com.au  Fact Sheet for Healthcare Providers:  IncredibleEmployment.be  This test is no t yet approved or cleared by the [place] FDA and  has been authorized for detection and/or diagnosis of SARS-CoV-2 by FDA under an Emergency Use Authorization (EUA). This EUA will remain  in effect (meaning this test can be used) for the duration of the COVID-19 declaration under Section 564(b)(1) of the Act, 21 U.S.C.section 360bbb-3(b)(1), unless the authorization is terminated  or revoked sooner.       Influenza A by PCR NEGATIVE NEGATIVE Final   Influenza B by PCR NEGATIVE NEGATIVE Final    Comment: (NOTE) The Xpert Xpress SARS-CoV-2/FLU/RSV plus assay is intended as an aid in  the diagnosis of influenza from Nasopharyngeal swab specimens and should not be used as a sole basis for treatment. Nasal washings and aspirates are unacceptable for Xpert Xpress SARS-CoV-2/FLU/RSV testing.  Fact Sheet for Patients: EntrepreneurPulse.com.au  Fact Sheet for Healthcare Providers: IncredibleEmployment.be  This test is not yet approved or cleared by the [place] FDA and has been authorized for detection and/or diagnosis of SARS-CoV-2 by FDA under an Emergency Use Authorization (EUA). This EUA will remain in  effect (meaning this test can be used) for the duration of the COVID-19 declaration under Section 564(b)(1) of the Act, 21 U.S.C. section 360bbb-3(b)(1), unless the authorization is terminated or revoked.  Performed at [facility], [place] [address], [place], [place] [address]      Time coordinating discharge: 35 minutes  SIGNED:   Aline August, MD  [facility] 03/17/2020, 11:31 AM

## 2020-03-17 NOTE — Telephone Encounter (Signed)
Transition Care Management Unsuccessful Follow-up Telephone Call  Date of discharge and from where:  03/17/2020 from [facility]   Attempts:  1st Attempt  Reason for unsuccessful TCM follow-up call:  Left voice message

## 2020-03-18 LAB — PROTHROMBIN GENE MUTATION

## 2020-03-18 NOTE — Telephone Encounter (Signed)
Transition Care Management Unsuccessful Follow-up Telephone Call  Date of discharge and from where:  03/17/2020 from [facility]   Attempts:  2nd Attempt  Reason for unsuccessful TCM follow-up call:  Left voice message

## 2020-03-19 LAB — JAK2 GENOTYPR

## 2020-03-19 NOTE — Telephone Encounter (Signed)
Closing phone note 

## 2020-03-31 ENCOUNTER — Other Ambulatory Visit: Payer: Self-pay

## 2020-03-31 ENCOUNTER — Ambulatory Visit: Payer: Medicare PPO | Admitting: Family Medicine

## 2020-03-31 ENCOUNTER — Encounter: Payer: Self-pay | Admitting: Family Medicine

## 2020-03-31 VITALS — BP 160/98 | HR 79 | Temp 98.1°F | Wt 209.0 lb

## 2020-03-31 DIAGNOSIS — M1A0790 Idiopathic chronic gout, unspecified ankle and foot, without tophus (tophi): Secondary | ICD-10-CM

## 2020-03-31 DIAGNOSIS — I5042 Chronic combined systolic (congestive) and diastolic (congestive) heart failure: Secondary | ICD-10-CM | POA: Diagnosis not present

## 2020-03-31 DIAGNOSIS — E1142 Type 2 diabetes mellitus with diabetic polyneuropathy: Secondary | ICD-10-CM

## 2020-03-31 DIAGNOSIS — I10 Essential (primary) hypertension: Secondary | ICD-10-CM

## 2020-03-31 DIAGNOSIS — I428 Other cardiomyopathies: Secondary | ICD-10-CM

## 2020-03-31 DIAGNOSIS — Z09 Encounter for follow-up examination after completed treatment for conditions other than malignant neoplasm: Secondary | ICD-10-CM | POA: Diagnosis not present

## 2020-03-31 DIAGNOSIS — E039 Hypothyroidism, unspecified: Secondary | ICD-10-CM

## 2020-03-31 DIAGNOSIS — K5732 Diverticulitis of large intestine without perforation or abscess without bleeding: Secondary | ICD-10-CM | POA: Diagnosis not present

## 2020-03-31 DIAGNOSIS — E890 Postprocedural hypothyroidism: Secondary | ICD-10-CM | POA: Diagnosis not present

## 2020-03-31 DIAGNOSIS — E782 Mixed hyperlipidemia: Secondary | ICD-10-CM | POA: Diagnosis not present

## 2020-03-31 DIAGNOSIS — I81 Portal vein thrombosis: Secondary | ICD-10-CM | POA: Diagnosis not present

## 2020-03-31 DIAGNOSIS — Z95810 Presence of automatic (implantable) cardiac defibrillator: Secondary | ICD-10-CM

## 2020-03-31 LAB — CBC WITH DIFFERENTIAL/PLATELET
Basophils Absolute: 0 10*3/uL (ref 0.0–0.1)
Basophils Relative: 0.8 % (ref 0.0–3.0)
Eosinophils Absolute: 0.1 10*3/uL (ref 0.0–0.7)
Eosinophils Relative: 2.1 % (ref 0.0–5.0)
HCT: 41.2 % (ref 36.0–46.0)
Hemoglobin: 13.4 g/dL (ref 12.0–15.0)
Lymphocytes Relative: 37.7 % (ref 12.0–46.0)
Lymphs Abs: 2 10*3/uL (ref 0.7–4.0)
MCHC: 32.4 g/dL (ref 30.0–36.0)
MCV: 90.9 fl (ref 78.0–100.0)
Monocytes Absolute: 0.5 10*3/uL (ref 0.1–1.0)
Monocytes Relative: 8.8 % (ref 3.0–12.0)
Neutro Abs: 2.7 10*3/uL (ref 1.4–7.7)
Neutrophils Relative %: 50.6 % (ref 43.0–77.0)
Platelets: 299 10*3/uL (ref 150.0–400.0)
RBC: 4.53 Mil/uL (ref 3.87–5.11)
RDW: 15.4 % (ref 11.5–15.5)
WBC: 5.3 10*3/uL (ref 4.0–10.5)

## 2020-03-31 LAB — TSH: TSH: 5.23 u[IU]/mL — ABNORMAL HIGH (ref 0.35–4.50)

## 2020-03-31 LAB — BASIC METABOLIC PANEL WITH GFR
BUN/Creatinine Ratio: 15 (calc) (ref 6–22)
BUN: 22 mg/dL (ref 7–25)
CO2: 23 mmol/L (ref 20–32)
Calcium: 9.7 mg/dL (ref 8.6–10.4)
Chloride: 108 mmol/L (ref 98–110)
Creat: 1.46 mg/dL — ABNORMAL HIGH (ref 0.60–0.93)
GFR, Est African American: 40 mL/min/{1.73_m2} — ABNORMAL LOW (ref >=60)
GFR, Est Non African American: 35 mL/min/{1.73_m2} — ABNORMAL LOW (ref >=60)
Glucose, Bld: 107 mg/dL — ABNORMAL HIGH (ref 65–99)
Potassium: 4.9 mmol/L (ref 3.5–5.3)
Sodium: 140 mmol/L (ref 135–146)

## 2020-03-31 MED ORDER — ALLOPURINOL 100 MG PO TABS: 100.0000 mg | ORAL_TABLET | Freq: Every day | ORAL | 3 refills | Status: DC

## 2020-03-31 NOTE — Addendum Note (Signed)
Addended by: Tessie Fass D on: 03/31/2020 09:19 AM   Modules accepted: Orders

## 2020-03-31 NOTE — Patient Instructions (Signed)
Diverticulitis  Diverticulitis is infection or inflammation of small pouches (diverticula) in the colon that form due to a condition called diverticulosis. Diverticula can trap stool (feces) and bacteria, causing infection and inflammation. Diverticulitis may cause severe stomach pain and diarrhea. It may lead to tissue damage in the colon that causes bleeding. The diverticula may also burst (rupture) and cause infected stool to enter other areas of the abdomen. Complications of diverticulitis can include:  Bleeding.  Severe infection.  Severe pain.  Rupture (perforation) of the colon.  Blockage (obstruction) of the colon. What are the causes? This condition is caused by stool becoming trapped in the diverticula, which allows bacteria to grow in the diverticula. This leads to inflammation and infection. What increases the risk? You are more likely to develop this condition if:  You have diverticulosis. The risk for diverticulosis increases if: ? You are overweight or obese. ? You use tobacco products. ? You do not get enough exercise.  You eat a diet that does not include enough fiber. High-fiber foods include fruits, vegetables, beans, nuts, and whole grains. What are the signs or symptoms? Symptoms of this condition may include:  Pain and tenderness in the abdomen. The pain is normally located on the left side of the abdomen, but it may occur in other areas.  Fever and chills.  Bloating.  Cramping.  Nausea.  Vomiting.  Changes in bowel routines.  Blood in your stool. How is this diagnosed? This condition is diagnosed based on:  Your medical history.  A physical exam.  Tests to make sure there is nothing else causing your condition. These tests may include: ? Blood tests. ? Urine tests. ? Imaging tests of the abdomen, including X-rays, ultrasounds, MRIs, or CT scans. How is this treated? Most cases of this condition are mild and can be treated at home.  Treatment may include:  Taking over-the-counter pain medicines.  Following a clear liquid diet.  Taking antibiotic medicines by mouth.  Rest. More severe cases may need to be treated at a hospital. Treatment may include:  Not eating or drinking.  Taking prescription pain medicine.  Receiving antibiotic medicines through an IV tube.  Receiving fluids and nutrition through an IV tube.  Surgery. When your condition is under control, your health care provider may recommend that you have a colonoscopy. This is an exam to look at the entire large intestine. During the exam, a lubricated, bendable tube is inserted into the anus and then passed into the rectum, colon, and other parts of the large intestine. A colonoscopy can show how severe your diverticula are and whether something else may be causing your symptoms. Follow these instructions at home: Medicines  Take over-the-counter and prescription medicines only as told by your health care provider. These include fiber supplements, probiotics, and stool softeners.  If you were prescribed an antibiotic medicine, take it as told by your health care provider. Do not stop taking the antibiotic even if you start to feel better.  Do not drive or use heavy machinery while taking prescription pain medicine. General instructions   Follow a full liquid diet or another diet as directed by your health care provider. After your symptoms improve, your health care provider may tell you to change your diet. He or she may recommend that you eat a diet that contains at least 25 g (25 grams) of fiber daily. Fiber makes it easier to pass stool. Healthy sources of fiber include: ? Berries. One cup contains 4-8 grams of   fiber. ? Beans or lentils. One half cup contains 5-8 grams of fiber. ? Green vegetables. One cup contains 4 grams of fiber.  Exercise for at least 30 minutes, 3 times each week. You should exercise hard enough to raise your heart rate and  break a sweat.  Keep all follow-up visits as told by your health care provider. This is important. You may need a colonoscopy. Contact a health care provider if:  Your pain does not improve.  You have a hard time drinking or eating food.  Your bowel movements do not return to normal. Get help right away if:  Your pain gets worse.  Your symptoms do not get better with treatment.  Your symptoms suddenly get worse.  You have a fever.  You vomit more than one time.  You have stools that are bloody, black, or tarry. Summary  Diverticulitis is infection or inflammation of small pouches (diverticula) in the colon that form due to a condition called diverticulosis. Diverticula can trap stool (feces) and bacteria, causing infection and inflammation.  You are at higher risk for this condition if you have diverticulosis and you eat a diet that does not include enough fiber.  Most cases of this condition are mild and can be treated at home. More severe cases may need to be treated at a hospital.  When your condition is under control, your health care provider may recommend that you have an exam called a colonoscopy. This exam can show how severe your diverticula are and whether something else may be causing your symptoms. This information is not intended to replace advice given to you by your health care provider. Make sure you discuss any questions you have with your health care provider. Document Revised: 03/02/2017 Document Reviewed: 04/22/2016 Elsevier Patient Education  2020 [place].  Portal Vein Thrombosis  Portal vein thrombosis (PVT) is a blockage from a blood clot in the vein that carries blood from the intestines to the liver (portal vein). PVT can also develop in the branches of the portal vein. The clot may form quickly or develop over time. PVT may slow down or completely stop blood flow. PVT is treatable, but it can be life-threatening. What are the causes? PVT is  caused by a blood clot. In many cases, the cause of the clot is not known. What increases the risk? You are at risk of PVT if you have any condition that slows down blood flow or increases the clotting of blood. These conditions include:  Scarring of the liver (cirrhosis).  Cancer, especially of the liver or pancreas.  Infections of the pancreas or gallbladder.  Blood-clotting disorders.  Surgery or injury of the abdomen. What are the signs or symptoms? PVT often does not cause signs or symptoms. In some cases, you may have gastrointestinal (GI) bleeding from a backup of blood flow because of the blockage. This is caused by the veins that have become wide (dilated). Other signs and symptoms of PVT may include:  Pain in the abdomen.  Nausea or vomiting.  Swelling of the abdomen from too much fluid (ascites).  Fever.  Enlarged spleen.  Gastrointestinal (GI) bleeding from swollen blood vessels in the esophagus or stomach. If this happens, you may: ? Vomit blood. ? Have bloody diarrhea. ? Have black, tarry stools. How is this diagnosed? This condition may be diagnosed based on your symptoms and risk factors. Your health care provider will do:  A physical exam.  Imaging studies of your abdomen. These may  include ultrasound, CT scan, or MRI.  Tests to check for liver function or infection. These are also done to confirm the diagnosis. How is this treated? Treatment of PVT depends on the cause and severity of your condition. It may also include treatment for any underlying conditions. This condition may be treated with:  Medicines to: ? Break up a blood clot. ? Prevent clotting (anticoagulants). You may first get anticoagulant injections and then continue with anticoagulant pills for several months. ? Lower your blood pressure. ? Improve blood flow to your liver (octreotide).  Surgery to: ? Stop bleeding in the stomach or esophagus. This procedure is usually done using a  scope passed through your mouth (endoscopic surgery). ? Restore blood flow through the blood clot, or around it (shunt surgery).  An organ transplant to replace the damaged liver with a healthy liver from a donor. This is done in very severe cases of liver damage. Follow these instructions at home: Medicines  Take over-the-counter and prescription medicines only as told by your health care provider.  If you were prescribed an antibiotic medicine, take it as told by your health care provider. Do not stop using the antibiotic even if you start to feel better. Blood Thinners If you are taking blood thinners:  Talk with your health care provider before you take any medicines that contain aspirin or NSAIDs. These medicines increase your risk for dangerous bleeding.  Get approval from your health care provider before you start taking any new medicines, vitamins, or herbal products. Some of these could interfere with your therapy.  Take your medicine exactly as told, at the same time every day.  Avoid activities that could cause injury or bruising, and follow instructions about how to prevent falls.  Wear a medical alert bracelet or carry a card that lists what medicines you take. Your health care provider may ask you to:  Have blood tests done regularly so that your medicines may be changed if needed.  Limit foods that have vitamin K. Vitamin K affects how some blood thinners work in the body. ? Some common foods that contain high amounts of vitamin K include kale, spinach, and broccoli. ? Work with a diet and nutrition specialist (dietitian) to make an eating plan that is right for you. Lifestyle   Eat foods that are high in fiber, such as fresh fruits and vegetables, whole grains, and beans.  Limit foods that are high in fat and processed sugars, such as fried and sweet foods.  Do not use any products that contain nicotine or tobacco, such as cigarettes, e-cigarettes, and chewing  tobacco. If you need help quitting, ask your health care provider.  Do not drink alcohol. Alcohol can damage your liver.  Ask your health care provider if you have other fluid or diet restrictions. General instructions  Return to your normal activities as told by your health care provider. Ask your health care provider what activities are safe for you.  Keep all follow-up visits as told by your health care provider. This is important.  You may be asked to have regular blood tests if you are taking blood thinners. Contact a health care provider if:  You have chills or a fever.  You have any signs or symptoms that get worse or come back.  There is blood in your stool.  You have black, tarry stools. Get help right away if:  You vomit blood.  You have fresh blood or blood clots in your stool. Summary  Portal vein thrombosis (PVT) is a blockage from a blood clot in the vein that carries blood from your intestines to your liver (portal vein). Any condition that slows down blood flow or increases blood clotting can cause PVT.  PVT often does not cause signs or symptoms but may cause gastrointestinal bleeding, which may cause a backup of blood.  Sometimes, PVT can cause swelling in the abdomen from fluid buildup (ascites), pain in the abdomen, and bleeding from the esophagus or stomach.  Treatment of PVT depends on the cause and severity of your condition. Treatment may include medicines and sometimes surgery.  You may be asked to limit your intake of foods that have vitamin K. Vitamin K may affect the way blood thinners work in the body. This information is not intended to replace advice given to you by your health care provider. Make sure you discuss any questions you have with your health care provider. Document Revised: 04/10/2018 Document Reviewed: 04/05/2018 Elsevier Patient Education  [place].

## 2020-03-31 NOTE — Progress Notes (Signed)
Subjective:    Patient ID: Paula Stein, female    DOB: 1945-04-09, 74 y.o.   MRN: [id number]  No chief complaint on file.   HPI Pt is a 74 yo female with pmh sig for HTN, atherosclerosis, NCIM with AICD, combined chronic CHF, CAD, PAD, h/o portal vein thrombosis, OSA, GERD, diverticulitis, surgical hypothyroidism, HLD, DM 2 tobacco use who was seen for HFU.  Pt admitted 12/12-12/15/201 for LLQ pain.  CT abd/pelvis with acute uncomplicated sigmoid diverticulitis and thrombus in R portal vein.  Pt started on Zosyn and heparin gtt.  Oncology consulted.  Pt switched to Xarelto.  D/c'd on po flagyl and cipro x 10 days.  Pt states she has been doing well since being home.  She denies abd pain, blood in stools, fever, chills.  Completed abxs yesterday.  Has not yet taken am meds.  Denies LE edema, CP, SOB.  Got a pill organizer which is helpful.  Needs refill on allopurinol, out x 3 months, endorses gout flares off and on affecting feet.  Past Medical History:  Diagnosis Date  . AICD (automatic cardioverter/defibrillator) present 10/08/2014   SUBQ    /    DR [name]  . CHF (congestive heart failure) ([place])   . Diabetes mellitus without complication ([place])   . History of cardiac cath 05/2007   normal-with patent coronaries  . History of colonoscopy   . History of hiatal hernia   . Hypertension   . Iatrogenic thyroiditis   . Non-ischemic cardiomyopathy ([place])    EF 28%- reassessment of LV function 2011 with LVEf 45-50%  . PAD (peripheral artery disease) ([facility])    lower extremities with ABIs of 0.5 bilaterally  . Personal history of goiter   . S/P radioactive iodine thyroid ablation   . S/P thyroidectomy   . Shortness of breath dyspnea     Allergies  Allergen Reactions  . Ibuprofen Nausea Only and Other (See Comments)    ROS General: Denies fever, chills, night sweats, changes in weight, changes in appetite HEENT: Denies headaches, ear pain, changes in vision, rhinorrhea, sore throat CV:  Denies CP, palpitations, SOB, orthopnea Pulm: Denies SOB, cough, wheezing GI: Denies abdominal pain, nausea, vomiting, diarrhea, constipation GU: Denies dysuria, hematuria, frequency, vaginal discharge Msk: Denies muscle cramps, joint pains  +gout Neuro: Denies weakness, numbness, tingling Skin: Denies rashes, bruising Psych: Denies depression, anxiety, hallucinations     Objective:    Blood pressure (!) 160/98, pulse 79, temperature 98.1 F (36.7 C), temperature source Oral, weight 209 lb (94.8 kg), SpO2 99 %.  Recheck 159/79   Gen. Pleasant, well-nourished, in no distress, normal affect   HEENT: [place]/AT, face symmetric, conjunctiva clear, no scleral icterus, PERRLA, EOMI, nares patent without drainage Lungs: no accessory muscle use, CTAB, no wheezes or rales Cardiovascular: RRR, no m/r/g, no peripheral edema Abdomen: BS present, soft, NT/ND, no hepatosplenomegaly. Musculoskeletal: No deformities, no cyanosis or clubbing, normal tone Neuro:  A&Ox3, CN II-XII intact, normal gait Skin:  Warm, no lesions/ rash   Wt Readings from Last 3 Encounters:  03/31/20 209 lb (94.8 kg)  03/15/20 209 lb 11.2 oz (95.1 kg)  03/13/20 210 lb (95.3 kg)    Lab Results  Component Value Date   WBC 7.1 03/17/2020   HGB 12.3 03/17/2020   HCT 39.8 03/17/2020   PLT 251 03/17/2020   GLUCOSE 101 (H) 03/17/2020   CHOL 228 (H) 02/06/2019   TRIG 165 (H) 02/06/2019   HDL 91 02/06/2019   LDLDIRECT 195.2  02/15/2011   LDLCALC 109 (H) 02/06/2019   ALT 11 03/15/2020   AST 11 (L) 03/15/2020   NA 139 03/17/2020   K 3.6 03/17/2020   CL 101 03/17/2020   CREATININE 1.55 (H) 03/17/2020   BUN 14 03/17/2020   CO2 25 03/17/2020   TSH 0.01 (L) 11/24/2019   INR 0.9 10/01/2014   HGBA1C 6.0 (H) 03/14/2020   MICROALBUR 33.5 (H) 06/02/2015    Assessment/Plan:  Hospital discharge follow-up -Hospitalized 12/12-12/15/2021 -Notes and labs reviewed from hospitalization -TCM phone call reviewed  Portal vein  thrombosis  -Portal vein thrombus noted on CT abdomen/pelvis -Continue Xarelto x 6 months -Continue follow-up with hematology - Plan: CBC with Differential/Platelet  Sigmoid diverticulitis -Stable -Completed Cipro and Flagyl x10 days -Continue to monitor for abdominal pain and other symptoms -Follow-up with GI for possible colonoscopy  Essential hypertension -Elevated -Recheck 159/79 this provider -Pt has not taken a.m. meds which may be contributing to current elevation -Continue current medications including Coreg 25 mg twice daily, Imdur 30 mg daily, Lasix 40 mg twice daily, Entresto 49-51 mg every, spironolactone 50 mg daily -Continue follow-up with cardiology - Plan: BASIC METABOLIC PANEL WITH GFR  Mixed hyperlipidemia -Discussed rechecking lipid panel in the next few months -Total cholesterol 228, HDL 91, LDL 109, triglycerides 165 on 02/06/2019 -Continue lifestyle modifications -Continue Lipitor 40 mg daily  Postoperative hypothyroidism -Continue Synthroid 125 mcg daily -Discussed rechecking TSH.  Pt has upcoming labs this week  Chronic combined systolic and diastolic CHF (congestive heart failure) ([facility]) -stable -Continue current meds including Coreg 25 mg twice daily, Imdur 30 mg daily, Lasix 40 mg twice daily, Entresto 49-51 mg twice daily spironolactone 50 mg daily -Continue lifestyle modifications -Continue follow-up with cardiology  Type 2 diabetes mellitus with diabetic polyneuropathy, without long-term current use of insulin ([facility]) -Stable -Hemoglobin A1c 6.0% on 03/14/2020 -Continue lifestyle modifications -Continue current medications including glipizide XL 2.5 mg daily -Obtain foot exam at next [facility] -Diabetic retinopathy screening recommended  NICM (nonischemic cardiomyopathy) ([place])  -Stable -ICD in place -Continue follow-up with cardiology -Continue current medications  ICD (implantable cardioverter-defibrillator) in place -Stable -Continue follow-up  with cardiology  Chronic gout of foot, unspecified cause, unspecified laterality -Discussed lifestyle modifications and avoiding foods known to cause flares -Continue allopurinol daily - Plan: allopurinol (ZYLOPRIM) 100 MG tablet  F/u in 3 months, sooner if needed.  Grier Mitts, MD

## 2020-04-12 NOTE — Progress Notes (Signed)
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  As discussed at that time with the patient, that clot was not new, since there was evidence of cavernous transformation on the scans.  We discussed the possible causes of this and we specifically checked for JAK2, factor V Leiden and prothrombin gene mutations.  We did not send a probe for PNH in the absence of any evidence of hemolysis.   She is here today to discuss those results.   REVIEW OF SYSTEMS: Paula Stein was transitioned to rivaroxaban at the time of discharge 03/17/2020.  She did not have to pay for that prescription so she does not know how much she will need to pay monthly for this drug.  She is tolerating it with absolutely no side effects and specifically no evidence of bleeding or bruising.  For exercise she enjoys walking both outside and on a treadmill.  She occasionally goes fishing on some ponds near the house.  She denies any gait imbalance or falls or any other recent trauma.  Aside from these issues a detailed review of system today stable   COVID 19 VACCINATION STATUS: Status post Moderna x2 with booster October 2021    HISTORY OF CURRENT ILLNESS:  From the original consult note:  Paula Stein is a 75 year old female from [place], [place]. She has a past medical history significant for diabetes mellitus type 2,  hypertension, hypothyroidism, CKD, nonischemic cardiac myopathy s/p ICD implantation.  Patient presented to the hospital with a 4-day history of abdominal pain.  Pain was sharp and in her lower abdomen.  It caused her to lose control of her bowels initially and then was associated with nausea and vomiting.  CT abdomen/pelvis showed portal vein thrombosis and diverticulitis.  Additionally, UA was concerning for UTI.  She has been started on a heparin drip.   Hematology was asked to see the patient to make recommendations regarding her PVT--see the consult note from 03/15/2020.  Multiple labs were obtained at that time.   This included a JAK2 genotype, the prothrombin gene mutation, and the factor V Leiden mutation.  None of those mutations were present.  The patient's subsequent history is as detailed below.     PAST MEDICAL HISTORY: Past Medical History:  Diagnosis Date  . AICD (automatic cardioverter/defibrillator) present 10/08/2014   SUBQ    /    DR [name]  . CHF (congestive heart failure) ([place])   . Diabetes mellitus without complication ([facility] [facility])   . History of cardiac cath 05/2007   normal-with patent coronaries  . History of colonoscopy   . History of hiatal hernia   . Hypertension   . Iatrogenic thyroiditis   . Non-ischemic cardiomyopathy ([facility])    EF 28%- reassessment of LV function 2011 with LVEf 45-50%  . PAD (peripheral artery disease) ([facility])    lower extremities with ABIs of 0.5 bilaterally  . Personal history of goiter   . S/P radioactive iodine thyroid  ablation   . S/P thyroidectomy   . Shortness of breath dyspnea     PAST SURGICAL HISTORY: Past Surgical History:  Procedure Laterality Date  . EP IMPLANTABLE DEVICE N/A 10/08/2014   Procedure: SubQ ICD Implant;  Surgeon: [name], MD;  Location: [facility];  Service: Cardiovascular;  Laterality: N/A;  . SUBQ ICD CHANGEOUT N/A 11/14/2019   Procedure: ZOXW ICD CHANGEOUT;  Surgeon: [name], MD;  Location: [facility];  Service: Cardiovascular;  Laterality: N/A;  . THYROIDECTOMY    . TONSILLECTOMY    . TOTAL ABDOMINAL HYSTERECTOMY      FAMILY HISTORY: Family History  Problem Relation Age of Onset  . Diabetes type II Mother   . Hypertension Mother   . Diabetes Mother   . Heart disease Mother   . Other Father        deceased from accident age 53  . Hyperlipidemia Father   . Breast cancer Neg Hx   . Stomach cancer Neg Hx   . Colon cancer Neg Hx   . Esophageal cancer Neg Hx   . Pancreatic cancer Neg Hx    The patient's father died at the age of 82 after hit-and-run accident.  The patient's mother died at the age of 27 from complications of diabetes.  The patient reports that she had a maternal aunt who was on Coumadin.  She thinks that she was on Coumadin for a blood clot.  Denies any other new or family history of blood clots.  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 she is divorced. [place] son Paula Stein, as well as his wife Paula Stein and their 47 year old daughter.    ADVANCED DIRECTIVES: Has not completed a living will or healthcare power of attorney but reports that her son, Paula Stein, should make decisions for her if she cannot.   HEALTH MAINTENANCE: Social History   Tobacco Use  . Smoking status: Light Tobacco Smoker    Packs/day: 0.25    Years: 40.00    Pack years: 10.00    Types: Cigarettes  . Smokeless tobacco: Never Used  . Tobacco comment: on and off   Vaping Use  . Vaping Use: Never used  Substance Use Topics  . Alcohol use: Yes    Alcohol/week: 0.0 standard drinks    Comment: rare glass of wine  . Drug use: No     Colonoscopy: about 10 years ago  PAP:   Bone density: 12/2019, osteopenia    Allergies  Allergen Reactions  . Ibuprofen Nausea Only and Other (See Comments)    Current Outpatient Medications  Medication Sig Dispense  Refill  . Accu-Chek Softclix Lancets lancets Use to check blood sugar daily 100 each 1  . acetaminophen (TYLENOL) 500 MG tablet Take 1 tablet (500 mg total) by mouth every 6 (six) hours as needed. (Patient taking differently: Take 500 mg by mouth every 6 (six) hours as needed for mild pain.) 30 tablet 0  . allopurinol (ZYLOPRIM) 100 MG tablet Take 1 tablet (100 mg total) by mouth daily. 90 tablet 3  . atorvastatin (LIPITOR) 40 MG tablet Take 1 tablet (40 mg total) by mouth daily. 135 tablet 0  . Blood Glucose Monitoring Suppl (ACCU-CHEK GUIDE) w/Device KIT 1 each by Does not apply route daily. Use to check blood sugar daily 1 kit 0  .  carvedilol (COREG) 25 MG tablet Take 1 tablet (25 mg total) by mouth 2 (two) times daily with a meal. 180 tablet 0  . furosemide (LASIX) 40 MG tablet Take 1 tablet (40 mg total) by mouth 2 (two) times daily. 180 tablet 0  . glipiZIDE (GLUCOTROL XL) 2.5 MG 24 hr tablet Take 1 tablet (2.5 mg total) by mouth daily with breakfast. 30 tablet 3  . glucose blood (ACCU-CHEK GUIDE) test strip Use to check blood sugar daily 100 each 1  . isosorbide mononitrate (IMDUR) 30 MG 24 hr tablet TAKE 1 TABLET(30 MG) BY MOUTH DAILY (Patient taking differently: Take 30 mg by mouth daily.) 90 tablet 2  . levothyroxine (SYNTHROID) 125 MCG tablet Take 1 tab by mouth every morning( on empty stomach) 30 minutes before taking other medications or before breakfast 30 tablet 2  . omeprazole (PRILOSEC) 40 MG capsule Take 1 capsule (40 mg total) by mouth daily as needed (acid reflux).    . ondansetron (ZOFRAN) 4 MG tablet Take 1 tablet (4 mg total) by mouth every 6 (six) hours as needed for nausea. 20 tablet 0  . RIVAROXABAN (XARELTO) VTE STARTER PACK (15 & 20 MG) Follow package directions: Take one 40m tablet by mouth twice a day. On day 22, switch to one 274mtablet once a day. Take with food. 51 each 0  . sacubitril-valsartan (ENTRESTO) 49-51 MG Take 1 tablet by mouth 2 (two) times daily. 60  tablet 3  . spironolactone (ALDACTONE) 50 MG tablet TAKE 1 TABLET(50 MG) BY MOUTH DAILY. Please make yearly appt with Dr. CoBurt Knackor November before anymore refills. Thank you 1st attempt (Patient taking differently: Take 50 mg by mouth daily.) 90 tablet 0  . traMADol (ULTRAM) 50 MG tablet Take 1 tablet (50 mg total) by mouth every 6 (six) hours as needed for moderate pain. 14 tablet 0   No current facility-administered medications for this visit.    OBJECTIVE: African-American woman who appears stated age  Vi31  04/13/20 1553  BP: (!) 182/80  Pulse: 89  Resp: 18  Temp: 97.9 F (36.6 C)  SpO2: 100%     Body mass index is 30.1 kg/m.   Wt Readings from Last 3 Encounters:  04/13/20 209 lb 12.8 oz (95.2 kg)  03/31/20 209 lb (94.8 kg)  03/15/20 209 lb 11.2 oz (95.1 kg)      ECOG FS:1 - Symptomatic but completely ambulatory  Ocular: Sclerae unicteric, pupils round and equal Ear-nose-throat: Wearing a mask Lymphatic: No cervical or supraclavicular adenopathy Lungs no rales or rhonchi Heart regular rate and rhythm Abd soft, nontender all quadrants, positive bowel sounds MSK no focal spinal tenderness, no joint edema Neuro: non-focal, well-oriented, appropriate affect Breasts: Deferred   LAB RESULTS:  CMP     Component Value Date/Time   NA 140 03/31/2020 0919   NA 143 06/13/2019 1428   K 4.9 03/31/2020 0919   CL 108 03/31/2020 0919   CO2 23 03/31/2020 0919   GLUCOSE 107 (H) 03/31/2020 0919   BUN 22 03/31/2020 0919   BUN 17 06/13/2019 1428   CREATININE 1.46 (H) 03/31/2020 0919   CALCIUM 9.7 03/31/2020 0919   PROT 6.4 (L) 03/15/2020 0058   ALBUMIN 2.8 (L) 03/15/2020 0058   AST 11 (L) 03/15/2020 0058   ALT 11 03/15/2020 0058   ALKPHOS 80 03/15/2020 0058   BILITOT 0.5 03/15/2020 0058   GFRNONAA 35 (L) 03/31/2020 0919   GFRAA 40 (L) 03/31/2020 0919    No results  found for: TOTALPROTELP, ALBUMINELP, A1GS, A2GS, BETS, BETA2SER, GAMS, MSPIKE, SPEI  Lab Results   Component Value Date   WBC 5.3 03/31/2020   NEUTROABS 2.7 03/31/2020   HGB 13.4 03/31/2020   HCT 41.2 03/31/2020   MCV 90.9 03/31/2020   PLT 299.0 03/31/2020    No results found for: LABCA2  No components found for: FRTMYT117  No results for input(s): INR in the last 168 hours.  No results found for: LABCA2  No results found for: BVA701  No results found for: IDC301  No results found for: THY388  No results found for: CA2729  No components found for: HGQUANT  No results found for: CEA1 / No results found for: CEA1   No results found for: AFPTUMOR  No results found for: CHROMOGRNA  No results found for: KPAFRELGTCHN, LAMBDASER, KAPLAMBRATIO (kappa/lambda light chains)  No results found for: HGBA, HGBA2QUANT, HGBFQUANT, HGBSQUAN (Hemoglobinopathy evaluation)   No results found for: LDH  No results found for: IRON, TIBC, IRONPCTSAT (Iron and TIBC)  No results found for: FERRITIN  Urinalysis    Component Value Date/Time   COLORURINE AMBER (A) 03/13/2020 2212   APPEARANCEUR CLOUDY (A) 03/13/2020 2212   LABSPEC 1.024 03/13/2020 2212   PHURINE 5.0 03/13/2020 2212   GLUCOSEU NEGATIVE 03/13/2020 2212   HGBUR NEGATIVE 03/13/2020 [place] 03/13/2020 2212   BILIRUBINUR neg 07/14/2019 0936   KETONESUR NEGATIVE 03/13/2020 2212   PROTEINUR >=300 (A) 03/13/2020 2212   UROBILINOGEN 0.2 07/14/2019 0936   UROBILINOGEN 0.2 01/28/2007 1647   NITRITE POSITIVE (A) 03/13/2020 2212   LEUKOCYTESUR TRACE (A) 03/13/2020 2212     STUDIES: No results found.   ELIGIBLE FOR AVAILABLE RESEARCH PROTOCOL: no  ASSESSMENT: 75 y.o. [place] woman with incidentally found portal vein thrombosis  December 2021 in course of work-up for diverticulosis.   (1) Jak 2, prothrombin gene and factor V Leiden probes 03/15/2020 found no evidence of mutations.    (2) started on intravenous heparin at diagnosis, transitioned to rivaroxaban as of 03/17/2020,     PLAN: Center is tolerating the rivaroxaban well.  We discussed alternatives which may become necessary if cost is a problem but she would very much prefer not to have to undergo monitoring as she would if she were on warfarin.  On the other hand a home monitoring might be an option in that case since she already knows how to "prick her finger" to test for her blood sugar  The question is how long to continue on the rivaroxaban.  Since presumably the cause of the clot was inflammation due to diverticular disease, this can flareup at any time in the future and so indefinite anticoagulation would seem reasonable.  This has to be balanced with the bleeding risk and we discussed how to minimize that today.    After much discussion she is agreeable to indefinite anticoagulation.  I am refilling her rivaroxaban prescription.  She will see me in October of this year and from that point we may see her on a yearly basis or as needed depending on her and her primary care physicians preference  Total encounter time 45 minutes.Sarajane Jews C. Iolani Twilley, MD 04/13/2020 4:12 PM Medical Oncology and Hematology [facility] [place], [place] [address] Tel. [phone]    Fax. [phone]   This document serves as a record of services personally performed by Lurline Del, MD. It was created on his behalf by Wilburn Mylar, a trained  medical scribe. The creation of this record is based on the scribe's personal observations and the provider's statements to them.   I, Lurline Del MD, have reviewed the above documentation for accuracy and completeness, and I agree with the above.    *Total Encounter Time as defined by the Centers for Medicare and Medicaid Services includes, in addition to the face-to-face time of a patient visit (documented in the note above) non-face-to-face time: obtaining and reviewing outside history, ordering and reviewing medications, tests or  procedures, care coordination (communications with other health care professionals or caregivers) and documentation in the medical record.

## 2020-04-13 ENCOUNTER — Inpatient Hospital Stay: Payer: Medicare PPO | Attending: Oncology | Admitting: Oncology

## 2020-04-13 ENCOUNTER — Other Ambulatory Visit: Payer: Self-pay

## 2020-04-13 ENCOUNTER — Encounter: Payer: Self-pay | Admitting: Family Medicine

## 2020-04-13 VITALS — BP 182/80 | HR 89 | Temp 97.9°F | Resp 18 | Ht 70.0 in | Wt 209.8 lb

## 2020-04-13 DIAGNOSIS — Z79899 Other long term (current) drug therapy: Secondary | ICD-10-CM | POA: Diagnosis not present

## 2020-04-13 DIAGNOSIS — I129 Hypertensive chronic kidney disease with stage 1 through stage 4 chronic kidney disease, or unspecified chronic kidney disease: Secondary | ICD-10-CM | POA: Diagnosis not present

## 2020-04-13 DIAGNOSIS — G4739 Other sleep apnea: Secondary | ICD-10-CM

## 2020-04-13 DIAGNOSIS — I10 Essential (primary) hypertension: Secondary | ICD-10-CM | POA: Diagnosis not present

## 2020-04-13 DIAGNOSIS — M8580 Other specified disorders of bone density and structure, unspecified site: Secondary | ICD-10-CM | POA: Insufficient documentation

## 2020-04-13 DIAGNOSIS — I81 Portal vein thrombosis: Secondary | ICD-10-CM | POA: Insufficient documentation

## 2020-04-13 DIAGNOSIS — E1122 Type 2 diabetes mellitus with diabetic chronic kidney disease: Secondary | ICD-10-CM | POA: Insufficient documentation

## 2020-04-13 DIAGNOSIS — E119 Type 2 diabetes mellitus without complications: Secondary | ICD-10-CM | POA: Diagnosis not present

## 2020-04-13 DIAGNOSIS — D689 Coagulation defect, unspecified: Secondary | ICD-10-CM | POA: Diagnosis not present

## 2020-04-13 DIAGNOSIS — I70219 Atherosclerosis of native arteries of extremities with intermittent claudication, unspecified extremity: Secondary | ICD-10-CM | POA: Diagnosis not present

## 2020-04-13 DIAGNOSIS — N189 Chronic kidney disease, unspecified: Secondary | ICD-10-CM | POA: Insufficient documentation

## 2020-04-13 DIAGNOSIS — Z95810 Presence of automatic (implantable) cardiac defibrillator: Secondary | ICD-10-CM | POA: Insufficient documentation

## 2020-04-13 DIAGNOSIS — E039 Hypothyroidism, unspecified: Secondary | ICD-10-CM | POA: Diagnosis not present

## 2020-04-13 DIAGNOSIS — Z90710 Acquired absence of both cervix and uterus: Secondary | ICD-10-CM | POA: Diagnosis not present

## 2020-04-13 DIAGNOSIS — G7289 Other specified myopathies: Secondary | ICD-10-CM | POA: Diagnosis not present

## 2020-04-13 DIAGNOSIS — I428 Other cardiomyopathies: Secondary | ICD-10-CM | POA: Diagnosis not present

## 2020-04-13 DIAGNOSIS — K5790 Diverticulosis of intestine, part unspecified, without perforation or abscess without bleeding: Secondary | ICD-10-CM | POA: Diagnosis not present

## 2020-04-13 DIAGNOSIS — I739 Peripheral vascular disease, unspecified: Secondary | ICD-10-CM | POA: Diagnosis not present

## 2020-04-13 DIAGNOSIS — Z720 Tobacco use: Secondary | ICD-10-CM | POA: Diagnosis not present

## 2020-04-13 DIAGNOSIS — I5042 Chronic combined systolic (congestive) and diastolic (congestive) heart failure: Secondary | ICD-10-CM

## 2020-04-13 DIAGNOSIS — K5732 Diverticulitis of large intestine without perforation or abscess without bleeding: Secondary | ICD-10-CM

## 2020-04-13 MED ORDER — RIVAROXABAN 20 MG PO TABS: 20.0000 mg | ORAL_TABLET | Freq: Every day | ORAL | 4 refills | Status: DC

## 2020-04-14 ENCOUNTER — Other Ambulatory Visit: Payer: Self-pay | Admitting: Cardiovascular Disease

## 2020-04-14 ENCOUNTER — Telehealth: Payer: Self-pay | Admitting: Oncology

## 2020-04-14 DIAGNOSIS — I5022 Chronic systolic (congestive) heart failure: Secondary | ICD-10-CM

## 2020-04-14 DIAGNOSIS — I739 Peripheral vascular disease, unspecified: Secondary | ICD-10-CM

## 2020-04-14 DIAGNOSIS — E782 Mixed hyperlipidemia: Secondary | ICD-10-CM

## 2020-04-14 DIAGNOSIS — I10 Essential (primary) hypertension: Secondary | ICD-10-CM

## 2020-04-14 NOTE — Telephone Encounter (Signed)
Scheduled appts per 1/11 los. Was not able to leave a voicemail. Will mail appt reminder and calendar on 1/18 per staff msg.

## 2020-04-27 ENCOUNTER — Ambulatory Visit (INDEPENDENT_AMBULATORY_CARE_PROVIDER_SITE_OTHER): Payer: Medicare PPO | Admitting: Internal Medicine

## 2020-04-27 ENCOUNTER — Other Ambulatory Visit: Payer: Self-pay

## 2020-04-27 ENCOUNTER — Encounter: Payer: Self-pay | Admitting: Internal Medicine

## 2020-04-27 VITALS — BP 150/80 | HR 76 | Ht 70.5 in | Wt 210.2 lb

## 2020-04-27 DIAGNOSIS — Z95810 Presence of automatic (implantable) cardiac defibrillator: Secondary | ICD-10-CM

## 2020-04-27 DIAGNOSIS — I428 Other cardiomyopathies: Secondary | ICD-10-CM | POA: Diagnosis not present

## 2020-04-27 DIAGNOSIS — I5042 Chronic combined systolic (congestive) and diastolic (congestive) heart failure: Secondary | ICD-10-CM | POA: Diagnosis not present

## 2020-04-27 NOTE — Progress Notes (Signed)
Patient Care Team: [name], MD as PCP - General (Family Medicine) [name], MD as PCP - Cardiology (Cardiology) [name], MD as PCP - Electrophysiology (Cardiology)   HPI  Paula Stein is a 75 y.o. 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 Device suffered class I advisory recall for early battery depletion  which prompted device generator replacement 8/21  Site has healed well   Hospitalized for abdominal pain 12/21.  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  Discharged on anticoagulation  Otherwise no chest pain or shortness of breath.   DATE TEST EF   4/16 Echo  30-35 % Severe LVH/LAE  2/19 Echo 25-30% Severe LVH  3/20 Echo  30-35%     Date Cr K Hgb TSH  12/17 1.02 4.4  6.34  9/18 1.22 4.4  39.32  6/19 0.99 4.3  2.78  6/20  1.48 4.1  (12/19) 4.6  5/21 1.21 4.2  0.06  12/21 1.46 4.9 13.4 5.23       hyperthyroidism s/p RAI ablation, diabetes, recent down titration 5/21  Past Medical History:  Diagnosis Date  . AICD (automatic cardioverter/defibrillator) present 10/08/2014   SUBQ    /    DR [name]  . CHF (congestive heart failure) ([place])   . Diabetes mellitus without complication ([place])   . History of cardiac cath 05/2007   normal-with patent coronaries  . History of colonoscopy   . History of hiatal hernia   . Hypertension   . Iatrogenic thyroiditis   . Non-ischemic cardiomyopathy ([place])    EF 28%- reassessment of LV function 2011 with LVEf 45-50%  . PAD (peripheral artery disease) ([facility])    lower extremities with ABIs of 0.5 bilaterally  . Personal history of goiter   . S/P radioactive iodine thyroid ablation   . S/P thyroidectomy   . Shortness of breath dyspnea     Past Surgical History:  Procedure Laterality Date  . EP IMPLANTABLE DEVICE N/A  10/08/2014   Procedure: SubQ ICD Implant;  Surgeon: [name], MD;  Location: [facility];  Service: Cardiovascular;  Laterality: N/A;  . SUBQ ICD CHANGEOUT N/A 11/14/2019   Procedure: WLNL ICD CHANGEOUT;  Surgeon: [name], MD;  Location: [facility];  Service: Cardiovascular;  Laterality: N/A;  . THYROIDECTOMY    . TONSILLECTOMY    . TOTAL ABDOMINAL HYSTERECTOMY      Current Outpatient Medications  Medication Sig Dispense Refill  . Accu-Chek Softclix Lancets lancets Use to check blood sugar daily 100 each 1  . acetaminophen (TYLENOL) 500 MG tablet Take 1 tablet (500 mg total) by mouth every 6 (six) hours as needed. (Patient taking differently: Take 500 mg by mouth every 6 (six) hours as needed for mild pain.) 30 tablet 0  . allopurinol (ZYLOPRIM) 100 MG tablet Take 1 tablet (100 mg total) by mouth daily. 90 tablet 3  . atorvastatin (LIPITOR) 40 MG tablet Take 1 tablet (40 mg total) by mouth daily. 135 tablet 0  . Blood Glucose Monitoring Suppl (ACCU-CHEK GUIDE) w/Device KIT 1 each by Does not apply route daily. Use to check blood sugar daily 1 kit 0  . carvedilol (COREG) 25 MG tablet Take 1 tablet (25 mg total) by mouth 2 (two) times daily with a meal. 180 tablet 0  .  furosemide (LASIX) 40 MG tablet Take 1 tablet (40 mg total) by mouth 2 (two) times daily. 180 tablet 0  . glipiZIDE (GLUCOTROL XL) 2.5 MG 24 hr tablet Take 1 tablet (2.5 mg total) by mouth daily with breakfast. 30 tablet 3  . glucose blood (ACCU-CHEK GUIDE) test strip Use to check blood sugar daily 100 each 1  . isosorbide mononitrate (IMDUR) 30 MG 24 hr tablet TAKE 1 TABLET(30 MG) BY MOUTH DAILY (Patient taking differently: Take 30 mg by mouth daily.) 90 tablet 2  . levothyroxine (SYNTHROID) 125 MCG tablet Take 1 tab by mouth every morning( on empty stomach) 30 minutes before taking other medications or before breakfast 30 tablet 2  . omeprazole (PRILOSEC) 40 MG capsule Take 1 capsule (40 mg total) by  mouth daily as needed (acid reflux).    . ondansetron (ZOFRAN) 4 MG tablet Take 1 tablet (4 mg total) by mouth every 6 (six) hours as needed for nausea. 20 tablet 0  . rivaroxaban (XARELTO) 20 MG TABS tablet Take 1 tablet (20 mg total) by mouth daily with supper. 90 tablet 4  . sacubitril-valsartan (ENTRESTO) 49-51 MG Take 1 tablet by mouth 2 (two) times daily. 60 tablet 3  . spironolactone (ALDACTONE) 50 MG tablet TAKE 1 TABLET(50 MG) BY MOUTH DAILY 90 tablet 0  . traMADol (ULTRAM) 50 MG tablet Take 1 tablet (50 mg total) by mouth every 6 (six) hours as needed for moderate pain. 14 tablet 0   No current facility-administered medications for this visit.    Allergies  Allergen Reactions  . Ibuprofen Nausea Only and Other (See Comments)      Review of Systems negative except from HPI and PMH  Physical Exam BP (!) 150/80 (BP Location: Left Arm, Patient Position: Sitting, Cuff Size: Normal)   Pulse 76   Ht 5' 10.5" (1.791 m)   Wt 210 lb 4 oz (95.4 kg)   SpO2 94%   BMI 29.74 kg/m  Well developed and well nourished in no acute distress HENT normal Neck supple with JVP-flat Clear Device pocket well healed; without hematoma or erythema.  There is no tethering  Regular rate and rhythm, no  gallop No / murmur Abd-soft with active BS No Clubbing cyanosis edema Skin-warm and dry A & Oriented  Grossly normal sensory and motor function  ECG sinus at 76 Intervals 22/10/40 PVC-left bundle            Assessment and  Plan   Nonischemic cardiomyopathy  Left ventricular hypertrophy-severe  CHF  Chronic systolic  ICD-Subcutaneous     s/p GEN change secondary to device failure  Obesity  Hypertension  High Risk Medication Surveillance aldactone   Severe LVH   need cMRI to exclude HCM   Euvolemic continue current meds  BP modestly elevated discussed the importance of salt restriction

## 2020-04-27 NOTE — Patient Instructions (Signed)
Medication Instructions:  - Your physician recommends that you continue on your current medications as directed. Please refer to the Current Medication list given to you today.  *If you need a refill on your cardiac medications before your next appointment, please call your pharmacy*   Lab Work: - none ordered  If you have labs (blood work) drawn today and your tests are completely normal, you will receive your results only by: Marland Kitchen MyChart Message (if you have MyChart) OR . A paper copy in the mail If you have any lab test that is abnormal or we need to change your treatment, we will call you to review the results.   Testing/Procedures: - none ordered   Follow-Up: At [facility], you and your health needs are our priority.  As part of our continuing mission to provide you with exceptional heart care, we have created designated Provider Care Teams.  These Care Teams include your primary Cardiologist (physician) and Advanced Practice Providers (APPs -  Physician Assistants and Nurse Practitioners) who all work together to provide you with the care you need, when you need it.  We recommend signing up for the patient portal called "MyChart".  Sign up information is provided on this After Visit Summary.  MyChart is used to connect with patients for Virtual Visits (Telemedicine).  Patients are able to view lab/test results, encounter notes, upcoming appointments, etc.  Non-urgent messages can be sent to your provider as well.   To learn more about what you can do with MyChart, go to [email or web address].    Your next appointment:   9 month(s)  The format for your next appointment:   In Person  Provider:   Virl Axe, MD   Other Instructions n/a

## 2020-04-30 ENCOUNTER — Ambulatory Visit (INDEPENDENT_AMBULATORY_CARE_PROVIDER_SITE_OTHER): Payer: Medicare PPO

## 2020-04-30 DIAGNOSIS — I428 Other cardiomyopathies: Secondary | ICD-10-CM

## 2020-04-30 LAB — CUP PACEART REMOTE DEVICE CHECK
Battery Remaining Percentage: 96 %
Date Time Interrogation Session: 20220127172100
Implantable Lead Implant Date: 20160707
Implantable Lead Location: 753862
Implantable Lead Model: 3401
Implantable Pulse Generator Implant Date: 20210813
Pulse Gen Serial Number: 143555

## 2020-05-03 ENCOUNTER — Ambulatory Visit: Payer: Medicare PPO | Admitting: Physician Assistant

## 2020-05-05 ENCOUNTER — Encounter: Payer: Self-pay | Admitting: Physician Assistant

## 2020-05-05 ENCOUNTER — Other Ambulatory Visit: Payer: Self-pay

## 2020-05-05 ENCOUNTER — Ambulatory Visit (INDEPENDENT_AMBULATORY_CARE_PROVIDER_SITE_OTHER): Payer: Medicare PPO | Admitting: Physician Assistant

## 2020-05-05 ENCOUNTER — Telehealth: Payer: Self-pay

## 2020-05-05 VITALS — BP 122/64 | HR 71 | Ht 70.0 in | Wt 214.0 lb

## 2020-05-05 DIAGNOSIS — K5792 Diverticulitis of intestine, part unspecified, without perforation or abscess without bleeding: Secondary | ICD-10-CM | POA: Diagnosis not present

## 2020-05-05 DIAGNOSIS — Z8719 Personal history of other diseases of the digestive system: Secondary | ICD-10-CM | POA: Diagnosis not present

## 2020-05-05 DIAGNOSIS — Z7901 Long term (current) use of anticoagulants: Secondary | ICD-10-CM

## 2020-05-05 MED ORDER — CLENPIQ 10-3.5-12 MG-GM -GM/160ML PO SOLN: 1.0000 | ORAL | 0 refills | Status: DC

## 2020-05-05 NOTE — Progress Notes (Signed)
Chief Complaint: History of diverticulitis  HPI:    Paula Stein is a 75 year old female with a past medical history of AICD placement due to CHF (last echo 06/04/2018 with an ejection fraction of 30 to 35%), PAD and recent embolus thought due to diverticulitis started on Xarelto, who was referred to me by [name], MD for follow-up after recent episode of diverticulitis.      08/22/2004 history of colonoscopy.  We cannot see report in computer but patient tells me she thinks this may have been done in [facility].    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  She was initially started on heparin drip and switched to oral Xarelto and improved with antibiotics.  Was recommended she follow with [place] for possible colonoscopy.    Today, the patient tells me that she actually stopped her antibiotics just a couple of days early because it was giving her severe itching, but she has no further abdominal pain or symptoms.  She is very thankful for that.  Tells me she and [name] discussed the Xarelto and she is going to stay on this long-term.    Patient does tell me that she had her AICD placed after her last echo, so she thinks her heart function is actually better.    Denies fever, chills, weight loss or symptoms that awaken her from sleep.  Past Medical History:  Diagnosis Date  . AICD (automatic cardioverter/defibrillator) present 10/08/2014   SUBQ    /    DR [name]  . CHF (congestive heart failure) ([place])   . Diabetes mellitus without complication ([place])   . History of cardiac cath 05/2007   normal-with patent coronaries  . History of colonoscopy   . History of hiatal hernia   . Hypertension   . Iatrogenic thyroiditis   . Non-ischemic cardiomyopathy ([place])    EF 28%- reassessment of LV function 2011 with LVEf 45-50%  . PAD (peripheral artery disease) ([facility])    lower extremities with ABIs  of 0.5 bilaterally  . Personal history of goiter   . S/P radioactive iodine thyroid ablation   . S/P thyroidectomy   . Shortness of breath dyspnea     Past Surgical History:  Procedure Laterality Date  . EP IMPLANTABLE DEVICE N/A 10/08/2014   Procedure: SubQ ICD Implant;  Surgeon: [name], MD;  Location: [facility];  Service: Cardiovascular;  Laterality: N/A;  . SUBQ ICD CHANGEOUT N/A 11/14/2019   Procedure: DEYC ICD CHANGEOUT;  Surgeon: [name], MD;  Location: [facility];  Service: Cardiovascular;  Laterality: N/A;  . THYROIDECTOMY    . TONSILLECTOMY    . TOTAL ABDOMINAL HYSTERECTOMY      Current Outpatient Medications  Medication Sig Dispense Refill  . Accu-Chek Softclix Lancets lancets Use to check blood sugar daily 100 each 1  . acetaminophen (TYLENOL) 500 MG tablet Take 1 tablet (500 mg total) by mouth every 6 (six) hours as needed. (Patient taking differently: Take 500 mg by mouth every 6 (six) hours as needed for mild pain.) 30 tablet 0  . allopurinol (ZYLOPRIM) 100 MG tablet Take 1 tablet (100 mg total) by mouth daily. 90 tablet 3  . atorvastatin (LIPITOR) 40 MG tablet Take 1 tablet (40 mg total) by mouth daily. 135 tablet 0  . Blood Glucose Monitoring Suppl (ACCU-CHEK GUIDE) w/Device KIT 1 each by Does not apply route daily. Use to  check blood sugar daily 1 kit 0  . carvedilol (COREG) 25 MG tablet Take 1 tablet (25 mg total) by mouth 2 (two) times daily with a meal. 180 tablet 0  . furosemide (LASIX) 40 MG tablet Take 1 tablet (40 mg total) by mouth 2 (two) times daily. 180 tablet 0  . glipiZIDE (GLUCOTROL XL) 2.5 MG 24 hr tablet Take 1 tablet (2.5 mg total) by mouth daily with breakfast. 30 tablet 3  . glucose blood (ACCU-CHEK GUIDE) test strip Use to check blood sugar daily 100 each 1  . isosorbide mononitrate (IMDUR) 30 MG 24 hr tablet TAKE 1 TABLET(30 MG) BY MOUTH DAILY (Patient taking differently: Take 30 mg by mouth daily.) 90 tablet 2  .  levothyroxine (SYNTHROID) 125 MCG tablet Take 1 tab by mouth every morning( on empty stomach) 30 minutes before taking other medications or before breakfast 30 tablet 2  . omeprazole (PRILOSEC) 40 MG capsule Take 1 capsule (40 mg total) by mouth daily as needed (acid reflux).    . ondansetron (ZOFRAN) 4 MG tablet Take 1 tablet (4 mg total) by mouth every 6 (six) hours as needed for nausea. 20 tablet 0  . rivaroxaban (XARELTO) 20 MG TABS tablet Take 1 tablet (20 mg total) by mouth daily with supper. 90 tablet 4  . sacubitril-valsartan (ENTRESTO) 49-51 MG Take 1 tablet by mouth 2 (two) times daily. 60 tablet 3  . spironolactone (ALDACTONE) 50 MG tablet TAKE 1 TABLET(50 MG) BY MOUTH DAILY 90 tablet 0  . traMADol (ULTRAM) 50 MG tablet Take 1 tablet (50 mg total) by mouth every 6 (six) hours as needed for moderate pain. 14 tablet 0   No current facility-administered medications for this visit.    Allergies as of 05/05/2020 - Review Complete 05/05/2020  Allergen Reaction Noted  . Ibuprofen Nausea Only and Other (See Comments) 10/08/2014    Family History  Problem Relation Age of Onset  . Diabetes type II Mother   . Hypertension Mother   . Diabetes Mother   . Heart disease Mother   . Other Father        deceased from accident age 48  . Hyperlipidemia Father   . Breast cancer Neg Hx   . Stomach cancer Neg Hx   . Colon cancer Neg Hx   . Esophageal cancer Neg Hx   . Pancreatic cancer Neg Hx     Social History   Socioeconomic History  . Marital status: Divorced    Spouse name: Not on file  . Number of children: 1  . Years of education: Not on file  . Highest education level: Not on file  Occupational History  . Occupation: Retired    Fish farm manager: RETIRED    CommentAdvertising copywriter  Tobacco Use  . Smoking status: Light Tobacco Smoker    Packs/day: 0.25    Years: 40.00    Pack years: 10.00    Types: Cigarettes  . Smokeless tobacco: Never Used  . Tobacco comment: on and off    Vaping Use  . Vaping Use: Never used  Substance and Sexual Activity  . Alcohol use: Yes    Alcohol/week: 0.0 standard drinks    Comment: rare glass of wine  . Drug use: No  . Sexual activity: Not Currently    Birth control/protection: None  Other Topics Concern  . Not on file  Social History Narrative   Retired Education officer, museum   Divorced - one grown son   current smoker  Alcohol use-no      Drug use-no    Regular exercise- no     son - Maddilyn Campus   Social Determinants of Health   Financial Resource Strain: Low Risk   . Difficulty of Paying Living Expenses: Not hard at all  Food Insecurity: Not on file  Transportation Needs: No Transportation Needs  . Lack of Transportation (Medical): No  . Lack of Transportation (Non-Medical): No  Physical Activity: Inactive  . Days of Exercise per Week: 0 days  . Minutes of Exercise per Session: 0 min  Stress: No Stress Concern Present  . Feeling of Stress : Not at all  Social Connections: Moderately Integrated  . Frequency of Communication with Friends and Family: More than three times a week  . Frequency of Social Gatherings with Friends and Family: More than three times a week  . Attends Religious Services: More than 4 times per year  . Active Member of Clubs or Organizations: Yes  . Attends Archivist Meetings: 1 to 4 times per year  . Marital Status: Divorced  Human resources officer Violence: Not At Risk  . Fear of Current or Ex-Partner: No  . Emotionally Abused: No  . Physically Abused: No  . Sexually Abused: No    Review of Systems:    Constitutional: No weight loss, fever or chills Skin: No rash  Cardiovascular: No chest pain Respiratory: No SOB  Gastrointestinal: See HPI and otherwise negative Genitourinary: No dysuria  Neurological: No headache, dizziness or syncope Musculoskeletal: No new muscle or joint pain Hematologic: No bleeding  Psychiatric: No history of depression or anxiety   Physical Exam:   Vital signs: BP 122/64   Pulse 71   Ht 5' 10"  (1.778 m)   Wt 214 lb (97.1 kg)   BMI 30.71 kg/m   Constitutional:   Pleasant AA female appears to be in NAD, Well developed, Well nourished, alert and cooperative Head:  Normocephalic and atraumatic. Eyes:   PEERL, EOMI. No icterus. Conjunctiva pink. Ears:  Normal auditory acuity. Neck:  Supple Throat: Oral cavity and pharynx without inflammation, swelling or lesion.  Respiratory: Respirations even and unlabored. Lungs clear to auscultation bilaterally.   No wheezes, crackles, or rhonchi.  Cardiovascular: Normal S1, S2. No MRG. Regular rate and rhythm. No peripheral edema, cyanosis or pallor.  Gastrointestinal:  Soft, nondistended, nontender. No rebound or guarding. Normal bowel sounds. No appreciable masses or hepatomegaly. Rectal:  Not performed.  Msk:  Symmetrical without gross deformities. Without edema, no deformity or joint abnormality.  Neurologic:  Alert and  oriented x4;  grossly normal neurologically.  Skin:   Dry and intact without significant lesions or rashes. Psychiatric: Demonstrates good judgement and reason without abnormal affect or behaviors.  RELEVANT LABS AND IMAGING: CBC    Component Value Date/Time   WBC 5.3 03/31/2020 0919   RBC 4.53 03/31/2020 0919   HGB 13.4 03/31/2020 0919   HCT 41.2 03/31/2020 0919   PLT 299.0 03/31/2020 0919   MCV 90.9 03/31/2020 0919   MCH 29.0 03/17/2020 0724   MCHC 32.4 03/31/2020 0919   RDW 15.4 03/31/2020 0919   LYMPHSABS 2.0 03/31/2020 0919   MONOABS 0.5 03/31/2020 0919   EOSABS 0.1 03/31/2020 0919   BASOSABS 0.0 03/31/2020 0919    CMP     Component Value Date/Time   NA 140 03/31/2020 0919   NA 143 06/13/2019 1428   K 4.9 03/31/2020 0919   CL 108 03/31/2020 0919   CO2 23 03/31/2020 0919  GLUCOSE 107 (H) 03/31/2020 0919   BUN 22 03/31/2020 0919   BUN 17 06/13/2019 1428   CREATININE 1.46 (H) 03/31/2020 0919   CALCIUM 9.7 03/31/2020 0919   PROT 6.4 (L) 03/15/2020  0058   ALBUMIN 2.8 (L) 03/15/2020 0058   AST 11 (L) 03/15/2020 0058   ALT 11 03/15/2020 0058   ALKPHOS 80 03/15/2020 0058   BILITOT 0.5 03/15/2020 0058   GFRNONAA 35 (L) 03/31/2020 0919   GFRAA 40 (L) 03/31/2020 0919    Assessment: 1.  History of diverticulitis: Recently hospitalized, discharged 03/17/2020, is recommended she have a colonoscopy at discharge 1 in many years 2.  Anticoagulation for portal vein thrombosis: On Xarelto  Plan: 1.  Patient will need to be scheduled for colonoscopy in the hospital given decreased ejection fraction on last echo.  I discussed this with the patient.  She will be scheduled with Dr. Bryan Lemma.  Did provide patient with a detailed list of risk for the procedure and she agrees to proceed. 2.  Advised patient to hold her Xarelto for 2 days prior to time of procedure.  We will communicate with [name] to ensure this is acceptable for her. 3.  Patient to follow in clinic per recommendations from Dr. Bryan Lemma after time of procedure.  [name], PA-C [facility] 05/05/2020, 10:20 AM  Cc: [name], MD

## 2020-05-05 NOTE — Telephone Encounter (Signed)
Pharmacy please comment on the request to hold anticoagulation in this patient prior to colonoscopy.  Kerin Ransom PA-C 05/05/2020 2:05 PM

## 2020-05-05 NOTE — Patient Instructions (Signed)
If you are age 77 or older, your body mass index should be between 23-30. Your Body mass index is 30.71 kg/m. If this is out of the aforementioned range listed, please consider follow up with your Primary Care Provider.  If you are age 66 or younger, your body mass index should be between 19-25. Your Body mass index is 30.71 kg/m. If this is out of the aformentioned range listed, please consider follow up with your Primary Care Provider.   You have been scheduled for a colonoscopy. Please follow written instructions given to you at your visit today.  Please pick up your prep supplies at the pharmacy within the next 1-3 days. If you use inhalers (even only as needed), please bring them with you on the day of your procedure.   Thank you for choosing me and [facility].  [name], PA-C

## 2020-05-05 NOTE — Telephone Encounter (Signed)
[facility] Pre-operative Risk Assessment     Request for surgical clearance:     Endoscopy Procedure  What type of surgery is being performed?     Colonoscopy  When is this surgery scheduled?     06/09/20  What type of clearance is required ?   Pharmacy  Are there any medications that need to be held prior to surgery and how long? Xarelto 2 days  Practice name and name of physician performing surgery?      [facility]  What is your office phone and fax number?      Phone- [phone]  [phone]  Anesthesia type (None, local, MAC, general) ?       MAC

## 2020-05-06 NOTE — Telephone Encounter (Signed)
Patient with diagnosis of portal vein thrombosis on Xarelto for anticoagulation.    Procedure: colonoscopy Date of procedure: 06/09/20  CrCl 50.3 Platelet count 299  Patient is currently on anticoagulation for portal vein thrombus found 03/14/20.  Per heme/onc will need 6 months of anticoagulation.  Would recommend no more than 1 day hold if colonoscopy cannot be rescheduled to June, but will defer to Dr. [name] for final decision.

## 2020-05-06 NOTE — Telephone Encounter (Signed)
Agree Erasmo Downer with you Unless its urgent would postpone colonoscopy Thanks SK

## 2020-05-07 ENCOUNTER — Encounter: Payer: Self-pay | Admitting: Family Medicine

## 2020-05-07 NOTE — Telephone Encounter (Signed)
Called the requesting office and fax was received

## 2020-05-07 NOTE — Progress Notes (Signed)
Agree with the assessment and plan as outlined by Jennifer Lemmon, PA-C. ? ?Jarmal Lewelling, DO, FACG ? ?

## 2020-05-07 NOTE — Telephone Encounter (Signed)
   Primary Cardiologist: [name], MD  Chart reviewed as part of pre-operative protocol coverage. Patient was contacted 05/07/2020 in reference to pre-operative risk assessment for pending surgery as outlined below.  Left a voicemail for patient to call back to discuss need to delay surgery.  She was recently diagnosed with a portal vein thrombus 03/2020. Per pharmacy and Dr. Olin Pia recommendations, patient should remain on uninterrupted anticoagulation for 6 months following her diagnosis. Favor delaying non-emergent procedures until 09/2020.   Pre-op covering staff: - Please contact requesting surgeon's office via preferred method (i.e, phone, fax) to inform them of need to delay her colonoscopy until 09/2020. They should resubmit a clearance request 08/2020 to better assess her risk closer to the time of her procedure.   Abigail Butts, PA-C 05/07/2020, 10:32 AM

## 2020-05-07 NOTE — Telephone Encounter (Signed)
Fax to requesting office

## 2020-05-07 NOTE — Telephone Encounter (Signed)
Called patient to let her know that Cardiology preferred she waited until 09/2020 to have her Colonoscopy.

## 2020-05-08 NOTE — Progress Notes (Signed)
Remote ICD transmission.   

## 2020-05-24 ENCOUNTER — Telehealth: Payer: Self-pay | Admitting: Family Medicine

## 2020-05-24 NOTE — Telephone Encounter (Signed)
Paula Stein is calling and wanted to see if provider received a fax regarding patient getting diabetic shoes, please advise. CB is [phone]

## 2020-05-25 NOTE — Telephone Encounter (Signed)
Spoke with Emilee Hero advised that the office has not received pt diabetic shoes form, stated that he will fax over the form to the office

## 2020-05-26 NOTE — Telephone Encounter (Signed)
FYI Spoke with pt state that [facility] scheduled a colonoscopy but the appointment was cancelled due to pt being on blood thinner from her Oncology. Pt state that they cancelled her appointment until July 2022. Pt states that she has appointment scheduled with Oncology in a few months to discuss on if to continue taking the Blood thinner. State that then she will call [facility] if the Oncology takes her off the blood thinner.Pt state that she wants to let Dr Volanda Napoleon know that she has not had the colonoscopy done yet.

## 2020-05-31 ENCOUNTER — Encounter: Payer: Self-pay | Admitting: Family Medicine

## 2020-05-31 DIAGNOSIS — M064 Inflammatory polyarthropathy: Secondary | ICD-10-CM | POA: Diagnosis not present

## 2020-05-31 DIAGNOSIS — M109 Gout, unspecified: Secondary | ICD-10-CM | POA: Diagnosis not present

## 2020-05-31 DIAGNOSIS — R768 Other specified abnormal immunological findings in serum: Secondary | ICD-10-CM | POA: Diagnosis not present

## 2020-05-31 DIAGNOSIS — M329 Systemic lupus erythematosus, unspecified: Secondary | ICD-10-CM | POA: Diagnosis not present

## 2020-06-03 ENCOUNTER — Encounter: Payer: Self-pay | Admitting: Family Medicine

## 2020-06-04 ENCOUNTER — Other Ambulatory Visit (HOSPITAL_COMMUNITY): Payer: Medicare PPO

## 2020-06-09 ENCOUNTER — Ambulatory Visit (HOSPITAL_COMMUNITY): Admit: 2020-06-09 | Payer: Medicare PPO | Admitting: Gastroenterology

## 2020-06-09 ENCOUNTER — Encounter (HOSPITAL_COMMUNITY): Payer: Self-pay

## 2020-06-09 LAB — HM DIABETES EYE EXAM

## 2020-06-09 SURGERY — COLONOSCOPY WITH PROPOFOL
Anesthesia: Monitor Anesthesia Care

## 2020-06-15 ENCOUNTER — Telehealth: Payer: Self-pay | Admitting: Family Medicine

## 2020-06-15 NOTE — Telephone Encounter (Signed)
Paula Stein would like Dr. Volanda Napoleon to contact regarding the patient's diabetic shoes.   He states 4 requests have been sent over. He is the one who works for [employer] that issues the shoes.   Please advise.

## 2020-06-23 ENCOUNTER — Encounter: Payer: Self-pay | Admitting: Family Medicine

## 2020-06-25 NOTE — Telephone Encounter (Signed)
Spoke with Clair Gulling, he stated he hand delivered papers to our office, checked with front desk and no papers were left from him only a business card.

## 2020-06-27 ENCOUNTER — Encounter: Payer: Self-pay | Admitting: Family Medicine

## 2020-06-28 NOTE — Telephone Encounter (Signed)
Form was received and completed. Spoke with Clair Gulling 06/28/20 and advised form was at front desk.

## 2020-07-03 DIAGNOSIS — E119 Type 2 diabetes mellitus without complications: Secondary | ICD-10-CM | POA: Diagnosis not present

## 2020-07-06 ENCOUNTER — Encounter: Payer: Self-pay | Admitting: Internal Medicine

## 2020-07-07 ENCOUNTER — Ambulatory Visit: Payer: Medicare PPO | Admitting: Cardiovascular Disease

## 2020-07-07 ENCOUNTER — Other Ambulatory Visit: Payer: Self-pay

## 2020-07-07 ENCOUNTER — Encounter: Payer: Self-pay | Admitting: Cardiovascular Disease

## 2020-07-07 VITALS — BP 150/90 | HR 73 | Ht 70.0 in | Wt 212.6 lb

## 2020-07-07 DIAGNOSIS — N1832 Chronic kidney disease, stage 3b: Secondary | ICD-10-CM | POA: Diagnosis not present

## 2020-07-07 DIAGNOSIS — I5042 Chronic combined systolic (congestive) and diastolic (congestive) heart failure: Secondary | ICD-10-CM

## 2020-07-07 DIAGNOSIS — I10 Essential (primary) hypertension: Secondary | ICD-10-CM | POA: Diagnosis not present

## 2020-07-07 NOTE — Progress Notes (Signed)
Cardiology Office Note:    Date:  07/07/2020   ID:  [name], DOB Dec 14, 1945, MRN [id number]  PCP:  [name], MD   [name]  Cardiologist:  [name], MD  Advanced Practice Provider:  No care team member to display Electrophysiologist:  Virl Axe, MD       Referring MD: [name], MD   Chief Complaint  Patient presents with  . Congestive Heart Failure    History of Present Illness:    Paula Stein is a 75 y.o. female with a hx of chronic systolic heart failure secondary to nonischemic cardiomyopathy, hypertension, type 2 diabetes, and stage III chronic kidney disease.  The patient is here alone today.  She had undergone ICD implantation but a device recall occurred.  She then underwent subcu ICD implantation in 2016.  The patient underwent a battery change last year.  Overall she is doing well.  She has shortness of breath with climbing 2 flights of stairs.  This is stable.  Denies orthopnea, PND, leg swelling, orthopnea, or PND.  No chest pain or pressure.  The patient is taking all of her Entresto at one time and then taking all of her carvedilol at another time during the day.  She is not taking either of these medicines twice daily as prescribed.  She did not take any of her medicines yet today.  Past Medical History:  Diagnosis Date  . AICD (automatic cardioverter/defibrillator) present 10/08/2014   SUBQ    /    DR [name]  . CHF (congestive heart failure) ([place])   . Diabetes mellitus without complication ([place])   . History of cardiac cath 05/2007   normal-with patent coronaries  . History of colonoscopy   . History of hiatal hernia   . Hypertension   . Iatrogenic thyroiditis   . Non-ischemic cardiomyopathy ([place])    EF 28%- reassessment of LV function 2011 with LVEf 45-50%  . PAD (peripheral artery disease) (HCC)    lower extremities with ABIs of 0.5 bilaterally  . Personal history of goiter   . S/P radioactive iodine  thyroid ablation   . S/P thyroidectomy   . Shortness of breath dyspnea     Past Surgical History:  Procedure Laterality Date  . EP IMPLANTABLE DEVICE N/A 10/08/2014   Procedure: SubQ ICD Implant;  Surgeon: [name], MD;  Location: [facility];  Service: Cardiovascular;  Laterality: N/A;  . SUBQ ICD CHANGEOUT N/A 11/14/2019   Procedure: [facility] ICD CHANGEOUT;  Surgeon: [name], MD;  Location: [facility];  Service: Cardiovascular;  Laterality: N/A;  . THYROIDECTOMY    . TONSILLECTOMY    . TOTAL ABDOMINAL HYSTERECTOMY      Current Medications: Current Meds  Medication Sig  . Accu-Chek Softclix Lancets lancets Use to check blood sugar daily  . acetaminophen (TYLENOL) 500 MG tablet Take 1 tablet (500 mg total) by mouth every 6 (six) hours as needed.  Marland Kitchen allopurinol (ZYLOPRIM) 100 MG tablet Take 1 tablet (100 mg total) by mouth daily.  Marland Kitchen atorvastatin (LIPITOR) 40 MG tablet Take 1 tablet (40 mg total) by mouth daily.  . Blood Glucose Monitoring Suppl (ACCU-CHEK GUIDE) w/Device KIT 1 each by Does not apply route daily. Use to check blood sugar daily  . carvedilol (COREG) 25 MG tablet Take 1 tablet (25 mg total) by mouth 2 (two) times daily with a meal.  . furosemide (LASIX) 40 MG tablet Take  1 tablet (40 mg total) by mouth 2 (two) times daily.  Marland Kitchen glipiZIDE (GLUCOTROL XL) 2.5 MG 24 hr tablet Take 1 tablet (2.5 mg total) by mouth daily with breakfast.  . glucose blood (ACCU-CHEK GUIDE) test strip Use to check blood sugar daily  . isosorbide mononitrate (IMDUR) 30 MG 24 hr tablet TAKE 1 TABLET(30 MG) BY MOUTH DAILY  . levothyroxine (SYNTHROID) 125 MCG tablet Take 1 tab by mouth every morning( on empty stomach) 30 minutes before taking other medications or before breakfast  . omeprazole (PRILOSEC) 40 MG capsule Take 1 capsule (40 mg total) by mouth daily as needed (acid reflux).  . rivaroxaban (XARELTO) 20 MG TABS tablet Take 1 tablet (20 mg total) by mouth daily with supper.   . sacubitril-valsartan (ENTRESTO) 49-51 MG Take 1 tablet by mouth 2 (two) times daily.  Marland Kitchen spironolactone (ALDACTONE) 50 MG tablet TAKE 1 TABLET(50 MG) BY MOUTH DAILY     Allergies:   Ibuprofen   Social History   Socioeconomic History  . Marital status: Divorced    Spouse name: Not on file  . Number of children: 1  . Years of education: Not on file  . Highest education level: Not on file  Occupational History  . Occupation: Retired    Fish farm manager: RETIRED    CommentAdvertising copywriter  Tobacco Use  . Smoking status: Light Tobacco Smoker    Packs/day: 0.25    Years: 40.00    Pack years: 10.00    Types: Cigarettes  . Smokeless tobacco: Never Used  . Tobacco comment: on and off   Vaping Use  . Vaping Use: Never used  Substance and Sexual Activity  . Alcohol use: Yes    Alcohol/week: 0.0 standard drinks    Comment: rare glass of wine  . Drug use: No  . Sexual activity: Not Currently    Birth control/protection: None  Other Topics Concern  . Not on file  Social History Narrative   Retired Education officer, museum   Divorced - one grown son   current smoker    Alcohol use-no      Drug use-no    Regular exercise- no     son - Paula Stein   Social Determinants of Health   Financial Resource Strain: Low Risk   . Difficulty of Paying Living Expenses: Not hard at all  Food Insecurity: Not on file  Transportation Needs: No Transportation Needs  . Lack of Transportation (Medical): No  . Lack of Transportation (Non-Medical): No  Physical Activity: Inactive  . Days of Exercise per Week: 0 days  . Minutes of Exercise per Session: 0 min  Stress: No Stress Concern Present  . Feeling of Stress : Not at all  Social Connections: Moderately Integrated  . Frequency of Communication with Friends and Family: More than three times a week  . Frequency of Social Gatherings with Friends and Family: More than three times a week  . Attends Religious Services: More than 4 times per year  . Active  Member of Clubs or Organizations: Yes  . Attends Archivist Meetings: 1 to 4 times per year  . Marital Status: Divorced     Family History: The patient's family history includes Diabetes in her mother; Diabetes type II in her mother; Heart disease in her mother; Hyperlipidemia in her father; Hypertension in her mother; Other in her father. There is no history of Breast cancer, Stomach cancer, Colon cancer, Esophageal cancer, or Pancreatic cancer.  ROS:   Please  see the history of present illness.    Positive for gouty arthritis of the left ankle.  All other systems reviewed and are negative.  EKGs/Labs/Other Studies Reviewed:    The following studies were reviewed today: Echo 06/04/2018: IMPRESSIONS    1. The left ventricle has moderate-severely reduced systolic function,  with an ejection fraction of 30-35%. The cavity size was mildly dilated.  There is mild concentric left ventricular hypertrophy. Left ventrical  global hypokinesis without regional  wall motion abnormalities.  2. Left ventricular diastolic Doppler parameters are consistent with  pseudonormalization. Elevated mean left atrial pressure.  3. The right ventricle has moderately reduced systolic function. The  cavity size was normal.  4. Left atrial size was moderately dilated.  5. Trivial pericardial effusion is present.  6. Mild to moderate mitral valve regurgitation, with a centrally-directed  jet.  7. Moderate diffuse plaque is seen in the aortic arch.  8. When compared to the prior study: 03/13/2018, no significant change.   EKG:  EKG is not ordered today.    Recent Labs: 03/15/2020: ALT 11 03/17/2020: Magnesium 2.0 03/31/2020: BUN 22; Creat 1.46; Hemoglobin 13.4; Platelets 299.0; Potassium 4.9; Sodium 140; TSH 5.23  Recent Lipid Panel    Component Value Date/Time   CHOL 228 (H) 02/06/2019 1052   TRIG 165 (H) 02/06/2019 1052   HDL 91 02/06/2019 1052   CHOLHDL 2.5 02/06/2019 1052    CHOLHDL 3 04/13/2017 0918   VLDL 24.4 04/13/2017 0918   LDLCALC 109 (H) 02/06/2019 1052   LDLDIRECT 195.2 02/15/2011 1625     Risk Assessment/Calculations:       Physical Exam:    VS:  BP (!) 150/90   Pulse 73   Ht $R'5\' 10"'ha$  (1.778 m)   Wt 212 lb 9.6 oz (96.4 kg)   SpO2 99%   BMI 30.50 kg/m     Wt Readings from Last 3 Encounters:  07/07/20 212 lb 9.6 oz (96.4 kg)  05/05/20 214 lb (97.1 kg)  04/27/20 210 lb 4 oz (95.4 kg)     GEN:  Well nourished, well developed in no acute distress HEENT: Normal NECK: No JVD; No carotid bruits LYMPHATICS: No lymphadenopathy CARDIAC: RRR, no murmurs, rubs, gallops RESPIRATORY:  Clear to auscultation without rales, wheezing or rhonchi  ABDOMEN: Soft, non-tender, non-distended MUSCULOSKELETAL:  No edema; No deformity  SKIN: Warm and dry NEUROLOGIC:  Alert and oriented x 3 PSYCHIATRIC:  Normal affect   ASSESSMENT:    1. Chronic combined systolic and diastolic CHF (congestive heart failure) (HCC)   2. Stage 3b chronic kidney disease ([place])   3. Essential hypertension    PLAN:    In order of problems listed above:  1. Appears stable.  Has had severe concentric LVH as well as severe LV dysfunction with only mild LV dilatation in the past.  Last echo 2 years ago.  Recommend updated echocardiogram to reassess.  Reviewed medication dosing schedule with her at length.  Emphasized the importance of taking carvedilol and Entresto twice daily. 2. Most recent labs reviewed from December 2021 with creatinine 1.46.  Patient on appropriate treatment. 3. Difficult to know blood pressure control at present.  She has not taken any medications today.  She will begin to separate her medicines as detailed above and will follow her blood pressure.  Will update an echocardiogram.  Labs reviewed today as part of her evaluation.  Kidney function is demonstrated to be stable.  Medication Adjustments/Labs and Tests Ordered: Current medicines are reviewed at  length with the patient today.  Concerns regarding medicines are outlined above.  Orders Placed This Encounter  Procedures  . ECHOCARDIOGRAM COMPLETE   No orders of the defined types were placed in this encounter.   Patient Instructions  Medication Instructions:  1) Make sure to take your Entresto 49/51 twice daily 2) Make sure to take your Coreg 25 mg twice daily  *If you need a refill on your cardiac medications before your next appointment, please call your pharmacy*  Testing/Procedures: Your provider has requested that you have an echocardiogram. Echocardiography is a painless test that uses sound waves to create images of your heart. It provides your doctor with information about the size and shape of your heart and how well your heart's chambers and valves are working. This procedure takes approximately one hour. There are no restrictions for this procedure.  Follow-Up: At [facility], you and your health needs are our priority.  As part of our continuing mission to provide you with exceptional heart care, we have created designated Provider Care Teams.  These Care Teams include your primary Cardiologist (physician) and Advanced Practice Providers (APPs -  Physician Assistants and Nurse Practitioners) who all work together to provide you with the care you need, when you need it. Your next appointment:   12 month(s) The format for your next appointment:   In Person Provider:   You may see [name], MD or one of the following Advanced Practice Providers on your designated Care Team:    Richardson Dopp, PA-C  Robbie Lis, [place]      Signed, [name], MD  07/07/2020 12:01 PM    [place]

## 2020-07-07 NOTE — Patient Instructions (Signed)
Medication Instructions:  1) Make sure to take your Entresto 49/51 twice daily 2) Make sure to take your Coreg 25 mg twice daily  *If you need a refill on your cardiac medications before your next appointment, please call your pharmacy*  Testing/Procedures: Your provider has requested that you have an echocardiogram. Echocardiography is a painless test that uses sound waves to create images of your heart. It provides your doctor with information about the size and shape of your heart and how well your heart's chambers and valves are working. This procedure takes approximately one hour. There are no restrictions for this procedure.  Follow-Up: At [facility], you and your health needs are our priority.  As part of our continuing mission to provide you with exceptional heart care, we have created designated Provider Care Teams.  These Care Teams include your primary Cardiologist (physician) and Advanced Practice Providers (APPs -  Physician Assistants and Nurse Practitioners) who all work together to provide you with the care you need, when you need it. Your next appointment:   12 month(s) The format for your next appointment:   In Person Provider:   You may see [name], MD or one of the following Advanced Practice Providers on your designated Care Team:    Richardson Dopp, PA-C  [place], [place]

## 2020-07-23 ENCOUNTER — Encounter: Payer: Self-pay | Admitting: Family Medicine

## 2020-07-23 MED ORDER — CARVEDILOL 25 MG PO TABS: 25.0000 mg | ORAL_TABLET | Freq: Two times a day (BID) | ORAL | 1 refills | Status: DC

## 2020-07-23 MED ORDER — ENTRESTO 49-51 MG PO TABS: 1.0000 | ORAL_TABLET | Freq: Two times a day (BID) | ORAL | 1 refills | Status: DC

## 2020-07-26 ENCOUNTER — Other Ambulatory Visit: Payer: Self-pay

## 2020-07-26 DIAGNOSIS — M1A0790 Idiopathic chronic gout, unspecified ankle and foot, without tophus (tophi): Secondary | ICD-10-CM

## 2020-07-26 MED ORDER — ALLOPURINOL 100 MG PO TABS: 100.0000 mg | ORAL_TABLET | Freq: Every day | ORAL | 3 refills | Status: DC

## 2020-07-26 MED ORDER — GLIPIZIDE ER 2.5 MG PO TB24: 2.5000 mg | ORAL_TABLET | Freq: Every day | ORAL | 3 refills | Status: DC

## 2020-07-30 ENCOUNTER — Ambulatory Visit (INDEPENDENT_AMBULATORY_CARE_PROVIDER_SITE_OTHER): Payer: Medicare PPO

## 2020-07-30 DIAGNOSIS — I428 Other cardiomyopathies: Secondary | ICD-10-CM | POA: Diagnosis not present

## 2020-07-30 LAB — CUP PACEART REMOTE DEVICE CHECK
Battery Remaining Percentage: 93 %
Date Time Interrogation Session: 20220429074600
Implantable Lead Implant Date: 20160707
Implantable Lead Location: 753862
Implantable Lead Model: 3401
Implantable Pulse Generator Implant Date: 20210813
Pulse Gen Serial Number: 143555

## 2020-08-02 ENCOUNTER — Other Ambulatory Visit: Payer: Self-pay | Admitting: Internal Medicine

## 2020-08-12 ENCOUNTER — Ambulatory Visit (HOSPITAL_COMMUNITY): Payer: Medicare PPO | Attending: Cardiology

## 2020-08-12 ENCOUNTER — Other Ambulatory Visit: Payer: Self-pay

## 2020-08-12 DIAGNOSIS — I5042 Chronic combined systolic (congestive) and diastolic (congestive) heart failure: Secondary | ICD-10-CM | POA: Insufficient documentation

## 2020-08-12 LAB — ECHOCARDIOGRAM COMPLETE
Area-P 1/2: 4.21 cm2
MV M vel: 5.43 m/s
MV Peak grad: 117.9 mmHg
S' Lateral: 4.3 cm

## 2020-08-19 NOTE — Progress Notes (Signed)
Remote ICD transmission.   

## 2020-09-06 ENCOUNTER — Encounter: Payer: Self-pay | Admitting: Oncology

## 2020-09-07 NOTE — Telephone Encounter (Signed)
Lm on vm for patient to return call 

## 2020-09-08 NOTE — Telephone Encounter (Signed)
Lm on vm for patient to return call 

## 2020-09-10 ENCOUNTER — Telehealth: Payer: Self-pay

## 2020-09-10 NOTE — Telephone Encounter (Signed)
Patient with diagnosis of portal vein thrombosis on Xarelto for anticoagulation.    Procedure: colonoscopy Date of procedure: 10/13/20  CrCl 40.58 ml/min Platelet count 299  Patient has now completed 6 months of uninterrupted anticoagulation. The plan is for life long anticoagulation. MD is requesting a 2 day hold. I will make sure with hematology is ok with a 2 day Xarelto hold.

## 2020-09-10 NOTE — Telephone Encounter (Signed)
[facility] Pre-operative Risk Assessment     Request for surgical clearance:     Endoscopy Procedure  What type of surgery is being performed?     Colonoscopy   When is this surgery scheduled?     10/13/2020  What type of clearance is required ?   Pharmacy  Are there any medications that need to be held prior to surgery and how long? Xarelto - 2 days  Practice name and name of physician performing surgery?      [facility]  What is your office phone and fax number?      Phone- [phone]  [phone]  Anesthesia type (None, local, MAC, general) ?       MAC

## 2020-09-15 NOTE — Telephone Encounter (Signed)
    Paula Stein DOB:  01/09/1946  MRN:  [id number]   Primary Cardiologist: [name], MD  Chart reviewed as part of pre-operative protocol coverage. Given past medical history and time since last visit, based on ACC/AHA guidelines, Paula Stein would be at acceptable risk for the planned procedure without further cardiovascular testing.   Patient with diagnosis of portal vein thrombosis on Xarelto for anticoagulation.     Procedure: colonoscopy Date of procedure: 10/13/20   CrCl 40.58 ml/min Platelet count 299   Patient has now completed 6 months of uninterrupted anticoagulation. The plan is for life long anticoagulation. MD is requesting a 2 day hold. Case discussed with [name] who agrees that the patient may hold Xarelto for 2 days prior to procedure.   I will route this recommendation to the requesting party via Epic fax function and remove from pre-op pool.  Please call with questions.  Kathyrn Drown, NP 09/15/2020, 7:54 AM

## 2020-10-05 ENCOUNTER — Ambulatory Visit (AMBULATORY_SURGERY_CENTER): Payer: Medicare PPO | Admitting: *Deleted

## 2020-10-05 ENCOUNTER — Encounter: Payer: Self-pay | Admitting: Family Medicine

## 2020-10-05 VITALS — Ht 70.0 in | Wt 219.0 lb

## 2020-10-05 DIAGNOSIS — M1A0790 Idiopathic chronic gout, unspecified ankle and foot, without tophus (tophi): Secondary | ICD-10-CM

## 2020-10-05 DIAGNOSIS — Z8719 Personal history of other diseases of the digestive system: Secondary | ICD-10-CM

## 2020-10-05 MED ORDER — NA SULFATE-K SULFATE-MG SULF 17.5-3.13-1.6 GM/177ML PO SOLN: 1.0000 | Freq: Once | ORAL | 0 refills | Status: AC

## 2020-10-05 NOTE — Progress Notes (Signed)
No egg or soy allergy known to patient  No issues with past sedation with any surgeries or procedures Patient denies ever being told they had issues or difficulty with intubation  No FH of Malignant Hyperthermia No diet pills per patient No home 02 use per patient   Pt takes Xarelto and was instructed to hold for 2 days with the last dose on the evening of 7/10.    Pt denies issues with constipation  No A fib or A flutter  EMMI video to pt or via [place] 19 guidelines implemented in PV today with Pt and RN   Due to the COVID-19 pandemic we are asking patients to follow certain guidelines.  Pt aware of COVID protocols and [facility] guidelines

## 2020-10-06 MED ORDER — ALLOPURINOL 100 MG PO TABS: 100.0000 mg | ORAL_TABLET | Freq: Every day | ORAL | 1 refills | Status: DC

## 2020-10-07 NOTE — Progress Notes (Signed)
Attempted to obtain medical history via telephone, unable to reach at this time. I left a voicemail to return pre surgical testing department's phone call.  

## 2020-10-12 NOTE — Anesthesia Preprocedure Evaluation (Addendum)
Anesthesia Evaluation  Patient identified by MRN, date of birth, ID band Patient awake    Reviewed: Allergy & Precautions, NPO status , Patient's Chart, lab work & pertinent test results  Airway Mallampati: II  TM Distance: >3 FB Neck ROM: Full    Dental no notable dental hx. (+) Edentulous Upper, Dental Advisory Given   Pulmonary shortness of breath, sleep apnea , Current Smoker and Patient abstained from smoking.,    Pulmonary exam normal breath sounds clear to auscultation       Cardiovascular hypertension, + CAD, + Peripheral Vascular Disease and +CHF  Normal cardiovascular exam+ Cardiac Defibrillator  Rhythm:Regular Rate:Normal  08/12/20 TTE 1. Left ventricular ejection fraction, by estimation, is 30 to 35%. The  left ventricle has moderately decreased function. The left ventricle  demonstrates global hypokinesis. There is severe asymmetric left  ventricular hypertrophy of the basal-septal  segment. Left ventricular diastolic parameters are indeterminate.  2. Right ventricular systolic function is moderately reduced. The right  ventricular size is normal. Tricuspid regurgitation signal is inadequate  for assessing PA pressure.  3. The mitral valve is normal in structure. Mild mitral valve  regurgitation. No evidence of mitral stenosis.  4. The aortic valve was not well visualized. Aortic valve regurgitation  is not visualized. No aortic stenosis is present.  5. The inferior vena cava is normal in size with greater than 50%  respiratory variability, suggesting right atrial pressure of 3 mmHg.    Neuro/Psych    GI/Hepatic hiatal hernia, GERD  ,  Endo/Other  diabetes, Type 2Hypothyroidism   Renal/GU Renal InsufficiencyRenal disease     Musculoskeletal   Abdominal   Peds  Hematology   Anesthesia Other Findings   Reproductive/Obstetrics                            Anesthesia  Physical Anesthesia Plan  ASA: 4  Anesthesia Plan: MAC   Post-op Pain Management:    Induction:   PONV Risk Score and Plan: Treatment may vary due to age or medical condition  Airway Management Planned: Natural Airway  Additional Equipment: None  Intra-op Plan:   Post-operative Plan:   Informed Consent: I have reviewed the patients History and Physical, chart, labs and discussed the procedure including the risks, benefits and alternatives for the proposed anesthesia with the patient or authorized representative who has indicated his/her understanding and acceptance.     Dental advisory given  Plan Discussed with: CRNA  Anesthesia Plan Comments: (colonoscopy)       Anesthesia Quick Evaluation

## 2020-10-13 ENCOUNTER — Ambulatory Visit (HOSPITAL_COMMUNITY)
Admission: RE | Admit: 2020-10-13 | Discharge: 2020-10-13 | Disposition: A | Discharge status: Home / Self Care | Payer: Medicare PPO | Attending: Gastroenterology | Admitting: Gastroenterology

## 2020-10-13 ENCOUNTER — Other Ambulatory Visit: Payer: Self-pay

## 2020-10-13 ENCOUNTER — Encounter: Payer: Self-pay | Admitting: Family Medicine

## 2020-10-13 ENCOUNTER — Encounter (HOSPITAL_COMMUNITY)
Admission: RE | Disposition: A | Discharge status: Home / Self Care | Payer: Self-pay | Source: Home / Self Care | Attending: Gastroenterology

## 2020-10-13 ENCOUNTER — Encounter (HOSPITAL_COMMUNITY): Payer: Self-pay | Admitting: Gastroenterology

## 2020-10-13 ENCOUNTER — Ambulatory Visit (HOSPITAL_COMMUNITY): Payer: Medicare PPO | Admitting: Anesthesiology

## 2020-10-13 DIAGNOSIS — E890 Postprocedural hypothyroidism: Secondary | ICD-10-CM | POA: Insufficient documentation

## 2020-10-13 DIAGNOSIS — K5520 Angiodysplasia of colon without hemorrhage: Secondary | ICD-10-CM | POA: Diagnosis not present

## 2020-10-13 DIAGNOSIS — F17200 Nicotine dependence, unspecified, uncomplicated: Secondary | ICD-10-CM | POA: Diagnosis not present

## 2020-10-13 DIAGNOSIS — I110 Hypertensive heart disease with heart failure: Secondary | ICD-10-CM | POA: Insufficient documentation

## 2020-10-13 DIAGNOSIS — K5792 Diverticulitis of intestine, part unspecified, without perforation or abscess without bleeding: Secondary | ICD-10-CM | POA: Diagnosis present

## 2020-10-13 DIAGNOSIS — D124 Benign neoplasm of descending colon: Secondary | ICD-10-CM | POA: Insufficient documentation

## 2020-10-13 DIAGNOSIS — D120 Benign neoplasm of cecum: Secondary | ICD-10-CM | POA: Insufficient documentation

## 2020-10-13 DIAGNOSIS — Z09 Encounter for follow-up examination after completed treatment for conditions other than malignant neoplasm: Secondary | ICD-10-CM | POA: Diagnosis not present

## 2020-10-13 DIAGNOSIS — I509 Heart failure, unspecified: Secondary | ICD-10-CM | POA: Insufficient documentation

## 2020-10-13 DIAGNOSIS — K5730 Diverticulosis of large intestine without perforation or abscess without bleeding: Secondary | ICD-10-CM

## 2020-10-13 DIAGNOSIS — K621 Rectal polyp: Secondary | ICD-10-CM | POA: Insufficient documentation

## 2020-10-13 DIAGNOSIS — Z95810 Presence of automatic (implantable) cardiac defibrillator: Secondary | ICD-10-CM | POA: Insufficient documentation

## 2020-10-13 DIAGNOSIS — K635 Polyp of colon: Secondary | ICD-10-CM | POA: Diagnosis not present

## 2020-10-13 DIAGNOSIS — D122 Benign neoplasm of ascending colon: Secondary | ICD-10-CM | POA: Insufficient documentation

## 2020-10-13 DIAGNOSIS — R1030 Lower abdominal pain, unspecified: Secondary | ICD-10-CM | POA: Diagnosis present

## 2020-10-13 DIAGNOSIS — Z886 Allergy status to analgesic agent status: Secondary | ICD-10-CM | POA: Insufficient documentation

## 2020-10-13 DIAGNOSIS — K5732 Diverticulitis of large intestine without perforation or abscess without bleeding: Secondary | ICD-10-CM | POA: Diagnosis not present

## 2020-10-13 DIAGNOSIS — E1151 Type 2 diabetes mellitus with diabetic peripheral angiopathy without gangrene: Secondary | ICD-10-CM | POA: Diagnosis not present

## 2020-10-13 HISTORY — PX: POLYPECTOMY: SHX5525

## 2020-10-13 HISTORY — PX: COLONOSCOPY WITH PROPOFOL: SHX5780

## 2020-10-13 HISTORY — PX: BIOPSY: SHX5522

## 2020-10-13 LAB — GLUCOSE, CAPILLARY
Glucose-Capillary: 133 mg/dL — ABNORMAL HIGH (ref 70–99)
Glucose-Capillary: 113 mg/dL — ABNORMAL HIGH (ref 70–99)

## 2020-10-13 SURGERY — COLONOSCOPY WITH PROPOFOL
Anesthesia: Monitor Anesthesia Care

## 2020-10-13 MED ORDER — EPHEDRINE SULFATE-NACL 50-0.9 MG/10ML-% IV SOSY
PREFILLED_SYRINGE | INTRAVENOUS | Status: DC | PRN
  Administered 2020-10-13: 10 mg via INTRAVENOUS

## 2020-10-13 MED ORDER — ONDANSETRON HCL 4 MG/2ML IJ SOLN
INTRAMUSCULAR | Status: DC | PRN
  Administered 2020-10-13: 4 mg via INTRAVENOUS

## 2020-10-13 MED ORDER — PROPOFOL 500 MG/50ML IV EMUL
INTRAVENOUS | Status: DC | PRN
  Administered 2020-10-13: 130 ug/kg/min via INTRAVENOUS

## 2020-10-13 MED ORDER — PROPOFOL 1000 MG/100ML IV EMUL
INTRAVENOUS | Status: AC
  Filled 2020-10-13: qty 100

## 2020-10-13 MED ORDER — LACTATED RINGERS IV SOLN: INTRAVENOUS | Status: DC | PRN

## 2020-10-13 MED ORDER — PROPOFOL 10 MG/ML IV BOLUS
INTRAVENOUS | Status: DC | PRN
  Administered 2020-10-13: 10 mg via INTRAVENOUS

## 2020-10-13 MED ORDER — SODIUM CHLORIDE 0.9 % IV SOLN: INTRAVENOUS | Status: DC

## 2020-10-13 MED ORDER — PHENYLEPHRINE 40 MCG/ML (10ML) SYRINGE FOR IV PUSH (FOR BLOOD PRESSURE SUPPORT)
PREFILLED_SYRINGE | INTRAVENOUS | Status: DC | PRN
  Administered 2020-10-13: 80 ug via INTRAVENOUS
  Administered 2020-10-13: 120 ug via INTRAVENOUS
  Administered 2020-10-13: 80 ug via INTRAVENOUS
  Administered 2020-10-13: 120 ug via INTRAVENOUS

## 2020-10-13 MED ORDER — DICYCLOMINE HCL 10 MG PO CAPS: 10.0000 mg | ORAL_CAPSULE | Freq: Four times a day (QID) | ORAL | 1 refills | Status: DC | PRN

## 2020-10-13 SURGICAL SUPPLY — 22 items

## 2020-10-13 NOTE — Anesthesia Procedure Notes (Signed)
Procedure Name: MAC Date/Time: 10/13/2020 10:35 AM Performed by: Niel Hummer, CRNA Pre-anesthesia Checklist: Patient identified and Patient being monitored Oxygen Delivery Method: Simple face mask

## 2020-10-13 NOTE — Transfer of Care (Signed)
Immediate Anesthesia Transfer of Care Note  Patient: Paula Stein  Procedure(s) Performed: COLONOSCOPY WITH PROPOFOL POLYPECTOMY BIOPSY  Patient Location: PACU and Endoscopy Unit  Anesthesia Type:MAC  Level of Consciousness: awake, alert , oriented and patient cooperative  Airway & Oxygen Therapy: Patient Spontanous Breathing and Patient connected to face mask oxygen  Post-op Assessment: Report given to RN, Post -op Vital signs reviewed and stable and Patient moving all extremities  Post vital signs: Reviewed and stable  Last Vitals:  Vitals Value Taken Time  BP 95/43 10/13/20 1154  Temp    Pulse 73 10/13/20 1153  Resp 17 10/13/20 1155  SpO2 77 % 10/13/20 1153  Vitals shown include unvalidated device data.  Last Pain:  Vitals:   10/13/20 0942  TempSrc: Oral  PainSc: 0-No pain         Complications: No notable events documented.

## 2020-10-13 NOTE — Discharge Instructions (Signed)

## 2020-10-13 NOTE — H&P (Signed)
Chief Complaint: History of diverticulitis    HPI:     Paula Stein is a 75 y.o. female with a past medical history of AICD placement due to CHF (last echo 06/04/2018 with an ejection fraction of 30 to 35%), PAD, diabetes, HTN, iatrogenic thyroiditis, thyroidectomy, hysterectomy, presenting to [facility] long hospital endoscopy unit today for colonoscopy.  She was last seen in the GI clinic on 05/05/2020 by [name] as follow-up for hospitalization for acute sigmoid diverticulitis c/b right portal vein thrombus.  History from that appointment as outlined below:   "08/22/2004 history of colonoscopy.  We cannot see report in computer but patient tells me she thinks this may have been done in [place] [facility].    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  She was initially started on heparin drip and switched to oral Xarelto and improved with antibiotics.  Was recommended she follow with [place] for possible colonoscopy.    She stopped her antibiotics just a couple of days early because it was giving her severe itching, but she has no further abdominal pain or symptoms.  She is very thankful for that.  Tells me she and [name] discussed the Xarelto and she is going to stay on this long-term."  Today, she states no change in medical/surgical history since her last appointment in GI clinic.  Last dose of Xarelto was 2 days ago.   Past Medical History:  Diagnosis Date   AICD (automatic cardioverter/defibrillator) present 10/08/2014   SUBQ    /    DR [name]   Cataract    CHF (congestive heart failure) (HCC)    Diabetes mellitus without complication ([name])    GERD (gastroesophageal reflux disease)    History of cardiac cath 05/05/2007   normal-with patent coronaries   History of colonoscopy    History of hiatal hernia    Hyperlipidemia    Hypertension    Iatrogenic thyroiditis    Non-ischemic  cardiomyopathy (HCC)    EF 28%- reassessment of LV function 2011 with LVEf 45-50%   PAD (peripheral artery disease) (HCC)    lower extremities with ABIs of 0.5 bilaterally   Personal history of goiter    S/P radioactive iodine thyroid ablation    S/P thyroidectomy    Shortness of breath dyspnea    Sleep apnea    uses CPAP     Past Surgical History:  Procedure Laterality Date   CATARACT EXTRACTION Bilateral    EP IMPLANTABLE DEVICE N/A 10/08/2014   Procedure: SubQ ICD Implant;  Surgeon: [name], MD;  Location: [place] [facility];  Service: Cardiovascular;  Laterality: N/A;   SUBQ ICD CHANGEOUT N/A 11/14/2019   Procedure: [place];  Surgeon: [name], MD;  Location: [place] [facility];  Service: Cardiovascular;  Laterality: N/A;   THYROIDECTOMY     TONSILLECTOMY     TOTAL ABDOMINAL HYSTERECTOMY     Family History  Problem Relation Age of Onset   Diabetes type II Mother    Hypertension Mother    Diabetes Mother    Heart disease Mother    Other Father        deceased from accident age 71   Hyperlipidemia Father    Breast cancer Neg Hx    Stomach cancer Neg Hx    Colon cancer Neg Hx    Esophageal cancer Neg Hx    Pancreatic cancer Neg Hx  Social History   Tobacco Use   Smoking status: Light Smoker    Packs/day: 0.25    Years: 40.00    Pack years: 10.00    Types: Cigarettes   Smokeless tobacco: Never   Tobacco comments:    on and off,currently a pack / week ( 10/05/20)  Vaping Use   Vaping Use: Never used  Substance Use Topics   Alcohol use: Yes    Alcohol/week: 0.0 standard drinks    Comment: rare glass of wine   Drug use: No   Current Facility-Administered Medications  Medication Dose Route Frequency Provider Last Rate Last Admin   0.9 %  sodium chloride infusion   Intravenous Continuous [name], DO       Allergies  Allergen Reactions   Ibuprofen Nausea Only and Other (See Comments)     Review of Systems: All systems  reviewed and negative except where noted in HPI.     Physical Exam:    Wt Readings from Last 3 Encounters:  10/05/20 99.3 kg  07/07/20 96.4 kg  05/05/20 97.1 kg    There were no vitals taken for this visit. Constitutional:  Pleasant, in no acute distress. Psychiatric: Normal mood and affect. Behavior is normal. EENT: Pupils normal.  Conjunctivae are normal. No scleral icterus. Neck supple. No cervical LAD. Cardiovascular: Normal rate, regular rhythm. No edema Pulmonary/chest: Effort normal and breath sounds normal. No wheezing, rales or rhonchi. Abdominal: Soft, nondistended, nontender. Bowel sounds active throughout. There are no masses palpable. No hepatomegaly. Neurological: Alert and oriented to person place and time. Skin: Skin is warm and dry. No rashes noted.   ASSESSMENT AND PLAN;   1) History of diverticulitis 2) Colon cancer screening - Colonoscopy today  3) CHF with reduced ejection fraction 4) History of portal vein thrombus 5) Systemic anticoagulation - Procedure scheduled at [facility] due to elevated periprocedural risks - Has been holding Xarelto 2 days.  Will provide timing to resume following completion of colonoscopy today   [name], DO, FACG  10/13/2020, 9:32 AM   No ref. provider found

## 2020-10-13 NOTE — Interval H&P Note (Signed)
History and Physical Interval Note:  10/13/2020 10:33 AM  Paula Stein  has presented today for surgery, with the diagnosis of History of Diverticulitis.  The various methods of treatment have been discussed with the patient and family. After consideration of risks, benefits and other options for treatment, the patient has consented to  Procedure(s): COLONOSCOPY WITH PROPOFOL (N/A) as a surgical intervention.  The patient's history has been reviewed, patient examined, no change in status, stable for surgery.  I have reviewed the patient's chart and labs.  Questions were answered to the patient's satisfaction.     Dominic Pea Laraina Sulton

## 2020-10-13 NOTE — Anesthesia Postprocedure Evaluation (Signed)
Anesthesia Post Note  Patient: Paula Stein  Procedure(s) Performed: COLONOSCOPY WITH PROPOFOL POLYPECTOMY BIOPSY     Patient location during evaluation: PACU Anesthesia Type: MAC Level of consciousness: awake and alert Pain management: pain level controlled Vital Signs Assessment: post-procedure vital signs reviewed and stable Respiratory status: spontaneous breathing, nonlabored ventilation, respiratory function stable and patient connected to nasal cannula oxygen Cardiovascular status: stable and blood pressure returned to baseline Postop Assessment: no apparent nausea or vomiting Anesthetic complications: no   No notable events documented.  Last Vitals:  Vitals:   10/13/20 1230 10/13/20 1240  BP: (!) 143/80 (!) 153/73  Pulse: 77 68  Resp: 20 14  Temp:    SpO2: 96% 94%    Last Pain:  Vitals:   10/13/20 1240  TempSrc:   PainSc: 7                  Barnet Glasgow

## 2020-10-13 NOTE — Op Note (Signed)
[facility] Patient Name: Paula Stein Procedure Date: 10/13/2020 MRN: [id number] Attending MD: Gerrit Heck , MD Date of Birth: 1945/10/08 CSN: [id number] Age: 75 Admit Type: Outpatient Procedure:                Colonoscopy Indications:              Lower abdominal pain, Follow-up of diverticulitis Providers:                Gerrit Heck, MD, Kary Kos RN, RN, Terrall Laity, Elspeth Cho Tech., Technician, Courtney Heys                            [place], CRNA Referring MD:              Medicines:                Monitored Anesthesia Care Complications:            No immediate complications. Estimated Blood Loss:     Estimated blood loss was minimal. Procedure:                Pre-Anesthesia Assessment:                           - Prior to the procedure, a History and Physical                            was performed, and patient medications and                            allergies were reviewed. The patient's tolerance of                            previous anesthesia was also reviewed. The risks                            and benefits of the procedure and the sedation                            options and risks were discussed with the patient.                            All questions were answered, and informed consent                            was obtained. Prior Anticoagulants: The patient has                            taken Xarelto (rivaroxaban), last dose was 2 days                            prior to procedure. ASA Grade Assessment: III - A  patient with severe systemic disease. After                            reviewing the risks and benefits, the patient was                            deemed in satisfactory condition to undergo the                            procedure.                           After obtaining informed consent, the colonoscope                            was passed under direct  vision. Throughout the                            procedure, the patient's blood pressure, pulse, and                            oxygen saturations were monitored continuously. The                            PCF-H190DL ([id number]) Olympus pediatric colonscope                            was introduced through the anus and advanced to the                            the cecum, identified by appendiceal orifice and                            ileocecal valve. The colonoscopy was technically                            difficult and complex due to significant looping.                            Successful completion of the procedure was aided by                            applying abdominal pressure. The patient tolerated                            the procedure well. The quality of the bowel                            preparation was adequate. The ileocecal valve,                            appendiceal orifice, and rectum were photographed. Scope In: 10:42:19 AM Scope Out: 11:37:32 AM Scope Withdrawal Time: 0 hours 48 minutes 40 seconds  Total Procedure Duration: 0 hours 55 minutes  13 seconds  Findings:      The perianal and digital rectal examinations were normal.      Six sessile polyps were found in the descending colon (4), ascending       colon (2) and cecum (2). The polyps were 4 to 8 mm in size. These polyps       were removed with a cold snare. Resection and retrieval were complete.       Estimated blood loss was minimal.      Many small and large-mouthed diverticula were found in the sigmoid       colon. There was significant mucosal hypertrophy and moderate erythema       co-located in a dense field of diverticulosis. There was significant       spasm in this area. Biopsies were taken with a cold forceps for       histology. Estimated blood loss was minimal.      Many sessile polyps were found in the rectum and sigmoid colon. The       polyps were 2 to 4 mm in size. Over ten of these  polyps were removed       with a cold biopsy forceps for histologic representative evaluation.       Resection and retrieval were complete. Estimated blood loss was minimal.      A 15 mm polyp was found in the distal rectum. The polyp was sessile. The       polyp was removed with a hot snare. Resection and retrieval were       complete. Estimated blood loss: none.      The sigmoid colon and ascending colon revealed significantly excessive       looping. Advancing the scope required using manual pressure.      Two medium-sized angioectasias without bleeding were found in the       ascending colon and in the cecum. Impression:               - Six 4 to 8 mm polyps in the descending colon, in                            the ascending colon and in the cecum, removed with                            a cold snare. Resected and retrieved.                           - Diverticulosis in the sigmoid colon. Biopsied.                           - Many 2 to 4 mm polyps in the rectum and in the                            sigmoid colon, removed with a cold biopsy forceps.                            Many of these polyps were resected and retrieved.                           - One 15 mm polyp  in the distal rectum, removed                            with a hot snare. Resected and retrieved.                           - There was significant looping of the colon.                           - Two non-bleeding colonic angioectasias. Moderate Sedation:      Not Applicable - Patient had care per Anesthesia. Recommendation:           - Patient has a contact number available for                            emergencies. The signs and symptoms of potential                            delayed complications were discussed with the                            patient. Return to normal activities tomorrow.                            Written discharge instructions were provided to the                            patient.                            - Resume previous diet.                           - Continue present medications.                           - Await pathology results.                           - Depending on the pathology results, given the                            extensive polyp burden, may plan for repeat                            colonoscopy in 6 months for short interval                            surveillance.                           - Depending on biopsy results from the sigmoid                            colon, can consider course of 5-ASA therapy for  possible SCAD.                           - Trial course of Bentyl 10 mg PO prn Q6 hours for                            abdominal pain/spasm.                           - Return to GI office at appointment to be                            scheduled. Procedure Code(s):        --- Professional ---                           [phone], Colonoscopy, flexible; with removal of                            tumor(s), polyp(s), or other lesion(s) by snare                            technique                           [id number], 39, Colonoscopy, flexible; with biopsy,                            single or multiple Diagnosis Code(s):        --- Professional ---                           K62.1, Rectal polyp                           K63.5, Polyp of colon                           K55.20, Angiodysplasia of colon without hemorrhage                           R10.30, Lower abdominal pain, unspecified                           K57.32, Diverticulitis of large intestine without                            perforation or abscess without bleeding                           K57.30, Diverticulosis of large intestine without                            perforation or abscess without bleeding CPT copyright 2019 [employer]. All rights reserved. The codes documented in this report are preliminary and upon coder review may  be revised to  meet current compliance requirements. Gerrit Heck, MD 10/13/2020 11:49:57 AM Number  of Addenda: 0

## 2020-10-14 LAB — SURGICAL PATHOLOGY

## 2020-10-15 ENCOUNTER — Telehealth (INDEPENDENT_AMBULATORY_CARE_PROVIDER_SITE_OTHER): Payer: Medicare PPO | Admitting: Family Medicine

## 2020-10-15 ENCOUNTER — Encounter: Payer: Self-pay | Admitting: Family Medicine

## 2020-10-15 VITALS — Ht 70.0 in

## 2020-10-15 DIAGNOSIS — J069 Acute upper respiratory infection, unspecified: Secondary | ICD-10-CM | POA: Diagnosis not present

## 2020-10-15 DIAGNOSIS — R490 Dysphonia: Secondary | ICD-10-CM

## 2020-10-15 DIAGNOSIS — R059 Cough, unspecified: Secondary | ICD-10-CM

## 2020-10-15 MED ORDER — BENZONATATE 100 MG PO CAPS: 200.0000 mg | ORAL_CAPSULE | Freq: Two times a day (BID) | ORAL | 0 refills | Status: AC | PRN

## 2020-10-15 NOTE — Progress Notes (Signed)
Virtual Visit via Video Note I connected with [name] on 10/15/20 by a video enabled telemedicine application and verified that I am speaking with the correct person using two identifiers.  Location patient: home Location provider:work office Persons participating in the virtual visit: patient, provider  I discussed the limitations of evaluation and management by telemedicine and the availability of in person appointments. The patient expressed understanding and agreed to proceed. Chief Complaint  Patient presents with   Cough    Coughing up mucus, soreness of chest cavity and throat.    HPI: Paula Stein is a 75 year old female with history of CHF, DM 2, GERD, hypertension, and hyperlipidemia complaining of almost 2 weeks of respiratory symptoms as described above. Symptoms seem to be worse after having colonoscopy.  Before procedure she had a "light cold."  Nasal congestion, fatigue, rhinorrhea, sore throat, dysphonia, and productive cough. Productive cough with "little blood" when she first started with symptoms, then yellowish sputum, and now she is having clear sputum. Negative for fever, chills, anosmia, ageusia, dyspnea, wheezing, exertional CP, palpitation, abdominal pain, nausea, vomiting, urinary symptoms, changes in bowel habits,body aches, or skin rash. She has not noted nose/gum bleed, melena, blood in the stool, or gross hematuria.  She is not taking OTC medications. + Smoker, she has not smoked since colonoscopy.  COVID-19 test on 10/02/2020, negative. She has COVID-19 vaccination + boosters x2. No sick contacts or recent travel. No hx of allergies.  ROS: See pertinent positives and negatives per HPI.  Past Medical History:  Diagnosis Date   AICD (automatic cardioverter/defibrillator) present 10/08/2014   SUBQ    /    DR Paula Stein   Cataract    CHF (congestive heart failure) (HCC)    Diabetes mellitus without complication ([place])    GERD (gastroesophageal reflux disease)     History of cardiac cath 05/05/2007   normal-with patent coronaries   History of colonoscopy    History of hiatal hernia    Hyperlipidemia    Hypertension    Iatrogenic thyroiditis    Non-ischemic cardiomyopathy (HCC)    EF 28%- reassessment of LV function 2011 with LVEf 45-50%   PAD (peripheral artery disease) (HCC)    lower extremities with ABIs of 0.5 bilaterally   Personal history of goiter    S/P radioactive iodine thyroid ablation    S/P thyroidectomy    Shortness of breath dyspnea    Sleep apnea    uses CPAP    Past Surgical History:  Procedure Laterality Date   BIOPSY  10/13/2020   Procedure: BIOPSY;  Surgeon: Paula Bullion, DO;  Location: [facility];  Service: Gastroenterology;;   CATARACT EXTRACTION Bilateral    COLONOSCOPY WITH PROPOFOL N/A 10/13/2020   Procedure: COLONOSCOPY WITH PROPOFOL;  Surgeon: Paula Bullion, DO;  Location: [facility];  Service: Gastroenterology;  Laterality: N/A;   EP IMPLANTABLE DEVICE N/A 10/08/2014   Procedure: SubQ ICD Implant;  Surgeon: Paula Sprang, MD;  Location: [facility];  Service: Cardiovascular;  Laterality: N/A;   POLYPECTOMY  10/13/2020   Procedure: POLYPECTOMY;  Surgeon: Paula Bullion, DO;  Location: [facility];  Service: Gastroenterology;;   Paula Stein ICD CHANGEOUT N/A 11/14/2019   Procedure: OZYY ICD CHANGEOUT;  Surgeon: Paula Sprang, MD;  Location: [facility];  Service: Cardiovascular;  Laterality: N/A;   THYROIDECTOMY     TONSILLECTOMY     TOTAL ABDOMINAL HYSTERECTOMY     Family History  Problem Relation Age of Onset  Diabetes type II Mother    Hypertension Mother    Diabetes Mother    Heart disease Mother    Other Father        deceased from accident age 67   Hyperlipidemia Father    Breast cancer Neg Hx    Stomach cancer Neg Hx    Colon cancer Neg Hx    Esophageal cancer Neg Hx    Pancreatic cancer Neg Hx    Social History   Socioeconomic History   Marital status: Divorced     Spouse name: Not on file   Number of children: 1   Years of education: Not on file   Highest education level: Not on file  Occupational History   Occupation: Retired    Fish farm manager: RETIRED    Comment: Education officer, museum  Tobacco Use   Smoking status: Light Smoker    Packs/day: 0.25    Years: 40.00    Pack years: 10.00    Types: Cigarettes   Smokeless tobacco: Never   Tobacco comments:    on and off,currently a pack / week ( 10/05/20)  Vaping Use   Vaping Use: Never used  Substance and Sexual Activity   Alcohol use: Yes    Alcohol/week: 0.0 standard drinks    Comment: rare glass of wine   Drug use: No   Sexual activity: Not Currently    Birth control/protection: None  Other Topics Concern   Not on file  Social History Narrative   Retired Education officer, museum   Divorced - one grown son   current smoker    Alcohol use-no      Drug use-no    Regular exercise- no     son - Paula Stein   Social Determinants of Health   Financial Resource Strain: Low Risk    Difficulty of Paying Living Expenses: Not hard at all  Food Insecurity: Not on file  Transportation Needs: No Transportation Needs   Lack of Transportation (Medical): No   Lack of Transportation (Non-Medical): No  Physical Activity: Inactive   Days of Exercise per Week: 0 days   Minutes of Exercise per Session: 0 min  Stress: No Stress Concern Present   Feeling of Stress : Not at all  Social Connections: Moderately Integrated   Frequency of Communication with Friends and Family: More than three times a week   Frequency of Social Gatherings with Friends and Family: More than three times a week   Attends Religious Services: More than 4 times per year   Active Member of [employer] or Organizations: Yes   Attends Archivist Meetings: 1 to 4 times per year   Marital Status: Divorced  Human resources officer Violence: Not At Risk   Fear of Current or Ex-Partner: No   Emotionally Abused: No   Physically Abused: No   Sexually  Abused: No   Current Outpatient Medications:    Accu-Chek Softclix Lancets lancets, Use to check blood sugar daily, Disp: 100 each, Rfl: 1   acetaminophen (TYLENOL) 500 MG tablet, Take 1 tablet (500 mg total) by mouth every 6 (six) hours as needed., Disp: 30 tablet, Rfl: 0   allopurinol (ZYLOPRIM) 100 MG tablet, Take 1 tablet (100 mg total) by mouth daily., Disp: 90 tablet, Rfl: 1   atorvastatin (LIPITOR) 40 MG tablet, Take 1 tablet (40 mg total) by mouth daily., Disp: 135 tablet, Rfl: 0   Blood Glucose Monitoring Suppl (ACCU-CHEK GUIDE) w/Device KIT, 1 each by Does not apply route daily. Use to  check blood sugar daily, Disp: 1 kit, Rfl: 0   carvedilol (COREG) 25 MG tablet, Take 1 tablet (25 mg total) by mouth 2 (two) times daily with a meal., Disp: 180 tablet, Rfl: 1   dicyclomine (BENTYL) 10 MG capsule, Take 1 capsule (10 mg total) by mouth every 6 (six) hours as needed for up to 14 days for spasms (abdominal pain)., Disp: 56 capsule, Rfl: 1   furosemide (LASIX) 40 MG tablet, Take 1 tablet (40 mg total) by mouth 2 (two) times daily., Disp: 180 tablet, Rfl: 0   glipiZIDE (GLUCOTROL XL) 2.5 MG 24 hr tablet, Take 1 tablet (2.5 mg total) by mouth daily with breakfast., Disp: 30 tablet, Rfl: 3   glucose blood (ACCU-CHEK GUIDE) test strip, Use to check blood sugar daily, Disp: 100 each, Rfl: 1   isosorbide mononitrate (IMDUR) 30 MG 24 hr tablet, TAKE 1 TABLET(30 MG) BY MOUTH DAILY, Disp: 90 tablet, Rfl: 2   levothyroxine (SYNTHROID) 125 MCG tablet, Take 1 tab by mouth every morning( on empty stomach) 30 minutes before taking other medications or before breakfast, Disp: 30 tablet, Rfl: 2   omeprazole (PRILOSEC) 40 MG capsule, Take 1 capsule (40 mg total) by mouth daily as needed (acid reflux)., Disp: , Rfl:    rivaroxaban (XARELTO) 20 MG TABS tablet, Take 1 tablet (20 mg total) by mouth daily with supper., Disp: 90 tablet, Rfl: 4   sacubitril-valsartan (ENTRESTO) 49-51 MG, Take 1 tablet by mouth 2 (two)  times daily., Disp: 180 tablet, Rfl: 1   spironolactone (ALDACTONE) 50 MG tablet, TAKE 1 TABLET(50 MG) BY MOUTH DAILY, Disp: 90 tablet, Rfl: 0  EXAM:  VITALS per patient if applicable:Ht 5' 10"  (1.778 m)   BMI 31.32 kg/m   GENERAL: alert, oriented, appears well and in no acute distress  HEENT: atraumatic, conjunctiva clear, no obvious abnormalities on inspection of external nose and ears Mild dysphonia.  NECK: normal movements of the head and neck  LUNGS: on inspection no signs of respiratory distress, breathing rate appears normal, no obvious gross SOB, gasping or wheezing Non productive cough.  CV: no obvious cyanosis  MS: moves all visible extremities without noticeable abnormality  PSYCH/NEURO: pleasant and cooperative, no obvious depression or anxiety, speech and thought processing grossly intact  ASSESSMENT AND PLAN:  Discussed the following assessment and plan: Orders Placed This Encounter  Procedures   DG Chest 2 View    Upper respiratory tract infection, unspecified type - Plan: DG Chest 2 View She may be having now residual symptoms. History and observation on video do not suggest a serious process. Symptomatic treatment recommended for now: Continue saline gargles.   Throat lozenges, adequate hydration,and Tylenol 500 mg 3-4 times per day ir needed. Repeat COVID-19 testing today and in 2 days. Instructed to monitor for fever and/or worsening symptoms. I do not think antibiotic treatment is needed at this time. F/U in 1-2 weeks if symptoms are not any better, before if needed.  Dysphonia We discussed possible etiologies. Allergic vs viral. Voice rest recommended. Instructed about warning signs.  Cough - Plan: benzonatate (TESSALON) 100 MG capsule, DG Chest 2 View Explained that cough and congestion can last a few days and even weeks after acute URI symptoms have resolved. Symptomatic treatment with benzonatate recommended. Plain Mucinex may  help. Adequate hydration. Encouraged smoking cessation. Clearly instructed about warning signs. CXR order placed.  We discussed possible serious and likely etiologies, options for evaluation and workup, limitations of telemedicine visit vs in person visit,  treatment, treatment risks and precautions.  I discussed the assessment and treatment plan with the patient. Ms. Bisaillon was provided an opportunity to ask questions and all were answered. She agreed with the plan and demonstrated an understanding of the instructions.  Return if symptoms worsen or fail to improve.  Alexzandria Massman [place], MD

## 2020-10-18 ENCOUNTER — Ambulatory Visit (INDEPENDENT_AMBULATORY_CARE_PROVIDER_SITE_OTHER)
Admission: RE | Admit: 2020-10-18 | Discharge: 2020-10-18 | Disposition: A | Discharge status: Home / Self Care | Payer: Medicare PPO | Source: Ambulatory Visit | Attending: Family Medicine | Admitting: Family Medicine

## 2020-10-18 ENCOUNTER — Ambulatory Visit
Admission: RE | Admit: 2020-10-18 | Discharge: 2020-10-18 | Disposition: A | Discharge status: Home / Self Care | Payer: Medicare PPO | Source: Ambulatory Visit | Attending: Family Medicine | Admitting: Family Medicine

## 2020-10-18 ENCOUNTER — Other Ambulatory Visit: Payer: Self-pay

## 2020-10-18 ENCOUNTER — Other Ambulatory Visit: Payer: Self-pay | Admitting: Family Medicine

## 2020-10-18 DIAGNOSIS — R079 Chest pain, unspecified: Secondary | ICD-10-CM | POA: Diagnosis not present

## 2020-10-18 DIAGNOSIS — J069 Acute upper respiratory infection, unspecified: Secondary | ICD-10-CM

## 2020-10-18 DIAGNOSIS — R059 Cough, unspecified: Secondary | ICD-10-CM | POA: Diagnosis not present

## 2020-10-18 DIAGNOSIS — J9811 Atelectasis: Secondary | ICD-10-CM | POA: Diagnosis not present

## 2020-10-19 ENCOUNTER — Encounter: Payer: Self-pay | Admitting: Gastroenterology

## 2020-10-20 ENCOUNTER — Ambulatory Visit (INDEPENDENT_AMBULATORY_CARE_PROVIDER_SITE_OTHER): Payer: Medicare PPO

## 2020-10-20 ENCOUNTER — Telehealth: Payer: Self-pay | Admitting: General Surgery

## 2020-10-20 DIAGNOSIS — Z0000 Encounter for general adult medical examination without abnormal findings: Secondary | ICD-10-CM | POA: Diagnosis not present

## 2020-10-20 NOTE — Progress Notes (Signed)
Subjective:   Paula Stein is a 75 y.o. female who presents for Medicare Annual (Subsequent) preventive examination.  Virtual Visit via Video Note  I connected with Paula Stein by a video enabled telemedicine application and verified that I am speaking with the correct person using two identifiers.  Location: Patient: Home Provider: Office Persons participating in the virtual visit: patient, provider   I discussed the limitations of evaluation and management by telemedicine and the availability of in person appointments. The patient expressed understanding and agreed to proceed.     Mickel Baas Amando Chaput,LPN   Review of Systems    N/a       Objective:    There were no vitals filed for this visit. There is no height or weight on file to calculate BMI.  Advanced Directives 10/13/2020 03/14/2020 03/14/2020 03/13/2020 11/14/2019 10/21/2019 03/13/2018  Does Patient Have a Medical Advance Directive? Yes No No Yes No Yes Yes  Type of Advance Directive Living will - - Living will;Healthcare Power of [place];Living will [place]  Does patient want to make changes to medical advance directive? - - - - - No - Patient declined -  Copy of [place] in Chart? - - - - - No - copy requested -  Would patient like information on creating a medical advance directive? - No - Patient declined - - Yes (MAU/Ambulatory/Procedural Areas - Information given) - -    Current Medications (verified) Outpatient Encounter Medications as of 10/20/2020  Medication Sig   Accu-Chek Softclix Lancets lancets Use to check blood sugar daily   acetaminophen (TYLENOL) 500 MG tablet Take 1 tablet (500 mg total) by mouth every 6 (six) hours as needed.   allopurinol (ZYLOPRIM) 100 MG tablet Take 1 tablet (100 mg total) by mouth daily.   atorvastatin (LIPITOR) 40 MG tablet Take 1 tablet (40 mg total) by mouth daily.   benzonatate (TESSALON) 100 MG capsule  Take 2 capsules (200 mg total) by mouth 2 (two) times daily as needed for up to 10 days.   Blood Glucose Monitoring Suppl (ACCU-CHEK GUIDE) w/Device KIT 1 each by Does not apply route daily. Use to check blood sugar daily   carvedilol (COREG) 25 MG tablet Take 1 tablet (25 mg total) by mouth 2 (two) times daily with a meal.   dicyclomine (BENTYL) 10 MG capsule Take 1 capsule (10 mg total) by mouth every 6 (six) hours as needed for up to 14 days for spasms (abdominal pain).   furosemide (LASIX) 40 MG tablet Take 1 tablet (40 mg total) by mouth 2 (two) times daily.   glipiZIDE (GLUCOTROL XL) 2.5 MG 24 hr tablet Take 1 tablet (2.5 mg total) by mouth daily with breakfast.   glucose blood (ACCU-CHEK GUIDE) test strip Use to check blood sugar daily   isosorbide mononitrate (IMDUR) 30 MG 24 hr tablet TAKE 1 TABLET(30 MG) BY MOUTH DAILY   levothyroxine (SYNTHROID) 125 MCG tablet Take 1 tab by mouth every morning( on empty stomach) 30 minutes before taking other medications or before breakfast   omeprazole (PRILOSEC) 40 MG capsule Take 1 capsule (40 mg total) by mouth daily as needed (acid reflux).   rivaroxaban (XARELTO) 20 MG TABS tablet Take 1 tablet (20 mg total) by mouth daily with supper.   sacubitril-valsartan (ENTRESTO) 49-51 MG Take 1 tablet by mouth 2 (two) times daily.   spironolactone (ALDACTONE) 50 MG tablet TAKE 1 TABLET(50 MG) BY MOUTH DAILY  No facility-administered encounter medications on file as of 10/20/2020.    Allergies (verified) Ibuprofen   History: Past Medical History:  Diagnosis Date   AICD (automatic cardioverter/defibrillator) present 10/08/2014   SUBQ    /    DR [name]   Cataract    CHF (congestive heart failure) (HCC)    Diabetes mellitus without complication (HCC)    GERD (gastroesophageal reflux disease)    History of cardiac cath 05/05/2007   normal-with patent coronaries   History of colonoscopy    History of hiatal hernia    Hyperlipidemia    Hypertension     Iatrogenic thyroiditis    Non-ischemic cardiomyopathy (HCC)    EF 28%- reassessment of LV function 2011 with LVEf 45-50%   PAD (peripheral artery disease) (HCC)    lower extremities with ABIs of 0.5 bilaterally   Personal history of goiter    S/P radioactive iodine thyroid ablation    S/P thyroidectomy    Shortness of breath dyspnea    Sleep apnea    uses CPAP   Past Surgical History:  Procedure Laterality Date   BIOPSY  10/13/2020   Procedure: BIOPSY;  Surgeon: [name], DO;  Location: [facility];  Service: Gastroenterology;;   CATARACT EXTRACTION Bilateral    COLONOSCOPY WITH PROPOFOL N/A 10/13/2020   Procedure: COLONOSCOPY WITH PROPOFOL;  Surgeon: [name], DO;  Location: [facility];  Service: Gastroenterology;  Laterality: N/A;   EP IMPLANTABLE DEVICE N/A 10/08/2014   Procedure: SubQ ICD Implant;  Surgeon: [name], MD;  Location: [facility];  Service: Cardiovascular;  Laterality: N/A;   POLYPECTOMY  10/13/2020   Procedure: POLYPECTOMY;  Surgeon: [name], DO;  Location: [facility];  Service: Gastroenterology;;   [name] ICD CHANGEOUT N/A 11/14/2019   Procedure: ST[facility] ICD CHANGEOUT;  Surgeon: [name], MD;  Location: [facility];  Service: Cardiovascular;  Laterality: N/A;   THYROIDECTOMY     TONSILLECTOMY     TOTAL ABDOMINAL HYSTERECTOMY     Family History  Problem Relation Age of Onset   Diabetes type II Mother    Hypertension Mother    Diabetes Mother    Heart disease Mother    Other Father        deceased from accident age 50   Hyperlipidemia Father    Breast cancer Neg Hx    Stomach cancer Neg Hx    Colon cancer Neg Hx    Esophageal cancer Neg Hx    Pancreatic cancer Neg Hx    Social History   Socioeconomic History   Marital status: Divorced    Spouse name: Not on file   Number of children: 1   Years of education: Not on file   Highest education level: Not on file  Occupational History    Occupation: Retired    Fish farm manager: RETIRED    Comment: Education officer, museum  Tobacco Use   Smoking status: Light Smoker    Packs/day: 0.25    Years: 40.00    Pack years: 10.00    Types: Cigarettes   Smokeless tobacco: Never   Tobacco comments:    on and off,currently a pack / week ( 10/05/20)  Vaping Use   Vaping Use: Never used  Substance and Sexual Activity   Alcohol use: Yes    Alcohol/week: 0.0 standard drinks    Comment: rare glass of wine   Drug use: No   Sexual activity: Not Currently    Birth control/protection: None  Other Topics Concern   Not on file  Social History Narrative   Retired Education officer, museum   Divorced - one grown son   current smoker    Alcohol use-no      Drug use-no    Regular exercise- no     son - Khalidah Herbold   Social Determinants of Health   Financial Resource Strain: Low Risk    Difficulty of Paying Living Expenses: Not hard at all  Food Insecurity: Not on file  Transportation Needs: No Transportation Needs   Lack of Transportation (Medical): No   Lack of Transportation (Non-Medical): No  Physical Activity: Inactive   Days of Exercise per Week: 0 days   Minutes of Exercise per Session: 0 min  Stress: No Stress Concern Present   Feeling of Stress : Not at all  Social Connections: Moderately Integrated   Frequency of Communication with Friends and Family: More than three times a week   Frequency of Social Gatherings with Friends and Family: More than three times a week   Attends Religious Services: More than 4 times per year   Active Member of [employer] or Organizations: Yes   Attends Archivist Meetings: 1 to 4 times per year   Marital Status: Divorced    Tobacco Counseling Ready to quit: Not Answered Counseling given: Not Answered Tobacco comments: on and off,currently a pack / week ( 10/05/20)   Clinical Intake:                 Diabetic?yes Nutrition Risk Assessment:  Has the patient had any N/V/D within the last 2  months?  No  Does the patient have any non-healing wounds?  No  Has the patient had any unintentional weight loss or weight gain?  No   Diabetes:  Is the patient diabetic?  Yes  If diabetic, was a CBG obtained today?  No  Did the patient bring in their glucometer from home?  No  How often do you monitor your CBG's? Daily .   Financial Strains and Diabetes Management:  Are you having any financial strains with the device, your supplies or your medication? No .  Does the patient want to be seen by Chronic Care Management for management of their diabetes?  No  Would the patient like to be referred to a Nutritionist or for Diabetic Management?  No   Diabetic Exams:  Diabetic Eye Exam: Completed 04/2020 Diabetic Foot Exam: Overdue, Pt has been advised about the importance in completing this exam. Pt is scheduled for diabetic foot exam on next office visit .          Activities of Daily Living In your present state of health, do you have any difficulty performing the following activities: 03/14/2020 10/21/2019  Hearing? N Y  Comment - Wears hearing aids  Vision? N N  Difficulty concentrating or making decisions? N N  Walking or climbing stairs? N N  Dressing or bathing? N N  Doing errands, shopping? N N  Preparing Food and eating ? - N  Using the Toilet? - N  In the past six months, have you accidently leaked urine? - N  Do you have problems with loss of bowel control? - N  Managing your Medications? - N  Managing your Finances? - N  Housekeeping or managing your Housekeeping? - N  Some recent data might be hidden    Patient Care Team: [name], MD as PCP - General (Family Medicine) [name], MD as  PCP - Cardiology (Cardiology) [name], MD as PCP - Electrophysiology (Cardiology)  Indicate any recent Medical Services you may have received from other than [facility] providers in the past year (date may be approximate).     Assessment:   This is a routine  wellness examination for [place].  Hearing/Vision screen No results found.  Dietary issues and exercise activities discussed:     Goals Addressed   None    Depression Screen PHQ 2/9 Scores 10/21/2019 09/04/2017 08/21/2016 06/12/2016 09/09/2015 06/02/2015 07/13/2014  PHQ - 2 Score 0 0 0 0 0 0 0  PHQ- 9 Score 0 - - - - - -    Fall Risk Fall Risk  10/21/2019 09/04/2017 06/12/2016 09/09/2015 06/02/2015  Falls in the past year? 0 No No No No  Number falls in past yr: 0 - - - -  Injury with Fall? 0 - - - -  Risk for fall due to : Medication side effect - - - -  Follow up Falls evaluation completed;Falls prevention discussed - - - -    FALL RISK PREVENTION PERTAINING TO THE HOME:  Any stairs in or around the home? Yes  If so, are there any without handrails? No  Home free of loose throw rugs in walkways, pet beds, electrical cords, etc? Yes  Adequate lighting in your home to reduce risk of falls? Yes   ASSISTIVE DEVICES UTILIZED TO PREVENT FALLS:  Life alert? No  Use of a cane, walker or w/c? No  Grab bars in the bathroom? No  Shower chair or bench in shower? No  Elevated toilet seat or a handicapped toilet? No    Cognitive Function: Normal cognitive status assessed by direct observation by this Nurse Health Advisor. No abnormalities found.   MMSE - Mini Mental State Exam 09/04/2017 08/31/2016  Not completed: (No Data) (No Data)     6CIT Screen 10/21/2019  What Year? 0 points  What month? 0 points  What time? 0 points  Count back from 20 0 points  Months in reverse 0 points  Repeat phrase 0 points  Total Score 0    Immunizations Immunization History  Administered Date(s) Administered   Fluad Quad(high Dose 65+) 12/24/2018, 12/15/2019   Influenza Split 02/15/2011   Influenza Whole 08/22/2004, 02/18/2007, 12/31/2008, 02/17/2010, 01/02/2012   Influenza, High Dose Seasonal PF 01/04/2015, 12/11/2016, 01/03/2018   Influenza,inj,Quad PF,6+ Mos 06/26/2013, 02/03/2014    Influenza-Unspecified 02/02/2016   Moderna Sars-Covid-2 Vaccination 05/05/2019, 06/02/2019, 02/03/2020   Pneumococcal Conjugate-13 06/26/2013   Pneumococcal Polysaccharide-23 08/22/2004, 02/03/2014   Td 12/22/2002   Tdap 06/26/2013   Zoster, Live 07/24/2014    TDAP status: Up to date  Flu Vaccine status: Up to date  Pneumococcal vaccine status: Up to date  Covid-19 vaccine status: Completed vaccines  Qualifies for Shingles Vaccine? Yes   Zostavax completed No   Shingrix Completed?: No.    Education has been provided regarding the importance of this vaccine. Patient has been advised to call insurance company to determine out of pocket expense if they have not yet received this vaccine. Advised may also receive vaccine at local pharmacy or Health Dept. Verbalized acceptance and understanding.  Screening Tests Health Maintenance  Topic Date Due   Zoster Vaccines- Shingrix (1 of 2) Never done   COVID-19 Vaccine (4 - Booster for Moderna series) 05/05/2020   HEMOGLOBIN A1C  09/12/2020   INFLUENZA VACCINE  11/01/2020   FOOT EXAM  05/31/2021   OPHTHALMOLOGY EXAM  06/12/2021   TETANUS/TDAP  06/27/2023   DEXA SCAN  Completed   Hepatitis C Screening  Completed   PNA vac Low Risk Adult  Completed   HPV VACCINES  Aged Out    Health Maintenance  Health Maintenance Due  Topic Date Due   Zoster Vaccines- Shingrix (1 of 2) Never done   COVID-19 Vaccine (4 - Booster for Moderna series) 05/05/2020   HEMOGLOBIN A1C  09/12/2020    Colorectal cancer screening: Type of screening: Colonoscopy. Completed 10/13/2020. Repeat every 10 years  Mammogram status: Completed 01/27/2020. Repeat every year  Bone Density status: Completed 01/01/2020. Results reflect: Bone density results: OSTEOPENIA. Repeat every 5 years.  Lung Cancer Screening: (Low Dose CT Chest recommended if Age 99-80 years, 30 pack-year currently smoking OR have quit w/in 15years.) does not qualify.   Lung Cancer Screening  Referral: n/a  Additional Screening:  Hepatitis C Screening: does not qualify; Completed 06/12/2015  Vision Screening: Recommended annual ophthalmology exams for early detection of glaucoma and other disorders of the eye. Is the patient up to date with their annual eye exam?  Yes  Who is the provider or what is the name of the office in which the patient attends annual eye exams? Dr.Noble  If pt is not established with a provider, would they like to be referred to a provider to establish care? No .   Dental Screening: Recommended annual dental exams for proper oral hygiene  Community Resource Referral / Chronic Care Management: CRR required this visit?  No   CCM required this visit?  No      Plan:     I have personally reviewed and noted the following in the patient's chart:   Medical and social history Use of alcohol, tobacco or illicit drugs  Current medications and supplements including opioid prescriptions.  Functional ability and status Nutritional status Physical activity Advanced directives List of other physicians Hospitalizations, surgeries, and ER visits in previous 12 months Vitals Screenings to include cognitive, depression, and falls Referrals and appointments  In addition, I have reviewed and discussed with patient certain preventive protocols, quality metrics, and best practice recommendations. A written personalized care plan for preventive services as well as general preventive health recommendations were provided to patient.     Randel Pigg, LPN   [id number]/01/[id number]   Nurse Notes: none

## 2020-10-20 NOTE — Telephone Encounter (Signed)
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  The larger rectal polyp was also a tubular adenoma.  The remainder of the polyps removed from the sigmoid colon and rectum were benign hyperplastic polyps.  The biopsies taken from the sigmoid colon in the area of diverticulosis were benign, and without chronic or active inflammatory changes.  Based on the benign hyperplastic polyps from the distal colon, I think it is reasonable to repeat colonoscopy in 1 year per current guidelines.  This should again be scheduled at [facility].  If patient continues to have lower abdominal pain despite the colonoscopy results and Bentyl prescription, we could try short course of 5-ASA therapy for possible SCAD.  Can otherwise follow-up in the GI clinic as needed.

## 2020-10-20 NOTE — Patient Instructions (Signed)
[name] , Thank you for taking time to come for your Medicare Wellness Visit. I appreciate your ongoing commitment to your health goals. Please review the following plan we discussed and let me know if I can assist you in the future.   Screening recommendations/referrals: Colonoscopy: 10/13/2020 Mammogram: 01/27/2020 Bone Density: 01/01/2020 Recommended yearly ophthalmology/optometry visit for glaucoma screening and checkup Recommended yearly dental visit for hygiene and checkup  Vaccinations: Influenza vaccine: due in Fall 2022  Pneumococcal vaccine: completed series  Tdap vaccine: 06/26/2013 Shingles vaccine: will obtain local pharmacy     Advanced directives: will provide copies   Conditions/risks identified: none   Next appointment: 01/21/2021 @930   Dr.Banks    Preventive Care 77 Years and Older, Female Preventive care refers to lifestyle choices and visits with your health care provider that can promote health and wellness. What does preventive care include? A yearly physical exam. This is also called an annual well check. Dental exams once or twice a year. Routine eye exams. Ask your health care provider how often you should have your eyes checked. Personal lifestyle choices, including: Daily care of your teeth and gums. Regular physical activity. Eating a healthy diet. Avoiding tobacco and drug use. Limiting alcohol use. Practicing safe sex. Taking low-dose aspirin every day. Taking vitamin and mineral supplements as recommended by your health care provider. What happens during an annual well check? The services and screenings done by your health care provider during your annual well check will depend on your age, overall health, lifestyle risk factors, and family history of disease. Counseling  Your health care provider may ask you questions about your: Alcohol use. Tobacco use. Drug use. Emotional well-being. Home and relationship well-being. Sexual  activity. Eating habits. History of falls. Memory and ability to understand (cognition). Work and work Statistician. Reproductive health. Screening  You may have the following tests or measurements: Height, weight, and BMI. Blood pressure. Lipid and cholesterol levels. These may be checked every 5 years, or more frequently if you are over 50 years old. Skin check. Lung cancer screening. You may have this screening every year starting at age 77 if you have a 30-pack-year history of smoking and currently smoke or have quit within the past 15 years. Fecal occult blood test (FOBT) of the stool. You may have this test every year starting at age 77. Flexible sigmoidoscopy or colonoscopy. You may have a sigmoidoscopy every 5 years or a colonoscopy every 10 years starting at age 77. Hepatitis C blood test. Hepatitis B blood test. Sexually transmitted disease (STD) testing. Diabetes screening. This is done by checking your blood sugar (glucose) after you have not eaten for a while (fasting). You may have this done every 1-3 years. Bone density scan. This is done to screen for osteoporosis. You may have this done starting at age 77. Mammogram. This may be done every 1-2 years. Talk to your health care provider about how often you should have regular mammograms. Talk with your health care provider about your test results, treatment options, and if necessary, the need for more tests. Vaccines  Your health care provider may recommend certain vaccines, such as: Influenza vaccine. This is recommended every year. Tetanus, diphtheria, and acellular pertussis (Tdap, Td) vaccine. You may need a Td booster every 10 years. Zoster vaccine. You may need this after age 77. Pneumococcal 13-valent conjugate (PCV13) vaccine. One dose is recommended after age 77. Pneumococcal polysaccharide (PPSV23) vaccine. One dose is recommended after age 77. Talk to your health care  provider about which screenings and vaccines  you need and how often you need them. This information is not intended to replace advice given to you by your health care provider. Make sure you discuss any questions you have with your health care provider. Document Released: 04/16/2015 Document Revised: 12/08/2015 Document Reviewed: 01/19/2015 Elsevier Interactive Patient Education  2017 [employer] Prevention in the Home Falls can cause injuries. They can happen to people of all ages. There are many things you can do to make your home safe and to help prevent falls. What can I do on the outside of my home? Regularly fix the edges of walkways and driveways and fix any cracks. Remove anything that might make you trip as you walk through a door, such as a raised step or threshold. Trim any bushes or trees on the path to your home. Use bright outdoor lighting. Clear any walking paths of anything that might make someone trip, such as rocks or tools. Regularly check to see if handrails are loose or broken. Make sure that both sides of any steps have handrails. Any raised decks and porches should have guardrails on the edges. Have any leaves, snow, or ice cleared regularly. Use sand or salt on walking paths during winter. Clean up any spills in your garage right away. This includes oil or grease spills. What can I do in the bathroom? Use night lights. Install grab bars by the toilet and in the tub and shower. Do not use towel bars as grab bars. Use non-skid mats or decals in the tub or shower. If you need to sit down in the shower, use a plastic, non-slip stool. Keep the floor dry. Clean up any water that spills on the floor as soon as it happens. Remove soap buildup in the tub or shower regularly. Attach bath mats securely with double-sided non-slip rug tape. Do not have throw rugs and other things on the floor that can make you trip. What can I do in the bedroom? Use night lights. Make sure that you have a light by your bed that  is easy to reach. Do not use any sheets or blankets that are too big for your bed. They should not hang down onto the floor. Have a firm chair that has side arms. You can use this for support while you get dressed. Do not have throw rugs and other things on the floor that can make you trip. What can I do in the kitchen? Clean up any spills right away. Avoid walking on wet floors. Keep items that you use a lot in easy-to-reach places. If you need to reach something above you, use a strong step stool that has a grab bar. Keep electrical cords out of the way. Do not use floor polish or wax that makes floors slippery. If you must use wax, use non-skid floor wax. Do not have throw rugs and other things on the floor that can make you trip. What can I do with my stairs? Do not leave any items on the stairs. Make sure that there are handrails on both sides of the stairs and use them. Fix handrails that are broken or loose. Make sure that handrails are as long as the stairways. Check any carpeting to make sure that it is firmly attached to the stairs. Fix any carpet that is loose or worn. Avoid having throw rugs at the top or bottom of the stairs. If you do have throw rugs, attach them to the  floor with carpet tape. Make sure that you have a light switch at the top of the stairs and the bottom of the stairs. If you do not have them, ask someone to add them for you. What else can I do to help prevent falls? Wear shoes that: Do not have high heels. Have rubber bottoms. Are comfortable and fit you well. Are closed at the toe. Do not wear sandals. If you use a stepladder: Make sure that it is fully opened. Do not climb a closed stepladder. Make sure that both sides of the stepladder are locked into place. Ask someone to hold it for you, if possible. Clearly mark and make sure that you can see: Any grab bars or handrails. First and last steps. Where the edge of each step is. Use tools that help you  move around (mobility aids) if they are needed. These include: Canes. Walkers. Scooters. Crutches. Turn on the lights when you go into a dark area. Replace any light bulbs as soon as they burn out. Set up your furniture so you have a clear path. Avoid moving your furniture around. If any of your floors are uneven, fix them. If there are any pets around you, be aware of where they are. Review your medicines with your doctor. Some medicines can make you feel dizzy. This can increase your chance of falling. Ask your doctor what other things that you can do to help prevent falls. This information is not intended to replace advice given to you by your health care provider. Make sure you discuss any questions you have with your health care provider. Document Released: 01/14/2009 Document Revised: 08/26/2015 Document Reviewed: 04/24/2014 Elsevier Interactive Patient Education  2017 [employer].

## 2020-10-20 NOTE — Telephone Encounter (Signed)
Left a voicemail for the patient to contact the office. Placed a recall for a colonoscopy for 1 year. Need to also discuss with her how the rx for Bentyl is working.

## 2020-10-29 ENCOUNTER — Ambulatory Visit (INDEPENDENT_AMBULATORY_CARE_PROVIDER_SITE_OTHER): Payer: Medicare PPO

## 2020-10-29 DIAGNOSIS — I428 Other cardiomyopathies: Secondary | ICD-10-CM

## 2020-10-30 LAB — CUP PACEART REMOTE DEVICE CHECK
Battery Remaining Percentage: 91 %
Date Time Interrogation Session: 20220727095100
Implantable Lead Implant Date: 20160707
Implantable Lead Location: 753862
Implantable Lead Model: 3401
Implantable Pulse Generator Implant Date: 20210813
Pulse Gen Serial Number: 143555

## 2020-11-07 ENCOUNTER — Encounter: Payer: Self-pay | Admitting: Family Medicine

## 2020-11-12 ENCOUNTER — Encounter: Payer: Self-pay | Admitting: Family Medicine

## 2020-11-12 DIAGNOSIS — E039 Hypothyroidism, unspecified: Secondary | ICD-10-CM

## 2020-11-12 MED ORDER — LEVOTHYROXINE SODIUM 125 MCG PO TABS: ORAL_TABLET | ORAL | 2 refills | Status: DC

## 2020-11-17 ENCOUNTER — Encounter: Payer: Self-pay | Admitting: Oncology

## 2020-11-18 ENCOUNTER — Other Ambulatory Visit: Payer: Self-pay

## 2020-11-18 MED ORDER — RIVAROXABAN 20 MG PO TABS: 20.0000 mg | ORAL_TABLET | Freq: Every day | ORAL | 4 refills | Status: DC

## 2020-11-24 NOTE — Progress Notes (Signed)
Remote ICD transmission.   

## 2020-11-29 DIAGNOSIS — M79671 Pain in right foot: Secondary | ICD-10-CM | POA: Diagnosis not present

## 2020-11-29 DIAGNOSIS — M25571 Pain in right ankle and joints of right foot: Secondary | ICD-10-CM | POA: Diagnosis not present

## 2020-11-29 DIAGNOSIS — M064 Inflammatory polyarthropathy: Secondary | ICD-10-CM | POA: Diagnosis not present

## 2020-11-29 DIAGNOSIS — M79642 Pain in left hand: Secondary | ICD-10-CM | POA: Diagnosis not present

## 2020-11-29 DIAGNOSIS — M109 Gout, unspecified: Secondary | ICD-10-CM | POA: Diagnosis not present

## 2020-11-29 DIAGNOSIS — M79641 Pain in right hand: Secondary | ICD-10-CM | POA: Diagnosis not present

## 2020-11-29 DIAGNOSIS — M25572 Pain in left ankle and joints of left foot: Secondary | ICD-10-CM | POA: Diagnosis not present

## 2020-11-29 DIAGNOSIS — M79672 Pain in left foot: Secondary | ICD-10-CM | POA: Diagnosis not present

## 2020-11-29 DIAGNOSIS — R768 Other specified abnormal immunological findings in serum: Secondary | ICD-10-CM | POA: Diagnosis not present

## 2020-12-10 ENCOUNTER — Ambulatory Visit: Payer: Medicare PPO | Admitting: Family Medicine

## 2020-12-10 DIAGNOSIS — Z20822 Contact with and (suspected) exposure to covid-19: Secondary | ICD-10-CM | POA: Diagnosis not present

## 2020-12-10 DIAGNOSIS — E119 Type 2 diabetes mellitus without complications: Secondary | ICD-10-CM | POA: Diagnosis not present

## 2020-12-10 DIAGNOSIS — J40 Bronchitis, not specified as acute or chronic: Secondary | ICD-10-CM | POA: Diagnosis not present

## 2020-12-13 ENCOUNTER — Telehealth: Payer: Medicare PPO | Admitting: Family Medicine

## 2020-12-16 ENCOUNTER — Other Ambulatory Visit: Payer: Self-pay | Admitting: Family Medicine

## 2020-12-25 IMAGING — MG DIGITAL SCREENING BILAT W/ TOMO W/ CAD
8 of 14 series · 8 of 40 positions shown · non-contrast
Comparison: Previous exam(s).

CLINICAL DATA: Screening.

EXAM:
DIGITAL SCREENING BILATERAL MAMMOGRAM WITH TOMO AND CAD

[L MLO synth-2D (1 of 3)]
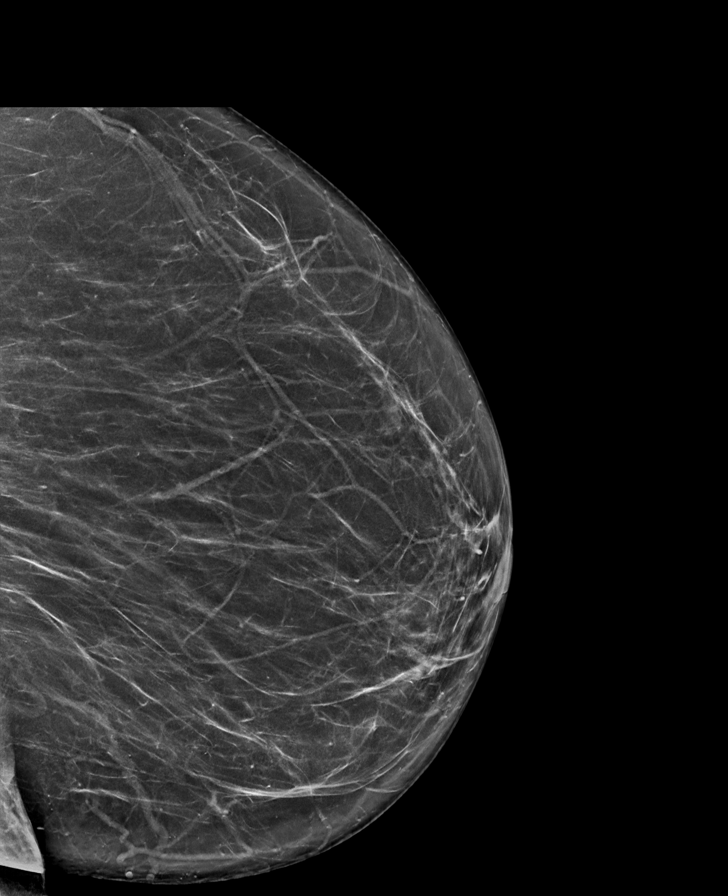

[R CC synth-2D]
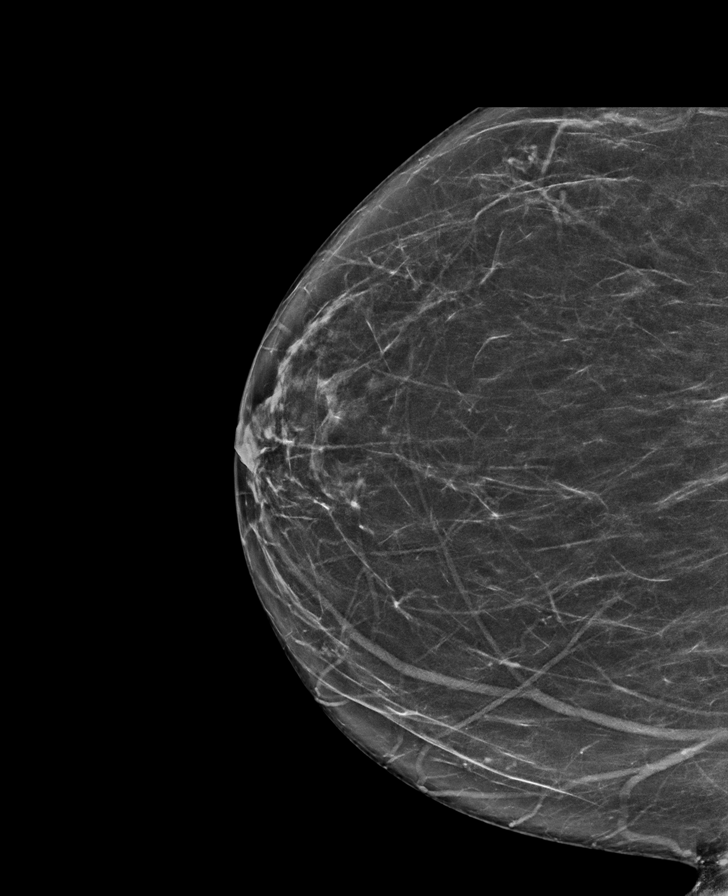

[R MLO synth-2D (1 of 2)]
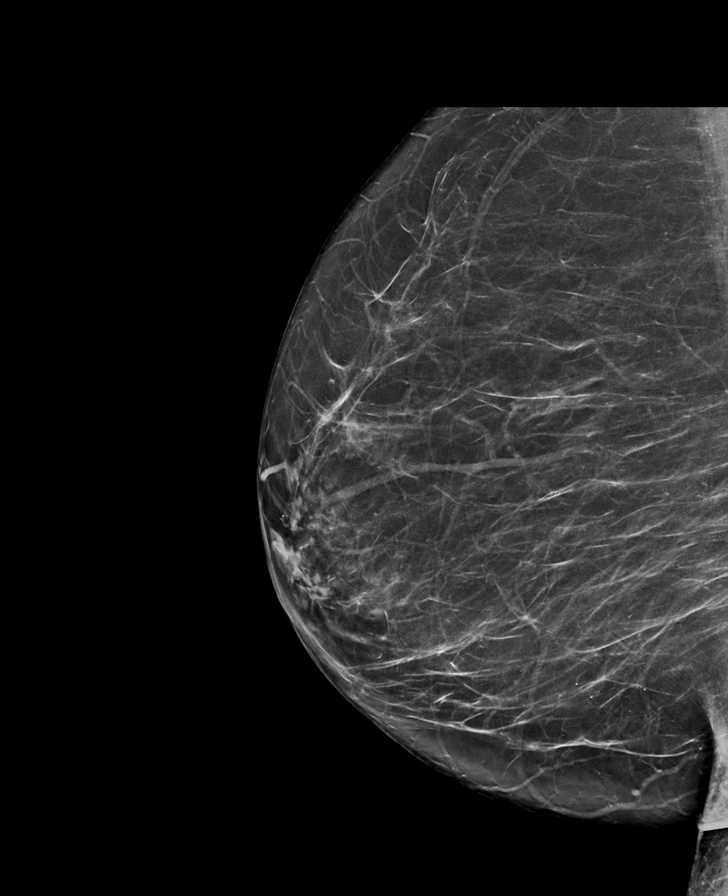

[L CC synth-2D]
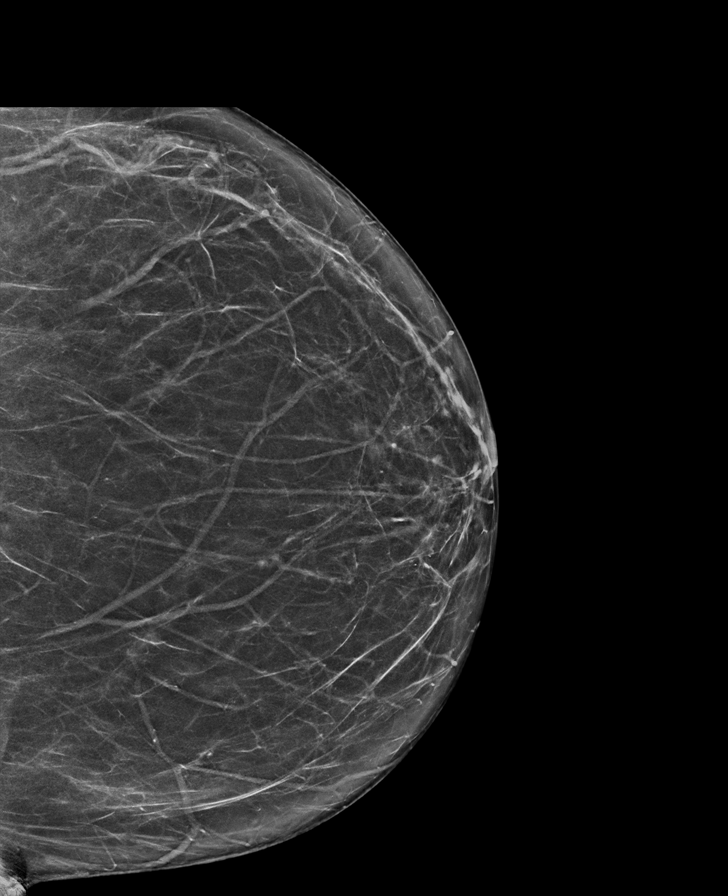

[R MLO synth-2D (2 of 2)]
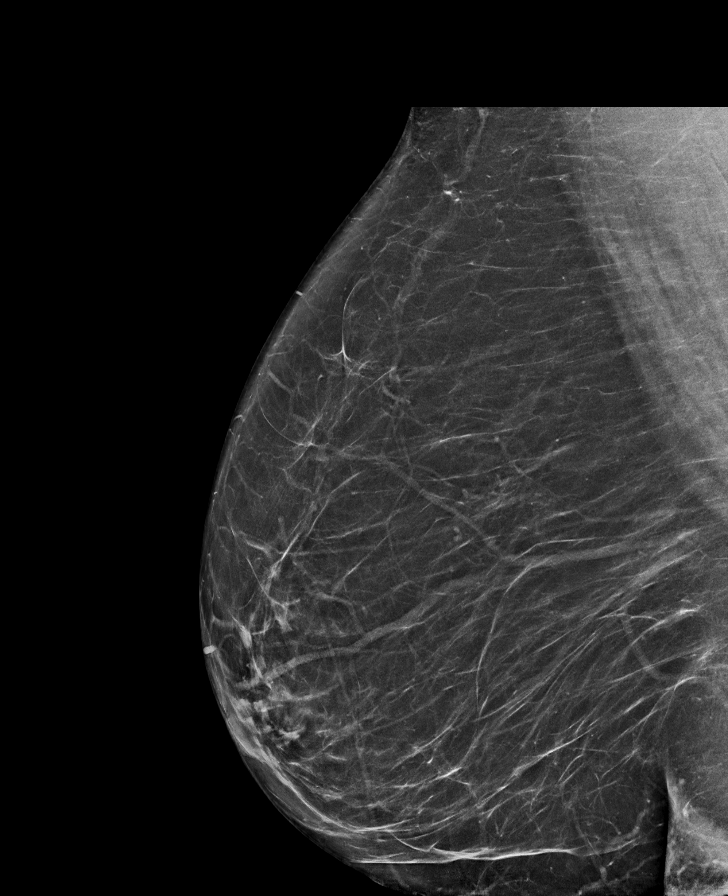

[L MLO synth-2D (2 of 3)]
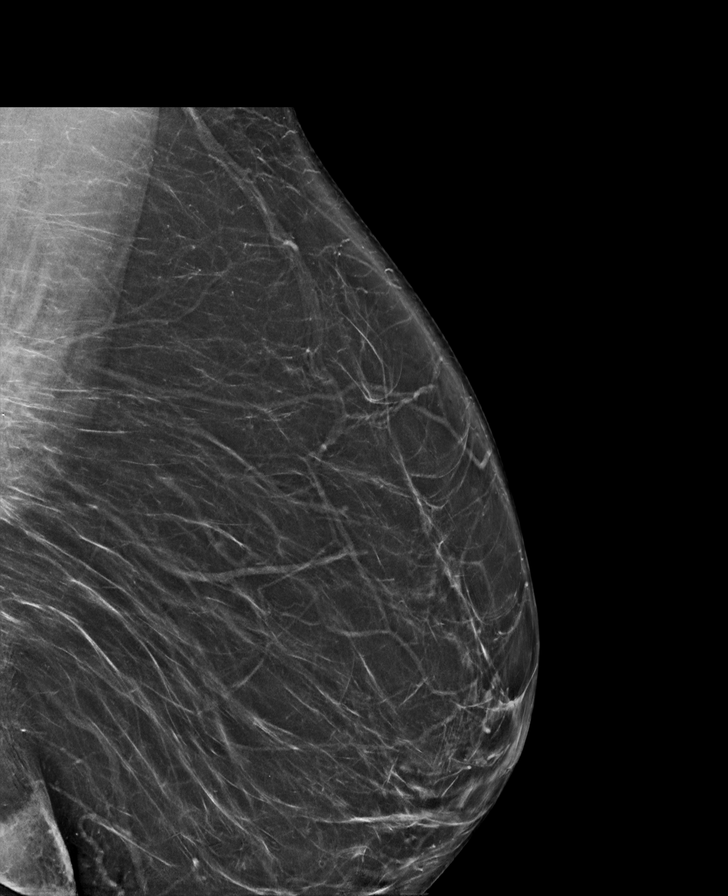

[L MLO synth-2D (3 of 3)]
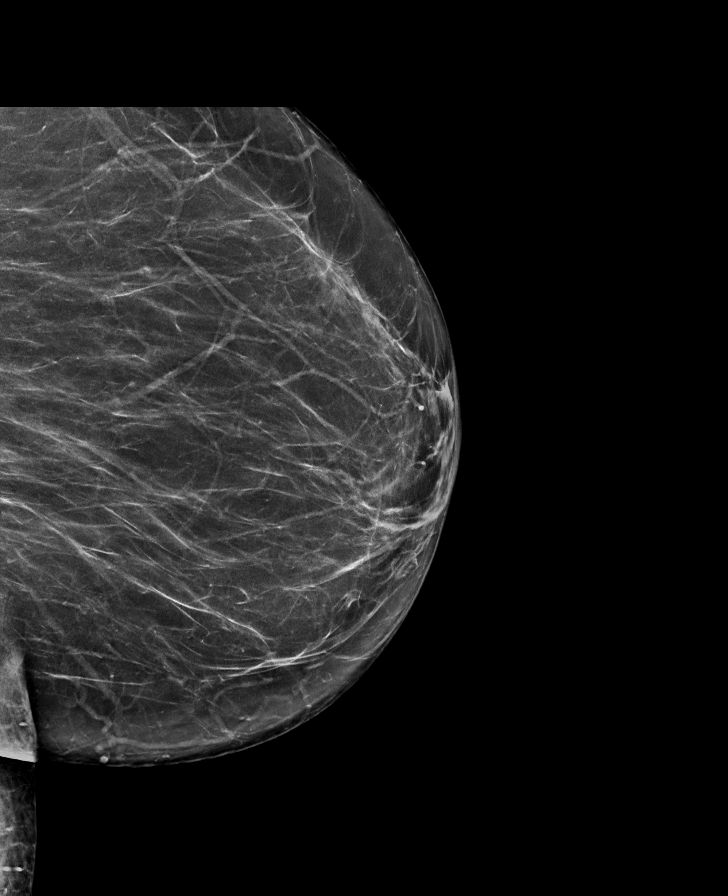

[L MLO tomo · tomo slice 39/78.0]
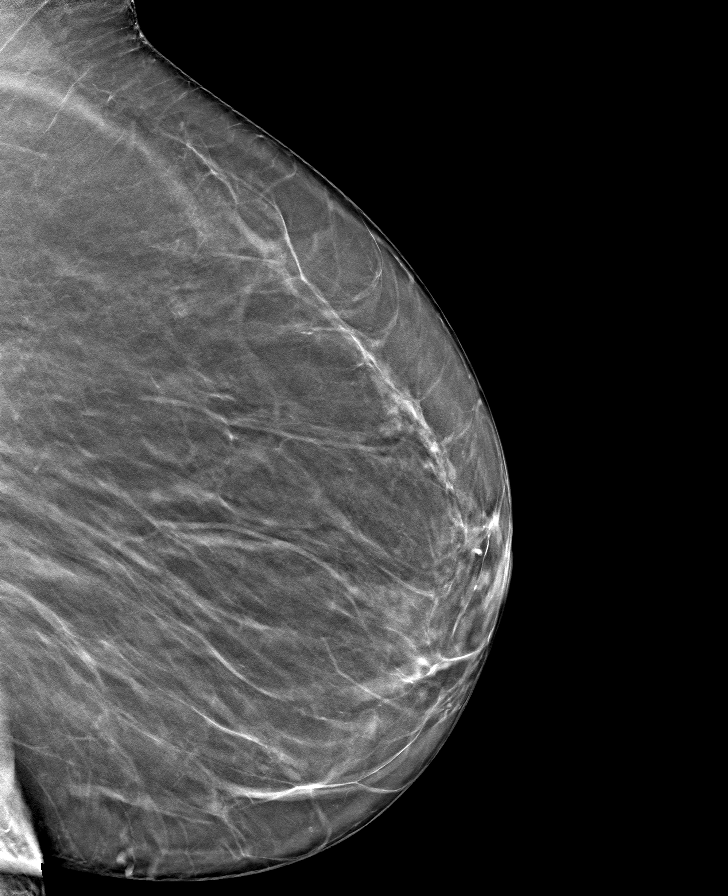

[8 of 40 positions shown; findings below may reference images not displayed]

ACR Breast Density Category b: There are scattered areas of
fibroglandular density.
FINDINGS: There are no findings suspicious for malignancy. Images were
processed with CAD.
IMPRESSION: No mammographic evidence of malignancy. A result letter of this
screening mammogram will be mailed directly to the patient.

RECOMMENDATION:
Screening mammogram in one year. (Code:[id number])

BI-RADS CATEGORY  1: Negative.

## 2021-01-10 ENCOUNTER — Other Ambulatory Visit: Payer: Self-pay | Admitting: Internal Medicine

## 2021-01-10 ENCOUNTER — Other Ambulatory Visit: Payer: Self-pay | Admitting: *Deleted

## 2021-01-10 MED ORDER — CARVEDILOL 25 MG PO TABS: 25.0000 mg | ORAL_TABLET | Freq: Two times a day (BID) | ORAL | 0 refills | Status: DC

## 2021-01-12 ENCOUNTER — Ambulatory Visit: Payer: Medicare PPO | Admitting: Family Medicine

## 2021-01-13 ENCOUNTER — Other Ambulatory Visit: Payer: Self-pay

## 2021-01-13 ENCOUNTER — Inpatient Hospital Stay: Payer: Medicare PPO | Attending: Oncology | Admitting: Oncology

## 2021-01-13 VITALS — BP 145/63 | HR 74 | Temp 97.6°F | Resp 18 | Ht 70.0 in | Wt 215.8 lb

## 2021-01-13 DIAGNOSIS — Z7901 Long term (current) use of anticoagulants: Secondary | ICD-10-CM | POA: Diagnosis not present

## 2021-01-13 DIAGNOSIS — I81 Portal vein thrombosis: Secondary | ICD-10-CM | POA: Insufficient documentation

## 2021-01-13 DIAGNOSIS — F17210 Nicotine dependence, cigarettes, uncomplicated: Secondary | ICD-10-CM | POA: Diagnosis not present

## 2021-01-13 DIAGNOSIS — D689 Coagulation defect, unspecified: Secondary | ICD-10-CM | POA: Diagnosis not present

## 2021-01-13 DIAGNOSIS — D6851 Activated protein C resistance: Secondary | ICD-10-CM | POA: Insufficient documentation

## 2021-01-13 DIAGNOSIS — Z79899 Other long term (current) drug therapy: Secondary | ICD-10-CM | POA: Diagnosis not present

## 2021-01-13 DIAGNOSIS — Z95810 Presence of automatic (implantable) cardiac defibrillator: Secondary | ICD-10-CM | POA: Diagnosis not present

## 2021-01-13 NOTE — Progress Notes (Signed)
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  As discussed at that time with the patient, that clot was not new, since there was evidence of cavernous transformation on the scans.  We discussed the possible causes of this and we specifically checked for JAK2, factor V Leiden and prothrombin gene mutations.  We did not send a probe for PNH in the absence of any evidence of hemolysis.   She then returned for follow-up and agreed she should continue anticoagulation lifelong.  Since last visit here she underwent colonoscopy under Dr. Bryan Lemma the pathology Dirk Dress [phone]) showed no malignancy and no high-grade dysplasia or carcinoma in the multiple polyps biopsied.  Repeat colonoscopy in 5 years as planned according to the patient  REVIEW OF SYSTEMS: [name] is tolerating the rivaroxaban without any complications she is aware of.  There have been no bleeding or bruising issues.  In fact she says when she checks her blood sugar the blood comes out more slowly than before.  She is currently obtaining the medication at $88 for 90tablets.  She usually takes it at bedtime.  A detailed review of systems today was otherwise stable   COVID 19 VACCINATION STATUS: Status post Moderna x4 as of October 2022  HISTORY OF CURRENT ILLNESS:  From the original consult note:  Paula Stein is a 75 year old female  from [place], [place]. She has a past medical history significant for diabetes mellitus type 2, hypertension, hypothyroidism, CKD, nonischemic cardiac myopathy s/p ICD implantation.  Patient presented to the hospital with a 4-day history of abdominal pain.  Pain was sharp and in her lower abdomen.  It caused her to lose control of her bowels initially and then was associated with nausea and vomiting.  CT abdomen/pelvis showed portal vein thrombosis and diverticulitis.  Additionally, UA was concerning for UTI.  She has been started on a heparin drip.   Hematology was asked to see the patient to make recommendations regarding her PVT--see the consult note from 03/15/2020.  Multiple labs were obtained at that time.   This included a JAK2 genotype, the prothrombin gene mutation, and the factor V Leiden mutation.  None of those mutations were present.  The patient's subsequent history is as detailed below.     PAST MEDICAL HISTORY: Past Medical History:  Diagnosis Date   AICD (automatic cardioverter/defibrillator) present 10/08/2014   SUBQ    /    DR [name]   Cataract    CHF (congestive heart failure) (HCC)    Diabetes mellitus without complication (HCC)    GERD (gastroesophageal reflux disease)    History of cardiac cath 05/05/2007   normal-with patent coronaries   History of colonoscopy    History of hiatal hernia    Hyperlipidemia    Hypertension    Iatrogenic thyroiditis    Non-ischemic cardiomyopathy ([place])    EF 28%- reassessment of LV function 2011 with LVEf 45-50%   PAD (peripheral artery disease) ([place])  lower extremities with ABIs of 0.5 bilaterally   Personal history of goiter    S/P radioactive iodine thyroid ablation    S/P thyroidectomy    Shortness of breath dyspnea    Sleep apnea    uses CPAP    PAST SURGICAL HISTORY: Past Surgical History:  Procedure Laterality Date   BIOPSY  10/13/2020   Procedure: BIOPSY;  Surgeon: [name], DO;  Location: [facility];   Service: Gastroenterology;;   CATARACT EXTRACTION Bilateral    COLONOSCOPY WITH PROPOFOL N/A 10/13/2020   Procedure: COLONOSCOPY WITH PROPOFOL;  Surgeon: [name], DO;  Location: [facility];  Service: Gastroenterology;  Laterality: N/A;   EP IMPLANTABLE DEVICE N/A 10/08/2014   Procedure: SubQ ICD Implant;  Surgeon: [name], MD;  Location: [facility];  Service: Cardiovascular;  Laterality: N/A;   POLYPECTOMY  10/13/2020   Procedure: POLYPECTOMY;  Surgeon: [name], DO;  Location: [facility];  Service: Gastroenterology;;   [name] ICD CHANGEOUT N/A 11/14/2019   Procedure: GHWE ICD CHANGEOUT;  Surgeon: [name], MD;  Location: [facility];  Service: Cardiovascular;  Laterality: N/A;   THYROIDECTOMY     TONSILLECTOMY     TOTAL ABDOMINAL HYSTERECTOMY      FAMILY HISTORY: Family History  Problem Relation Age of Onset   Diabetes type II Mother    Hypertension Mother    Diabetes Mother    Heart disease Mother    Other Father        deceased from accident age 81   Hyperlipidemia Father    Breast cancer Neg Hx    Stomach cancer Neg Hx    Colon cancer Neg Hx    Esophageal cancer Neg Hx    Pancreatic cancer Neg Hx    The patient's father died at the age of 19 after hit-and-run accident.  The patient's mother died at the age of 76 from complications of diabetes.  The patient reports that she had a maternal aunt who was on Coumadin.  She thinks that she was on Coumadin for a blood clot.  Denies any other new or family history of blood clots.  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 She is divorced. [place] son [name], as well as his wife [name] and their 9 year old daughter.    ADVANCED DIRECTIVES: Has not completed a living will or healthcare power of attorney but reports that her son, [name], should make decisions for her if  she cannot.   HEALTH MAINTENANCE: Social History   Tobacco Use   Smoking status: Light Smoker    Packs/day: 0.25    Years: 40.00    Pack years: 10.00    Types: Cigarettes   Smokeless tobacco: Never   Tobacco comments:    on and off,currently a pack / week ( 10/05/20)  Vaping Use   Vaping Use: Never used  Substance Use Topics   Alcohol use: Yes    Alcohol/week: 0.0 standard drinks    Comment: rare glass of wine   Drug use: No     Colonoscopy: about 10 years ago  PAP:   Bone density: 12/2019, osteopenia    Allergies  Allergen Reactions   Ibuprofen Nausea Only and Other (See Comments)    Current Outpatient Medications  Medication Sig Dispense Refill   Accu-Chek Softclix Lancets lancets Use to  check blood sugar daily 100 each 1   acetaminophen (TYLENOL) 500 MG tablet Take 1 tablet (500 mg total) by mouth every 6 (six) hours as needed. 30 tablet 0   allopurinol (ZYLOPRIM) 100 MG tablet Take 1 tablet (100 mg total) by mouth daily. 90 tablet 1   atorvastatin (LIPITOR) 40 MG tablet Take 1 tablet (40 mg total) by mouth daily. 135 tablet 0   Blood Glucose Monitoring Suppl (ACCU-CHEK GUIDE) w/Device KIT 1 each by Does not apply route daily. Use to check blood sugar daily 1 kit 0   carvedilol (COREG) 25 MG tablet Take 1 tablet (25 mg total) by mouth 2 (two) times daily with a meal. 60 tablet 0   dicyclomine (BENTYL) 10 MG capsule Take 1 capsule (10 mg total) by mouth every 6 (six) hours as needed for up to 14 days for spasms (abdominal pain). 56 capsule 1   furosemide (LASIX) 40 MG tablet Take 1 tablet (40 mg total) by mouth 2 (two) times daily. 180 tablet 0   glipiZIDE (GLUCOTROL XL) 2.5 MG 24 hr tablet TAKE 1 TABLET(2.5 MG) BY MOUTH DAILY WITH BREAKFAST 30 tablet 1   glucose blood (ACCU-CHEK GUIDE) test strip Use to check blood sugar daily 100 each 1   isosorbide mononitrate (IMDUR) 30 MG 24 hr tablet TAKE 1 TABLET(30 MG) BY MOUTH DAILY 90 tablet 2   levothyroxine (SYNTHROID) 125 MCG  tablet Take 1 tab by mouth every morning( on empty stomach) 30 minutes before taking other medications or before breakfast 30 tablet 2   omeprazole (PRILOSEC) 40 MG capsule Take 1 capsule (40 mg total) by mouth daily as needed (acid reflux).     rivaroxaban (XARELTO) 20 MG TABS tablet Take 1 tablet (20 mg total) by mouth daily with supper. 90 tablet 4   sacubitril-valsartan (ENTRESTO) 49-51 MG Take 1 tablet by mouth 2 (two) times daily. 180 tablet 1   spironolactone (ALDACTONE) 50 MG tablet TAKE 1 TABLET(50 MG) BY MOUTH DAILY 90 tablet 0   No current facility-administered medications for this visit.    OBJECTIVE: African-American woman who appears stated age  43:   01/13/21 1522  BP: (!) 145/63  Pulse: 74  Resp: 18  Temp: 97.6 F (36.4 C)  SpO2: 96%     Body mass index is 30.96 kg/m.   Wt Readings from Last 3 Encounters:  01/13/21 215 lb 12.8 oz (97.9 kg)  10/13/20 218 lb 4.1 oz (99 kg)  10/05/20 219 lb (99.3 kg)      ECOG FS:1 - Symptomatic but completely ambulatory  Ocular: Sclerae unicteric, EOMs intact Ear-nose-throat: Wearing a mask Lymphatic: No cervical or supraclavicular adenopathy Lungs no rales or rhonchi Heart regular rate and rhythm Abd soft, obese, nontender  MSK no focal spinal tenderness, no joint edema Neuro: non-focal, well-oriented, appropriate affect Breasts: There are no palpable masses.  There is no skin or nipple change of concern.  Both axillae are benign.   LAB RESULTS:  CMP     Component Value Date/Time   NA 140 03/31/2020 0919   NA 143 06/13/2019 1428   K 4.9 03/31/2020 0919   CL 108 03/31/2020 0919   CO2 23 03/31/2020 0919   GLUCOSE 107 (H) 03/31/2020 0919   BUN 22 03/31/2020 0919   BUN 17 06/13/2019 1428   CREATININE 1.46 (H) 03/31/2020 0919   CALCIUM 9.7 03/31/2020 0919   PROT 6.4 (L) 03/15/2020 0058   ALBUMIN 2.8 (L) 03/15/2020 0058   AST 11 (L) 03/15/2020  0058   ALT 11 03/15/2020 0058   ALKPHOS 80 03/15/2020 0058   BILITOT  0.5 03/15/2020 0058   GFRNONAA 35 (L) 03/31/2020 0919   GFRAA 40 (L) 03/31/2020 0919    No results found for: TOTALPROTELP, ALBUMINELP, A1GS, A2GS, BETS, BETA2SER, GAMS, MSPIKE, SPEI  Lab Results  Component Value Date   WBC 5.3 03/31/2020   NEUTROABS 2.7 03/31/2020   HGB 13.4 03/31/2020   HCT 41.2 03/31/2020   MCV 90.9 03/31/2020   PLT 299.0 03/31/2020    No results found for: LABCA2  No components found for: [id number]  No results for input(s): INR in the last 168 hours.  No results found for: LABCA2  No results found for: QXI503  No results found for: UUE280  No results found for: KLK917  No results found for: CA2729  No components found for: HGQUANT  No results found for: CEA1 / No results found for: CEA1   No results found for: AFPTUMOR  No results found for: CHROMOGRNA  No results found for: KPAFRELGTCHN, LAMBDASER, KAPLAMBRATIO (kappa/lambda light chains)  No results found for: HGBA, HGBA2QUANT, HGBFQUANT, HGBSQUAN (Hemoglobinopathy evaluation)   No results found for: LDH  No results found for: IRON, TIBC, IRONPCTSAT (Iron and TIBC)  No results found for: FERRITIN  Urinalysis    Component Value Date/Time   COLORURINE AMBER (A) 03/13/2020 2212   APPEARANCEUR CLOUDY (A) 03/13/2020 2212   LABSPEC 1.024 03/13/2020 2212   PHURINE 5.0 03/13/2020 2212   GLUCOSEU NEGATIVE 03/13/2020 2212   HGBUR NEGATIVE 03/13/2020 Green 03/13/2020 2212   BILIRUBINUR neg 07/14/2019 0936   KETONESUR NEGATIVE 03/13/2020 2212   PROTEINUR >=300 (A) 03/13/2020 2212   UROBILINOGEN 0.2 07/14/2019 0936   UROBILINOGEN 0.2 01/28/2007 1647   NITRITE POSITIVE (A) 03/13/2020 2212   LEUKOCYTESUR TRACE (A) 03/13/2020 2212     STUDIES: No results found.   ELIGIBLE FOR AVAILABLE RESEARCH PROTOCOL: no  ASSESSMENT: 75 y.o. [place] woman with incidentally found portal vein thrombosis  December 2021 in course of work-up for diverticulosis.    (1)  Jak 2, prothrombin gene and factor V Leiden probes 03/15/2020 found no evidence of mutations.    (2) started on intravenous heparin at diagnosis, transitioned to rivaroxaban as of 03/17/2020, continues on rivaroxaban 20 mg daily for lifelong anticoagulation   PLAN: Loisann is doing very well on the rivaroxaban and the plan is to continue that indefinitely.  She is aware of the bleeding risks associated with this medication and is very careful regarding falls and avoiding possible trauma occasions.  I think it would be useful for her to see [place] on a long-term basis so that we can be available if she has any bleeding or clotting problems or needs to undergo other procedures.  For example we generally hold rivaroxaban only for 2 days and not 7 days as we normally do with warfarin.  I suggested she might want to get a bracelet stating that she is on a blood thinner in case she is in a car accident or similar in the future  She will see [place] again in August of next year and likely yearly thereafter.  She knows to call us for any other issue that may develop before then.  Total encounter time 20 minutes.Sarajane Jews C. Magrinat, MD 01/13/2021 3:36 PM Medical Oncology and Hematology [facility] [place], [place] [address] Tel. [phone]    Fax. [phone]   This document  serves as a record of services personally performed by Lurline Del, MD. It was created on his behalf by Wilburn Mylar, a trained medical scribe. The creation of this record is based on the scribe's personal observations and the provider's statements to them.   I, Lurline Del MD, have reviewed the above documentation for accuracy and completeness, and I agree with the above.    *Total Encounter Time as defined by the Centers for Medicare and Medicaid Services includes, in addition to the face-to-face time of a patient visit (documented in the note above) non-face-to-face time: obtaining and  reviewing outside history, ordering and reviewing medications, tests or procedures, care coordination (communications with other health care professionals or caregivers) and documentation in the medical record.

## 2021-01-14 ENCOUNTER — Ambulatory Visit: Payer: Medicare PPO | Admitting: Family Medicine

## 2021-01-14 ENCOUNTER — Encounter: Payer: Self-pay | Admitting: Family Medicine

## 2021-01-14 VITALS — BP 124/68 | HR 77 | Temp 98.2°F | Wt 216.8 lb

## 2021-01-14 DIAGNOSIS — E1140 Type 2 diabetes mellitus with diabetic neuropathy, unspecified: Secondary | ICD-10-CM | POA: Diagnosis not present

## 2021-01-14 DIAGNOSIS — Z23 Encounter for immunization: Secondary | ICD-10-CM

## 2021-01-14 DIAGNOSIS — I5042 Chronic combined systolic (congestive) and diastolic (congestive) heart failure: Secondary | ICD-10-CM | POA: Diagnosis not present

## 2021-01-14 DIAGNOSIS — I81 Portal vein thrombosis: Secondary | ICD-10-CM

## 2021-01-14 DIAGNOSIS — F17210 Nicotine dependence, cigarettes, uncomplicated: Secondary | ICD-10-CM | POA: Diagnosis not present

## 2021-01-14 DIAGNOSIS — E890 Postprocedural hypothyroidism: Secondary | ICD-10-CM | POA: Diagnosis not present

## 2021-01-14 LAB — POCT GLYCOSYLATED HEMOGLOBIN (HGB A1C): Hemoglobin A1C: 6 % — AB (ref 4.0–5.6)

## 2021-01-14 NOTE — Patient Instructions (Signed)
Your Hemoglobin A1C was 6.0% this visit.  Continue working to decrease the amount of cigarettes you are smoking each day.  We should check your cholesterol when you are fasting.

## 2021-01-14 NOTE — Progress Notes (Signed)
Subjective:    Patient ID: Paula Stein, female    DOB: 1945/09/08, 75 y.o.   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  Overall patient states she is doing well/feeling good.  Walking some for exercise.  Denies SOB, LE edema, fatigue, LE pain/neuropathy.  Thinks improvement might be associated with decreased smoking.  Now smoking less than 1 pack/week.  Plans to quit in December.  Pt recently seen by Cardiology. Pt's Cardiologist is retiring.  Advised to remain on blood thinner and additional x6 months.   Had colonoscopy in July, several polyps removed.  Repeat colonoscopy advised in 5 years.  Patient has mammogram scheduled in a few weeks.  Inquires about immunizations.  Planning to get a medical alert bracelet.  Patient's family is doing well.  She lives with her son, daughter-in-law, and her granddaughter.  Past Medical History:  Diagnosis Date   AICD (automatic cardioverter/defibrillator) present 10/08/2014   SUBQ    /    DR [name]   Cataract    CHF (congestive heart failure) (HCC)    Diabetes mellitus without complication ([place])    GERD (gastroesophageal reflux disease)    History of cardiac cath 05/05/2007   normal-with patent coronaries   History of colonoscopy    History of hiatal hernia    Hyperlipidemia    Hypertension    Iatrogenic thyroiditis    Non-ischemic cardiomyopathy ([place])    EF 28%- reassessment of LV function 2011 with LVEf 45-50%   PAD (peripheral artery disease) (HCC)    lower extremities with ABIs of 0.5 bilaterally   Personal history of goiter    S/P radioactive iodine thyroid ablation    S/P thyroidectomy    Shortness of breath dyspnea    Sleep apnea    uses CPAP    Allergies  Allergen  Reactions   Ibuprofen Nausea Only and Other (See Comments)    ROS General: Denies fever, chills, night sweats, changes in weight, changes in appetite HEENT: Denies headaches, ear pain, changes in vision, rhinorrhea, sore throat CV: Denies CP, palpitations, SOB, orthopnea Pulm: Denies SOB, cough, wheezing GI: Denies abdominal pain, nausea, vomiting, diarrhea, constipation GU: Denies dysuria, hematuria, frequency, vaginal discharge Msk: Denies muscle cramps, joint pains Neuro: Denies weakness, numbness, tingling Skin: Denies rashes, bruising Psych: Denies depression, anxiety, hallucinations     Objective:    Blood pressure 124/68, pulse 77, temperature 98.2 F (36.8 C), temperature source Oral, weight 216 lb 12.8 oz (98.3 kg), SpO2 98 %.  Gen. Pleasant, well-nourished, in no distress, normal affect   HEENT: [place]/AT, face symmetric, conjunctiva clear, no scleral icterus, PERRLA, EOMI, nares patent without drainage, pharynx without erythema or exudate. Lungs: no accessory muscle use, CTAB, no wheezes or rales Cardiovascular: RRR, no m/r/g, no peripheral edema Musculoskeletal: No deformities, no cyanosis or clubbing, normal tone Neuro:  A&Ox3, CN II-XII intact, normal gait Skin:  Warm, no lesions/ rash   Wt Readings from Last 3 Encounters:  01/14/21 216 lb 12.8 oz (98.3 kg)  01/13/21 215 lb 12.8 oz (97.9 kg)  10/13/20 218 lb 4.1 oz (99 kg)    Lab Results  Component Value Date   WBC 5.3 03/31/2020   HGB 13.4 03/31/2020  HCT 41.2 03/31/2020   PLT 299.0 03/31/2020   GLUCOSE 107 (H) 03/31/2020   CHOL 228 (H) 02/06/2019   TRIG 165 (H) 02/06/2019   HDL 91 02/06/2019   LDLDIRECT 195.2 02/15/2011   LDLCALC 109 (H) 02/06/2019   ALT 11 03/15/2020   AST 11 (L) 03/15/2020   NA 140 03/31/2020   K 4.9 03/31/2020   CL 108 03/31/2020   CREATININE 1.46 (H) 03/31/2020   BUN 22 03/31/2020   CO2 23 03/31/2020   TSH 5.23 (H) 03/31/2020   INR 0.9 10/01/2014   HGBA1C 6.0 (A) 01/14/2021    MICROALBUR 33.5 (H) 06/02/2015    Assessment/Plan:  Type 2 diabetes mellitus with diabetic neuropathy, without long-term current use of insulin (HCC)  -Continue glipizide 2.5 mg daily -Eye exam and foot exam up-to-date. - Plan: POC HgB A1c  Cigarette nicotine dependence without complication -Smoking cessation greater than 3 minutes, less than 10 minutes -Patient smoking less than 1 pack/week -Encouraged to continue decreasing cigarette use with plans to quit in December 2022. -Continue to monitor  Need for influenza vaccination  - Plan: Flu Vaccine QUAD High Dose(Fluad)  Chronic combined systolic and diastolic CHF (congestive heart failure) (HCC) -Euvolemic -Continue lifestyle modifications -Continue current medications  -Continue follow-up with cardiology  Postoperative hypothyroidism  -continue synthroid 125 mcg  - Plan: TSH  Portal vein thrombosis -stable -Initially noted 03/2020 -Jak 2, factor V Leiden and prothrombin gene mutations were negative -Continue Xarelto 2 mg indefinitely per recent oncology visit. -Continue follow-up with oncology  F/u in 1-2 months for CPE and labs.  Grier Mitts, MD

## 2021-01-17 ENCOUNTER — Emergency Department (HOSPITAL_COMMUNITY)
Admission: EM | Admit: 2021-01-17 | Discharge: 2021-01-18 | Disposition: A | Discharge status: Home / Self Care | Payer: Medicare PPO | Attending: Emergency Medicine | Admitting: Emergency Medicine

## 2021-01-17 ENCOUNTER — Other Ambulatory Visit: Payer: Self-pay

## 2021-01-17 ENCOUNTER — Encounter (HOSPITAL_COMMUNITY): Payer: Self-pay

## 2021-01-17 DIAGNOSIS — Z7984 Long term (current) use of oral hypoglycemic drugs: Secondary | ICD-10-CM | POA: Insufficient documentation

## 2021-01-17 DIAGNOSIS — Z79899 Other long term (current) drug therapy: Secondary | ICD-10-CM | POA: Diagnosis not present

## 2021-01-17 DIAGNOSIS — S61012A Laceration without foreign body of left thumb without damage to nail, initial encounter: Secondary | ICD-10-CM | POA: Diagnosis not present

## 2021-01-17 DIAGNOSIS — F17210 Nicotine dependence, cigarettes, uncomplicated: Secondary | ICD-10-CM | POA: Diagnosis not present

## 2021-01-17 DIAGNOSIS — Z23 Encounter for immunization: Secondary | ICD-10-CM | POA: Diagnosis not present

## 2021-01-17 DIAGNOSIS — I2510 Atherosclerotic heart disease of native coronary artery without angina pectoris: Secondary | ICD-10-CM | POA: Diagnosis not present

## 2021-01-17 DIAGNOSIS — S6992XA Unspecified injury of left wrist, hand and finger(s), initial encounter: Secondary | ICD-10-CM | POA: Diagnosis present

## 2021-01-17 DIAGNOSIS — I110 Hypertensive heart disease with heart failure: Secondary | ICD-10-CM | POA: Insufficient documentation

## 2021-01-17 DIAGNOSIS — E119 Type 2 diabetes mellitus without complications: Secondary | ICD-10-CM | POA: Diagnosis not present

## 2021-01-17 DIAGNOSIS — I5043 Acute on chronic combined systolic (congestive) and diastolic (congestive) heart failure: Secondary | ICD-10-CM | POA: Diagnosis not present

## 2021-01-17 DIAGNOSIS — Z7901 Long term (current) use of anticoagulants: Secondary | ICD-10-CM | POA: Diagnosis not present

## 2021-01-17 DIAGNOSIS — E039 Hypothyroidism, unspecified: Secondary | ICD-10-CM | POA: Insufficient documentation

## 2021-01-17 DIAGNOSIS — W260XXA Contact with knife, initial encounter: Secondary | ICD-10-CM | POA: Insufficient documentation

## 2021-01-17 MED ORDER — LIDOCAINE HCL (PF) 1 % IJ SOLN
30.0000 mL | Freq: Once | INTRAMUSCULAR | Status: AC
  Administered 2021-01-18: 30 mL
  Filled 2021-01-17: qty 30

## 2021-01-17 NOTE — ED Provider Notes (Signed)
[facility] DEPT Provider Note   CSN: [id number] Arrival date & time: 01/17/21  2123     History Chief Complaint  Patient presents with   finger laceration    Paula Stein is a 75 y.o. female.  Who is seen in the emergency department today for laceration to her left distal thumb.  States that she was using a knife in the kitchen when she accidentally cut the tip of her finger.  She is anticoagulated on Xarelto and states that she has been unable to get the bleeding to stop.  Describes it as constant oozing.  She states she tried putting her hand above her head, ice and direct pressure without hemostasis.  She denies any lightheadedness or dizziness.  HPI     Past Medical History:  Diagnosis Date   AICD (automatic cardioverter/defibrillator) present 10/08/2014   SUBQ    /    DR [name]   Cataract    CHF (congestive heart failure) (HCC)    Diabetes mellitus without complication (HCC)    GERD (gastroesophageal reflux disease)    History of cardiac cath 05/05/2007   normal-with patent coronaries   History of colonoscopy    History of hiatal hernia    Hyperlipidemia    Hypertension    Iatrogenic thyroiditis    Non-ischemic cardiomyopathy (HCC)    EF 28%- reassessment of LV function 2011 with LVEf 45-50%   PAD (peripheral artery disease) (HCC)    lower extremities with ABIs of 0.5 bilaterally   Personal history of goiter    S/P radioactive iodine thyroid ablation    S/P thyroidectomy    Shortness of breath dyspnea    Sleep apnea    uses CPAP    Patient Active Problem List   Diagnosis Date Noted   Diverticulosis of colon without hemorrhage    Multiple polyps of sigmoid colon    Polyp of ascending colon    Polyp of descending colon    Rectal polyp    Lower abdominal pain    Coagulopathy ([place]) 04/13/2020   Sigmoid diverticulitis 03/15/2020   Portal vein thrombosis 03/14/2020   Acute on chronic combined systolic and diastolic CHF  (congestive heart failure) ([place]) 03/13/2018   Non-ischemic cardiomyopathy ([place])    Hypertension    Diabetes mellitus without complication ([place])    CAD (coronary artery disease) 06/18/2017   PAD (peripheral artery disease) ([place]) 06/18/2017   ICD (implantable cardioverter-defibrillator) in place 06/18/2017   Chronic combined systolic and diastolic CHF (congestive heart failure) ([place]) 05/22/2017   Hyperlipidemia associated with type 2 diabetes mellitus ([place]) 12/11/2016   Treatment-emergent central sleep apnea 09/20/2016   NICM (nonischemic cardiomyopathy) ([place]) 10/08/2014   AICD (automatic cardioverter/defibrillator) present 10/08/2014   Cigarette smoker 10/30/2012   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   DeQuervain's disease (tenosynovitis) 09/01/2010   Atherosclerosis 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 ([place]) 12/07/2008   Diabetes ([place]) 10/29/2008   Hypothyroidism 01/31/2007   Essential hypertension 01/31/2007   GERD 01/31/2007    Past Surgical History:  Procedure Laterality Date   BIOPSY  10/13/2020   Procedure: BIOPSY;  Surgeon: [name], DO;  Location: [facility];  Service: Gastroenterology;;   CATARACT EXTRACTION Bilateral    COLONOSCOPY WITH PROPOFOL N/A 10/13/2020   Procedure: COLONOSCOPY WITH PROPOFOL;  Surgeon: [name], DO;  Location: [facility];  Service: Gastroenterology;  Laterality: N/A;   EP IMPLANTABLE DEVICE N/A 10/08/2014   Procedure: SubQ ICD Implant;  Surgeon: Revonda Standard  [name], MD;  Location: [facility];  Service: Cardiovascular;  Laterality: N/A;   POLYPECTOMY  10/13/2020   Procedure: POLYPECTOMY;  Surgeon: [name], DO;  Location: [facility];  Service: Gastroenterology;;   [name] ICD CHANGEOUT N/A 11/14/2019   Procedure: BWIO ICD CHANGEOUT;  Surgeon: [name], MD;  Location: [facility];  Service: Cardiovascular;  Laterality: N/A;   THYROIDECTOMY     TONSILLECTOMY     TOTAL  ABDOMINAL HYSTERECTOMY       OB History   No obstetric history on file.     Family History  Problem Relation Age of Onset   Diabetes type II Mother    Hypertension Mother    Diabetes Mother    Heart disease Mother    Other Father        deceased from accident age 58   Hyperlipidemia Father    Breast cancer Neg Hx    Stomach cancer Neg Hx    Colon cancer Neg Hx    Esophageal cancer Neg Hx    Pancreatic cancer Neg Hx     Social History   Tobacco Use   Smoking status: Light Smoker    Packs/day: 0.25    Years: 40.00    Pack years: 10.00    Types: Cigarettes   Smokeless tobacco: Never   Tobacco comments:    on and off,currently a pack / week ( 10/05/20)  Vaping Use   Vaping Use: Never used  Substance Use Topics   Alcohol use: Yes    Alcohol/week: 0.0 standard drinks    Comment: rare glass of wine   Drug use: No    Home Medications Prior to Admission medications   Medication Sig Start Date End Date Taking? Authorizing Provider  Accu-Chek Softclix Lancets lancets Use to check blood sugar daily 02/06/20   [name], MD  acetaminophen (TYLENOL) 500 MG tablet Take 1 tablet (500 mg total) by mouth every 6 (six) hours as needed. 09/29/18   Zigmund Gottron, NP  allopurinol (ZYLOPRIM) 100 MG tablet Take 1 tablet (100 mg total) by mouth daily. 10/06/20   [name], MD  atorvastatin (LIPITOR) 40 MG tablet Take 1 tablet (40 mg total) by mouth daily. 01/16/20   [name], MD  Blood Glucose Monitoring Suppl (ACCU-CHEK GUIDE) w/Device KIT 1 each by Does not apply route daily. Use to check blood sugar daily 11/26/19   [name], MD  carvedilol (COREG) 25 MG tablet Take 1 tablet (25 mg total) by mouth 2 (two) times daily with a meal. 01/10/21   [name], MD  dicyclomine (BENTYL) 10 MG capsule Take 1 capsule (10 mg total) by mouth every 6 (six) hours as needed for up to 14 days for spasms (abdominal pain). 10/13/20 10/27/20  [name], Vito V, DO  furosemide  (LASIX) 40 MG tablet Take 1 tablet (40 mg total) by mouth 2 (two) times daily. 01/23/20   [name], MD  gabapentin (NEURONTIN) 100 MG capsule See admin instructions. 11/29/20   [provider]  glipiZIDE (GLUCOTROL XL) 2.5 MG 24 hr tablet TAKE 1 TABLET(2.5 MG) BY MOUTH DAILY WITH BREAKFAST 12/16/20   [name], MD  glucose blood (ACCU-CHEK GUIDE) test strip Use to check blood sugar daily 01/27/20   [name], MD  hydroxychloroquine (PLAQUENIL) 200 MG tablet 1 tablet with food or milk    [provider]  isosorbide mononitrate (IMDUR) 30 MG 24 hr tablet  TAKE 1 TABLET(30 MG) BY MOUTH DAILY 05/14/19   [name], MD  levothyroxine (SYNTHROID) 125 MCG tablet Take 1 tab by mouth every morning( on empty stomach) 30 minutes before taking other medications or before breakfast 11/12/20   [name], MD  omeprazole (PRILOSEC) 40 MG capsule Take 1 capsule (40 mg total) by mouth daily as needed (acid reflux). 03/17/20   Aline August, MD  rivaroxaban (XARELTO) 20 MG TABS tablet Take 1 tablet (20 mg total) by mouth daily with supper. 11/18/20   Magrinat, Virgie Dad, MD  sacubitril-valsartan (ENTRESTO) 49-51 MG Take 1 tablet by mouth 2 (two) times daily. 07/23/20   [name], MD  spironolactone (ALDACTONE) 50 MG tablet TAKE 1 TABLET(50 MG) BY MOUTH DAILY 04/14/20   [name], MD    Allergies    Ibuprofen  Review of Systems   Review of Systems  Skin:  Positive for wound.  All other systems reviewed and are negative.  Physical Exam Updated Vital Signs BP (!) 152/72 (BP Location: Right Arm)   Pulse 65   Temp (!) 97.4 F (36.3 C) (Oral)   Resp 16   SpO2 95%   Physical Exam Vitals and nursing note reviewed.  Constitutional:      General: She is not in acute distress.    Appearance: Normal appearance.  HENT:     Head: Normocephalic and atraumatic.     Mouth/Throat:     Pharynx: Oropharynx is clear.  Eyes:     General: No scleral  icterus. Cardiovascular:     Pulses: Normal pulses.  Pulmonary:     Effort: Pulmonary effort is normal. No respiratory distress.  Musculoskeletal:        General: Normal range of motion.  Skin:    General: Skin is warm and dry.     Capillary Refill: Capillary refill takes less than 2 seconds.     Findings: Laceration present. No rash.          Comments: 1.5cm x 58m laceration to distal left thumb, with small amount of oozing present   Neurological:     General: No focal deficit present.     Mental Status: She is alert and oriented to person, place, and time. Mental status is at baseline.  Psychiatric:        Mood and Affect: Mood normal.        Behavior: Behavior normal.        Thought Content: Thought content normal.        Judgment: Judgment normal.    ED Results / Procedures / Treatments   Labs (all labs ordered are listed, but only abnormal results are displayed) Labs Reviewed - No data to display  EKG None  Radiology No results found.  Procedures ..Marland Kitchenaceration Repair  Date/Time: 01/18/2021 12:19 AM Performed by: AMickie Hillier PA-C Authorized by: AMickie Hillier PA-C   Consent:    Consent obtained:  Verbal   Consent given by:  Patient   Risks, benefits, and alternatives were discussed: yes     Risks discussed:  Infection and pain   Alternatives discussed:  No treatment and delayed treatment Universal protocol:    Procedure explained and questions answered to patient or proxy's satisfaction: yes     Relevant documents present and verified: yes     Test results available: yes     Imaging studies available: yes     Required blood products, implants, devices, and special equipment available: yes  Site/side marked: yes     Immediately prior to procedure, a time out was called: yes     Patient identity confirmed:  Verbally with patient Anesthesia:    Anesthesia method:  Local infiltration   Local anesthetic:  Lidocaine 1% w/o epi Laceration details:     Location:  Finger   Finger location:  L thumb   Length (cm):  1.5   Depth (mm):  3 Pre-procedure details:    Preparation:  Patient was prepped and draped in usual sterile fashion Exploration:    Limited defect created (wound extended): no     Hemostasis achieved with:  Direct pressure   Wound exploration: wound explored through full range of motion and entire depth of wound visualized     Wound extent: areolar tissue violated   Treatment:    Area cleansed with:  Povidone-iodine and saline   Amount of cleaning:  Standard   Irrigation solution:  Sterile saline   Visualized foreign bodies/material removed: yes     Debridement:  Minimal   Undermining:  None   Scar revision: no   Skin repair:    Repair method:  Sutures   Suture size:  5-0   Suture material:  Prolene   Suture technique:  Simple interrupted   Number of sutures:  5 Approximation:    Approximation:  Close Repair type:    Repair type:  Simple Post-procedure details:    Dressing:  Open (no dressing)   Procedure completion:  Tolerated   Medications Ordered in ED Medications  lidocaine (PF) (XYLOCAINE) 1 % injection 30 mL (30 mLs Infiltration Given 01/18/21 0007)  Tdap (BOOSTRIX) injection 0.5 mL (0.5 mLs Intramuscular Given 01/18/21 0040)    ED Course  I have reviewed the triage vital signs and the nursing notes.  Pertinent labs & imaging results that were available during my care of the patient were reviewed by me and considered in my medical decision making (see chart for details).    MDM Rules/Calculators/A&P 75 year old female presents emergency department for laceration to left thumb on Xarelto. Unable to get bleeding to stop at home with direct pressure. She is not tachycardic.  She does not complain of lightheadedness or dizziness.  There is no evidence of arterial bleeding on exam.  There is a small amount of oozing still coming from the laceration. Sutured as described above. Updated tetanus here in the  emergency department Discussed she may be sore over the next few days and can use Tylenol as needed and discussed general suture care.  Needs to return in 7 to 10 days to emergency department, urgent care, primary care for suture removal.  Discussed that she should return for increasing swelling more proximally, fever, purulent discharge. I have answered all of her questions at this time.  She verbalizes understanding with teach back. Final Clinical Impression(s) / ED Diagnoses Final diagnoses:  Laceration of left thumb without foreign body without damage to nail, initial encounter    Rx / DC Orders ED Discharge Orders     None        Mickie Hillier, PA-C 01/18/21 0119    Drenda Freeze, MD 01/20/21 [id number]

## 2021-01-17 NOTE — ED Triage Notes (Signed)
Patient was cutting ham and sliced her left thumb. She is on blood thinners and was worried. 1cm incision to the edge of her thumb. Bleeding controlled at this time. She cut it at 530pm.

## 2021-01-18 ENCOUNTER — Other Ambulatory Visit: Payer: Self-pay | Admitting: Family Medicine

## 2021-01-18 DIAGNOSIS — Z1231 Encounter for screening mammogram for malignant neoplasm of breast: Secondary | ICD-10-CM

## 2021-01-18 MED ORDER — TETANUS-DIPHTH-ACELL PERTUSSIS 5-2.5-18.5 LF-MCG/0.5 IM SUSY
0.5000 mL | PREFILLED_SYRINGE | Freq: Once | INTRAMUSCULAR | Status: AC
  Administered 2021-01-18: 0.5 mL via INTRAMUSCULAR
  Filled 2021-01-18: qty 0.5

## 2021-01-18 NOTE — Discharge Instructions (Addendum)
You were seen in the emergency department today for a finger laceration to your left thumb. While you were here we placed 5 sutures to your thumb to help get the bleeding to stop. You will need these sutures removed in the next 7-10 days. Please go to primary care, urgent care or return to the emergency department to have them removed. In the mean time I have attached some instructions on suture care. Please do not scrub the area, but you can use gentle soap and water and pat dry. Please return to the emergency department if you have recurrence of bleeding or you have pus-like drainage from the sutures, fever, increasing swelling of the finger

## 2021-01-25 ENCOUNTER — Other Ambulatory Visit: Payer: Self-pay | Admitting: Family Medicine

## 2021-01-25 ENCOUNTER — Other Ambulatory Visit: Payer: Self-pay

## 2021-01-25 MED ORDER — CARVEDILOL 25 MG PO TABS: 25.0000 mg | ORAL_TABLET | Freq: Two times a day (BID) | ORAL | 0 refills | Status: DC

## 2021-01-28 ENCOUNTER — Ambulatory Visit: Payer: Medicare PPO | Admitting: Family Medicine

## 2021-01-28 ENCOUNTER — Ambulatory Visit (INDEPENDENT_AMBULATORY_CARE_PROVIDER_SITE_OTHER): Payer: Medicare PPO

## 2021-01-28 ENCOUNTER — Other Ambulatory Visit: Payer: Self-pay

## 2021-01-28 VITALS — BP 138/86 | HR 63 | Temp 97.8°F | Wt 227.4 lb

## 2021-01-28 DIAGNOSIS — Z4802 Encounter for removal of sutures: Secondary | ICD-10-CM | POA: Diagnosis not present

## 2021-01-28 DIAGNOSIS — I428 Other cardiomyopathies: Secondary | ICD-10-CM | POA: Diagnosis not present

## 2021-01-28 LAB — CUP PACEART REMOTE DEVICE CHECK
Battery Remaining Percentage: 88 %
Date Time Interrogation Session: 20221028071800
Implantable Lead Implant Date: 20160707
Implantable Lead Location: 753862
Implantable Lead Model: 3401
Implantable Pulse Generator Implant Date: 20210813
Pulse Gen Serial Number: 143555

## 2021-01-28 NOTE — Progress Notes (Signed)
Subjective:    Patient ID: Paula Stein, female    DOB: 1945-05-06, 76 y.o.   MRN: [id number]  Chief Complaint  Patient presents with   Suture / Staple Removal    Left thumb suture removal.    HPI Patient was seen today for suture removal.  Pt with L thumb 10/17  after cutting a piece of country ham.   Sen in ED that night as finger would not stop bleeding.  Received 5 stitches.  Denies pain, fever, chills, drainage or erythema of finger.  Past Medical History:  Diagnosis Date   AICD (automatic cardioverter/defibrillator) present 10/08/2014   SUBQ    /    DR [name]   Cataract    CHF (congestive heart failure) (HCC)    Diabetes mellitus without complication ([place])    GERD (gastroesophageal reflux disease)    History of cardiac cath 05/05/2007   normal-with patent coronaries   History of colonoscopy    History of hiatal hernia    Hyperlipidemia    Hypertension    Iatrogenic thyroiditis    Non-ischemic cardiomyopathy ([place])    EF 28%- reassessment of LV function 2011 with LVEf 45-50%   PAD (peripheral artery disease) (HCC)    lower extremities with ABIs of 0.5 bilaterally   Personal history of goiter    S/P radioactive iodine thyroid ablation    S/P thyroidectomy    Shortness of breath dyspnea    Sleep apnea    uses CPAP    Allergies  Allergen Reactions   Ibuprofen Nausea Only and Other (See Comments)    ROS General: Denies fever, chills, night sweats, changes in weight, changes in appetite HEENT: Denies headaches, ear pain, changes in vision, rhinorrhea, sore throat CV: Denies CP, palpitations, SOB, orthopnea Pulm: Denies SOB, cough, wheezing GI: Denies abdominal pain, nausea, vomiting, diarrhea, constipation GU: Denies dysuria, hematuria, frequency, vaginal discharge Msk: Denies muscle cramps, joint pains Neuro: Denies weakness, numbness, tingling Skin: Denies rashes, bruising +laceration of L thumb with sutures in place Psych: Denies depression, anxiety,  hallucinations    Objective:    Blood pressure 138/86, pulse 63, temperature 97.8 F (36.6 C), temperature source Oral, weight 227 lb 6.4 oz (103.1 kg), SpO2 99 %.   Gen. Pleasant, well-nourished, in no distress, normal affect   HEENT: [place]/AT, face symmetric, conjunctiva clear, no scleral icterus, PERRLA, EOMI, nares patent without drainage Lungs: no accessory muscle use Cardiovascular: RRR, no m/r/g, no peripheral edema Musculoskeletal: No deformities, no cyanosis or clubbing, normal tone Neuro:  A&Ox3, CN II-XII intact, normal gait Skin:  Warm, dry, intact.  L thumb with 5 blue sutures in place, wound edges together.  Sutures removed without difficulty.   Wt Readings from Last 3 Encounters:  01/28/21 227 lb 6.4 oz (103.1 kg)  01/14/21 216 lb 12.8 oz (98.3 kg)  01/13/21 215 lb 12.8 oz (97.9 kg)    Lab Results  Component Value Date   WBC 5.3 03/31/2020   HGB 13.4 03/31/2020   HCT 41.2 03/31/2020   PLT 299.0 03/31/2020   GLUCOSE 107 (H) 03/31/2020   CHOL 228 (H) 02/06/2019   TRIG 165 (H) 02/06/2019   HDL 91 02/06/2019   LDLDIRECT 195.2 02/15/2011   LDLCALC 109 (H) 02/06/2019   ALT 11 03/15/2020   AST 11 (L) 03/15/2020   NA 140 03/31/2020   K 4.9 03/31/2020   CL 108 03/31/2020   CREATININE 1.46 (H) 03/31/2020   BUN 22 03/31/2020   CO2 23 03/31/2020  TSH 5.23 (H) 03/31/2020   INR 0.9 10/01/2014   HGBA1C 6.0 (A) 01/14/2021   MICROALBUR 33.5 (H) 06/02/2015    Assessment/Plan:  Encounter for removal of sutures -Consent obtained.  5 sutures removed from left thumb without difficulty.  Patient tolerated procedure well. -Discussed keeping area clean and dry, monitoring for S/S of infection.  Advised to avoid scrubbing finger or activities that would increase attention at site of laceration. -Given handout  F/u as needed  Grier Mitts, MD

## 2021-02-07 NOTE — Progress Notes (Signed)
Remote ICD transmission.   

## 2021-02-09 ENCOUNTER — Encounter: Payer: Self-pay | Admitting: Family Medicine

## 2021-02-16 DIAGNOSIS — Z1231 Encounter for screening mammogram for malignant neoplasm of breast: Secondary | ICD-10-CM

## 2021-03-01 DIAGNOSIS — R768 Other specified abnormal immunological findings in serum: Secondary | ICD-10-CM | POA: Diagnosis not present

## 2021-03-01 DIAGNOSIS — M064 Inflammatory polyarthropathy: Secondary | ICD-10-CM | POA: Diagnosis not present

## 2021-03-01 DIAGNOSIS — M109 Gout, unspecified: Secondary | ICD-10-CM | POA: Diagnosis not present

## 2021-03-09 ENCOUNTER — Other Ambulatory Visit: Payer: Self-pay | Admitting: *Deleted

## 2021-03-10 ENCOUNTER — Ambulatory Visit
Admission: RE | Admit: 2021-03-10 | Discharge: 2021-03-10 | Disposition: A | Discharge status: Home / Self Care | Payer: Medicare PPO | Source: Ambulatory Visit | Attending: Family Medicine | Admitting: Family Medicine

## 2021-03-10 DIAGNOSIS — Z1231 Encounter for screening mammogram for malignant neoplasm of breast: Secondary | ICD-10-CM | POA: Diagnosis not present

## 2021-03-14 ENCOUNTER — Other Ambulatory Visit: Payer: Self-pay | Admitting: Family Medicine

## 2021-03-14 DIAGNOSIS — E039 Hypothyroidism, unspecified: Secondary | ICD-10-CM

## 2021-03-15 MED ORDER — CARVEDILOL 25 MG PO TABS: 25.0000 mg | ORAL_TABLET | Freq: Two times a day (BID) | ORAL | 0 refills | Status: DC

## 2021-03-29 ENCOUNTER — Encounter: Payer: Medicare PPO | Admitting: Internal Medicine

## 2021-04-29 ENCOUNTER — Ambulatory Visit (INDEPENDENT_AMBULATORY_CARE_PROVIDER_SITE_OTHER): Payer: Medicare HMO

## 2021-04-29 DIAGNOSIS — I428 Other cardiomyopathies: Secondary | ICD-10-CM | POA: Diagnosis not present

## 2021-04-29 LAB — CUP PACEART REMOTE DEVICE CHECK
Battery Remaining Percentage: 85 %
Date Time Interrogation Session: 20230127070200
Implantable Lead Implant Date: 20160707
Implantable Lead Location: 753862
Implantable Lead Model: 3401
Implantable Pulse Generator Implant Date: 20210813
Pulse Gen Serial Number: 143555

## 2021-05-09 NOTE — Progress Notes (Signed)
Remote ICD transmission.   

## 2021-05-30 ENCOUNTER — Other Ambulatory Visit: Payer: Self-pay

## 2021-05-30 MED ORDER — ENTRESTO 49-51 MG PO TABS: 1.0000 | ORAL_TABLET | Freq: Two times a day (BID) | ORAL | 1 refills | Status: DC

## 2021-05-31 ENCOUNTER — Encounter: Payer: Medicare PPO | Admitting: Internal Medicine

## 2021-06-06 ENCOUNTER — Encounter: Payer: Medicare HMO | Admitting: Internal Medicine

## 2021-06-06 ENCOUNTER — Other Ambulatory Visit: Payer: Self-pay

## 2021-06-06 DIAGNOSIS — Z79899 Other long term (current) drug therapy: Secondary | ICD-10-CM | POA: Diagnosis not present

## 2021-06-06 DIAGNOSIS — M25551 Pain in right hip: Secondary | ICD-10-CM | POA: Diagnosis not present

## 2021-06-06 DIAGNOSIS — M1990 Unspecified osteoarthritis, unspecified site: Secondary | ICD-10-CM | POA: Diagnosis not present

## 2021-06-06 DIAGNOSIS — M25562 Pain in left knee: Secondary | ICD-10-CM | POA: Diagnosis not present

## 2021-06-06 DIAGNOSIS — M109 Gout, unspecified: Secondary | ICD-10-CM | POA: Diagnosis not present

## 2021-06-06 DIAGNOSIS — M064 Inflammatory polyarthropathy: Secondary | ICD-10-CM | POA: Diagnosis not present

## 2021-06-06 DIAGNOSIS — I10 Essential (primary) hypertension: Secondary | ICD-10-CM | POA: Diagnosis not present

## 2021-06-06 DIAGNOSIS — M25561 Pain in right knee: Secondary | ICD-10-CM | POA: Diagnosis not present

## 2021-06-08 ENCOUNTER — Encounter: Payer: Self-pay | Admitting: Internal Medicine

## 2021-06-08 ENCOUNTER — Ambulatory Visit (INDEPENDENT_AMBULATORY_CARE_PROVIDER_SITE_OTHER): Payer: Medicare HMO | Admitting: Internal Medicine

## 2021-06-08 ENCOUNTER — Other Ambulatory Visit: Payer: Self-pay

## 2021-06-08 VITALS — BP 164/80 | HR 77 | Ht 70.0 in | Wt 230.0 lb

## 2021-06-08 DIAGNOSIS — I422 Other hypertrophic cardiomyopathy: Secondary | ICD-10-CM | POA: Diagnosis not present

## 2021-06-08 NOTE — Patient Instructions (Addendum)
Medication Instructions:  ?Your physician has recommended you make the following change in your medication:  ?INCREASE Entresto to  49/51 twice daily ? ?*If you need a refill on your cardiac medications before your next appointment, please call your pharmacy* ? ? ?Lab Work: ?None ordered ? ? ?Testing/Procedures: ?Your physician has requested that you have a cardiac MRI. Cardiac MRI uses a computer to create images of your heart as its beating, producing both still and moving pictures of your heart and major blood vessels.  ? The office will call to schedule this image testing ? ? ?Follow-Up: ?At [facility], you and your health needs are our priority.  As part of our continuing mission to provide you with exceptional heart care, we have created designated Provider Care Teams.  These Care Teams include your primary Cardiologist (physician) and Advanced Practice Providers (APPs -  Physician Assistants and Nurse Practitioners) who all work together to provide you with the care you need, when you need it. ? ?Remote monitoring is used to monitor your Pacemaker or ICD from home. This monitoring reduces the number of office visits required to check your device to one time per year. It allows [place] to keep an eye on the functioning of your device to ensure it is working properly. You are scheduled for a device check from home on 07/29/2021. You may send your transmission at any time that day. If you have a wireless device, the transmission will be sent automatically. After your physician reviews your transmission, you will receive a postcard with your next transmission date. ? ?Your next appointment:   ?1 year(s) ? ?The format for your next appointment:   ?In Person ? ?Provider:   ?You will see one of the following Advanced Practice Providers on your designated Care Team:   ?Tommye Standard, PA-C ?Legrand Como "Jonni Sanger" [place], PA-C ? ? ?Thank you for choosing [facility]!! ? ? ?

## 2021-06-08 NOTE — Progress Notes (Signed)
? ? ? ? ?Patient Care Team: ?[name], MD as PCP - General (Family Medicine) ?[name], MD as PCP - Cardiology (Cardiology) ?[name], MD as PCP - Electrophysiology (Cardiology) ?[name], DO as Consulting Physician (Gastroenterology) ? ? ?HPI ? ?Paula Stein is a 76 y.o. 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 Device suffered class I advisory recall for early battery depletion  which prompted device generator replacement 8/21 ? ?Site has healed well  ? ?Hospitalized for abdominal pain 12/21.  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  Discharged on anticoagulation ? ?The patient denies chest pain,   nocturnal dyspnea, orthopnea or peripheral edema.  There have been no palpitations, lightheadedness or syncope.  Exercise is limited because of weight and orthopedic issues.  Weight continues to climb.,  About 15 pounds in the last 4 months. ? ? ?DATE TEST EF   ?4/16 Echo  30-35 % Severe LVH/LAE  ?2/19 Echo 25-30% Severe LVH  ?3/20 Echo  30-35%   ?5/22 Echo  30-35% LVH severe 21/9m  ?  ?Date Cr K Hgb TSH  ?12/17 1.02 4.4  6.34  ?9/18 1.22 4.4  39.32  ?6/19 0.99 4.3  2.78  ?6/20  1.48 4.1  (12/19) 4.6  ?5/21 1.21 4.2  0.06  ?12/21 1.46 4.9 13.4 5.23  ? ?  ? ? hyperthyroidism s/p RAI ablation, diabetes, recent down titration 5/21 ? ?Past Medical History:  ?Diagnosis Date  ? AICD (automatic cardioverter/defibrillator) present 10/08/2014  ? SUBQ    /    DR KCaryl Comes ? Cataract   ? CHF (congestive heart failure) ([facility]   ? Diabetes mellitus without complication ([facility]   ? GERD (gastroesophageal reflux disease)   ? History of cardiac cath 05/05/2007  ? normal-with patent coronaries  ? History of colonoscopy   ? History of hiatal hernia   ? Hyperlipidemia   ? Hypertension   ? Iatrogenic thyroiditis   ?  Non-ischemic cardiomyopathy ([place]   ? EF 28%- reassessment of LV function 2011 with LVEf 45-50%  ? PAD (peripheral artery disease) ([facility]   ? lower extremities with ABIs of 0.5 bilaterally  ? Personal history of goiter   ? S/P radioactive iodine thyroid ablation   ? S/P thyroidectomy   ? Shortness of breath dyspnea   ? Sleep apnea   ? uses CPAP  ? ? ?Past Surgical History:  ?Procedure Laterality Date  ? BIOPSY  10/13/2020  ? Procedure: BIOPSY;  Surgeon: CLavena Bullion DO;  Location: [facility];  Service: Gastroenterology;;  ? CATARACT EXTRACTION Bilateral   ? COLONOSCOPY WITH PROPOFOL N/A 10/13/2020  ? Procedure: COLONOSCOPY WITH PROPOFOL;  Surgeon: CLavena Bullion DO;  Location: [facility];  Service: Gastroenterology;  Laterality: N/A;  ? EP IMPLANTABLE DEVICE N/A 10/08/2014  ? Procedure: SubQ ICD Implant;  Surgeon: SDeboraha Sprang MD;  [facility];  Service: Cardiovascular;  Laterality: N/A;  ? POLYPECTOMY  10/13/2020  ? Procedure: POLYPECTOMY;  Surgeon: CLavena Bullion DO;  Location: [facility];  Service: Gastroenterology;;  ? SUBQ ICD CHANGEOUT N/A 11/14/2019  ? Procedure: SUBQ ICD CHANGEOUT;  Surgeon: KDeboraha Sprang MD;  [facility];  Service: Cardiovascular;  Laterality: N/A;  ? THYROIDECTOMY    ? TONSILLECTOMY    ? TOTAL ABDOMINAL HYSTERECTOMY    ? ? ?  Current Outpatient Medications  ?Medication Sig Dispense Refill  ? ACCU-CHEK GUIDE test strip USE TO CHECK BLOOD SUGAR DAILY 100 strip 1  ? Accu-Chek Softclix Lancets lancets USE TO CHECK BLOOD SUGAR DAILY 100 each 1  ? acetaminophen (TYLENOL) 500 MG tablet Take 1 tablet (500 mg total) by mouth every 6 (six) hours as needed. 30 tablet 0  ? allopurinol (ZYLOPRIM) 100 MG tablet Take 1 tablet (100 mg total) by mouth daily. 90 tablet 1  ? Blood Glucose Monitoring Suppl (ACCU-CHEK GUIDE) w/Device KIT 1 each by Does not apply route daily. Use to check blood sugar daily 1 kit 0  ? carvedilol (COREG) 25 MG tablet Take 1  tablet (25 mg total) by mouth 2 (two) times daily with a meal. Pt needs to keep upcoming appt in Dec for further refills 60 tablet 0  ? furosemide (LASIX) 40 MG tablet Take 1 tablet (40 mg total) by mouth 2 (two) times daily. 180 tablet 0  ? glipiZIDE (GLUCOTROL XL) 2.5 MG 24 hr tablet TAKE 1 TABLET DAILY WITH BREAKFAST. 90 tablet 1  ? isosorbide mononitrate (IMDUR) 30 MG 24 hr tablet TAKE 1 TABLET(30 MG) BY MOUTH DAILY 90 tablet 2  ? levothyroxine (SYNTHROID) 125 MCG tablet TAKE 1 TABLET EVERY MORNING ON AN EMPTY STOMACH 30 MINUTES BEFORE TAKING OTHER MEDICATIONS OR BEFORE BREAKFAST 90 tablet 1  ? rivaroxaban (XARELTO) 20 MG TABS tablet Take 1 tablet (20 mg total) by mouth daily with supper. 90 tablet 4  ? sacubitril-valsartan (ENTRESTO) 49-51 MG Take 1 tablet by mouth 2 (two) times daily. 180 tablet 1  ? spironolactone (ALDACTONE) 50 MG tablet TAKE 1 TABLET(50 MG) BY MOUTH DAILY 90 tablet 0  ? ?No current facility-administered medications for this visit.  ? ? ?Allergies  ?Allergen Reactions  ? Ibuprofen Nausea Only and Other (See Comments)  ? ? ? ? ?Review of Systems negative except from HPI and PMH ? ?Physical Exam ?BP (!) 164/80   Pulse 77   Ht _0  (1.778 m)   Wt 230 lb (104.3 kg)   SpO2 98%   BMI 33.00 kg/m?  ?Well developed and well nourished in no acute distress ?HENT normal ?Neck supple with JVP-flat ?Clear ?Device pocket well healed; without hematoma or erythema.  There is no tethering  ?Regular rate and rhythm, no murmur ?Abd-soft with active BS ?No Clubbing cyanosis  edema ?Skin-warm and dry ?A & Oriented  Grossly normal sensory and motor function ? ?ECG sinus at 77 ?Intervals 23/10/38 ?Nonspecific ST-T changes ?Isolated PVC ? ?  ?Assessment and  Plan  ? ?Nonischemic cardiomyopathy ? ?Left ventricular hypertrophy-severe ? ?CHF  Chronic systolic ? ?ICD-Subcutaneous     s/p GEN change secondary to device failure ? ?Obesity ? ?Hypertension ? ?High Risk Medication Surveillance aldactone ? ?Portal Vein  thrombosis ? ?Severe LVH needs cMRI to exclude HCM for family reasons  ? ?Pt heart failure status is stable. Continue aldactone 50 ?Mg.  Electrolytes are stable. ? ?On Rivaroxaban for PVT as per notes 12/21.  OV from Dr Shelda Pal 1/22 recommendation was to continue indefinitely as attributed to inflammation from diverticulitis which can always recur ? ?BP elevated will entresto to 97/103--and continue carvedilol 25 twice daily.  ? ? ? ?

## 2021-06-09 NOTE — Addendum Note (Signed)
Addended by: Gwendlyn Deutscher on: 06/09/2021 03:59 PM ? ? Modules accepted: Orders ? ?

## 2021-06-16 ENCOUNTER — Telehealth: Payer: Self-pay | Admitting: Family Medicine

## 2021-06-16 NOTE — Chronic Care Management (AMB) (Signed)
?  Chronic Care Management  ? ?Note ? ?06/16/2021 ?Name: Paula Stein MRN: [id number] DOB: 1945-12-01 ? ?Paula Stein is a 76 y.o. year old female who is a primary care patient of [name], MD. I reached out to [name] by phone today in response to a referral sent by Paula Stein's PCP, [name], MD.  ? ?Paula Stein was given information about Chronic Care Management services today including:  ?CCM service includes personalized support from designated clinical staff supervised by her physician, including individualized plan of care and coordination with other care providers ?24/7 contact phone numbers for assistance for urgent and routine care needs. ?Service will only be billed when office clinical staff spend 20 minutes or more in a month to coordinate care. ?Only one practitioner may furnish and bill the service in a calendar month. ?The patient may stop CCM services at any time (effective at the end of the month) by phone call to the office staff. ? ? ?Patient agreed to services and verbal consent obtained.  ? ?Follow up plan: ? ? ?Paula Stein ?[employer] Scheduler  ?

## 2021-07-06 ENCOUNTER — Inpatient Hospital Stay (HOSPITAL_COMMUNITY)
Admission: EM | Admit: 2021-07-06 | Discharge: 2021-07-10 | DRG: 291 | Disposition: A | Discharge status: Home / Self Care | Payer: Medicare HMO | Attending: Internal Medicine | Admitting: Internal Medicine

## 2021-07-06 ENCOUNTER — Encounter (HOSPITAL_COMMUNITY): Payer: Self-pay

## 2021-07-06 ENCOUNTER — Other Ambulatory Visit: Payer: Self-pay

## 2021-07-06 ENCOUNTER — Emergency Department (HOSPITAL_COMMUNITY): Payer: Medicare HMO

## 2021-07-06 DIAGNOSIS — R079 Chest pain, unspecified: Secondary | ICD-10-CM | POA: Diagnosis not present

## 2021-07-06 DIAGNOSIS — Z83438 Family history of other disorder of lipoprotein metabolism and other lipidemia: Secondary | ICD-10-CM

## 2021-07-06 DIAGNOSIS — Z833 Family history of diabetes mellitus: Secondary | ICD-10-CM | POA: Diagnosis not present

## 2021-07-06 DIAGNOSIS — Z79899 Other long term (current) drug therapy: Secondary | ICD-10-CM | POA: Diagnosis not present

## 2021-07-06 DIAGNOSIS — E1122 Type 2 diabetes mellitus with diabetic chronic kidney disease: Secondary | ICD-10-CM | POA: Diagnosis present

## 2021-07-06 DIAGNOSIS — E1165 Type 2 diabetes mellitus with hyperglycemia: Secondary | ICD-10-CM | POA: Diagnosis present

## 2021-07-06 DIAGNOSIS — N1832 Chronic kidney disease, stage 3b: Secondary | ICD-10-CM | POA: Diagnosis not present

## 2021-07-06 DIAGNOSIS — I071 Rheumatic tricuspid insufficiency: Secondary | ICD-10-CM | POA: Diagnosis present

## 2021-07-06 DIAGNOSIS — I248 Other forms of acute ischemic heart disease: Secondary | ICD-10-CM | POA: Diagnosis not present

## 2021-07-06 DIAGNOSIS — F17210 Nicotine dependence, cigarettes, uncomplicated: Secondary | ICD-10-CM | POA: Diagnosis present

## 2021-07-06 DIAGNOSIS — I160 Hypertensive urgency: Secondary | ICD-10-CM | POA: Diagnosis present

## 2021-07-06 DIAGNOSIS — Z8249 Family history of ischemic heart disease and other diseases of the circulatory system: Secondary | ICD-10-CM | POA: Diagnosis not present

## 2021-07-06 DIAGNOSIS — I428 Other cardiomyopathies: Secondary | ICD-10-CM | POA: Diagnosis present

## 2021-07-06 DIAGNOSIS — M1A0790 Idiopathic chronic gout, unspecified ankle and foot, without tophus (tophi): Secondary | ICD-10-CM

## 2021-07-06 DIAGNOSIS — E890 Postprocedural hypothyroidism: Secondary | ICD-10-CM | POA: Diagnosis not present

## 2021-07-06 DIAGNOSIS — N184 Chronic kidney disease, stage 4 (severe): Secondary | ICD-10-CM | POA: Diagnosis present

## 2021-07-06 DIAGNOSIS — I509 Heart failure, unspecified: Principal | ICD-10-CM

## 2021-07-06 DIAGNOSIS — E785 Hyperlipidemia, unspecified: Secondary | ICD-10-CM | POA: Diagnosis present

## 2021-07-06 DIAGNOSIS — E039 Hypothyroidism, unspecified: Secondary | ICD-10-CM | POA: Diagnosis present

## 2021-07-06 DIAGNOSIS — I5023 Acute on chronic systolic (congestive) heart failure: Secondary | ICD-10-CM | POA: Diagnosis present

## 2021-07-06 DIAGNOSIS — I81 Portal vein thrombosis: Secondary | ICD-10-CM | POA: Diagnosis not present

## 2021-07-06 DIAGNOSIS — Z90710 Acquired absence of both cervix and uterus: Secondary | ICD-10-CM

## 2021-07-06 DIAGNOSIS — I130 Hypertensive heart and chronic kidney disease with heart failure and stage 1 through stage 4 chronic kidney disease, or unspecified chronic kidney disease: Secondary | ICD-10-CM | POA: Diagnosis not present

## 2021-07-06 DIAGNOSIS — I10 Essential (primary) hypertension: Secondary | ICD-10-CM

## 2021-07-06 DIAGNOSIS — Z86718 Personal history of other venous thrombosis and embolism: Secondary | ICD-10-CM

## 2021-07-06 DIAGNOSIS — I440 Atrioventricular block, first degree: Secondary | ICD-10-CM | POA: Diagnosis present

## 2021-07-06 DIAGNOSIS — Z7901 Long term (current) use of anticoagulants: Secondary | ICD-10-CM

## 2021-07-06 DIAGNOSIS — K219 Gastro-esophageal reflux disease without esophagitis: Secondary | ICD-10-CM | POA: Diagnosis present

## 2021-07-06 DIAGNOSIS — E119 Type 2 diabetes mellitus without complications: Secondary | ICD-10-CM | POA: Diagnosis not present

## 2021-07-06 DIAGNOSIS — Z95810 Presence of automatic (implantable) cardiac defibrillator: Secondary | ICD-10-CM | POA: Diagnosis not present

## 2021-07-06 DIAGNOSIS — I5043 Acute on chronic combined systolic (congestive) and diastolic (congestive) heart failure: Secondary | ICD-10-CM | POA: Diagnosis not present

## 2021-07-06 DIAGNOSIS — Z20822 Contact with and (suspected) exposure to covid-19: Secondary | ICD-10-CM | POA: Diagnosis not present

## 2021-07-06 DIAGNOSIS — E1151 Type 2 diabetes mellitus with diabetic peripheral angiopathy without gangrene: Secondary | ICD-10-CM | POA: Diagnosis present

## 2021-07-06 DIAGNOSIS — I472 Ventricular tachycardia, unspecified: Secondary | ICD-10-CM | POA: Diagnosis present

## 2021-07-06 DIAGNOSIS — I739 Peripheral vascular disease, unspecified: Secondary | ICD-10-CM

## 2021-07-06 DIAGNOSIS — I110 Hypertensive heart disease with heart failure: Secondary | ICD-10-CM | POA: Diagnosis not present

## 2021-07-06 DIAGNOSIS — R0602 Shortness of breath: Secondary | ICD-10-CM | POA: Diagnosis not present

## 2021-07-06 DIAGNOSIS — Z791 Long term (current) use of non-steroidal anti-inflammatories (NSAID): Secondary | ICD-10-CM

## 2021-07-06 DIAGNOSIS — D638 Anemia in other chronic diseases classified elsewhere: Secondary | ICD-10-CM | POA: Diagnosis present

## 2021-07-06 DIAGNOSIS — Z7984 Long term (current) use of oral hypoglycemic drugs: Secondary | ICD-10-CM

## 2021-07-06 DIAGNOSIS — I5022 Chronic systolic (congestive) heart failure: Secondary | ICD-10-CM

## 2021-07-06 DIAGNOSIS — Z79890 Hormone replacement therapy (postmenopausal): Secondary | ICD-10-CM

## 2021-07-06 DIAGNOSIS — E782 Mixed hyperlipidemia: Secondary | ICD-10-CM

## 2021-07-06 LAB — BASIC METABOLIC PANEL
Anion gap: 8 (ref 5–15)
BUN: 22 mg/dL (ref 8–23)
CO2: 23 mmol/L (ref 22–32)
Calcium: 8.7 mg/dL — ABNORMAL LOW (ref 8.9–10.3)
Chloride: 113 mmol/L — ABNORMAL HIGH (ref 98–111)
Creatinine, Ser: 1.47 mg/dL — ABNORMAL HIGH (ref 0.44–1.00)
GFR, Estimated: 37 mL/min — ABNORMAL LOW (ref >60)
Glucose, Bld: 132 mg/dL — ABNORMAL HIGH (ref 70–99)
Potassium: 4.5 mmol/L (ref 3.5–5.1)
Sodium: 144 mmol/L (ref 135–145)

## 2021-07-06 LAB — CBC
HCT: 38.8 % (ref 36.0–46.0)
Hemoglobin: 11.7 g/dL — ABNORMAL LOW (ref 12.0–15.0)
MCH: 31 pg (ref 26.0–34.0)
MCHC: 30.2 g/dL (ref 30.0–36.0)
MCV: 102.9 fL — ABNORMAL HIGH (ref 80.0–100.0)
Platelets: 228 10*3/uL (ref 150–400)
RBC: 3.77 MIL/uL — ABNORMAL LOW (ref 3.87–5.11)
RDW: 14.3 % (ref 11.5–15.5)
WBC: 8.1 10*3/uL (ref 4.0–10.5)
nRBC: 0 % (ref 0.0–0.2)

## 2021-07-06 LAB — BRAIN NATRIURETIC PEPTIDE: B Natriuretic Peptide: 533.5 pg/mL — ABNORMAL HIGH (ref 0.0–100.0)

## 2021-07-06 LAB — TROPONIN I (HIGH SENSITIVITY)
Troponin I (High Sensitivity): 28 ng/L — ABNORMAL HIGH (ref <18)
Troponin I (High Sensitivity): 25 ng/L — ABNORMAL HIGH (ref <18)

## 2021-07-06 MED ORDER — CARVEDILOL 12.5 MG PO TABS
25.0000 mg | ORAL_TABLET | Freq: Once | ORAL | Status: AC
  Administered 2021-07-06: 25 mg via ORAL
  Filled 2021-07-06: qty 2

## 2021-07-06 MED ORDER — NITROGLYCERIN 2 % TD OINT
1.0000 [in_us] | TOPICAL_OINTMENT | Freq: Once | TRANSDERMAL | Status: AC
  Administered 2021-07-06: 1 [in_us] via TOPICAL
  Filled 2021-07-06: qty 1

## 2021-07-06 MED ORDER — ACETAMINOPHEN 325 MG PO TABS: 650.0000 mg | ORAL_TABLET | Freq: Four times a day (QID) | ORAL | Status: DC | PRN

## 2021-07-06 MED ORDER — FUROSEMIDE 10 MG/ML IJ SOLN
40.0000 mg | Freq: Once | INTRAMUSCULAR | Status: AC
  Administered 2021-07-06: 40 mg via INTRAVENOUS
  Filled 2021-07-06: qty 4

## 2021-07-06 MED ORDER — FUROSEMIDE 10 MG/ML IJ SOLN
40.0000 mg | Freq: Two times a day (BID) | INTRAMUSCULAR | Status: DC
  Administered 2021-07-07 – 2021-07-09 (×6): 40 mg via INTRAVENOUS
  Filled 2021-07-06 (×6): qty 4

## 2021-07-06 MED ORDER — ACETAMINOPHEN 650 MG RE SUPP: 650.0000 mg | Freq: Four times a day (QID) | RECTAL | Status: DC | PRN

## 2021-07-06 NOTE — ED Notes (Signed)
Save blue and dark green in main lab ?

## 2021-07-06 NOTE — ED Provider Notes (Signed)
?[facility] DEPT ?Provider Note ? ? ?CSN: [id number] ?Arrival date & time: 07/06/21  1935 ? ?  ? ?History ? ?Chief Complaint  ?Patient presents with  ? Chest Pain  ? Shortness of Breath  ? ? ?Paula Stein is a 76 y.o. female with a history of CHF, cardiomyopathy, PAD, automatic cardioverter/defibrillator present, diabetes mellitus, sleep apnea.  Patient's last echocardiogram performed May 2022 showed EF 30 to 35%.  Presents to the emergency department for complaint of shortness of breath and chest pain.  Patient reports that her shortness of breath has been present over the last 24 hours.  Shortness of breath has gotten progressively worse over this time.  Shortness of breath is worse when laying flat or exerting herself.  Patient also endorses paroxysmal nocturnal dyspnea.  This was present despite patient wearing her CPAP machine.  Patient reports that she is compliant with her Lasix medication however ran out of her spironolactone and has not taken it in the last week. ? ?Patient reports that upon waking this morning at approximately 7 AM she noticed chest pain.  Patient describes pain as a pressure.  Pain is located midsternal and does not radiate.  Patient did endorse associated shortness of breath and diaphoresis.  Patient denies feeling her fibrillator fire today. ? ?Patient denies any palpitations, leg swelling or tenderness, cough, hemoptysis, abdominal pain, abdominal distention, nausea, vomiting, lightheadedness, syncope. ? ? ?Chest Pain ?Associated symptoms: shortness of breath   ?Associated symptoms: no abdominal pain, no back pain, no dizziness, no fever, no headache, no nausea, no palpitations and no vomiting   ?Shortness of Breath ?Associated symptoms: chest pain   ?Associated symptoms: no abdominal pain, no fever, no headaches, no neck pain, no rash and no vomiting   ? ?  ? ?Home Medications ?Prior to Admission medications   ?Medication Sig Start Date End Date Taking?  Authorizing Provider  ?ACCU-CHEK GUIDE test strip USE TO CHECK BLOOD SUGAR DAILY 03/17/21   [name], MD  ?Accu-Chek Softclix Lancets lancets USE TO CHECK BLOOD SUGAR DAILY 03/17/21   [name], MD  ?acetaminophen (TYLENOL) 500 MG tablet Take 1 tablet (500 mg total) by mouth every 6 (six) hours as needed. 09/29/18   Zigmund Gottron, NP  ?allopurinol (ZYLOPRIM) 100 MG tablet Take 1 tablet (100 mg total) by mouth daily. 10/06/20   [name], MD  ?Blood Glucose Monitoring Suppl (ACCU-CHEK GUIDE) w/Device KIT 1 each by Does not apply route daily. Use to check blood sugar daily 11/26/19   [name], MD  ?carvedilol (COREG) 25 MG tablet Take 1 tablet (25 mg total) by mouth 2 (two) times daily with a meal. Pt needs to keep upcoming appt in Dec for further refills 03/15/21   [name], MD  ?furosemide (LASIX) 40 MG tablet Take 1 tablet (40 mg total) by mouth 2 (two) times daily. 01/23/20   [name], MD  ?glipiZIDE (GLUCOTROL XL) 2.5 MG 24 hr tablet TAKE 1 TABLET DAILY WITH BREAKFAST. 03/17/21   [name], MD  ?isosorbide mononitrate (IMDUR) 30 MG 24 hr tablet TAKE 1 TABLET(30 MG) BY MOUTH DAILY 05/14/19   [name], MD  ?levothyroxine (SYNTHROID) 125 MCG tablet TAKE 1 TABLET EVERY MORNING ON AN EMPTY STOMACH 30 MINUTES BEFORE TAKING OTHER MEDICATIONS OR BEFORE BREAKFAST 03/17/21   [name], MD  ?rivaroxaban (XARELTO) 20 MG TABS tablet Take 1 tablet (20 mg total) by mouth daily with supper. 11/18/20  Magrinat, Virgie Dad, MD  ?sacubitril-valsartan (ENTRESTO) 49-51 MG Take 1 tablet by mouth 2 (two) times daily. 05/30/21   [name], MD  ?spironolactone (ALDACTONE) 50 MG tablet TAKE 1 TABLET(50 MG) BY MOUTH DAILY 04/14/20   [name], MD  ?   ? ?Allergies    ?Ibuprofen   ? ?Review of Systems   ?Review of Systems  ?Constitutional:  Negative for chills and fever.  ?Eyes:  Negative for visual disturbance.  ?Respiratory:  Positive for shortness of breath.    ?Cardiovascular:  Positive for chest pain. Negative for palpitations and leg swelling.  ?Gastrointestinal:  Negative for abdominal pain, nausea and vomiting.  ?Musculoskeletal:  Negative for back pain and neck pain.  ?Skin:  Negative for color change and rash.  ?Neurological:  Negative for dizziness, syncope, light-headedness and headaches.  ?Psychiatric/Behavioral:  Negative for confusion.   ? ?Physical Exam ?Updated Vital Signs ?BP (!) 168/83 (BP Location: Left Arm)   Pulse 84   Temp 98.3 ?F (36.8 ?C) (Oral)   Resp (!) 22   Ht _0  (1.778 m)   Wt 97.5 kg   SpO2 93%   BMI 30.85 kg/m?  ?Physical Exam ?Vitals and nursing note reviewed.  ?Constitutional:   ?   General: She is not in acute distress. ?   Appearance: She is not ill-appearing, toxic-appearing or diaphoretic.  ?HENT:  ?   Head: Normocephalic.  ?Eyes:  ?   General: No scleral icterus.    ?   Right eye: No discharge.     ?   Left eye: No discharge.  ?Neck:  ?   Vascular: JVD present.  ?   Comments: JVD noted bilaterally ?Cardiovascular:  ?   Rate and Rhythm: Normal rate.  ?   Pulses:     ?     Radial pulses are 2+ on the right side and 2+ on the left side.  ?   Comments: Trace edema to bilateral lower extremities ?Pulmonary:  ?   Effort: Tachypnea present. No bradypnea.  ?   Breath sounds: Normal breath sounds. No stridor. No wheezing, rhonchi or rales.  ?   Comments: Patient has increased after breathing.  Speaks in short sentences. ?Abdominal:  ?   General: Abdomen is protuberant. There is no distension. There are no signs of injury.  ?   Palpations: Abdomen is soft. There is no mass or pulsatile mass.  ?   Tenderness: There is no abdominal tenderness. There is no guarding or rebound.  ?Musculoskeletal:  ?   Right lower leg: Edema present.  ?   Left lower leg: Edema present.  ?Skin: ?   General: Skin is warm and dry.  ?Neurological:  ?   General: No focal deficit present.  ?   Mental Status: She is alert.  ?Psychiatric:     ?   Behavior: Behavior  is cooperative.  ? ? ?ED Results / Procedures / Treatments   ?Labs ?(all labs ordered are listed, but only abnormal results are displayed) ?Labs Reviewed  ?BASIC METABOLIC PANEL - Abnormal; Notable for the following components:  ?    Result Value  ? Chloride 113 (*)   ? Glucose, Bld 132 (*)   ? Creatinine, Ser 1.47 (*)   ? Calcium 8.7 (*)   ? GFR, Estimated 37 (*)   ? All other components within normal limits  ?CBC - Abnormal; Notable for the following components:  ? RBC 3.77 (*)   ? Hemoglobin 11.7 (*)   ?  MCV 102.9 (*)   ? All other components within normal limits  ?BRAIN NATRIURETIC PEPTIDE - Abnormal; Notable for the following components:  ? B Natriuretic Peptide 533.5 (*)   ? All other components within normal limits  ?TROPONIN I (HIGH SENSITIVITY) - Abnormal; Notable for the following components:  ? Troponin I (High Sensitivity) 28 (*)   ? All other components within normal limits  ?TROPONIN I (HIGH SENSITIVITY) - Abnormal; Notable for the following components:  ? Troponin I (High Sensitivity) 25 (*)   ? All other components within normal limits  ? ? ?EKG ?EKG Interpretation ? ?Date/Time:  Wednesday July 06 2021 19:44:41 EDT ?Ventricular Rate:  81 ?PR Interval:  218 ?QRS Duration: 103 ?QT Interval:  381 ?QTC Calculation: 443 ?R Axis:   87 ?Text Interpretation: Sinus rhythm Borderline prolonged PR interval Borderline right axis deviation Probable LVH with secondary repol abnrm poor r wave progression. no acute ischemic appearance. otherwise similar to previous Confirmed by Charlesetta Shanks [phone]) on 07/06/2021 7:59:17 PM ? ?Radiology ?DG Chest 2 View ? ?Result Date: 07/06/2021 ?CLINICAL DATA:  Chest pain.  Shortness of breath EXAM: CHEST - 2 VIEW COMPARISON:  10/18/2020 FINDINGS: Subcutaneous pacemaker unchanged in position. The heart is enlarged, increased from prior exam. There is vascular congestion and possible pulmonary edema. Fluid in the fissures with small bilateral pleural effusions. No confluent  consolidation. No pneumothorax. IMPRESSION: Findings consistent with CHF. Electronically Signed   By: Keith Rake M.D.   On: 07/06/2021 20:42   ? ?Procedures ?Procedures  ? ? ?Medications Ordered in ED ?Medications - No

## 2021-07-06 NOTE — ED Triage Notes (Signed)
Pt states that she started having SHOB yesterday and started having chest pressure this morning when she woke up. Pt reports sleeping with a CPAP at night occasionally. Pt states that the last time she felt like this she had fluid on her heart.  ?

## 2021-07-07 ENCOUNTER — Inpatient Hospital Stay (HOSPITAL_COMMUNITY): Payer: Medicare HMO

## 2021-07-07 ENCOUNTER — Encounter (HOSPITAL_COMMUNITY): Payer: Self-pay | Admitting: Internal Medicine

## 2021-07-07 DIAGNOSIS — N1832 Chronic kidney disease, stage 3b: Secondary | ICD-10-CM | POA: Diagnosis present

## 2021-07-07 DIAGNOSIS — I5023 Acute on chronic systolic (congestive) heart failure: Secondary | ICD-10-CM | POA: Diagnosis not present

## 2021-07-07 DIAGNOSIS — N184 Chronic kidney disease, stage 4 (severe): Secondary | ICD-10-CM | POA: Diagnosis present

## 2021-07-07 DIAGNOSIS — I81 Portal vein thrombosis: Secondary | ICD-10-CM | POA: Diagnosis not present

## 2021-07-07 DIAGNOSIS — E119 Type 2 diabetes mellitus without complications: Secondary | ICD-10-CM

## 2021-07-07 DIAGNOSIS — E039 Hypothyroidism, unspecified: Secondary | ICD-10-CM

## 2021-07-07 LAB — ECHOCARDIOGRAM COMPLETE
AR max vel: 2.23 cm2
AV Area VTI: 2.15 cm2
AV Area mean vel: 2.47 cm2
AV Mean grad: 2 mmHg
AV Peak grad: 4.1 mmHg
Ao pk vel: 1.01 m/s
Area-P 1/2: 3.65 cm2
Calc EF: 39.2 %
Height: 70 in
MV M vel: 2.8 m/s
MV Peak grad: 31.4 mmHg
S' Lateral: 5.2 cm
Single Plane A2C EF: 33.4 %
Single Plane A4C EF: 44.1 %
Weight: 3440 oz

## 2021-07-07 LAB — COMPREHENSIVE METABOLIC PANEL
ALT: 17 U/L (ref 0–44)
AST: 14 U/L — ABNORMAL LOW (ref 15–41)
Albumin: 2.8 g/dL — ABNORMAL LOW (ref 3.5–5.0)
Alkaline Phosphatase: 71 U/L (ref 38–126)
Anion gap: 6 (ref 5–15)
BUN: 25 mg/dL — ABNORMAL HIGH (ref 8–23)
CO2: 25 mmol/L (ref 22–32)
Calcium: 8.7 mg/dL — ABNORMAL LOW (ref 8.9–10.3)
Chloride: 110 mmol/L (ref 98–111)
Creatinine, Ser: 1.35 mg/dL — ABNORMAL HIGH (ref 0.44–1.00)
GFR, Estimated: 40 mL/min — ABNORMAL LOW (ref >60)
Glucose, Bld: 122 mg/dL — ABNORMAL HIGH (ref 70–99)
Potassium: 3.6 mmol/L (ref 3.5–5.1)
Sodium: 141 mmol/L (ref 135–145)
Total Bilirubin: 0.3 mg/dL (ref 0.3–1.2)
Total Protein: 6.8 g/dL (ref 6.5–8.1)

## 2021-07-07 LAB — CBC WITH DIFFERENTIAL/PLATELET
Abs Immature Granulocytes: 0.03 10*3/uL (ref 0.00–0.07)
Basophils Absolute: 0.1 10*3/uL (ref 0.0–0.1)
Basophils Relative: 1 %
Eosinophils Absolute: 0.1 10*3/uL (ref 0.0–0.5)
Eosinophils Relative: 1 %
HCT: 35.9 % — ABNORMAL LOW (ref 36.0–46.0)
Hemoglobin: 11.2 g/dL — ABNORMAL LOW (ref 12.0–15.0)
Immature Granulocytes: 0 %
Lymphocytes Relative: 28 %
Lymphs Abs: 2.2 10*3/uL (ref 0.7–4.0)
MCH: 31.5 pg (ref 26.0–34.0)
MCHC: 31.2 g/dL (ref 30.0–36.0)
MCV: 101.1 fL — ABNORMAL HIGH (ref 80.0–100.0)
Monocytes Absolute: 0.6 10*3/uL (ref 0.1–1.0)
Monocytes Relative: 8 %
Neutro Abs: 4.7 10*3/uL (ref 1.7–7.7)
Neutrophils Relative %: 62 %
Platelets: 201 10*3/uL (ref 150–400)
RBC: 3.55 MIL/uL — ABNORMAL LOW (ref 3.87–5.11)
RDW: 14.1 % (ref 11.5–15.5)
WBC: 7.6 10*3/uL (ref 4.0–10.5)
nRBC: 0 % (ref 0.0–0.2)

## 2021-07-07 LAB — MAGNESIUM
Magnesium: 2.1 mg/dL (ref 1.7–2.4)
Magnesium: 2.1 mg/dL (ref 1.7–2.4)

## 2021-07-07 LAB — PHOSPHORUS: Phosphorus: 2.5 mg/dL (ref 2.5–4.6)

## 2021-07-07 LAB — CBG MONITORING, ED
Glucose-Capillary: 110 mg/dL — ABNORMAL HIGH (ref 70–99)
Glucose-Capillary: 129 mg/dL — ABNORMAL HIGH (ref 70–99)

## 2021-07-07 LAB — GLUCOSE, CAPILLARY
Glucose-Capillary: 113 mg/dL — ABNORMAL HIGH (ref 70–99)
Glucose-Capillary: 127 mg/dL — ABNORMAL HIGH (ref 70–99)

## 2021-07-07 MED ORDER — SPIRONOLACTONE 25 MG PO TABS
25.0000 mg | ORAL_TABLET | Freq: Every day | ORAL | Status: DC
  Administered 2021-07-07 – 2021-07-09 (×3): 25 mg via ORAL
  Filled 2021-07-07 (×3): qty 1

## 2021-07-07 MED ORDER — SACUBITRIL-VALSARTAN 49-51 MG PO TABS
1.0000 | ORAL_TABLET | Freq: Two times a day (BID) | ORAL | Status: DC
  Filled 2021-07-07: qty 1

## 2021-07-07 MED ORDER — ISOSORBIDE MONONITRATE ER 30 MG PO TB24
30.0000 mg | ORAL_TABLET | Freq: Every day | ORAL | Status: DC
  Administered 2021-07-07 – 2021-07-10 (×4): 30 mg via ORAL
  Filled 2021-07-07 (×4): qty 1

## 2021-07-07 MED ORDER — LEVOTHYROXINE SODIUM 25 MCG PO TABS
125.0000 ug | ORAL_TABLET | Freq: Every day | ORAL | Status: DC
  Administered 2021-07-07 – 2021-07-10 (×4): 125 ug via ORAL
  Filled 2021-07-07 (×4): qty 1

## 2021-07-07 MED ORDER — CARVEDILOL 25 MG PO TABS
25.0000 mg | ORAL_TABLET | Freq: Two times a day (BID) | ORAL | Status: DC
  Administered 2021-07-07 – 2021-07-10 (×7): 25 mg via ORAL
  Filled 2021-07-07 (×3): qty 1
  Filled 2021-07-07: qty 2
  Filled 2021-07-07 (×3): qty 1

## 2021-07-07 MED ORDER — SACUBITRIL-VALSARTAN 97-103 MG PO TABS
1.0000 | ORAL_TABLET | Freq: Two times a day (BID) | ORAL | Status: DC
  Administered 2021-07-07 – 2021-07-10 (×7): 1 via ORAL
  Filled 2021-07-07 (×8): qty 1

## 2021-07-07 MED ORDER — POTASSIUM CHLORIDE CRYS ER 20 MEQ PO TBCR
40.0000 meq | EXTENDED_RELEASE_TABLET | Freq: Once | ORAL | Status: AC
Start: 2021-07-07 — End: 2021-07-07
  Administered 2021-07-07: 40 meq via ORAL
  Filled 2021-07-07: qty 2

## 2021-07-07 MED ORDER — RIVAROXABAN 20 MG PO TABS
20.0000 mg | ORAL_TABLET | Freq: Every day | ORAL | Status: DC
  Administered 2021-07-07 – 2021-07-09 (×3): 20 mg via ORAL
  Filled 2021-07-07 (×3): qty 1

## 2021-07-07 MED ORDER — MELATONIN 3 MG PO TABS: 3.0000 mg | ORAL_TABLET | Freq: Once | ORAL | Status: DC

## 2021-07-07 MED ORDER — INSULIN ASPART 100 UNIT/ML IJ SOLN
0.0000 [IU] | Freq: Three times a day (TID) | INTRAMUSCULAR | Status: DC
  Administered 2021-07-07: 1 [IU] via SUBCUTANEOUS
  Administered 2021-07-08: 2 [IU] via SUBCUTANEOUS
  Administered 2021-07-08: 3 [IU] via SUBCUTANEOUS
  Administered 2021-07-09 (×2): 1 [IU] via SUBCUTANEOUS
  Administered 2021-07-09: 3 [IU] via SUBCUTANEOUS
  Filled 2021-07-07: qty 0.09

## 2021-07-07 NOTE — Assessment & Plan Note (Signed)
?#)   History of portal vein thrombus: Documented history of such, on Xarelto.  ? ?Continue home Xarelto. ? ? ? ?

## 2021-07-07 NOTE — ED Notes (Signed)
Patient stated tech support unable to detect pacemaker too and stated "she will call someone and she will be back". ?

## 2021-07-07 NOTE — ED Notes (Signed)
ED TO INPATIENT HANDOFF REPORT ? ?Name/Age/Gender ?Paula Stein ?76 y.o. ?female ? ?Code Status ? ?  ?Code Status Orders  ?(From admission, onward)  ?  ? ? ?  ? ?  Start     Ordered  ? 07/06/21 2317  Full code  Continuous       ? 07/06/21 2317  ? ?  ?  ? ?  ? ?Code Status History   ? ? Date Active Date Inactive Code Status Order ID Comments User Context  ? 03/14/2020 1155 03/17/2020 1551 Partial Code [id number]  Jonnie Finner, DO ED  ? 11/14/2019 1016 11/14/2019 1621 Full Code [id number]  [name], MD Inpatient  ? 03/13/2018 1017 03/14/2018 2020 Full Code [id number]  Karmen Bongo, MD ED  ? 05/22/2017 1512 05/25/2017 1733 Full Code [id number]  Karmen Bongo, MD Inpatient  ? 10/08/2014 1151 10/09/2014 1452 Full Code [id number]  [name], MD Inpatient  ? 02/02/2014 2348 02/04/2014 1557 Full Code [id number]  Berle Mull, MD Inpatient  ? ?  ? ? ?Home/SNF/Other ?Home ? ?Chief Complaint ?Acute on chronic systolic (congestive) heart failure ([place]) [I50.23] ? ?Level of Care/Admitting Diagnosis ?ED Disposition   ? ? ED Disposition  ?Admit  ? Condition  ?--  ? Comment  ?Hospital Area: [facility] [[id number]] ? Level of Care: Progressive [102] ? Admit to Progressive based on following criteria: MULTISYSTEM THREATS such as stable sepsis, metabolic/electrolyte imbalance with or without encephalopathy that is responding to early treatment. ? May admit patient to [facility] or [facility] if equivalent level of care is available:: No ? Covid Evaluation: Asymptomatic - no recent exposure (last 10 days) testing not required ? Diagnosis: Acute on chronic systolic (congestive) heart failure ([place]) [[id number]] ? Admitting Physician: Rhetta Mura [[id number]] ? Attending Physician: Rhetta Mura [[id number]] ? Estimated length of stay: past midnight tomorrow ? Certification:: I certify this patient will need inpatient services for at least 2 midnights ?  ?  ? ?  ? ? ?Medical History ?Past Medical History:   ?Diagnosis Date  ? AICD (automatic cardioverter/defibrillator) present 10/08/2014  ? SUBQ    /    DR [name]  ? Cataract   ? CHF (congestive heart failure) ([place])   ? Diabetes mellitus without complication ([place])   ? GERD (gastroesophageal reflux disease)   ? History of cardiac cath 05/05/2007  ? normal-with patent coronaries  ? History of colonoscopy   ? History of hiatal hernia   ? Hyperlipidemia   ? Hypertension   ? Non-ischemic cardiomyopathy ([place])   ? EF 28%- reassessment of LV function 2011 with LVEf 45-50%  ? PAD (peripheral artery disease) ([place])   ? lower extremities with ABIs of 0.5 bilaterally  ? Personal history of goiter   ? S/P radioactive iodine thyroid ablation   ? Sleep apnea   ? uses CPAP  ? ? ?Allergies ?Allergies  ?Allergen Reactions  ? Ibuprofen Nausea Only and Other (See Comments)  ? ? ?IV Location/Drains/Wounds ?Patient Lines/Drains/Airways Status   ? ? Active Line/Drains/Airways   ? ? Name Placement date Placement time Site Days  ? Peripheral IV 07/06/21 20 G Anterior;Left;Proximal Forearm 07/06/21  2001  Forearm  1  ? Incision (Closed) 10/08/14 Flank Left 10/08/14  1951  -- 2464  ? Incision (Closed) 10/08/14 Chest Mid;Lower 10/08/14  1951  -- 2464  ? ?  ?  ? ?  ? ? ?Labs/Imaging ?Results for orders placed or performed during the hospital encounter  of 07/06/21 (from the past 48 hour(s))  ?Basic metabolic panel     Status: Abnormal  ? Collection Time: 07/06/21  7:37 PM  ?Result Value Ref Range  ? Sodium 144 135 - 145 mmol/L  ? Potassium 4.5 3.5 - 5.1 mmol/L  ? Chloride 113 (H) 98 - 111 mmol/L  ? CO2 23 22 - 32 mmol/L  ? Glucose, Bld 132 (H) 70 - 99 mg/dL  ?  Comment: Glucose reference range applies only to samples taken after fasting for at least 8 hours.  ? BUN 22 8 - 23 mg/dL  ? Creatinine, Ser 1.47 (H) 0.44 - 1.00 mg/dL  ? Calcium 8.7 (L) 8.9 - 10.3 mg/dL  ? GFR, Estimated 37 (L) >60 mL/min  ?  Comment: (NOTE) ?Calculated using the CKD-EPI Creatinine Equation (2021) ?  ? Anion gap 8 5 - 15  ?   Comment: Performed at [facility], [place] [address], [place], [place] [address]  ?CBC     Status: Abnormal  ? Collection Time: 07/06/21  7:37 PM  ?Result Value Ref Range  ? WBC 8.1 4.0 - 10.5 K/uL  ? RBC 3.77 (L) 3.87 - 5.11 MIL/uL  ? Hemoglobin 11.7 (L) 12.0 - 15.0 g/dL  ? HCT 38.8 36.0 - 46.0 %  ? MCV 102.9 (H) 80.0 - 100.0 fL  ? MCH 31.0 26.0 - 34.0 pg  ? MCHC 30.2 30.0 - 36.0 g/dL  ? RDW 14.3 11.5 - 15.5 %  ? Platelets 228 150 - 400 K/uL  ? nRBC 0.0 0.0 - 0.2 %  ?  Comment: Performed at [facility], [place] [address], [place], [place] [address]  ?Troponin I (High Sensitivity)     Status: Abnormal  ? Collection Time: 07/06/21  7:37 PM  ?Result Value Ref Range  ? Troponin I (High Sensitivity) 28 (H) <18 ng/L  ?  Comment: (NOTE) ?Elevated high sensitivity troponin I (hsTnI) values and significant  ?changes across serial measurements may suggest ACS but many other  ?chronic and acute conditions are known to elevate hsTnI results.  ?Refer to the "Links" section for chest pain algorithms and additional  ?guidance. ?Performed at [facility], [place] [address] ?[place], [place] [address] ?  ?Brain natriuretic peptide     Status: Abnormal  ? Collection Time: 07/06/21  7:38 PM  ?Result Value Ref Range  ? B Natriuretic Peptide 533.5 (H) 0.0 - 100.0 pg/mL  ?  Comment: Performed at [facility], [place] [address], [place], [place] [address]  ?Troponin I (High Sensitivity)     Status: Abnormal  ? Collection Time: 07/06/21  9:52 PM  ?Result Value Ref Range  ? Troponin I (High Sensitivity) 25 (H) <18 ng/L  ?  Comment: (NOTE) ?Elevated high sensitivity troponin I (hsTnI) values and significant  ?changes across serial measurements may suggest ACS but many other  ?chronic and acute conditions are known to elevate hsTnI results.  ?Refer to the "Links" section for chest pain algorithms and additional  ?guidance. ?Performed at [facility], [place]  [address] ?[place], [place] [address] ?  ?Magnesium     Status: None  ? Collection Time: 07/07/21  3:57 AM  ?Result Value Ref Range  ? Magnesium 2.1 1.7 - 2.4 mg/dL  ?  Comment: Performed at [facility], [place] [address], [place],  [address]  ?Magnesium     Status: None  ? Collection Time: 07/07/21  3:57 AM  ?Result Value  Ref Range  ? Magnesium 2.1 1.7 - 2.4 mg/dL  ?  Comment: Performed at [facility], [place] [address] [place], [place] [address]  ?Comprehensive metabolic panel     Status: Abnormal  ? Collection Time: 07/07/21  3:57 AM  ?Result Value Ref Range  ? Sodium 141 135 - 145 mmol/L  ? Potassium 3.6 3.5 - 5.1 mmol/L  ?  Comment: DELTA CHECK NOTED  ? Chloride 110 98 - 111 mmol/L  ? CO2 25 22 - 32 mmol/L  ? Glucose, Bld 122 (H) 70 - 99 mg/dL  ?  Comment: Glucose reference range applies only to samples taken after fasting for at least 8 hours.  ? BUN 25 (H) 8 - 23 mg/dL  ? Creatinine, Ser 1.35 (H) 0.44 - 1.00 mg/dL  ? Calcium 8.7 (L) 8.9 - 10.3 mg/dL  ? Total Protein 6.8 6.5 - 8.1 g/dL  ? Albumin 2.8 (L) 3.5 - 5.0 g/dL  ? AST 14 (L) 15 - 41 U/L  ? ALT 17 0 - 44 U/L  ? Alkaline Phosphatase 71 38 - 126 U/L  ? Total Bilirubin 0.3 0.3 - 1.2 mg/dL  ? GFR, Estimated 40 (L) >60 mL/min  ?  Comment: (NOTE) ?Calculated using the CKD-EPI Creatinine Equation (2021) ?  ? Anion gap 6 5 - 15  ?  Comment: Performed at [facility], [place] [address], [place], [place] [address]  ?CBC with Differential/Platelet     Status: Abnormal  ? Collection Time: 07/07/21  3:57 AM  ?Result Value Ref Range  ? WBC 7.6 4.0 - 10.5 K/uL  ? RBC 3.55 (L) 3.87 - 5.11 MIL/uL  ? Hemoglobin 11.2 (L) 12.0 - 15.0 g/dL  ? HCT 35.9 (L) 36.0 - 46.0 %  ? MCV 101.1 (H) 80.0 - 100.0 fL  ? MCH 31.5 26.0 - 34.0 pg  ? MCHC 31.2 30.0 - 36.0 g/dL  ? RDW 14.1 11.5 - 15.5 %  ? Platelets 201 150 - 400 K/uL  ? nRBC 0.0 0.0 - 0.2 %  ? Neutrophils Relative % 62 %  ? Neutro Abs 4.7 1.7 - 7.7 K/uL  ? Lymphocytes  Relative 28 %  ? Lymphs Abs 2.2 0.7 - 4.0 K/uL  ? Monocytes Relative 8 %  ? Monocytes Absolute 0.6 0.1 - 1.0 K/uL  ? Eosinophils Relative 1 %  ? Eosinophils Absolute 0.1 0.0 - 0.5 K/uL  ? Basophils Relative 1

## 2021-07-07 NOTE — Assessment & Plan Note (Signed)
? ?#)   Stage IIIb chronic kidney disease: Documented history of such, associated with baseline creatinine range of 1.3-1.6, with presenting serum creatinine noted to be consistent with this baseline range. ? ?Plan: will monitor strict I's and O's and daily weights.  Repeat BMP in the morning. ? ?

## 2021-07-07 NOTE — Consult Note (Addendum)
?Cardiology Consultation:  ? ?Patient ID: Paula Stein ?MRN: [id number]; DOB: 03-02-1946 ? ?Admit date: 07/06/2021 ?Date of Consult: 07/07/2021 ? ?PCP:  [name], MD ?  ?[facility] Providers ?Cardiologist:  [name], MD  ?Electrophysiologist:  Virl Axe, MD   ? ?Patient Profile:  ? ?Paula Stein is a 76 y.o. 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 Starla Link. ? ?History of Present Illness:  ? ?Paula Stein is a 76 year old female with the above history who is followed by Dr. Burt Knack and Dr. [name].  Patient has a long history of nonischemic cardiomyopathy with EF as low as 25 to 30%.  Cardiac catheterization in 2009 showed mild nonobstructive disease.  She underwent an ICD implantation but a device recall occurred and she then underwent subcutaneous ICD implantation in 2016.  Patient was last seen by Dr. Burt Knack in 07/2020 at which time she was doing well with no shortness of breath or chest pain.  She was not taking her medications as prescribed at that time.  She was taking all of her Entresto at 1 time and then taking all of her Coreg at another time during the day rather than taking them twice daily.  She was educated on the importance of taking the Entresto and Coreg twice daily as prescribed.  Repeat echo was ordered to reassess LV function and showed LVEF of 30-35% with global hypokinesis and severe asymmetric LVH of the basal/septal segment as well as normal RV with moderately reduced systolic function.  Patient was recently seen by Dr. [name] on 06/08/2021 at which time she had gained about 15 pounds in the last 4 months but denied any specific  cardiac symptoms.  Cardiac MRI was ordered to exclude hypertrophic cardiomyopathy given severe LVH on echo.  This is scheduled for/13/2023. ? ?Present[facility] ED on 07/06/2021 for further evaluation of shortness of breath and chest pain.  Patient reports dyspnea on exertion as well as new orthopnea for the past 4 to 6 months.  She also reports about a 15 pound weight gain over this time period.  She then ran out of her Lasix about 4 days ago and then dyspnea acutely worsened on Monday 05/06/2021.  Since then, she has had dyspnea both at rest and with exertion and states she will get very short of breath even just after taking a few steps.  She denies any PND or lower extremity edema.  Yesterday she started having some nonradiating midsternal chest tightness that she ranks as a 7-8/10 on the pain scale.  She states this occurs at rest and with exertion and last for about 2 minutes and then goes away.  She also reports reports this chest pain if she bends over.  She never usually has chest pain so this is why she ultimately decided come to the ED.  Patient also reports intermittent "cold sweats" recently. She denies any palpitations, lightheadedness, dizziness, syncope.  No recent fevers or illnesses.  No nasal congestion, cough, nausea, vomiting, diarrhea.  No abnormal bleeding in urine or stools. ? ?On arrival to the ED, hypertensive with BP as high as 188/105 but otherwise vitals stable. EKG showed normal sinus  rhythm, rate 81 bpm, with first-degree AV block and probable LVH but no acute ST/T changes.  High-sensitivity troponin minimally elevated and flat at 28 >> 25 consistent with demand ischemia.  BNP elevated at 533.  This x-ray showed cardiomegaly with vascular congestion and possible pulmonary edema as well as small bilateral pleural effusions consistent with CHF. WBC 8.1, Hgb 11.7, Plts 228. Na 144, K 4.5, Glucose 132, BUN 22, Cr 1.47.  Patient was started on IV Lasix and admitted for acute on  chronic CHF.  Cardiology consulted for further evaluation. ? ?At the time of this evaluation, patient resting comfortably eating breakfast.  She is currently chest pain-free. ? ? ?Past Medical History:  ?Diagnosis Date  ? AICD (automatic cardioverter/defibrillator) present 10/08/2014  ? SUBQ    /    DR [name]  ? Cataract   ? CHF (congestive heart failure) ([place])   ? Diabetes mellitus without complication ([place])   ? GERD (gastroesophageal reflux disease)   ? History of cardiac cath 05/05/2007  ? normal-with patent coronaries  ? History of colonoscopy   ? History of hiatal hernia   ? Hyperlipidemia   ? Hypertension   ? Iatrogenic thyroiditis   ? Non-ischemic cardiomyopathy ([place])   ? EF 28%- reassessment of LV function 2011 with LVEf 45-50%  ? PAD (peripheral artery disease) ([place])   ? lower extremities with ABIs of 0.5 bilaterally  ? Personal history of goiter   ? S/P radioactive iodine thyroid ablation   ? S/P thyroidectomy   ? Shortness of breath dyspnea   ? Sleep apnea   ? uses CPAP  ? ? ?Past Surgical History:  ?Procedure Laterality Date  ? BIOPSY  10/13/2020  ? Procedure: BIOPSY;  Surgeon: [name], DO;  Location: [facility];  Service: Gastroenterology;;  ? CATARACT EXTRACTION Bilateral   ? COLONOSCOPY WITH PROPOFOL N/A 10/13/2020  ? Procedure: COLONOSCOPY WITH PROPOFOL;  Surgeon: [name], DO;  Location: [facility];  Service: Gastroenterology;  Laterality: N/A;  ? EP IMPLANTABLE DEVICE N/A 10/08/2014  ? Procedure: SubQ ICD Implant;  Surgeon: [name], MD;  Location: [place] [facility];  Service: Cardiovascular;  Laterality: N/A;  ? POLYPECTOMY  10/13/2020  ? Procedure: POLYPECTOMY;  Surgeon: [name], DO;  Location: [facility];  Service: Gastroenterology;;  ? SUBQ ICD CHANGEOUT N/A 11/14/2019  ? Procedure: SUBQ ICD CHANGEOUT;  Surgeon: [name], MD;  Location: [place] [facility];  Service: Cardiovascular;  Laterality: N/A;  ? THYROIDECTOMY    ? TONSILLECTOMY    ? TOTAL  ABDOMINAL HYSTERECTOMY    ?  ? ? ? ?Inpatient Medications: ?Scheduled Meds: ? carvedilol  25 mg Oral BID WC  ? furosemide  40 mg Intravenous BID  ? insulin aspart  0-9 Units Subcutaneous TID WC  ? isosorbide mononitrate  30 mg Oral Daily  ? levothyroxine  125 mcg Oral Q0600  ? melatonin  3 mg Oral Once  ? potassium chloride  40 mEq Oral Once  ? rivaroxaban  20 mg Oral Q supper  ? sacubitril-valsartan  1 tablet Oral BID  ? spironolactone  25 mg Oral Daily  ? ?Continuous Infusions: ? ?PRN Meds: ?acetaminophen **OR** acetaminophen ? ?Allergies:    ?Allergies  ?Allergen Reactions  ? Ibuprofen Nausea Only and Other (See Comments)  ? ? ?Social History:   ?Social History  ? ?Socioeconomic History  ? Marital status: Divorced  ?  Spouse name: Not on file  ? Number of children: 1  ?  Years of education: Not on file  ? Highest education level: Not on file  ?Occupational History  ? Occupation: Retired  ?  Employer: RETIRED  ?  Comment: School teacher  ?Tobacco Use  ? Smoking status: Light Smoker  ?  Packs/day: 0.25  ?  Years: 40.00  ?  Pack years: 10.00  ?  Types: Cigarettes  ? Smokeless tobacco: Never  ? Tobacco comments:  ?  on and off,currently a pack / week ( 10/05/20)  ?Vaping Use  ? Vaping Use: Never used  ?Substance and Sexual Activity  ? Alcohol use: Yes  ?  Alcohol/week: 0.0 standard drinks  ?  Comment: rare glass of wine  ? Drug use: No  ? Sexual activity: Not Currently  ?  Birth control/protection: None  ?Other Topics Concern  ? Not on file  ?Social History Narrative  ? Retired Education officer, museum  ? Divorced - one grown son  ? current smoker   ? Alcohol use-no     ? Drug use-no   ? Regular exercise- no   ?  son - Lanika Colgate  ? ?Social Determinants of Health  ? ?Financial Resource Strain: Not on file  ?Food Insecurity: Not on file  ?Transportation Needs: No Transportation Needs  ? Lack of Transportation (Medical): No  ? Lack of Transportation (Non-Medical): No  ?Physical Activity: Insufficiently Active  ? Days of Exercise  per Week: 3 days  ? Minutes of Exercise per Session: 20 min  ?Stress: Not on file  ?Social Connections: Moderately Integrated  ? Frequency of Communication with Friends and Family: Twice a week  ? Frequency of S

## 2021-07-07 NOTE — ED Notes (Signed)
Failed to interrogate pacemaker multiple times even with Agricultural consultant. Charge RN to call tech support. P.A. Informed. ?

## 2021-07-07 NOTE — ED Notes (Signed)
Patient given refreshments to eat ?

## 2021-07-07 NOTE — Assessment & Plan Note (Signed)
?#)   Acute on chronic systolic heart failure: dx of acute decompensation on the basis of presenting 2 days of progressive shortness of breath associate with orthopnea and PND, presenting chest x-ray demonstrating pulmonary vascular congestion as well as evidence of pulmonary edema, and small bilateral pleural effusions without infiltrate or pneumothorax. This is in the context of a known history of chronic systolic heart failure, with most recent echocardiogram performed May 2022, which was notable for LVEF 30 to 35%, indeterminate diastolic parameters.  Of note, this most recent prior echocardiogram also showed evidence of moderately reduced right ventricular systolic function, thereby rendering her history of chronic biventricular heart failure.  Consequently, caution warranted with IV diuresis efforts given increased risk for element of preload dependence in the setting of right-sided systolic heart failure.  Etiology leading to presenting acutely decompensated heart failure potentially on the basis of unintentional suboptimal compliance with outpatient spironolactone, with the patient acknowledging that she has been completely off of this medication for approximately 1 week after running out of this potassium sparing diuretic medication at that time. I suspect that mildly elevated initial troponin is a consequence of underlying acutely decompensated heart failure as opposed to representing ACS causing presenting acute heart failure exacerbation, and with presenting EKG showing no evidence of acute ischemic changes.  ? ?Otherwise, patient conveys good compliance with home diuretic therapy, which consists of Lasix 40 mg p.o. twice daily, as well as good compliance with their additional home cardiac medications, which include Entresto, Coreg, Imdur.  ? ?Of note, patient received Lasix 40 mg IV x1 while in the ED today. Presentation warrants additional IV diuresis, as further detailed below, with close monitoring of  ensuing renal function, electrolytes, and volume status. As patient is already on a BB at home, will plan to continue this.  As her outpatient dose of Lasix 40 mg p.o. twice daily is the equivalent of Lasix 20 mg IV twice daily, will initially proceed with inpatient Lasix dosing of 40 mg IV twice daily. ? ? ? ?Plan: monitor strict I's & O's and daily weights. Monitor on telemetry, including trend in HR in response to diuresis, as above. Monitor continuous pulse oximetry. Repeat BMP in the morning, including for monitoring trend of potassium, bicarbonate, and renal function in response to interval diuresis efforts. Add-on serum magnesium level, and repeat this level in the AM. Close monitoring of ensuing blood pressure response to diuresis efforts, including to help guide need for improvement in afterload reduction in order to optimize cardiac output.  Lasix 40 mg IV twice daily, as above. Continue outpatient BB.  Additionally, we will continue home Entresto as well as Imdur.  Echocardiogram ordered for the morning.  Check TSH. ? ? ?

## 2021-07-07 NOTE — H&P (Signed)
?History and Physical  ? ? ?PLEASE NOTE THAT [employer] SOFTWARE WAS USED IN THE CONSTRUCTION OF THIS NOTE. ? ? ?Paula Stein XVQ:[id number] DOB: [birth year] DOA: 07/06/2021 ? ?PCP: [name], MD  ?Patient coming from: home  ? ?I have personally briefly reviewed patient's old medical records in [place] ? ?Chief Complaint: sob ? ?HPI: Paula Stein is a 76 y.o. female with medical history significant for chronic systolic heart failure, type 2 diabetes mellitus, CKD 3B associated baseline creatinine range 1.3-1.6, who is admitted to [facility] on 07/06/2021 with acute on chronic systolic heart failure after presenting from home to [facility] ED complaining of shortness of breath.  ? ?Patient reports 2 days of progressive shortness of breath associated new onset orthopnea and PND.  She reports associated increase in in the bilateral lower extremities, but notes that this does not represent a significant change from her baseline mild trace edema in the bilateral lower extremities.  No associated chest pain, diaphoresis, palpitations, nausea, vomiting, presyncope, or syncope.  Reports associated mild, nonproductive cough in the absence of hemoptysis, new lower extremity erythema, or calf tenderness.  She also denies any associated subjective fever, chills, rigors, or generalized myalgias. ? ?Patient informs that her patient diuretic regimen consists of Lasix 40 mg p.o. twice daily, with which she has been compliant, without any recent missed doses.  She also notes that her diuretic regimen as outpatient includes spironolactone, but conveys that she ran out of this medication approximately 1 week ago without any interval doses as a consequence.  She notes that approximately 5 days after her most recent dose of spironolactone, that she began to develop the above progressive acute respiratory symptoms, as further detailed above. ? ?Plan: Per chart review, his recent echocardiogram occurred in May 2022 was  associated with LVEF 30 to 35%, global left ventricular hypokinesis, indeterminate diastolic parameters, moderately reduced right ventricular systolic function, mild mitral regurgitation. ? ? ? ? ?ED Course:  ?Vital signs in the ED were notable for the following: Afebrile; heart rate 60-93; blood pressure 188/101 which subsequent improved to 150/90 following interval IV diuresis as well as administration of nitroglycerin paste, as further detailed below; respiratory rate 21-20, oxygen saturation 94 to 100% on room air. ? ?Labs were notable for the following: BMP notable for the following: Potassium 4.5, creatinine 1.47 compared to most recent prior serum creatinine data point of 1.46 in December 2021; CBC notable for white blood cell count 8100.  High-sensitivity troponin I initially noted to be 28, with repeat value trending down to 25.  BNP 533 compared to most recent prior BNP due to 0.587 in December 2019. ? ?Imaging and additional notable ED work-up: EKG shows sinus rhythm with heart rate 81, no evidence of T wave or ST changes, including no evidence of ST elevation.  Two-view chest x-ray showed evidence of pulmonary vascular congestion as well as pulmonary edema, small bilateral pleural effusions, but no evidence of infiltrate or pneumothorax. ? ?While in the ED, the following were administered: Home Coreg 25 mg p.o. x1, nitroglycerin paste for improved blood pressure control, and Lasix 40 mg IV x1. ? ?Subsequently, the patient was admitted for further evaluation management of acute on chronic systolic heart failure.  ? ? ? ?Review of Systems: As per HPI otherwise 10 point review of systems negative.  ? ?Past Medical History:  ?Diagnosis Date  ? AICD (automatic cardioverter/defibrillator) present 10/08/2014  ? SUBQ    /  DR [name]  ? Cataract   ? CHF (congestive heart failure) ([place])   ? Diabetes mellitus without complication ([employer])   ? GERD (gastroesophageal reflux disease)   ? History of cardiac cath  05/05/2007  ? normal-with patent coronaries  ? History of colonoscopy   ? History of hiatal hernia   ? Hyperlipidemia   ? Hypertension   ? Iatrogenic thyroiditis   ? Non-ischemic cardiomyopathy ([place])   ? EF 28%- reassessment of LV function 2011 with LVEf 45-50%  ? PAD (peripheral artery disease) ([place])   ? lower extremities with ABIs of 0.5 bilaterally  ? Personal history of goiter   ? S/P radioactive iodine thyroid ablation   ? S/P thyroidectomy   ? Shortness of breath dyspnea   ? Sleep apnea   ? uses CPAP  ? ? ?Past Surgical History:  ?Procedure Laterality Date  ? BIOPSY  10/13/2020  ? Procedure: BIOPSY;  Surgeon: [name], DO;  Location: [facility];  Service: Gastroenterology;;  ? CATARACT EXTRACTION Bilateral   ? COLONOSCOPY WITH PROPOFOL N/A 10/13/2020  ? Procedure: COLONOSCOPY WITH PROPOFOL;  Surgeon: [name], DO;  Location: [facility];  Service: Gastroenterology;  Laterality: N/A;  ? EP IMPLANTABLE DEVICE N/A 10/08/2014  ? Procedure: SubQ ICD Implant;  Surgeon: [name], MD;  Location: [place] [facility];  Service: Cardiovascular;  Laterality: N/A;  ? POLYPECTOMY  10/13/2020  ? Procedure: POLYPECTOMY;  Surgeon: [name], DO;  Location: [facility];  Service: Gastroenterology;;  ? SUBQ ICD CHANGEOUT N/A 11/14/2019  ? Procedure: SUBQ ICD CHANGEOUT;  Surgeon: [name], MD;  Location: [place] [facility];  Service: Cardiovascular;  Laterality: N/A;  ? THYROIDECTOMY    ? TONSILLECTOMY    ? TOTAL ABDOMINAL HYSTERECTOMY    ? ? ?Social History: ? reports that she has been smoking cigarettes. She has a 10.00 pack-year smoking history. She has never used smokeless tobacco. She reports current alcohol use. She reports that she does not use drugs. ? ? ?Allergies  ?Allergen Reactions  ? Ibuprofen Nausea Only and Other (See Comments)  ? ? ?Family History  ?Problem Relation Age of Onset  ? Diabetes type II Mother   ? Hypertension Mother   ? Diabetes Mother   ? Heart disease Mother    ? Other Father   ?     deceased from accident age 35  ? Hyperlipidemia Father   ? Breast cancer Neg Hx   ? Stomach cancer Neg Hx   ? Colon cancer Neg Hx   ? Esophageal cancer Neg Hx   ? Pancreatic cancer Neg Hx   ? ? ?Family history reviewed and not pertinent  ? ? ?Prior to Admission medications   ?Medication Sig Start Date End Date Taking? Authorizing Provider  ?ACCU-CHEK GUIDE test strip USE TO CHECK BLOOD SUGAR DAILY 03/17/21   [name], MD  ?Accu-Chek Softclix Lancets lancets USE TO CHECK BLOOD SUGAR DAILY 03/17/21   [name], MD  ?acetaminophen (TYLENOL) 500 MG tablet Take 1 tablet (500 mg total) by mouth every 6 (six) hours as needed. 09/29/18   Zigmund Gottron, NP  ?allopurinol (ZYLOPRIM) 100 MG tablet Take 1 tablet (100 mg total) by mouth daily. 10/06/20   [name], MD  ?Blood Glucose Monitoring Suppl (ACCU-CHEK GUIDE) w/Device KIT 1 each by Does not apply route daily. Use to check blood sugar daily 11/26/19   [name], MD  ?carvedilol (COREG) 25 MG tablet  Take 1 tablet (25 mg total) by mouth 2 (two) times daily with a meal. Pt needs to keep upcoming appt in Dec for further refills 03/15/21   [name], MD  ?furosemide (LASIX) 40 MG tablet Take 1 tablet (40 mg total) by mouth 2 (two) times daily. 01/23/20   [name], MD  ?glipiZIDE (GLUCOTROL XL) 2.5 MG 24 hr tablet TAKE 1 TABLET DAILY WITH BREAKFAST. 03/17/21   [name], MD  ?isosorbide mononitrate (IMDUR) 30 MG 24 hr tablet TAKE 1 TABLET(30 MG) BY MOUTH DAILY 05/14/19   [name], MD  ?levothyroxine (SYNTHROID) 125 MCG tablet TAKE 1 TABLET EVERY MORNING ON AN EMPTY STOMACH 30 MINUTES BEFORE TAKING OTHER MEDICATIONS OR BEFORE BREAKFAST 03/17/21   [name], MD  ?rivaroxaban (XARELTO) 20 MG TABS tablet Take 1 tablet (20 mg total) by mouth daily with supper. 11/18/20   Magrinat, Virgie Dad, MD  ?sacubitril-valsartan (ENTRESTO) 49-51 MG Take 1 tablet by mouth 2 (two) times daily. 05/30/21   [name], MD  ?spironolactone (ALDACTONE) 50 MG tablet TAKE 1 TABLET(50 MG) BY MOUTH DAILY 04/14/20   [name], MD  ? ? ? ?Objective  ? ? ?Physical Exam: ?Vitals:  ? 07/06/21 2345 07/07/21 0000 07/07/21 0

## 2021-07-07 NOTE — Assessment & Plan Note (Signed)
? ?#)   Type 2 Diabetes Mellitus: documented history of such. Home insulin regimen: None. Home oral hypoglycemic agents: Glipizide. presenting blood sugar: 132.  ? ? ?Plan: accuchecks QAC and HS with low dose SSI. hold home oral hypoglycemic agents during this hospitalization.  ? ? ?

## 2021-07-07 NOTE — Progress Notes (Signed)
?PROGRESS NOTE ? ? ? ?Paula Stein  AGT:[id number] DOB: 27-Jul-1945 DOA: 07/06/2021 ?PCP: [name], MD  ? ?Brief Narrative:  ?76 year old female with chronic systolic heart failure, diabetes mellitus type 2, history of portal vein thrombosis currently on Xarelto, CKD stage IIIb (baseline creatinine from 1.3-1.6) presented with worsening shortness of breath (apparently ran out of spironolactone at home a week prior to presentation).  On presentation, BNP was 533.  Chest x-ray showed evidence of pulmonary vascular congestion and pulmonary edema with small bilateral pleural effusions.  She was started on IV Lasix. ? ?Assessment & Plan: ?  ?Acute on chronic systolic heart failure ?-Presented with worsening shortness of breath and was found to have evidence of pulmonary congestion on chest x-ray with BNP of 533.  Apparently ran out of spironolactone for a week at home ?-Continue IV Lasix.  Strict input and output.  Daily weights.  Fluid restriction.  Continue Coreg, isosorbide mononitrate, Entresto and spironolactone. ?-I have consulted cardiology.  Follow recommendations. ?-Echo pending ? ?CKD stage IIIb ?-Creatinine 1.3-1.6 at baseline.  Creatinine stable.  Monitor ? ?Diabetes mellitus type 2 ?-Glipizide on hold.  CBGs with SSI ? ?Hypothyroidism ?-Continue Synthroid ? ?History of portal vein thrombosis ?-Continue Xarelto ? ?Anemia of chronic disease: Possibly from kidney disease ?Macrocytosis ?-Hemoglobin stable.  Monitor intermittently. ? ? ?DVT prophylaxis: Xarelto ?Code Status: Full ?Family Communication: None at bedside ?Disposition Plan: ?Status is: Inpatient ?Remains inpatient appropriate because: Of need for IV diuresis ? ?Consultants: Cardiology ? ?Procedures: None ? ?Antimicrobials: None ? ? ?Subjective: ?Patient seen and examined at bedside.  Feels slightly better but still short of breath with exertion.  Denies worsening fever, nausea, vomiting or cough. ? ?Objective: ?Vitals:  ? 07/07/21 0730 07/07/21  0745 07/07/21 0800 07/07/21 0900  ?BP:   (!) 172/101 (!) 124/96  ?Pulse: 76 80 77 70  ?Resp: (!) 22 19 (!) 38 13  ?Temp:      ?TempSrc:      ?SpO2: 97% 97% 90% 96%  ?Weight:      ?Height:      ? ? ?Intake/Output Summary (Last 24 hours) at 07/07/2021 1021 ?Last data filed at 07/07/2021 0525 ?Gross per 24 hour  ?Intake --  ?Output 1200 ml  ?Net -1200 ml  ? ?Filed Weights  ? 07/06/21 2001  ?Weight: 97.5 kg  ? ? ?Examination: ? ?General exam: Appears calm and comfortable.  Currently on room air. ?Respiratory system: Bilateral decreased breath sounds at bases with basilar crackles, intermittently tachypneic ?Cardiovascular system: S1 & S2 heard, Rate controlled ?Gastrointestinal system: Abdomen is nondistended, soft and nontender. Normal bowel sounds heard. ?Extremities: No cyanosis, clubbing; bilateral lower extremity edema present ?Central nervous system: Alert and oriented. No focal neurological deficits. Moving extremities ?Skin: No rashes, lesions or ulcers ?Psychiatry: Judgement and insight appear normal. Mood & affect appropriate.  ? ? ? ?Data Reviewed: I have personally reviewed following labs and imaging studies ? ?CBC: ?Recent Labs  ?Lab 07/06/21 ?1937 07/07/21 ?0357  ?WBC 8.1 7.6  ?NEUTROABS  --  4.7  ?HGB 11.7* 11.2*  ?HCT 38.8 35.9*  ?MCV 102.9* 101.1*  ?PLT 228 201  ? ?Basic Metabolic Panel: ?Recent Labs  ?Lab 07/06/21 ?1937 07/07/21 ?0357  ?NA 144 141  ?K 4.5 3.6  ?CL 113* 110  ?CO2 23 25  ?GLUCOSE 132* 122*  ?BUN 22 25*  ?CREATININE 1.47* 1.35*  ?CALCIUM 8.7* 8.7*  ?MG  --  2.1  2.1  ?PHOS  --  2.5  ? ?GFR: ?Estimated  Creatinine Clearance: 44.1 mL/min (A) (by C-G formula based on SCr of 1.35 mg/dL (H)). ?Liver Function Tests: ?Recent Labs  ?Lab 07/07/21 ?8527  ?AST 14*  ?ALT 17  ?ALKPHOS 71  ?BILITOT 0.3  ?PROT 6.8  ?ALBUMIN 2.8*  ? ?No results for input(s): LIPASE, AMYLASE in the last 168 hours. ?No results for input(s): AMMONIA in the last 168 hours. ?Coagulation Profile: ?No results for input(s): INR,  PROTIME in the last 168 hours. ?Cardiac Enzymes: ?No results for input(s): CKTOTAL, CKMB, CKMBINDEX, TROPONINI in the last 168 hours. ?BNP (last 3 results) ?No results for input(s): PROBNP in the last 8760 hours. ?HbA1C: ?No results for input(s): HGBA1C in the last 72 hours. ?CBG: ?Recent Labs  ?Lab 07/07/21 ?0745  ?GLUCAP 110*  ? ?Lipid Profile: ?No results for input(s): CHOL, HDL, LDLCALC, TRIG, CHOLHDL, LDLDIRECT in the last 72 hours. ?Thyroid Function Tests: ?No results for input(s): TSH, T4TOTAL, FREET4, T3FREE, THYROIDAB in the last 72 hours. ?Anemia Panel: ?No results for input(s): VITAMINB12, FOLATE, FERRITIN, TIBC, IRON, RETICCTPCT in the last 72 hours. ?Sepsis Labs: ?No results for input(s): PROCALCITON, LATICACIDVEN in the last 168 hours. ? ?No results found for this or any previous visit (from the past 240 hour(s)).  ? ? ? ? ? ?Radiology Studies: ?DG Chest 2 View ? ?Result Date: 07/06/2021 ?CLINICAL DATA:  Chest pain.  Shortness of breath EXAM: CHEST - 2 VIEW COMPARISON:  10/18/2020 FINDINGS: Subcutaneous pacemaker unchanged in position. The heart is enlarged, increased from prior exam. There is vascular congestion and possible pulmonary edema. Fluid in the fissures with small bilateral pleural effusions. No confluent consolidation. No pneumothorax. IMPRESSION: Findings consistent with CHF. Electronically Signed   By: Keith Rake M.D.   On: 07/06/2021 20:42   ? ? ? ? ? ?Scheduled Meds: ? carvedilol  25 mg Oral BID WC  ? furosemide  40 mg Intravenous BID  ? insulin aspart  0-9 Units Subcutaneous TID WC  ? isosorbide mononitrate  30 mg Oral Daily  ? levothyroxine  125 mcg Oral Q0600  ? melatonin  3 mg Oral Once  ? potassium chloride  40 mEq Oral Once  ? rivaroxaban  20 mg Oral Q supper  ? sacubitril-valsartan  1 tablet Oral BID  ? spironolactone  25 mg Oral Daily  ? ?Continuous Infusions: ? ? ? ? ? ? ? ?Aline August, MD ?[facility] ?07/07/2021, 10:21 AM  ? ?

## 2021-07-07 NOTE — ED Notes (Signed)
Tech Support to interrogate pacemaker at bedside.  ?

## 2021-07-07 NOTE — Assessment & Plan Note (Signed)
? ?#)   acquired hypothyroidism: documented h/o such, on Synthroid as outpatient.  This is in the setting of history of thyroidectomy.  In the setting of presenting acute on chronic systolic heart failure, will also check TSH level. ? ?Plan: cont home Synthroid.  Check TSH level, as above. ? ? ?

## 2021-07-07 NOTE — Plan of Care (Signed)
  Problem: Education: Goal: Knowledge of General Education information will improve Description: Including pain rating scale, medication(s)/side effects and non-pharmacologic comfort measures Outcome: Progressing   Problem: Nutrition: Goal: Adequate nutrition will be maintained Outcome: Progressing   Problem: Elimination: Goal: Will not experience complications related to urinary retention Outcome: Progressing   

## 2021-07-08 DIAGNOSIS — N1832 Chronic kidney disease, stage 3b: Secondary | ICD-10-CM | POA: Diagnosis not present

## 2021-07-08 DIAGNOSIS — I81 Portal vein thrombosis: Secondary | ICD-10-CM | POA: Diagnosis not present

## 2021-07-08 DIAGNOSIS — I5023 Acute on chronic systolic (congestive) heart failure: Secondary | ICD-10-CM | POA: Diagnosis not present

## 2021-07-08 DIAGNOSIS — E039 Hypothyroidism, unspecified: Secondary | ICD-10-CM | POA: Diagnosis not present

## 2021-07-08 LAB — BASIC METABOLIC PANEL
Anion gap: 9 (ref 5–15)
BUN: 31 mg/dL — ABNORMAL HIGH (ref 8–23)
CO2: 26 mmol/L (ref 22–32)
Calcium: 8.6 mg/dL — ABNORMAL LOW (ref 8.9–10.3)
Chloride: 105 mmol/L (ref 98–111)
Creatinine, Ser: 1.53 mg/dL — ABNORMAL HIGH (ref 0.44–1.00)
GFR, Estimated: 35 mL/min — ABNORMAL LOW (ref >60)
Glucose, Bld: 117 mg/dL — ABNORMAL HIGH (ref 70–99)
Potassium: 4.1 mmol/L (ref 3.5–5.1)
Sodium: 140 mmol/L (ref 135–145)

## 2021-07-08 LAB — GLUCOSE, CAPILLARY
Glucose-Capillary: 215 mg/dL — ABNORMAL HIGH (ref 70–99)
Glucose-Capillary: 116 mg/dL — ABNORMAL HIGH (ref 70–99)
Glucose-Capillary: 156 mg/dL — ABNORMAL HIGH (ref 70–99)
Glucose-Capillary: 108 mg/dL — ABNORMAL HIGH (ref 70–99)

## 2021-07-08 LAB — MAGNESIUM: Magnesium: 2.3 mg/dL (ref 1.7–2.4)

## 2021-07-08 MED ORDER — POLYETHYLENE GLYCOL 3350 17 G PO PACK: 17.0000 g | PACK | Freq: Every day | ORAL | Status: DC | PRN

## 2021-07-08 MED ORDER — TRAZODONE HCL 50 MG PO TABS
50.0000 mg | ORAL_TABLET | Freq: Every evening | ORAL | Status: DC | PRN
  Administered 2021-07-08 – 2021-07-09 (×3): 50 mg via ORAL
  Filled 2021-07-08 (×3): qty 1

## 2021-07-08 MED ORDER — BISACODYL 10 MG RE SUPP: 10.0000 mg | Freq: Every day | RECTAL | Status: DC | PRN

## 2021-07-08 MED ORDER — SENNOSIDES-DOCUSATE SODIUM 8.6-50 MG PO TABS
1.0000 | ORAL_TABLET | Freq: Two times a day (BID) | ORAL | Status: DC
  Administered 2021-07-08 – 2021-07-10 (×3): 1 via ORAL
  Filled 2021-07-08 (×4): qty 1

## 2021-07-08 NOTE — Consult Note (Signed)
[facility] CM Inpatient Consult ? ? ?07/08/2021 ? ?DOROTHIA PASSMORE ?22-Jul-1945 ?[id number] ? ?[facility] CM) ?  ?Patient chart reviewed with noted high risk score for unplanned readmission. Assessed for post hospital chronic disease management needs. ?  ?Per review, patient has primary care provider office which offers embedded chronic care management team and services.  ?  ?Plan: Will continue to follow for progression and disposition needs. ?  ?Of note, [facility] Care Management services does not replace or interfere with any services that are arranged by inpatient case management or social work.  ? ?Netta Cedars, MSN, RN ?[facility] Liaison ?Phone [phone] ?Toll free office [phone]  ?

## 2021-07-08 NOTE — Progress Notes (Addendum)
? ?Progress Note ? ?Patient Name: Paula Stein ?Date of Encounter: 07/08/2021 ? ?[facility] Cardiologist: [name], MD  ? ?Subjective  ? ?Patient denies chest pain, palpitations. Has some shortness of breath this morning and is unable to lay flat. On room air. ? ?Inpatient Medications  ?  ?Scheduled Meds: ? carvedilol  25 mg Oral BID WC  ? furosemide  40 mg Intravenous BID  ? insulin aspart  0-9 Units Subcutaneous TID WC  ? isosorbide mononitrate  30 mg Oral Daily  ? levothyroxine  125 mcg Oral Q0600  ? melatonin  3 mg Oral Once  ? rivaroxaban  20 mg Oral Q supper  ? sacubitril-valsartan  1 tablet Oral BID  ? senna-docusate  1 tablet Oral BID  ? spironolactone  25 mg Oral Daily  ? ?Continuous Infusions: ? ?PRN Meds: ?acetaminophen **OR** acetaminophen, bisacodyl, polyethylene glycol, traZODone  ? ?Vital Signs  ?  ?Vitals:  ? 07/07/21 1733 07/07/21 2054 07/08/21 0104 07/08/21 0452  ?BP: (!) 171/84 140/77 (!) 132/58 130/63  ?Pulse: 74 74 74 72  ?Resp: '16 20 19 18  '$ ?Temp: 97.6 ?F (36.4 ?C) 97.9 ?F (36.6 ?C) 97.8 ?F (36.6 ?C) 98.6 ?F (37 ?C)  ?TempSrc: Oral Oral Oral Oral  ?SpO2: 100% 100% 94% 95%  ?Weight:      ?Height:      ? ? ?Intake/Output Summary (Last 24 hours) at 07/08/2021 1052 ?Last data filed at 07/08/2021 0800 ?Gross per 24 hour  ?Intake 480 ml  ?Output 1400 ml  ?Net -920 ml  ? ? ?  07/06/2021  ?  8:01 PM 06/08/2021  ?  1:23 PM 01/28/2021  ? 10:29 AM  ?Last 3 Weights  ?Weight (lbs) 215 lb 230 lb 227 lb 6.4 oz  ?Weight (kg) 97.523 kg 104.327 kg 103.148 kg  ?   ? ?Telemetry  ?  ?Sinus rhythm, multiple short runs of NSVT, frequent PVCs - Personally Reviewed ? ?ECG  ?  ?No new tracings - Personally Reviewed ? ?Physical Exam  ? ?GEN: No acute distress.  Sitting up in the bed  ?Neck: No JVD ?Cardiac: RRR, no murmurs, rubs, or gallops.  ?Respiratory: Crackles in bilateral lung bases  ?GI: Soft, nontender, non-distended  ?MS: No edema; No deformity. ?Neuro:  Nonfocal  ?Psych: Normal affect  ? ?Labs  ?  ?High  Sensitivity Troponin:   ?Recent Labs  ?Lab 07/06/21 ?1937 07/06/21 ?2152  ?TROPONINIHS 28* 25*  ?   ?Chemistry ?Recent Labs  ?Lab 07/06/21 ?1937 07/07/21 ?5027 07/08/21 ?0346  ?NA 144 141 140  ?K 4.5 3.6 4.1  ?CL 113* 110 105  ?CO2 '23 25 26  '$ ?GLUCOSE 132* 122* 117*  ?BUN 22 25* 31*  ?CREATININE 1.47* 1.35* 1.53*  ?CALCIUM 8.7* 8.7* 8.6*  ?MG  --  2.1  2.1 2.3  ?PROT  --  6.8  --   ?ALBUMIN  --  2.8*  --   ?AST  --  14*  --   ?ALT  --  17  --   ?ALKPHOS  --  71  --   ?BILITOT  --  0.3  --   ?GFRNONAA 37* 40* 35*  ?ANIONGAP '8 6 9  '$ ?  ?Lipids No results for input(s): CHOL, TRIG, HDL, LABVLDL, LDLCALC, CHOLHDL in the last 168 hours.  ?Hematology ?Recent Labs  ?Lab 07/06/21 ?1937 07/07/21 ?0357  ?WBC 8.1 7.6  ?RBC 3.77* 3.55*  ?HGB 11.7* 11.2*  ?HCT 38.8 35.9*  ?MCV 102.9* 101.1*  ?MCH 31.0 31.5  ?  MCHC 30.2 31.2  ?RDW 14.3 14.1  ?PLT 228 201  ? ?Thyroid No results for input(s): TSH, FREET4 in the last 168 hours.  ?BNP ?Recent Labs  ?Lab 07/06/21 ?1938  ?BNP 533.5*  ?  ?DDimer No results for input(s): DDIMER in the last 168 hours.  ? ?Radiology  ?  ?DG Chest 2 View ? ?Result Date: 07/06/2021 ?CLINICAL DATA:  Chest pain.  Shortness of breath EXAM: CHEST - 2 VIEW COMPARISON:  10/18/2020 FINDINGS: Subcutaneous pacemaker unchanged in position. The heart is enlarged, increased from prior exam. There is vascular congestion and possible pulmonary edema. Fluid in the fissures with small bilateral pleural effusions. No confluent consolidation. No pneumothorax. IMPRESSION: Findings consistent with CHF. Electronically Signed   By: Keith Rake M.D.   On: 07/06/2021 20:42  ? ?ECHOCARDIOGRAM COMPLETE ? ?Result Date: 07/07/2021 ?   ECHOCARDIOGRAM REPORT   Patient Name:   Paula Stein Capo Date of Exam: 07/07/2021 Medical Rec #:  [id number]     Height:       70.0 in Accession #:    [id number]    Weight:       215.0 lb Date of Birth:  [birth year]      BSA:          2.152 m? Patient Age:    76 years      BP:           166/136 mmHg Patient Gender:  F             HR:           72 bpm. Exam Location:  Inpatient Procedure: 2D Echo, Cardiac Doppler and Color Doppler Indications:    CHF  History:        Patient has prior history of Echocardiogram examinations, most                 recent 08/12/2020. CHF and Cardiomyopathy;                 Signs/Symptoms:Shortness of Breath. AICD 10/08/14.  Sonographer:    [place] Referring Phys: [id number] [place]  1. Left ventricular ejection fraction, by estimation, is 30 to 35%. The left ventricle has moderately decreased function. The left ventricle demonstrates global hypokinesis. There is mild left ventricular hypertrophy. Left ventricular diastolic parameters are consistent with Grade I diastolic dysfunction (impaired relaxation). Elevated left ventricular end-diastolic pressure. The E/e' is 52.  2. Right ventricular systolic function is moderately reduced. The right ventricular size is normal.  3. Left atrial size was mildly dilated.  4. The pericardial effusion is posterior to the left ventricle.  5. The mitral valve is abnormal. Trivial mitral valve regurgitation.  6. The aortic valve is tricuspid. Aortic valve regurgitation is not visualized. Comparison(s): No significant change from prior study. 08/12/2020: LVEF 30-35%. FINDINGS  Left Ventricle: Left ventricular ejection fraction, by estimation, is 30 to 35%. The left ventricle has moderately decreased function. The left ventricle demonstrates global hypokinesis. The left ventricular internal cavity size was normal in size. There is mild left ventricular hypertrophy. Left ventricular diastolic parameters are consistent with Grade I diastolic dysfunction (impaired relaxation). Elevated left ventricular end-diastolic pressure. The E/e' is 15. Right Ventricle: The right ventricular size is normal. No increase in right ventricular wall thickness. Right ventricular systolic function is moderately reduced. Left Atrium: Left atrial size was mildly  dilated. Right Atrium: Right atrial size was normal in size. Pericardium: Trivial pericardial effusion is present. The pericardial effusion  is posterior to the left ventricle. Mitral Valve: The mitral valve is abnormal. There is mild thickening of the anterior and posterior mitral valve leaflet(s). Trivial mitral valve regurgitation. Tricuspid Valve: The tricuspid valve is not well visualized. Tricuspid valve regurgitation is not demonstrated. Aortic Valve: The aortic valve is tricuspid. Aortic valve regurgitation is not visualized. Aortic valve mean gradient measures 2.0 mmHg. Aortic valve peak gradient measures 4.1 mmHg. Aortic valve area, by VTI measures 2.15 cm?. Pulmonic Valve: The pulmonic valve was not well visualized. Pulmonic valve regurgitation is trivial. Aorta: The aortic root and ascending aorta are structurally normal, with no evidence of dilitation. Venous: The inferior vena cava was not well visualized. IAS/Shunts: No atrial level shunt detected by color flow Doppler.  LEFT VENTRICLE PLAX 2D LVIDd:         7.00 cm      Diastology LVIDs:         5.20 cm      LV e' medial:    3.64 cm/s LV PW:         1.20 cm      LV E/e' medial:  14.7 LV IVS:        1.30 cm      LV e' lateral:   3.30 cm/s LVOT diam:     2.00 cm      LV E/e' lateral: 16.2 LV SV:         38 LV SV Index:   18 LVOT Area:     3.14 cm?  LV Volumes (MOD) LV vol d, MOD A2C: 118.0 ml LV vol d, MOD A4C: 119.0 ml LV vol s, MOD A2C: 78.6 ml LV vol s, MOD A4C: 66.5 ml LV SV MOD A2C:     39.4 ml LV SV MOD A4C:     119.0 ml LV SV MOD BP:      47.0 ml RIGHT VENTRICLE RV Basal diam:  3.60 cm RV Mid diam:    2.00 cm RV S prime:     6.66 cm/s TAPSE (M-mode): 1.0 cm LEFT ATRIUM            Index        RIGHT ATRIUM           Index LA diam:      4.00 cm  1.86 cm/m?   RA Area:     15.80 cm? LA Vol (A2C): 77.8 ml  36.14 ml/m?  RA Volume:   46.80 ml  21.74 ml/m? LA Vol (A4C): 104.0 ml 48.32 ml/m?  AORTIC VALVE                    PULMONIC VALVE AV Area (Vmax):     2.23 cm?     PV Vmax:          0.68 m/s AV Area (Vmean):   2.47 cm?     PV Vmean:         42.800 cm/s AV Area (VTI):     2.15 cm?     PV VTI:           0.123 m AV Vmax:           101.00 cm/s  PV Peak grad:

## 2021-07-08 NOTE — Progress Notes (Signed)
Patient asked if they could have something to help them sleep. MD notified. ?

## 2021-07-08 NOTE — Progress Notes (Signed)
?PROGRESS NOTE ? ? ? ?Paula Stein  ENI:[id number] DOB: [birth year] DOA: 07/06/2021 ?PCP: [name], MD  ? ?Brief Narrative:  ?76 year old female with chronic systolic and diastolic heart failure, diabetes mellitus type 2, history of portal vein thrombosis currently on Xarelto, CKD stage IIIb (baseline creatinine from 1.3-1.6) presented with worsening shortness of breath (apparently ran out of spironolactone at home a week prior to presentation).  On presentation, BNP was 533.  Chest x-ray showed evidence of pulmonary vascular congestion and pulmonary edema with small bilateral pleural effusions.  She was started on IV Lasix. ? ?Assessment & Plan: ?  ?Acute on chronic systolic and diastolic heart failure ?-Presented with worsening shortness of breath and was found to have evidence of pulmonary congestion on chest x-ray with BNP of 533.  Apparently ran out of spironolactone for a week at home ?-Cardiology following.  ?- Strict input and output.  Daily weights.  Fluid restriction.  Negative balance of 2600 cc since admission.   ?-Continue Coreg, isosorbide mononitrate, Entresto and spironolactone.  Currently on IV Lasix as well. ?-Echo shows EF of 30 to 5% with grade 1 diastolic dysfunction ? ?CKD stage IIIb ?-Creatinine 1.3-1.6 at baseline.  Creatinine 1.53 today.  Monitor ? ?Diabetes mellitus type 2 with hyperglycemia ?-Glipizide on hold.  CBGs with SSI ? ?Hypothyroidism ?-Continue Synthroid ? ?History of portal vein thrombosis ?-Continue Xarelto ? ?Anemia of chronic disease: Possibly from kidney disease ?Macrocytosis ?-Hemoglobin stable.  Monitor intermittently. ? ? ?DVT prophylaxis: Xarelto ?Code Status: Full ?Family Communication: None at bedside ?Disposition Plan: ?Status is: Inpatient ?Remains inpatient appropriate because: Of need for IV diuresis ? ?Consultants: Cardiology ? ?Procedures: None ? ?Antimicrobials: None ? ? ?Subjective: ?Patient seen and examined at bedside.  Denies any chest pain, fever,  nausea or vomiting.  Shortness of breath is improving. Complains of constipation. ?Objective: ?Vitals:  ? 07/07/21 1733 07/07/21 2054 07/08/21 0104 07/08/21 0452  ?BP: (!) 171/84 140/77 (!) 132/58 130/63  ?Pulse: 74 74 74 72  ?Resp: '16 20 19 18  '$ ?Temp: 97.6 ?F (36.4 ?C) 97.9 ?F (36.6 ?C) 97.8 ?F (36.6 ?C) 98.6 ?F (37 ?C)  ?TempSrc: Oral Oral Oral Oral  ?SpO2: 100% 100% 94% 95%  ?Weight:      ?Height:      ? ? ?Intake/Output Summary (Last 24 hours) at 07/08/2021 0825 ?Last data filed at 07/08/2021 0100 ?Gross per 24 hour  ?Intake --  ?Output 1400 ml  ?Net -1400 ml  ? ? ?Filed Weights  ? 07/06/21 2001  ?Weight: 97.5 kg  ? ? ?Examination: ? ?General: On room air.  No distress ?ENT/neck: No thyromegaly.  JVD is not elevated  ?respiratory: Decreased breath sounds at bases bilaterally with some crackles; no wheezing  ?CVS: Currently rate controlled; S1-S2 heard  ?abdominal: Soft, nontender, slightly distended; no organomegaly, bowel sounds are heard  ?extremities: Trace lower extremity edema; no cyanosis  ?CNS: Awake and alert.  No focal neurologic deficit.  Moves extremities ?Lymph: No obvious lymphadenopathy ?Skin: No obvious ecchymosis/lesions  ?psych: Mostly flat affect.  No signs of agitation. Intermittently smiling. ?musculoskeletal: No obvious joint swelling/deformity ? ? ? ? ?Data Reviewed: I have personally reviewed following labs and imaging studies ? ?CBC: ?Recent Labs  ?Lab 07/06/21 ?1937 07/07/21 ?0357  ?WBC 8.1 7.6  ?NEUTROABS  --  4.7  ?HGB 11.7* 11.2*  ?HCT 38.8 35.9*  ?MCV 102.9* 101.1*  ?PLT 228 201  ? ? ?Basic Metabolic Panel: ?Recent Labs  ?Lab 07/06/21 ?1937 07/07/21 ?6144 07/08/21 ?0346  ?  NA 144 141 140  ?K 4.5 3.6 4.1  ?CL 113* 110 105  ?CO2 '23 25 26  '$ ?GLUCOSE 132* 122* 117*  ?BUN 22 25* 31*  ?CREATININE 1.47* 1.35* 1.53*  ?CALCIUM 8.7* 8.7* 8.6*  ?MG  --  2.1  2.1 2.3  ?PHOS  --  2.5  --   ? ? ?GFR: ?Estimated Creatinine Clearance: 38.9 mL/min (A) (by C-G formula based on SCr of 1.53 mg/dL  (H)). ?Liver Function Tests: ?Recent Labs  ?Lab 07/07/21 ?3094  ?AST 14*  ?ALT 17  ?ALKPHOS 71  ?BILITOT 0.3  ?PROT 6.8  ?ALBUMIN 2.8*  ? ? ?No results for input(s): LIPASE, AMYLASE in the last 168 hours. ?No results for input(s): AMMONIA in the last 168 hours. ?Coagulation Profile: ?No results for input(s): INR, PROTIME in the last 168 hours. ?Cardiac Enzymes: ?No results for input(s): CKTOTAL, CKMB, CKMBINDEX, TROPONINI in the last 168 hours. ?BNP (last 3 results) ?No results for input(s): PROBNP in the last 8760 hours. ?HbA1C: ?No results for input(s): HGBA1C in the last 72 hours. ?CBG: ?Recent Labs  ?Lab 07/07/21 ?0745 07/07/21 ?1227 07/07/21 ?1732 07/07/21 ?2102 07/08/21 ?0768  ?GLUCAP 110* 129* 113* 127* 215*  ? ? ?Lipid Profile: ?No results for input(s): CHOL, HDL, LDLCALC, TRIG, CHOLHDL, LDLDIRECT in the last 72 hours. ?Thyroid Function Tests: ?No results for input(s): TSH, T4TOTAL, FREET4, T3FREE, THYROIDAB in the last 72 hours. ?Anemia Panel: ?No results for input(s): VITAMINB12, FOLATE, FERRITIN, TIBC, IRON, RETICCTPCT in the last 72 hours. ?Sepsis Labs: ?No results for input(s): PROCALCITON, LATICACIDVEN in the last 168 hours. ? ?No results found for this or any previous visit (from the past 240 hour(s)).  ? ? ? ? ? ?Radiology Studies: ?DG Chest 2 View ? ?Result Date: 07/06/2021 ?CLINICAL DATA:  Chest pain.  Shortness of breath EXAM: CHEST - 2 VIEW COMPARISON:  10/18/2020 FINDINGS: Subcutaneous pacemaker unchanged in position. The heart is enlarged, increased from prior exam. There is vascular congestion and possible pulmonary edema. Fluid in the fissures with small bilateral pleural effusions. No confluent consolidation. No pneumothorax. IMPRESSION: Findings consistent with CHF. Electronically Signed   By: Keith Rake M.D.   On: 07/06/2021 20:42  ? ?ECHOCARDIOGRAM COMPLETE ? ?Result Date: 07/07/2021 ?   ECHOCARDIOGRAM REPORT   Patient Name:   Paula Stein Date of Exam: 07/07/2021 Medical Rec #:   [id number]     Height:       70.0 in Accession #:    [id number]    Weight:       215.0 lb Date of Birth:  Dec 01, 1945      BSA:          2.152 m? Patient Age:    10 years      BP:           166/136 mmHg Patient Gender: F             HR:           72 bpm. Exam Location:  Inpatient Procedure: 2D Echo, Cardiac Doppler and Color Doppler Indications:    CHF  History:        Patient has prior history of Echocardiogram examinations, most                 recent 08/12/2020. CHF and Cardiomyopathy;                 Signs/Symptoms:Shortness of Breath. AICD 10/08/14.  Sonographer:    [place] Referring Phys: [id number] TRRNHA  B [place] IMPRESSIONS  1. Left ventricular ejection fraction, by estimation, is 30 to 35%. The left ventricle has moderately decreased function. The left ventricle demonstrates global hypokinesis. There is mild left ventricular hypertrophy. Left ventricular diastolic parameters are consistent with Grade I diastolic dysfunction (impaired relaxation). Elevated left ventricular end-diastolic pressure. The E/e' is 84.  2. Right ventricular systolic function is moderately reduced. The right ventricular size is normal.  3. Left atrial size was mildly dilated.  4. The pericardial effusion is posterior to the left ventricle.  5. The mitral valve is abnormal. Trivial mitral valve regurgitation.  6. The aortic valve is tricuspid. Aortic valve regurgitation is not visualized. Comparison(s): No significant change from prior study. 08/12/2020: LVEF 30-35%. FINDINGS  Left Ventricle: Left ventricular ejection fraction, by estimation, is 30 to 35%. The left ventricle has moderately decreased function. The left ventricle demonstrates global hypokinesis. The left ventricular internal cavity size was normal in size. There is mild left ventricular hypertrophy. Left ventricular diastolic parameters are consistent with Grade I diastolic dysfunction (impaired relaxation). Elevated left ventricular end-diastolic pressure. The E/e'  is 15. Right Ventricle: The right ventricular size is normal. No increase in right ventricular wall thickness. Right ventricular systolic function is moderately reduced. Left Atrium: Left atrial size was mildly dilated. [place]

## 2021-07-09 DIAGNOSIS — I81 Portal vein thrombosis: Secondary | ICD-10-CM | POA: Diagnosis not present

## 2021-07-09 DIAGNOSIS — I5023 Acute on chronic systolic (congestive) heart failure: Secondary | ICD-10-CM | POA: Diagnosis not present

## 2021-07-09 DIAGNOSIS — E119 Type 2 diabetes mellitus without complications: Secondary | ICD-10-CM | POA: Diagnosis not present

## 2021-07-09 DIAGNOSIS — E039 Hypothyroidism, unspecified: Secondary | ICD-10-CM | POA: Diagnosis not present

## 2021-07-09 LAB — BASIC METABOLIC PANEL
Anion gap: 8 (ref 5–15)
BUN: 33 mg/dL — ABNORMAL HIGH (ref 8–23)
CO2: 29 mmol/L (ref 22–32)
Calcium: 9.1 mg/dL (ref 8.9–10.3)
Chloride: 105 mmol/L (ref 98–111)
Creatinine, Ser: 1.58 mg/dL — ABNORMAL HIGH (ref 0.44–1.00)
GFR, Estimated: 34 mL/min — ABNORMAL LOW (ref >60)
Glucose, Bld: 128 mg/dL — ABNORMAL HIGH (ref 70–99)
Potassium: 4.2 mmol/L (ref 3.5–5.1)
Sodium: 142 mmol/L (ref 135–145)

## 2021-07-09 LAB — GLUCOSE, CAPILLARY
Glucose-Capillary: 126 mg/dL — ABNORMAL HIGH (ref 70–99)
Glucose-Capillary: 215 mg/dL — ABNORMAL HIGH (ref 70–99)
Glucose-Capillary: 128 mg/dL — ABNORMAL HIGH (ref 70–99)
Glucose-Capillary: 162 mg/dL — ABNORMAL HIGH (ref 70–99)

## 2021-07-09 LAB — MAGNESIUM: Magnesium: 2.4 mg/dL (ref 1.7–2.4)

## 2021-07-09 NOTE — Progress Notes (Signed)
?PROGRESS NOTE ? ? ? ?JACKELYN ILLINGWORTH  OXB:[id number] DOB: [birth year] DOA: 07/06/2021 ?PCP: [name], MD  ? ?Brief Narrative:  ?76 year old female with chronic systolic and diastolic heart failure, diabetes mellitus type 2, history of portal vein thrombosis currently on Xarelto, CKD stage IIIb (baseline creatinine from 1.3-1.6) presented with worsening shortness of breath (apparently ran out of spironolactone at home a week prior to presentation).  On presentation, BNP was 533.  Chest x-ray showed evidence of pulmonary vascular congestion and pulmonary edema with small bilateral pleural effusions.  She was started on IV Lasix.  Cardiology was consulted. ? ?Assessment & Plan: ?  ?Acute on chronic systolic and diastolic heart failure ?-Presented with worsening shortness of breath and was found to have evidence of pulmonary congestion on chest x-ray with BNP of 533.  Apparently ran out of spironolactone for a week at home ?-Cardiology following.  ?- Strict input and output.  Daily weights.  Fluid restriction.  Negative balance of 2740 cc since admission.   ?-Continue Coreg, isosorbide mononitrate, Entresto and spironolactone.  Currently still on IV Lasix as well. ?-Echo shows EF of 30 to 35% with grade 1 diastolic dysfunction ? ?CKD stage IIIb ?-Creatinine 1.3-1.6 at baseline.  Creatinine 1.58 today.  Monitor ? ?Diabetes mellitus type 2 with hyperglycemia ?-Glipizide on hold.  CBGs with SSI ? ?Hypothyroidism ?-Continue Synthroid ? ?History of portal vein thrombosis ?-Continue Xarelto ? ?Anemia of chronic disease: Possibly from kidney disease ?Macrocytosis ?-Hemoglobin stable.  Monitor intermittently. ? ? ?DVT prophylaxis: Xarelto ?Code Status: Full ?Family Communication: None at bedside ?Disposition Plan: ?Status is: Inpatient ?Remains inpatient appropriate because: Of need for IV diuresis.  Home in 1 to 3 days once clinically improved and cleared by cardiology. ? ?Consultants: Cardiology ? ?Procedures:  None ? ?Antimicrobials: None ? ? ?Subjective: ?Patient seen and examined at bedside.  Still short of breath with exertion and does not feel ready to go home today.  No overnight fever, chest pain, vomiting reported. ?Objective: ?Vitals:  ? 07/08/21 1156 07/08/21 2056 07/09/21 0500 07/09/21 0640  ?BP: 108/65 119/74  132/87  ?Pulse: 78 74  77  ?Resp: (!) '22 19  18  '$ ?Temp: 99.1 ?F (37.3 ?C) 98.3 ?F (36.8 ?C)  97.7 ?F (36.5 ?C)  ?TempSrc: Oral Oral  Oral  ?SpO2: 94% 99%  96%  ?Weight:   102.9 kg   ?Height:      ? ? ?Intake/Output Summary (Last 24 hours) at 07/09/2021 1026 ?Last data filed at 07/09/2021 0500 ?Gross per 24 hour  ?Intake 480 ml  ?Output 1100 ml  ?Net -620 ml  ? ? ?Filed Weights  ? 07/06/21 2001 07/09/21 0500  ?Weight: 97.5 kg 102.9 kg  ? ? ?Examination: ? ?General: No acute distress.  Currently on room air.   ?ENT/neck: No JVD elevation.  No palpable thyromegaly. ?respiratory: Bilateral decreased air sounds at bases with basilar crackles, intermittent tachypnea present  ?CVS: S1-S2 heard; rate controlled currently ?abdominal: Soft, nontender, distended mildly; no organomegaly, normal bowel sounds heard ?extremities: No clubbing; mild lower extremity edema present ?CNS: Alert and oriented.  No focal neurologic deficit.  Moving extremities ?Lymph: No obvious palpable lymphadenopathy  ?skin: No obvious petechiae/rashes psych: Currently not agitated.  Intermittently smiling ? musculoskeletal: No obvious joint tenderness/swelling ? ? ? ? ?Data Reviewed: I have personally reviewed following labs and imaging studies ? ?CBC: ?Recent Labs  ?Lab 07/06/21 ?1937 07/07/21 ?0357  ?WBC 8.1 7.6  ?NEUTROABS  --  4.7  ?HGB 11.7* 11.2*  ?  HCT 38.8 35.9*  ?MCV 102.9* 101.1*  ?PLT 228 201  ? ? ?Basic Metabolic Panel: ?Recent Labs  ?Lab 07/06/21 ?1937 07/07/21 ?6803 07/08/21 ?2122 07/09/21 ?0415  ?NA 144 141 140 142  ?K 4.5 3.6 4.1 4.2  ?CL 113* 110 105 105  ?CO2 '23 25 26 29  '$ ?GLUCOSE 132* 122* 117* 128*  ?BUN 22 25* 31* 33*   ?CREATININE 1.47* 1.35* 1.53* 1.58*  ?CALCIUM 8.7* 8.7* 8.6* 9.1  ?MG  --  2.1  2.1 2.3 2.4  ?PHOS  --  2.5  --   --   ? ? ?GFR: ?Estimated Creatinine Clearance: 38.7 mL/min (A) (by C-G formula based on SCr of 1.58 mg/dL (H)). ?Liver Function Tests: ?Recent Labs  ?Lab 07/07/21 ?4825  ?AST 14*  ?ALT 17  ?ALKPHOS 71  ?BILITOT 0.3  ?PROT 6.8  ?ALBUMIN 2.8*  ? ? ?No results for input(s): LIPASE, AMYLASE in the last 168 hours. ?No results for input(s): AMMONIA in the last 168 hours. ?Coagulation Profile: ?No results for input(s): INR, PROTIME in the last 168 hours. ?Cardiac Enzymes: ?No results for input(s): CKTOTAL, CKMB, CKMBINDEX, TROPONINI in the last 168 hours. ?BNP (last 3 results) ?No results for input(s): PROBNP in the last 8760 hours. ?HbA1C: ?No results for input(s): HGBA1C in the last 72 hours. ?CBG: ?Recent Labs  ?Lab 07/08/21 ?0813 07/08/21 ?1150 07/08/21 ?1623 07/08/21 ?2105 07/09/21 ?0037  ?GLUCAP 215* 116* 156* 108* 126*  ? ? ?Lipid Profile: ?No results for input(s): CHOL, HDL, LDLCALC, TRIG, CHOLHDL, LDLDIRECT in the last 72 hours. ?Thyroid Function Tests: ?No results for input(s): TSH, T4TOTAL, FREET4, T3FREE, THYROIDAB in the last 72 hours. ?Anemia Panel: ?No results for input(s): VITAMINB12, FOLATE, FERRITIN, TIBC, IRON, RETICCTPCT in the last 72 hours. ?Sepsis Labs: ?No results for input(s): PROCALCITON, LATICACIDVEN in the last 168 hours. ? ?No results found for this or any previous visit (from the past 240 hour(s)).  ? ? ? ? ? ?Radiology Studies: ?ECHOCARDIOGRAM COMPLETE ? ?Result Date: 07/07/2021 ?   ECHOCARDIOGRAM REPORT   Patient Name:   NAILAH LUEPKE Aubuchon Date of Exam: 07/07/2021 Medical Rec #:  [id number]     Height:       70.0 in Accession #:    [id number]    Weight:       215.0 lb Date of Birth:  30-Nov-1945      BSA:          2.152 m? Patient Age:    26 years      BP:           166/136 mmHg Patient Gender: F             HR:           72 bpm. Exam Location:  Inpatient Procedure: 2D Echo, Cardiac  Doppler and Color Doppler Indications:    CHF  History:        Patient has prior history of Echocardiogram examinations, most                 recent 08/12/2020. CHF and Cardiomyopathy;                 Signs/Symptoms:Shortness of Breath. AICD 10/08/14.  Sonographer:    [place] Referring Phys: [id number] [place]  1. Left ventricular ejection fraction, by estimation, is 30 to 35%. The left ventricle has moderately decreased function. The left ventricle demonstrates global hypokinesis. There is mild left ventricular hypertrophy. Left ventricular diastolic parameters are consistent  with Grade I diastolic dysfunction (impaired relaxation). Elevated left ventricular end-diastolic pressure. The E/e' is 103.  2. Right ventricular systolic function is moderately reduced. The right ventricular size is normal.  3. Left atrial size was mildly dilated.  4. The pericardial effusion is posterior to the left ventricle.  5. The mitral valve is abnormal. Trivial mitral valve regurgitation.  6. The aortic valve is tricuspid. Aortic valve regurgitation is not visualized. Comparison(s): No significant change from prior study. 08/12/2020: LVEF 30-35%. FINDINGS  Left Ventricle: Left ventricular ejection fraction, by estimation, is 30 to 35%. The left ventricle has moderately decreased function. The left ventricle demonstrates global hypokinesis. The left ventricular internal cavity size was normal in size. There is mild left ventricular hypertrophy. Left ventricular diastolic parameters are consistent with Grade I diastolic dysfunction (impaired relaxation). Elevated left ventricular end-diastolic pressure. The E/e' is 15. Right Ventricle: The right ventricular size is normal. No increase in right ventricular wall thickness. Right ventricular systolic function is moderately reduced. Left Atrium: Left atrial size was mildly dilated. Right Atrium: Right atrial size was normal in size. Pericardium: Trivial pericardial  effusion is present. The pericardial effusion is posterior to the left ventricle. Mitral Valve: The mitral valve is abnormal. There is mild thickening of the anterior and posterior mitral valve leaflet(s). Trivial mitral valve

## 2021-07-09 NOTE — Progress Notes (Signed)
? ?Progress Note ? ?Patient Name: Paula Stein ?Date of Encounter: 07/09/2021 ? ?[facility] Cardiologist: [name], MD  ? ?Subjective  ? ?No chest pain or palpitations.  He is still unable to lie flat.  Remains on room air.  Net -2.7 L since admission, though only net -140 cc yesterday ? ?Inpatient Medications  ?  ?Scheduled Meds: ? carvedilol  25 mg Oral BID WC  ? furosemide  40 mg Intravenous BID  ? insulin aspart  0-9 Units Subcutaneous TID WC  ? isosorbide mononitrate  30 mg Oral Daily  ? levothyroxine  125 mcg Oral Q0600  ? melatonin  3 mg Oral Once  ? rivaroxaban  20 mg Oral Q supper  ? sacubitril-valsartan  1 tablet Oral BID  ? senna-docusate  1 tablet Oral BID  ? spironolactone  25 mg Oral Daily  ? ?Continuous Infusions: ? ?PRN Meds: ?acetaminophen **OR** acetaminophen, bisacodyl, polyethylene glycol, traZODone  ? ?Vital Signs  ?  ?Vitals:  ? 07/08/21 1156 07/08/21 2056 07/09/21 0500 07/09/21 0640  ?BP: 108/65 119/74  132/87  ?Pulse: 78 74  77  ?Resp: (!) '22 19  18  '$ ?Temp: 99.1 ?F (37.3 ?C) 98.3 ?F (36.8 ?C)  97.7 ?F (36.5 ?C)  ?TempSrc: Oral Oral  Oral  ?SpO2: 94% 99%  96%  ?Weight:   102.9 kg   ?Height:      ? ? ?Intake/Output Summary (Last 24 hours) at 07/09/2021 0915 ?Last data filed at 07/09/2021 0500 ?Gross per 24 hour  ?Intake 480 ml  ?Output 1100 ml  ?Net -620 ml  ? ? ? ?  07/09/2021  ?  5:00 AM 07/06/2021  ?  8:01 PM 06/08/2021  ?  1:23 PM  ?Last 3 Weights  ?Weight (lbs) 226 lb 13.7 oz 215 lb 230 lb  ?Weight (kg) 102.9 kg 97.523 kg 104.327 kg  ?   ? ?Telemetry  ?  ?Sinus rhythm with PVCs-personally reviewed ? ?ECG  ?  ?None new ? ?Physical Exam  ? ?GEN: Well nourished, well developed, in no acute distress  ?HEENT: normal  ?Neck: + JVD, carotid bruits, or masses ?Cardiac: RRR; no murmurs, rubs, or gallops,no edema  ?Respiratory:  clear to auscultation bilaterally, normal work of breathing ?GI: soft, nontender, nondistended, + BS ?MS: no deformity or atrophy  ?Skin: warm and dry,  ?Neuro:  Strength  and sensation are intact ?Psych: euthymic mood, full affect ? ?Labs  ?  ?High Sensitivity Troponin:   ?Recent Labs  ?Lab 07/06/21 ?1937 07/06/21 ?2152  ?TROPONINIHS 28* 25*  ? ?   ?Chemistry ?Recent Labs  ?Lab 07/07/21 ?1660 07/08/21 ?0346 07/09/21 ?6301  ?NA 141 140 142  ?K 3.6 4.1 4.2  ?CL 110 105 105  ?CO2 '25 26 29  '$ ?GLUCOSE 122* 117* 128*  ?BUN 25* 31* 33*  ?CREATININE 1.35* 1.53* 1.58*  ?CALCIUM 8.7* 8.6* 9.1  ?MG 2.1  2.1 2.3 2.4  ?PROT 6.8  --   --   ?ALBUMIN 2.8*  --   --   ?AST 14*  --   --   ?ALT 17  --   --   ?ALKPHOS 71  --   --   ?BILITOT 0.3  --   --   ?GFRNONAA 40* 35* 34*  ?ANIONGAP '6 9 8  '$ ? ?  ?Lipids No results for input(s): CHOL, TRIG, HDL, LABVLDL, LDLCALC, CHOLHDL in the last 168 hours.  ?Hematology ?Recent Labs  ?Lab 07/06/21 ?1937 07/07/21 ?0357  ?WBC 8.1 7.6  ?RBC 3.77*  3.55*  ?HGB 11.7* 11.2*  ?HCT 38.8 35.9*  ?MCV 102.9* 101.1*  ?MCH 31.0 31.5  ?MCHC 30.2 31.2  ?RDW 14.3 14.1  ?PLT 228 201  ? ? ?Thyroid No results for input(s): TSH, FREET4 in the last 168 hours.  ?BNP ?Recent Labs  ?Lab 07/06/21 ?1938  ?BNP 533.5*  ? ?  ?DDimer No results for input(s): DDIMER in the last 168 hours.  ? ?Radiology  ?  ?ECHOCARDIOGRAM COMPLETE ? ?Result Date: 07/07/2021 ?   ECHOCARDIOGRAM REPORT   Patient Name:   AMAIRANI SHUEY Zaun Date of Exam: 07/07/2021 Medical Rec #:  [id number]     Height:       70.0 in Accession #:    [id number]    Weight:       215.0 lb Date of Birth:  [birth year]      BSA:          2.152 m? Patient Age:    76 years      BP:           166/136 mmHg Patient Gender: F             HR:           72 bpm. Exam Location:  Inpatient Procedure: 2D Echo, Cardiac Doppler and Color Doppler Indications:    CHF  History:        Patient has prior history of Echocardiogram examinations, most                 recent 08/12/2020. CHF and Cardiomyopathy;                 Signs/Symptoms:Shortness of Breath. AICD 10/08/14.  Sonographer:    [place] Referring Phys: [id number] [place]  1. Left  ventricular ejection fraction, by estimation, is 30 to 35%. The left ventricle has moderately decreased function. The left ventricle demonstrates global hypokinesis. There is mild left ventricular hypertrophy. Left ventricular diastolic parameters are consistent with Grade I diastolic dysfunction (impaired relaxation). Elevated left ventricular end-diastolic pressure. The E/e' is 100.  2. Right ventricular systolic function is moderately reduced. The right ventricular size is normal.  3. Left atrial size was mildly dilated.  4. The pericardial effusion is posterior to the left ventricle.  5. The mitral valve is abnormal. Trivial mitral valve regurgitation.  6. The aortic valve is tricuspid. Aortic valve regurgitation is not visualized. Comparison(s): No significant change from prior study. 08/12/2020: LVEF 30-35%. FINDINGS  Left Ventricle: Left ventricular ejection fraction, by estimation, is 30 to 35%. The left ventricle has moderately decreased function. The left ventricle demonstrates global hypokinesis. The left ventricular internal cavity size was normal in size. There is mild left ventricular hypertrophy. Left ventricular diastolic parameters are consistent with Grade I diastolic dysfunction (impaired relaxation). Elevated left ventricular end-diastolic pressure. The E/e' is 15. Right Ventricle: The right ventricular size is normal. No increase in right ventricular wall thickness. Right ventricular systolic function is moderately reduced. Left Atrium: Left atrial size was mildly dilated. Right Atrium: Right atrial size was normal in size. Pericardium: Trivial pericardial effusion is present. The pericardial effusion is posterior to the left ventricle. Mitral Valve: The mitral valve is abnormal. There is mild thickening of the anterior and posterior mitral valve leaflet(s). Trivial mitral valve regurgitation. Tricuspid Valve: The tricuspid valve is not well visualized. Tricuspid valve regurgitation is not  demonstrated. Aortic Valve: The aortic valve is tricuspid. Aortic valve regurgitation is not visualized. Aortic valve mean gradient measures  2.0 mmHg. Aortic valve peak gradient measures 4.1 mmHg. Aortic valve area, by VTI measures 2.15 cm?. Pulmonic Valve: The pulmonic valve was not well visualized. Pulmonic valve regurgitation is trivial. Aorta: The aortic root and ascending aorta are structurally normal, with no evidence of dilitation. Venous: The inferior vena cava was not well visualized. IAS/Shunts: No atrial level shunt detected by color flow Doppler.  LEFT VENTRICLE PLAX 2D LVIDd:         7.00 cm      Diastology LVIDs:         5.20 cm      LV e' medial:    3.64 cm/s LV PW:         1.20 cm      LV E/e' medial:  14.7 LV IVS:        1.30 cm      LV e' lateral:   3.30 cm/s LVOT diam:     2.00 cm      LV E/e' lateral: 16.2 LV SV:         38 LV SV Index:   18 LVOT Area:     3.14 cm?  LV Volumes (MOD) LV vol d, MOD A2C: 118.0 ml LV vol d, MOD A4C: 119.0 ml LV vol s, MOD A2C: 78.6 ml LV vol s, MOD A4C: 66.5 ml LV SV MOD A2C:     39.4 ml LV SV MOD A4C:     119.0 ml LV SV MOD BP:      47.0 ml RIGHT VENTRICLE RV Basal diam:  3.60 cm RV Mid diam:    2.00 cm RV S prime:     6.66 cm/s TAPSE (M-mode): 1.0 cm LEFT ATRIUM            Index        RIGHT ATRIUM           Index LA diam:      4.00 cm  1.86 cm/m?   RA Area:     15.80 cm? LA Vol (A2C): 77.8 ml  36.14 ml/m?  RA Volume:   46.80 ml  21.74 ml/m? LA Vol (A4C): 104.0 ml 48.32 ml/m?  AORTIC VALVE                    PULMONIC VALVE AV Area (Vmax):    2.23 cm?     PV Vmax:          0.68 m/s AV Area (Vmean):   2.47 cm?     PV Vmean:         42.800 cm/s AV Area (VTI):     2.15 cm?     PV VTI:           0.123 m AV Vmax:           101.00 cm/s  PV Peak grad:     1.8 mmHg AV Vmean:          68.300 cm/s  PV Mean grad:     1.0 mmHg AV VTI:            0.178 m      PR End Diast Vel: 4.41 msec AV Peak Grad:      4.1 mmHg AV Mean Grad:      2.0 mmHg LVOT Vmax:         71.80 cm/s LVOT  Vmean:        53.600 cm/s LVOT VTI:          0.122 m  LVOT/AV VTI ratio: 0.69  AORTA Ao Root diam: 3.00 cm Ao Asc diam:  3.10 cm MITRAL VALVE MV Area (PHT): 3.65 cm?    SHUNTS MV Decel Time: 208 msec    Systemic VTI:  0

## 2021-07-10 DIAGNOSIS — I5023 Acute on chronic systolic (congestive) heart failure: Secondary | ICD-10-CM | POA: Diagnosis not present

## 2021-07-10 DIAGNOSIS — E119 Type 2 diabetes mellitus without complications: Secondary | ICD-10-CM | POA: Diagnosis not present

## 2021-07-10 DIAGNOSIS — I81 Portal vein thrombosis: Secondary | ICD-10-CM | POA: Diagnosis not present

## 2021-07-10 DIAGNOSIS — E039 Hypothyroidism, unspecified: Secondary | ICD-10-CM | POA: Diagnosis not present

## 2021-07-10 LAB — GLUCOSE, CAPILLARY
Glucose-Capillary: 119 mg/dL — ABNORMAL HIGH (ref 70–99)
Glucose-Capillary: 126 mg/dL — ABNORMAL HIGH (ref 70–99)

## 2021-07-10 LAB — BASIC METABOLIC PANEL
Anion gap: 9 (ref 5–15)
BUN: 38 mg/dL — ABNORMAL HIGH (ref 8–23)
CO2: 29 mmol/L (ref 22–32)
Calcium: 9.1 mg/dL (ref 8.9–10.3)
Chloride: 101 mmol/L (ref 98–111)
Creatinine, Ser: 1.86 mg/dL — ABNORMAL HIGH (ref 0.44–1.00)
GFR, Estimated: 28 mL/min — ABNORMAL LOW (ref >60)
Glucose, Bld: 127 mg/dL — ABNORMAL HIGH (ref 70–99)
Potassium: 4.3 mmol/L (ref 3.5–5.1)
Sodium: 139 mmol/L (ref 135–145)

## 2021-07-10 MED ORDER — POLYETHYLENE GLYCOL 3350 17 G PO PACK: 17.0000 g | PACK | Freq: Every day | ORAL | 0 refills | Status: DC | PRN

## 2021-07-10 MED ORDER — SPIRONOLACTONE 50 MG PO TABS: 50.0000 mg | ORAL_TABLET | Freq: Every day | ORAL | 0 refills | Status: DC

## 2021-07-10 MED ORDER — SACUBITRIL-VALSARTAN 97-103 MG PO TABS: 1.0000 | ORAL_TABLET | Freq: Two times a day (BID) | ORAL | 0 refills | Status: DC

## 2021-07-10 NOTE — Discharge Summary (Signed)
Physician Discharge Summary  ?Paula Stein:[id number] DOB: 04-Mar-1946 DOA: 07/06/2021 ? ?PCP: [name], MD ? ?Admit date: 07/06/2021 ?Discharge date: 07/10/2021 ? ?Admitted From: Home ?Disposition: Home ? ?Recommendations for Outpatient Follow-up:  ?Follow up with PCP in 1 week with repeat BMP ?Outpatient follow-up with cardiology ?Follow up in ED if symptoms worsen or new appear ? ? ?Home Health: No ?Equipment/Devices: None ? ?Discharge Condition: Stable ?CODE STATUS: Full ?Diet recommendation: Heart healthy/carb modified/fluid restriction of up to 1200 cc a day ? ?Brief/Interim Summary: ?76 year old female with chronic systolic and diastolic heart failure, diabetes mellitus type 2, history of portal vein thrombosis currently on Xarelto, CKD stage IIIb (baseline creatinine from 1.3-1.6) presented with worsening shortness of breath (apparently ran out of spironolactone at home a week prior to presentation).  On presentation, BNP was 533.  Chest x-ray showed evidence of pulmonary vascular congestion and pulmonary edema with small bilateral pleural effusions.  She was started on IV Lasix.  Cardiology was consulted.  During the hospitalization, her condition has gradually improved.  Cardiology has cleared the patient for discharge home today with close outpatient follow-up with cardiology.  Discharge patient home today. ? ?Discharge Diagnoses:  ? ?Acute on chronic systolic and diastolic heart failure status post history of ICD placement ?-Presented with worsening shortness of breath and was found to have evidence of pulmonary congestion on chest x-ray with BNP of 533.  Apparently ran out of spironolactone for a week at home ?-Cardiology following.  ?-Continue fluid and diet restriction on discharge.  Negative balance of 2070 cc since admission.   ?-Currently on Coreg, isosorbide mononitrate, and spironolactone.  Entresto dose has been increased by cardiology during this hospitalization.  Currently still on IV Lasix  as well. ?-Echo shows EF of 30 to 35% with grade 1 diastolic dysfunction ?-Cardiology has cleared the patient for discharge home today with close outpatient follow-up with cardiology.  Cardiology recommends that patient continue her prior heart failure medications including the spironolactone.  Continue increased dose of Entresto as well. ?  ?CKD stage IIIb ?-Creatinine 1.3-1.6 at baseline.  Creatinine 1.86 today.  Cardiology aware of this and recommend to hold IV Lasix and switch to oral Lasix on discharge with repeat BMP within a week and outpatient follow-up with cardiology. ?  ?Diabetes mellitus type 2 with hyperglycemia ?-Carb modified diet.  Resume glipizide  ? ?hypothyroidism ?-Continue Synthroid ?  ?History of portal vein thrombosis ?-Continue Xarelto ? ?Anemia of chronic disease: Possibly from kidney disease ?Macrocytosis ?-Hemoglobin stable.  Outpatient follow-up. ? ?Discharge Instructions ? ?Discharge Instructions   ? ? Ambulatory referral to Cardiology   Complete by: As directed ?  ? Diet - low sodium heart healthy   Complete by: As directed ?  ? Diet Carb Modified   Complete by: As directed ?  ? Increase activity slowly   Complete by: As directed ?  ? ?  ? ?Allergies as of 07/10/2021   ? ?   Reactions  ? Ibuprofen Nausea Only, Other (See Comments)  ? ?  ? ?  ?Medication List  ?  ? ?STOP taking these medications   ? ?Entresto 49-51 MG ?Generic drug: sacubitril-valsartan ?Replaced by: sacubitril-valsartan 97-103 MG ?  ? ?  ? ?TAKE these medications   ? ?Accu-Chek Guide test strip ?Generic drug: glucose blood ?USE TO CHECK BLOOD SUGAR DAILY ?  ?Accu-Chek Guide w/Device Kit ?1 each by Does not apply route daily. Use to check blood sugar daily ?  ?Accu-Chek Softclix Lancets  lancets ?USE TO CHECK BLOOD SUGAR DAILY ?  ?acetaminophen 500 MG tablet ?Commonly known as: TYLENOL ?Take 1 tablet (500 mg total) by mouth every 6 (six) hours as needed. ?What changed: reasons to take this ?  ?allopurinol 100 MG  tablet ?Commonly known as: ZYLOPRIM ?Take 1 tablet (100 mg total) by mouth daily. ?What changed:  ?when to take this ?reasons to take this ?  ?carvedilol 25 MG tablet ?Commonly known as: COREG ?Take 1 tablet (25 mg total) by mouth 2 (two) times daily with a meal. Pt needs to keep upcoming appt in Dec for further refills ?  ?dicyclomine 10 MG capsule ?Commonly known as: BENTYL ?Take 10 mg by mouth every 6 (six) hours as needed for cramping. ?  ?furosemide 40 MG tablet ?Commonly known as: LASIX ?Take 1 tablet (40 mg total) by mouth 2 (two) times daily. ?  ?gabapentin 100 MG capsule ?Commonly known as: NEURONTIN ?Take 100 mg by mouth daily as needed for pain. ?  ?glipiZIDE 2.5 MG 24 hr tablet ?Commonly known as: GLUCOTROL XL ?TAKE 1 TABLET DAILY WITH BREAKFAST. ?What changed: how to take this ?  ?isosorbide mononitrate 30 MG 24 hr tablet ?Commonly known as: IMDUR ?TAKE 1 TABLET(30 MG) BY MOUTH DAILY ?What changed: See the new instructions. ?  ?levothyroxine 125 MCG tablet ?Commonly known as: SYNTHROID ?TAKE 1 TABLET EVERY MORNING ON AN EMPTY STOMACH 30 MINUTES BEFORE TAKING OTHER MEDICATIONS OR BEFORE BREAKFAST ?  ?polyethylene glycol 17 g packet ?Commonly known as: MIRALAX / GLYCOLAX ?Take 17 g by mouth daily as needed for moderate constipation. ?  ?rivaroxaban 20 MG Tabs tablet ?Commonly known as: XARELTO ?Take 1 tablet (20 mg total) by mouth daily with supper. ?  ?sacubitril-valsartan 97-103 MG ?Commonly known as: ENTRESTO ?Take 1 tablet by mouth 2 (two) times daily. ?Replaces: Entresto 49-51 MG ?  ?spironolactone 50 MG tablet ?Commonly known as: ALDACTONE ?Take 1 tablet (50 mg total) by mouth daily. ?  ? ?  ? ? Follow-up Information   ? ? [name], MD Follow up.   ?Specialty: Cardiology ?Why: cardiology scheduler will contact you to arrange a follow up, please give [place] a call if you do not from hear from our scheduler in 3 business days. ?Contact information: ?[address] ?[address] ?[place]  [address] ?[phone] ? ? ?  ?  ? ? [name], MD. Schedule an appointment as soon as possible for a visit in 1 week(s).   ?Specialty: Family Medicine ?Why: with bmp ?Contact information: ?[address] ?[place] [address] ?[phone] ? ? ?  ?  ? ? [name], MD .   ?Specialty: Cardiology ?Contact information: ?[address] ?[address] ?[place] [address] ?[phone] ? ? ?  ?  ? ? [name], MD .   ?Specialty: Cardiology ?Contact information: ?[address] ?[address] ?[place] [address] ?[phone] ? ? ?  ?  ? ?  ?  ? ?  ? ?Allergies  ?Allergen Reactions  ? Ibuprofen Nausea Only and Other (See Comments)  ? ? ?Consultations: ?Cardiology ? ? ?Procedures/Studies: ?DG Chest 2 View ? ?Result Date: 07/06/2021 ?CLINICAL DATA:  Chest pain.  Shortness of breath EXAM: CHEST - 2 VIEW COMPARISON:  10/18/2020 FINDINGS: Subcutaneous pacemaker unchanged in position. The heart is enlarged, increased from prior exam. There is vascular congestion and possible pulmonary edema. Fluid in the fissures with small bilateral pleural effusions. No confluent consolidation. No pneumothorax. IMPRESSION: Findings consistent with  CHF. Electronically Signed   By: Keith Rake M.D.   On: 07/06/2021 20:42  ? ?ECHOCARDIOGRAM COMPLETE ? ?Result Date: 07/07/2021 ?   ECHOCARDIOGRAM REPORT   Patient Name:   Paula Stein Date of Exam: 07/07/2021 Medical Rec #:  [id number]     Height:       70.0 in Accession #:    [id number]    Weight:       215.0 lb Date of Birth:  1946/01/12      BSA:          2.152 m? Patient Age:    41 years      BP:           166/136 mmHg Patient Gender: F             HR:           72 bpm. Exam Location:  Inpatient Procedure: 2D Echo, Cardiac Doppler and Color Doppler Indications:    CHF  History:        Patient has prior history of Echocardiogram examinations, most                 recent 08/12/2020. CHF and Cardiomyopathy;                 Signs/Symptoms:Shortness of Breath. AICD 10/08/14.   Sonographer:    [place] Referring Phys: [id number] [place]  1. Left ventricular ejection fraction, by estimation, is 30 to 35%. The left ventricle has moderately decreased function

## 2021-07-10 NOTE — Progress Notes (Signed)
? ?Progress Note ? ?Patient Name: Paula Stein ?Date of Encounter: 07/10/2021 ? ?[facility] Cardiologist: [name], MD  ? ?Subjective  ? ?No chest pain or palpitations.  Net -2.3 L since admission.  Currently able to lie flat without issue.  Patient is ready to go home. ? ?Inpatient Medications  ?  ?Scheduled Meds: ? carvedilol  25 mg Oral BID WC  ? insulin aspart  0-9 Units Subcutaneous TID WC  ? isosorbide mononitrate  30 mg Oral Daily  ? levothyroxine  125 mcg Oral Q0600  ? melatonin  3 mg Oral Once  ? rivaroxaban  20 mg Oral Q supper  ? sacubitril-valsartan  1 tablet Oral BID  ? senna-docusate  1 tablet Oral BID  ? ?Continuous Infusions: ? ?PRN Meds: ?acetaminophen **OR** acetaminophen, bisacodyl, polyethylene glycol, traZODone  ? ?Vital Signs  ?  ?Vitals:  ? 07/09/21 1426 07/09/21 1735 07/10/21 0444 07/10/21 0503  ?BP: (!) 96/56 140/67  119/77  ?Pulse:  74  75  ?Resp:    20  ?Temp:    97.7 ?F (36.5 ?C)  ?TempSrc:    Oral  ?SpO2:    98%  ?Weight:   102.9 kg   ?Height:      ? ? ?Intake/Output Summary (Last 24 hours) at 07/10/2021 0824 ?Last data filed at 07/10/2021 0452 ?Gross per 24 hour  ?Intake 1090 ml  ?Output 660 ml  ?Net 430 ml  ? ? ? ?  07/10/2021  ?  4:44 AM 07/09/2021  ?  5:00 AM 07/06/2021  ?  8:01 PM  ?Last 3 Weights  ?Weight (lbs) 226 lb 13.7 oz 226 lb 13.7 oz 215 lb  ?Weight (kg) 102.9 kg 102.9 kg 97.523 kg  ?   ? ?Telemetry  ?  ?Sinus rhythm-personally reviewed ? ?ECG  ?  ?None new ? ?Physical Exam  ? ?GEN: Well nourished, well developed, in no acute distress  ?HEENT: normal  ?Neck: no JVD, carotid bruits, or masses ?Cardiac: RRR; no murmurs, rubs, or gallops,no edema  ?Respiratory:  clear to auscultation bilaterally, normal work of breathing ?GI: soft, nontender, nondistended, + BS ?MS: no deformity or atrophy  ?Skin: warm and dry ?Neuro:  Strength and sensation are intact ?Psych: euthymic mood, full affect ? ? ?Labs  ?  ?High Sensitivity Troponin:   ?Recent Labs  ?Lab 07/06/21 ?1937 07/06/21 ?2152   ?TROPONINIHS 28* 25*  ? ?   ?Chemistry ?Recent Labs  ?Lab 07/07/21 ?9924 07/08/21 ?0346 07/09/21 ?0415 07/10/21 ?2683  ?NA 141 140 142 139  ?K 3.6 4.1 4.2 4.3  ?CL 110 105 105 101  ?CO2 '25 26 29 29  '$ ?GLUCOSE 122* 117* 128* 127*  ?BUN 25* 31* 33* 38*  ?CREATININE 1.35* 1.53* 1.58* 1.86*  ?CALCIUM 8.7* 8.6* 9.1 9.1  ?MG 2.1  2.1 2.3 2.4  --   ?PROT 6.8  --   --   --   ?ALBUMIN 2.8*  --   --   --   ?AST 14*  --   --   --   ?ALT 17  --   --   --   ?ALKPHOS 71  --   --   --   ?BILITOT 0.3  --   --   --   ?GFRNONAA 40* 35* 34* 28*  ?ANIONGAP '6 9 8 9  '$ ? ?  ?Lipids No results for input(s): CHOL, TRIG, HDL, LABVLDL, LDLCALC, CHOLHDL in the last 168 hours.  ?Hematology ?Recent Labs  ?Lab 07/06/21 ?1937 07/07/21 ?0357  ?  WBC 8.1 7.6  ?RBC 3.77* 3.55*  ?HGB 11.7* 11.2*  ?HCT 38.8 35.9*  ?MCV 102.9* 101.1*  ?MCH 31.0 31.5  ?MCHC 30.2 31.2  ?RDW 14.3 14.1  ?PLT 228 201  ? ? ?Thyroid No results for input(s): TSH, FREET4 in the last 168 hours.  ?BNP ?Recent Labs  ?Lab 07/06/21 ?1938  ?BNP 533.5*  ? ?  ?DDimer No results for input(s): DDIMER in the last 168 hours.  ? ?Radiology  ?  ?No results found. ? ?Cardiac Studies  ? ?Echocardiogram 07/07/21  ? 1. Left ventricular ejection fraction, by estimation, is 30 to 35%. The  ?left ventricle has moderately decreased function. The left ventricle  ?demonstrates global hypokinesis. There is mild left ventricular  ?hypertrophy. Left ventricular diastolic  ?parameters are consistent with Grade I diastolic dysfunction (impaired  ?relaxation). Elevated left ventricular end-diastolic pressure. The E/e' is  ?15.  ? 2. Right ventricular systolic function is moderately reduced. The right  ?ventricular size is normal.  ? 3. Left atrial size was mildly dilated.  ? 4. The pericardial effusion is posterior to the left ventricle.  ? 5. The mitral valve is abnormal. Trivial mitral valve regurgitation.  ? 6. The aortic valve is tricuspid. Aortic valve regurgitation is not  ?visualized.  ? ?Comparison(s):  No significant change from prior study. 08/12/2020: LVEF  ?30-35%.  ? ?Patient Profile  ?   ?76 y.o. 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 Starla Link. ? ?Assessment & Plan  ?  ?Acute on Chronic Combined CHF ?Non-Ischemic Cardiomyopathy ?History of nonischemic cardiomyopathy.  She went into heart failure as she was out of her Aldactone.  She is able to lie flat this morning.  She is net -2.3 L since admission.  At this point, would be okay for discharge home.  We Tiffny Gemmer continue all of her heart failure medications.  She states that she Laylamarie Meuser weigh herself on a daily basis.   ? ?Chest Pain ?Complaining of nonradiating chest pain.  Troponins were flat and only minimally elevated.  Potentially due to heart failure.  No ischemic evaluation at this time.   ? ?  ?Non-Sustained VT ?Continue carvedilol.  We Ameli Sangiovanni replete potassium magnesium as needed with diuresis.  Potentially caused by volume overload.  No other changes.   ?  ?S/p ICD ?Primary prevention ICD ?  ?Hypertension ?Currently well controlled. ?  ?CKD Stage III ?Stopping IV diuresis and planning for p.o. diuresis at discharge. ? ?History of Portal Vein Thrombosis ?Continue Xarelto. ?  ?Otherwise, per primary team: ?- Type 2 diabetes  ?- Hypothyroidism  ?   ? ?For questions or updates, please contact [place] ?Please consult [email or web address] for contact info under  ? ?[facility] Raylan Troiani sign off.   ?Medication Recommendations: Continue home heart failure medications ?Other recommendations (labs, testing, etc): None ?Follow up as an outpatient: To be arranged in cardiology clinic ? ? ?

## 2021-07-11 ENCOUNTER — Telehealth: Payer: Self-pay | Admitting: Pharmacist

## 2021-07-11 ENCOUNTER — Telehealth: Payer: Self-pay

## 2021-07-11 NOTE — Telephone Encounter (Addendum)
Transition Care Management Unsuccessful Follow-up Telephone Call ? ?Date of discharge and from where: [place] 07-10-21 Dx: acute on chronic CHF  ? ?Attempts:  1st Attempt ? ?Reason for unsuccessful TCM follow-up call:  Left voice message ? ?Transition Care Management Unsuccessful Follow-up Telephone Call ? ?Date of discharge and from where:  [place] 07-10-21 Dx: acute on chronic CHF ? ?Attempts:  2nd Attempt ? ?Reason for unsuccessful TCM follow-up call:  Left voice message ? ?Transition Care Management Unsuccessful Follow-up Telephone Call ? ?Date of discharge and from where:  [place] 07-10-21 Dx: acute on chronic CHF ? ?Attempts:  3rd Attempt ? ?Reason for unsuccessful TCM follow-up call:  Left voice message ? ?  ? ?  ? ?  ?

## 2021-07-11 NOTE — Chronic Care Management (AMB) (Signed)
? ? ?Chronic Care Management ?Pharmacy Assistant  ? ?Name: Paula Stein  MRN: [id number] DOB: Aug 02, 1945 ? ?Reason for Encounter: Chart Prep for initial encounter with [name] Clinical Pharmacist on 07/15/21 at 9 am via phone call. ?  ?Conditions to be addressed/monitored: ?CHF, CAD, HTN, HLD, DMII, CKD Stage 3, GERD, and Tobacco Use ? ?Primary concerns for visit include: ?01/14/21  Grier Mitts R - Patient presented for Type 2 diabetes mellitus with diabetic neuropathy without long term current use of insulin and other concerns. No medication changes. ? ?Recent office visits:  ?01/28/21 - Patient presented for removal of sutures. No medication changes. ? ?Recent consult visits:  ?06/08/21 [name], MD (Cardiology) - Patient presented for Hypertrophic Cardiomyopathy. Stopped Atorvastatin, Stopped Dicyclomine, Stopped Gabapentin, Stopped Hydroxychloroquine, Stopped Omeprazole. Increased Entresto to 49/51 twice daily ? ?06/06/21 Sabino Niemann ([place]) - Patient presented for Inflammatory arthritis and other concerns. No medication changes. ? ?04/29/21 [name] (Cardiology) - Patient presented for Other Cardiomyopathies. No other visit details available. ? ?01/28/21 [name] (Cardiology) - Patient presented for [facility]. No other visit details available. ? ?01/13/21 Magrinat, Virgie Dad, MD (Oncology) - Patient presented for Portal vein thrombosis and other concerns. No medication changes. ? ?Hospital visits:  ?Medication Reconciliation was completed by comparing discharge summary, patient?s EMR and Pharmacy list, and upon discussion with patient. ? ?Patient presented to [facility] on 07/06/21 due to Acute on chronic systolic Congestive Heart Failure. Patient was present for 4 days. ? ?New?Medications Started at [facility] Discharge:?? ?-started  ?polyethylene glycol (MIRALAX / GLYCOLAX) ?sacubitril-valsartan (ENTRESTO ? ?Medication Changes at Hospital Discharge: ?-Changed   ?None ? ?Medications Discontinued at Hospital Discharge: ?-Stopped  ?Entresto 49-51 MG (sacubitril-valsartan) ? ?Medications that remain the same after Hospital Discharge:??  ?-All other medications will remain the same.   ? ? ?Patient presented to [facility] on 01/17/21 due to finger laceration. Patient was present for 2 hours. ? ?New?Medications Started at [facility] Discharge:?? ?-started  ?none ? ?Medication Changes at Hospital Discharge: ?-Changed  ?None ? ?Medications Discontinued at Hospital Discharge: ?-Stopped  ?none ? ?Medications that remain the same after Hospital Discharge:??  ?-All other medications will remain the same.   ? ?Medications: ?Outpatient Encounter Medications as of 07/11/2021  ?Medication Sig  ? ACCU-CHEK GUIDE test strip USE TO CHECK BLOOD SUGAR DAILY  ? Accu-Chek Softclix Lancets lancets USE TO CHECK BLOOD SUGAR DAILY  ? acetaminophen (TYLENOL) 500 MG tablet Take 1 tablet (500 mg total) by mouth every 6 (six) hours as needed. (Patient taking differently: Take 500 mg by mouth every 6 (six) hours as needed for mild pain.)  ? allopurinol (ZYLOPRIM) 100 MG tablet Take 1 tablet (100 mg total) by mouth daily. (Patient taking differently: Take 100 mg by mouth daily as needed (gout flare up).)  ? Blood Glucose Monitoring Suppl (ACCU-CHEK GUIDE) w/Device KIT 1 each by Does not apply route daily. Use to check blood sugar daily  ? carvedilol (COREG) 25 MG tablet Take 1 tablet (25 mg total) by mouth 2 (two) times daily with a meal. Pt needs to keep upcoming appt in Dec for further refills  ? dicyclomine (BENTYL) 10 MG capsule Take 10 mg by mouth every 6 (six) hours as needed for cramping.  ? furosemide (LASIX) 40 MG tablet Take 1 tablet (40 mg total) by mouth 2 (two) times daily.  ? gabapentin (NEURONTIN) 100 MG capsule Take 100 mg by mouth daily as needed for pain.  ?  glipiZIDE (GLUCOTROL XL) 2.5 MG 24 hr tablet TAKE 1 TABLET DAILY WITH BREAKFAST. (Patient taking differently: 2.5 mg daily  with breakfast.)  ? isosorbide mononitrate (IMDUR) 30 MG 24 hr tablet TAKE 1 TABLET(30 MG) BY MOUTH DAILY (Patient taking differently: 30 mg daily.)  ? levothyroxine (SYNTHROID) 125 MCG tablet TAKE 1 TABLET EVERY MORNING ON AN EMPTY STOMACH 30 MINUTES BEFORE TAKING OTHER MEDICATIONS OR BEFORE BREAKFAST  ? polyethylene glycol (MIRALAX / GLYCOLAX) 17 g packet Take 17 g by mouth daily as needed for moderate constipation.  ? rivaroxaban (XARELTO) 20 MG TABS tablet Take 1 tablet (20 mg total) by mouth daily with supper.  ? sacubitril-valsartan (ENTRESTO) 97-103 MG Take 1 tablet by mouth 2 (two) times daily.  ? spironolactone (ALDACTONE) 50 MG tablet Take 1 tablet (50 mg total) by mouth daily.  ? ?No facility-administered encounter medications on file as of 07/11/2021.  ?Fill History Gaps : ?Accu-Chek Guide test strips 03/18/2021 90  ? ?allopurinol 100 mg tablet 03/18/2021 90  ? ?ACCU-CHEK GUIDE CARE KIT 11/26/2019 30  ? ?CARVEDILOL 25MG TABLETS 05/13/2021 90  ? ?DICYCLOMINE 10MG CAPSULES 06/23/2021 14  ? ?furosemide 40 mg tablet 01/23/2020 90  ? ?GABAPENTIN 100MG CAPSULES 07/05/2021 30  ? ?glipizide ER 2.5 mg tablet, extended release 24 hr 04/03/2021 90  ? ?ISOSORBIDE MONONITRATE 30MG ER TABS 05/14/2019 90  ? ?levothyroxine 125 mcg tablet 03/18/2021 90  ? ?XARELTO 20MG TABLETS 07/10/2021 30  ? ?ENTRESTO 97-103MG TABLETS 07/10/2021 30  ? ?SPIRONOLACTONE 50MG TABLETS 07/10/2021 30  ? ? ?Unable to reach for initial questions, left voicemail on 07/14/21 to remind of appointment  ? ? ? ?Care Gaps: ?BP- 164/80 ( 06/08/21) ?Zoster Vaccine - Overdue ?COVID Booster - Overdue ?Foot Exam - Overdue ?Eye Exam - Overdue ?AWV- 7/22 ? ?Star Rating Drugs: ?Glipizide 2.5 mg - Last filled 90 DS at [facility] ?Lab Results  ?Component Value Date  ? HGBA1C 6.0 (A) 01/14/2021  ? ? ? ?Ned Clines CMA ?Clinical Pharmacist Assistant ?[phone] ? ?

## 2021-07-11 NOTE — Telephone Encounter (Incomplete Revision)
Transition Care Management Unsuccessful Follow-up Telephone Call ? ?Date of discharge and from where: [place] 07-10-21 Dx: acute on chronic CHF  ? ?Attempts:  1st Attempt ? ?Reason for unsuccessful TCM follow-up call:  Left voice message ? ?Transition Care Management Unsuccessful Follow-up Telephone Call ? ?Date of discharge and from where:  [place] 07-10-21 Dx: acute on chronic CHF ? ?Attempts:  2nd Attempt ? ?Reason for unsuccessful TCM follow-up call:  Left voice message ? ?  ? ?  ?

## 2021-07-13 ENCOUNTER — Telehealth (HOSPITAL_COMMUNITY): Payer: Self-pay | Admitting: Emergency Medicine

## 2021-07-13 NOTE — Telephone Encounter (Signed)
Attempted to call patient regarding upcoming cardiac MR appointment. Left message on voicemail with name and callback number Ashawna Hanback RN Navigator Cardiac Imaging [facility] [phone] Office [phone] Cell  

## 2021-07-14 ENCOUNTER — Encounter: Payer: Self-pay | Admitting: Internal Medicine

## 2021-07-14 ENCOUNTER — Telehealth: Payer: Self-pay | Admitting: Internal Medicine

## 2021-07-14 ENCOUNTER — Ambulatory Visit (HOSPITAL_COMMUNITY)
Admission: RE | Admit: 2021-07-14 | Discharge: 2021-07-14 | Disposition: A | Discharge status: Home / Self Care | Payer: Medicare HMO | Source: Ambulatory Visit | Attending: Internal Medicine | Admitting: Internal Medicine

## 2021-07-14 ENCOUNTER — Encounter (HOSPITAL_COMMUNITY): Payer: Self-pay

## 2021-07-14 DIAGNOSIS — I422 Other hypertrophic cardiomyopathy: Secondary | ICD-10-CM | POA: Insufficient documentation

## 2021-07-14 LAB — POCT I-STAT CREATININE: Creatinine, Ser: 2.4 mg/dL — ABNORMAL HIGH (ref 0.44–1.00)

## 2021-07-14 NOTE — Progress Notes (Signed)
? ?Chronic Care Management ?Pharmacy Note ? ?07/27/2021 ?Name:  Paula APP MRN:  [id number] DOB:  Stein ? ?Summary: ?Pt is not taking medications as prescribed ?LDL not at goal < 55 ?Pt quit smoking 1 month ago ? ?Recommendations/Changes made from today's visit: ?-Requested refills for furosemide and isosorbide ?-Recommended nephrology referral ?-Recommend high intensity statin therapy  ?-Recommend repeat uric acid level, TSH, and lipid panel ?-Consider transitioning to [facility] for adherence packaging ?-Recommend taking allopurinol daily for gout prevention ? ?Plan: ?BP and HF assessment in 1 month ?PAP for Entresto and Xarelto ?Follow up in 3 months ? ? ?Subjective: ?Paula Stein is an 76 y.o. year old female who is a primary patient of [name], MD.  The CCM team was consulted for assistance with disease management and care coordination needs.   ? ?Engaged with patient by telephone for initial visit in response to provider referral for pharmacy case management and/or care coordination services.  ? ?Consent to Services:  ?The patient was given the following information about Chronic Care Management services today, agreed to services, and gave verbal consent: 1. CCM service includes personalized support from designated clinical staff supervised by the primary care provider, including individualized plan of care and coordination with other care providers 2. 24/7 contact phone numbers for assistance for urgent and routine care needs. 3. Service will only be billed when office clinical staff spend 20 minutes or more in a month to coordinate care. 4. Only one practitioner may furnish and bill the service in a calendar month. 5.The patient may stop CCM services at any time (effective at the end of the month) by phone call to the office staff. 6. The patient will be responsible for cost sharing (co-pay) of up to 20% of the service fee (after annual deductible is met). Patient agreed to services and  consent obtained. ? ?Patient Care Team: ?[name], MD as PCP - General (Family Medicine) ?[name], MD as PCP - Cardiology (Cardiology) ?[name], MD as PCP - Electrophysiology (Cardiology) ?[name], DO as Consulting Physician (Gastroenterology) ?Viona Gilmore, [facility] as Pharmacist (Pharmacist) ? ?Recent office visits: ?01/28/21 - Patient presented for removal of sutures. No medication changes. ? ?01/14/21  Grier Mitts R - Patient presented for Type 2 diabetes mellitus with diabetic neuropathy without long term current use of insulin and other concerns. No medication changes. ? ?Recent consult visits: ?06/08/21 [name], MD (Cardiology) - Patient presented for Hypertrophic Cardiomyopathy. Stopped Atorvastatin, Stopped Dicyclomine, Stopped Gabapentin, Stopped Hydroxychloroquine, Stopped Omeprazole. Increased Entresto to 49/51 twice daily ?  ?06/06/21 Sabino Niemann ([place]) - Patient presented for Inflammatory arthritis and other concerns. No medication changes. ?  ?04/29/21 [name] (Cardiology) - Patient presented for Other Cardiomyopathies. No other visit details available. ?  ?01/28/21 [name] (Cardiology) - Patient presented for [facility]. No other visit details available. ?  ?01/13/21 Magrinat, Virgie Dad, MD (Oncology) - Patient presented for Portal vein thrombosis and other concerns. No medication changes. ? ?Hospital visits: ?Medication Reconciliation was completed by comparing discharge summary, patient?s EMR and Pharmacy list, and upon discussion with patient. ?  ?Patient presented to [facility] on 07/06/21 due to Acute on chronic systolic Congestive Heart Failure. Patient was present for 4 days. ?  ?New?Medications Started at [facility] Discharge:?? ?-started  ?polyethylene glycol (MIRALAX / GLYCOLAX) ?sacubitril-valsartan (ENTRESTO ?  ?Medication Changes at Hospital Discharge: ?-Changed  ?None ?  ?Medications Discontinued at Hospital  Discharge: ?-  Stopped  ?Entresto 49-51 MG (sacubitril-valsartan) ?  ?Medications that remain the same after Hospital Discharge:??  ?-All other medications will remain the same.   ?  ?  ?Patient presented to [facility] on 01/17/21 due to finger laceration. Patient was present for 2 hours. ?  ?New?Medications Started at [facility] Discharge:?? ?-started  ?none ?  ?Medication Changes at Hospital Discharge: ?-Changed  ?None ?  ?Medications Discontinued at Hospital Discharge: ?-Stopped  ?none ?  ?Medications that remain the same after Hospital Discharge:??  ?-All other medications will remain the same. ? ? ?Objective: ? ?Lab Results  ?Component Value Date  ? CREATININE 1.68 (H) 07/21/2021  ? BUN 31 (H) 07/21/2021  ? GFR 29.07 (L) 07/21/2021  ? GFRNONAA 28 (L) 07/10/2021  ? GFRAA 40 (L) 03/31/2020  ? NA 140 07/21/2021  ? K 4.4 07/21/2021  ? CALCIUM 9.7 07/21/2021  ? CO2 28 07/21/2021  ? GLUCOSE 121 (H) 07/21/2021  ? ? ?Lab Results  ?Component Value Date/Time  ? HGBA1C 6.9 (H) 07/21/2021 11:15 AM  ? HGBA1C 6.0 (A) 01/14/2021 11:28 AM  ? HGBA1C 6.0 (H) 03/14/2020 04:58 PM  ? GFR 29.07 (L) 07/21/2021 11:15 AM  ? GFR 52.34 (L) 08/13/2019 10:27 AM  ? MICROALBUR 33.5 (H) 06/02/2015 10:07 AM  ? MICROALBUR 36.1 (H) 05/13/2014 02:34 PM  ?  ?Last diabetic Eye exam:  ?Lab Results  ?Component Value Date/Time  ? HMDIABEYEEXA No Retinopathy 08/28/2017 12:00 AM  ?  ?Last diabetic Foot exam:  ?Lab Results  ?Component Value Date/Time  ? HMDIABFOOTEX yes 04/20/2010 12:00 AM  ?  ? ?Lab Results  ?Component Value Date  ? CHOL 228 (H) 02/06/2019  ? HDL 91 02/06/2019  ? LDLCALC 109 (H) 02/06/2019  ? LDLDIRECT 195.2 02/15/2011  ? TRIG 165 (H) 02/06/2019  ? CHOLHDL 2.5 02/06/2019  ? ? ? ?  Latest Ref Rng & Units 07/07/2021  ?  3:57 AM 03/15/2020  ? 12:58 AM 03/13/2020  ? 10:18 PM  ?Hepatic Function  ?Total Protein 6.5 - 8.1 g/dL 6.8   6.4   7.3    ?Albumin 3.5 - 5.0 g/dL 2.8   2.8   3.1    ?AST 15 - 41 U/L 14   11   15     ?ALT 0 - 44 U/L 17    11   13     ?Alk Phosphatase 38 - 126 U/L 71   80   103    ?Total Bilirubin 0.3 - 1.2 mg/dL 0.3   0.5   0.8    ? ? ?Lab Results  ?Component Value Date/Time  ? TSH 5.23 (H) 03/31/2020 09:19 AM  ? TSH 0.01 (L) 11/24/2019 09:01 AM  ? FREET4 0.86 03/13/2018 06:35 PM  ? FREET4 1.16 (H) 05/22/2017 02:25 PM  ? ? ? ?  Latest Ref Rng & Units 07/07/2021  ?  3:57 AM 07/06/2021  ?  7:37 PM 03/31/2020  ?  9:19 AM  ?CBC  ?WBC 4.0 - 10.5 K/uL 7.6   8.1   5.3    ?Hemoglobin 12.0 - 15.0 g/dL 11.2   11.7   13.4    ?Hematocrit 36.0 - 46.0 % 35.9   38.8   41.2    ?Platelets 150 - 400 K/uL 201   228   299.0    ? ? ?No results found for: VD25OH ? ?Clinical ASCVD: Yes  ?The 10-year ASCVD risk score (Arnett DK, et al., 2019) is: 69.4% ?  Values used to calculate the  score: ?    Age: 69 years ?    Sex: Female ?    Is Non-Hispanic African American: Yes ?    Diabetic: Yes ?    Tobacco smoker: Yes ?    Systolic Blood Pressure: 836 mmHg ?    Is BP treated: Yes ?    HDL Cholesterol: 91 mg/dL ?    Total Cholesterol: 228 mg/dL   ? ? ?  10/20/2020  ?  2:41 PM 10/20/2020  ?  2:39 PM 10/21/2019  ?  9:16 AM  ?Depression screen PHQ 2/9  ?Decreased Interest 0 0 0  ?Down, Depressed, Hopeless 0 0 0  ?PHQ - 2 Score 0 0 0  ?Altered sleeping   0  ?Tired, decreased energy   0  ?Change in appetite   0  ?Feeling bad or failure about yourself    0  ?Trouble concentrating   0  ?Moving slowly or fidgety/restless   0  ?Suicidal thoughts   0  ?PHQ-9 Score   0  ?Difficult doing work/chores   Not difficult at all  ?  ? ? ?Social History  ? ?Tobacco Use  ?Smoking Status Light Smoker  ? Packs/day: 0.25  ? Years: 40.00  ? Pack years: 10.00  ? Types: Cigarettes  ?Smokeless Tobacco Never  ?Tobacco Comments  ? on and off,currently a pack / week ( 10/05/20)  ? ?BP Readings from Last 3 Encounters:  ?07/21/21 120/68  ?07/10/21 119/77  ?06/08/21 (!) 164/80  ? ?Pulse Readings from Last 3 Encounters:  ?07/21/21 72  ?07/10/21 75  ?06/08/21 77  ? ?Wt Readings from Last 3 Encounters:   ?07/21/21 220 lb 12.8 oz (100.2 kg)  ?07/10/21 226 lb 13.7 oz (102.9 kg)  ?06/08/21 230 lb (104.3 kg)  ? ?BMI Readings from Last 3 Encounters:  ?07/21/21 31.68 kg/m?  ?07/10/21 32.55 kg/m?  ?06/08/21 33.00

## 2021-07-14 NOTE — Progress Notes (Signed)
Patient here today at [facility] for MRI cardiac scan w wo contrast. Patient has Environmental education officer. I stat creatinine was requested prior to scan and was 2.4 mg/dl up from 1.86 on 4/9. [place] MRI tech spoke with Dr. Acharya-Cardiology who recommends rescheduling patient at this time unless patient would do without contrast. Patient is understanding of situation and will reschedule at this time. This RN advises patient to follow up with her doctor about increasing creatinine. Patient also states "my intake is less than my outputs, however I'm still urinating frequently". Patient is taking medications as directed.  ?

## 2021-07-14 NOTE — Telephone Encounter (Signed)
Patient states she was not able to do her MRI. She says her creatinine was up 2.4 and last week is was 1.6. She says they told her there was not enough water so there was major strain on the kidney's.  ?

## 2021-07-14 NOTE — Telephone Encounter (Signed)
Will forward to Dr. [name] to review and advise. ?

## 2021-07-15 ENCOUNTER — Ambulatory Visit (INDEPENDENT_AMBULATORY_CARE_PROVIDER_SITE_OTHER): Payer: Medicare HMO | Admitting: Pharmacist

## 2021-07-15 ENCOUNTER — Telehealth: Payer: Self-pay | Admitting: Pharmacist

## 2021-07-15 DIAGNOSIS — E1140 Type 2 diabetes mellitus with diabetic neuropathy, unspecified: Secondary | ICD-10-CM

## 2021-07-15 DIAGNOSIS — I5042 Chronic combined systolic (congestive) and diastolic (congestive) heart failure: Secondary | ICD-10-CM

## 2021-07-15 DIAGNOSIS — I10 Essential (primary) hypertension: Secondary | ICD-10-CM

## 2021-07-15 MED ORDER — FUROSEMIDE 40 MG PO TABS: 40.0000 mg | ORAL_TABLET | Freq: Two times a day (BID) | ORAL | 3 refills | Status: DC

## 2021-07-15 MED ORDER — ISOSORBIDE MONONITRATE ER 30 MG PO TB24: ORAL_TABLET | ORAL | 0 refills | Status: DC

## 2021-07-15 NOTE — Patient Instructions (Signed)
Hi [name], ? ?It was great to get to meet you over the telephone! Below is a summary of some of the topics we discussed.  ? ?Please start taking calcium citrate 600 mg with vitamin D 1000 units daily to make sure to support your bones like we discussed. Also keep up with checking your blood pressure, blood sugars and weights regularly. ? ?Please reach out to me if you have any questions or need anything before our follow up! ? ?Best, ?Maddie ? ?[name], PharmD, BCACP ?Clinical Pharmacist ?Therapist, music at [place] ?[phone] ? ? Visit Information ? ? Goals Addressed   ? ?  ?  ?  ?  ? This Visit's Progress  ?  Manage My Medicine     ?  Timeframe:  Long-Range Goal ?Priority:  High ?Start Date:                             ?Expected End Date:                      ? ?Follow Up Date 09/14/21  ?  ?- call for medicine refill 2 or 3 days before it runs out ?- keep a list of all the medicines I take; vitamins and herbals too ?- use a pillbox to sort medicine ?- use an alarm clock or phone to remind me to take my medicine  ?  ?Why is this important?   ?These steps will help you keep on track with your medicines. ?  ?Notes:  ?  ?  Track and Manage My Blood Pressure-Hypertension     ?  Timeframe:  Long-Range Goal ?Priority:  High ?Start Date:                             ?Expected End Date:                      ? ?Follow Up Date 09/14/21  ?  ?- check blood pressure weekly ?- choose a place to take my blood pressure (home, clinic or office, retail store) ?- write blood pressure results in a log or diary  ?  ?Why is this important?   ?You won't feel high blood pressure, but it can still hurt your blood vessels.  ?High blood pressure can cause heart or kidney problems. It can also cause a stroke.  ?Making lifestyle changes like losing a little weight or eating less salt will help.  ?Checking your blood pressure at home and at different times of the day can help to control blood pressure.  ?If the doctor prescribes  medicine remember to take it the way the doctor ordered.  ?Call the office if you cannot afford the medicine or if there are questions about it.   ?  ?Notes:  ?  ? ?  ? ?Patient Care Plan: CCM Pharmacist Care Plan  ?  ? ?Problem Identified: Problem: Hypertension, Hyperlipidemia, Diabetes, Heart Failure, Hypothyroidism, and Gout   ?  ? ?Long-Range Goal: Patient-Specific Goal   ?Start Date: 07/15/2021  ?Expected End Date: 07/16/2022  ?This Visit's Progress: On track  ?Priority: High  ?Note:   ?Current Barriers:  ?Unable to independently monitor therapeutic efficacy ?Unable to achieve control of cholesterol  ?Unable to self administer medications as prescribed ? ?Pharmacist Clinical Goal(s):  ?Patient will achieve adherence to monitoring guidelines and medication adherence to achieve  therapeutic efficacy ?achieve control of cholesterol as evidenced by next lipid panel  through collaboration with PharmD and provider.  ? ?Interventions: ?1:1 collaboration with [name], MD regarding development and update of comprehensive plan of care as evidenced by provider attestation and co-signature ?Inter-disciplinary care team collaboration (see longitudinal plan of care) ?Comprehensive medication review performed; medication list updated in electronic medical record ? ?Hypertension (BP goal <130/80) ?-Not ideally controlled ?-Current treatment: ?Entresto 97-103 mg 1 tablet twice daily - Appropriate, Query effective, Safe, Accessible ?Spironolactone 50 mg 1 tablet daily - Appropriate, Query effective, Safe, Accessible ?Carvedilol 25 mg 1 tablet twice daily - Appropriate, Query effective, Safe, Accessible ?-Medications previously tried: n/a  ?-Current home readings: does not check consistently ?-Current dietary habits: uses Mrs. Dash ?-Current exercise habits: was doing chair exercises at the [facility] [place] ?-Denies hypotensive/hypertensive symptoms ?-Educated on BP goals and benefits of medications for prevention of heart attack,  stroke and kidney damage; ?Daily salt intake goal < 2300 mg; ?Importance of home blood pressure monitoring; ?Proper BP monitoring technique; ?-Counseled to monitor BP at home at least weekly, document, and provide log at future appointments ?-Counseled on diet and exercise extensively ?Recommended to continue current medication ? ?Hyperlipidemia: (LDL goal < 55) ?-Uncontrolled ?-Current treatment: ?No medication ?-Medications previously tried: atorvastatin (cramping)  ?-Current dietary patterns: eating at home mostly ?-Current exercise habits: not consistently ?-Educated on Cholesterol goals;  ?Benefits of statin for ASCVD risk reduction; ?Importance of limiting foods high in cholesterol; ?-Counseled on diet and exercise extensively ?Recommended high intensity statin therapy. ? ?CAD/PAD (Goal: prevent events) ?-Uncontrolled ?-Current treatment  ?Xarelto 20 mg 1 tablet daily - Appropriate, Effective, Safe, Query accessible ?Isosorbide mononitrate 30 mg 1 tablet daily (not taking consistently) - Appropriate, Query effective, Safe, Accessible ?-Medications previously tried: n/a  ?-Recommended taking isosorbide daily as prescribed. ? ?Diabetes (A1c goal <7%) ?-Controlled ?-Current medications: ?Glipizide 2.5 mg 1 tablet daily - Appropriate, Effective, Query Safe, Accessible ?-Medications previously tried: none  ?-Current home glucose readings ?fasting glucose: 109-120 every day ?post prandial glucose: does not check ?-Denies hypoglycemic/hyperglycemic symptoms ?-Current meal patterns:  ?breakfast:  eating around 9:30 - shredded wheat or cheerios and piece of toast and bacon ?lunch: usually skips or a snack - sandwich or salad ?dinner: 6:30-7:30 - pasta, chicken, shrimp, meatloaf; chicken breast with spinach ?snacks: n/a ?drinks: tea, water, zero sugar coke (one bottle every 3 days), lots of cranberry juice ?-Current exercise: not doing anything right now ?-Educated on A1c and blood sugar goals; ?Complications of  diabetes including kidney damage, retinal damage, and cardiovascular disease; ?Benefits of routine self-monitoring of blood sugar; ?Carbohydrate counting and/or plate method ?-Counseled to check feet daily and get yearly eye exams ?-Counseled on diet and exercise extensively ?Recommended to continue current medication ?Mail healthy plate handout. ? ?Heart Failure (Goal: manage symptoms and prevent exacerbations) ?-Uncontrolled ?-Last ejection fraction: 07/07/21 (Date: 30-35%) ?-HF type: Combined Systolic and Diastolic ?-NYHA Class: II (slight limitation of activity) ?-AHA HF Stage: C (Heart disease and symptoms present) ?-Current treatment: ?Entresto 97-103 mg 1 tablet twice daily - Appropriate, Effective, Safe, Query accessible ?Spironolactone 50 mg 1 tablet daily - Appropriate, Effective, Safe, Accessible ?Furosemide 40 mg 1 tablet twice daily (not taking consistently) - Appropriate, Query effective, Safe, Accessible ?Isosorbide mononitrate 30 mg 1 tablet daily - Appropriate, Effective, Safe, Accessible ?Carvedilol 25 mg 1 tablet twice daily - Appropriate, Effective, Safe, Accessible ?-Medications previously tried: unknown  ?-Current home BP/HR readings: goes up and down - 180/72 down to 120/72 ?-Current  dietary habits: eating out often; frozen vegetables more than canned ?-Current exercise habits: not consistently  ?-Educated on Benefits of medications for managing symptoms and prolonging life ?Importance of weighing daily; if you gain more than 3 pounds in one day or 5 pounds in one week, call cardiologist. ?Proper diuretic administration and potassium supplementation ?Importance of blood pressure control ?-Counseled on diet and exercise extensively ?Recommended to continue current medication ?Recommended taking furosemide daily as prescribed. ? ?Portal vein thrombosis (Goal: prevent clots) ?-Controlled ?-Current treatment  ?Xarelto 20 mg 1 tablet daily with supper - Appropriate, Effective, Safe, Query  accessible ?-Medications previously tried: none  ?-Recommended to continue current medication ?Assessed patient finances. Apply for PAP for Xarelto. ? ?Gout (Goal: uric acid < 6 and prevent flare ups) ?-Uncontrolled ?-Current

## 2021-07-15 NOTE — Telephone Encounter (Signed)
Pt's medications were sent to pt's pharmacy as requested. Confirmation received.  

## 2021-07-15 NOTE — Telephone Encounter (Signed)
Spoke with patient to go through her medications. She is completely out of both the furosemide and isosorbide. She requested urgent refills to be sent to her [place] to pick up today if possible. Routing to cardiology for refills. ?

## 2021-07-18 ENCOUNTER — Telehealth: Payer: Self-pay | Admitting: Pharmacist

## 2021-07-18 NOTE — Telephone Encounter (Signed)
Called and left message for patient to call back to order/schedule a BMET per Dr. [name] prior to hospital follow-up visit 07/20/21. ? ?Per Dr. [name]: ?She should get another BMET today oir tomorrow in anticipation of her [place] visit 4/19   ? ?Awaiting return call from patient at this time. ?

## 2021-07-18 NOTE — Telephone Encounter (Addendum)
Error

## 2021-07-19 NOTE — Telephone Encounter (Signed)
Left additional message for patient to call and let us know if she could come in for BMET prior to hospital f/u visit. Did give her the option of going to any [facility] if we are too far for her. Awaiting call back. ?

## 2021-07-20 ENCOUNTER — Inpatient Hospital Stay: Payer: Medicare HMO | Admitting: Family Medicine

## 2021-07-21 ENCOUNTER — Ambulatory Visit (INDEPENDENT_AMBULATORY_CARE_PROVIDER_SITE_OTHER): Payer: Medicare HMO | Admitting: Family Medicine

## 2021-07-21 VITALS — BP 120/68 | HR 72 | Temp 98.1°F | Wt 220.8 lb

## 2021-07-21 DIAGNOSIS — G4700 Insomnia, unspecified: Secondary | ICD-10-CM | POA: Diagnosis not present

## 2021-07-21 DIAGNOSIS — Z7901 Long term (current) use of anticoagulants: Secondary | ICD-10-CM | POA: Diagnosis not present

## 2021-07-21 DIAGNOSIS — E1142 Type 2 diabetes mellitus with diabetic polyneuropathy: Secondary | ICD-10-CM

## 2021-07-21 DIAGNOSIS — I5043 Acute on chronic combined systolic (congestive) and diastolic (congestive) heart failure: Secondary | ICD-10-CM | POA: Diagnosis not present

## 2021-07-21 DIAGNOSIS — N184 Chronic kidney disease, stage 4 (severe): Secondary | ICD-10-CM | POA: Diagnosis not present

## 2021-07-21 DIAGNOSIS — E890 Postprocedural hypothyroidism: Secondary | ICD-10-CM | POA: Diagnosis not present

## 2021-07-21 DIAGNOSIS — N1832 Chronic kidney disease, stage 3b: Secondary | ICD-10-CM

## 2021-07-21 LAB — BASIC METABOLIC PANEL
BUN: 31 mg/dL — ABNORMAL HIGH (ref 6–23)
CO2: 28 mEq/L (ref 19–32)
Calcium: 9.7 mg/dL (ref 8.4–10.5)
Chloride: 104 mEq/L (ref 96–112)
Creatinine, Ser: 1.68 mg/dL — ABNORMAL HIGH (ref 0.40–1.20)
GFR: 29.07 mL/min — ABNORMAL LOW (ref >60.00)
Glucose, Bld: 121 mg/dL — ABNORMAL HIGH (ref 70–99)
Potassium: 4.4 mEq/L (ref 3.5–5.1)
Sodium: 140 mEq/L (ref 135–145)

## 2021-07-21 LAB — HEMOGLOBIN A1C: Hgb A1c MFr Bld: 6.9 % — ABNORMAL HIGH (ref 4.6–6.5)

## 2021-07-21 LAB — BRAIN NATRIURETIC PEPTIDE: Pro B Natriuretic peptide (BNP): 96 pg/mL (ref 0.0–100.0)

## 2021-07-21 MED ORDER — TRAZODONE HCL 50 MG PO TABS: 25.0000 mg | ORAL_TABLET | Freq: Every evening | ORAL | 1 refills | Status: DC | PRN

## 2021-07-21 NOTE — Patient Instructions (Signed)
I sent in a prescription for Trazodone the medication you were given as needed for sleep in the hospital.   ? ?We are rechecking your kidney function.  If your creatinine continues to remain elevated we will need to adjust your Lasix dose.  And send you to see the nephrologist (kidney doctor). ?

## 2021-07-21 NOTE — Progress Notes (Signed)
Subjective:  ? ? Patient ID: Paula Stein, female    DOB: 1945-06-02, 76 y.o.   MRN: [id number] ? ?Chief Complaint  ?Patient presents with  ? Follow-up  ?  Hospital f/u. CHF ?Having trouble sleeping.  ? ? 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  Pt hospitalized 4/5-07/10/21 for CHF exacerbation due to being out of lactone x1 week.  BMP was 533 at time of presentation.  CXR with evidence of pulmonary vascular congestion and pulmonary edema with small bilateral pleural effusions.  IV Lasix started.  Cards consulted.  ECHO with grade ! Diastolic dysfunction, EF 18-84%.  Entresto dose increased to 97-103 mg twice daily. ? ?Pt states she is feeling better.  Back on all meds.  Denies SOB, dizziness, CP, LE edema.  Pt endorses insomnia x a while.  States was given medication in hospital which really helped. ? ?Past Medical History:  ?Diagnosis Date  ? AICD (automatic cardioverter/defibrillator) present 10/08/2014  ? SUBQ    /    DR [name]  ? Cataract   ? CHF (congestive heart failure) ([place])   ? Diabetes mellitus without complication ([place])   ? GERD (gastroesophageal reflux disease)   ? History of cardiac cath 05/05/2007  ? normal-with patent coronaries  ? History of colonoscopy   ? History of hiatal hernia   ? Hyperlipidemia   ? Hypertension   ? Non-ischemic cardiomyopathy ([place])   ? EF 28%- reassessment of LV function 2011 with LVEf 45-50%  ? PAD (peripheral artery disease) ([place])   ? lower extremities with ABIs of 0.5 bilaterally  ? Personal history of goiter   ? S/P radioactive iodine thyroid ablation   ? Sleep apnea   ? uses CPAP  ? ? ?Allergies  ?Allergen Reactions  ? Ibuprofen Nausea Only and Other (See Comments)  ? ? ?ROS ?General: Denies fever, chills, night sweats, changes in weight, changes in appetite ?HEENT: Denies headaches, ear pain, changes in vision, rhinorrhea, sore throat ?CV:  Denies CP, palpitations, SOB, orthopnea ?Pulm: Denies SOB, cough, wheezing ?GI: Denies abdominal pain, nausea, vomiting, diarrhea, constipation ?GU: Denies dysuria, hematuria, frequency, vaginal discharge ?Msk: Denies muscle cramps, joint pains ?Neuro: Denies weakness, numbness, tingling ?Skin: Denies rashes, bruising ?Psych: Denies depression, anxiety, hallucinations ? ?   ?Objective:  ?  ?Blood pressure 120/68, pulse 72, temperature 98.1 ?F (36.7 ?C), temperature source Oral, weight 220 lb 12.8 oz (100.2 kg), SpO2 100 %. ? ?Gen. Pleasant, well-nourished, in no distress, normal affect   ?HEENT: [place]/AT, face symmetric, conjunctiva clear, no scleral icterus, PERRLA, EOMI, nares patent without drainage ?Lungs: no accessory muscle use, CTAB, no wheezes or rales ?Cardiovascular: RRR, no m/r/g, no peripheral edema ?Abdomen: BS present, soft, NT/ND ?Musculoskeletal: No deformities, no cyanosis or clubbing, normal tone ?Neuro:  A&Ox3, CN II-XII intact, normal gait ?Skin:  Warm, no lesions/ rash ? ?Wt Readings from Last 3 Encounters:  ?07/21/21 220 lb 12.8 oz (100.2 kg)  ?07/10/21 226 lb 13.7 oz (102.9 kg)  ?06/08/21 230 lb (104.3 kg)  ? ? ?Lab Results  ?Component Value Date  ? WBC 7.6 07/07/2021  ? HGB 11.2 (L) 07/07/2021  ? HCT 35.9 (L) 07/07/2021  ? PLT 201 07/07/2021  ? GLUCOSE 127 (H) 07/10/2021  ? CHOL 228 (H) 02/06/2019  ? TRIG 165 (H) 02/06/2019  ? HDL 91 02/06/2019  ? LDLDIRECT 195.2 02/15/2011  ?  LDLCALC 109 (H) 02/06/2019  ? ALT 17 07/07/2021  ? AST 14 (L) 07/07/2021  ? NA 139 07/10/2021  ? K 4.3 07/10/2021  ? CL 101 07/10/2021  ? CREATININE 2.40 (H) 07/14/2021  ? BUN 38 (H) 07/10/2021  ? CO2 29 07/10/2021  ? TSH 5.23 (H) 03/31/2020  ? INR 0.9 10/01/2014  ? HGBA1C 6.0 (A) 01/14/2021  ? MICROALBUR 33.5 (H) 06/02/2015  ? ? ?Assessment/Plan: ? ?Acute on chronic combined systolic and diastolic CHF (congestive heart failure) ([place])  ?-BMP 533.5 at time of admission ?-Echo with EF 30-35%.  LV demonstrates global  hypokinesis.  Mild LVH.  Grade 1 diastolic dysfunction.  Right ventricular systolic function is moderately reduced.  LA size mildly dilated.  Pericardial effusion is posterior to the LV.  No significant change from prior study 08/12/2020. ?-continue spironolactone 50 mg, lasix 40 mg BID, entresto 97-103 mg BID, imdur 30 mg daily ?-Continue lifestyle modifications ?-Daily weights ?-Keep follow-up with cardiology ?- Plan: Basic metabolic panel, Brain Natriuretic Peptide ? ?Type 2 diabetes mellitus with diabetic polyneuropathy, without long-term current use of insulin ([place])  ?-hgb A1C 6.0% on 01/14/21 ?-continue glipizide xl 2.5 mg daily ?-lifestyle modifications ?- Plan: Hemoglobin A1c ? ?Chronic anticoagulation ?-h/o portal vein thrombosis ?-continue xarelto ? ?Postoperative hypothyroidism ?-continue synthroid  ? ?Insomnia, unspecified type  ?-sleep hygiene discussed ?-trazodone worked well in hospital, will start prn at home. ?- Plan: traZODone (DESYREL) 50 MG tablet ? ?Chronic kidney disease, stage 4 (severe) ([facility])  ?-Previously with baseline creatinine 1.3-1.6, stage IIIb, however creatinine 1.86 at d/c and 2.4 on 07/14/21 ?-check BMP and refer to Nephrology ?- Plan: Basic metabolic panel, Ambulatory referral to Nephrology ? ?F/u in 1 month ? ?Grier Mitts, MD ?

## 2021-07-21 NOTE — Telephone Encounter (Addendum)
Pt completed hospital f/u visit with PCP, Dr Volanda Napoleon.  BMET, BNP and HgbA1-C completed as well per Epic.  Pt has not returned call to office but is scheduled to see Dr Burt Knack on 08/12/2021. ?

## 2021-07-28 ENCOUNTER — Telehealth: Payer: Self-pay | Admitting: Pharmacist

## 2021-07-28 NOTE — Chronic Care Management (AMB) (Signed)
? ? ?Chronic Care Management ?Pharmacy Assistant  ? ?Name: Paula Stein  MRN: [id number] DOB: [birth year] ? ?Reason for Encounter: Patient Assistance Applications for Xarelto and Entresto ?  ?Recent office visits:  ?07/21/21 Paula Ruddy, MD - Patient presented for Acute on Chronic combined systolic and diastolic CHF and other concerns. Prescribed Trazodone  25-50 mg PRN.  ? ?Recent consult visits:  ?None ? ?Hospital visits:  ?Medication Reconciliation was completed by comparing discharge summary, patient?s EMR and Pharmacy list, and upon discussion with patient. ?  ?Patient presented to [facility] on 07/06/21 due to Acute on chronic systolic Congestive Heart Failure. Patient was present for 4 days. ?  ?New?Medications Started at [facility] Discharge:?? ?-started  ?polyethylene glycol (MIRALAX / GLYCOLAX) ?sacubitril-valsartan (ENTRESTO ?  ?Medication Changes at Hospital Discharge: ?-Changed  ?None ?  ?Medications Discontinued at Hospital Discharge: ?-Stopped  ?Entresto 49-51 MG (sacubitril-valsartan) ?  ?Medications that remain the same after Hospital Discharge:??  ?-All other medications will remain the same.   ?  ?  ?Patient presented to [facility] on 01/17/21 due to finger laceration. Patient was present for 2 hours. ?  ?New?Medications Started at [facility] Discharge:?? ?-started  ?none ?  ?Medication Changes at Hospital Discharge: ?-Changed  ?None ?  ?Medications Discontinued at Hospital Discharge: ?-Stopped  ?none ?  ?Medications that remain the same after Hospital Discharge:??  ?-All other medications will remain the same. ? ?Medications: ?Outpatient Encounter Medications as of 07/28/2021  ?Medication Sig  ? ACCU-CHEK GUIDE test strip USE TO CHECK BLOOD SUGAR DAILY  ? Accu-Chek Softclix Lancets lancets USE TO CHECK BLOOD SUGAR DAILY  ? acetaminophen (TYLENOL) 500 MG tablet Take 1 tablet (500 mg total) by mouth every 6 (six) hours as needed. (Patient taking differently: Take 500 mg by mouth  every 6 (six) hours as needed for mild pain.)  ? allopurinol (ZYLOPRIM) 100 MG tablet Take 1 tablet (100 mg total) by mouth daily. (Patient taking differently: Take 100 mg by mouth daily as needed (gout flare up).)  ? Blood Glucose Monitoring Suppl (ACCU-CHEK GUIDE) w/Device KIT 1 each by Does not apply route daily. Use to check blood sugar daily  ? carvedilol (COREG) 25 MG tablet Take 1 tablet (25 mg total) by mouth 2 (two) times daily with a meal. Pt needs to keep upcoming appt in Dec for further refills  ? dicyclomine (BENTYL) 10 MG capsule Take 10 mg by mouth every 6 (six) hours as needed for cramping.  ? furosemide (LASIX) 40 MG tablet Take 1 tablet (40 mg total) by mouth 2 (two) times daily.  ? glipiZIDE (GLUCOTROL XL) 2.5 MG 24 hr tablet TAKE 1 TABLET DAILY WITH BREAKFAST. (Patient taking differently: 2.5 mg daily with breakfast.)  ? isosorbide mononitrate (IMDUR) 30 MG 24 hr tablet TAKE 1 TABLET(30 MG) BY MOUTH DAILY  ? levothyroxine (SYNTHROID) 125 MCG tablet TAKE 1 TABLET EVERY MORNING ON AN EMPTY STOMACH 30 MINUTES BEFORE TAKING OTHER MEDICATIONS OR BEFORE BREAKFAST  ? polyethylene glycol (MIRALAX / GLYCOLAX) 17 g packet Take 17 g by mouth daily as needed for moderate constipation.  ? rivaroxaban (XARELTO) 20 MG TABS tablet Take 1 tablet (20 mg total) by mouth daily with supper.  ? sacubitril-valsartan (ENTRESTO) 97-103 MG Take 1 tablet by mouth 2 (two) times daily.  ? spironolactone (ALDACTONE) 50 MG tablet Take 1 tablet (50 mg total) by mouth daily.  ? traZODone (DESYREL) 50 MG tablet Take 0.5-1 tablets (25-50 mg total) by mouth at bedtime as needed  for sleep.  ? ?No facility-administered encounter medications on file as of 07/28/2021.  ?Notes: ?Patient Assistance applications pre filled for Entresto and Xarelto to be mailed to patient by Clinical Pharmacist with instructions for completion. ? ?Care Gaps: ?BP- 164/80 ( 06/08/21) ?Zoster Vaccine - Overdue ?COVID Booster - Overdue ?Foot Exam - Overdue ?Eye  Exam - Overdue ?AWV- 7/22 ?Lab Results  ?Component Value Date  ? HGBA1C 6.9 (H) 07/21/2021  ? ? ?Star Rating Drugs: ?Glipizide 2.5 mg - Last filled 04/03/21 90 DS at [facility] ?Verified as accurate ? ? ? ?Laresia Green CMA ?Clinical Pharmacist Assistant ?[phone] ? ?

## 2021-07-29 ENCOUNTER — Ambulatory Visit (INDEPENDENT_AMBULATORY_CARE_PROVIDER_SITE_OTHER): Payer: Medicare HMO

## 2021-07-29 DIAGNOSIS — I428 Other cardiomyopathies: Secondary | ICD-10-CM

## 2021-07-29 LAB — CUP PACEART REMOTE DEVICE CHECK
Battery Remaining Percentage: 82 %
Date Time Interrogation Session: 20230428070400
Implantable Lead Implant Date: 20160707
Implantable Lead Location: 753862
Implantable Lead Model: 3401
Implantable Pulse Generator Implant Date: 20210813
Pulse Gen Serial Number: 143555

## 2021-07-31 ENCOUNTER — Encounter: Payer: Self-pay | Admitting: Family Medicine

## 2021-07-31 DIAGNOSIS — E785 Hyperlipidemia, unspecified: Secondary | ICD-10-CM | POA: Diagnosis not present

## 2021-07-31 DIAGNOSIS — Z7984 Long term (current) use of oral hypoglycemic drugs: Secondary | ICD-10-CM | POA: Diagnosis not present

## 2021-07-31 DIAGNOSIS — E1140 Type 2 diabetes mellitus with diabetic neuropathy, unspecified: Secondary | ICD-10-CM

## 2021-07-31 DIAGNOSIS — I110 Hypertensive heart disease with heart failure: Secondary | ICD-10-CM | POA: Diagnosis not present

## 2021-07-31 DIAGNOSIS — F17210 Nicotine dependence, cigarettes, uncomplicated: Secondary | ICD-10-CM | POA: Diagnosis not present

## 2021-07-31 DIAGNOSIS — I5042 Chronic combined systolic (congestive) and diastolic (congestive) heart failure: Secondary | ICD-10-CM | POA: Diagnosis not present

## 2021-08-01 ENCOUNTER — Telehealth: Payer: Self-pay | Admitting: Pharmacist

## 2021-08-01 ENCOUNTER — Telehealth: Payer: Self-pay | Admitting: Cardiovascular Disease

## 2021-08-01 ENCOUNTER — Telehealth: Payer: Self-pay | Admitting: Internal Medicine

## 2021-08-01 DIAGNOSIS — I5022 Chronic systolic (congestive) heart failure: Secondary | ICD-10-CM

## 2021-08-01 DIAGNOSIS — I739 Peripheral vascular disease, unspecified: Secondary | ICD-10-CM

## 2021-08-01 DIAGNOSIS — I10 Essential (primary) hypertension: Secondary | ICD-10-CM

## 2021-08-01 DIAGNOSIS — E782 Mixed hyperlipidemia: Secondary | ICD-10-CM

## 2021-08-01 MED ORDER — CARVEDILOL 25 MG PO TABS: 25.0000 mg | ORAL_TABLET | Freq: Two times a day (BID) | ORAL | 3 refills | Status: DC

## 2021-08-01 MED ORDER — SACUBITRIL-VALSARTAN 97-103 MG PO TABS: 1.0000 | ORAL_TABLET | Freq: Two times a day (BID) | ORAL | 3 refills | Status: DC

## 2021-08-01 MED ORDER — SPIRONOLACTONE 50 MG PO TABS: 50.0000 mg | ORAL_TABLET | Freq: Every day | ORAL | 3 refills | Status: DC

## 2021-08-01 MED ORDER — ISOSORBIDE MONONITRATE ER 30 MG PO TB24: 30.0000 mg | ORAL_TABLET | Freq: Every day | ORAL | 3 refills | Status: DC

## 2021-08-01 MED ORDER — FUROSEMIDE 40 MG PO TABS: 40.0000 mg | ORAL_TABLET | Freq: Two times a day (BID) | ORAL | 3 refills | Status: DC

## 2021-08-01 NOTE — Progress Notes (Addendum)
? ? ?Chronic Care Management ?Pharmacy Assistant  ?Outreach Note ?Name: Paula Stein  MRN: [id number] DOB: 14-Sep-1945 ?  ?Referred by: [name], MD  ?Reason for referral: Chronic care management ? ?Reviewed chart for medication changes ahead of medication coordination call. ? ?BP Readings from Last 3 Encounters:  ?07/21/21 120/68  ?07/10/21 119/77  ?06/08/21 (!) 164/80  ?  ?Lab Results  ?Component Value Date  ? HGBA1C 6.9 (H) 07/21/2021  ?  ? ?Verbal consent obtained for [facility] enhanced pharmacy services (medication synchronization, adherence packaging, delivery coordination). A medication sync plan was created to allow patient to get all medications delivered once every 30 to 90 days per patient preference. Patient understands they have freedom to choose pharmacy and clinical pharmacist will coordinate care between all prescribers and [facility]. ? ?Patient requested to obtain medications through Adherence Packaging  30 Days  ? ?Med sync plan: ?Isosorbide monitrate 30 MG [name], MD     1   07/15/21 90 DS October 13, 2021  ?Furosemide 40 MG  [name], MD  Aug 01, 2021  1  1   07/15/21 90 DS October 13, 2021  ?Allopurinol 100 MG x    1     #30 tabs on hand as of 07/27/21 Aug 26, 2021  ?Carvedilol 25 MG  [name], MD  Aug 01, 2021  1  1   05/13/21 90 DS Aug 11, 2021  ?Entresto 97-103 MG  [name], MD  Aug 01, 2021  1  1   07/10/21 30 DS Aug 09, 2021  ?Xarelto 20 MG  Magrinat, Virgie Dad, MD  Aug 01, 2021    1   07/10/21 30 DS Aug 09, 2021  ?Levothyroxine 125 MCG x   1      # 14 tabs on hand as of 07/27/21 Aug 10, 2021  ?Spironolactone 50 MG [name], MD  Aug 01, 2021    1   07/10/21 30 DS Aug 09, 2021  ?Glipizide 2.5 MG x    1     #12 tabs on hand as of 07/27/21 Aug 08, 2021  ?Trazodone 50 MG x    P R N   Pt to call when needed  ? ? ?Prescriptions requested from PCP and specialists as follows: ?isosorbide mononitrate (IMDUR) 30  ?[name], MD  ?[address] [place],  [place] [address]  ?Phone:  [phone]    ?(Pt must keep upcoming appt in May 2023 with Dr. Burt Knack before anymore refills appt on 08/12/21 pharm advised on form) ? ?furosemide (LASIX) 40 MG  ?carvedilol (COREG) 25 MG  ?sacubitril-valsartan (ENTRESTO) 97-103 MG  ?spironolactone (ALDACTONE) 50 MG  ?[name], MD  ?[address] [place], [place] [address]  ?Phone:  [phone]    ?  ? ?rivaroxaban (XARELTO) 20 MG  ?Magrinat, Virgie Dad, MD (retired is now under Dr Virgina Jock) ?[place] ?[phone] ? ? ?Medications: ?Outpatient Encounter Medications as of 08/01/2021  ?Medication Sig  ? ACCU-CHEK GUIDE test strip USE TO CHECK BLOOD SUGAR DAILY  ? Accu-Chek Softclix Lancets lancets USE TO CHECK BLOOD SUGAR DAILY  ? allopurinol (ZYLOPRIM) 100 MG tablet Take 1 tablet (100 mg total) by mouth daily. (Patient taking differently: Take 100 mg by mouth daily as needed (gout flare up).)  ? Blood Glucose Monitoring Suppl (ACCU-CHEK GUIDE) w/Device KIT 1 each by Does not apply route daily. Use to check blood sugar  daily  ? carvedilol (COREG) 25 MG tablet Take 1 tablet (25 mg total) by mouth 2 (two) times daily with a meal. Pt needs to keep upcoming appt in Dec for further refills  ? dicyclomine (BENTYL) 10 MG capsule Take 10 mg by mouth every 6 (six) hours as needed for cramping.  ? furosemide (LASIX) 40 MG tablet Take 1 tablet (40 mg total) by mouth 2 (two) times daily.  ? glipiZIDE (GLUCOTROL XL) 2.5 MG 24 hr tablet TAKE 1 TABLET DAILY WITH BREAKFAST. (Patient taking differently: 2.5 mg daily with breakfast.)  ? isosorbide mononitrate (IMDUR) 30 MG 24 hr tablet TAKE 1 TABLET(30 MG) BY MOUTH DAILY  ? levothyroxine (SYNTHROID) 125 MCG tablet TAKE 1 TABLET EVERY MORNING ON AN EMPTY STOMACH 30 MINUTES BEFORE TAKING OTHER MEDICATIONS OR BEFORE BREAKFAST  ? rivaroxaban (XARELTO) 20 MG TABS tablet Take 1 tablet (20 mg total) by mouth daily with supper.  ? sacubitril-valsartan (ENTRESTO) 97-103 MG Take 1 tablet  by mouth 2 (two) times daily.  ? spironolactone (ALDACTONE) 50 MG tablet Take 1 tablet (50 mg total) by mouth daily.  ? traZODone (DESYREL) 50 MG tablet Take 0.5-1 tablets (25-50 mg total) by mouth at bedtime as needed for sleep.  ? ?No facility-administered encounter medications on file as of 08/01/2021.  ? ? ?Care Gaps: ?BP- 120/68 ( 07/21/21) ?COVID Booster - Overdue ?Foot Exam - Overdue ?Eye Exam - Overdue ?AWV- 7/22 ?Lab Results  ?Component Value Date  ? HGBA1C 6.9 (H) 07/21/2021  ? ? ?Star Rating Drugs: ?Glipizide 2.5 mg - Last filled 07/27/21 30 DS at [facility] ? ? ?Ned Clines CMA ?Clinical Pharmacist Assistant ?[phone] ? ?

## 2021-08-01 NOTE — Telephone Encounter (Signed)
Pt's medications were sent to pt's pharmacy as requested. Confirmation received.  

## 2021-08-01 NOTE — Telephone Encounter (Signed)
?*  STAT* If patient is at the pharmacy, call can be transferred to refill team. ? ? ?1. Which medications need to be refilled? (please list name of each medication and dose if known) Furosemide , Isosorbide and Carvedilol ? ?2. Which pharmacy/location (including street and city if local pharmacy) is medication to be sent to? [facility] ? ?3. Do they need a 30 day or 90 day supply? 30 days and refills ? ?

## 2021-08-01 NOTE — Telephone Encounter (Signed)
?*  STAT* If patient is at the pharmacy, call can be transferred to refill team. ? ? ?1. Which medications need to be refilled? (please list name of each medication and dose if known)  ? sacubitril-valsartan (ENTRESTO) 97-103 MG  ? ? spironolactone (ALDACTONE) 50 MG tablet  ? ?2. Which pharmacy/location (including street and city if local pharmacy) is medication to be sent to? [facility] - [place], [place] - [place] [address] ? ?3. Do they need a 30 day or 90 day supply?  ?30 day ? ?

## 2021-08-02 ENCOUNTER — Other Ambulatory Visit: Payer: Self-pay | Admitting: Hematology and Oncology

## 2021-08-02 ENCOUNTER — Telehealth: Payer: Self-pay

## 2021-08-02 ENCOUNTER — Telehealth: Payer: Self-pay | Admitting: *Deleted

## 2021-08-02 ENCOUNTER — Encounter: Payer: Self-pay | Admitting: Family Medicine

## 2021-08-02 DIAGNOSIS — E039 Hypothyroidism, unspecified: Secondary | ICD-10-CM

## 2021-08-02 DIAGNOSIS — M1A0790 Idiopathic chronic gout, unspecified ankle and foot, without tophus (tophi): Secondary | ICD-10-CM

## 2021-08-02 MED ORDER — RIVAROXABAN 20 MG PO TABS: 20.0000 mg | ORAL_TABLET | Freq: Every day | ORAL | 1 refills | Status: DC

## 2021-08-02 MED ORDER — ALLOPURINOL 100 MG PO TABS: 100.0000 mg | ORAL_TABLET | Freq: Every day | ORAL | 1 refills | Status: DC

## 2021-08-02 MED ORDER — LEVOTHYROXINE SODIUM 125 MCG PO TABS: ORAL_TABLET | ORAL | 1 refills | Status: DC

## 2021-08-02 MED ORDER — GLIPIZIDE ER 2.5 MG PO TB24: 2.5000 mg | ORAL_TABLET | Freq: Every day | ORAL | 1 refills | Status: DC

## 2021-08-02 NOTE — Telephone Encounter (Signed)
-----   Message from Viona Gilmore, [facility] sent at 07/29/2021  4:45 PM EDT ----- ?Regarding: Refills ?Hi, ? ?Paula Stein is going to try out adherence packaging with [facility], can you please send in a refill of the following medications for her? ? ?-allopurinol ?-glipizide ?-levothyroxine ? ?Thank you! ?Maddie ? ?

## 2021-08-02 NOTE — Telephone Encounter (Signed)
Patient contacted regarding refill of Xarelto prescription. She has changed pharmacy to [facility] and does not use [facility] or [facility].Marland Kitchen [facility] prepackages her meds in daily packs. ?Advised her that Dr. Lorenso Courier sent refill of Xarelto to [facility]. She stated she will call [facility] to find out how to transfer prescription.  ?Patient called back later in day with update - she states she spoke with a representative from [facility] and they will contact [facility] to transfer the prescription.  ?

## 2021-08-08 ENCOUNTER — Telehealth: Payer: Self-pay

## 2021-08-08 NOTE — Telephone Encounter (Signed)
Attempted phone call to pt.  Most recent BMET by PCP on 07/21/2021 shows improvement in renal function.  See lab for details. ?MyChart message sent to pt re: lab results. And attempted phone call. ?

## 2021-08-08 NOTE — Telephone Encounter (Signed)
-----   Message from [name], MD sent at 08/05/2021 11:44 AM EDT ----- ?Please Inform Patient that Cr has increased and needs to be rechecked ASAP   does she have PCP or nephrologist  ?Thanks ? ? ?

## 2021-08-12 ENCOUNTER — Ambulatory Visit: Payer: Medicare HMO | Admitting: Cardiovascular Disease

## 2021-08-12 ENCOUNTER — Encounter: Payer: Self-pay | Admitting: Cardiovascular Disease

## 2021-08-12 VITALS — BP 130/70 | HR 84 | Ht 70.0 in | Wt 221.6 lb

## 2021-08-12 DIAGNOSIS — N1832 Chronic kidney disease, stage 3b: Secondary | ICD-10-CM | POA: Diagnosis not present

## 2021-08-12 DIAGNOSIS — I5042 Chronic combined systolic (congestive) and diastolic (congestive) heart failure: Secondary | ICD-10-CM

## 2021-08-12 DIAGNOSIS — Z720 Tobacco use: Secondary | ICD-10-CM

## 2021-08-12 DIAGNOSIS — Z79899 Other long term (current) drug therapy: Secondary | ICD-10-CM | POA: Diagnosis not present

## 2021-08-12 DIAGNOSIS — I10 Essential (primary) hypertension: Secondary | ICD-10-CM | POA: Diagnosis not present

## 2021-08-12 MED ORDER — EMPAGLIFLOZIN 10 MG PO TABS: 10.0000 mg | ORAL_TABLET | Freq: Every day | ORAL | 3 refills | Status: DC

## 2021-08-12 NOTE — Progress Notes (Signed)
?Cardiology Office Note:   ? ?Date:  08/12/2021  ? ?ID:  Paula Stein, DOB 08-10-1945, MRN [id number] ? ?PCP:  [name], MD ?  ?[facility] Providers ?Cardiologist:  [name], MD ?Electrophysiologist:  Virl Axe, MD    ? ?Referring MD: [name], MD  ? ?Chief Complaint  ?Patient presents with  ? Shortness of Breath  ? ? ?History of Present Illness:   ? ?Paula Stein is a 76 y.o. female with a hx of congestive heart failure, presenting for follow-up evaluation. ? ?She has a history of chronic heart failure with reduced ejection fraction, LVEF approximately 35%.  She has undergone ICD implantation in the past.  Comorbid conditions include stage IIIb chronic kidney disease, hypertension, obesity, obstructive sleep apnea.  She has been demonstrated to have mild nonobstructive CAD in the past.  The patient is a longtime smoker. ? ?She was hospitalized July 06, 2021 with orthopnea, weight gain, abdominal distention, and breathlessness.  She was diagnosed with acute on chronic combined systolic and diastolic heart failure.  She had run out of furosemide and had not been taking it for over a month.  She was diuresed and managed medically with a good response.  Following her hospitalization, her creatinine increased to about 2.3, but returned to baseline with an increase in her fluid intake.  She actually is doing quite well at present.  She is here alone today for her follow-up visit.  Orthopnea and breathlessness have resolved.  She denies chest pain or pressure.  She does have some fatigue with activity.  No lightheadedness or presyncope.  She quit smoking at the time of her hospitalization.  She is compliant with her medications. ? ?Past Medical History:  ?Diagnosis Date  ? AICD (automatic cardioverter/defibrillator) present 10/08/2014  ? SUBQ    /    DR [name]  ? Cataract   ? CHF (congestive heart failure) ([place])   ? Diabetes mellitus without complication ([place])   ? GERD (gastroesophageal reflux  disease)   ? History of cardiac cath 05/05/2007  ? normal-with patent coronaries  ? History of colonoscopy   ? History of hiatal hernia   ? Hyperlipidemia   ? Hypertension   ? Non-ischemic cardiomyopathy ([place])   ? EF 28%- reassessment of LV function 2011 with LVEf 45-50%  ? PAD (peripheral artery disease) ([place])   ? lower extremities with ABIs of 0.5 bilaterally  ? Personal history of goiter   ? S/P radioactive iodine thyroid ablation   ? Sleep apnea   ? uses CPAP  ? ? ?Past Surgical History:  ?Procedure Laterality Date  ? BIOPSY  10/13/2020  ? Procedure: BIOPSY;  Surgeon: [name], DO;  Location: [facility];  Service: Gastroenterology;;  ? CATARACT EXTRACTION Bilateral   ? COLONOSCOPY WITH PROPOFOL N/A 10/13/2020  ? Procedure: COLONOSCOPY WITH PROPOFOL;  Surgeon: [name], DO;  Location: [facility];  Service: Gastroenterology;  Laterality: N/A;  ? EP IMPLANTABLE DEVICE N/A 10/08/2014  ? Procedure: SubQ ICD Implant;  Surgeon: [name], MD;  Location: [facility];  Service: Cardiovascular;  Laterality: N/A;  ? POLYPECTOMY  10/13/2020  ? Procedure: POLYPECTOMY;  Surgeon: [name], DO;  Location: [facility];  Service: Gastroenterology;;  ? SUBQ ICD CHANGEOUT N/A 11/14/2019  ? Procedure: SUBQ ICD CHANGEOUT;  Surgeon: [name], MD;  Location: [facility];  Service: Cardiovascular;  Laterality: N/A;  ? THYROIDECTOMY    ? TONSILLECTOMY    ?  TOTAL ABDOMINAL HYSTERECTOMY    ? ? ?Current Medications: ?Current Meds  ?Medication Sig  ? ACCU-CHEK GUIDE test strip USE TO CHECK BLOOD SUGAR DAILY  ? Accu-Chek Softclix Lancets lancets USE TO CHECK BLOOD SUGAR DAILY  ? allopurinol (ZYLOPRIM) 100 MG tablet Take 1 tablet (100 mg total) by mouth daily.  ? Blood Glucose Monitoring Suppl (ACCU-CHEK GUIDE) w/Device KIT 1 each by Does not apply route daily. Use to check blood sugar daily  ? carvedilol (COREG) 25 MG tablet Take 1 tablet (25 mg total) by mouth 2 (two) times daily with  a meal.  ? dicyclomine (BENTYL) 10 MG capsule Take 10 mg by mouth every 6 (six) hours as needed for cramping.  ? empagliflozin (JARDIANCE) 10 MG TABS tablet Take 1 tablet (10 mg total) by mouth daily before breakfast.  ? furosemide (LASIX) 40 MG tablet Take 1 tablet (40 mg total) by mouth 2 (two) times daily.  ? glipiZIDE (GLUCOTROL XL) 2.5 MG 24 hr tablet Take 1 tablet (2.5 mg total) by mouth daily with breakfast.  ? isosorbide mononitrate (IMDUR) 30 MG 24 hr tablet Take 1 tablet (30 mg total) by mouth daily.  ? levothyroxine (SYNTHROID) 125 MCG tablet TAKE 1 TABLET EVERY MORNING ON AN EMPTY STOMACH 30 MINUTES BEFORE TAKING OTHER MEDICATIONS OR BEFORE BREAKFAST  ? rivaroxaban (XARELTO) 20 MG TABS tablet Take 1 tablet (20 mg total) by mouth daily with supper.  ? sacubitril-valsartan (ENTRESTO) 97-103 MG Take 1 tablet by mouth 2 (two) times daily.  ? spironolactone (ALDACTONE) 50 MG tablet Take 1 tablet (50 mg total) by mouth daily.  ? traZODone (DESYREL) 50 MG tablet Take 0.5-1 tablets (25-50 mg total) by mouth at bedtime as needed for sleep.  ?  ? ?Allergies:   Ibuprofen  ? ?Social History  ? ?Socioeconomic History  ? Marital status: Divorced  ?  Spouse name: Not on file  ? Number of children: 1  ? Years of education: Not on file  ? Highest education level: Not on file  ?Occupational History  ? Occupation: Retired  ?  Employer: RETIRED  ?  Comment: School teacher  ?Tobacco Use  ? Smoking status: Light Smoker  ?  Packs/day: 0.25  ?  Years: 40.00  ?  Pack years: 10.00  ?  Types: Cigarettes  ? Smokeless tobacco: Never  ? Tobacco comments:  ?  on and off,currently a pack / week ( 10/05/20)  ?Vaping Use  ? Vaping Use: Never used  ?Substance and Sexual Activity  ? Alcohol use: Yes  ?  Alcohol/week: 0.0 standard drinks  ?  Comment: rare glass of wine  ? Drug use: No  ? Sexual activity: Not Currently  ?  Birth control/protection: None  ?Other Topics Concern  ? Not on file  ?Social History Narrative  ? Retired Education officer, museum   ? Divorced - one grown son  ? current smoker   ? Alcohol use-no     ? Drug use-no   ? Regular exercise- no   ?  son - Kaysha Parsell  ? ?Social Determinants of Health  ? ?Financial Resource Strain: Low Risk   ? Difficulty of Paying Living Expenses: Not very hard  ?Food Insecurity: Not on file  ?Transportation Needs: No Transportation Needs  ? Lack of Transportation (Medical): No  ? Lack of Transportation (Non-Medical): No  ?Physical Activity: Insufficiently Active  ? Days of Exercise per Week: 3 days  ? Minutes of Exercise per Session: 20 min  ?Stress: Not on  file  ?Social Connections: Moderately Integrated  ? Frequency of Communication with Friends and Family: Twice a week  ? Frequency of Social Gatherings with Friends and Family: Twice a week  ? Attends Religious Services: More than 4 times per year  ? Active Member of Clubs or Organizations: Yes  ? Attends Archivist Meetings: More than 4 times per year  ? Marital Status: Divorced  ?  ? ?Family History: ?The patient's family history includes Diabetes in her mother; Diabetes type II in her mother; Heart disease in her mother; Hyperlipidemia in her father; Hypertension in her mother; Other in her father. There is no history of Breast cancer, Stomach cancer, Colon cancer, Esophageal cancer, or Pancreatic cancer. ? ?ROS:   ?Please see the history of present illness.    ?Nocturnal leg cramps. All other systems reviewed and are negative. ? ?EKGs/Labs/Other Studies Reviewed:   ? ?The following studies were reviewed today: ?Echo 07/07/21: ?1. Left ventricular ejection fraction, by estimation, is 30 to 35%. The  ?left ventricle has moderately decreased function. The left ventricle  ?demonstrates global hypokinesis. There is mild left ventricular  ?hypertrophy. Left ventricular diastolic  ?parameters are consistent with Grade I diastolic dysfunction (impaired  ?relaxation). Elevated left ventricular end-diastolic pressure. The E/e' is  ?15.  ? 2. Right ventricular  systolic function is moderately reduced. The right  ?ventricular size is normal.  ? 3. Left atrial size was mildly dilated.  ? 4. The pericardial effusion is posterior to the left ventricle.  ? 5. The

## 2021-08-12 NOTE — Patient Instructions (Signed)
Medication Instructions:  ?START Jardiance '10mg'$  ?*If you need a refill on your cardiac medications before your next appointment, please call your pharmacy* ? ? ?Lab Work: ?BMET in 1 month ?If you have labs (blood work) drawn today and your tests are completely normal, you will receive your results only by: ?MyChart Message (if you have MyChart) OR ?A paper copy in the mail ?If you have any lab test that is abnormal or we need to change your treatment, we will call you to review the results. ? ? ?Testing/Procedures: ?NONE ? ? ?Follow-Up: ?At [facility], you and your health needs are our priority.  As part of our continuing mission to provide you with exceptional heart care, we have created designated Provider Care Teams.  These Care Teams include your primary Cardiologist (physician) and Advanced Practice Providers (APPs -  Physician Assistants and Nurse Practitioners) who all work together to provide you with the care you need, when you need it. ? ?Your next appointment:   ?3 month(s) ? ?The format for your next appointment:   ?In Person ? ?Provider:   ?Ronn Melena, or [place], then [place] in 1 year ? ?  ? ?Important Information About Sugar ? ? ? ? ?  ?

## 2021-08-15 ENCOUNTER — Telehealth: Payer: Self-pay | Admitting: Pharmacist

## 2021-08-15 NOTE — Progress Notes (Signed)
Remote ICD transmission.   

## 2021-08-15 NOTE — Progress Notes (Signed)
Patient placed call to advise her [facility]  provider on her Patient Assistance form needed to be corrected. Advised her I would correct and send to her via mail. She was in agreement. Per MP [facility] has been trying to reach patient concerning Co-pays / delivery of medications and have not been able to reach her. I returned call to patient to advise, did not reach left message asking patient to return call  to [facility] and included the contact number of the pharmacy.    [place] Clinical Pharmacist Assistant [phone]

## 2021-08-19 ENCOUNTER — Telehealth: Payer: Self-pay | Admitting: Pharmacist

## 2021-08-19 ENCOUNTER — Other Ambulatory Visit: Payer: Medicare HMO | Admitting: *Deleted

## 2021-08-19 DIAGNOSIS — Z79899 Other long term (current) drug therapy: Secondary | ICD-10-CM | POA: Diagnosis not present

## 2021-08-19 LAB — BASIC METABOLIC PANEL
BUN/Creatinine Ratio: 11 — ABNORMAL LOW (ref 12–28)
BUN: 16 mg/dL (ref 8–27)
CO2: 23 mmol/L (ref 20–29)
Calcium: 9.4 mg/dL (ref 8.7–10.3)
Chloride: 105 mmol/L (ref 96–106)
Creatinine, Ser: 1.4 mg/dL — ABNORMAL HIGH (ref 0.57–1.00)
Glucose: 112 mg/dL — ABNORMAL HIGH (ref 70–99)
Potassium: 4.6 mmol/L (ref 3.5–5.2)
Sodium: 140 mmol/L (ref 134–144)
eGFR: 39 mL/min/{1.73_m2} — ABNORMAL LOW (ref >59)

## 2021-08-19 NOTE — Progress Notes (Signed)
Chronic Care Management Pharmacy Assistant   Name: Paula Stein  MRN: [id number] DOB: 1945/10/30  Reason for Encounter: Disease State   Conditions to be addressed/monitored: CHF and HTN  Recent office visits:  None  Recent consult visits:  08/12/21 [name], MD (Cardiology) - Patient presented for CHF and other concerns. Prescribed Empagliflozin 10 mg daily.  07/29/21 Simone Curia, RN - [facility] visits:  Medication Reconciliation was completed by comparing discharge summary, patient's EMR and Pharmacy list, and upon discussion with patient.   Patient presented to [facility] on 07/06/21 due to Acute on chronic systolic Congestive Heart Failure. Patient was present for 4 days.   New?Medications Started at [facility] Discharge:?? -started  polyethylene glycol (MIRALAX / GLYCOLAX) sacubitril-valsartan (ENTRESTO   Medication Changes at Hospital Discharge: -Changed  None   Medications Discontinued at Hospital Discharge: -Stopped  Entresto 49-51 MG (sacubitril-valsartan)   Medications that remain the same after Hospital Discharge:??  -All other medications will remain the same.      Medications: Outpatient Encounter Medications as of 08/15/2021  Medication Sig   ACCU-CHEK GUIDE test strip USE TO CHECK BLOOD SUGAR DAILY   Accu-Chek Softclix Lancets lancets USE TO CHECK BLOOD SUGAR DAILY   allopurinol (ZYLOPRIM) 100 MG tablet Take 1 tablet (100 mg total) by mouth daily.   Blood Glucose Monitoring Suppl (ACCU-CHEK GUIDE) w/Device KIT 1 each by Does not apply route daily. Use to check blood sugar daily   carvedilol (COREG) 25 MG tablet Take 1 tablet (25 mg total) by mouth 2 (two) times daily with a meal.   dicyclomine (BENTYL) 10 MG capsule Take 10 mg by mouth every 6 (six) hours as needed for cramping.   empagliflozin (JARDIANCE) 10 MG TABS tablet Take 1 tablet (10 mg total) by mouth daily before breakfast.   furosemide  (LASIX) 40 MG tablet Take 1 tablet (40 mg total) by mouth 2 (two) times daily.   glipiZIDE (GLUCOTROL XL) 2.5 MG 24 hr tablet Take 1 tablet (2.5 mg total) by mouth daily with breakfast.   isosorbide mononitrate (IMDUR) 30 MG 24 hr tablet Take 1 tablet (30 mg total) by mouth daily.   levothyroxine (SYNTHROID) 125 MCG tablet TAKE 1 TABLET EVERY MORNING ON AN EMPTY STOMACH 30 MINUTES BEFORE TAKING OTHER MEDICATIONS OR BEFORE BREAKFAST   rivaroxaban (XARELTO) 20 MG TABS tablet Take 1 tablet (20 mg total) by mouth daily with supper.   sacubitril-valsartan (ENTRESTO) 97-103 MG Take 1 tablet by mouth 2 (two) times daily.   spironolactone (ALDACTONE) 50 MG tablet Take 1 tablet (50 mg total) by mouth daily.   traZODone (DESYREL) 50 MG tablet Take 0.5-1 tablets (25-50 mg total) by mouth at bedtime as needed for sleep.   No facility-administered encounter medications on file as of 08/15/2021.  Reviewed chart prior to disease state call. Spoke with patient regarding BP  Recent Office Vitals: BP Readings from Last 3 Encounters:  08/12/21 130/70  07/21/21 120/68  07/10/21 119/77   Pulse Readings from Last 3 Encounters:  08/12/21 84  07/21/21 72  07/10/21 75    Wt Readings from Last 3 Encounters:  08/12/21 221 lb 9.6 oz (100.5 kg)  07/21/21 220 lb 12.8 oz (100.2 kg)  07/10/21 226 lb 13.7 oz (102.9 kg)     Kidney Function Lab Results  Component Value Date/Time   CREATININE 1.68 (H) 07/21/2021 11:15 AM   CREATININE 2.40 (H) 07/14/2021 01:11 PM   CREATININE 1.46 (H) 03/31/2020  09:19 AM   CREATININE 0.99 (H) 09/26/2017 12:00 AM   GFR 29.07 (L) 07/21/2021 11:15 AM   GFRNONAA 28 (L) 07/10/2021 03:32 AM   GFRNONAA 35 (L) 03/31/2020 09:19 AM   GFRAA 40 (L) 03/31/2020 09:19 AM       Latest Ref Rng & Units 07/21/2021   11:15 AM 07/14/2021    1:11 PM 07/10/2021    3:32 AM  BMP  Glucose 70 - 99 mg/dL 121    127    BUN 6 - 23 mg/dL 31    38    Creatinine 0.40 - 1.20 mg/dL 1.68   2.40   1.86     Sodium 135 - 145 mEq/L 140    139    Potassium 3.5 - 5.1 mEq/L 4.4    4.3    Chloride 96 - 112 mEq/L 104    101    CO2 19 - 32 mEq/L 28    29    Calcium 8.4 - 10.5 mg/dL 9.7    9.1      Current antihypertensive regimen:  Entresto 97-103 mg 1 tablet twice daily - Appropriate, Query effective, Safe, Accessible Spironolactone 50 mg 1 tablet daily - Appropriate, Query effective, Safe, Accessible Carvedilol 25 mg 1 tablet twice daily - Appropriate, Query effective, Safe, Accessible  Unable to reach for call  Care Gaps: COVID Booster - Overdue Foot exam - Overdue Eye Exam - Overdue BP- 130/70 ( 08/12/21) AWV- 7/22 CCM- 7/23 Lab Results  Component Value Date   HGBA1C 6.9 (H) 07/21/2021    Star Rating Drugs: Glipizide 2.5 mg - Last filled 07/27/21 30 DS at [facility] Empagliflozin ( Jardiance) 10 mg - Last filled 08/12/21 30 DS at [facility] Pharmacist Assistant [phone]

## 2021-08-31 ENCOUNTER — Telehealth: Payer: Self-pay | Admitting: Gastroenterology

## 2021-08-31 NOTE — Telephone Encounter (Signed)
Called the patient back and LVM to schedule an office visit to discuss colonoscopy with Dr. Bryan Lemma. Need to be seen by the provider to  first since she is on Xarelto

## 2021-09-05 NOTE — Telephone Encounter (Signed)
LVM to call back to  schedule an office visit with Dr. Adrian Prince

## 2021-09-07 ENCOUNTER — Ambulatory Visit: Payer: Medicare HMO | Admitting: Family Medicine

## 2021-09-09 ENCOUNTER — Encounter: Payer: Self-pay | Admitting: Cardiovascular Disease

## 2021-09-09 ENCOUNTER — Ambulatory Visit (INDEPENDENT_AMBULATORY_CARE_PROVIDER_SITE_OTHER): Payer: Medicare HMO | Admitting: Family Medicine

## 2021-09-09 VITALS — BP 130/78 | HR 70 | Temp 98.0°F | Wt 222.0 lb

## 2021-09-09 DIAGNOSIS — L299 Pruritus, unspecified: Secondary | ICD-10-CM

## 2021-09-09 MED ORDER — DAPAGLIFLOZIN PROPANEDIOL 10 MG PO TABS: 10.0000 mg | ORAL_TABLET | Freq: Every day | ORAL | 3 refills | Status: DC

## 2021-09-09 MED ORDER — HYDROXYZINE PAMOATE 25 MG PO CAPS: 25.0000 mg | ORAL_CAPSULE | Freq: Four times a day (QID) | ORAL | 0 refills | Status: DC | PRN

## 2021-09-09 NOTE — Telephone Encounter (Signed)
Yes OK to switch to [place]. I'm not sure what the chances of another reaction might be, but I think worth a try to get the CV benefit from the medication. thanks

## 2021-09-09 NOTE — Telephone Encounter (Signed)
Pt seen in office on 08/12/21 and started on Jardiance '10mg'$ , but has developed pretty immense itching all over her body. She has seen her PCP and they attribute it to the medication. Pt now requesting something else. Will route to MD for review.  The patient is clinically improved and feeling well after hospitalization for heart failure.  She reports NYHA functional class II symptoms.  She is on a good medical program with carvedilol, isosorbide, Entresto, and spironolactone.  I reviewed her labs which demonstrate stage IIIb chronic kidney disease most recent creatinine 1.68.  I have recommended the addition of Jardiance 10 mg daily.  We will repeat a metabolic panel in 1 month.  I would like her to have an APP visit in 3 months.  She is given instructions about the use of Jardiance, making sure she has adequate fluid intake, and keeping an eye out for any GU infection.

## 2021-09-09 NOTE — Addendum Note (Signed)
Addended by: Ma Hillock on: 09/09/2021 05:16 PM   Modules accepted: Orders

## 2021-09-09 NOTE — Progress Notes (Signed)
   Subjective:    Patient ID: Paula Stein, female    DOB: [birth year], 76 y.o.   MRN: [id number]  HPI Here for 5 days of intense itching all over her body. No visible rashes. When I ask about recent changes to her routine, she says Dr. Burt Knack started her on Jardiance 2 weeks ago to help her heart. She has tried applying Benadryl cream and Calamine to no avail.    Review of Systems  Constitutional: Negative.   Respiratory: Negative.    Cardiovascular: Negative.   Skin:  Negative for color change and rash.       Objective:   Physical Exam Constitutional:      General: She is not in acute distress.    Appearance: Normal appearance.  Cardiovascular:     Rate and Rhythm: Normal rate and regular rhythm.     Pulses: Normal pulses.     Heart sounds: Normal heart sounds.  Pulmonary:     Effort: Pulmonary effort is normal.     Breath sounds: Normal breath sounds.  Skin:    Findings: No erythema or rash.  Neurological:     Mental Status: She is alert.           Assessment & Plan:  The itching is likely a side effect of the Jardiance, so she will stop this immediately. She can use Hydroxyzine as needed to help the itching. I explained that this should resolve over the next week or so.  Alysia Penna, MD

## 2021-09-15 ENCOUNTER — Ambulatory Visit: Payer: Medicare HMO | Admitting: Family Medicine

## 2021-09-21 ENCOUNTER — Telehealth: Payer: Self-pay | Admitting: Pharmacist

## 2021-09-21 NOTE — Chronic Care Management (AMB) (Addendum)
Chronic Care Management Pharmacy Assistant   Name: Paula Stein  MRN: [id number] DOB: 08-17-1945  Reason for Encounter: Disease State/ Medication Coordination   Conditions to be addressed/monitored: HTN & CHF  Recent office visits:  09/09/21 [name], MD - Patient presented for Itching. Prescribed Hydroxyzine Pamoate 25 mg. Stopped Empagliflozin 10 mg.  Recent consult visits:  08/12/21 [name], MD (Cardiology) - Patient presented for CHF and other concerns. Prescribed Empagliflozin 10 mg daily.   07/29/21 Simone Curia, RN - Remote [place] [facility] visits:  Medication Reconciliation was completed by comparing discharge summary, patient's EMR and Pharmacy list, and upon discussion with patient.   Patient presented to [facility] on 07/06/21 due to Acute on chronic systolic Congestive Heart Failure. Patient was present for 4 days.   New?Medications Started at [facility] Discharge:?? -started  polyethylene glycol (MIRALAX / GLYCOLAX) sacubitril-valsartan (ENTRESTO   Medication Changes at Hospital Discharge: -Changed  None  Medications Discontinued at Hospital Discharge: -Stopped  Entresto 49-51 MG (sacubitril-valsartan)   Medications that remain the same after Hospital Discharge:??  -All other medications will remain the same.    Medications: Outpatient Encounter Medications as of 09/21/2021  Medication Sig   ACCU-CHEK GUIDE test strip USE TO CHECK BLOOD SUGAR DAILY   Accu-Chek Softclix Lancets lancets USE TO CHECK BLOOD SUGAR DAILY   allopurinol (ZYLOPRIM) 100 MG tablet Take 1 tablet (100 mg total) by mouth daily.   Blood Glucose Monitoring Suppl (ACCU-CHEK GUIDE) w/Device KIT 1 each by Does not apply route daily. Use to check blood sugar daily   carvedilol (COREG) 25 MG tablet Take 1 tablet (25 mg total) by mouth 2 (two) times daily with a meal.   dapagliflozin propanediol (FARXIGA) 10 MG TABS tablet Take 1 tablet (10 mg total) by  mouth daily before breakfast.   dicyclomine (BENTYL) 10 MG capsule Take 10 mg by mouth every 6 (six) hours as needed for cramping.   furosemide (LASIX) 40 MG tablet Take 1 tablet (40 mg total) by mouth 2 (two) times daily.   glipiZIDE (GLUCOTROL XL) 2.5 MG 24 hr tablet Take 1 tablet (2.5 mg total) by mouth daily with breakfast.   hydrOXYzine (VISTARIL) 25 MG capsule Take 1 capsule (25 mg total) by mouth every 6 (six) hours as needed for itching.   isosorbide mononitrate (IMDUR) 30 MG 24 hr tablet Take 1 tablet (30 mg total) by mouth daily.   levothyroxine (SYNTHROID) 125 MCG tablet TAKE 1 TABLET EVERY MORNING ON AN EMPTY STOMACH 30 MINUTES BEFORE TAKING OTHER MEDICATIONS OR BEFORE BREAKFAST   rivaroxaban (XARELTO) 20 MG TABS tablet Take 1 tablet (20 mg total) by mouth daily with supper.   sacubitril-valsartan (ENTRESTO) 97-103 MG Take 1 tablet by mouth 2 (two) times daily.   spironolactone (ALDACTONE) 50 MG tablet Take 1 tablet (50 mg total) by mouth daily.   traZODone (DESYREL) 50 MG tablet Take 0.5-1 tablets (25-50 mg total) by mouth at bedtime as needed for sleep.   No facility-administered encounter medications on file as of 09/21/2021.  Reviewed chart prior to disease state call. Spoke with patient regarding BP  Recent Office Vitals: BP Readings from Last 3 Encounters:  09/09/21 130/78  08/12/21 130/70  07/21/21 120/68   Pulse Readings from Last 3 Encounters:  09/09/21 70  08/12/21 84  07/21/21 72    Wt Readings from Last 3 Encounters:  09/09/21 222 lb (100.7 kg)  08/12/21 221 lb 9.6 oz (100.5 kg)  07/21/21  220 lb 12.8 oz (100.2 kg)     Patient obtains medications through Adherence Packaging  30 Days   Patient is due for 1st  adherence delivery on: 10/11/21. Called patient and reviewed medications and coordinated delivery. Packs 30 Ds  This delivery to include: Isosorbide Mono (IMDUR) 30 mg: Take one tab daily at Dinner Furosemide (Lasix) 40 mg: Take one tab at Breakfast  and one at Dinner Allopurinol ( Zyloprim) 100 mg : Take one tab at Breakfast Carvedilol (Coreg) 25 mg: Take one tab at Breakfast and one at [facility] 97-103 mg: Take one tab at Breakfast and one at [place] 20 mg : Take one tab at Dinner Levothyroxine (Synthroid) 125 mcg: Take one tab Before Breakfast Spironolactone 50 mg: Take one tab at Dinner Glipizide 2.5 mg: Take one tab at Breakfast Farxiga 10 mg: Take one tab Before Breakfast      Unable to reach to confirm delivery date of 10/11/21, advised patient that pharmacy will contact them the morning of delivery.    Care Gaps: COVID Booster - Overdue Foot Exam - Overdue Eye Exam - Overdue CCM- 7/23 AWV- 7/22  Star Rating Drugs: Glipizide 2.5 mg - Last filled 07/27/21 30 DS at [facility] Empagliflozin ( Jardiance) 10 mg - Last filled 08/12/21 30 DS at [facility] Pharmacist Assistant [phone]

## 2021-09-21 NOTE — Chronic Care Management (AMB) (Incomplete Revision)
  Chronic Care Management Pharmacy Assistant   Name: Paula Stein  MRN: [id number] DOB: 10/28/1945  Reason for Encounter: Disease State/ Medication Coordination   Conditions to be addressed/monitored: HTN & CHF  Recent office visits:  09/09/21 Fry, Stephen A, MD - Patient presented for Itching. Prescribed Hydroxyzine Pamoate 25 mg. Stopped Empagliflozin 10 mg.  Recent consult visits:  08/12/21 Cooper, Michael, MD (Cardiology) - Patient presented for CHF and other concerns. Prescribed Empagliflozin 10 mg daily.   07/29/21 Wallace, Elizabeth T, RN - Remote Paceart Device Check  Hospital visits:  Medication Reconciliation was completed by comparing discharge summary, patient's EMR and Pharmacy list, and upon discussion with patient.   Patient presented to [facility] on 07/06/21 due to Acute on chronic systolic Congestive Heart Failure. Patient was present for 4 days.   New?Medications Started at Hospital Discharge:?? -started  polyethylene glycol (MIRALAX / GLYCOLAX) sacubitril-valsartan (ENTRESTO   Medication Changes at Hospital Discharge: -Changed  None  Medications Discontinued at Hospital Discharge: -Stopped  Entresto 49-51 MG (sacubitril-valsartan)   Medications that remain the same after Hospital Discharge:??  -All other medications will remain the same.    Medications: Outpatient Encounter Medications as of 09/21/2021  Medication Sig   ACCU-CHEK GUIDE test strip USE TO CHECK BLOOD SUGAR DAILY   Accu-Chek Softclix Lancets lancets USE TO CHECK BLOOD SUGAR DAILY   allopurinol (ZYLOPRIM) 100 MG tablet Take 1 tablet (100 mg total) by mouth daily.   Blood Glucose Monitoring Suppl (ACCU-CHEK GUIDE) w/Device KIT 1 each by Does not apply route daily. Use to check blood sugar daily   carvedilol (COREG) 25 MG tablet Take 1 tablet (25 mg total) by mouth 2 (two) times daily with a meal.   dapagliflozin propanediol (FARXIGA) 10 MG TABS tablet Take 1 tablet (10 mg total) by  mouth daily before breakfast.   dicyclomine (BENTYL) 10 MG capsule Take 10 mg by mouth every 6 (six) hours as needed for cramping.   furosemide (LASIX) 40 MG tablet Take 1 tablet (40 mg total) by mouth 2 (two) times daily.   glipiZIDE (GLUCOTROL XL) 2.5 MG 24 hr tablet Take 1 tablet (2.5 mg total) by mouth daily with breakfast.   hydrOXYzine (VISTARIL) 25 MG capsule Take 1 capsule (25 mg total) by mouth every 6 (six) hours as needed for itching.   isosorbide mononitrate (IMDUR) 30 MG 24 hr tablet Take 1 tablet (30 mg total) by mouth daily.   levothyroxine (SYNTHROID) 125 MCG tablet TAKE 1 TABLET EVERY MORNING ON AN EMPTY STOMACH 30 MINUTES BEFORE TAKING OTHER MEDICATIONS OR BEFORE BREAKFAST   rivaroxaban (XARELTO) 20 MG TABS tablet Take 1 tablet (20 mg total) by mouth daily with supper.   sacubitril-valsartan (ENTRESTO) 97-103 MG Take 1 tablet by mouth 2 (two) times daily.   spironolactone (ALDACTONE) 50 MG tablet Take 1 tablet (50 mg total) by mouth daily.   traZODone (DESYREL) 50 MG tablet Take 0.5-1 tablets (25-50 mg total) by mouth at bedtime as needed for sleep.   No facility-administered encounter medications on file as of 09/21/2021.  Reviewed chart prior to disease state call. Spoke with patient regarding BP  Recent Office Vitals: BP Readings from Last 3 Encounters:  09/09/21 130/78  08/12/21 130/70  07/21/21 120/68   Pulse Readings from Last 3 Encounters:  09/09/21 70  08/12/21 84  07/21/21 72    Wt Readings from Last 3 Encounters:  09/09/21 222 lb (100.7 kg)  08/12/21 221 lb 9.6 oz (100.5 kg)  07/21/21   220 lb 12.8 oz (100.2 kg)     Kidney Function Lab Results  Component Value Date/Time   CREATININE 1.40 (H) 08/19/2021 09:40 AM   CREATININE 1.68 (H) 07/21/2021 11:15 AM   CREATININE 1.46 (H) 03/31/2020 09:19 AM   CREATININE 0.99 (H) 09/26/2017 12:00 AM   GFR 29.07 (L) 07/21/2021 11:15 AM   GFRNONAA 28 (L) 07/10/2021 03:32 AM   GFRNONAA 35 (L) 03/31/2020 09:19 AM    GFRAA 40 (L) 03/31/2020 09:19 AM       Latest Ref Rng & Units 08/19/2021    9:40 AM 07/21/2021   11:15 AM 07/14/2021    1:11 PM  BMP  Glucose 70 - 99 mg/dL 112  121    BUN 8 - 27 mg/dL 16  31    Creatinine 0.57 - 1.00 mg/dL 1.40  1.68  2.40   BUN/Creat Ratio 12 - 28 11     Sodium 134 - 144 mmol/L 140  140    Potassium 3.5 - 5.2 mmol/L 4.6  4.4    Chloride 96 - 106 mmol/L 105  104    CO2 20 - 29 mmol/L 23  28    Calcium 8.7 - 10.3 mg/dL 9.4  9.7      Current antihypertensive regimen:  Entresto 97-103 mg 1 tablet twice daily - Appropriate, Query effective, Safe, Accessible Spironolactone 50 mg 1 tablet daily - Appropriate, Query effective, Safe, Accessible Carvedilol 25 mg 1 tablet twice daily - Appropriate, Query effective, Safe, Accessible How often are you checking your Blood Pressure?     Current COPD regimen:  Entresto 97-103 mg 1 tablet twice daily - Appropriate, Effective, Safe, Query accessible Spironolactone 50 mg 1 tablet daily - Appropriate, Effective, Safe, Accessible Furosemide 40 mg 1 tablet twice daily (not taking consistently) - Appropriate, Query effective, Safe, Accessible Isosorbide mononitrate 30 mg 1 tablet daily - Appropriate, Effective, Safe, Accessible Carvedilol 25 mg 1 tablet twice daily - Appropriate, Effective, Safe, Accessible    BP Readings from Last 3 Encounters:  09/09/21 130/78  08/12/21 130/70  07/21/21 120/68    Lab Results  Component Value Date   HGBA1C 6.9 (H) 07/21/2021     Patient obtains medications through Adherence Packaging  30 Days   Patient is due for 1st  adherence delivery on: 10/11/21. Called patient and reviewed medications and coordinated delivery. Packs 30 Ds  This delivery to include: Isosorbide Mono (IMDUR) 30 mg: Take one tab daily at Dinner Furosemide (Lasix) 40 mg: Take one tab at Breakfast and one at Dinner Allopurinol ( Zyloprim) 100 mg : Take one tab at Breakfast Carvedilol (Coreg) 25 mg: Take one tab at  Breakfast and one at [employer] 97-103 mg: Take one tab at Breakfast and one at [place] 20 mg : Take one tab at Dinner Levothyroxine (Synthroid) 125 mcg: Take one tab Before Breakfast Spironolactone 50 mg: Take one tab at Dinner Glipizide 2.5 mg: Take one tab at Breakfast Farxiga 10 mg: Take one tab Before Breakfast  Teams*   Patient will need a short fill of (med), prior to adherence delivery. (To align with sync date or if PRN med)  Coordinated acute fill for (med) to be delivered (date).  Patient declined the following medications (meds) due to (reason)  Patient needs refills for ***.  Confirmed delivery date of 10/11/21, advised patient that pharmacy will contact them the morning of delivery.    Care Gaps: COVID Booster - Overdue Foot Exam - Overdue Eye Exam - Overdue CCM-  7/23 AWV- 7/22  Star Rating Drugs: Glipizide 2.5 mg - Last filled 07/27/21 30 DS at [facility] Empagliflozin ( Jardiance) 10 mg - Last filled 08/12/21 30 DS at [facility] Pharmacist Assistant [phone]

## 2021-09-27 ENCOUNTER — Encounter: Payer: Self-pay | Admitting: Family Medicine

## 2021-09-27 DIAGNOSIS — N390 Urinary tract infection, site not specified: Secondary | ICD-10-CM | POA: Diagnosis not present

## 2021-09-27 DIAGNOSIS — I129 Hypertensive chronic kidney disease with stage 1 through stage 4 chronic kidney disease, or unspecified chronic kidney disease: Secondary | ICD-10-CM | POA: Diagnosis not present

## 2021-09-27 DIAGNOSIS — E1122 Type 2 diabetes mellitus with diabetic chronic kidney disease: Secondary | ICD-10-CM | POA: Diagnosis not present

## 2021-09-27 DIAGNOSIS — N1832 Chronic kidney disease, stage 3b: Secondary | ICD-10-CM | POA: Diagnosis not present

## 2021-09-27 LAB — BASIC METABOLIC PANEL
BUN: 20 (ref 4–21)
CO2: 27 — AB (ref 13–22)
Chloride: 108 (ref 99–108)
Potassium: 4.3 mEq/L (ref 3.5–5.1)
Sodium: 141 (ref 137–147)

## 2021-09-27 LAB — CBC AND DIFFERENTIAL: Hemoglobin: 12.4 (ref 12.0–16.0)

## 2021-09-27 LAB — COMPREHENSIVE METABOLIC PANEL
Albumin: 3.6 (ref 3.5–5.0)
Calcium: 9.2 (ref 8.7–10.7)

## 2021-09-28 ENCOUNTER — Telehealth: Payer: Self-pay | Admitting: Pharmacist

## 2021-09-28 NOTE — Progress Notes (Signed)
A user error has taken place: encounter opened in error, closed for administrative reasons.

## 2021-09-30 ENCOUNTER — Other Ambulatory Visit: Payer: Self-pay | Admitting: Family Medicine

## 2021-09-30 MED ORDER — ACCU-CHEK GUIDE W/DEVICE KIT: 1.0000 | PACK | Freq: Every day | 0 refills | Status: DC

## 2021-10-05 ENCOUNTER — Telehealth: Payer: Self-pay | Admitting: Gastroenterology

## 2021-10-05 ENCOUNTER — Ambulatory Visit: Payer: Medicare HMO | Admitting: Gastroenterology

## 2021-10-05 ENCOUNTER — Telehealth: Payer: Self-pay

## 2021-10-05 ENCOUNTER — Encounter: Payer: Self-pay | Admitting: Gastroenterology

## 2021-10-05 VITALS — BP 148/76 | HR 84 | Ht 69.0 in | Wt 225.0 lb

## 2021-10-05 DIAGNOSIS — I509 Heart failure, unspecified: Secondary | ICD-10-CM | POA: Diagnosis not present

## 2021-10-05 DIAGNOSIS — Z86010 Personal history of colonic polyps: Secondary | ICD-10-CM

## 2021-10-05 DIAGNOSIS — Z7901 Long term (current) use of anticoagulants: Secondary | ICD-10-CM | POA: Diagnosis not present

## 2021-10-05 MED ORDER — NA SULFATE-K SULFATE-MG SULF 17.5-3.13-1.6 GM/177ML PO SOLN: 1.0000 | Freq: Once | ORAL | 0 refills | Status: AC

## 2021-10-05 MED ORDER — NA SULFATE-K SULFATE-MG SULF 17.5-3.13-1.6 GM/177ML PO SOLN: 1.0000 | Freq: Once | ORAL | 0 refills | Status: DC

## 2021-10-05 NOTE — Telephone Encounter (Signed)
[facility] Pre-operative Risk Assessment     Request for surgical clearance:     Endoscopy Procedure  What type of surgery is being performed?     colonoscopy  When is this surgery scheduled?     11/10/21  What type of clearance is required ? Medical  Are there any medications that need to be held prior to surgery and how long? Xarelto -sent to Dr. Lorenso Courier  Practice name and name of physician performing surgery?  Dr. Bryan Lemma,  [facility]  What is your office phone and fax number?      Phone- [phone]  [phone]  Anesthesia type (None, local, MAC, general) ?       MAC

## 2021-10-05 NOTE — Telephone Encounter (Signed)
Inbound call from patients pharmacy stating that her insurance does not cover the prep medication that was sent in for her. Pharmacy is requesting an alternative to be sent. Please advise.

## 2021-10-05 NOTE — Telephone Encounter (Signed)
Dr Lorenso Courier please advise if this patient can hold her Xarelto 2 days prior to her upcoming colonoscopy on 11-10-2021 with Dr Bryan Lemma at [facility] Pre-operative Risk Assessment     Request for surgical clearance:     Endoscopy Procedure  What type of surgery is being performed?     Colonoscopy  When is this surgery scheduled?     11-10-2021  What type of clearance is required ?   Pharmacy  Are there any medications that need to be held prior to surgery and how long? Xarelto 2 day hold  Practice name and name of physician performing surgery?      [facility] Gerrit Heck, MD  What is your office phone and fax number?      Phone- [phone]  [phone]  Anesthesia type (None, local, MAC, general) ?       MAC

## 2021-10-05 NOTE — Patient Instructions (Addendum)
If you are age 78 or older, your body mass index should be between 23-30. Your Body mass index is 33.23 kg/m. If this is out of the aforementioned range listed, please consider follow up with your Primary Care Provider.  If you are age 78 or younger, your body mass index should be between 19-25. Your Body mass index is 33.23 kg/m. If this is out of the aformentioned range listed, please consider follow up with your Primary Care Provider.   ________________________________________________________  The [place] [facility] providers would like to encourage you to use [facility] to communicate with providers for non-urgent requests or questions.  Due to long hold times on the telephone, sending your provider a message by [facility] may be a faster and more efficient way to get a response.  Please allow 48 business hours for a response.  Please remember that this is for non-urgent requests.  _______________________________________________________  Paula Stein have been scheduled for a colonoscopy. Please follow written instructions given to you at your visit today.  Please pick up your prep supplies at the pharmacy within the next 1-3 days. If you use inhalers (even only as needed), please bring them with you on the day of your procedure.  You will be contacted by our office prior to your procedure for directions on holding your blood thinner.  If you do not hear from our office 2 week prior to your scheduled procedure, please call [phone] to discuss.    It was a pleasure to see you today!  Vito [name], D.O.

## 2021-10-05 NOTE — Progress Notes (Signed)
Chief Complaint:    Colon polyp surveillance  GI History: 76 year old female with a past medical history of AICD placement due to CHF (last echo 06/04/2018 with an ejection fraction of 30 to 35%), PAD, CKD 3, diabetes, GERD, HTN, OSA (uses CPAP), hyperlipidemia, and history of diverticulitis, with last episode 05/6376 complicated by right portal vein thrombus, treated with heparin gtt. then transition to Xarelto with plan to remain on Xarelto long-term.  Endoscopic History: - Colonoscopy (08/22/2004, [facility]): No report available for review - Colonoscopy (10/2020): 6 subcentimeter polyps in the descending, ascending, cecum (path: Tubular adenomas x2, SSP x1).  Dense sigmoid diverticulosis with associated mucosal hypertrophy and erythema (path: Benign).  Many small rectal and sigmoid hyperplastic polyps.  15 mm rectal polyp (path: Tubular adenoma).  Extensive colonic looping.  2 medium sized AVMs in the ascending colon and cecum.  Recommended repeat in 1 year   HPI:     Patient is a 76 y.o. female presenting to the Gastroenterology Clinic to discuss colonoscopy for continued colon polyp surveillance.   She is otherwise without any GI symptoms.  No hematochezia, melena, abdominal pain, change in bowel habits.  Good p.o. intake without any upper GI symptoms.  Review of systems:     No chest pain, no SOB, no fevers, no urinary sx   Past Medical History:  Diagnosis Date   AICD (automatic cardioverter/defibrillator) present 10/08/2014   SUBQ    /    DR [name]   Cataract    CHF (congestive heart failure) (HCC)    Diabetes mellitus without complication (HCC)    GERD (gastroesophageal reflux disease)    History of cardiac cath 05/05/2007   normal-with patent coronaries   History of colonoscopy    History of hiatal hernia    Hyperlipidemia    Hypertension    Kidney failure    Stage 3   Non-ischemic cardiomyopathy (HCC)    EF 28%- reassessment of LV function 2011 with LVEf 45-50%   PAD  (peripheral artery disease) (HCC)    lower extremities with ABIs of 0.5 bilaterally   Personal history of goiter    S/P radioactive iodine thyroid ablation    Sleep apnea    uses CPAP    Patient's surgical history, family medical history, social history, medications and allergies were all reviewed in Epic    Current Outpatient Medications  Medication Sig Dispense Refill   ACCU-CHEK GUIDE test strip USE TO CHECK BLOOD SUGAR DAILY 100 strip 1   Accu-Chek Softclix Lancets lancets USE TO CHECK BLOOD SUGAR DAILY 100 each 1   allopurinol (ZYLOPRIM) 100 MG tablet Take 1 tablet (100 mg total) by mouth daily. 90 tablet 1   Blood Glucose Monitoring Suppl (ACCU-CHEK GUIDE) w/Device KIT 1 each by Does not apply route daily. Use to check blood sugar daily 1 kit 0   carvedilol (COREG) 25 MG tablet Take 1 tablet (25 mg total) by mouth 2 (two) times daily with a meal. 180 tablet 3   dapagliflozin propanediol (FARXIGA) 10 MG TABS tablet Take 1 tablet (10 mg total) by mouth daily before breakfast. 90 tablet 3   dicyclomine (BENTYL) 10 MG capsule Take 10 mg by mouth every 6 (six) hours as needed for cramping.     furosemide (LASIX) 40 MG tablet Take 1 tablet (40 mg total) by mouth 2 (two) times daily. 180 tablet 3   glipiZIDE (GLUCOTROL XL) 2.5 MG 24 hr tablet Take 1 tablet (2.5 mg total) by mouth daily with  breakfast. 90 tablet 1   hydrOXYzine (VISTARIL) 25 MG capsule Take 1 capsule (25 mg total) by mouth every 6 (six) hours as needed for itching. 30 capsule 0   isosorbide mononitrate (IMDUR) 30 MG 24 hr tablet Take 1 tablet (30 mg total) by mouth daily. 90 tablet 3   levothyroxine (SYNTHROID) 125 MCG tablet TAKE 1 TABLET EVERY MORNING ON AN EMPTY STOMACH 30 MINUTES BEFORE TAKING OTHER MEDICATIONS OR BEFORE BREAKFAST 90 tablet 1   rivaroxaban (XARELTO) 20 MG TABS tablet Take 1 tablet (20 mg total) by mouth daily with supper. 90 tablet 1   sacubitril-valsartan (ENTRESTO) 97-103 MG Take 1 tablet by mouth 2  (two) times daily. 180 tablet 3   spironolactone (ALDACTONE) 50 MG tablet Take 1 tablet (50 mg total) by mouth daily. 90 tablet 3   traZODone (DESYREL) 50 MG tablet Take 0.5-1 tablets (25-50 mg total) by mouth at bedtime as needed for sleep. 30 tablet 1   No current facility-administered medications for this visit.    Physical Exam:     BP (!) 148/76   Pulse 84   Ht 5' 9"  (1.753 m)   Wt 225 lb (102.1 kg)   BMI 33.23 kg/m   GENERAL:  Pleasant female in NAD PSYCH: : Cooperative, normal affect EENT:  conjunctiva pink, mucous membranes moist, neck supple without masses CARDIAC:  RRR, no murmur heard, no peripheral edema PULM: Normal respiratory effort, lungs CTA bilaterally, no wheezing ABDOMEN:  Nondistended, soft, nontender. No obvious masses, no hepatomegaly,  normal bowel sounds SKIN:  turgor, no lesions seen Musculoskeletal:  Normal muscle tone, normal strength NEURO: Alert and oriented x 3, no focal neurologic deficits   IMPRESSION and PLAN:    1) History of colon polyps - Due for repeat colonoscopy for short interval polyp surveillance - Schedule colonoscopy to be done at [facility] due to elevated periprocedural risks from underlying comorbidities  2) History of portal vein thrombus 3) Chronic anticoagulation -Hold Xarelto 2 days before procedure - will instruct when and how to resume after procedure. Low but real risk of cardiovascular event such as heart attack, stroke, embolism, thrombosis or ischemia/infarct of other organs off Xarelto explained and need to seek urgent help if this occurs. The patient consents to proceed. Will communicate by phone or EMR with patient's prescribing provider (Dr. Lorenso Courier) to confirm that holding Xarelto is reasonable in this case  4) CHF with reduced EF - Will send message to Cardiologist requesting Cardiology clearance to proceed with colonoscopy at [facility] - As above, procedures to be scheduled at [facility] due to elevated  periprocedural risks    The indications, risks, and benefits of colonoscopy were explained to the patient in detail. Risks include but are not limited to bleeding, perforation, adverse reaction to medications, and cardiopulmonary compromise. Sequelae include but are not limited to the possibility of surgery, hospitalization, and mortality. The patient verbalized understanding and wished to proceed. All questions answered, referred to the scheduler and bowel prep ordered. Further recommendations pending results of the exam.    Dominic Pea Amora Sheehy ,DO, FACG 10/05/2021, 11:17 AM

## 2021-10-05 NOTE — Telephone Encounter (Signed)
Patient will do miralax prep with 2 days of dulcolax tablets. New instructions done and instructed to pt and she voiced understanding. Hospital case moved to 1115 by someone else. I called and spoke to [facility] and she said yes someone moved it due to the clean up time on the schedule. Pt was fine with everything

## 2021-10-06 NOTE — Telephone Encounter (Signed)
   Name: Paula Stein  DOB: 1945-11-22  MRN: [id number]  Primary Cardiologist: [name], MD   Preoperative team, please contact this patient and set up a phone call appointment for further preoperative risk assessment. Please obtain consent and complete medication review. Thank you for your help.  I confirm that guidance regarding antiplatelet and oral anticoagulation therapy has been completed and, if necessary, noted below.  Xarelto is not managed by cardiology.  Therefore, recommendations on holding Xarelto prior to procedure need to come from prescribing provider.  Lenna Sciara, NP 10/06/2021, 11:39 AM [place] [address] [place], [place] [address]

## 2021-10-06 NOTE — Telephone Encounter (Signed)
1st attempt... LM to call back to schedule TELE visit.

## 2021-10-07 NOTE — Telephone Encounter (Signed)
Left message x 2 for the pt call back for tele appt

## 2021-10-10 NOTE — Telephone Encounter (Signed)
LVM on cell and home for patient to call the office and sent mychart message

## 2021-10-11 ENCOUNTER — Encounter: Payer: Self-pay | Admitting: Family Medicine

## 2021-10-11 NOTE — Telephone Encounter (Signed)
Spoke with pt and gave her number to speak to preop team. Pt verbalized understanding and stated she would call [phone] .

## 2021-10-11 NOTE — Telephone Encounter (Signed)
Left message x 3 to call to schedule a tele visit for pre op clearance. I will update the requesting office the pt will need televisit for pre op assessment, though we have been unable to reach the pt. I will send a letter to the pt to call for tele appt. Will remove from the pre op call back pool. Will re-address once the pt calls back for appt.

## 2021-10-11 NOTE — Telephone Encounter (Signed)
See previous notes. I just returned the pt's call and got her vm again. Pt will need a tele appt for pre op clearance

## 2021-10-11 NOTE — Telephone Encounter (Signed)
Left voicemail for pt to call back. Attempted to call pt's son but was unable to leave voicemail.

## 2021-10-11 NOTE — Telephone Encounter (Signed)
Patient returned call to set up tele visit appt.

## 2021-10-11 NOTE — Telephone Encounter (Signed)
Spoke with pt and let her know she can hold her xarelto for 2 days prior to procedure. Pt verbalized understanding and had no other concerns at end of call.

## 2021-10-13 ENCOUNTER — Telehealth: Payer: Self-pay | Admitting: *Deleted

## 2021-10-13 NOTE — Telephone Encounter (Signed)
Spoke with pt who stated she could do any day other than July 19th and that she preferred morning appointments but could also do any time. Pt requested that Cardiology schedule the appointment and send her a mychart message with the appointment date and time. Secure chat message sent to Arbie Cookey at Cardiology to let her know pt's preference.

## 2021-10-13 NOTE — Telephone Encounter (Signed)
I have called the pt again and left message again to please call the office to set up a tele visit for pre op clearance. I will update the requesting provider that we still are trying to set up tele visit. Pt called back on Tues 10/11/21. I called her back within a few minutes and got her vm again.

## 2021-10-13 NOTE — Telephone Encounter (Signed)
Noted  

## 2021-10-13 NOTE — Telephone Encounter (Signed)
[name], RN messaged me through secure chat to let me know the pt called their office in regard to her needing an appt with cardiology. She stated to the GI office she can do any day except 10/19/21 and she prefers morning but can do anytime if needed. Pt asked to be notified through [facility] with the appt date and time. I assured Mickel Baas I will schedule appt and send MY CHART message to the pt. I will also send FYI to requesting office of the date and time of tele visit for pre op clearance assessment.  I thanked Mickel Baas and her office for all of their help in this matter as well.

## 2021-10-13 NOTE — Telephone Encounter (Signed)
Patient Consent for Virtual Visit        Paula Stein has provided verbal consent on 10/13/2021 for a virtual visit (video or telephone).   CONSENT FOR VIRTUAL VISIT FOR:  Paula Stein  By participating in this virtual visit I agree to the following:  I hereby voluntarily request, consent and authorize [facility] and its employed or contracted physicians, physician assistants, nurse practitioners or other licensed health care professionals (the Practitioner), to provide me with telemedicine health care services (the "Services") as deemed necessary by the treating Practitioner. I acknowledge and consent to receive the Services by the Practitioner via telemedicine. I understand that the telemedicine visit will involve communicating with the Practitioner through live audiovisual communication technology and the disclosure of certain medical information by electronic transmission. I acknowledge that I have been given the opportunity to request an in-person assessment or other available alternative prior to the telemedicine visit and am voluntarily participating in the telemedicine visit.  I understand that I have the right to withhold or withdraw my consent to the use of telemedicine in the course of my care at any time, without affecting my right to future care or treatment, and that the Practitioner or I may terminate the telemedicine visit at any time. I understand that I have the right to inspect all information obtained and/or recorded in the course of the telemedicine visit and may receive copies of available information for a reasonable fee.  I understand that some of the potential risks of receiving the Services via telemedicine include:  Delay or interruption in medical evaluation due to technological equipment failure or disruption; Information transmitted may not be sufficient (e.g. poor resolution of images) to allow for appropriate medical decision making by the Practitioner; and/or   In rare instances, security protocols could fail, causing a breach of personal health information.  Furthermore, I acknowledge that it is my responsibility to provide information about my medical history, conditions and care that is complete and accurate to the best of my ability. I acknowledge that Practitioner's advice, recommendations, and/or decision may be based on factors not within their control, such as incomplete or inaccurate data provided by me or distortions of diagnostic images or specimens that may result from electronic transmissions. I understand that the practice of medicine is not an exact science and that Practitioner makes no warranties or guarantees regarding treatment outcomes. I acknowledge that a copy of this consent can be made available to me via my patient portal ([employer]), or I can request a printed copy by calling the office of [facility].    I understand that my insurance will be billed for this visit.   I have read or had this consent read to me. I understand the contents of this consent, which adequately explains the benefits and risks of the Services being provided via telemedicine.  I have been provided ample opportunity to ask questions regarding this consent and the Services and have had my questions answered to my satisfaction. I give my informed consent for the services to be provided through the use of telemedicine in my medical care    Me to Paula Stein       10/13/21  4:53 PM Good afternoon Paula Stein,    I got a message from the GI office that you called them about your appt with cardiology. I understand that you requested any day except 10/19/21 and prefer morning appt. I have scheduled you for a telephone visit  10/24/21 @ 10 am. There is no video in this call as you may remember from covid, just as regular telephone visit.    I do have one question that I need to ask you. Have there been any changes with your medications that we need to  make note of; or are you still taking all of your medications as you normally do?    Thank you for your time [facility] Pre Op Team  This MyChart message has not been read. Me      10/13/21  4:49 PM Note [name], RN messaged me through secure chat to let me know the pt called their office in regard to her needing an appt with cardiology. She stated to the GI office she can do any day except 10/19/21 and she prefers morning but can do anytime if needed. Pt asked to be notified through [facility] with the appt date and time. I assured Paula Stein I will schedule appt and send MY CHART message to the pt. I will also send FYI to requesting office of the date and time of tele visit for pre op clearance assessment.  I thanked Paula Stein and her office for all of their help in this matter as well.

## 2021-10-13 NOTE — Telephone Encounter (Signed)
Left detailed voicemail on pt's phone.

## 2021-10-17 ENCOUNTER — Encounter: Payer: Self-pay | Admitting: Gastroenterology

## 2021-10-21 ENCOUNTER — Ambulatory Visit (INDEPENDENT_AMBULATORY_CARE_PROVIDER_SITE_OTHER): Payer: Medicare HMO

## 2021-10-21 VITALS — Ht 69.0 in | Wt 225.0 lb

## 2021-10-21 DIAGNOSIS — Z0000 Encounter for general adult medical examination without abnormal findings: Secondary | ICD-10-CM | POA: Diagnosis not present

## 2021-10-21 NOTE — Progress Notes (Signed)
Subjective:   Paula Stein is a 76 y.o. female who presents for Medicare Annual (Subsequent) preventive examination.  Review of Systems    Virtual Visit via Telephone Note  I connected with  Paula Stein on 10/21/21 at 10:30 AM EDT by telephone and verified that I am speaking with the correct person using two identifiers.  Location: Patient: Home Provider: Office Persons participating in the virtual visit: patient/Nurse Health Advisor   I discussed the limitations, risks, security and privacy concerns of performing an evaluation and management service by telephone and the availability of in person appointments. The patient expressed understanding and agreed to proceed.  Interactive audio and video telecommunications were attempted between this nurse and patient, however failed, due to patient having technical difficulties OR patient did not have access to video capability.  We continued and completed visit with audio only.  Some vital signs may be absent or patient reported.   Criselda Peaches, LPN  Cardiac Risk Factors include: advanced age (>49mn, >>51women);diabetes mellitus;hypertension     Objective:    Today's Vitals   10/21/21 1019  Weight: 225 lb (102.1 kg)  Height: 5' 9"  (1.753 m)   Body mass index is 33.23 kg/m.     10/21/2021   10:33 AM 07/06/2021    7:45 PM 01/17/2021   10:38 PM 10/20/2020    2:41 PM 10/13/2020    9:26 AM 03/14/2020    5:00 PM 03/14/2020    5:42 AM  Advanced Directives  Does Patient Have a Medical Advance Directive? Yes No No Yes Yes No No  Type of Advance Directive Living will;Out of facility DNR (pink MOST or yellow form)   [place] Living will    Does patient want to make changes to medical advance directive? No - Patient declined        Copy of [place]?    No - copy requested     Would patient like information on creating a medical advance directive?  No - Patient declined  No - Patient declined   No - Patient declined     Current Medications (verified) Outpatient Encounter Medications as of 10/21/2021  Medication Sig   ACCU-CHEK GUIDE test strip USE TO CHECK BLOOD SUGAR DAILY   Accu-Chek Softclix Lancets lancets USE TO CHECK BLOOD SUGAR DAILY   allopurinol (ZYLOPRIM) 100 MG tablet Take 1 tablet (100 mg total) by mouth daily.   Blood Glucose Monitoring Suppl (ACCU-CHEK GUIDE) w/Device KIT 1 each by Does not apply route daily. Use to check blood sugar daily   carvedilol (COREG) 25 MG tablet Take 1 tablet (25 mg total) by mouth 2 (two) times daily with a meal.   dapagliflozin propanediol (FARXIGA) 10 MG TABS tablet Take 1 tablet (10 mg total) by mouth daily before breakfast.   dicyclomine (BENTYL) 10 MG capsule Take 10 mg by mouth every 6 (six) hours as needed for cramping.   furosemide (LASIX) 40 MG tablet Take 1 tablet (40 mg total) by mouth 2 (two) times daily.   glipiZIDE (GLUCOTROL XL) 2.5 MG 24 hr tablet Take 1 tablet (2.5 mg total) by mouth daily with breakfast.   hydrOXYzine (VISTARIL) 25 MG capsule Take 1 capsule (25 mg total) by mouth every 6 (six) hours as needed for itching.   isosorbide mononitrate (IMDUR) 30 MG 24 hr tablet Take 1 tablet (30 mg total) by mouth daily.   levothyroxine (SYNTHROID) 125 MCG tablet TAKE 1 TABLET  EVERY MORNING ON AN EMPTY STOMACH 30 MINUTES BEFORE TAKING OTHER MEDICATIONS OR BEFORE BREAKFAST   rivaroxaban (XARELTO) 20 MG TABS tablet Take 1 tablet (20 mg total) by mouth daily with supper.   sacubitril-valsartan (ENTRESTO) 97-103 MG Take 1 tablet by mouth 2 (two) times daily.   spironolactone (ALDACTONE) 50 MG tablet Take 1 tablet (50 mg total) by mouth daily.   traZODone (DESYREL) 50 MG tablet Take 0.5-1 tablets (25-50 mg total) by mouth at bedtime as needed for sleep.   No facility-administered encounter medications on file as of 10/21/2021.    Allergies (verified) Jardiance [empagliflozin] and Ibuprofen   History: Past  Medical History:  Diagnosis Date   AICD (automatic cardioverter/defibrillator) present 10/08/2014   SUBQ    /    DR [name]   Cataract    CHF (congestive heart failure) (HCC)    Diabetes mellitus without complication (HCC)    GERD (gastroesophageal reflux disease)    History of cardiac cath 05/05/2007   normal-with patent coronaries   History of colonoscopy    History of hiatal hernia    Hyperlipidemia    Hypertension    Kidney failure    Stage 3   Non-ischemic cardiomyopathy (HCC)    EF 28%- reassessment of LV function 2011 with LVEf 45-50%   PAD (peripheral artery disease) (HCC)    lower extremities with ABIs of 0.5 bilaterally   Personal history of goiter    S/P radioactive iodine thyroid ablation    Sleep apnea    uses CPAP   Past Surgical History:  Procedure Laterality Date   BIOPSY  10/13/2020   Procedure: BIOPSY;  Surgeon: [name], DO;  Location: [facility];  Service: Gastroenterology;;   CATARACT EXTRACTION Bilateral    COLONOSCOPY WITH PROPOFOL N/A 10/13/2020   Procedure: COLONOSCOPY WITH PROPOFOL;  Surgeon: [name], DO;  Location: [facility];  Service: Gastroenterology;  Laterality: N/A;   EP IMPLANTABLE DEVICE N/A 10/08/2014   Procedure: SubQ ICD Implant;  Surgeon: [name], MD;  Location: [facility];  Service: Cardiovascular;  Laterality: N/A;   POLYPECTOMY  10/13/2020   Procedure: POLYPECTOMY;  Surgeon: [name], DO;  Location: [facility];  Service: Gastroenterology;;   [name] ICD CHANGEOUT N/A 11/14/2019   Procedure: QQIW ICD CHANGEOUT;  Surgeon: [name], MD;  Location: [facility];  Service: Cardiovascular;  Laterality: N/A;   THYROIDECTOMY     TONSILLECTOMY     TOTAL ABDOMINAL HYSTERECTOMY     Family History  Problem Relation Age of Onset   Diabetes type II Mother    Hypertension Mother    Diabetes Mother    Heart disease Mother    Other Father        deceased from accident age 14    Hyperlipidemia Father    Breast cancer Neg Hx    Stomach cancer Neg Hx    Colon cancer Neg Hx    Esophageal cancer Neg Hx    Pancreatic cancer Neg Hx    Social History   Socioeconomic History   Marital status: Divorced    Spouse name: Not on file   Number of children: 1   Years of education: Not on file   Highest education level: Some college, no degree  Occupational History   Occupation: Retired    Fish farm manager: RETIRED    Comment: Education officer, museum  Tobacco Use   Smoking status: Light Smoker    Packs/day: 0.25    Years:  40.00    Total pack years: 10.00    Types: Cigarettes   Smokeless tobacco: Never   Tobacco comments:    on and off,currently a pack / week ( 10/05/20)   Does 1 cigarette daily (10/05/2021)  Vaping Use   Vaping Use: Never used  Substance and Sexual Activity   Alcohol use: Yes    Alcohol/week: 0.0 standard drinks of alcohol    Comment: rare glass of wine   Drug use: No   Sexual activity: Not Currently    Birth control/protection: None  Other Topics Concern   Not on file  Social History Narrative   Retired Education officer, museum   Divorced - one grown son   current smoker    Alcohol use-no      Drug use-no    Regular exercise- no     son - Lameeka Schleifer   Social Determinants of Health   Financial Resource Strain: Low Risk  (10/21/2021)   Overall Financial Resource Strain (CARDIA)    Difficulty of Paying Living Expenses: Not very hard  Food Insecurity: No Food Insecurity (10/21/2021)   Hunger Vital Sign    Worried About Running Out of Food in the Last Year: Never true    Ran Out of Food in the Last Year: Never true  Transportation Needs: No Transportation Needs (10/21/2021)   PRAPARE - Hydrologist (Medical): No    Lack of Transportation (Non-Medical): No  Physical Activity: Insufficiently Active (10/21/2021)   Exercise Vital Sign    Days of Exercise per Week: 1 day    Minutes of Exercise per Session: 10 min  Stress: No Stress Concern  Present (10/21/2021)   [place]    Feeling of Stress : Only a little  Social Connections: Moderately Integrated (10/21/2021)   Social Connection and Isolation Panel [NHANES]    Frequency of Communication with Friends and Family: More than three times a week    Frequency of Social Gatherings with Friends and Family: More than three times a week    Attends Religious Services: More than 4 times per year    Active Member of [employer] or Organizations: Yes    Attends Archivist Meetings: 1 to 4 times per year    Marital Status: Divorced    Tobacco Counseling Ready to quit: Yes Counseling given: Yes Tobacco comments: on and off,currently a pack / week ( 10/05/20)   Does 1 cigarette daily (10/05/2021)   Clinical Intake:  Pre-visit preparation completed: YesNutrition Risk Assessment:  Has the patient had any N/V/D within the last 2 months?  No  Does the patient have any non-healing wounds?  No  Has the patient had any unintentional weight loss or weight gain?  No   Diabetes:  Is the patient diabetic?  Yes  If diabetic, was a CBG obtained today?  Yes   CBG 116 Taken by patient Did the patient bring in their glucometer from home?  No  How often do you monitor your CBG's? Daily.   Financial Strains and Diabetes Management:  Are you having any financial strains with the device, your supplies or your medication? No .  Does the patient want to be seen by Chronic Care Management for management of their diabetes?  No  Would the patient like to be referred to a Nutritionist or for Diabetic Management?  No   Diabetic Exams:  Diabetic Eye Exam: Completed No. Overdue for diabetic eye exam.  Pt has been advised about the importance in completing this exam. A referral has been placed today. Message sent to referral coordinator for scheduling purposes. Advised pt to expect a call from office referred to regarding appt.  Diabetic  Foot Exam: Completed No. Pt has been advised about the importance in completing this exam. Pt is scheduled for diabetic foot exam on Followed by PCP.   BMI - recorded: 33.21 Nutritional Status: BMI > 30  Obese Nutritional Risks: None Diabetes: Yes CBG done?: Yes CBG resulted in Enter/ Edit results?: Yes (CBG 116 Taken by patient) Did pt. bring in CBG monitor from home?: No  How often do you need to have someone help you when you read instructions, pamphlets, or other written materials from your doctor or pharmacy?: 1 - Never  Diabetic?  Yes  Interpreter Needed?: No  Information entered by :: [name] LPN   Activities of Daily Living    10/21/2021   10:29 AM 10/19/2021    8:51 PM  In your present state of health, do you have any difficulty performing the following activities:  Hearing? 1 1  Comment Wears hearing aids   Vision? 0 0  Difficulty concentrating or making decisions? 0 0  Walking or climbing stairs? 1 1  Comment Patient stated due to SOB. Followed by PCP   Dressing or bathing? 0 0  Doing errands, shopping? 0 1  Preparing Food and eating ? N N  Using the Toilet? N N  In the past six months, have you accidently leaked urine? N N  Do you have problems with loss of bowel control? N N  Managing your Medications? N   Managing your Finances? N N  Housekeeping or managing your Housekeeping? N N    Patient Care Team: [name], MD as PCP - General (Family Medicine) [name], MD as PCP - Cardiology (Cardiology) [name], MD as PCP - Electrophysiology (Cardiology) [name], DO as Consulting Physician (Gastroenterology) Viona Gilmore, [facility] as Pharmacist (Pharmacist) Orson Slick, MD as Consulting Physician (Hematology and Oncology)  Indicate any recent Medical Services you may have received from other than [facility] providers in the past year (date may be approximate).     Assessment:   This is a routine wellness examination for  Kaila.  Hearing/Vision screen Hearing Screening - Comments:: Wears hearing aids Vision Screening - Comments:: Wears glasses. Followed by Dr Herschel Senegal  Dietary issues and exercise activities discussed: Exercise limited by: None identified   Goals Addressed               This Visit's Progress     Quit Smoking (pt-stated)         Depression Screen    10/21/2021   10:26 AM 09/09/2021    8:53 AM 10/20/2020    2:41 PM 10/20/2020    2:39 PM 10/21/2019    9:16 AM 09/04/2017    9:34 AM 08/21/2016   10:07 AM  PHQ 2/9 Scores  PHQ - 2 Score 0 0 0 0 0 0 0  PHQ- 9 Score 0 3   0      Fall Risk    10/21/2021   10:32 AM 10/19/2021    8:51 PM 09/09/2021    8:52 AM 09/09/2021    7:57 AM 07/21/2021   10:32 AM  Fall Risk   Falls in the past year? 0 0 0 0 0  Number falls in past yr: 0 0 0  0  Injury with Fall? 0 0 0  0  Risk for fall due to : No Fall Risks  No Fall Risks  No Fall Risks  Follow up   Falls evaluation completed  Falls evaluation completed    [place]:  Any stairs in or around the home? Yes  If so, are there any without handrails? No  Home free of loose throw rugs in walkways, pet beds, electrical cords, etc? Yes  Adequate lighting in your home to reduce risk of falls? Yes   ASSISTIVE DEVICES UTILIZED TO PREVENT FALLS:  Life alert? No  Use of a cane, walker or w/c? No  Grab bars in the bathroom? No  Shower chair or bench in shower? Yes  Elevated toilet seat or a handicapped toilet? Yes   TIMED UP AND GO:  Was the test performed? No . Audio Visit  Cognitive Function:        10/21/2021   10:34 AM 10/21/2019    9:18 AM  6CIT Screen  What Year? 0 points 0 points  What month? 0 points 0 points  What time? 0 points 0 points  Count back from 20 0 points 0 points  Months in reverse 0 points 0 points  Repeat phrase 0 points 0 points  Total Score 0 points 0 points    Immunizations Immunization History  Administered Date(s)  Administered   Fluad Quad(high Dose 65+) 12/24/2018, 12/15/2019, 01/14/2021   Influenza Split 02/15/2011   Influenza Whole 08/22/2004, 02/18/2007, 12/31/2008, 02/17/2010, 01/02/2012   Influenza, High Dose Seasonal PF 01/04/2015, 12/11/2016, 01/03/2018   Influenza,inj,Quad PF,6+ Mos 06/26/2013, 02/03/2014   Influenza-Unspecified 02/02/2016   Moderna Sars-Covid-2 Vaccination 04/18/2019, 05/16/2019, 02/03/2020, 09/14/2020, 01/04/2021   Pneumococcal Conjugate-13 06/26/2013   Pneumococcal Polysaccharide-23 08/22/2004, 02/03/2014   Td 12/22/2002   Tdap 06/26/2013, 01/18/2021   Zoster Recombinat (Shingrix) 10/22/2019, 01/02/2020   Zoster, Live 07/24/2014    TDAP status: Up to date  Flu Vaccine status: Up to date  Pneumococcal vaccine status: Up to date  Covid-19 vaccine status: Completed vaccines  Qualifies for Shingles Vaccine? Yes   Zostavax completed Yes   Shingrix Completed?: Yes  Screening Tests Health Maintenance  Topic Date Due   FOOT EXAM  05/31/2021   OPHTHALMOLOGY EXAM  06/12/2021   COVID-19 Vaccine (6 - Booster for Moderna series) 11/06/2021 (Originally 03/01/2021)   INFLUENZA VACCINE  11/01/2021   HEMOGLOBIN A1C  01/20/2022   TETANUS/TDAP  01/19/2031   Pneumonia Vaccine 48+ Years old  Completed   DEXA SCAN  Completed   Hepatitis C Screening  Completed   Zoster Vaccines- Shingrix  Completed   HPV VACCINES  Aged Out   COLONOSCOPY (Pts 45-67yr Insurance coverage will need to be confirmed)  Discontinued    Health Maintenance  Health Maintenance Due  Topic Date Due   FOOT EXAM  05/31/2021   OPHTHALMOLOGY EXAM  06/12/2021    Colorectal cancer screening: Type of screening: Colonoscopy. Completed 10/13/20. Repeat every   years    Bone Density status: Completed 01/01/20. Results reflect: Bone density results: OSTEOPOROSIS. Repeat every   years.  Lung Cancer Screening: (Low Dose CT Chest recommended if Age 682-80years, 30 pack-year currently smoking OR have quit  w/in 15years.) does qualify.   Lung Cancer Screening Referral: Patient deferred  Additional Screening:  Hepatitis C Screening: does qualify; Completed 06/02/15  Vision Screening: Recommended annual ophthalmology exams for early detection of glaucoma and other disorders of the eye. Is the patient up to date  with their annual eye exam?  Yes  Who is the provider or what is the name of the office in which the patient attends annual eye exams? [facility] If pt is not established with a provider, would they like to be referred to a provider to establish care? No .   Dental Screening: Recommended annual dental exams for proper oral hygiene  Community Resource Referral / Chronic Care Management:  CRR required this visit?  No   CCM required this visit?  No      Plan:     I have personally reviewed and noted the following in the patient's chart:   Medical and social history Use of alcohol, tobacco or illicit drugs  Current medications and supplements including opioid prescriptions.  Functional ability and status Nutritional status Physical activity Advanced directives List of other physicians Hospitalizations, surgeries, and ER visits in previous 12 months Vitals Screenings to include cognitive, depression, and falls Referrals and appointments  In addition, I have reviewed and discussed with patient certain preventive protocols, quality metrics, and best practice recommendations. A written personalized care plan for preventive services as well as general preventive health recommendations were provided to patient.     Criselda Peaches, LPN   7/47/1855   Nurse Notes: None

## 2021-10-21 NOTE — Patient Instructions (Addendum)
[name] , Thank you for taking time to come for your Medicare Wellness Visit. I appreciate your ongoing commitment to your health goals. Please review the following plan we discussed and let me know if I can assist you in the future.   These are the goals we discussed:  Goals       Exercise 150 minutes per week (moderate activity)      Plan is to join grand[facility]tr [facility] [facility] [place] By all means, start doing line dancing      Manage My Medicine      Timeframe:  Long-Range Goal Priority:  High Start Date:                             Expected End Date:                       Follow Up Date 09/14/21    - call for medicine refill 2 or 3 days before it runs out - keep a list of all the medicines I take; vitamins and herbals too - use a pillbox to sort medicine - use an alarm clock or phone to remind me to take my medicine    Why is this important?   These steps will help you keep on track with your medicines.   Notes:       Patient Stated      Insomnia Insomnia is a sleep disorder that makes it difficult to fall asleep or to stay asleep. Insomnia can cause tiredness (fatigue), low energy, difficulty concentrating, mood swings, and poor performance at work or school. There are three different ways to classify insomnia: Difficulty falling asleep. Difficulty staying asleep. Waking up too early in the morning.  Any type of insomnia can be long-term (chronic) or short-term (acute). Both are common. Short-term insomnia usually lasts for three months or less. Chronic insomnia occurs at least three times a week for longer than three months. What are the causes? Insomnia may be caused by another condition, situation, or substance, such as: Anxiety. Certain medicines. Gastroesophageal reflux disease (GERD) or other gastrointestinal conditions. Asthma or other breathing conditions. Restless legs syndrome, sleep apnea, or other sleep disorders. Chronic  pain. Menopause. This may include hot flashes. Stroke. Abuse of alcohol, tobacco, or illegal drugs. Depression. Caffeine. Neurological disorders, such as Alzheimer disease. An overactive thyroid (hyperthyroidism).  The cause of insomnia may not be known. What increases the risk? Risk factors for insomnia include: Gender. Women are more commonly affected than men. Age. Insomnia is more common as you get older. Stress. This may involve your professional or personal life. Income. Insomnia is more common in people with lower income. Lack of exercise. Irregular work schedule or night shifts. Traveling between different time zones.  What are the signs or symptoms? If you have insomnia, trouble falling asleep or trouble staying asleep is the main symptom. This may lead to other symptoms, such as: Feeling fatigued. Feeling nervous about going to sleep. Not feeling rested in the morning. Having trouble concentrating. Feeling irritable, anxious, or depressed.  How is this treated? Treatment for insomnia depends on the cause. If your insomnia is caused by an underlying condition, treatment will focus on addressing the condition. Treatment may also include: Medicines to help you sleep. Counseling or therapy. Lifestyle adjustments.  Follow these instructions at home: Take medicines only as directed by your health  care provider. Keep regular sleeping and waking hours. Avoid naps. Keep a sleep diary to help you and your health care provider figure out what could be causing your insomnia. Include: When you sleep. When you wake up during the night. How well you sleep. How rested you feel the next day. Any side effects of medicines you are taking. What you eat and drink. Make your bedroom a comfortable place where it is easy to fall asleep: Put up shades or special blackout curtains to block light from outside. Use a white noise machine to block noise. Keep the temperature  cool. Exercise regularly as directed by your health care provider. Avoid exercising right before bedtime. Use relaxation techniques to manage stress. Ask your health care provider to suggest some techniques that may work well for you. These may include: Breathing exercises. Routines to release muscle tension. Visualizing peaceful scenes. Cut back on alcohol, caffeinated beverages, and cigarettes, especially close to bedtime. These can disrupt your sleep. Do not overeat or eat spicy foods right before bedtime. This can lead to digestive discomfort that can make it hard for you to sleep. Limit screen use before bedtime. This includes: Watching TV. Using your smartphone, tablet, and computer. Stick to a routine. This can help you fall asleep faster. Try to do a quiet activity, brush your teeth, and go to bed at the same time each night. Get out of bed if you are still awake after 15 minutes of trying to sleep. Keep the lights down, but try reading or doing a quiet activity. When you feel sleepy, go back to bed. Make sure that you drive carefully. Avoid driving if you feel very sleepy. Keep all follow-up appointments as directed by your health care provider. This is important. Contact a health care provider if: You are tired throughout the day or have trouble in your daily routine due to sleepiness. You continue to have sleep problems or your sleep problems get worse. Get help right away if: You have serious thoughts about hurting yourself or someone else. This information is not intended to replace advice given to you by your health care provider. Make sure you discuss any questions you have with your health care provider. Document Released: 03/17/2000 Document Revised: 08/20/2015 Document Reviewed: 12/19/2013 Elsevier Interactive Patient Education  2018 [employer].       Patient Stated      I will continue to take my medications as prescribed       Quit Smoking (pt-stated)      Track  and Manage My Blood Pressure-Hypertension      Timeframe:  Long-Range Goal Priority:  High Start Date:                             Expected End Date:                       Follow Up Date 09/14/21    - check blood pressure weekly - choose a place to take my blood pressure (home, clinic or office, retail store) - write blood pressure results in a log or diary    Why is this important?   You won't feel high blood pressure, but it can still hurt your blood vessels.  High blood pressure can cause heart or kidney problems. It can also cause a stroke.  Making lifestyle changes like losing a little weight or eating less salt will help.  Checking your  blood pressure at home and at different times of the day can help to control blood pressure.  If the doctor prescribes medicine remember to take it the way the doctor ordered.  Call the office if you cannot afford the medicine or if there are questions about it.     Notes:         This is a list of the screening recommended for you and due dates:  Health Maintenance  Topic Date Due   Complete foot exam   05/31/2021   Eye exam for diabetics  06/12/2021   COVID-19 Vaccine (6 - Booster for Moderna series) 11/06/2021*   Flu Shot  11/01/2021   Hemoglobin A1C  01/20/2022   Tetanus Vaccine  01/19/2031   Pneumonia Vaccine  Completed   DEXA scan (bone density measurement)  Completed   Hepatitis C Screening: USPSTF Recommendation to screen - Ages 4-79 yo.  Completed   Zoster (Shingles) Vaccine  Completed   HPV Vaccine  Aged Out   Colon Cancer Screening  Discontinued  *Topic was postponed. The date shown is not the original due date.    Advanced directives: Yes  Conditions/risks identified: None  Next appointment: Follow up in one year for your annual wellness visit     Preventive Care 65 Years and Older, Female Preventive care refers to lifestyle choices and visits with your health care provider that can promote health and  wellness. What does preventive care include? A yearly physical exam. This is also called an annual well check. Dental exams once or twice a year. Routine eye exams. Ask your health care provider how often you should have your eyes checked. Personal lifestyle choices, including: Daily care of your teeth and gums. Regular physical activity. Eating a healthy diet. Avoiding tobacco and drug use. Limiting alcohol use. Practicing safe sex. Taking low-dose aspirin every day. Taking vitamin and mineral supplements as recommended by your health care provider. What happens during an annual well check? The services and screenings done by your health care provider during your annual well check will depend on your age, overall health, lifestyle risk factors, and family history of disease. Counseling  Your health care provider may ask you questions about your: Alcohol use. Tobacco use. Drug use. Emotional well-being. Home and relationship well-being. Sexual activity. Eating habits. History of falls. Memory and ability to understand (cognition). Work and work Statistician. Reproductive health. Screening  You may have the following tests or measurements: Height, weight, and BMI. Blood pressure. Lipid and cholesterol levels. These may be checked every 5 years, or more frequently if you are over 38 years old. Skin check. Lung cancer screening. You may have this screening every year starting at age 81 if you have a 30-pack-year history of smoking and currently smoke or have quit within the past 15 years. Fecal occult blood test (FOBT) of the stool. You may have this test every year starting at age 69. Flexible sigmoidoscopy or colonoscopy. You may have a sigmoidoscopy every 5 years or a colonoscopy every 10 years starting at age 74. Hepatitis C blood test. Hepatitis B blood test. Sexually transmitted disease (STD) testing. Diabetes screening. This is done by checking your blood sugar (glucose)  after you have not eaten for a while (fasting). You may have this done every 1-3 years. Bone density scan. This is done to screen for osteoporosis. You may have this done starting at age 34. Mammogram. This may be done every 1-2 years. Talk to your health care provider about  how often you should have regular mammograms. Talk with your health care provider about your test results, treatment options, and if necessary, the need for more tests. Vaccines  Your health care provider may recommend certain vaccines, such as: Influenza vaccine. This is recommended every year. Tetanus, diphtheria, and acellular pertussis (Tdap, Td) vaccine. You may need a Td booster every 10 years. Zoster vaccine. You may need this after age 6. Pneumococcal 13-valent conjugate (PCV13) vaccine. One dose is recommended after age 75. Pneumococcal polysaccharide (PPSV23) vaccine. One dose is recommended after age 81. Talk to your health care provider about which screenings and vaccines you need and how often you need them. This information is not intended to replace advice given to you by your health care provider. Make sure you discuss any questions you have with your health care provider. Document Released: 04/16/2015 Document Revised: 12/08/2015 Document Reviewed: 01/19/2015 Elsevier Interactive Patient Education  2017 [place] Prevention in the Home Falls can cause injuries. They can happen to people of all ages. There are many things you can do to make your home safe and to help prevent falls. What can I do on the outside of my home? Regularly fix the edges of walkways and driveways and fix any cracks. Remove anything that might make you trip as you walk through a door, such as a raised step or threshold. Trim any bushes or trees on the path to your home. Use bright outdoor lighting. Clear any walking paths of anything that might make someone trip, such as rocks or tools. Regularly check to see if  handrails are loose or broken. Make sure that both sides of any steps have handrails. Any raised decks and porches should have guardrails on the edges. Have any leaves, snow, or ice cleared regularly. Use sand or salt on walking paths during winter. Clean up any spills in your garage right away. This includes oil or grease spills. What can I do in the bathroom? Use night lights. Install grab bars by the toilet and in the tub and shower. Do not use towel bars as grab bars. Use non-skid mats or decals in the tub or shower. If you need to sit down in the shower, use a plastic, non-slip stool. Keep the floor dry. Clean up any water that spills on the floor as soon as it happens. Remove soap buildup in the tub or shower regularly. Attach bath mats securely with double-sided non-slip rug tape. Do not have throw rugs and other things on the floor that can make you trip. What can I do in the bedroom? Use night lights. Make sure that you have a light by your bed that is easy to reach. Do not use any sheets or blankets that are too big for your bed. They should not hang down onto the floor. Have a firm chair that has side arms. You can use this for support while you get dressed. Do not have throw rugs and other things on the floor that can make you trip. What can I do in the kitchen? Clean up any spills right away. Avoid walking on wet floors. Keep items that you use a lot in easy-to-reach places. If you need to reach something above you, use a strong step stool that has a grab bar. Keep electrical cords out of the way. Do not use floor polish or wax that makes floors slippery. If you must use wax, use non-skid floor wax. Do not have throw rugs and other things  on the floor that can make you trip. What can I do with my stairs? Do not leave any items on the stairs. Make sure that there are handrails on both sides of the stairs and use them. Fix handrails that are broken or loose. Make sure that  handrails are as long as the stairways. Check any carpeting to make sure that it is firmly attached to the stairs. Fix any carpet that is loose or worn. Avoid having throw rugs at the top or bottom of the stairs. If you do have throw rugs, attach them to the floor with carpet tape. Make sure that you have a light switch at the top of the stairs and the bottom of the stairs. If you do not have them, ask someone to add them for you. What else can I do to help prevent falls? Wear shoes that: Do not have high heels. Have rubber bottoms. Are comfortable and fit you well. Are closed at the toe. Do not wear sandals. If you use a stepladder: Make sure that it is fully opened. Do not climb a closed stepladder. Make sure that both sides of the stepladder are locked into place. Ask someone to hold it for you, if possible. Clearly mark and make sure that you can see: Any grab bars or handrails. First and last steps. Where the edge of each step is. Use tools that help you move around (mobility aids) if they are needed. These include: Canes. Walkers. Scooters. Crutches. Turn on the lights when you go into a dark area. Replace any light bulbs as soon as they burn out. Set up your furniture so you have a clear path. Avoid moving your furniture around. If any of your floors are uneven, fix them. If there are any pets around you, be aware of where they are. Review your medicines with your doctor. Some medicines can make you feel dizzy. This can increase your chance of falling. Ask your doctor what other things that you can do to help prevent falls. This information is not intended to replace advice given to you by your health care provider. Make sure you discuss any questions you have with your health care provider. Document Released: 01/14/2009 Document Revised: 08/26/2015 Document Reviewed: 04/24/2014 Elsevier Interactive Patient Education  2017 [employer].

## 2021-10-24 ENCOUNTER — Ambulatory Visit (INDEPENDENT_AMBULATORY_CARE_PROVIDER_SITE_OTHER): Payer: Medicare HMO | Admitting: Nurse Practitioner

## 2021-10-24 DIAGNOSIS — Z01810 Encounter for preprocedural cardiovascular examination: Secondary | ICD-10-CM

## 2021-10-24 NOTE — Progress Notes (Signed)
Virtual Visit via Telephone Note   Because of Paula Stein's co-morbid illnesses, she is at least at moderate risk for complications without adequate follow up.  This format is felt to be most appropriate for this patient at this time.  The patient did not have access to video technology/had technical difficulties with video requiring transitioning to audio format only (telephone).  All issues noted in this document were discussed and addressed.  No physical exam could be performed with this format.  Please refer to the patient's chart for her consent to telehealth for [facility].  Evaluation Performed:  Preoperative cardiovascular risk assessment _____________   Date:  10/24/2021   Patient ID:  Paula Stein, DOB 1945-09-07, MRN [id number] Patient Location:  Home Provider location:   Office  Primary Care Provider:  [name], MD Primary Cardiologist:  [name], MD  Chief Complaint / Patient Profile   76 y.o. y/o female with a h/o nonobstructive CAD, chronic systolic heart failure s/p ICD, hypertension, CKD stage IIIb, obesity, OSA, and tobacco use who is pending colonoscopy on 11/10/2021 with Dr. Bryan Lemma of [facility] and presents today for telephonic preoperative cardiovascular risk assessment.  Past Medical History    Past Medical History:  Diagnosis Date   AICD (automatic cardioverter/defibrillator) present 10/08/2014   SUBQ    /    DR [name]   Cataract    CHF (congestive heart failure) (HCC)    Diabetes mellitus without complication ([place])    GERD (gastroesophageal reflux disease)    History of cardiac cath 05/05/2007   normal-with patent coronaries   History of colonoscopy    History of hiatal hernia    Hyperlipidemia    Hypertension    Kidney failure    Stage 3   Non-ischemic cardiomyopathy (HCC)    EF 28%- reassessment of LV function 2011 with LVEf 45-50%   PAD (peripheral artery disease) (HCC)    lower extremities with ABIs of 0.5  bilaterally   Personal history of goiter    S/P radioactive iodine thyroid ablation    Sleep apnea    uses CPAP   Past Surgical History:  Procedure Laterality Date   BIOPSY  10/13/2020   Procedure: BIOPSY;  Surgeon: [name], DO;  Location: [facility];  Service: Gastroenterology;;   CATARACT EXTRACTION Bilateral    COLONOSCOPY WITH PROPOFOL N/A 10/13/2020   Procedure: COLONOSCOPY WITH PROPOFOL;  Surgeon: [name], DO;  Location: [facility];  Service: Gastroenterology;  Laterality: N/A;   EP IMPLANTABLE DEVICE N/A 10/08/2014   Procedure: SubQ ICD Implant;  Surgeon: [name], MD;  Location: [place] [facility];  Service: Cardiovascular;  Laterality: N/A;   POLYPECTOMY  10/13/2020   Procedure: POLYPECTOMY;  Surgeon: [name], DO;  Location: [facility];  Service: Gastroenterology;;   [name] ICD CHANGEOUT N/A 11/14/2019   Procedure: LYYT ICD CHANGEOUT;  Surgeon: [name], MD;  Location: [place] [facility];  Service: Cardiovascular;  Laterality: N/A;   THYROIDECTOMY     TONSILLECTOMY     TOTAL ABDOMINAL HYSTERECTOMY      Allergies  Allergies  Allergen Reactions   Jardiance [Empagliflozin] Itching   Ibuprofen Nausea Only and Other (See Comments)    History of Present Illness    Paula Stein is a 76 y.o. female who presents via audio/video conferencing for a telehealth visit today.  Pt was last seen in cardiology clinic on 08/12/2021 by Dr. Burt Knack.  At that time Paula  R Stein was doing well. She reported NYHA functional class II symptoms.  She was started on Jardiance, however this was later stopped due to itching. The patient is now pending procedure as outlined above. Since her last visit, she has done well from a cardiac standpoint. She is currently awaiting approval for patient assistance for Farxiga. She denies chest pain, palpitations, dyspnea, pnd, orthopnea, n, v, dizziness, syncope, edema, weight gain, or early satiety. All other systems  reviewed and are otherwise negative except as noted above.   Home Medications    Prior to Admission medications   Medication Sig Start Date End Date Taking? Authorizing Provider  ACCU-CHEK GUIDE test strip USE TO CHECK BLOOD SUGAR DAILY 03/17/21   [name], MD  Accu-Chek Softclix Lancets lancets USE TO CHECK BLOOD SUGAR DAILY 03/17/21   [name], MD  allopurinol (ZYLOPRIM) 100 MG tablet Take 1 tablet (100 mg total) by mouth daily. 08/02/21   [name], MD  Blood Glucose Monitoring Suppl (ACCU-CHEK GUIDE) w/Device KIT 1 each by Does not apply route daily. Use to check blood sugar daily 09/30/21   [name], MD  carvedilol (COREG) 25 MG tablet Take 1 tablet (25 mg total) by mouth 2 (two) times daily with a meal. 08/01/21   [name], MD  dapagliflozin propanediol (FARXIGA) 10 MG TABS tablet Take 1 tablet (10 mg total) by mouth daily before breakfast. 09/09/21   [name], MD  dicyclomine (BENTYL) 10 MG capsule Take 10 mg by mouth every 6 (six) hours as needed for cramping. 06/23/21   [provider]  furosemide (LASIX) 40 MG tablet Take 1 tablet (40 mg total) by mouth 2 (two) times daily. 08/01/21   [name], MD  glipiZIDE (GLUCOTROL XL) 2.5 MG 24 hr tablet Take 1 tablet (2.5 mg total) by mouth daily with breakfast. 08/02/21   [name], MD  hydrOXYzine (VISTARIL) 25 MG capsule Take 1 capsule (25 mg total) by mouth every 6 (six) hours as needed for itching. 09/09/21   [name], MD  isosorbide mononitrate (IMDUR) 30 MG 24 hr tablet Take 1 tablet (30 mg total) by mouth daily. 08/01/21   [name], MD  levothyroxine (SYNTHROID) 125 MCG tablet TAKE 1 TABLET EVERY MORNING ON AN EMPTY STOMACH 30 MINUTES BEFORE TAKING OTHER MEDICATIONS OR BEFORE BREAKFAST 08/02/21   [name], MD  rivaroxaban (XARELTO) 20 MG TABS tablet Take 1 tablet (20 mg total) by mouth daily with supper. 08/02/21   Orson Slick, MD  sacubitril-valsartan (ENTRESTO)  97-103 MG Take 1 tablet by mouth 2 (two) times daily. 08/01/21   [name], MD  spironolactone (ALDACTONE) 50 MG tablet Take 1 tablet (50 mg total) by mouth daily. 08/01/21   [name], MD  traZODone (DESYREL) 50 MG tablet Take 0.5-1 tablets (25-50 mg total) by mouth at bedtime as needed for sleep. 07/21/21   [name], MD    Physical Exam    Vital Signs:  CHANETTA MOOSMAN does not have vital signs available for review today.  Given telephonic nature of communication, physical exam is limited. AAOx3. NAD. Normal affect.  Speech and respirations are unlabored.  Accessory Clinical Findings    None  Assessment & Plan    1.  Preoperative Cardiovascular Risk Assessment:  According to the Revised Cardiac Risk Index (RCRI), her Perioperative Risk of Major Cardiac Event is (%): 0.9. Her Functional Capacity in METs is: 5.47  according to the [place] Activity Status Index (DASI). Therefore, based on ACC/AHA guidelines, patient would be at acceptable risk for the planned procedure without further cardiovascular testing.  Xarelto is not managed by cardiology. Therefore, recommendations for holding Xarelto prior to procedure need to come from prescribing provider.  A copy of this note will be routed to requesting surgeon.  Time:   Today, I have spent 6 minutes with the patient with telehealth technology discussing medical history, symptoms, and management plan.     Lenna Sciara, NP  10/24/2021, 10:39 AM

## 2021-10-27 ENCOUNTER — Telehealth: Payer: Self-pay | Admitting: Pharmacist

## 2021-10-27 NOTE — Chronic Care Management (AMB) (Signed)
    Chronic Care Management Pharmacy Assistant   Name: Paula Stein  MRN: [id number] DOB: Feb 02, 1946   10/27/21 APPOINTMENT REMINDER   Called Patient No answer, left message of appointment on 10/28/21 at 10 via telephone visit with [name], Pharm D.   Notified to have all medications, supplements, blood pressure and/or blood sugar logs available during appointment and to return call if need to reschedule.     Care Gaps: Foot Exam - Overdue Eye Exam - Overdue COVID Booster - Postponed BP- 148/76 10/05/21 Lab Results  Component Value Date   HGBA1C 6.9 (H) 07/21/2021    Star Rating Drug: Glipizide 2.5 mg - Last filled 10/05/21 30 DS at [facility] Empagliflozin ( Jardiance) 10 mg - Last filled 08/12/21 30 DS at [facility]      Medications: Outpatient Encounter Medications as of 10/27/2021  Medication Sig   ACCU-CHEK GUIDE test strip USE TO CHECK BLOOD SUGAR DAILY   Accu-Chek Softclix Lancets lancets USE TO CHECK BLOOD SUGAR DAILY   allopurinol (ZYLOPRIM) 100 MG tablet Take 1 tablet (100 mg total) by mouth daily.   Blood Glucose Monitoring Suppl (ACCU-CHEK GUIDE) w/Device KIT 1 each by Does not apply route daily. Use to check blood sugar daily   carvedilol (COREG) 25 MG tablet Take 1 tablet (25 mg total) by mouth 2 (two) times daily with a meal.   dapagliflozin propanediol (FARXIGA) 10 MG TABS tablet Take 1 tablet (10 mg total) by mouth daily before breakfast.   dicyclomine (BENTYL) 10 MG capsule Take 10 mg by mouth every 6 (six) hours as needed for cramping.   furosemide (LASIX) 40 MG tablet Take 1 tablet (40 mg total) by mouth 2 (two) times daily.   glipiZIDE (GLUCOTROL XL) 2.5 MG 24 hr tablet Take 1 tablet (2.5 mg total) by mouth daily with breakfast.   hydrOXYzine (VISTARIL) 25 MG capsule Take 1 capsule (25 mg total) by mouth every 6 (six) hours as needed for itching.   isosorbide mononitrate (IMDUR) 30 MG 24 hr tablet Take 1 tablet (30 mg total) by mouth daily.    levothyroxine (SYNTHROID) 125 MCG tablet TAKE 1 TABLET EVERY MORNING ON AN EMPTY STOMACH 30 MINUTES BEFORE TAKING OTHER MEDICATIONS OR BEFORE BREAKFAST   rivaroxaban (XARELTO) 20 MG TABS tablet Take 1 tablet (20 mg total) by mouth daily with supper.   sacubitril-valsartan (ENTRESTO) 97-103 MG Take 1 tablet by mouth 2 (two) times daily.   spironolactone (ALDACTONE) 50 MG tablet Take 1 tablet (50 mg total) by mouth daily.   traZODone (DESYREL) 50 MG tablet Take 0.5-1 tablets (25-50 mg total) by mouth at bedtime as needed for sleep.   No facility-administered encounter medications on file as of 10/27/2021.       [place] Clinical Pharmacist Assistant [phone]

## 2021-10-27 NOTE — Progress Notes (Deleted)
Chronic Care Management Pharmacy Note  10/27/2021 Name:  Paula Stein MRN:  [id number] DOB:  03-08-1946  Summary: Pt is not taking medications as prescribed LDL not at goal < 55 Pt quit smoking 1 month ago  Recommendations/Changes made from today's visit: -Requested refills for furosemide and isosorbide -Recommended nephrology referral -Recommend high intensity statin therapy  -Recommend repeat uric acid level, TSH, and lipid panel -Consider transitioning to [facility] for adherence packaging -Recommend taking allopurinol daily for gout prevention  Plan: BP and HF assessment in 1 month PAP for Entresto and Xarelto Follow up in 3 months   Subjective: Paula Stein is an 76 y.o. year old female who is a primary patient of [name], MD.  The CCM team was consulted for assistance with disease management and care coordination needs.    Engaged with patient by telephone for initial visit in response to provider referral for pharmacy case management and/or care coordination services.   Consent to Services:  The patient was given the following information about Chronic Care Management services today, agreed to services, and gave verbal consent: 1. CCM service includes personalized support from designated clinical staff supervised by the primary care provider, including individualized plan of care and coordination with other care providers 2. 24/7 contact phone numbers for assistance for urgent and routine care needs. 3. Service will only be billed when office clinical staff spend 20 minutes or more in a month to coordinate care. 4. Only one practitioner may furnish and bill the service in a calendar month. 5.The patient may stop CCM services at any time (effective at the end of the month) by phone call to the office staff. 6. The patient will be responsible for cost sharing (co-pay) of up to 20% of the service fee (after annual deductible is met). Patient agreed to services and  consent obtained.  Patient Care Team: [name], MD as PCP - General (Family Medicine) [name], MD as PCP - Cardiology (Cardiology) [name], MD as PCP - Electrophysiology (Cardiology) [name], DO as Consulting Physician (Gastroenterology) Viona Gilmore, [facility] as Pharmacist (Pharmacist) Orson Slick, MD as Consulting Physician (Hematology and Oncology)  Recent office visits: 10/21/21 [name], LPN: Patient presented for AWV.  09/09/21 [name], MD - Patient presented for Itching. Prescribed Hydroxyzine Pamoate 25 mg. Stopped Empagliflozin 10 mg.  07/21/21 [name], MD - Patient presented for Acute on Chronic combined systolic and diastolic CHF and other concerns. Prescribed Trazodone  25-50 mg PRN.   Recent consult visits: 10/24/21 Diona Browner, NP (cardiology): Patient presented for pre-op CV exam  10/05/21 Gerrit Heck, DO (gastroenterology): Patient presented to discuss colonoscopy.  08/12/21 [name], MD (Cardiology) - Patient presented for CHF and other concerns. Prescribed Empagliflozin 10 mg daily.  06/08/21 [name], MD (Cardiology) - Patient presented for Hypertrophic Cardiomyopathy. Stopped Atorvastatin, Stopped Dicyclomine, Stopped Gabapentin, Stopped Hydroxychloroquine, Stopped Omeprazole. Increased Entresto to 49/51 twice daily   06/06/21 Sabino Niemann ([place]) - Patient presented for Inflammatory arthritis and other concerns. No medication changes.   04/29/21 [name] (Cardiology) - Patient presented for Other Cardiomyopathies. No other visit details available.   01/28/21 [name] (Cardiology) - Patient presented for [facility]. No other visit details available.   01/13/21 Magrinat, Virgie Dad, MD (Oncology) - Patient presented for Portal vein thrombosis and other concerns. No medication changes.  Hospital visits: Medication Reconciliation was completed by comparing discharge  summary,  patient's EMR and Pharmacy list, and upon discussion with patient.   Patient presented to [facility] on 07/06/21 due to Acute on chronic systolic Congestive Heart Failure. Patient was present for 4 days.   New?Medications Started at [facility] Discharge:?? -started  polyethylene glycol (MIRALAX / GLYCOLAX) sacubitril-valsartan (ENTRESTO   Medication Changes at Hospital Discharge: -Changed  None   Medications Discontinued at Hospital Discharge: -Stopped  Entresto 49-51 MG (sacubitril-valsartan)   Medications that remain the same after Hospital Discharge:??  -All other medications will remain the same.       Patient presented to [facility] on 01/17/21 due to finger laceration. Patient was present for 2 hours.   New?Medications Started at [facility] Discharge:?? -started  none   Medication Changes at Hospital Discharge: -Changed  None   Medications Discontinued at Hospital Discharge: -Stopped  none   Medications that remain the same after Hospital Discharge:??  -All other medications will remain the same.   Objective:  Lab Results  Component Value Date   CREATININE 1.40 (H) 08/19/2021   BUN 20 09/27/2021   GFR 29.07 (L) 07/21/2021   EGFR 39 (L) 08/19/2021   GFRNONAA 28 (L) 07/10/2021   GFRAA 40 (L) 03/31/2020   NA 141 09/27/2021   K 4.3 09/27/2021   CALCIUM 9.2 09/27/2021   CO2 27 (A) 09/27/2021   GLUCOSE 112 (H) 08/19/2021    Lab Results  Component Value Date/Time   HGBA1C 6.9 (H) 07/21/2021 11:15 AM   HGBA1C 6.0 (A) 01/14/2021 11:28 AM   HGBA1C 6.0 (H) 03/14/2020 04:58 PM   GFR 29.07 (L) 07/21/2021 11:15 AM   GFR 52.34 (L) 08/13/2019 10:27 AM   MICROALBUR 33.5 (H) 06/02/2015 10:07 AM   MICROALBUR 36.1 (H) 05/13/2014 02:34 PM    Last diabetic Eye exam:  Lab Results  Component Value Date/Time   HMDIABEYEEXA No Retinopathy 08/28/2017 12:00 AM    Last diabetic Foot exam:  Lab Results  Component Value Date/Time   HMDIABFOOTEX  yes 04/20/2010 12:00 AM     Lab Results  Component Value Date   CHOL 228 (H) 02/06/2019   HDL 91 02/06/2019   LDLCALC 109 (H) 02/06/2019   LDLDIRECT 195.2 02/15/2011   TRIG 165 (H) 02/06/2019   CHOLHDL 2.5 02/06/2019       Latest Ref Rng & Units 09/27/2021   12:00 AM 07/07/2021    3:57 AM 03/15/2020   12:58 AM  Hepatic Function  Total Protein 6.5 - 8.1 g/dL  6.8  6.4   Albumin 3.5 - 5.0 3.6     2.8  2.8   AST 15 - 41 U/L  14  11   ALT 0 - 44 U/L  17  11   Alk Phosphatase 38 - 126 U/L  71  80   Total Bilirubin 0.3 - 1.2 mg/dL  0.3  0.5      This result is from an external source.    Lab Results  Component Value Date/Time   TSH 5.23 (H) 03/31/2020 09:19 AM   TSH 0.01 (L) 11/24/2019 09:01 AM   FREET4 0.86 03/13/2018 06:35 PM   FREET4 1.16 (H) 05/22/2017 02:25 PM       Latest Ref Rng & Units 09/27/2021   12:00 AM 07/07/2021    3:57 AM 07/06/2021    7:37 PM  CBC  WBC 4.0 - 10.5 K/uL  7.6  8.1   Hemoglobin 12.0 - 16.0 12.4     11.2  11.7   Hematocrit 36.0 -  46.0 %  35.9  38.8   Platelets 150 - 400 K/uL  201  228      This result is from an external source.    No results found for: "VD25OH"  Clinical ASCVD: Yes  The 10-year ASCVD risk score (Arnett DK, et al., 2019) is: 73.1%   Values used to calculate the score:     Age: 48 years     Sex: Female     Is Non-Hispanic African American: Yes     Diabetic: Yes     Tobacco smoker: Yes     Systolic Blood Pressure: 024 mmHg     Is BP treated: Yes     HDL Cholesterol: 91 mg/dL     Total Cholesterol: 228 mg/dL       10/21/2021   10:26 AM 09/09/2021    8:53 AM 10/20/2020    2:41 PM  Depression screen PHQ 2/9  Decreased Interest 0 0 0  Down, Depressed, Hopeless 0 0 0  PHQ - 2 Score 0 0 0  Altered sleeping 0 3   Tired, decreased energy 0 0   Change in appetite 0 0   Feeling bad or failure about yourself  0 0   Trouble concentrating 0 0   Moving slowly or fidgety/restless 0 0   Suicidal thoughts 0 0   PHQ-9 Score 0 3    Difficult doing work/chores Not difficult at all Very difficult       Social History   Tobacco Use  Smoking Status Light Smoker   Packs/day: 0.25   Years: 40.00   Total pack years: 10.00   Types: Cigarettes  Smokeless Tobacco Never  Tobacco Comments   on and off,currently a pack / week ( 10/05/20)   Does 1 cigarette daily (10/05/2021)   BP Readings from Last 3 Encounters:  10/05/21 (!) 148/76  09/09/21 130/78  08/12/21 130/70   Pulse Readings from Last 3 Encounters:  10/05/21 84  09/09/21 70  08/12/21 84   Wt Readings from Last 3 Encounters:  10/21/21 225 lb (102.1 kg)  10/05/21 225 lb (102.1 kg)  09/09/21 222 lb (100.7 kg)   BMI Readings from Last 3 Encounters:  10/21/21 33.23 kg/m  10/05/21 33.23 kg/m  09/09/21 31.85 kg/m    Assessment/Interventions: Review of patient past medical history, allergies, medications, health status, including review of consultants reports, laboratory and other test data, was performed as part of comprehensive evaluation and provision of chronic care management services.   SDOH:  (Social Determinants of Health) assessments and interventions performed: Yes   SDOH Screenings   Alcohol Screen: Low Risk  (10/21/2021)   Alcohol Screen    Last Alcohol Screening Score (AUDIT): 2  Depression (PHQ2-9): Low Risk  (10/21/2021)   Depression (PHQ2-9)    PHQ-2 Score: 0  Financial Resource Strain: Low Risk  (10/21/2021)   Overall Financial Resource Strain (CARDIA)    Difficulty of Paying Living Expenses: Not very hard  Food Insecurity: No Food Insecurity (10/21/2021)   Hunger Vital Sign    Worried About Running Out of Food in the Last Year: Never true    Ran Out of Food in the Last Year: Never true  Housing: Low Risk  (10/21/2021)   Housing    Last Housing Risk Score: 0  Physical Activity: Insufficiently Active (10/21/2021)   Exercise Vital Sign    Days of Exercise per Week: 1 day    Minutes of Exercise per Session: 10 min  Social Connections:  Moderately  Integrated (10/21/2021)   Social Connection and Isolation Panel [NHANES]    Frequency of Communication with Friends and Family: More than three times a week    Frequency of Social Gatherings with Friends and Family: More than three times a week    Attends Religious Services: More than 4 times per year    Active Member of [employer] or Organizations: Yes    Attends Archivist Meetings: 1 to 4 times per year    Marital Status: Divorced  Stress: No Stress Concern Present (10/21/2021)   [employer] [facility] [place]    Feeling of Stress : Only a little  Tobacco Use: High Risk (10/24/2021)   Patient History    Smoking Tobacco Use: Light Smoker    Smokeless Tobacco Use: Never    Passive Exposure: Not on file  Transportation Needs: No Transportation Needs (10/21/2021)   PRAPARE - Transportation    Lack of Transportation (Medical): No    Lack of Transportation (Non-Medical): No   Patient reports the Xarelto and Delene Loll are both really expensive. Patient reports her copays went down with all medications with the switch the [facility] gold insurance.  Patient is living with son and daughter in law right now and enjoys this. She has her own space including a kitchen in her side of the house. Patient reports her old neighborhood was rough.   Patient's son buys her medications sometimes and him and his wife supplement her income a lot. They don't always tell her how much her medications are.  Patient denies any problems with her medications and denies having side effects with medications. Patient was worried that medications were causing problems with her kidneys. Patient is not sure what all of her medications are for and has a hard time keeping track of them all. She realized that she was not taking all of her medications daily as prescribed.  Patient reports she just stopped smoking 1 month ago and currently uses sugar free candy for  cravings. She quit cold [place] and this seemed to work for her.    CCM Care Plan  Allergies  Allergen Reactions   Jardiance [Empagliflozin] Itching   Ibuprofen Nausea Only and Other (See Comments)    Medications Reviewed Today     Reviewed by Lenna Sciara, NP (Nurse Practitioner) on 10/24/21 at 1039  Med List Status: <None>   Medication Order Taking? Sig Documenting Provider Last Dose Status Informant  ACCU-CHEK GUIDE test strip [id number] No USE TO CHECK BLOOD SUGAR DAILY [name], MD Taking Active Self  Accu-Chek Softclix Lancets lancets [id number] No USE TO CHECK BLOOD SUGAR DAILY [name], MD Taking Active Self  allopurinol (ZYLOPRIM) 100 MG tablet [id number] No Take 1 tablet (100 mg total) by mouth daily. [name], MD Taking Active   Blood Glucose Monitoring Suppl (ACCU-CHEK GUIDE) w/Device KIT [id number] No 1 each by Does not apply route daily. Use to check blood sugar daily [name], MD Taking Active   carvedilol (COREG) 25 MG tablet [id number] No Take 1 tablet (25 mg total) by mouth 2 (two) times daily with a meal. [name], MD Taking Active   dapagliflozin propanediol (FARXIGA) 10 MG TABS tablet [id number] No Take 1 tablet (10 mg total) by mouth daily before breakfast. [name], MD Taking Active   dicyclomine (BENTYL) 10 MG capsule [id number] No Take 10 mg by mouth every 6 (six) hours as needed for cramping. [provider] Taking Active  Self  furosemide (LASIX) 40 MG tablet [id number] No Take 1 tablet (40 mg total) by mouth 2 (two) times daily. [name], MD Taking Active   glipiZIDE (GLUCOTROL XL) 2.5 MG 24 hr tablet [id number] No Take 1 tablet (2.5 mg total) by mouth daily with breakfast. [name], MD Taking Active   hydrOXYzine (VISTARIL) 25 MG capsule [id number] No Take 1 capsule (25 mg total) by mouth every 6 (six) hours as needed for itching. [name], MD Taking Active   isosorbide mononitrate (IMDUR) 30 MG  24 hr tablet [id number] No Take 1 tablet (30 mg total) by mouth daily. [name], MD Taking Active   levothyroxine (SYNTHROID) 125 MCG tablet [id number] No TAKE 1 TABLET EVERY MORNING ON AN EMPTY STOMACH 30 MINUTES BEFORE TAKING OTHER MEDICATIONS OR BEFORE BREAKFAST [name], MD Taking Active   rivaroxaban (XARELTO) 20 MG TABS tablet [id number] No Take 1 tablet (20 mg total) by mouth daily with supper. Orson Slick, MD Taking Active   sacubitril-valsartan [facility]) 97-103 MG [id number] No Take 1 tablet by mouth 2 (two) times daily. [name], MD Taking Active   spironolactone (ALDACTONE) 50 MG tablet [id number] No Take 1 tablet (50 mg total) by mouth daily. [name], MD Taking Active   traZODone (DESYREL) 50 MG tablet [id number] No Take 0.5-1 tablets (25-50 mg total) by mouth at bedtime as needed for sleep. [name], MD Taking Active             Patient Active Problem List   Diagnosis Date Noted   Stage 3b chronic kidney disease (CKD) ([place]) 07/07/2021   DM2 (diabetes mellitus, type 2) ([place]) 07/07/2021   Acute on chronic systolic (congestive) heart failure ([place]) 07/06/2021   Diverticulosis of colon without hemorrhage    Multiple polyps of sigmoid colon    Polyp of ascending colon    Polyp of descending colon    Rectal polyp    Lower abdominal pain    Coagulopathy ([place]) 04/13/2020   Sigmoid diverticulitis 03/15/2020   Portal vein thrombosis 03/14/2020   Acute on chronic combined systolic and diastolic CHF (congestive heart failure) ([place]) 03/13/2018   Non-ischemic cardiomyopathy ([place])    Hypertension    Diabetes mellitus without complication ([place])    CAD (coronary artery disease) 06/18/2017   PAD (peripheral artery disease) ([place]) 06/18/2017   ICD (implantable cardioverter-defibrillator) in place 06/18/2017   Chronic combined systolic and diastolic CHF (congestive heart failure) ([place]) 05/22/2017   Hyperlipidemia associated with type 2 diabetes  mellitus ([place]) 12/11/2016   Treatment-emergent central sleep apnea 09/20/2016   NICM (nonischemic cardiomyopathy) ([place]) 10/08/2014   AICD (automatic cardioverter/defibrillator) present 10/08/2014   Cigarette smoker 10/30/2012   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   DeQuervain's disease (tenosynovitis) 09/01/2010   Atherosclerosis 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 ([place]) 12/07/2008   Diabetes ([place]) 10/29/2008   Hypothyroidism 01/31/2007   Essential hypertension 01/31/2007   GERD 01/31/2007    Immunization History  Administered Date(s) Administered   Fluad Quad(high Dose 65+) 12/24/2018, 12/15/2019, 01/14/2021   Influenza Split 02/15/2011   Influenza Whole 08/22/2004, 02/18/2007, 12/31/2008, 02/17/2010, 01/02/2012   Influenza, High Dose Seasonal PF 01/04/2015, 12/11/2016, 01/03/2018   Influenza,inj,Quad PF,6+ Mos 06/26/2013, 02/03/2014   Influenza-Unspecified 02/02/2016   Moderna Sars-Covid-2 Vaccination 04/18/2019, 05/16/2019, 02/03/2020, 09/14/2020, 01/04/2021   Pneumococcal Conjugate-13 06/26/2013   Pneumococcal Polysaccharide-23 08/22/2004, 02/03/2014   Td 12/22/2002   Tdap 06/26/2013, 01/18/2021   Zoster Recombinat (Shingrix) 10/22/2019,  01/02/2020   Zoster, Live 07/24/2014   -denied by [place]?   -What happened with Delene Loll and Xarelto apps?  Conditions to be addressed/monitored:  Hypertension, Hyperlipidemia, Diabetes, Heart Failure, Hypothyroidism, and Gout  Conditions addressed this visit: ***  There are no care plans that you recently modified to display for this patient.   Medication Assistance: Application for Xarelto and Entresto  medication assistance program. in process.  Anticipated assistance start date 08/26/21.  See plan of care for additional detail.  Compliance/Adherence/Medication fill history: Care Gaps: COVID booster, foot exam, eye exam BP- 148/76 ( 10/05/21)   Star-Rating Drugs: Glipizide 2.5 mg - Last filled  10/05/21 for 30 DS at [facility] Empagliflozin 10 mg - Last filled 08/12/21 for 30 DS at [facility]  Patient's preferred pharmacy is:  [facility] - [place], [place] - [address] [place] [address] Phone: [phone] Fax: [phone]  [place] - [address] [place] [address] Phone: [phone] Fax: [phone]  Uses pill box? Yes - weekly pill box (morning, noon, nighttime) Pt endorses 80% compliance   We discussed: Benefits of medication synchronization, packaging and delivery as well as enhanced pharmacist oversight with [facility]. Patient decided to: Utilize [facility] pharmacy for medication synchronization, packaging and delivery  Care Plan and Follow Up Patient Decision:  Patient agrees to Care Plan and Follow-up.  Plan: Telephone follow up appointment with care management team member scheduled for:  3 months  [name], PharmD, [place] Pharmacist [place] [facility] [phone]

## 2021-10-28 ENCOUNTER — Ambulatory Visit (INDEPENDENT_AMBULATORY_CARE_PROVIDER_SITE_OTHER): Payer: Medicare HMO

## 2021-10-28 ENCOUNTER — Telehealth: Payer: Medicare HMO

## 2021-10-28 DIAGNOSIS — I428 Other cardiomyopathies: Secondary | ICD-10-CM

## 2021-10-28 LAB — CUP PACEART REMOTE DEVICE CHECK
Battery Remaining Percentage: 80 %
Date Time Interrogation Session: 20230728073400
Implantable Lead Implant Date: 20160707
Implantable Lead Location: 753862
Implantable Lead Model: 3401
Implantable Pulse Generator Implant Date: 20210813
Pulse Gen Serial Number: 143555

## 2021-10-31 ENCOUNTER — Telehealth: Payer: Self-pay | Admitting: Pharmacist

## 2021-10-31 NOTE — Chronic Care Management (AMB) (Signed)
Chronic Care Management Pharmacy Assistant   Name: Paula Stein  MRN: [id number] DOB: 1946-03-12  Reason for Encounter: Medication Review Medication coordination   Recent office visits:  10/21/21 Paula Rung, LPN - Patient presented for Paula Stein Annual Wellness exam. Patient voiced goal of stopping smoking,  Recent consult visits:  10/24/21 Paula Grapes, NP (Cardiology) - Patient presented for Pre-op cardiovascular exam. No medication changes.  10/05/21 Paula Stein, Paula Dike, DO Paula Stein) - Patient presented for History of colonic polyps and other concerns. Prescribed Na sulfate   Hospital visits:  Medication Reconciliation was completed by comparing discharge summary, patient's EMR and Pharmacy list, and upon discussion with patient.   Patient presented to [facility] on 07/06/21 due to Acute on chronic systolic Congestive Heart Failure. Patient was present for 4 days.   New?Medications Started at [facility] Discharge:?? -started  polyethylene glycol (MIRALAX / GLYCOLAX) sacubitril-valsartan (ENTRESTO   Medication Changes at Hospital Discharge: -Changed  None   Medications Discontinued at Hospital Discharge: -Stopped  Entresto 49-51 MG (sacubitril-valsartan)   Medications that remain the same after Hospital Discharge:??  -All other medications will remain the same.       Patient presented to [facility] on 01/17/21 due to finger laceration. Patient was present for 2 hours.   New?Medications Started at [facility] Discharge:?? -started  none   Medication Changes at Hospital Discharge: -Changed  None   Medications Discontinued at Hospital Discharge: -Stopped  none   Medications that remain the same after Hospital Discharge:??  -All other medications will remain the same.    Medications: Outpatient Encounter Medications as of 10/31/2021  Medication Sig   ACCU-CHEK GUIDE test strip USE TO CHECK BLOOD SUGAR DAILY   Accu-Chek Softclix Lancets lancets  USE TO CHECK BLOOD SUGAR DAILY   allopurinol (ZYLOPRIM) 100 MG tablet Take 1 tablet (100 mg total) by mouth daily.   Blood Glucose Monitoring Suppl (ACCU-CHEK GUIDE) w/Device KIT 1 each by Does not apply route daily. Use to check blood sugar daily   carvedilol (COREG) 25 MG tablet Take 1 tablet (25 mg total) by mouth 2 (two) times daily with a meal.   dapagliflozin propanediol (FARXIGA) 10 MG TABS tablet Take 1 tablet (10 mg total) by mouth daily before breakfast.   dicyclomine (BENTYL) 10 MG capsule Take 10 mg by mouth every 6 (six) hours as needed for cramping.   furosemide (LASIX) 40 MG tablet Take 1 tablet (40 mg total) by mouth 2 (two) times daily.   glipiZIDE (GLUCOTROL XL) 2.5 MG 24 hr tablet Take 1 tablet (2.5 mg total) by mouth daily with breakfast.   hydrOXYzine (VISTARIL) 25 MG capsule Take 1 capsule (25 mg total) by mouth every 6 (six) hours as needed for itching.   isosorbide mononitrate (IMDUR) 30 MG 24 hr tablet Take 1 tablet (30 mg total) by mouth daily.   levothyroxine (SYNTHROID) 125 MCG tablet TAKE 1 TABLET EVERY MORNING ON AN EMPTY STOMACH 30 MINUTES BEFORE TAKING OTHER MEDICATIONS OR BEFORE BREAKFAST   rivaroxaban (XARELTO) 20 MG TABS tablet Take 1 tablet (20 mg total) by mouth daily with supper.   sacubitril-valsartan (ENTRESTO) 97-103 MG Take 1 tablet by mouth 2 (two) times daily.   spironolactone (ALDACTONE) 50 MG tablet Take 1 tablet (50 mg total) by mouth daily.   traZODone (DESYREL) 50 MG tablet Take 0.5-1 tablets (25-50 mg total) by mouth at bedtime as needed for sleep.   No facility-administered encounter medications on file as of 10/31/2021.  Reviewed chart for medication changes ahead of medication coordination call.  BP Readings from Last 3 Encounters:  10/05/21 (!) 148/76  09/09/21 130/78  08/12/21 130/70    Lab Results  Component Value Date   HGBA1C 6.9 (H) 07/21/2021    Reviewed chart prior to disease state call. Spoke with patient regarding  BP  Recent Office Vitals: BP Readings from Last 3 Encounters:  10/05/21 (!) 148/76  09/09/21 130/78  08/12/21 130/70   Pulse Readings from Last 3 Encounters:  10/05/21 84  09/09/21 70  08/12/21 84    Wt Readings from Last 3 Encounters:  10/21/21 225 lb (102.1 kg)  10/05/21 225 lb (102.1 kg)  09/09/21 222 lb (100.7 kg)     Kidney Function Lab Results  Component Value Date/Time   CREATININE 1.40 (H) 08/19/2021 09:40 AM   CREATININE 1.68 (H) 07/21/2021 11:15 AM   CREATININE 1.46 (H) 03/31/2020 09:19 AM   CREATININE 0.99 (H) 09/26/2017 12:00 AM   GFR 29.07 (L) 07/21/2021 11:15 AM   GFRNONAA 28 (L) 07/10/2021 03:32 AM   GFRNONAA 35 (L) 03/31/2020 09:19 AM   GFRAA 40 (L) 03/31/2020 09:19 AM       Latest Ref Rng & Units 09/27/2021   12:00 AM 08/19/2021    9:40 AM 07/21/2021   11:15 AM  BMP  Glucose 70 - 99 mg/dL  161  096   BUN 4 - 21 20     16  31    Creatinine 0.57 - 1.00 mg/dL  0.45  4.09   BUN/Creat Ratio 12 - 28  11    Sodium 137 - 147 141     140  140   Potassium 3.5 - 5.1 mEq/L 4.3     4.6  4.4   Chloride 99 - 108 108     105  104   CO2 13 - 22 27     23  28    Calcium 8.7 - 10.7 9.2     9.4  9.7      This result is from an external source.    Current antihypertensive regimen:  Entresto 97-103 mg 1 tablet twice daily - Appropriate, Query effective, Safe, Accessible Spironolactone 50 mg 1 tablet daily - Appropriate, Query effective, Safe, Accessible Carvedilol 25 mg 1 tablet twice daily - Appropriate, Query effective, Safe, Accessible   Notes: Per Paula Stein confirmed patient is taking her furosemide BID ad Isosorbide daily and what her weights at home have been like , unable to reach to complete assessment   Patient obtains medications through Adherence Packaging  30 Days   Last adherence delivery included Isosorbide Mono (IMDUR) 30 mg: Take one tab daily at [facility] Furosemide (Lasix) 40 mg: Take one tab at Breakfast and one at [facility]inner Allopurinol (  Zyloprim) 100 mg : Take one tab at Breakfast Carvedilol (Coreg) 25 mg: Take one tab at Breakfast and one at [employer] 97-103 mg: Take one tab at Breakfast and one at Dinner Xarelto 20 mg : Take one tab at Dinner Levothyroxine (Synthroid) 125 mcg: Take one tab Before Breakfast Spironolactone 50 mg: Take one tab at Dinner Glipizide 2.5 mg: Take one tab at Breakfast Farxiga 10 mg: Take one tab Before Breakfast    Unable to reach to confirm delivery date of 10/11/21, advised patient that pharmacy will contact them the morning of delivery.    Patient is due for next adherence delivery on: 11/10/21. Called patient and reviewed medications and coordinated delivery. Packs 30 DS  This  delivery to include: Isosorbide Mono (IMDUR) 30 mg: Take one tab daily at [facility]inner Furosemide (Lasix) 40 mg: Take one tab at Breakfast and one at [facility] Allopurinol ( Zyloprim) 100 mg : Take one tab at Breakfast Carvedilol (Coreg) 25 mg: Take one tab at Breakfast and one at [employer] 97-103 mg: Take one tab at Breakfast and one at Dinner Xarelto 20 mg : Take one tab at Dinner Levothyroxine (Synthroid) 125 mcg: Take one tab Before Breakfast Spironolactone 50 mg: Take one tab at Dinner Glipizide 2.5 mg: Take one tab at Breakfast Farxiga 10 mg: Take one tab Before Breakfast   Unable to reach to confirm delivery date of 11/10/21, left message for patient that pharmacy will contact them the morning of delivery.   Care Gaps: Foot Exam - Overdue Eye Exam - Overdue COVID Booster - Postponed CCM- 8/23 AWV- 7/23 Lab Results  Component Value Date   HGBA1C 6.9 (H) 07/21/2021    Star Rating Drugs: Glipizide 2.5 mg - Last filled 10/05/21 30 DS at [facility]    [facility] Clinical Pharmacist Assistant [phone]

## 2021-11-03 ENCOUNTER — Telehealth: Payer: Self-pay | Admitting: Hematology and Oncology

## 2021-11-03 ENCOUNTER — Other Ambulatory Visit: Payer: Self-pay | Admitting: Hematology and Oncology

## 2021-11-03 ENCOUNTER — Other Ambulatory Visit: Payer: Self-pay

## 2021-11-03 ENCOUNTER — Inpatient Hospital Stay: Payer: Medicare HMO | Attending: Hematology and Oncology | Admitting: Hematology and Oncology

## 2021-11-03 ENCOUNTER — Encounter (HOSPITAL_COMMUNITY): Payer: Self-pay | Admitting: Gastroenterology

## 2021-11-03 ENCOUNTER — Inpatient Hospital Stay: Payer: Medicare HMO

## 2021-11-03 VITALS — BP 154/87 | Temp 97.3°F | Resp 18 | Wt 221.7 lb

## 2021-11-03 DIAGNOSIS — D6852 Prothrombin gene mutation: Secondary | ICD-10-CM | POA: Insufficient documentation

## 2021-11-03 DIAGNOSIS — I81 Portal vein thrombosis: Secondary | ICD-10-CM | POA: Diagnosis not present

## 2021-11-03 DIAGNOSIS — Z720 Tobacco use: Secondary | ICD-10-CM | POA: Diagnosis not present

## 2021-11-03 DIAGNOSIS — Z7901 Long term (current) use of anticoagulants: Secondary | ICD-10-CM | POA: Diagnosis not present

## 2021-11-03 DIAGNOSIS — K5732 Diverticulitis of large intestine without perforation or abscess without bleeding: Secondary | ICD-10-CM

## 2021-11-03 LAB — CBC WITH DIFFERENTIAL (CANCER CENTER ONLY)
Abs Immature Granulocytes: 0.04 10*3/uL (ref 0.00–0.07)
Basophils Absolute: 0 10*3/uL (ref 0.0–0.1)
Basophils Relative: 0 %
Eosinophils Absolute: 0.1 10*3/uL (ref 0.0–0.5)
Eosinophils Relative: 1 %
HCT: 38 % (ref 36.0–46.0)
Hemoglobin: 12.2 g/dL (ref 12.0–15.0)
Immature Granulocytes: 1 %
Lymphocytes Relative: 29 %
Lymphs Abs: 2.3 10*3/uL (ref 0.7–4.0)
MCH: 31.2 pg (ref 26.0–34.0)
MCHC: 32.1 g/dL (ref 30.0–36.0)
MCV: 97.2 fL (ref 80.0–100.0)
Monocytes Absolute: 0.5 10*3/uL (ref 0.1–1.0)
Monocytes Relative: 7 %
Neutro Abs: 4.9 10*3/uL (ref 1.7–7.7)
Neutrophils Relative %: 62 %
Platelet Count: 251 10*3/uL (ref 150–400)
RBC: 3.91 MIL/uL (ref 3.87–5.11)
RDW: 14.6 % (ref 11.5–15.5)
WBC Count: 7.8 10*3/uL (ref 4.0–10.5)
nRBC: 0 % (ref 0.0–0.2)

## 2021-11-03 LAB — CMP (CANCER CENTER ONLY)
ALT: 7 U/L (ref 0–44)
AST: 10 U/L — ABNORMAL LOW (ref 15–41)
Albumin: 4.1 g/dL (ref 3.5–5.0)
Alkaline Phosphatase: 93 U/L (ref 38–126)
Anion gap: 8 (ref 5–15)
BUN: 33 mg/dL — ABNORMAL HIGH (ref 8–23)
CO2: 26 mmol/L (ref 22–32)
Calcium: 9.5 mg/dL (ref 8.9–10.3)
Chloride: 106 mmol/L (ref 98–111)
Creatinine: 1.69 mg/dL — ABNORMAL HIGH (ref 0.44–1.00)
GFR, Estimated: 31 mL/min — ABNORMAL LOW (ref >60)
Glucose, Bld: 139 mg/dL — ABNORMAL HIGH (ref 70–99)
Potassium: 3.8 mmol/L (ref 3.5–5.1)
Sodium: 140 mmol/L (ref 135–145)
Total Bilirubin: 0.5 mg/dL (ref 0.3–1.2)
Total Protein: 8.2 g/dL — ABNORMAL HIGH (ref 6.5–8.1)

## 2021-11-03 MED ORDER — RIVAROXABAN 10 MG PO TABS: 10.0000 mg | ORAL_TABLET | Freq: Every day | ORAL | 3 refills | Status: DC

## 2021-11-03 NOTE — Telephone Encounter (Signed)
Scheduled per 8/2 in basket, message has been left

## 2021-11-03 NOTE — Progress Notes (Signed)
[facility] [phone]   [phone]  PROGRESS NOTE  Patient Care Team: [name], MD as PCP - General (Family Medicine) [name], MD as PCP - Cardiology (Cardiology) [name], MD as PCP - Electrophysiology (Cardiology) [name], DO as Consulting Physician (Gastroenterology) Viona Gilmore, [facility] as Pharmacist (Pharmacist) Orson Slick, MD as Consulting Physician (Hematology and Oncology)  Hematological/Oncological History # Portal Vein Thrombosis Dec 2021: incident PVT noted during CT scan for diverticulitis 03/15/2020: JAK2, Prothrombin gene mutation, 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  03/17/2020: started Xarelto therapy.  01/13/2021: last visit with [name]  11/03/2021: transition care to Dr. Lorenso Courier   Interval History:  Paula Stein 76 y.o. female with medical history significant for port vein thrombosis who presents for a follow up visit. The patient's last visit was on 01/13/2021 with [name]. In the interim since the last visit she has continued on Xarelto therapy.  On exam today Paula Stein is curious as to why she has to continue anticoagulation therapy.  She reports that she is tolerating it well but the medication can be quite expensive.  She notes that she is currently in the donut hole and she is trying to avoid medications that she can help with.  She is currently tolerating 20 mg p.o. Xarelto well without any bleeding, bruising, or dark stools.  She notes that she has had no further abdominal pain or discomfort.  She had no other episodes of diverticulitis.  She notes she is not having any nausea, ming, or diarrhea.  A full 10 point ROS is listed below.  Today we had a detailed discussion about her portal vein thrombosis.  The details of this conversation are noted below.  MEDICAL HISTORY:  Past Medical History:  Diagnosis Date   AICD (automatic cardioverter/defibrillator) present 10/08/2014   SUBQ    /    DR  [name]   Cataract    CHF (congestive heart failure) (HCC)    Diabetes mellitus without complication (HCC)    GERD (gastroesophageal reflux disease)    History of cardiac cath 05/05/2007   normal-with patent coronaries   History of colonoscopy    History of hiatal hernia    Hyperlipidemia    Hypertension    Kidney failure    Stage 3   Non-ischemic cardiomyopathy (HCC)    EF 28%- reassessment of LV function 2011 with LVEf 45-50%   PAD (peripheral artery disease) (HCC)    lower extremities with ABIs of 0.5 bilaterally   Personal history of goiter    S/P radioactive iodine thyroid ablation    Sleep apnea    uses CPAP    SURGICAL HISTORY: Past Surgical History:  Procedure Laterality Date   BIOPSY  10/13/2020   Procedure: BIOPSY;  Surgeon: [name], DO;  Location: [facility];  Service: Gastroenterology;;   CATARACT EXTRACTION Bilateral    COLONOSCOPY WITH PROPOFOL N/A 10/13/2020   Procedure: COLONOSCOPY WITH PROPOFOL;  Surgeon: [name], DO;  Location: [facility];  Service: Gastroenterology;  Laterality: N/A;   EP IMPLANTABLE DEVICE N/A 10/08/2014   Procedure: SubQ ICD Implant;  Surgeon: [name], MD;  Location: [facility];  Service: Cardiovascular;  Laterality: N/A;   POLYPECTOMY  10/13/2020   Procedure: POLYPECTOMY;  Surgeon: [name], DO;  Location: [facility];  Service: Gastroenterology;;   [name] ICD CHANGEOUT N/A 11/14/2019   Procedure: XFGH ICD CHANGEOUT;  Surgeon: [name], MD;  Location: [facility]  INVASIVE CV [facility];  Service: Cardiovascular;  Laterality: N/A;   THYROIDECTOMY     TONSILLECTOMY     TOTAL ABDOMINAL HYSTERECTOMY      SOCIAL HISTORY: Social History   Socioeconomic History   Marital status: Divorced    Spouse name: Not on file   Number of children: 1   Years of education: Not on file   Highest education level: Some college, no degree  Occupational History   Occupation: Retired    Fish farm manager: RETIRED     Comment: Education officer, museum  Tobacco Use   Smoking status: Light Smoker    Packs/day: 0.25    Years: 40.00    Total pack years: 10.00    Types: Cigarettes   Smokeless tobacco: Never   Tobacco comments:    on and off,currently a pack / week ( 10/05/20)   Does 1 cigarette daily (10/05/2021)  Vaping Use   Vaping Use: Never used  Substance and Sexual Activity   Alcohol use: Yes    Alcohol/week: 0.0 standard drinks of alcohol    Comment: rare glass of wine   Drug use: No   Sexual activity: Not Currently    Birth control/protection: None  Other Topics Concern   Not on file  Social History Narrative   Retired Education officer, museum   Divorced - one grown son   current smoker    Alcohol use-no      Drug use-no    Regular exercise- no     son - [name]   Social Determinants of Health   Financial Resource Strain: Low Risk  (10/21/2021)   Overall Financial Resource Strain (CARDIA)    Difficulty of Paying Living Expenses: Not very hard  Food Insecurity: No Food Insecurity (10/21/2021)   Hunger Vital Sign    Worried About Running Out of Food in the Last Year: Never true    Ran Out of Food in the Last Year: Never true  Transportation Needs: No Transportation Needs (10/21/2021)   PRAPARE - Hydrologist (Medical): No    Lack of Transportation (Non-Medical): No  Physical Activity: Insufficiently Active (10/21/2021)   Exercise Vital Sign    Days of Exercise per Week: 1 day    Minutes of Exercise per Session: 10 min  Stress: No Stress Concern Present (10/21/2021)   [place]    Feeling of Stress : Only a little  Social Connections: Moderately Integrated (10/21/2021)   Social Connection and Isolation Panel [NHANES]    Frequency of Communication with Friends and Family: More than three times a week    Frequency of Social Gatherings with Friends and Family: More than three times a week    Attends Religious  Services: More than 4 times per year    Active Member of [employer] or Organizations: Yes    Attends Archivist Meetings: 1 to 4 times per year    Marital Status: Divorced  Human resources officer Violence: Not At Risk (10/21/2021)   Humiliation, Afraid, Rape, and Kick questionnaire    Fear of Current or Ex-Partner: No    Emotionally Abused: No    Physically Abused: No    Sexually Abused: No    FAMILY HISTORY: Family History  Problem Relation Age of Onset   Diabetes type II Mother    Hypertension Mother    Diabetes Mother    Heart disease Mother    Other Father        deceased  from accident age 66   Hyperlipidemia Father    Breast cancer Neg Hx    Stomach cancer Neg Hx    Colon cancer Neg Hx    Esophageal cancer Neg Hx    Pancreatic cancer Neg Hx     ALLERGIES:  is allergic to jardiance [empagliflozin] and ibuprofen.  MEDICATIONS:  Current Outpatient Medications  Medication Sig Dispense Refill   rivaroxaban (XARELTO) 10 MG TABS tablet Take 1 tablet (10 mg total) by mouth daily. 90 tablet 3   ACCU-CHEK GUIDE test strip USE TO CHECK BLOOD SUGAR DAILY 100 strip 1   Accu-Chek Softclix Lancets lancets USE TO CHECK BLOOD SUGAR DAILY 100 each 1   allopurinol (ZYLOPRIM) 100 MG tablet Take 1 tablet (100 mg total) by mouth daily. 90 tablet 1   Blood Glucose Monitoring Suppl (ACCU-CHEK GUIDE) w/Device KIT 1 each by Does not apply route daily. Use to check blood sugar daily 1 kit 0   carvedilol (COREG) 25 MG tablet Take 1 tablet (25 mg total) by mouth 2 (two) times daily with a meal. 180 tablet 3   dicyclomine (BENTYL) 10 MG capsule Take 10 mg by mouth every 6 (six) hours as needed for cramping.     furosemide (LASIX) 40 MG tablet Take 1 tablet (40 mg total) by mouth 2 (two) times daily. 180 tablet 3   glipiZIDE (GLUCOTROL XL) 2.5 MG 24 hr tablet Take 1 tablet (2.5 mg total) by mouth daily with breakfast. 90 tablet 1   hydrOXYzine (VISTARIL) 25 MG capsule Take 1 capsule (25 mg total) by  mouth every 6 (six) hours as needed for itching. 30 capsule 0   isosorbide mononitrate (IMDUR) 30 MG 24 hr tablet Take 1 tablet (30 mg total) by mouth daily. 90 tablet 3   levothyroxine (SYNTHROID) 125 MCG tablet TAKE 1 TABLET EVERY MORNING ON AN EMPTY STOMACH 30 MINUTES BEFORE TAKING OTHER MEDICATIONS OR BEFORE BREAKFAST 90 tablet 1   sacubitril-valsartan (ENTRESTO) 97-103 MG Take 1 tablet by mouth 2 (two) times daily. 180 tablet 3   spironolactone (ALDACTONE) 50 MG tablet Take 1 tablet (50 mg total) by mouth daily. 90 tablet 3   traZODone (DESYREL) 50 MG tablet Take 0.5-1 tablets (25-50 mg total) by mouth at bedtime as needed for sleep. 30 tablet 1   No current facility-administered medications for this visit.    REVIEW OF SYSTEMS:   Constitutional: ( - ) fevers, ( - )  chills , ( - ) night sweats Eyes: ( - ) blurriness of vision, ( - ) double vision, ( - ) watery eyes Ears, nose, mouth, throat, and face: ( - ) mucositis, ( - ) sore throat Respiratory: ( - ) cough, ( - ) dyspnea, ( - ) wheezes Cardiovascular: ( - ) palpitation, ( - ) chest discomfort, ( - ) lower extremity swelling Gastrointestinal:  ( - ) nausea, ( - ) heartburn, ( - ) change in bowel habits Skin: ( - ) abnormal skin rashes Lymphatics: ( - ) new lymphadenopathy, ( - ) easy bruising Neurological: ( - ) numbness, ( - ) tingling, ( - ) new weaknesses Behavioral/Psych: ( - ) mood change, ( - ) new changes  All other systems were reviewed with the patient and are negative.  PHYSICAL EXAMINATION:  Vitals:   11/03/21 1000  BP: (!) 154/87  Resp: 18  Temp: (!) 97.3 F (36.3 C)  SpO2: 99%   Filed Weights   11/03/21 1000  Weight: 221 lb 11.2 oz (  100.6 kg)    GENERAL: Well-appearing elderly African-American female, alert, no distress and comfortable SKIN: skin color, texture, turgor are normal, no rashes or significant lesions EYES: conjunctiva are pink and non-injected, sclera clear LUNGS: clear to auscultation and  percussion with normal breathing effort HEART: regular rate & rhythm and no murmurs and no lower extremity edema Musculoskeletal: no cyanosis of digits and no clubbing  PSYCH: alert & oriented x 3, fluent speech NEURO: no focal motor/sensory deficits  LABORATORY DATA:  I have reviewed the data as listed    Latest Ref Rng & Units 11/03/2021    9:38 AM 09/27/2021   12:00 AM 07/07/2021    3:57 AM  CBC  WBC 4.0 - 10.5 K/uL 7.8   7.6   Hemoglobin 12.0 - 15.0 g/dL 12.2  12.4     11.2   Hematocrit 36.0 - 46.0 % 38.0   35.9   Platelets 150 - 400 K/uL 251   201      This result is from an external source.       Latest Ref Rng & Units 11/03/2021    9:38 AM 09/27/2021   12:00 AM 08/19/2021    9:40 AM  CMP  Glucose 70 - 99 mg/dL 139   112   BUN 8 - 23 mg/dL 33  20     16   Creatinine 0.44 - 1.00 mg/dL 1.69   1.40   Sodium 135 - 145 mmol/L 140  141     140   Potassium 3.5 - 5.1 mmol/L 3.8  4.3     4.6   Chloride 98 - 111 mmol/L 106  108     105   CO2 22 - 32 mmol/L 26  27     23    Calcium 8.9 - 10.3 mg/dL 9.5  9.2     9.4   Total Protein 6.5 - 8.1 g/dL 8.2     Total Bilirubin 0.3 - 1.2 mg/dL 0.5     Alkaline Phos 38 - 126 U/L 93     AST 15 - 41 U/L 10     ALT 0 - 44 U/L 7        This result is from an external source.    RADIOGRAPHIC STUDIES: CUP PACEART REMOTE DEVICE CHECK  Result Date: 10/28/2021 Scheduled remote reviewed. Normal device function.  Next remote 91 days. LASensing Configuration: Alternate Gain Setting: 1X Post Shock Pacing: OFF   ASSESSMENT & PLAN KAMRI GOTSCH 75 y.o. female with medical history significant for port vein thrombosis who presents for a follow up visit.   Today we had a detailed discussion about her portal vein thrombosis.  This time it appears it was provoked by an episode of diverticulitis.  She has no mutations underlying the disease and therefore think a limited duration of anticoagulation would be reasonable.  However we cannot say for certain that  diverticulitis was the etiology of her VTE.  Is possible that the VTE was incidentally discovered during episode of diverticulitis.  The safest thing to do would be to continue anticoagulation assuming this is an unprovoked VTE.  The patient notes given the risks and benefits she would like to continue Xarelto therapy.  We discussed decreasing down to maintenance dose and she was delayed by the thought that we can decrease to Xarelto 10 mg p.o. daily.  #Port Vein Thrombosis in Setting of Diverticulitis -- At this time findings appear most consistent with a portal vein thrombosis potentially provoked  by an episode of diverticulitis. --Patient is currently on Xarelto 20 mg p.o. daily.  We discussed maintenance dosing with Xarelto 10 mg p.o. daily.  She noted she would like to continue with maintenance dosing.  We will put a prescription for this. -- At each visit will order CBC and CMP in order to assure the patient not having any occult bleeding or having any kidney or liver dysfunction --No evidence of hypercoagulation disorder on prior focused hypercoagulation work-up --Recommend return to clinic in 6 months time while on anticoagulation.  No orders of the defined types were placed in this encounter.   All questions were answered. The patient knows to call the clinic with any problems, questions or concerns.  A total of more than 40 minutes were spent on this encounter with face-to-face time and non-face-to-face time, including preparing to see the patient, ordering tests and/or medications, counseling the patient and coordination of care as outlined above.   Ledell Peoples, MD Department of Hematology/Oncology [place] [facility] Phone: [phone] Pager: [phone] Email: [email or web address]  11/03/2021 5:20 PM

## 2021-11-03 NOTE — Progress Notes (Signed)
Attempted to obtain medical history via telephone, unable to reach at this time. HIPAA compliant voicemail message left requesting return call to pre surgical testing department. 

## 2021-11-09 NOTE — Anesthesia Preprocedure Evaluation (Signed)
Anesthesia Evaluation  Patient identified by MRN, date of birth, ID band Patient awake    Reviewed: Allergy & Precautions, NPO status , Patient's Chart, lab work & pertinent test results, reviewed documented beta blocker date and time   History of Anesthesia Complications Negative for: history of anesthetic complications  Airway Mallampati: III  TM Distance: >3 FB Neck ROM: Full    Dental  (+) Edentulous Upper, Missing,    Pulmonary sleep apnea , Current Smoker and Patient abstained from smoking.,    Pulmonary exam normal        Cardiovascular hypertension, Pt. on medications and Pt. on home beta blockers + CAD, + Peripheral Vascular Disease and +CHF  Normal cardiovascular exam+ Cardiac Defibrillator   TTE 07/2021: EF 30-35%, global hypokinesis, mild LVH, grade I DD, RV systolic function moderately reduced, mild LAE, pericardial effusion posterior to LV   Neuro/Psych negative neurological ROS  negative psych ROS   GI/Hepatic Neg liver ROS, hiatal hernia, GERD  ,  Endo/Other  diabetes, Type 2, Oral Hypoglycemic AgentsHypothyroidism   Renal/GU Renal InsufficiencyRenal disease (Cr 1.69)  negative genitourinary   Musculoskeletal negative musculoskeletal ROS (+)   Abdominal   Peds  Hematology negative hematology ROS (+)   Anesthesia Other Findings Day of surgery medications reviewed with patient.  Reproductive/Obstetrics negative OB ROS                            Anesthesia Physical Anesthesia Plan  ASA: 3  Anesthesia Plan: MAC   Post-op Pain Management: Minimal or no pain anticipated   Induction:   PONV Risk Score and Plan: 1 and Treatment may vary due to age or medical condition and Propofol infusion  Airway Management Planned: Natural Airway and Nasal Cannula  Additional Equipment: None  Intra-op Plan:   Post-operative Plan:   Informed Consent: I have reviewed the patients  History and Physical, chart, labs and discussed the procedure including the risks, benefits and alternatives for the proposed anesthesia with the patient or authorized representative who has indicated his/her understanding and acceptance.       Plan Discussed with: CRNA  Anesthesia Plan Comments:        Anesthesia Quick Evaluation

## 2021-11-10 ENCOUNTER — Other Ambulatory Visit: Payer: Self-pay

## 2021-11-10 ENCOUNTER — Ambulatory Visit (HOSPITAL_BASED_OUTPATIENT_CLINIC_OR_DEPARTMENT_OTHER): Payer: Medicare HMO | Admitting: Certified Registered Nurse Anesthetist

## 2021-11-10 ENCOUNTER — Encounter (HOSPITAL_COMMUNITY)
Admission: RE | Disposition: A | Discharge status: Home / Self Care | Payer: Self-pay | Source: Home / Self Care | Attending: Gastroenterology

## 2021-11-10 ENCOUNTER — Encounter (HOSPITAL_COMMUNITY): Payer: Self-pay | Admitting: Gastroenterology

## 2021-11-10 ENCOUNTER — Ambulatory Visit (HOSPITAL_COMMUNITY): Payer: Medicare HMO | Admitting: Certified Registered Nurse Anesthetist

## 2021-11-10 ENCOUNTER — Ambulatory Visit (HOSPITAL_COMMUNITY)
Admission: RE | Admit: 2021-11-10 | Discharge: 2021-11-10 | Disposition: A | Discharge status: Home / Self Care | Payer: Medicare HMO | Attending: Gastroenterology | Admitting: Gastroenterology

## 2021-11-10 DIAGNOSIS — I2510 Atherosclerotic heart disease of native coronary artery without angina pectoris: Secondary | ICD-10-CM | POA: Insufficient documentation

## 2021-11-10 DIAGNOSIS — K635 Polyp of colon: Secondary | ICD-10-CM

## 2021-11-10 DIAGNOSIS — Z86010 Personal history of colonic polyps: Secondary | ICD-10-CM | POA: Diagnosis not present

## 2021-11-10 DIAGNOSIS — Z7901 Long term (current) use of anticoagulants: Secondary | ICD-10-CM | POA: Diagnosis not present

## 2021-11-10 DIAGNOSIS — Z09 Encounter for follow-up examination after completed treatment for conditions other than malignant neoplasm: Secondary | ICD-10-CM | POA: Diagnosis not present

## 2021-11-10 DIAGNOSIS — Z95810 Presence of automatic (implantable) cardiac defibrillator: Secondary | ICD-10-CM | POA: Diagnosis not present

## 2021-11-10 DIAGNOSIS — K621 Rectal polyp: Secondary | ICD-10-CM | POA: Diagnosis not present

## 2021-11-10 DIAGNOSIS — K5520 Angiodysplasia of colon without hemorrhage: Secondary | ICD-10-CM | POA: Insufficient documentation

## 2021-11-10 DIAGNOSIS — F17210 Nicotine dependence, cigarettes, uncomplicated: Secondary | ICD-10-CM | POA: Insufficient documentation

## 2021-11-10 DIAGNOSIS — Z79899 Other long term (current) drug therapy: Secondary | ICD-10-CM | POA: Insufficient documentation

## 2021-11-10 DIAGNOSIS — Z79890 Hormone replacement therapy (postmenopausal): Secondary | ICD-10-CM | POA: Diagnosis not present

## 2021-11-10 DIAGNOSIS — E1122 Type 2 diabetes mellitus with diabetic chronic kidney disease: Secondary | ICD-10-CM | POA: Diagnosis not present

## 2021-11-10 DIAGNOSIS — E1151 Type 2 diabetes mellitus with diabetic peripheral angiopathy without gangrene: Secondary | ICD-10-CM | POA: Diagnosis not present

## 2021-11-10 DIAGNOSIS — E039 Hypothyroidism, unspecified: Secondary | ICD-10-CM | POA: Diagnosis not present

## 2021-11-10 DIAGNOSIS — G4730 Sleep apnea, unspecified: Secondary | ICD-10-CM | POA: Insufficient documentation

## 2021-11-10 DIAGNOSIS — D120 Benign neoplasm of cecum: Secondary | ICD-10-CM | POA: Diagnosis not present

## 2021-11-10 DIAGNOSIS — Z1211 Encounter for screening for malignant neoplasm of colon: Secondary | ICD-10-CM | POA: Insufficient documentation

## 2021-11-10 DIAGNOSIS — K219 Gastro-esophageal reflux disease without esophagitis: Secondary | ICD-10-CM | POA: Diagnosis not present

## 2021-11-10 DIAGNOSIS — K5730 Diverticulosis of large intestine without perforation or abscess without bleeding: Secondary | ICD-10-CM

## 2021-11-10 DIAGNOSIS — Z7984 Long term (current) use of oral hypoglycemic drugs: Secondary | ICD-10-CM | POA: Insufficient documentation

## 2021-11-10 DIAGNOSIS — I509 Heart failure, unspecified: Secondary | ICD-10-CM | POA: Diagnosis not present

## 2021-11-10 DIAGNOSIS — K5790 Diverticulosis of intestine, part unspecified, without perforation or abscess without bleeding: Secondary | ICD-10-CM | POA: Diagnosis not present

## 2021-11-10 DIAGNOSIS — I130 Hypertensive heart and chronic kidney disease with heart failure and stage 1 through stage 4 chronic kidney disease, or unspecified chronic kidney disease: Secondary | ICD-10-CM | POA: Insufficient documentation

## 2021-11-10 DIAGNOSIS — N183 Chronic kidney disease, stage 3 unspecified: Secondary | ICD-10-CM | POA: Diagnosis not present

## 2021-11-10 HISTORY — PX: BIOPSY: SHX5522

## 2021-11-10 HISTORY — PX: COLONOSCOPY WITH PROPOFOL: SHX5780

## 2021-11-10 LAB — GLUCOSE, CAPILLARY: Glucose-Capillary: 122 mg/dL — ABNORMAL HIGH (ref 70–99)

## 2021-11-10 SURGERY — COLONOSCOPY WITH PROPOFOL
Anesthesia: Monitor Anesthesia Care

## 2021-11-10 MED ORDER — LIDOCAINE 2% (20 MG/ML) 5 ML SYRINGE
INTRAMUSCULAR | Status: DC | PRN
  Administered 2021-11-10: 20 mg via INTRAVENOUS

## 2021-11-10 MED ORDER — SODIUM CHLORIDE 0.9 % IV SOLN: INTRAVENOUS | Status: DC

## 2021-11-10 MED ORDER — PROPOFOL 1000 MG/100ML IV EMUL
INTRAVENOUS | Status: AC
  Filled 2021-11-10: qty 100

## 2021-11-10 MED ORDER — EPHEDRINE SULFATE-NACL 50-0.9 MG/10ML-% IV SOSY
PREFILLED_SYRINGE | INTRAVENOUS | Status: DC | PRN
  Administered 2021-11-10: 10 mg via INTRAVENOUS

## 2021-11-10 MED ORDER — ONDANSETRON HCL 4 MG/2ML IJ SOLN
INTRAMUSCULAR | Status: DC | PRN
  Administered 2021-11-10: 4 mg via INTRAVENOUS

## 2021-11-10 MED ORDER — LACTATED RINGERS IV SOLN
INTRAVENOUS | Status: AC | PRN
  Administered 2021-11-10: 20 mL/h via INTRAVENOUS

## 2021-11-10 MED ORDER — PROPOFOL 500 MG/50ML IV EMUL
INTRAVENOUS | Status: DC | PRN
  Administered 2021-11-10: 100 ug/kg/min via INTRAVENOUS

## 2021-11-10 SURGICAL SUPPLY — 22 items

## 2021-11-10 NOTE — Interval H&P Note (Signed)
History and Physical Interval Note:  11/10/2021 11:11 AM  Paula Stein  has presented today for surgery, with the diagnosis of hx of colon polyps.  The various methods of treatment have been discussed with the patient and family. After consideration of risks, benefits and other options for treatment, the patient has consented to  Procedure(s): COLONOSCOPY WITH PROPOFOL (N/A) as a surgical intervention.  The patient's history has been reviewed, patient examined, no change in status, stable for surgery.  I have reviewed the patient's chart and labs.  Questions were answered to the patient's satisfaction.     Dominic Pea [name]

## 2021-11-10 NOTE — Transfer of Care (Signed)
Immediate Anesthesia Transfer of Care Note  Patient: Paula Stein  Procedure(s) Performed: COLONOSCOPY WITH PROPOFOL BIOPSY  Patient Location: Endoscopy Unit  Anesthesia Type:MAC  Level of Consciousness: awake, alert  and patient cooperative  Airway & Oxygen Therapy: Patient Spontanous Breathing and Patient connected to face mask oxygen  Post-op Assessment: Report given to RN and Post -op Vital signs reviewed and stable  Post vital signs: Reviewed and stable  Last Vitals:  Vitals Value Taken Time  BP    Temp    Pulse    Resp 23 11/10/21 1204  SpO2    Vitals shown include unvalidated device data.  Last Pain:  Vitals:   11/10/21 1020  TempSrc: Temporal  PainSc: 0-No pain         Complications: No notable events documented.

## 2021-11-10 NOTE — H&P (Signed)
GASTROENTEROLOGY PROCEDURE H&P NOTE   Primary Care Physician: [name], MD    Reason for Procedure:  Colon polyp surveillance  Plan:    Colonoscopy  Patient is appropriate for endoscopic procedure(s) at [facility] Endoscopy unit.  The nature of the procedure, as well as the risks, benefits, and alternatives were carefully and thoroughly reviewed with the patient. Ample time for discussion and questions allowed. The patient understood, was satisfied, and agreed to proceed.     HPI: Paula Stein is a 76 y.o. female who presents for colonoscopy for ongoing polyp surveillance.  Due to elevated periprocedural risks from underlying comorbidities, procedure scheduled at [facility] Endoscopy unit today.  Holding Xarelto x 2 days for procedure today.  Endoscopic History: - Colonoscopy (08/22/2004, [facility]): No report available for review - Colonoscopy (10/2020): 6 subcentimeter polyps in the descending, ascending, cecum (path: Tubular adenomas x2, SSP x1).  Dense sigmoid diverticulosis with associated mucosal hypertrophy and erythema (path: Benign).  Many small rectal and sigmoid hyperplastic polyps.  15 mm rectal polyp (path: Tubular adenoma).  Extensive colonic looping.  2 medium sized AVMs in the ascending colon and cecum.  Recommended repeat in 1 year   Past Medical History:  Diagnosis Date   AICD (automatic cardioverter/defibrillator) present 10/08/2014   SUBQ    /    DR [name]   Cataract    CHF (congestive heart failure) (HCC)    Diabetes mellitus without complication (HCC)    GERD (gastroesophageal reflux disease)    History of cardiac cath 05/05/2007   normal-with patent coronaries   History of colonoscopy    History of hiatal hernia    Hyperlipidemia    Hypertension    Kidney failure    Stage 3   Non-ischemic cardiomyopathy (HCC)    EF 28%- reassessment of LV function 2011 with LVEf 45-50%   PAD (peripheral artery disease) (HCC)    lower  extremities with ABIs of 0.5 bilaterally   Personal history of goiter    S/P radioactive iodine thyroid ablation    Sleep apnea    uses CPAP    Past Surgical History:  Procedure Laterality Date   BIOPSY  10/13/2020   Procedure: BIOPSY;  Surgeon: [name], DO;  Location: [facility];  Service: Gastroenterology;;   CATARACT EXTRACTION Bilateral    COLONOSCOPY WITH PROPOFOL N/A 10/13/2020   Procedure: COLONOSCOPY WITH PROPOFOL;  Surgeon: [name], DO;  Location: [facility];  Service: Gastroenterology;  Laterality: N/A;   EP IMPLANTABLE DEVICE N/A 10/08/2014   Procedure: SubQ ICD Implant;  Surgeon: [name], MD;  Location: [place] [facility];  Service: Cardiovascular;  Laterality: N/A;   POLYPECTOMY  10/13/2020   Procedure: POLYPECTOMY;  Surgeon: [name], DO;  Location: [facility];  Service: Gastroenterology;;   [name] ICD CHANGEOUT N/A 11/14/2019   Procedure: NATF ICD CHANGEOUT;  Surgeon: [name], MD;  Location: [place] [facility];  Service: Cardiovascular;  Laterality: N/A;   THYROIDECTOMY     TONSILLECTOMY     TOTAL ABDOMINAL HYSTERECTOMY      Prior to Admission medications   Medication Sig Start Date End Date Taking? Authorizing Provider  ACCU-CHEK GUIDE test strip USE TO CHECK BLOOD SUGAR DAILY 03/17/21  Yes [name], MD  Accu-Chek Softclix Lancets lancets USE TO CHECK BLOOD SUGAR DAILY 03/17/21  Yes [name], MD  allopurinol (ZYLOPRIM) 100 MG tablet Take 1 tablet (100 mg total) by  mouth daily. 08/02/21  Yes [name], MD  Blood Glucose Monitoring Suppl (ACCU-CHEK GUIDE) w/Device KIT 1 each by Does not apply route daily. Use to check blood sugar daily 09/30/21  Yes [name], MD  Calcium Carb-Cholecalciferol (CALCIUM 600+D3 PO) Take 1 tablet by mouth daily.   Yes [provider]  carvedilol (COREG) 25 MG tablet Take 1 tablet (25 mg total) by mouth 2 (two) times daily with a meal. 08/01/21  Yes [name], MD  furosemide (LASIX) 40 MG tablet Take 1 tablet (40 mg total) by mouth 2 (two) times daily. 08/01/21  Yes [name], MD  glipiZIDE (GLUCOTROL XL) 2.5 MG 24 hr tablet Take 1 tablet (2.5 mg total) by mouth daily with breakfast. 08/02/21  Yes [name], MD  hydrOXYzine (VISTARIL) 25 MG capsule Take 1 capsule (25 mg total) by mouth every 6 (six) hours as needed for itching. 09/09/21  Yes [name], MD  isosorbide mononitrate (IMDUR) 30 MG 24 hr tablet Take 1 tablet (30 mg total) by mouth daily. 08/01/21  Yes [name], MD  levothyroxine (SYNTHROID) 125 MCG tablet TAKE 1 TABLET EVERY MORNING ON AN EMPTY STOMACH 30 MINUTES BEFORE TAKING OTHER MEDICATIONS OR BEFORE BREAKFAST 08/02/21  Yes [name], MD  rivaroxaban (XARELTO) 20 MG TABS tablet Take 20 mg by mouth daily with supper.   Yes [provider]  sacubitril-valsartan (ENTRESTO) 97-103 MG Take 1 tablet by mouth 2 (two) times daily. 08/01/21  Yes [name], MD  spironolactone (ALDACTONE) 50 MG tablet Take 1 tablet (50 mg total) by mouth daily. 08/01/21  Yes [name], MD  traZODone (DESYREL) 50 MG tablet Take 0.5-1 tablets (25-50 mg total) by mouth at bedtime as needed for sleep. 07/21/21  Yes [name], MD    Current Facility-Administered Medications  Medication Dose Route Frequency Provider Last Rate Last Admin   0.9 %  sodium chloride infusion   Intravenous Continuous Merilyn Pagan V, DO       lactated ringers infusion    Continuous PRN [place]arn[place] Reta V, DO 20 mL/hr at 11/10/21 1025 20 mL/hr at 11/10/21 1025    Allergies as of 10/05/2021 - Review Complete 10/05/2021  Allergen Reaction Noted   Jardiance [empagliflozin] Itching 09/09/2021   Ibuprofen Nausea Only and Other (See Comments) 10/08/2014    Family History  Problem Relation Age of Onset   Diabetes type II Mother    Hypertension Mother    Diabetes Mother    Heart disease Mother    Other Father        deceased from  accident age 19   Hyperlipidemia Father    Breast cancer Neg Hx    Stomach cancer Neg Hx    Colon cancer Neg Hx    Esophageal cancer Neg Hx    Pancreatic cancer Neg Hx     Social History   Socioeconomic History   Marital status: Divorced    Spouse name: Not on file   Number of children: 1   Years of education: Not on file   Highest education level: Some college, no degree  Occupational History   Occupation: Retired    Fish farm manager: RETIRED    Comment: Education officer, museum  Tobacco Use   Smoking status: Light Smoker    Packs/day: 0.25    Years: 40.00    Total pack years: 10.00    Types: Cigarettes   Smokeless tobacco: Never   Tobacco comments:  on and off,currently a pack / week ( 10/05/20)   Does 1 cigarette daily (10/05/2021)  Vaping Use   Vaping Use: Never used  Substance and Sexual Activity   Alcohol use: Yes    Alcohol/week: 0.0 standard drinks of alcohol    Comment: rare glass of wine   Drug use: No   Sexual activity: Not Currently    Birth control/protection: None  Other Topics Concern   Not on file  Social History Narrative   Retired Education officer, museum   Divorced - one grown son   current smoker    Alcohol use-no      Drug use-no    Regular exercise- no     son - Daziya Redmond   Social Determinants of Health   Financial Resource Strain: Low Risk  (10/21/2021)   Overall Financial Resource Strain (CARDIA)    Difficulty of Paying Living Expenses: Not very hard  Food Insecurity: No Food Insecurity (10/21/2021)   Hunger Vital Sign    Worried About Running Out of Food in the Last Year: Never true    Ran Out of Food in the Last Year: Never true  Transportation Needs: No Transportation Needs (10/21/2021)   PRAPARE - Hydrologist (Medical): No    Lack of Transportation (Non-Medical): No  Physical Activity: Insufficiently Active (10/21/2021)   Exercise Vital Sign    Days of Exercise per Week: 1 day    Minutes of Exercise per Session: 10 min   Stress: No Stress Concern Present (10/21/2021)   [place]    Feeling of Stress : Only a little  Social Connections: Moderately Integrated (10/21/2021)   Social Connection and Isolation Panel [NHANES]    Frequency of Communication with Friends and Family: More than three times a week    Frequency of Social Gatherings with Friends and Family: More than three times a week    Attends Religious Services: More than 4 times per year    Active Member of [employer] or Organizations: Yes    Attends Archivist Meetings: 1 to 4 times per year    Marital Status: Divorced  Intimate Partner Violence: Not At Risk (10/21/2021)   Humiliation, Afraid, Rape, and Kick questionnaire    Fear of Current or Ex-Partner: No    Emotionally Abused: No    Physically Abused: No    Sexually Abused: No    Physical Exam: Vital signs in last 24 hours: _0  (!) 163/62   Pulse 77   Temp 98.1 F (36.7 C) (Temporal)   Resp 16   Ht _1  (1.753 m)   Wt 100.7 kg   SpO2 97%   BMI 32.78 kg/m  GEN: NAD EYE: Sclerae anicteric ENT: MMM CV: Non-tachycardic Pulm: CTA b/l GI: Soft, NT/ND NEURO:  Alert & Oriented x 3   Gerrit Heck, DO [facility]   11/10/2021 10:43 AM

## 2021-11-10 NOTE — Discharge Instructions (Signed)

## 2021-11-10 NOTE — Anesthesia Procedure Notes (Signed)
Procedure Name: MAC Date/Time: 11/10/2021 11:24 AM  Performed by: West Pugh, CRNAPre-anesthesia Checklist: Patient identified, Emergency Drugs available, Suction available, Patient being monitored and Timeout performed Patient Re-evaluated:Patient Re-evaluated prior to induction Oxygen Delivery Method: Simple face mask Preoxygenation: Pre-oxygenation with 100% oxygen Dental Injury: Teeth and Oropharynx as per pre-operative assessment

## 2021-11-10 NOTE — Op Note (Signed)
[facility] Patient Name: Paula Stein Procedure Date: 11/10/2021 MRN: [id number] Attending MD: Gerrit Heck , MD Date of Birth: Sep 15, 1945 CSN: [id number] Age: 76 Admit Type: Outpatient Procedure:                Colonoscopy Indications:              High risk colon cancer surveillance: Personal                            history of multiple (3 or more) adenomas                           Last colonoscopy was 10/2020 and notable for 6                            subcentimeter polyps in the descending, ascending,                            cecum (path: Tubular adenomas x2, SSP x1). Dense                            sigmoid diverticulosis with associated mucosal                            hypertrophy and erythema (path: Benign). Many                            small rectal and sigmoid hyperplastic polyps. 15                            mm rectal polyp (path: Tubular adenoma). Extensive                            colonic looping. 2 medium sized AVMs in the                            ascending colon and cecum. Recommended repeat in 1                            year Providers:                Gerrit Heck, MD, Burtis Junes, RN, Darliss Cheney,                            Technician Referring MD:              Medicines:                Monitored Anesthesia Care Complications:            No immediate complications. Estimated Blood Loss:     Estimated blood loss was minimal. Procedure:                Pre-Anesthesia Assessment:                           - Prior to the procedure, a History and Physical  was performed, and patient medications and                            allergies were reviewed. The patient's tolerance of                            previous anesthesia was also reviewed. The risks                            and benefits of the procedure and the sedation                            options and risks were discussed with the patient.                             All questions were answered, and informed consent                            was obtained. Prior Anticoagulants: The patient has                            taken Xarelto (rivaroxaban), last dose was 2 days                            prior to procedure. ASA Grade Assessment: III - A                            patient with severe systemic disease. After                            reviewing the risks and benefits, the patient was                            deemed in satisfactory condition to undergo the                            procedure. An abdmoninal binder was placed prior to                            start of the procedure.                           After obtaining informed consent, the colonoscope                            was passed under direct vision. Throughout the                            procedure, the patient's blood pressure, pulse, and                            oxygen saturations were monitored continuously. The  CF-HQ190L ([id number]) Olympus colonoscope was                            introduced through the anus and advanced to the the                            terminal ileum. The colonoscopy was performed                            without difficulty. The patient tolerated the                            procedure well. The quality of the bowel                            preparation was good. The terminal ileum, ileocecal                            valve, appendiceal orifice, and rectum were                            photographed. Scope In: 11:32:39 AM Scope Out: 11:56:15 AM Total Procedure Duration: 0 hours 23 minutes 36 seconds  Findings:      The perianal and digital rectal examinations were normal.      A 5 mm polyp was found in the cecum. The polyp was sessile. The polyp       was removed with a cold snare. Resection and retrieval were complete.       Estimated blood loss was minimal.      Multiple small and  large-mouthed diverticula were found in the sigmoid       colon.      Multiple sessile polyps were found in the rectum and recto-sigmoid       colon. The polyps were 1 to 3 mm in size. Several of these polyps were       removed with a cold biopsy forceps. Resection and retrieval were       complete. Estimated blood loss was minimal.      Retroflexion in the rectum was not performed due to anatomy.      The terminal ileum appeared normal.      Two medium-sized angioectasias with typical arborization were found in       the ascending colon and in the cecum. Impression:               - One 5 mm polyp in the cecum, removed with a cold                            snare. Resected and retrieved.                           - Diverticulosis in the sigmoid colon.                           - Multiple 1 to 3 mm polyps in the rectum and at  the recto-sigmoid colon, removed with a cold biopsy                            forceps. Resected and retrieved.                           - The examined portion of the ileum was normal.                           - Two colonic angioectasias. Moderate Sedation:      Not Applicable - Patient had care per Anesthesia. Recommendation:           - Patient has a contact number available for                            emergencies. The signs and symptoms of potential                            delayed complications were discussed with the                            patient. Return to normal activities tomorrow.                            Written discharge instructions were provided to the                            patient.                           - Resume previous diet.                           - Continue present medications.                           - Resume Xarelto (rivaroxaban) at prior dose                            tomorrow.                           - Await pathology results.                           - Repeat colonoscopy for  surveillance based on                            pathology results. Procedure Code(s):        --- Professional ---                           [phone], Colonoscopy, flexible; with removal of                            tumor(s), polyp(s), or other lesion(s) by snare  technique                           45380, 59, Colonoscopy, flexible; with biopsy,                            single or multiple Diagnosis Code(s):        --- Professional ---                           Z86.010, Personal history of colonic polyps                           K63.5, Polyp of colon                           K62.1, Rectal polyp                           K55.20, Angiodysplasia of colon without hemorrhage                           K57.30, Diverticulosis of large intestine without                            perforation or abscess without bleeding CPT copyright 2019 American Medical Association. All rights reserved. The codes documented in this report are preliminary and upon coder review may  be revised to meet current compliance requirements. Gerrit Heck, MD 11/10/2021 12:04:46 PM Number of Addenda: 0

## 2021-11-10 NOTE — Anesthesia Postprocedure Evaluation (Signed)
Anesthesia Post Note  Patient: Paula Stein  Procedure(s) Performed: COLONOSCOPY WITH PROPOFOL BIOPSY     Patient location during evaluation: PACU Anesthesia Type: MAC Level of consciousness: awake and alert Pain management: pain level controlled Vital Signs Assessment: post-procedure vital signs reviewed and stable Respiratory status: spontaneous breathing, nonlabored ventilation and respiratory function stable Cardiovascular status: blood pressure returned to baseline Postop Assessment: no apparent nausea or vomiting Anesthetic complications: no   No notable events documented.  Last Vitals:  Vitals:   11/10/21 1202 11/10/21 1215  BP: (!) 100/35 (!) 114/57  Pulse:  79  Resp: (!) 23 20  Temp: 36.5 C   SpO2: 99% 93%    Last Pain:  Vitals:   11/10/21 1215  TempSrc:   PainSc: 0-No pain                 Marthenia Rolling

## 2021-11-11 LAB — SURGICAL PATHOLOGY

## 2021-11-16 NOTE — Progress Notes (Signed)
Remote ICD transmission.   

## 2021-11-21 ENCOUNTER — Telehealth: Payer: Self-pay | Admitting: Pharmacist

## 2021-11-21 NOTE — Chronic Care Management (AMB) (Signed)
    Chronic Care Management Pharmacy Assistant   Name: Paula Stein  MRN: [id number] DOB: 07/11/1945 11/21/21 APPOINTMENT REMINDER   Patient was reminded to have all medications, supplements and any blood glucose and blood pressure readings available for review with [name], Pharm. D, for telephone visit on 11/22/21 at 9.    Care Gaps: COVID Booster - Overdue Foot Exam - Overdue Eye Exam - Overdue Flu Vaccine - Postponed BP- 154/87 11/03/21 AWV- 7/23 Lab Results  Component Value Date   HGBA1C 6.9 (H) 07/21/2021    Star Rating Drug: Glipizide 2.5 mg - Last filled 11/08/21 30 DS at [facility]     Medications: Outpatient Encounter Medications as of 11/21/2021  Medication Sig   ACCU-CHEK GUIDE test strip USE TO CHECK BLOOD SUGAR DAILY   Accu-Chek Softclix Lancets lancets USE TO CHECK BLOOD SUGAR DAILY   allopurinol (ZYLOPRIM) 100 MG tablet Take 1 tablet (100 mg total) by mouth daily.   Blood Glucose Monitoring Suppl (ACCU-CHEK GUIDE) w/Device KIT 1 each by Does not apply route daily. Use to check blood sugar daily   Calcium Carb-Cholecalciferol (CALCIUM 600+D3 PO) Take 1 tablet by mouth daily.   carvedilol (COREG) 25 MG tablet Take 1 tablet (25 mg total) by mouth 2 (two) times daily with a meal.   furosemide (LASIX) 40 MG tablet Take 1 tablet (40 mg total) by mouth 2 (two) times daily.   glipiZIDE (GLUCOTROL XL) 2.5 MG 24 hr tablet Take 1 tablet (2.5 mg total) by mouth daily with breakfast.   hydrOXYzine (VISTARIL) 25 MG capsule Take 1 capsule (25 mg total) by mouth every 6 (six) hours as needed for itching.   isosorbide mononitrate (IMDUR) 30 MG 24 hr tablet Take 1 tablet (30 mg total) by mouth daily.   levothyroxine (SYNTHROID) 125 MCG tablet TAKE 1 TABLET EVERY MORNING ON AN EMPTY STOMACH 30 MINUTES BEFORE TAKING OTHER MEDICATIONS OR BEFORE BREAKFAST   rivaroxaban (XARELTO) 20 MG TABS tablet Take 20 mg by mouth daily with supper.   sacubitril-valsartan (ENTRESTO) 97-103 MG  Take 1 tablet by mouth 2 (two) times daily.   spironolactone (ALDACTONE) 50 MG tablet Take 1 tablet (50 mg total) by mouth daily.   traZODone (DESYREL) 50 MG tablet Take 0.5-1 tablets (25-50 mg total) by mouth at bedtime as needed for sleep.   No facility-administered encounter medications on file as of 11/21/2021.     [place] Clinical Pharmacist Assistant [phone]

## 2021-11-21 NOTE — Progress Notes (Signed)
Chronic Care Management Pharmacy Note  11/24/2021 Name:  Paula Stein MRN:  [id number] DOB:  [birth year]  Summary: Pt is having difficulty affording medications BP not at goal < 130/80 LDL not at goal < 55  Recommendations/Changes made from today's visit: -Recommended bringing BP cuff to cardiology appt this week -Recommend repeat lipid panel and high intensity statin therapy -Provided free trial cards for both Entresto and Xarelto while working on patient assistance   Plan: Scheduled PCP visit follow up Re-mailed Entresto PAP Follow up in 4 months  Subjective: Paula Stein is an 76 y.o. year old female who is a primary patient of Paula Ruddy, MD.  The CCM team was consulted for assistance with disease management and care coordination needs.    Engaged with patient by telephone for follow up visit in response to provider referral for pharmacy case management and/or care coordination services.   Consent to Services:  The patient was given information about Chronic Care Management services, agreed to services, and gave verbal consent prior to initiation of services.  Please see initial visit note for detailed documentation.   Patient Care Team: Paula Ruddy, MD as PCP - General (Family Medicine) Paula Mocha, MD as PCP - Cardiology (Cardiology) Paula Sprang, MD as PCP - Electrophysiology (Cardiology) Paula Bullion, DO as Consulting Physician (Gastroenterology) Paula Stein, [facility] as Pharmacist (Pharmacist) Paula Slick, MD as Consulting Physician (Hematology and Oncology)  Recent office visits: 10/21/21 Paula Arbour, LPN: Patient presented for AWV. Patient voiced goal of stopping smoking.  09/09/21 Paula Morale, MD - Patient presented for Itching. Prescribed Hydroxyzine Pamoate 25 mg. Stopped Empagliflozin 10 mg.  07/21/21 Paula Ruddy, MD - Patient presented for Acute on Chronic combined systolic and diastolic CHF and other concerns.  Prescribed Trazodone  25-50 mg PRN.   Recent consult visits: 11/10/21 Patient presented for colonoscopy.  11/03/21 Paula Milliner, MD (Oncology) - Patient presented for Portal vein thrombosis and other concerns. Decreased Xarelto to 10 mg daily for maintenance dosing. Removed Farxiga from medication list. Follow up in 6 months.  10/24/21 Paula Browner, NP (cardiology): Patient presented for pre-op CV exam.  10/05/21 Paula Heck, DO (gastroenterology): Patient presented to discuss colonoscopy.  08/12/21 Paula Mocha, MD (Cardiology) - Patient presented for CHF and other concerns. Prescribed Empagliflozin 10 mg daily.  06/08/21 Paula Sprang, MD (Cardiology) - Patient presented for Hypertrophic Cardiomyopathy. Stopped Atorvastatin, Stopped Dicyclomine, Stopped Gabapentin, Stopped Hydroxychloroquine, Stopped Omeprazole. Increased Entresto to 49/51 twice daily   06/06/21 Paula Stein ([place]) - Patient presented for Inflammatory arthritis and other concerns. No medication changes.   04/29/21 Paula Stein (Cardiology) - Patient presented for Other Cardiomyopathies. No other visit details available.   Hospital visits: Medication Reconciliation was completed by comparing discharge summary, patient's EMR and Pharmacy list, and upon discussion with patient.   Patient presented to [facility] on 07/06/21 due to Acute on chronic systolic Congestive Heart Failure. Patient was present for 4 days.   New?Medications Started at [facility] Discharge:?? -started  polyethylene glycol (MIRALAX / GLYCOLAX) sacubitril-valsartan (ENTRESTO   Medication Changes at Hospital Discharge: -Changed  None   Medications Discontinued at Hospital Discharge: -Stopped  Entresto 49-51 MG (sacubitril-valsartan)   Medications that remain the same after Hospital Discharge:??  -All other medications will remain the same.       Patient presented to [facility] on 01/17/21 due to finger  laceration. Patient was present for 2 hours.  New?Medications Started at [facility] Discharge:?? -started  none   Medication Changes at Hospital Discharge: -Changed  None   Medications Discontinued at Hospital Discharge: -Stopped  none   Medications that remain the same after Hospital Discharge:??  -All other medications will remain the same.   Objective:  Lab Results  Component Value Date   CREATININE 1.69 (H) 11/03/2021   BUN 33 (H) 11/03/2021   GFR 29.07 (L) 07/21/2021   EGFR 39 (L) 08/19/2021   GFRNONAA 31 (L) 11/03/2021   GFRAA 40 (L) 03/31/2020   NA 140 11/03/2021   K 3.8 11/03/2021   CALCIUM 9.5 11/03/2021   CO2 26 11/03/2021   GLUCOSE 139 (H) 11/03/2021    Lab Results  Component Value Date/Time   HGBA1C 6.9 (H) 07/21/2021 11:15 AM   HGBA1C 6.0 (A) 01/14/2021 11:28 AM   HGBA1C 6.0 (H) 03/14/2020 04:58 PM   GFR 29.07 (L) 07/21/2021 11:15 AM   GFR 52.34 (L) 08/13/2019 10:27 AM   MICROALBUR 33.5 (H) 06/02/2015 10:07 AM   MICROALBUR 36.1 (H) 05/13/2014 02:34 PM    Last diabetic Eye exam:  Lab Results  Component Value Date/Time   HMDIABEYEEXA No Retinopathy 08/28/2017 12:00 AM    Last diabetic Foot exam:  Lab Results  Component Value Date/Time   HMDIABFOOTEX yes 04/20/2010 12:00 AM     Lab Results  Component Value Date   CHOL 228 (H) 02/06/2019   HDL 91 02/06/2019   LDLCALC 109 (H) 02/06/2019   LDLDIRECT 195.2 02/15/2011   TRIG 165 (H) 02/06/2019   CHOLHDL 2.5 02/06/2019       Latest Ref Rng & Units 11/03/2021    9:38 AM 09/27/2021   12:00 AM 07/07/2021    3:57 AM  Hepatic Function  Total Protein 6.5 - 8.1 g/dL 8.2   6.8   Albumin 3.5 - 5.0 g/dL 4.1  3.6     2.8   AST 15 - 41 U/L 10   14   ALT 0 - 44 U/L 7   17   Alk Phosphatase 38 - 126 U/L 93   71   Total Bilirubin 0.3 - 1.2 mg/dL 0.5   0.3      This result is from an external source.    Lab Results  Component Value Date/Time   TSH 5.23 (H) 03/31/2020 09:19 AM   TSH 0.01 (L)  11/24/2019 09:01 AM   FREET4 0.86 03/13/2018 06:35 PM   FREET4 1.16 (H) 05/22/2017 02:25 PM       Latest Ref Rng & Units 11/03/2021    9:38 AM 09/27/2021   12:00 AM 07/07/2021    3:57 AM  CBC  WBC 4.0 - 10.5 K/uL 7.8   7.6   Hemoglobin 12.0 - 15.0 g/dL 12.2  12.4     11.2   Hematocrit 36.0 - 46.0 % 38.0   35.9   Platelets 150 - 400 K/uL 251   201      This result is from an external source.    No results found for: "VD25OH"  Clinical ASCVD: Yes  The 10-year ASCVD risk score (Arnett DK, et al., 2019) is: 83.9%   Values used to calculate the score:     Age: 76 years     Sex: Female     Is Non-Hispanic African American: Yes     Diabetic: Yes     Tobacco smoker: Yes     Systolic Blood Pressure: 010 mmHg     Is BP treated: Yes  HDL Cholesterol: 91 mg/dL     Total Cholesterol: 228 mg/dL       10/21/2021   10:26 AM 09/09/2021    8:53 AM 10/20/2020    2:41 PM  Depression screen PHQ 2/9  Decreased Interest 0 0 0  Down, Depressed, Hopeless 0 0 0  PHQ - 2 Score 0 0 0  Altered sleeping 0 3   Tired, decreased energy 0 0   Change in appetite 0 0   Feeling bad or failure about yourself  0 0   Trouble concentrating 0 0   Moving slowly or fidgety/restless 0 0   Suicidal thoughts 0 0   PHQ-9 Score 0 3   Difficult doing work/chores Not difficult at all Very difficult       Social History   Tobacco Use  Smoking Status Light Smoker   Packs/day: 0.25   Years: 40.00   Total pack years: 10.00   Types: Cigarettes  Smokeless Tobacco Never  Tobacco Comments   on and off,currently a pack / week ( 10/05/20)   Does 1 cigarette daily (10/05/2021)   BP Readings from Last 3 Encounters:  11/23/21 (!) 158/90  11/10/21 (!) 109/50  11/03/21 (!) 154/87   Pulse Readings from Last 3 Encounters:  11/23/21 70  11/10/21 79  10/05/21 84   Wt Readings from Last 3 Encounters:  11/23/21 218 lb 11 oz (99.2 kg)  11/10/21 222 lb (100.7 kg)  11/03/21 221 lb 11.2 oz (100.6 kg)   BMI Readings from  Last 3 Encounters:  11/23/21 32.29 kg/m  11/10/21 32.78 kg/m  11/03/21 32.74 kg/m    Assessment/Interventions: Review of patient past medical history, allergies, medications, health status, including review of consultants reports, laboratory and other test data, was performed as part of comprehensive evaluation and provision of chronic care management services.   SDOH:  (Social Determinants of Health) assessments and interventions performed: Yes   SDOH Screenings   Alcohol Screen: Low Risk  (10/21/2021)   Alcohol Screen    Last Alcohol Screening Score (AUDIT): 2  Depression (PHQ2-9): Low Risk  (10/21/2021)   Depression (PHQ2-9)    PHQ-2 Score: 0  Financial Resource Strain: Low Risk  (10/21/2021)   Overall Financial Resource Strain (CARDIA)    Difficulty of Paying Living Expenses: Not very hard  Food Insecurity: No Food Insecurity (10/21/2021)   Hunger Vital Sign    Worried About Running Out of Food in the Last Year: Never true    Ran Out of Food in the Last Year: Never true  Housing: Low Risk  (10/21/2021)   Housing    Last Housing Risk Score: 0  Physical Activity: Insufficiently Active (10/21/2021)   Exercise Vital Sign    Days of Exercise per Week: 1 day    Minutes of Exercise per Session: 10 min  Social Connections: Moderately Integrated (10/21/2021)   Social Connection and Isolation Panel [NHANES]    Frequency of Communication with Friends and Family: More than three times a week    Frequency of Social Gatherings with Friends and Family: More than three times a week    Attends Religious Services: More than 4 times per year    Active Member of [employer] or Organizations: Yes    Attends Archivist Meetings: 1 to 4 times per year    Marital Status: Divorced  Stress: No Stress Concern Present (10/21/2021)   [employer] [place]    Feeling of Stress : Only a little  Tobacco Use: High Risk (11/23/2021)   Patient  History    Smoking Tobacco Use: Light Smoker    Smokeless Tobacco Use: Never    Passive Exposure: Not on file  Transportation Needs: No Transportation Needs (10/21/2021)   PRAPARE - Transportation    Lack of Transportation (Medical): No    Lack of Transportation (Non-Medical): No      CCM Care Plan  Allergies  Allergen Reactions   Farxiga [Dapagliflozin] Rash   Ibuprofen Nausea Only   Jardiance [Empagliflozin] Itching    Medications Reviewed Today     Reviewed by [name] (Physician Assistant) on 11/23/21 at [place] List Status: <None>   Medication Order Taking? Sig Documenting Provider Last Dose Status Informant  ACCU-CHEK GUIDE test strip [id number] Yes USE TO CHECK BLOOD SUGAR DAILY Paula Ruddy, MD Taking Active Self  Accu-Chek Softclix Lancets lancets [id number] Yes USE TO CHECK BLOOD SUGAR DAILY Paula Ruddy, MD Taking Active Self  allopurinol (ZYLOPRIM) 100 MG tablet [id number] Yes Take 1 tablet (100 mg total) by mouth daily. Paula Ruddy, MD Taking Active Self  Blood Glucose Monitoring Suppl (ACCU-CHEK GUIDE) w/Device KIT [id number] Yes 1 each by Does not apply route daily. Use to check blood sugar daily Paula Ruddy, MD Taking Active Self  Calcium Carb-Cholecalciferol (CALCIUM 600+D3 PO) [id number] Yes Take 1 tablet by mouth daily. [provider] Taking Active Self  carvedilol (COREG) 25 MG tablet [id number] Yes Take 1 tablet (25 mg total) by mouth 2 (two) times daily with a meal. Paula Sprang, MD Taking Active Self  furosemide (LASIX) 40 MG tablet [id number] Yes Take 1 tablet (40 mg total) by mouth 2 (two) times daily. Paula Sprang, MD Taking Active Self  glipiZIDE (GLUCOTROL XL) 2.5 MG 24 hr tablet [id number] Yes Take 1 tablet (2.5 mg total) by mouth daily with breakfast. Paula Ruddy, MD Taking Active Self  isosorbide mononitrate (IMDUR) 30 MG 24 hr tablet [id number] Yes Take 1 tablet (30 mg total) by mouth daily. Paula Sprang,  MD Taking Active Self  levothyroxine (SYNTHROID) 125 MCG tablet [id number] Yes TAKE 1 TABLET EVERY MORNING ON AN EMPTY STOMACH 30 MINUTES BEFORE TAKING OTHER MEDICATIONS OR BEFORE BREAKFAST Paula Ruddy, MD Taking Active Self  rivaroxaban (XARELTO) 10 MG TABS tablet [id number] Yes Take 10 mg by mouth daily with supper. [provider] Taking Active Self  sacubitril-valsartan (ENTRESTO) 97-103 MG [id number] Yes Take 1 tablet by mouth 2 (two) times daily. Paula Sprang, MD Taking Active Self  spironolactone (ALDACTONE) 50 MG tablet [id number] Yes Take 1 tablet (50 mg total) by mouth daily. Paula Sprang, MD Taking Active Self  traZODone (DESYREL) 50 MG tablet [id number] Yes Take 0.5-1 tablets (25-50 mg total) by mouth at bedtime as needed for sleep. Paula Ruddy, MD Taking Active Self            Patient Active Problem List   Diagnosis Date Noted   History of colonic polyps    Cecal polyp    Stage 3b chronic kidney disease (CKD) ([place]) 07/07/2021   DM2 (diabetes mellitus, type 2) ([place]) 07/07/2021   Acute on chronic systolic (congestive) heart failure ([place]) 07/06/2021   Diverticulosis of colon without hemorrhage    Multiple polyps of sigmoid colon    Polyp of ascending colon    Polyp of descending colon    Rectal polyp    Lower abdominal pain    Coagulopathy ([place]) 04/13/2020  Sigmoid diverticulitis 03/15/2020   Portal vein thrombosis 03/14/2020   HFrEF (heart failure with reduced ejection fraction) ([place]) 03/13/2018   Non-ischemic cardiomyopathy ([place])    Hypertension    Diabetes mellitus without complication ([place])    CAD (coronary artery disease) 06/18/2017   PAD (peripheral artery disease) ([place]) 06/18/2017   ICD (implantable cardioverter-defibrillator) in place 06/18/2017   Chronic combined systolic and diastolic CHF (congestive heart failure) ([place]) 05/22/2017   Hyperlipidemia associated with type 2 diabetes mellitus ([place]) 12/11/2016   Treatment-emergent central  sleep apnea 09/20/2016   NICM (nonischemic cardiomyopathy) ([place]) 10/08/2014   AICD (automatic cardioverter/defibrillator) present 10/08/2014   Cigarette smoker 10/30/2012   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   DeQuervain's disease (tenosynovitis) 09/01/2010   Atherosclerosis 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 ([place]) 12/07/2008   Diabetes ([place]) 10/29/2008   Hypothyroidism 01/31/2007   Essential hypertension 01/31/2007   GERD 01/31/2007    Immunization History  Administered Date(s) Administered   Fluad Quad(high Dose 65+) 12/24/2018, 12/15/2019, 01/14/2021   Influenza Split 02/15/2011   Influenza Whole 08/22/2004, 02/18/2007, 12/31/2008, 02/17/2010, 01/02/2012   Influenza, High Dose Seasonal PF 01/04/2015, 12/11/2016, 01/03/2018   Influenza,inj,Quad PF,6+ Mos 06/26/2013, 02/03/2014   Influenza-Unspecified 02/02/2016   Moderna Sars-Covid-2 Vaccination 04/18/2019, 05/16/2019, 02/03/2020, 09/14/2020, 01/04/2021   Pneumococcal Conjugate-13 06/26/2013   Pneumococcal Polysaccharide-23 08/22/2004, 02/03/2014   Td 12/22/2002   Tdap 06/26/2013, 01/18/2021   Zoster Recombinat (Shingrix) 10/22/2019, 01/02/2020   Zoster, Live 07/24/2014   Patient reports she had a rash with both Jardiance and Farxiga and does not wish to try them again. She also reports they were very expensive for her.  Patient will discuss options with cardiology tomorrow.   Patient has been weighing herself about once a week and was going to the bathroom more often when she first started on the fluid pill but now she doesn't feel like it's working. Patient will discuss with cardiology tomorrow.  Conditions to be addressed/monitored:  Hypertension, Hyperlipidemia, Diabetes, Heart Failure, Hypothyroidism, and Gout  Conditions addressed this visit: Hypertension, heart failure, hyperlipidemia  Care Plan : CCM Pharmacist Care Plan  Updates made by Paula Stein, [place] since 11/24/2021 12:00 AM      Problem: Problem: Hypertension, Hyperlipidemia, Diabetes, Heart Failure, Hypothyroidism, and Gout      Long-Range Goal: Patient-Specific Goal   Start Date: 07/15/2021  Expected End Date: 07/16/2022  Recent Progress: On track  Priority: High  Note:   Current Barriers:  Unable to independently monitor therapeutic efficacy Unable to achieve control of cholesterol  Unable to self administer medications as prescribed  Pharmacist Clinical Goal(s):  Patient will achieve adherence to monitoring guidelines and medication adherence to achieve therapeutic efficacy achieve control of cholesterol as evidenced by next lipid panel  through collaboration with PharmD and provider.   Interventions: 1:1 collaboration with Paula Ruddy, MD regarding development and update of comprehensive plan of care as evidenced by provider attestation and co-signature Inter-disciplinary care team collaboration (see longitudinal plan of care) Comprehensive medication review performed; medication list updated in electronic medical record  Hypertension (BP goal <130/80) -Not ideally controlled -Current treatment: Entresto 97-103 mg 1 tablet twice daily - Appropriate, Query effective, Safe, Accessible Spironolactone 50 mg 1 tablet daily - Appropriate, Query effective, Safe, Accessible Carvedilol 25 mg 1 tablet twice daily - Appropriate, Query effective, Safe, Accessible -Medications previously tried: n/a  -Current home readings: 140s/80s  -Current dietary habits: uses Mrs. Dash -Current exercise habits: was doing chair exercises at the [facility] -Denies hypotensive/hypertensive  symptoms -Educated on BP goals and benefits of medications for prevention of heart attack, stroke and kidney damage; Daily salt intake goal < 2300 mg; Importance of home blood pressure monitoring; Proper BP monitoring technique; -Counseled to monitor BP at home at least weekly, document, and provide log at future appointments -Counseled  on diet and exercise extensively Recommended to continue current medication  Hyperlipidemia: (LDL goal < 55) -Uncontrolled -Current treatment: No medication -Medications previously tried: atorvastatin (cramping)  -Current dietary patterns: eating at home mostly -Current exercise habits: not consistently -Educated on Cholesterol goals;  Benefits of statin for ASCVD risk reduction; Importance of limiting foods high in cholesterol; -Counseled on diet and exercise extensively Recommended high intensity statin therapy.  CAD/PAD (Goal: prevent events) -Uncontrolled -Current treatment  Xarelto 10 mg 1 tablet daily - Appropriate, Effective, Safe, Query accessible Isosorbide mononitrate 30 mg 1 tablet daily  - Appropriate, Effective, Safe, Accessible -Medications previously tried: n/a  -Recommended taking isosorbide daily as prescribed.  Diabetes (A1c goal <7%) -Controlled -Current medications: Glipizide 2.5 mg 1 tablet daily - Appropriate, Effective, Query Safe, Accessible -Medications previously tried: none  -Current home glucose readings fasting glucose: 116 every day post prandial glucose: 120s  -Denies hypoglycemic/hyperglycemic symptoms -Current meal patterns:  breakfast:  eating around 9:30 - shredded wheat or cheerios and piece of toast and bacon lunch: usually skips or a snack - sandwich or salad dinner: 6:30-7:30 - pasta, chicken, shrimp, meatloaf; chicken breast with spinach snacks: n/a drinks: tea, water, zero sugar coke (one bottle every 3 days), lots of cranberry juice -Current exercise: not doing anything right now -Educated on A1c and blood sugar goals; Complications of diabetes including kidney damage, retinal damage, and cardiovascular disease; Benefits of routine self-monitoring of blood sugar; Carbohydrate counting and/or plate method -Counseled to check feet daily and get yearly eye exams -Counseled on diet and exercise extensively Recommended to continue  current medication Mail healthy plate handout.  Heart Failure (Goal: manage symptoms and prevent exacerbations) -Uncontrolled -Last ejection fraction: 07/07/21 (Date: 30-35%) -HF type: Combined Systolic and Diastolic -NYHA Class: II (slight limitation of activity) -AHA HF Stage: C (Heart disease and symptoms present) -Current treatment: Entresto 97-103 mg 1 tablet twice daily - Appropriate, Effective, Safe, Query accessible Spironolactone 50 mg 1 tablet daily - Appropriate, Effective, Safe, Accessible Furosemide 40 mg 1 tablet twice daily  - Appropriate, Query effective, Safe, Accessible Isosorbide mononitrate 30 mg 1 tablet daily - Appropriate, Effective, Safe, Accessible Carvedilol 25 mg 1 tablet twice daily - Appropriate, Effective, Safe, Accessible -Medications previously tried: [place] and Jardiance (rash) -Current home BP/HR readings: goes up and down - 180/72 down to 120/72 -Current dietary habits: eating out often; frozen vegetables more than canned -Current exercise habits: not consistently  -Educated on Benefits of medications for managing symptoms and prolonging life Importance of weighing daily; if you gain more than 3 pounds in one day or 5 pounds in one week, call cardiologist. Proper diuretic administration and potassium supplementation Importance of blood pressure control -Counseled on diet and exercise extensively Recommended to continue current medication Recommended discussing efficacy of furosemide with cardiology.  Portal vein thrombosis (Goal: prevent clots) -Controlled -Current treatment  Xarelto 20 mg 1 tablet daily with supper - Appropriate, Effective, Safe, Query accessible -Medications previously tried: none  -Recommended to continue current medication Assessed patient finances. Apply for PAP for Xarelto.  Gout (Goal: uric acid < 6 and prevent flare ups) -Uncontrolled -Current treatment  Allopurinol 100 mg 1 tablet daily as needed - Appropriate, Query  effective, Safe, Accessible -Medications previously tried: none  -Recommended to continue current medication Recommended taking every day for gout prevention.  Osteopenia (Goal prevent fractures) -Uncontrolled -Last DEXA Scan: 01/01/20   T-Score femoral neck: -1.4  T-Score total hip: n/a  T-Score lumbar spine: -1.1  T-Score forearm radius: n/a  10-year probability of major osteoporotic fracture: 5.0%  10-year probability of hip fracture: 1.5% -Patient is not a candidate for pharmacologic treatment -Current treatment  No medications -Medications previously tried: none  -Recommend [phone] units of vitamin D daily. Recommend 1200 mg of calcium daily from dietary and supplemental sources. Recommend weight-bearing and muscle strengthening exercises for building and maintaining bone density. -Recommended supplementing with calcium and vitamin D.   Hypothyroidism (Goal: 2.5-4.5) -Uncontrolled -Current treatment  Levothyroxine 125 mcg 1 tablet daily - Appropriate, Query effective, Safe, Accessible -Medications previously tried: none  -Recommended repeat TSH.  Health Maintenance -Vaccine gaps: none -Current therapy:  Miralax as needed Acetaminophen 500 mg 1 tablet as needed Dicyclomine 10 mg 1 capsule as needed -Educated on Cost vs benefit of each product must be carefully weighed by individual consumer -Patient is satisfied with current therapy and denies issues -Recommended to continue current medication  Patient Goals/Self-Care Activities Patient will:  - check glucose daily, document, and provide at future appointments check blood pressure a few times a week, document, and provide at future appointments weigh daily, and contact provider if weight gain of > 3 lbs in one day or > 5 lbs in one week. target a minimum of 150 minutes of moderate intensity exercise weekly  Follow Up Plan: The care management team will reach out to the patient again over the next 30 days.        Medication Assistance: Application for Xarelto and Entresto  medication assistance program. in process.  Anticipated assistance start date 08/26/21.  See plan of care for additional detail.  Compliance/Adherence/Medication fill history: Care Gaps: COVID booster, foot exam, eye exam, influenza vaccine BP- 154/87 11/03/21 A1c - 6.9% (07/21/21)   Star-Rating Drugs: Glipizide 2.5 mg - Last filled 11/08/21 30 DS at [facility] Empagliflozin 10 mg - Last filled 08/12/21 for 30 DS at [facility]  Patient's preferred pharmacy is:  [facility] - [place], [place] - [address] [place] [address] Phone: [phone] Fax: [phone]  [place] ([place] - [address] [place]) [place] [address] Phone: [phone] Fax: [phone]  Uses pill box? Yes - weekly pill box (morning, noon, nighttime) Pt endorses 80% compliance   We discussed: Benefits of medication synchronization, packaging and delivery as well as enhanced pharmacist oversight with [facility]. Patient decided to: Utilize [facility] pharmacy for medication synchronization, packaging and delivery  Care Plan and Follow Up Patient Decision:  Patient agrees to Care Plan and Follow-up.  Plan: Telephone follow up appointment with care management team member scheduled for:  4 months  [name], PharmD, [facility] [phone]

## 2021-11-22 ENCOUNTER — Ambulatory Visit: Payer: Medicare HMO | Admitting: Physician Assistant

## 2021-11-22 ENCOUNTER — Ambulatory Visit: Payer: Medicare HMO | Admitting: Pharmacist

## 2021-11-22 DIAGNOSIS — I5043 Acute on chronic combined systolic (congestive) and diastolic (congestive) heart failure: Secondary | ICD-10-CM

## 2021-11-22 DIAGNOSIS — E1142 Type 2 diabetes mellitus with diabetic polyneuropathy: Secondary | ICD-10-CM

## 2021-11-22 NOTE — Progress Notes (Unsigned)
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 Dorsey/hematology)  Prior CV Studies: ECHO COMPLETE WO IMAGING ENHANCING AGENT 07/07/2021 EF 30-35, global HK, mild LVH, G1 DD, moderately reduced RVSF, mild LAE, posterior pericardial effusion, trivial MR  Echo 08/12/2020 EF 30-35, global HK, moderately reduced RVSF, mild MR  Echo 06/04/2018 EF 30-35, mild conc LVH, Gr 2 DD, mod reduced RVSF, mod LAE, trivial eff, mild to mod MR, mod diff plaque in aortic arch   Echo 03/13/18 Mild LVH, EF 30-35, diff HK, Gr 3 DD, mild MR, severe LAE   Echo 05/22/17 Severe conc LVH, EF 25-30, Gr 1 DD, mild MR, severe LAE, reduced RVSF, trivial TR, PASP 24, trivial pericardial effusion   Cardiac Catheterization 05/13/2007 LAD prox 30, mid 20; Dx prox 50 RCA irregs, mid 30 EF 35, 2+ MR  History of Present Illness:   Paula Stein is a 76 y.o. female with the above problem list.  She was last seen by Dr. Burt Knack in May 2023.  She had recently been admitted for decompensated heart failure.  Dr. Burt Knack placed her on SGLT2 inhibitor.  She returns for follow-up.  She is here alone.  Overall, she has been doing well without chest pain, syncope, orthopnea, leg edema.  She has chronic shortness of breath with exertion that is unchanged.  She tried to take [place].  She had itching with Jardiance and rash with [place].  The cost would have also been  prohibitive at $600 a month.    Past Medical History:  Diagnosis Date   AICD (automatic cardioverter/defibrillator) present 10/08/2014   SUBQ    /    DR [name]   Cataract    CHF (congestive heart failure) (HCC)    Diabetes mellitus without complication (HCC)    GERD (gastroesophageal reflux disease)    History of cardiac cath 05/05/2007   normal-with patent coronaries   History of colonoscopy    History of hiatal hernia    Hyperlipidemia    Hypertension    Kidney failure    Stage 3   Non-ischemic cardiomyopathy (HCC)    EF 28%- reassessment of LV function 2011 with LVEf 45-50%   PAD (peripheral artery disease) (HCC)    lower extremities with ABIs of 0.5 bilaterally   Personal history of goiter    S/P radioactive iodine thyroid ablation    Sleep apnea    uses CPAP   Current Medications: Current Meds  Medication Sig   ACCU-CHEK GUIDE test strip USE TO CHECK BLOOD SUGAR DAILY   Accu-Chek Softclix Lancets lancets USE TO CHECK BLOOD SUGAR DAILY   allopurinol (ZYLOPRIM) 100 MG tablet Take 1 tablet (100 mg total) by mouth daily.   Blood Glucose Monitoring Suppl (ACCU-CHEK GUIDE) w/Device KIT 1 each by Does not apply route daily. Use to check blood sugar daily   Calcium Carb-Cholecalciferol (CALCIUM 600+D3 PO) Take 1 tablet by mouth daily.  carvedilol (COREG) 25 MG tablet Take 1 tablet (25 mg total) by mouth 2 (two) times daily with a meal.   furosemide (LASIX) 40 MG tablet Take 1 tablet (40 mg total) by mouth 2 (two) times daily.   glipiZIDE (GLUCOTROL XL) 2.5 MG 24 hr tablet Take 1 tablet (2.5 mg total) by mouth daily with breakfast.   isosorbide mononitrate (IMDUR) 30 MG 24 hr tablet Take 1 tablet (30 mg total) by mouth daily.   levothyroxine (SYNTHROID) 125 MCG tablet TAKE 1 TABLET EVERY MORNING ON AN EMPTY STOMACH 30 MINUTES BEFORE TAKING OTHER MEDICATIONS OR BEFORE BREAKFAST   rivaroxaban (XARELTO) 10 MG TABS tablet Take 10 mg by mouth daily with supper.   sacubitril-valsartan  (ENTRESTO) 97-103 MG Take 1 tablet by mouth 2 (two) times daily.   spironolactone (ALDACTONE) 50 MG tablet Take 1 tablet (50 mg total) by mouth daily.   traZODone (DESYREL) 50 MG tablet Take 0.5-1 tablets (25-50 mg total) by mouth at bedtime as needed for sleep.    Allergies:   Farxiga [dapagliflozin], Ibuprofen, and Jardiance [empagliflozin]   Social History   Tobacco Use   Smoking status: Light Smoker    Packs/day: 0.25    Years: 40.00    Total pack years: 10.00    Types: Cigarettes   Smokeless tobacco: Never   Tobacco comments:    on and off,currently a pack / week ( 10/05/20)   Does 1 cigarette daily (10/05/2021)  Vaping Use   Vaping Use: Never used  Substance Use Topics   Alcohol use: Yes    Alcohol/week: 0.0 standard drinks of alcohol    Comment: rare glass of wine   Drug use: No    Family Hx: The patient's family history includes Diabetes in her mother; Diabetes type II in her mother; Heart disease in her mother; Hyperlipidemia in her father; Hypertension in her mother; Other in her father. There is no history of Breast cancer, Stomach cancer, Colon cancer, Esophageal cancer, or Pancreatic cancer.  Review of Systems  Respiratory:  Negative for cough.      EKGs/Labs/Other Test Reviewed:    EKG:  EKG is not ordered today.  The ekg ordered today demonstrates n/a  Recent Labs: 07/06/2021: B Natriuretic Peptide 533.5 07/09/2021: Magnesium 2.4 07/21/2021: Pro B Natriuretic peptide (BNP) 96.0 11/03/2021: ALT 7; BUN 33; Creatinine 1.69; Hemoglobin 12.2; Platelet Count 251; Potassium 3.8; Sodium 140   Recent Lipid Panel No results for input(s): "CHOL", "TRIG", "HDL", "VLDL", "LDLCALC", "LDLDIRECT" in the last 8760 hours.   Risk Assessment/Calculations/Metrics:         HYPERTENSION CONTROL Vitals:   11/23/21 0903 11/23/21 1004  BP: (!) 160/80 (!) 158/90    The patient's blood pressure is elevated above target today.  In order to address the patient's elevated BP: Blood  pressure will be monitored at home to determine if medication changes need to be made.       Physical Exam:    VS:  BP (!) 158/90   Pulse 70   Ht 5' 9"  (1.753 m)   Wt 218 lb 11 oz (99.2 kg)   SpO2 98%   BMI 32.29 kg/m     Wt Readings from Last 3 Encounters:  11/23/21 218 lb 11 oz (99.2 kg)  11/10/21 222 lb (100.7 kg)  11/03/21 221 lb 11.2 oz (100.6 kg)    Constitutional:      Appearance: Healthy appearance. Not in distress.  Neck:     Vascular: JVD normal.  Pulmonary:  Effort: Pulmonary effort is normal.     Breath sounds: No wheezing. No rales.  Cardiovascular:     Normal rate. Regular rhythm. Normal S1. Normal S2.      Murmurs: There is no murmur.  Edema:    Peripheral edema absent.  Abdominal:     Palpations: Abdomen is soft.  Skin:    General: Skin is warm and dry.  Neurological:     Mental Status: Alert and oriented to person, place and time.         ASSESSMENT & PLAN:   HFrEF (heart failure with reduced ejection fraction) (HCC) EF 30-35 by echocardiogram April 2023.  NYHA IIb.  Volume status currently stable.  She had allergic reaction to both [place].  I do not think we should rechallenge her.  She also notes her cost would have been about $600 a month.  Continue furosemide 40 mg twice daily, carvedilol 25 mg twice daily, isosorbide mononitrate 30 mg daily, Entresto 97/103 mg twice daily, spironolactone 50 mg daily.  Follow-up in 6 months.  Essential hypertension Blood pressure uncontrolled.  She has not take any medications yet today.  She notes elevated readings at home as well.  However, these blood pressures are before she takes medications.  She also takes her morning medications closer to 11 and evening medications closer to 5.  I have asked her to try to space out her medications every 12 hours.  I have asked her to monitor blood pressure at home over the next 2 weeks and send those readings to me for review.  If her pressure remains above  target, consider adding hydralazine.  Continue spironolactone 50 mg daily, Entresto 97/103 mg twice daily, isosorbide mononitrate 30 mg daily, carvedilol 25 mg twice daily.  Portal vein thrombosis She remains on long-term anticoagulation with rivaroxaban.  She is followed by hematology.  CAD (coronary artery disease) Nonobstructive CAD by cardiac catheterization in 2009.  She she is not having anginal symptoms.    Stage 3b chronic kidney disease (CKD) (HCC) Recent creatinine stable at 1.69.  ICD (implantable cardioverter-defibrillator) in place Follow-up with the EP as planned.           Dispo:  Return in about 6 months (around 05/26/2022) for Routine Follow Up, w/ Dr. Burt Knack.   Medication Adjustments/Labs and Tests Ordered: Current medicines are reviewed at length with the patient today.  Concerns regarding medicines are outlined above.  Tests Ordered: No orders of the defined types were placed in this encounter.  Medication Changes: No orders of the defined types were placed in this encounter.  Signed, Richardson Dopp, PA-C  11/23/2021 10:13 AM    [address], [place], [place]  [address] Phone: [phone]; Fax: [phone]

## 2021-11-23 ENCOUNTER — Ambulatory Visit: Payer: Medicare HMO | Admitting: Physician Assistant

## 2021-11-23 ENCOUNTER — Encounter: Payer: Self-pay | Admitting: Physician Assistant

## 2021-11-23 VITALS — BP 158/90 | HR 70 | Ht 69.0 in | Wt 218.7 lb

## 2021-11-23 DIAGNOSIS — N1832 Chronic kidney disease, stage 3b: Secondary | ICD-10-CM

## 2021-11-23 DIAGNOSIS — I5020 Unspecified systolic (congestive) heart failure: Secondary | ICD-10-CM

## 2021-11-23 DIAGNOSIS — I10 Essential (primary) hypertension: Secondary | ICD-10-CM | POA: Diagnosis not present

## 2021-11-23 DIAGNOSIS — I2510 Atherosclerotic heart disease of native coronary artery without angina pectoris: Secondary | ICD-10-CM | POA: Diagnosis not present

## 2021-11-23 DIAGNOSIS — Z95810 Presence of automatic (implantable) cardiac defibrillator: Secondary | ICD-10-CM | POA: Diagnosis not present

## 2021-11-23 DIAGNOSIS — I81 Portal vein thrombosis: Secondary | ICD-10-CM | POA: Diagnosis not present

## 2021-11-23 NOTE — Assessment & Plan Note (Signed)
Nonobstructive CAD by cardiac catheterization in 2009.  She she is not having anginal symptoms.

## 2021-11-23 NOTE — Assessment & Plan Note (Signed)
Recent creatinine stable at 1.69.

## 2021-11-23 NOTE — Assessment & Plan Note (Signed)
Blood pressure uncontrolled.  She has not take any medications yet today.  She notes elevated readings at home as well.  However, these blood pressures are before she takes medications.  She also takes her morning medications closer to 11 and evening medications closer to 5.  I have asked her to try to space out her medications every 12 hours.  I have asked her to monitor blood pressure at home over the next 2 weeks and send those readings to me for review.  If her pressure remains above target, consider adding hydralazine.  Continue spironolactone 50 mg daily, Entresto 97/103 mg twice daily, isosorbide mononitrate 30 mg daily, carvedilol 25 mg twice daily.

## 2021-11-23 NOTE — Assessment & Plan Note (Signed)
Follow-up with the EP as planned.

## 2021-11-23 NOTE — Assessment & Plan Note (Signed)
She remains on long-term anticoagulation with rivaroxaban.  She is followed by hematology.

## 2021-11-23 NOTE — Assessment & Plan Note (Signed)
EF 30-35 by echocardiogram April 2023.  NYHA IIb.  Volume status currently stable.  She had allergic reaction to both [place].  I do not think we should rechallenge her.  She also notes her cost would have been about $600 a month.  Continue furosemide 40 mg twice daily, carvedilol 25 mg twice daily, isosorbide mononitrate 30 mg daily, Entresto 97/103 mg twice daily, spironolactone 50 mg daily.  Follow-up in 6 months.

## 2021-11-23 NOTE — Patient Instructions (Signed)
Medication Instructions:  Your physician recommends that you continue on your current medications as directed. Please refer to the Current Medication list given to you today, but try to spread your medications at least 12 hours apart  *If you need a refill on your cardiac medications before your next appointment, please call your pharmacy*   Lab Work: None ordered  If you have labs (blood work) drawn today and your tests are completely normal, you will receive your results only by: [facility] (if you have MyChart) OR A paper copy in the mail If you have any lab test that is abnormal or we need to change your treatment, we will call you to review the results.   Testing/Procedures: None ordered   Follow-Up: At [facility], you and your health needs are our priority.  As part of our continuing mission to provide you with exceptional heart care, we have created designated Provider Care Teams.  These Care Teams include your primary Cardiologist (physician) and Advanced Practice Providers (APPs -  Physician Assistants and Nurse Practitioners) who all work together to provide you with the care you need, when you need it.  We recommend signing up for the patient portal called "MyChart".  Sign up information is provided on this After Visit Summary.  MyChart is used to connect with patients for Virtual Visits (Telemedicine).  Patients are able to view lab/test results, encounter notes, upcoming appointments, etc.  Non-urgent messages can be sent to your provider as well.   To learn more about what you can do with MyChart, go to [email or web address].    Your next appointment:   6 month(s)  The format for your next appointment:   In Person  Provider:   [name], MD     Other Instructions Your physician has requested that you regularly monitor and record your blood pressure readings at home. Please use the same machine at the same time of day to check your readings and record  them to bring to your follow-up visit.   Please monitor blood pressures and keep a log of your readings and in 2 weeks send in a mychart message with the readings.   Make sure to check 2 hours after your medications.    AVOID these things for 30 minutes before checking your blood pressure: No Drinking caffeine. No Drinking alcohol. No Eating. No Smoking. No Exercising.   Five minutes before checking your blood pressure: Pee. Sit in a dining chair. Avoid sitting in a soft couch or armchair. Be quiet. Do not talk   Important Information About Sugar

## 2021-11-24 ENCOUNTER — Ambulatory Visit (HOSPITAL_COMMUNITY)
Admission: EM | Admit: 2021-11-24 | Discharge: 2021-11-24 | Disposition: A | Discharge status: Home / Self Care | Payer: Medicare HMO | Attending: Internal Medicine | Admitting: Internal Medicine

## 2021-11-24 ENCOUNTER — Other Ambulatory Visit: Payer: Self-pay

## 2021-11-24 ENCOUNTER — Encounter (HOSPITAL_COMMUNITY): Payer: Self-pay | Admitting: Emergency Medicine

## 2021-11-24 DIAGNOSIS — K130 Diseases of lips: Secondary | ICD-10-CM

## 2021-11-24 MED ORDER — AQUAPHOR EX OINT: TOPICAL_OINTMENT | CUTANEOUS | 0 refills | Status: DC | PRN

## 2021-11-24 NOTE — ED Triage Notes (Signed)
Pt reports waking up with lower lip swelling this morning. Denies any change in medication or change in foods. Denies any shortness of breath. States when she runs her tongue over her lower lip "it feels prickly" and reports having a "bump" like she bit her lip.

## 2021-11-24 NOTE — Discharge Instructions (Signed)
Apply Aquaphor ointment to your lips as needed.  If the swelling gets worse, you develop a scratchy itchy throat, or any other new or worsening symptoms, please return to urgent care. Be safe getting home!

## 2021-11-24 NOTE — ED Provider Notes (Signed)
[facility]    CSN: [id number] Arrival date & time: 11/24/21  1849      History   Chief Complaint Chief Complaint  Patient presents with   Lower Lip Swelling    HPI Paula Stein is a 76 y.o. female.   Patient presents to urgent care for evaluation of lower lip swelling that started this morning upon waking. Denies recent intake of new medications/foods or contact with new makeup. Denies known environmental and food allergies. No sore throat, scratchy/itchy throat, throat swelling, shortness of breath, chest pain, headache, heart palpitations, hives, rash, or fever/chills reported. States her lower right lip feels "prickly and itchy" and she feels a "bump" when she runs her tongue along the inner aspect of her lower lip. Suspects she may have bitten her lip without knowing. Says her son cooked some spaghetti last night that had "leaves" in it  that she had never seen before and wonders if this may have caused swelling. She cannot think of any other triggering factors for lip swelling. Had a checkup at her PCP yesterday without medication changes. She is not on an ace-inhibitor. Denies attempted use of medications prior to arrival at urgent care for lip swelling.      Past Medical History:  Diagnosis Date   AICD (automatic cardioverter/defibrillator) present 10/08/2014   SUBQ    /    DR [name]   Cataract    CHF (congestive heart failure) (HCC)    Diabetes mellitus without complication (HCC)    GERD (gastroesophageal reflux disease)    History of cardiac cath 05/05/2007   normal-with patent coronaries   History of colonoscopy    History of hiatal hernia    Hyperlipidemia    Hypertension    Kidney failure    Stage 3   Non-ischemic cardiomyopathy (HCC)    EF 28%- reassessment of LV function 2011 with LVEf 45-50%   PAD (peripheral artery disease) (HCC)    lower extremities with ABIs of 0.5 bilaterally   Personal history of goiter    S/P radioactive iodine thyroid  ablation    Sleep apnea    uses CPAP    Patient Active Problem List   Diagnosis Date Noted   History of colonic polyps    Cecal polyp    Stage 3b chronic kidney disease (CKD) ([place]) 07/07/2021   DM2 (diabetes mellitus, type 2) ([place]) 07/07/2021   Acute on chronic systolic (congestive) heart failure ([place]) 07/06/2021   Diverticulosis of colon without hemorrhage    Multiple polyps of sigmoid colon    Polyp of ascending colon    Polyp of descending colon    Rectal polyp    Lower abdominal pain    Coagulopathy ([place]) 04/13/2020   Sigmoid diverticulitis 03/15/2020   Portal vein thrombosis 03/14/2020   HFrEF (heart failure with reduced ejection fraction) ([place]) 03/13/2018   Non-ischemic cardiomyopathy ([place])    Hypertension    Diabetes mellitus without complication (HCC)    CAD (coronary artery disease) 06/18/2017   PAD (peripheral artery disease) ([place]) 06/18/2017   ICD (implantable cardioverter-defibrillator) in place 06/18/2017   Chronic combined systolic and diastolic CHF (congestive heart failure) ([place]) 05/22/2017   Hyperlipidemia associated with type 2 diabetes mellitus ([place]) 12/11/2016   Treatment-emergent central sleep apnea 09/20/2016   NICM (nonischemic cardiomyopathy) ([place]) 10/08/2014   AICD (automatic cardioverter/defibrillator) present 10/08/2014   Cigarette smoker 10/30/2012   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   DeQuervain's disease (tenosynovitis) 09/01/2010   Atherosclerosis of native artery  of extremity with intermittent claudication ([place]) 12/07/2008   Diabetes ([place]) 10/29/2008   Hypothyroidism 01/31/2007   Essential hypertension 01/31/2007   GERD 01/31/2007    Past Surgical History:  Procedure Laterality Date   BIOPSY  10/13/2020   Procedure: BIOPSY;  Surgeon: [name], DO;  Location: [facility];  Service: Gastroenterology;;   BIOPSY  11/10/2021   Procedure: BIOPSY;  Surgeon: [name], DO;  Location: [facility];  Service:  Gastroenterology;;   CATARACT EXTRACTION Bilateral    COLONOSCOPY WITH PROPOFOL N/A 10/13/2020   Procedure: COLONOSCOPY WITH PROPOFOL;  Surgeon: [name], DO;  Location: [facility];  Service: Gastroenterology;  Laterality: N/A;   COLONOSCOPY WITH PROPOFOL N/A 11/10/2021   Procedure: COLONOSCOPY WITH PROPOFOL;  Surgeon: [name], DO;  Location: [facility];  Service: Gastroenterology;  Laterality: N/A;   EP IMPLANTABLE DEVICE N/A 10/08/2014   Procedure: SubQ ICD Implant;  Surgeon: [name], MD;  Location: [facility];  Service: Cardiovascular;  Laterality: N/A;   POLYPECTOMY  10/13/2020   Procedure: POLYPECTOMY;  Surgeon: [name], DO;  Location: [facility];  Service: Gastroenterology;;   [name] ICD CHANGEOUT N/A 11/14/2019   Procedure: PIRJ ICD CHANGEOUT;  Surgeon: [name], MD;  Location: [facility];  Service: Cardiovascular;  Laterality: N/A;   THYROIDECTOMY     TONSILLECTOMY     TOTAL ABDOMINAL HYSTERECTOMY      OB History   No obstetric history on file.      Home Medications    Prior to Admission medications   Medication Sig Start Date End Date Taking? Authorizing Provider  mineral oil-hydrophilic petrolatum (AQUAPHOR) ointment Apply topically as needed for dry skin. 11/24/21  Yes Talbot Grumbling, FNP  ACCU-CHEK GUIDE test strip USE TO CHECK BLOOD SUGAR DAILY 03/17/21   [name], MD  Accu-Chek Softclix Lancets lancets USE TO CHECK BLOOD SUGAR DAILY 03/17/21   [name], MD  allopurinol (ZYLOPRIM) 100 MG tablet Take 1 tablet (100 mg total) by mouth daily. 08/02/21   [name], MD  Blood Glucose Monitoring Suppl (ACCU-CHEK GUIDE) w/Device KIT 1 each by Does not apply route daily. Use to check blood sugar daily 09/30/21   [name], MD  Calcium Carb-Cholecalciferol (CALCIUM 600+D3 PO) Take 1 tablet by mouth daily.    [provider]  carvedilol (COREG) 25 MG tablet Take 1 tablet (25 mg  total) by mouth 2 (two) times daily with a meal. 08/01/21   [name], MD  furosemide (LASIX) 40 MG tablet Take 1 tablet (40 mg total) by mouth 2 (two) times daily. 08/01/21   [name], MD  glipiZIDE (GLUCOTROL XL) 2.5 MG 24 hr tablet Take 1 tablet (2.5 mg total) by mouth daily with breakfast. 08/02/21   [name], MD  isosorbide mononitrate (IMDUR) 30 MG 24 hr tablet Take 1 tablet (30 mg total) by mouth daily. 08/01/21   [name], MD  levothyroxine (SYNTHROID) 125 MCG tablet TAKE 1 TABLET EVERY MORNING ON AN EMPTY STOMACH 30 MINUTES BEFORE TAKING OTHER MEDICATIONS OR BEFORE BREAKFAST 08/02/21   [name], MD  rivaroxaban (XARELTO) 10 MG TABS tablet Take 10 mg by mouth daily with supper.    [provider]  sacubitril-valsartan (ENTRESTO) 97-103 MG Take 1 tablet by mouth 2 (two) times daily. 08/01/21   [name], MD  spironolactone (ALDACTONE) 50 MG tablet Take 1 tablet (50 mg total) by  mouth daily. 08/01/21   [name], MD  traZODone (DESYREL) 50 MG tablet Take 0.5-1 tablets (25-50 mg total) by mouth at bedtime as needed for sleep. 07/21/21   [name], MD    Family History Family History  Problem Relation Age of Onset   Diabetes type II Mother    Hypertension Mother    Diabetes Mother    Heart disease Mother    Other Father        deceased from accident age 48   Hyperlipidemia Father    Breast cancer Neg Hx    Stomach cancer Neg Hx    Colon cancer Neg Hx    Esophageal cancer Neg Hx    Pancreatic cancer Neg Hx     Social History Social History   Tobacco Use   Smoking status: Light Smoker    Packs/day: 0.25    Years: 40.00    Total pack years: 10.00    Types: Cigarettes   Smokeless tobacco: Never   Tobacco comments:    on and off,currently a pack / week ( 10/05/20)   Does 1 cigarette daily (10/05/2021)  Vaping Use   Vaping Use: Never used  Substance Use Topics   Alcohol use: Yes    Alcohol/week: 0.0 standard drinks of alcohol     Comment: rare glass of wine   Drug use: No     Allergies   Farxiga [dapagliflozin], Ibuprofen, and Jardiance [empagliflozin]   Review of Systems Review of Systems Per HPI  Physical Exam Triage Vital Signs ED Triage Vitals  Enc Vitals Group     BP 11/24/21 1936 132/63     Pulse Rate 11/24/21 1936 76     Resp 11/24/21 1936 20     Temp 11/24/21 1936 97.7 F (36.5 C)     Temp Source 11/24/21 1936 Oral     SpO2 11/24/21 1936 100 %     Weight --      Height --      Head Circumference --      Peak Flow --      Pain Score 11/24/21 1935 0     Pain Loc --      Pain Edu? --      Excl. in [place]? --    No data found.  Updated Vital Signs BP 132/63 (BP Location: Right Arm)   Pulse 76   Temp 97.7 F (36.5 C) (Oral)   Resp 20   SpO2 100%   Visual Acuity Right Eye Distance:   Left Eye Distance:   Bilateral Distance:    Right Eye Near:   Left Eye Near:    Bilateral Near:     Physical Exam Vitals and nursing note reviewed.  Constitutional:      Appearance: Normal appearance. She is not ill-appearing or toxic-appearing.     Comments: Very pleasant patient sitting on exam in position of comfort table in no acute distress.   HENT:     Head: Normocephalic and atraumatic.     Right Ear: Hearing and external ear normal.     Left Ear: Hearing and external ear normal.     Nose: Nose normal.     Mouth/Throat:     Lips: Pink.     Mouth: Mucous membranes are moist.     Dentition: Normal dentition.     Tongue: No lesions.     Palate: No lesions.     Pharynx: Oropharynx is clear. Uvula midline. No pharyngeal swelling, oropharyngeal exudate  or posterior oropharyngeal erythema.     Tonsils: No tonsillar exudate.     Comments: Very mild swelling noted to the right lower lip.  Lips appear dry and cracked consistent with cheilitis.  No erythema or drainage to the lips. Small ulcerated lesion to the inner aspect of patient's oral mucosa of the lower lip to the right of her central bottom  tooth that is erythematous and nondraining. No other abnormality to the mouth.  Oral mucosa is moist.  Airway is intact. Eyes:     General: Lids are normal. Vision grossly intact. Gaze aligned appropriately.     Extraocular Movements: Extraocular movements intact.     Conjunctiva/sclera: Conjunctivae normal.  Cardiovascular:     Rate and Rhythm: Normal rate and regular rhythm.     Heart sounds: Normal heart sounds, S1 normal and S2 normal.  Pulmonary:     Effort: Pulmonary effort is normal. No respiratory distress.     Breath sounds: Normal breath sounds and air entry.  Abdominal:     Palpations: Abdomen is soft.  Musculoskeletal:     Cervical back: Neck supple.  Skin:    General: Skin is warm and dry.     Capillary Refill: Capillary refill takes less than 2 seconds.     Findings: No rash.  Neurological:     General: No focal deficit present.     Mental Status: She is alert and oriented to person, place, and time. Mental status is at baseline.     Cranial Nerves: No dysarthria or facial asymmetry.     Gait: Gait is intact.  Psychiatric:        Mood and Affect: Mood normal.        Speech: Speech normal.        Behavior: Behavior normal.        Thought Content: Thought content normal.        Judgment: Judgment normal.      UC Treatments / Results  Labs (all labs ordered are listed, but only abnormal results are displayed) Labs Reviewed - No data to display  EKG   Radiology No results found.  Procedures Procedures (including critical care time)  Medications Ordered in UC Medications - No data to display  Initial Impression / Assessment and Plan / UC Course  I have reviewed the triage vital signs and the nursing notes.  Pertinent labs & imaging results that were available during my care of the patient were reviewed by me and considered in my medical decision making (see chart for details).   1.  Cheilitis Patient's lip swelling is very mild and will likely resolve  on its own without intervention.  Her airway is intact and she is not in any acute respiratory distress.  Doubt allergic cause for patient's lip swelling.  Dryness and cracking to the right lateral lower lip consistent with cheilitis.  Advised patient to apply Aquaphor ointment to the lips throughout the day and increase water intake to at least 8 cups of water per day to maintain hydration.  She is clinically well-appearing and with hemodynamically stable vital signs.  Return to urgent care if symptoms do not improve in the next 24 to 48 hours.  Discussed physical exam and available lab work findings in clinic with patient.  Counseled patient regarding appropriate use of medications and potential side effects for all medications recommended or prescribed today. Discussed red flag signs and symptoms of worsening condition,when to call the PCP office, return to urgent care,  and when to seek higher level of care in the emergency department. Patient verbalizes understanding and agreement with plan. All questions answered. Patient discharged in stable condition.  Final Clinical Impressions(s) / UC Diagnoses   Final diagnoses:  Cheilitis     Discharge Instructions      Apply Aquaphor ointment to your lips as needed.  If the swelling gets worse, you develop a scratchy itchy throat, or any other new or worsening symptoms, please return to urgent care. Be safe getting home!     ED Prescriptions     Medication Sig Dispense Auth. Provider   mineral oil-hydrophilic petrolatum (AQUAPHOR) ointment Apply topically as needed for dry skin. 198 g Talbot Grumbling, FNP      PDMP not reviewed this encounter.   Talbot Grumbling, [place] 11/27/21 [phone]

## 2021-11-24 NOTE — Patient Instructions (Addendum)
Hi Paula Stein,  It was great to catch up again!  Please reach out to me if you have any questions or need anything before our follow up!  Best, Maddie  [name], PharmD, [facility]   Visit Information   Goals Addressed   None    Patient Care Plan: CCM Pharmacist Care Plan     Problem Identified: Problem: Hypertension, Hyperlipidemia, Diabetes, Heart Failure, Hypothyroidism, and Gout      Long-Range Goal: Patient-Specific Goal   Start Date: 07/15/2021  Expected End Date: 07/16/2022  Recent Progress: On track  Priority: High  Note:   Current Barriers:  Unable to independently monitor therapeutic efficacy Unable to achieve control of cholesterol  Unable to self administer medications as prescribed  Pharmacist Clinical Goal(s):  Patient will achieve adherence to monitoring guidelines and medication adherence to achieve therapeutic efficacy achieve control of cholesterol as evidenced by next lipid panel  through collaboration with PharmD and provider.   Interventions: 1:1 collaboration with [name], MD regarding development and update of comprehensive plan of care as evidenced by provider attestation and co-signature Inter-disciplinary care team collaboration (see longitudinal plan of care) Comprehensive medication review performed; medication list updated in electronic medical record  Hypertension (BP goal <130/80) -Not ideally controlled -Current treatment: Entresto 97-103 mg 1 tablet twice daily - Appropriate, Query effective, Safe, Accessible Spironolactone 50 mg 1 tablet daily - Appropriate, Query effective, Safe, Accessible Carvedilol 25 mg 1 tablet twice daily - Appropriate, Query effective, Safe, Accessible -Medications previously tried: n/a  -Current home readings: 140s/80s  -Current dietary habits: uses Mrs. Dash -Current exercise habits: was doing chair exercises at the [facility] -Denies  hypotensive/hypertensive symptoms -Educated on BP goals and benefits of medications for prevention of heart attack, stroke and kidney damage; Daily salt intake goal < 2300 mg; Importance of home blood pressure monitoring; Proper BP monitoring technique; -Counseled to monitor BP at home at least weekly, document, and provide log at future appointments -Counseled on diet and exercise extensively Recommended to continue current medication  Hyperlipidemia: (LDL goal < 55) -Uncontrolled -Current treatment: No medication -Medications previously tried: atorvastatin (cramping)  -Current dietary patterns: eating at home mostly -Current exercise habits: not consistently -Educated on Cholesterol goals;  Benefits of statin for ASCVD risk reduction; Importance of limiting foods high in cholesterol; -Counseled on diet and exercise extensively Recommended high intensity statin therapy.  CAD/PAD (Goal: prevent events) -Uncontrolled -Current treatment  Xarelto 10 mg 1 tablet daily - Appropriate, Effective, Safe, Query accessible Isosorbide mononitrate 30 mg 1 tablet daily  - Appropriate, Effective, Safe, Accessible -Medications previously tried: n/a  -Recommended taking isosorbide daily as prescribed.  Diabetes (A1c goal <7%) -Controlled -Current medications: Glipizide 2.5 mg 1 tablet daily - Appropriate, Effective, Query Safe, Accessible -Medications previously tried: none  -Current home glucose readings fasting glucose: 116 every day post prandial glucose: 120s  -Denies hypoglycemic/hyperglycemic symptoms -Current meal patterns:  breakfast:  eating around 9:30 - shredded wheat or cheerios and piece of toast and bacon lunch: usually skips or a snack - sandwich or salad dinner: 6:30-7:30 - pasta, chicken, shrimp, meatloaf; chicken breast with spinach snacks: n/a drinks: tea, water, zero sugar coke (one bottle every 3 days), lots of cranberry juice -Current exercise: not doing anything  right now -Educated on A1c and blood sugar goals; Complications of diabetes including kidney damage, retinal damage, and cardiovascular disease; Benefits of routine self-monitoring of blood sugar; Carbohydrate counting and/or plate method -Counseled to check feet  daily and get yearly eye exams -Counseled on diet and exercise extensively Recommended to continue current medication Mail healthy plate handout.  Heart Failure (Goal: manage symptoms and prevent exacerbations) -Uncontrolled -Last ejection fraction: 07/07/21 (Date: 30-35%) -HF type: Combined Systolic and Diastolic -NYHA Class: II (slight limitation of activity) -AHA HF Stage: C (Heart disease and symptoms present) -Current treatment: Entresto 97-103 mg 1 tablet twice daily - Appropriate, Effective, Safe, Query accessible Spironolactone 50 mg 1 tablet daily - Appropriate, Effective, Safe, Accessible Furosemide 40 mg 1 tablet twice daily  - Appropriate, Query effective, Safe, Accessible Isosorbide mononitrate 30 mg 1 tablet daily - Appropriate, Effective, Safe, Accessible Carvedilol 25 mg 1 tablet twice daily - Appropriate, Effective, Safe, Accessible -Medications previously tried: [place] and Jardiance (rash) -Current home BP/HR readings: goes up and down - 180/72 down to 120/72 -Current dietary habits: eating out often; frozen vegetables more than canned -Current exercise habits: not consistently  -Educated on Benefits of medications for managing symptoms and prolonging life Importance of weighing daily; if you gain more than 3 pounds in one day or 5 pounds in one week, call cardiologist. Proper diuretic administration and potassium supplementation Importance of blood pressure control -Counseled on diet and exercise extensively Recommended to continue current medication Recommended discussing efficacy of furosemide with cardiology.  Portal vein thrombosis (Goal: prevent clots) -Controlled -Current treatment  Xarelto 20 mg  1 tablet daily with supper - Appropriate, Effective, Safe, Query accessible -Medications previously tried: none  -Recommended to continue current medication Assessed patient finances. Apply for PAP for Xarelto.  Gout (Goal: uric acid < 6 and prevent flare ups) -Uncontrolled -Current treatment  Allopurinol 100 mg 1 tablet daily as needed - Appropriate, Query effective, Safe, Accessible -Medications previously tried: none  -Recommended to continue current medication Recommended taking every day for gout prevention.  Osteopenia (Goal prevent fractures) -Uncontrolled -Last DEXA Scan: 01/01/20   T-Score femoral neck: -1.4  T-Score total hip: n/a  T-Score lumbar spine: -1.1  T-Score forearm radius: n/a  10-year probability of major osteoporotic fracture: 5.0%  10-year probability of hip fracture: 1.5% -Patient is not a candidate for pharmacologic treatment -Current treatment  No medications -Medications previously tried: none  -Recommend [phone] units of vitamin D daily. Recommend 1200 mg of calcium daily from dietary and supplemental sources. Recommend weight-bearing and muscle strengthening exercises for building and maintaining bone density. -Recommended supplementing with calcium and vitamin D.   Hypothyroidism (Goal: 2.5-4.5) -Uncontrolled -Current treatment  Levothyroxine 125 mcg 1 tablet daily - Appropriate, Query effective, Safe, Accessible -Medications previously tried: none  -Recommended repeat TSH.  Health Maintenance -Vaccine gaps: none -Current therapy:  Miralax as needed Acetaminophen 500 mg 1 tablet as needed Dicyclomine 10 mg 1 capsule as needed -Educated on Cost vs benefit of each product must be carefully weighed by individual consumer -Patient is satisfied with current therapy and denies issues -Recommended to continue current medication  Patient Goals/Self-Care Activities Patient will:  - check glucose daily, document, and provide at future  appointments check blood pressure a few times a week, document, and provide at future appointments weigh daily, and contact provider if weight gain of > 3 lbs in one day or > 5 lbs in one week. target a minimum of 150 minutes of moderate intensity exercise weekly  Follow Up Plan: The care management team will reach out to the patient again over the next 30 days.        Patient verbalizes understanding of instructions and care plan provided today and  agrees to view in [place]. Active MyChart status and patient understanding of how to access instructions and care plan via MyChart confirmed with patient.    The pharmacy team will reach out to the patient again over the next 30 days.   Viona Gilmore, [facility]

## 2021-11-29 ENCOUNTER — Telehealth: Payer: Self-pay | Admitting: Pharmacist

## 2021-11-29 NOTE — Chronic Care Management (AMB) (Signed)
Chronic Care Management Pharmacy Assistant   Name: Paula Stein  MRN: [id number] DOB: Dec 06, 1945  Reason for Encounter: Medication Review Medication Coordination    Recent office visits:  None  Recent consult visits:  11/23/21 Paula Stein (Cardiology) - Patient presented for [facility] and other concerns. No medication changes.  Hospital visits:  Medication Reconciliation was completed by comparing discharge summary, patient's EMR and Pharmacy list, and upon discussion with patient.  Patient presented to [facility] on 11/24/21 due to Lower Lip Swelling. Patient was present for 55 min.  New?Medications Started at [facility] Discharge:?? -started  Aquaphor  Medication Changes at Hospital Discharge: -Changed  none  Medications Discontinued at Hospital Discharge: -Stopped  none  Medications that remain the same after Hospital Discharge:??  -All other medications will remain the same.     Patient presented to [facility] on 11/10/21 Colonoscopy with Propofol. Patient was present for 3 hours.  New?Medications Started at [facility] Discharge:?? -started  none  Medication Changes at Hospital Discharge: -Changed  none  Medications Discontinued at Hospital Discharge: -Stopped  none  Medications that remain the same after Hospital Discharge:??  -All other medications will remain the same.   Patient presented to [facility] on 07/06/21 due to Acute on chronic systolic Congestive Heart Failure. Patient was present for 4 days.   New?Medications Started at [facility] Discharge:?? -started  polyethylene glycol (MIRALAX / GLYCOLAX) sacubitril-valsartan (ENTRESTO   Medication Changes at Hospital Discharge: -Changed  None   Medications Discontinued at Hospital Discharge: -Stopped  Entresto 49-51 MG (sacubitril-valsartan)   Medications that remain the same after Hospital Discharge:??  -All other medications will remain the  same.   Medications: Outpatient Encounter Medications as of 11/29/2021  Medication Sig   ACCU-CHEK GUIDE test strip USE TO CHECK BLOOD SUGAR DAILY   Accu-Chek Softclix Lancets lancets USE TO CHECK BLOOD SUGAR DAILY   allopurinol (ZYLOPRIM) 100 MG tablet Take 1 tablet (100 mg total) by mouth daily.   Blood Glucose Monitoring Suppl (ACCU-CHEK GUIDE) w/Device KIT 1 each by Does not apply route daily. Use to check blood sugar daily   Calcium Carb-Cholecalciferol (CALCIUM 600+D3 PO) Take 1 tablet by mouth daily.   carvedilol (COREG) 25 MG tablet Take 1 tablet (25 mg total) by mouth 2 (two) times daily with a meal.   furosemide (LASIX) 40 MG tablet Take 1 tablet (40 mg total) by mouth 2 (two) times daily.   glipiZIDE (GLUCOTROL XL) 2.5 MG 24 hr tablet Take 1 tablet (2.5 mg total) by mouth daily with breakfast.   isosorbide mononitrate (IMDUR) 30 MG 24 hr tablet Take 1 tablet (30 mg total) by mouth daily.   levothyroxine (SYNTHROID) 125 MCG tablet TAKE 1 TABLET EVERY MORNING ON AN EMPTY STOMACH 30 MINUTES BEFORE TAKING OTHER MEDICATIONS OR BEFORE BREAKFAST   mineral oil-hydrophilic petrolatum (AQUAPHOR) ointment Apply topically as needed for dry skin.   rivaroxaban (XARELTO) 10 MG TABS tablet Take 10 mg by mouth daily with supper.   sacubitril-valsartan (ENTRESTO) 97-103 MG Take 1 tablet by mouth 2 (two) times daily.   spironolactone (ALDACTONE) 50 MG tablet Take 1 tablet (50 mg total) by mouth daily.   traZODone (DESYREL) 50 MG tablet Take 0.5-1 tablets (25-50 mg total) by mouth at bedtime as needed for sleep.   No facility-administered encounter medications on file as of 11/29/2021.  Reviewed chart for medication changes ahead of medication coordination call.  BP Readings from Last 3 Encounters:  11/24/21 132/63  11/23/21 (!) 158/90  11/10/21 (!) 109/50    Lab Results  Component Value Date   HGBA1C 6.9 (H) 07/21/2021     Patient obtains medications through Adherence Packaging  30 Days    Last adherence delivery included:  Isosorbide Mono (IMDUR) 30 mg: Take one tab daily at [facility] Furosemide (Lasix) 40 mg: Take one tab at Breakfast and one at Dinner Allopurinol ( Zyloprim) 100 mg : Take one tab at Breakfast Carvedilol (Coreg) 25 mg: Take one tab at Breakfast and one at [facility] 97-103 mg: Take one tab at Breakfast and one at [place] 20 mg : Take one tab at Dinner Levothyroxine (Synthroid) 125 mcg: Take one tab Before Breakfast Spironolactone 50 mg: Take one tab at Dinner Glipizide 2.5 mg: Take one tab at Breakfast Farxiga 10 mg: Take one tab Before Breakfast     Unable to reach to confirm delivery date of 11/10/21, left message for patient that pharmacy will contact them the morning of delivery.    Patient is due for next adherence delivery on: 12/09/21. Called patient and reviewed medications and coordinated delivery. Packs 30 DS  This delivery to include: Isosorbide Mono (IMDUR) 30 mg: Take one tab daily at Dinner Furosemide (Lasix) 40 mg: Take one tab at Breakfast and one at Dinner Allopurinol ( Zyloprim) 100 mg : Take one tab at Breakfast Carvedilol (Coreg) 25 mg: Take one tab at Breakfast and one at [employer] 97-103 mg: Take one tab at Breakfast and one at [place] 10 mg : Take one tab at [employer] (pt informed of decrease) Levothyroxine (Synthroid) 125 mcg: Take one tab Before Breakfast Spironolactone 50 mg: Take one tab at Dinner Glipizide 2.5 mg: Take one tab at Breakfast  Confirmed delivery date of 12/09/21, advised patient that pharmacy will contact them the morning of delivery.   Care Gaps: COVID Booster - Overdue Foot Exam - Overdue Eye Exam - Overdue Flu Vaccine - Postponed CCM- 12/23 Per Pharm note to follow up in 4 mos BP- 132/63 11/24/21 AWV- 7/23 Lab Results  Component Value Date   HGBA1C 6.9 (H) 07/21/2021    Star Rating Drugs: Glipizide 2.5 mg - Last filled 11/08/21 30 DS at [place]  Pharmacist Assistant [phone]

## 2021-12-08 ENCOUNTER — Telehealth: Payer: Self-pay | Admitting: Pharmacist

## 2021-12-08 ENCOUNTER — Other Ambulatory Visit: Payer: Self-pay | Admitting: *Deleted

## 2021-12-08 ENCOUNTER — Telehealth: Payer: Medicare HMO

## 2021-12-08 MED ORDER — RIVAROXABAN 10 MG PO TABS: 10.0000 mg | ORAL_TABLET | Freq: Every day | ORAL | 12 refills | Status: DC

## 2021-12-08 NOTE — Telephone Encounter (Signed)
Patient called back. Enrolled patient in [place] for [place] discount program. Will request rx for Xarelto to be sent to [place].

## 2021-12-08 NOTE — Progress Notes (Signed)
Call to Oncology (Dr Libby Maw) Office , requested prescription for Xarelto 30 DS with refills be sent to [place] in patients chart as she has been approved for assistance with Herb Grays. Did not reach a person had to leave a voicemail on the nurse's line. Left my contact information in the event there are any further questions.  Patient Assistance: Lennice Sites- Approved 2023  Ned Clines [facility] Clinical Pharmacist Assistant [phone]

## 2021-12-08 NOTE — Telephone Encounter (Signed)
Called patient to go through application for [employer] for reduced payment for Xarelto and unable to reach. Left a voicemail and requested a call back.

## 2021-12-14 ENCOUNTER — Ambulatory Visit: Payer: Medicare HMO | Admitting: Family Medicine

## 2021-12-21 ENCOUNTER — Ambulatory Visit (INDEPENDENT_AMBULATORY_CARE_PROVIDER_SITE_OTHER): Payer: Medicare HMO | Admitting: Family Medicine

## 2021-12-21 VITALS — BP 144/78 | HR 68 | Temp 97.9°F | Ht 68.5 in | Wt 216.0 lb

## 2021-12-21 DIAGNOSIS — I5020 Unspecified systolic (congestive) heart failure: Secondary | ICD-10-CM

## 2021-12-21 DIAGNOSIS — I10 Essential (primary) hypertension: Secondary | ICD-10-CM

## 2021-12-21 DIAGNOSIS — E1140 Type 2 diabetes mellitus with diabetic neuropathy, unspecified: Secondary | ICD-10-CM | POA: Diagnosis not present

## 2021-12-21 DIAGNOSIS — N184 Chronic kidney disease, stage 4 (severe): Secondary | ICD-10-CM

## 2021-12-21 DIAGNOSIS — Z23 Encounter for immunization: Secondary | ICD-10-CM

## 2021-12-21 DIAGNOSIS — Z0000 Encounter for general adult medical examination without abnormal findings: Secondary | ICD-10-CM | POA: Diagnosis not present

## 2021-12-21 DIAGNOSIS — E890 Postprocedural hypothyroidism: Secondary | ICD-10-CM

## 2021-12-21 LAB — CBC WITH DIFFERENTIAL/PLATELET
Basophils Absolute: 0 10*3/uL (ref 0.0–0.1)
Basophils Relative: 0.4 % (ref 0.0–3.0)
Eosinophils Absolute: 0.1 10*3/uL (ref 0.0–0.7)
Eosinophils Relative: 1.1 % (ref 0.0–5.0)
HCT: 37.5 % (ref 36.0–46.0)
Hemoglobin: 12.1 g/dL (ref 12.0–15.0)
Lymphocytes Relative: 27.1 % (ref 12.0–46.0)
Lymphs Abs: 2 10*3/uL (ref 0.7–4.0)
MCHC: 32.1 g/dL (ref 30.0–36.0)
MCV: 95.2 fl (ref 78.0–100.0)
Monocytes Absolute: 0.5 10*3/uL (ref 0.1–1.0)
Monocytes Relative: 6.9 % (ref 3.0–12.0)
Neutro Abs: 4.7 10*3/uL (ref 1.4–7.7)
Neutrophils Relative %: 64.5 % (ref 43.0–77.0)
Platelets: 281 10*3/uL (ref 150.0–400.0)
RBC: 3.94 Mil/uL (ref 3.87–5.11)
RDW: 14.8 % (ref 11.5–15.5)
WBC: 7.3 10*3/uL (ref 4.0–10.5)

## 2021-12-21 LAB — BASIC METABOLIC PANEL
BUN: 30 mg/dL — ABNORMAL HIGH (ref 6–23)
CO2: 25 mEq/L (ref 19–32)
Calcium: 9.9 mg/dL (ref 8.4–10.5)
Chloride: 104 mEq/L (ref 96–112)
Creatinine, Ser: 1.75 mg/dL — ABNORMAL HIGH (ref 0.40–1.20)
GFR: 27.59 mL/min — ABNORMAL LOW (ref >60.00)
Glucose, Bld: 127 mg/dL — ABNORMAL HIGH (ref 70–99)
Potassium: 4 mEq/L (ref 3.5–5.1)
Sodium: 140 mEq/L (ref 135–145)

## 2021-12-21 LAB — VITAMIN D 25 HYDROXY (VIT D DEFICIENCY, FRACTURES): VITD: 7.97 ng/mL — ABNORMAL LOW (ref 30.00–100.00)

## 2021-12-21 LAB — LIPID PANEL
Cholesterol: 236 mg/dL — ABNORMAL HIGH (ref 0–200)
HDL: 49.4 mg/dL (ref >39.00)
LDL Cholesterol: 161 mg/dL — ABNORMAL HIGH (ref 0–99)
NonHDL: 186.83
Total CHOL/HDL Ratio: 5
Triglycerides: 128 mg/dL (ref 0.0–149.0)
VLDL: 25.6 mg/dL (ref 0.0–40.0)

## 2021-12-21 LAB — TSH: TSH: 2.71 u[IU]/mL (ref 0.35–5.50)

## 2021-12-21 LAB — MICROALBUMIN / CREATININE URINE RATIO
Creatinine,U: 218 mg/dL
Microalb Creat Ratio: 77.4 mg/g — ABNORMAL HIGH (ref 0.0–30.0)
Microalb, Ur: 168.7 mg/dL — ABNORMAL HIGH (ref 0.0–1.9)

## 2021-12-21 LAB — HEMOGLOBIN A1C: Hgb A1c MFr Bld: 7.5 % — ABNORMAL HIGH (ref 4.6–6.5)

## 2021-12-21 NOTE — Progress Notes (Signed)
Subjective:     Paula Stein is a 76 y.o. female with pmh sig for HFrEF, CAD, HTN, NICM with ICD in place, PAD, portal vein thrombosis, complex sleep apnea, GERD, multiple colonic polyps, h/o sigmoid diverticulitis, DM 2, HLD, hypothyroidism, CKD stage IIIb, history of tobacco use who is here for a comprehensive physical exam. The patient reports insomnia.  May wake up around 2 AM and stay up for 45 minutes to an hour.  Typically will get 6-7 hours of sleep per night.  Not using CPAP much as needs new eyes.  Recently seen by Dr. Lorenso Courier at the cancer center.  Discussed need for anticoagulation.  Xarelto decreased in pt chooses to stay on medication for another 6 months.  Patient notes decreased urination, drinking 48 ounces of water per day.  Has appointment with nephrology on 12/28/2021.  Patient to go on a cruise in the next few months with her family.  Social History   Socioeconomic History   Marital status: Divorced    Spouse name: Not on file   Number of children: 1   Years of education: Not on file   Highest education level: Some college, no degree  Occupational History   Occupation: Retired    Fish farm manager: RETIRED    Comment: Education officer, museum  Tobacco Use   Smoking status: Light Smoker    Packs/day: 0.25    Years: 40.00    Total pack years: 10.00    Types: Cigarettes   Smokeless tobacco: Never   Tobacco comments:    on and off,currently a pack / week ( 10/05/20)   Does 1 cigarette daily (10/05/2021)  Vaping Use   Vaping Use: Never used  Substance and Sexual Activity   Alcohol use: Yes    Alcohol/week: 0.0 standard drinks of alcohol    Comment: rare glass of wine   Drug use: No   Sexual activity: Not Currently    Birth control/protection: None  Other Topics Concern   Not on file  Social History Narrative   Retired Education officer, museum   Divorced - one grown son   current smoker    Alcohol use-no      Drug use-no    Regular exercise- no     son - Paula Stein   Social Determinants  of Health   Financial Resource Strain: Low Risk  (10/21/2021)   Overall Financial Resource Strain (CARDIA)    Difficulty of Paying Living Expenses: Not very hard  Food Insecurity: No Food Insecurity (10/21/2021)   Hunger Vital Sign    Worried About Running Out of Food in the Last Year: Never true    Ran Out of Food in the Last Year: Never true  Transportation Needs: No Transportation Needs (10/21/2021)   PRAPARE - Hydrologist (Medical): No    Lack of Transportation (Non-Medical): No  Physical Activity: Insufficiently Active (10/21/2021)   Exercise Vital Sign    Days of Exercise per Week: 1 day    Minutes of Exercise per Session: 10 min  Stress: No Stress Concern Present (10/21/2021)   [place]    Feeling of Stress : Only a little  Social Connections: Moderately Integrated (10/21/2021)   Social Connection and Isolation Panel [NHANES]    Frequency of Communication with Friends and Family: More than three times a week    Frequency of Social Gatherings with Friends and Family: More than three times a week    Attends  Religious Services: More than 4 times per year    Active Member of Clubs or Organizations: Yes    Attends Club or Organization Meetings: 1 to 4 times per year    Marital Status: Divorced  Intimate Partner Violence: Not At Risk (10/21/2021)   Humiliation, Afraid, Rape, and Kick questionnaire    Fear of Current or Ex-Partner: No    Emotionally Abused: No    Physically Abused: No    Sexually Abused: No   Health Maintenance  Topic Date Due   COVID-19 Vaccine (6 - Moderna risk series) 03/01/2021   FOOT EXAM  05/31/2021   OPHTHALMOLOGY EXAM  06/12/2021   INFLUENZA VACCINE  01/31/2022 (Originally 11/01/2021)   HEMOGLOBIN A1C  01/20/2022   Diabetic kidney evaluation - Urine ACR  09/28/2022   Diabetic kidney evaluation - GFR measurement  11/04/2022   TETANUS/TDAP  01/19/2031   Pneumonia  Vaccine 75+ Years old  Completed   DEXA SCAN  Completed   Hepatitis C Screening  Completed   Zoster Vaccines- Shingrix  Completed   HPV VACCINES  Aged Out   COLONOSCOPY (Pts 45-72yr Insurance coverage will need to be confirmed)  Discontinued   Diabetic Foot Exam - Simple   No data filed    The following portions of the patient's history were reviewed and updated as appropriate: allergies, current medications, past family history, past medical history, past social history, past surgical history, and problem list.  Review of Systems Pertinent items noted in HPI and remainder of comprehensive ROS otherwise negative.   Objective:    BP (!) 144/78 (BP Location: Right Arm, Patient Position: Sitting, Cuff Size: Large)   Pulse 68   Temp 97.9 F (36.6 C) (Oral)   Ht 5' 8.5" (1.74 m)   Wt 216 lb (98 kg)   SpO2 97%   BMI 32.36 kg/m  General appearance: alert, cooperative, and no distress Head: Normocephalic, without obvious abnormality, atraumatic Eyes: conjunctivae/corneas clear. PERRL, EOM's intact. Fundi benign. Ears: normal TM's and external ear canals both ears Nose: Nares normal. Septum midline. Mucosa normal. No drainage or sinus tenderness. Throat: lips, mucosa, and tongue normal; teeth and gums normal Neck: no adenopathy, no carotid bruit, no JVD, supple, symmetrical, trachea midline, and thyroid not enlarged, symmetric, no tenderness/mass/nodules Lungs: clear to auscultation bilaterally Heart: regular rate and rhythm, S1, S2 normal, no murmur, click, rub or gallop Abdomen: soft, non-tender; bowel sounds normal; no masses,  no organomegaly Extremities: extremities normal, atraumatic, no cyanosis or edema Pulses: 2+ and symmetric Skin: Skin color, texture, turgor normal. No rashes or lesions Lymph nodes: Cervical, supraclavicular, and axillary nodes normal. Neurologic: Alert and oriented X 3, normal strength and tone. Normal symmetric reflexes. Normal coordination and gait     Diabetic Foot Exam - Simple   Simple Foot Form Diabetic Foot exam was performed with the following findings: Yes 12/21/2021 10:49 AM  Visual Inspection See comments: Yes Sensation Testing See comments: Yes Pulse Check See comments: Yes Comments Corns on several toes, calluses of bilateral heels, decreased 1+ DP and PT pulses bilaterally.  Intact to monofilament testing.  Decreased sensation vibratory sense bilateral right greater than left.     Assessment:    Healthy female exam.      Plan:    Anticipatory guidance given including wearing seatbelts, smoke detectors in the home, increasing physical activity, increasing p.o. intake of water and vegetables. -labs -Immunizations reviewed -Mammogram done 03/10/2021 -Colonoscopy done 11/10/2021 -Given handout -Next CPE in 1 year See After Visit  Summary for Counseling Recommendations   Flu vaccine need  - Plan: Flu Vaccine QUAD High Dose(Fluad)  Essential hypertension -Elevated -Recheck BP -Continue lifestyle modifications -Continue current medications including Coreg 25 mg twice daily, Lasix 40 mg twice daily, spironolactone 50 mg daily - Plan: Basic metabolic panel, TSH, Lipid panel  Type 2 diabetes mellitus with diabetic neuropathy, without long-term current use of insulin (HCC) -Hemoglobin A1c 6.9% on 07/21/2021 -Continue current medications including glipizide XL 2.5 mg - Plan: Microalbumin/Creatinine Ratio, Urine, Hemoglobin A1c, Lipid panel  Stage 4 chronic kidney disease (HCC)  -Creatinine 1.69 on 11/03/2021 -Patient encouraged to keep follow-up with nephrology -Renally dose medications -Avoid nephrotoxic medications - Plan: CBC with Differential/Platelet, Basic metabolic panel, Vitamin D, 25-hydroxy  Postablative hypothyroidism -Continue Synthroid 125 mcg daily  - Plan: TSH  HFrEF -Euvolemic on exam -Echo on 07/07/2021 with EF 24-81%, grade 1 diastolic dysfunction, LA mildly dilated.  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  Follow-up as needed in 4 months, sooner if needed  Grier Mitts, MD

## 2021-12-22 ENCOUNTER — Encounter: Payer: Self-pay | Admitting: Family Medicine

## 2021-12-23 ENCOUNTER — Other Ambulatory Visit: Payer: Self-pay | Admitting: Family Medicine

## 2021-12-23 DIAGNOSIS — E559 Vitamin D deficiency, unspecified: Secondary | ICD-10-CM

## 2021-12-23 MED ORDER — VITAMIN D (ERGOCALCIFEROL) 1.25 MG (50000 UNIT) PO CAPS: 50000.0000 [IU] | ORAL_CAPSULE | ORAL | 0 refills | Status: DC

## 2021-12-26 ENCOUNTER — Encounter: Payer: Self-pay | Admitting: Family Medicine

## 2021-12-27 ENCOUNTER — Encounter: Payer: Self-pay | Admitting: Family Medicine

## 2021-12-27 NOTE — Telephone Encounter (Signed)
Lab results from last office visit faxed to [facility], confirmation rec'd.

## 2021-12-29 ENCOUNTER — Telehealth: Payer: Self-pay | Admitting: Pharmacist

## 2021-12-29 NOTE — Chronic Care Management (AMB) (Signed)
  Chronic Care Management Pharmacy Assistant   Name: Paula Stein  MRN: [id number] DOB: 04/17/1945  Reason for Encounter: Medication Review Medication Coordination    Recent office visits:  12/21/21 Banks, Shannon R, MD - Patient presented for Well adult exam and other concerns. No medication changes.  Recent consult visits:  None   Hospital visits:  Medication Reconciliation was completed by comparing discharge summary, patient's EMR and Pharmacy list, and upon discussion with patient.   Patient presented to [facility] [place] on 11/24/21 due to Lower Lip Swelling. Patient was present for 55 min.   New?Medications Started at Hospital Discharge:?? -started  Aquaphor   Medication Changes at Hospital Discharge: -Changed  none   Medications Discontinued at Hospital Discharge: -Stopped  none   Medications that remain the same after Hospital Discharge:??  -All other medications will remain the same.       Patient presented to [facility] on 11/10/21 Colonoscopy with Propofol. Patient was present for 3 hours.   New?Medications Started at Hospital Discharge:?? -started  none   Medication Changes at Hospital Discharge: -Changed  none   Medications Discontinued at Hospital Discharge: -Stopped  none   Medications that remain the same after Hospital Discharge:??  -All other medications will remain the same.     Patient presented to [facility] on 07/06/21 due to Acute on chronic systolic Congestive Heart Failure. Patient was present for 4 days.   New?Medications Started at Hospital Discharge:?? -started  polyethylene glycol (MIRALAX / GLYCOLAX) sacubitril-valsartan (ENTRESTO   Medication Changes at Hospital Discharge: -Changed  None   Medications Discontinued at Hospital Discharge: -Stopped  Entresto 49-51 MG (sacubitril-valsartan)   Medications that remain the same after Hospital Discharge:??  -All other medications will  remain the same.    Medications: Outpatient Encounter Medications as of 12/29/2021  Medication Sig   ACCU-CHEK GUIDE test strip USE TO CHECK BLOOD SUGAR DAILY   Accu-Chek Softclix Lancets lancets USE TO CHECK BLOOD SUGAR DAILY   allopurinol (ZYLOPRIM) 100 MG tablet Take 1 tablet (100 mg total) by mouth daily.   Blood Glucose Monitoring Suppl (ACCU-CHEK GUIDE) w/Device KIT 1 each by Does not apply route daily. Use to check blood sugar daily   Calcium Carb-Cholecalciferol (CALCIUM 600+D3 PO) Take 1 tablet by mouth daily.   carvedilol (COREG) 25 MG tablet Take 1 tablet (25 mg total) by mouth 2 (two) times daily with a meal.   furosemide (LASIX) 40 MG tablet Take 1 tablet (40 mg total) by mouth 2 (two) times daily.   glipiZIDE (GLUCOTROL XL) 2.5 MG 24 hr tablet Take 1 tablet (2.5 mg total) by mouth daily with breakfast.   isosorbide mononitrate (IMDUR) 30 MG 24 hr tablet Take 1 tablet (30 mg total) by mouth daily.   levothyroxine (SYNTHROID) 125 MCG tablet TAKE 1 TABLET EVERY MORNING ON AN EMPTY STOMACH 30 MINUTES BEFORE TAKING OTHER MEDICATIONS OR BEFORE BREAKFAST   mineral oil-hydrophilic petrolatum (AQUAPHOR) ointment Apply topically as needed for dry skin. (Patient not taking: Reported on 12/21/2021)   rivaroxaban (XARELTO) 10 MG TABS tablet Take 1 tablet (10 mg total) by mouth daily with supper.   sacubitril-valsartan (ENTRESTO) 97-103 MG Take 1 tablet by mouth 2 (two) times daily.   spironolactone (ALDACTONE) 50 MG tablet Take 1 tablet (50 mg total) by mouth daily.   traZODone (DESYREL) 50 MG tablet Take 0.5-1 tablets (25-50 mg total) by mouth at bedtime as needed for sleep.   Vitamin D, Ergocalciferol, (  DRISDOL) 1.25 MG (50000 UNIT) CAPS capsule Take 1 capsule (50,000 Units total) by mouth every 7 (seven) days.   No facility-administered encounter medications on file as of 12/29/2021.  Reviewed chart for medication changes ahead of medication coordination call.  BP Readings from Last 3  Encounters:  12/21/21 (!) 144/78  11/24/21 132/63  11/23/21 (!) 158/90    Lab Results  Component Value Date   HGBA1C 7.5 (H) 12/21/2021     Patient obtains medications through Adherence Packaging  30 Days   Last adherence delivery included:  Isosorbide Mono (IMDUR) 30 mg: Take one tab daily at [facility] Furosemide (Lasix) 40 mg: Take one tab at Breakfast and one at Dinner Allopurinol ( Zyloprim) 100 mg : Take one tab at Breakfast Carvedilol (Coreg) 25 mg: Take one tab at Breakfast and one at [facility] Entresto 97-103 mg: Take one tab at Breakfast and one at Dinner Xarelto 10 mg : Take one tab at [facility]inner (pt informed of decrease) Levothyroxine (Synthroid) 125 mcg: Take one tab Before Breakfast Spironolactone 50 mg: Take one tab at Dinner Glipizide 2.5 mg: Take one tab at Breakfast   Confirmed delivery date of 12/09/21, advised patient that pharmacy will contact them the morning of delivery.    Patient is due for next adherence delivery on: 01/10/22. Called patient and reviewed medications and coordinated delivery. Packs 30 DS   This delivery to include: Isosorbide Mono (IMDUR) 30 mg: Take one tab daily at Dinner Furosemide (Lasix) 40 mg: Take one tab at Breakfast and one at [facility] Allopurinol ( Zyloprim) 100 mg : Take one tab at Breakfast Carvedilol (Coreg) 25 mg: Take one tab at Breakfast and one at [facility] Entresto 97-103 mg: Take one tab at Breakfast and one at Dinner Xarelto 10 mg : Take one tab at [facility] (pt informed of decrease) Levothyroxine (Synthroid) 125 mcg: Take one tab Before Breakfast Spironolactone 50 mg: Take one tab at Dinner Glipizide 2.5 mg: Take one tab at Breakfast  Vitamin D 1230 mcg: One cap every 7 days     Patient declined the following medications  All with the exception of her Vitamin D. She reports she just started on her medication packs from last months delivery on last Friday as she had to hold them with several recent procedures. 30 DS from last fri  would end on 10/22 patient in agreement to push back delivery date for this month until then. Will still send Vit D on 10 /10    Confirmed delivery date of 01/10/22, advised patient that pharmacy will contact them the morning of delivery.   Care Gaps: Covid Booster - Overdue Eye Exam - Overdue CCM- 12/23 BP- 144/78 12/21/21 AWV- 9/23 Lab Results  Component Value Date   HGBA1C 7.5 (H) 12/21/2021    Star Rating Drugs: Glipizide 2.5 mg - Last filled 12/05/21 30 DS at [facility]    Laresia Green CMA Clinical Pharmacist Assistant [phone]  

## 2021-12-30 NOTE — Telephone Encounter (Signed)
Noted  

## 2022-01-02 ENCOUNTER — Encounter: Payer: Self-pay | Admitting: Family Medicine

## 2022-01-05 DIAGNOSIS — N1832 Chronic kidney disease, stage 3b: Secondary | ICD-10-CM | POA: Diagnosis not present

## 2022-01-05 DIAGNOSIS — I129 Hypertensive chronic kidney disease with stage 1 through stage 4 chronic kidney disease, or unspecified chronic kidney disease: Secondary | ICD-10-CM | POA: Diagnosis not present

## 2022-01-05 DIAGNOSIS — E1122 Type 2 diabetes mellitus with diabetic chronic kidney disease: Secondary | ICD-10-CM | POA: Diagnosis not present

## 2022-01-05 DIAGNOSIS — R809 Proteinuria, unspecified: Secondary | ICD-10-CM | POA: Diagnosis not present

## 2022-01-09 ENCOUNTER — Telehealth: Payer: Self-pay | Admitting: Pharmacist

## 2022-01-09 NOTE — Chronic Care Management (AMB) (Cosign Needed Addendum)
Chronic Care Management Pharmacy Assistant   Name: Paula Stein  MRN: [id number] DOB: Feb 17, 1946  Reason for Encounter: Medication Review / Medication Coordination Call   Recent office visits:  None  Recent consult visits:  None  Hospital visits:  None  Medications: Outpatient Encounter Medications as of 01/09/2022  Medication Sig   ACCU-CHEK GUIDE test strip USE TO CHECK BLOOD SUGAR DAILY   Accu-Chek Softclix Lancets lancets USE TO CHECK BLOOD SUGAR DAILY   allopurinol (ZYLOPRIM) 100 MG tablet Take 1 tablet (100 mg total) by mouth daily.   Blood Glucose Monitoring Suppl (ACCU-CHEK GUIDE) w/Device KIT 1 each by Does not apply route daily. Use to check blood sugar daily   Calcium Carb-Cholecalciferol (CALCIUM 600+D3 PO) Take 1 tablet by mouth daily.   carvedilol (COREG) 25 MG tablet Take 1 tablet (25 mg total) by mouth 2 (two) times daily with a meal.   furosemide (LASIX) 40 MG tablet Take 1 tablet (40 mg total) by mouth 2 (two) times daily.   glipiZIDE (GLUCOTROL XL) 2.5 MG 24 hr tablet Take 1 tablet (2.5 mg total) by mouth daily with breakfast.   isosorbide mononitrate (IMDUR) 30 MG 24 hr tablet Take 1 tablet (30 mg total) by mouth daily.   levothyroxine (SYNTHROID) 125 MCG tablet TAKE 1 TABLET EVERY MORNING ON AN EMPTY STOMACH 30 MINUTES BEFORE TAKING OTHER MEDICATIONS OR BEFORE BREAKFAST   mineral oil-hydrophilic petrolatum (AQUAPHOR) ointment Apply topically as needed for dry skin. (Patient not taking: Reported on 12/21/2021)   rivaroxaban (XARELTO) 10 MG TABS tablet Take 1 tablet (10 mg total) by mouth daily with supper.   sacubitril-valsartan (ENTRESTO) 97-103 MG Take 1 tablet by mouth 2 (two) times daily.   spironolactone (ALDACTONE) 50 MG tablet Take 1 tablet (50 mg total) by mouth daily.   traZODone (DESYREL) 50 MG tablet Take 0.5-1 tablets (25-50 mg total) by mouth at bedtime as needed for sleep.   Vitamin D, Ergocalciferol, (DRISDOL) 1.25 MG (50000 UNIT) CAPS capsule  Take 1 capsule (50,000 Units total) by mouth every 7 (seven) days.   No facility-administered encounter medications on file as of 01/09/2022.   Reviewed chart for medication changes ahead of medication coordination call.  BP Readings from Last 3 Encounters:  12/21/21 (!) 144/78  11/24/21 132/63  11/23/21 (!) 158/90    Lab Results  Component Value Date   HGBA1C 7.5 (H) 12/21/2021     Patient obtains medications through Adherence Packaging  30 Days    Last adherence delivery included:  Isosorbide Mono (IMDUR) 30 mg: Take one tab daily at Dinner Furosemide (Lasix) 40 mg: Take one tab at Breakfast and one at Dinner Allopurinol ( Zyloprim) 100 mg : Take one tab at Breakfast Carvedilol (Coreg) 25 mg: Take one tab at Breakfast and one at [facility] 97-103 mg: Take one tab at Breakfast and one at [facility] 10 mg : Take one tab at [facility] (pt informed of decrease) Levothyroxine (Synthroid) 125 mcg: Take one tab Before Breakfast Spironolactone 50 mg: Take one tab at Dinner Glipizide 2.5 mg: Take one tab at Breakfast Vitamin D 1230 mcg: One cap every 7 days       Patient is due for next adherence delivery on: 01/19/2022  This delivery to include: Furosemide (Lasix) 40 mg: Take one tab at Breakfast and one at Dinner Carvedilol (Coreg) 25 mg: Take one tab at Breakfast and one at [facility] Isosorbide Mono (IMDUR) 30 mg: Take one tab daily at Dinner Allopurinol ( Zyloprim)  100 mg : Take one tab at Breakfast Entresto 97-103 mg: Take one tab at Breakfast and one at Dinner Levothyroxine (Synthroid) 125 mcg: Take one tab Before Breakfast Spironolactone 50 mg: Take one tab at Dinner Glipizide 2.5 mg: Take one tab at Breakfast Vitamin D 1250 mcg: One cap every 7 days    Patient declined the following medications:  Xarelto - patient received 2 months free.   Confirmed delivery date of 01/19/2022, advised patient that pharmacy will contact them the morning of delivery.    Care  Gaps: AWV - completed 10/21/2021 Last BP - 144/78 on 12/21/2021 Covid - overdue Eye exam - overdue  Star Rating Drugs: Glipizide 2.5 mg - last filled 12/05/2021 30 DS at [facility] [phone]

## 2022-01-17 DIAGNOSIS — I129 Hypertensive chronic kidney disease with stage 1 through stage 4 chronic kidney disease, or unspecified chronic kidney disease: Secondary | ICD-10-CM | POA: Diagnosis not present

## 2022-01-27 ENCOUNTER — Ambulatory Visit (INDEPENDENT_AMBULATORY_CARE_PROVIDER_SITE_OTHER): Payer: Medicare HMO

## 2022-01-27 DIAGNOSIS — I428 Other cardiomyopathies: Secondary | ICD-10-CM | POA: Diagnosis not present

## 2022-01-27 DIAGNOSIS — Z95810 Presence of automatic (implantable) cardiac defibrillator: Secondary | ICD-10-CM | POA: Diagnosis not present

## 2022-01-30 LAB — CUP PACEART REMOTE DEVICE CHECK
Battery Remaining Percentage: 77 %
Date Time Interrogation Session: 20231027082500
Implantable Lead Connection Status: 753985
Implantable Lead Implant Date: 20160707
Implantable Lead Location: 753862
Implantable Lead Model: 3401
Implantable Pulse Generator Implant Date: 20210813
Pulse Gen Serial Number: 143555

## 2022-01-31 ENCOUNTER — Encounter: Payer: Self-pay | Admitting: Gastroenterology

## 2022-02-02 NOTE — Progress Notes (Signed)
Remote ICD transmission.   

## 2022-02-05 ENCOUNTER — Other Ambulatory Visit: Payer: Self-pay | Admitting: Family Medicine

## 2022-02-05 DIAGNOSIS — M1A0790 Idiopathic chronic gout, unspecified ankle and foot, without tophus (tophi): Secondary | ICD-10-CM

## 2022-02-07 ENCOUNTER — Telehealth: Payer: Self-pay

## 2022-02-07 ENCOUNTER — Telehealth: Payer: Self-pay | Admitting: Pharmacist

## 2022-02-07 NOTE — Chronic Care Management (AMB) (Signed)
Chronic Care Management Pharmacy Assistant   Name: Paula Stein  MRN: [id number] DOB: March 29, 1946  Reason for Encounter: Medication Review Medication Coordination    Recent office visits:  None  Recent consult visits:  None  Hospital visits:  None in previous 6 months  Medications: Outpatient Encounter Medications as of 02/07/2022  Medication Sig   ACCU-CHEK GUIDE test strip USE TO CHECK BLOOD SUGAR DAILY   Accu-Chek Softclix Lancets lancets USE TO CHECK BLOOD SUGAR DAILY   allopurinol (ZYLOPRIM) 100 MG tablet TAKE ONE TABLET BY MOUTH ONCE DAILY   Blood Glucose Monitoring Suppl (ACCU-CHEK GUIDE) w/Device KIT 1 each by Does not apply route daily. Use to check blood sugar daily   Calcium Carb-Cholecalciferol (CALCIUM 600+D3 PO) Take 1 tablet by mouth daily.   carvedilol (COREG) 25 MG tablet Take 1 tablet (25 mg total) by mouth 2 (two) times daily with a meal.   furosemide (LASIX) 40 MG tablet Take 1 tablet (40 mg total) by mouth 2 (two) times daily.   glipiZIDE (GLUCOTROL XL) 2.5 MG 24 hr tablet Take 1 tablet (2.5 mg total) by mouth daily with breakfast.   isosorbide mononitrate (IMDUR) 30 MG 24 hr tablet Take 1 tablet (30 mg total) by mouth daily.   levothyroxine (SYNTHROID) 125 MCG tablet TAKE 1 TABLET EVERY MORNING ON AN EMPTY STOMACH 30 MINUTES BEFORE TAKING OTHER MEDICATIONS OR BEFORE BREAKFAST   mineral oil-hydrophilic petrolatum (AQUAPHOR) ointment Apply topically as needed for dry skin. (Patient not taking: Reported on 12/21/2021)   rivaroxaban (XARELTO) 10 MG TABS tablet Take 1 tablet (10 mg total) by mouth daily with supper.   sacubitril-valsartan (ENTRESTO) 97-103 MG Take 1 tablet by mouth 2 (two) times daily.   spironolactone (ALDACTONE) 50 MG tablet Take 1 tablet (50 mg total) by mouth daily.   traZODone (DESYREL) 50 MG tablet Take 0.5-1 tablets (25-50 mg total) by mouth at bedtime as needed for sleep.   Vitamin D, Ergocalciferol, (DRISDOL) 1.25 MG (50000 UNIT) CAPS  capsule Take 1 capsule (50,000 Units total) by mouth every 7 (seven) days.   No facility-administered encounter medications on file as of 02/07/2022.   Reviewed chart for medication changes ahead of medication coordination call.  BP Readings from Last 3 Encounters:  12/21/21 (!) 144/78  11/24/21 132/63  11/23/21 (!) 158/90    Lab Results  Component Value Date   HGBA1C 7.5 (H) 12/21/2021     Patient obtains medications through Adherence Packaging  30 Days   Last adherence delivery included: Furosemide (Lasix) 40 mg: Take one tab at Breakfast and one at Dinner Carvedilol (Coreg) 25 mg: Take one tab at Breakfast and one at [employer] Isosorbide Mono (IMDUR) 30 mg: Take one tab daily at Dinner Allopurinol ( Zyloprim) 100 mg : Take one tab at Breakfast Entresto 97-103 mg: Take one tab at Breakfast and one at Dinner Levothyroxine (Synthroid) 125 mcg: Take one tab Before Breakfast Spironolactone 50 mg: Take one tab at Dinner Glipizide 2.5 mg: Take one tab at Breakfast Vitamin D 1250 mcg: One cap every 7 days    Patient declined the following medications:  Xarelto - patient received 2 months free.    Confirmed delivery date of 01/19/2022, advised patient that pharmacy will contact them the morning of delivery.      Patient is due for next adherence delivery on: 02/20/22. Called patient and reviewed medications and coordinated delivery. Packs 30 DS  This delivery to include: Furosemide (Lasix) 40 mg: Take one tab at Breakfast  and one at Dinner  Carvedilol (Coreg) 25 mg: Take one tab at Breakfast and one at [employer] Isosorbide Mono (IMDUR) 30 mg: Take one tab daily at Dinner Allopurinol ( Zyloprim) 100 mg : Take one tab at Breakfast Entresto 97-103 mg: Take one tab at Breakfast and one at Dinner Levothyroxine (Synthroid) 125 mcg: Take one tab Before Breakfast Spironolactone 50 mg: Take one tab at Dinner Glipizide 2.5 mg: Take one tab at Breakfast Vitamin D 1250 mcg: One cap every 7  days       Patient declined all medications, she reports she just started her October delivery of medications on 02/06/22  including Xarelto - patient  reports she received 2 months free reports she still has 1 month left. Forwarded to pharmacist for assistance. Patient denies missing any doses of her medications daily.   Confirmed delivery date of 02/20/22, advised patient that pharmacy will contact them the morning of delivery.   Care Gaps: COVID Booster - Overdue Eye Exam - Overdue CCM- 12/23 BP- 144/78 12/21/21 AWV- 7/23 Lab Results  Component Value Date   HGBA1C 7.5 (H) 12/21/2021    Star Rating Drugs: Glipizide 2.5 mg - last filled 01/16/2022 30 DS at [facility] Pharmacist Assistant [phone]

## 2022-02-07 NOTE — Telephone Encounter (Signed)
Application signed by Dr. Gasper Sells and faxed to [facility]. Confirmation received that fax was successful.

## 2022-02-07 NOTE — Telephone Encounter (Signed)
**Note De-Identified Stony Stegmann Obfuscation** We received a completed [employer] Pt Asst application for [employer] without documents Adham Johnson fax from Dr Volanda Napoleon office at [employer] I have completed the providers page of the application and have e-mailed all to [place], Clinical Supervisor so she can obtain our DOD, Dr Oralia Rud signature in Dr York Cerise absence, date it, and to fax all to [facility] at the fax number written on the cover letter included.

## 2022-02-15 ENCOUNTER — Other Ambulatory Visit: Payer: Self-pay | Admitting: Family Medicine

## 2022-02-15 DIAGNOSIS — Z1231 Encounter for screening mammogram for malignant neoplasm of breast: Secondary | ICD-10-CM

## 2022-02-22 NOTE — Chronic Care Management (AMB) (Signed)
Reached out to patient to discuss medication delivery and see where she is with current supply of medications, patient reports she "just started her packs dated 11/6 on yesterday" she further reports she would like her delivery pushed out again to the last week in December when she will be returning from her cruise to [place] and [place]. Patient reports she has not missed any doses of her medications. Patient was not sent any medications for this month of November and is to follow up with Pharmacist on next month, Pharmacist aware and advised.    [place] Clinical Pharmacist Assistant [phone]

## 2022-03-03 ENCOUNTER — Telehealth: Payer: Self-pay | Admitting: Pharmacist

## 2022-03-03 NOTE — Chronic Care Management (AMB) (Signed)
    Chronic Care Management Pharmacy Assistant   Name: Paula Stein  MRN: [id number] DOB: July 02, 1945  03/03/22 APPOINTMENT REMINDER   Called Patient No answer, left message of appointment on 03/06/22 at 2:15 via telephone visit with [name], Pharm D.   Notified to have all medications, supplements, blood pressure and/or blood sugar logs available during appointment and to return call if need to reschedule.  Care Gaps: Eye Exam - Overdue COVID Booster - Overdue BP- 144/78 12/21/21 Lab Results  Component Value Date   HGBA1C 7.5 (H) 12/21/2021    Star Rating Drug: Glipizide 2.5 mg - last filled 01/16/2022 30 DS at [facility]         Medications: Outpatient Encounter Medications as of 03/03/2022  Medication Sig   ACCU-CHEK GUIDE test strip USE TO CHECK BLOOD SUGAR DAILY   Accu-Chek Softclix Lancets lancets USE TO CHECK BLOOD SUGAR DAILY   allopurinol (ZYLOPRIM) 100 MG tablet TAKE ONE TABLET BY MOUTH ONCE DAILY   Blood Glucose Monitoring Suppl (ACCU-CHEK GUIDE) w/Device KIT 1 each by Does not apply route daily. Use to check blood sugar daily   Calcium Carb-Cholecalciferol (CALCIUM 600+D3 PO) Take 1 tablet by mouth daily.   carvedilol (COREG) 25 MG tablet Take 1 tablet (25 mg total) by mouth 2 (two) times daily with a meal.   furosemide (LASIX) 40 MG tablet Take 1 tablet (40 mg total) by mouth 2 (two) times daily.   glipiZIDE (GLUCOTROL XL) 2.5 MG 24 hr tablet Take 1 tablet (2.5 mg total) by mouth daily with breakfast.   isosorbide mononitrate (IMDUR) 30 MG 24 hr tablet Take 1 tablet (30 mg total) by mouth daily.   levothyroxine (SYNTHROID) 125 MCG tablet TAKE 1 TABLET EVERY MORNING ON AN EMPTY STOMACH 30 MINUTES BEFORE TAKING OTHER MEDICATIONS OR BEFORE BREAKFAST   mineral oil-hydrophilic petrolatum (AQUAPHOR) ointment Apply topically as needed for dry skin. (Patient not taking: Reported on 12/21/2021)   rivaroxaban (XARELTO) 10 MG TABS tablet Take 1 tablet (10 mg total) by  mouth daily with supper.   sacubitril-valsartan (ENTRESTO) 97-103 MG Take 1 tablet by mouth 2 (two) times daily.   spironolactone (ALDACTONE) 50 MG tablet Take 1 tablet (50 mg total) by mouth daily.   traZODone (DESYREL) 50 MG tablet Take 0.5-1 tablets (25-50 mg total) by mouth at bedtime as needed for sleep.   Vitamin D, Ergocalciferol, (DRISDOL) 1.25 MG (50000 UNIT) CAPS capsule Take 1 capsule (50,000 Units total) by mouth every 7 (seven) days.   No facility-administered encounter medications on file as of 03/03/2022.      [place] Clinical Pharmacist Assistant [phone]

## 2022-03-06 ENCOUNTER — Ambulatory Visit: Payer: Medicare HMO | Admitting: Pharmacist

## 2022-03-06 DIAGNOSIS — I10 Essential (primary) hypertension: Secondary | ICD-10-CM

## 2022-03-06 DIAGNOSIS — E1140 Type 2 diabetes mellitus with diabetic neuropathy, unspecified: Secondary | ICD-10-CM

## 2022-03-06 NOTE — Progress Notes (Signed)
Chronic Care Management Pharmacy Note  03/06/2022 Name:  Paula Stein MRN:  [id number] DOB:  March 31, 1946  Summary: Pt is non compliant with medications BP not at goal < 130/80 LDL not at goal < 55 A1c not at goal < 7%  Recommendations/Changes made from today's visit: -Recommended bringing BP cuff to PCP visit in January to ensure accuracy -Recommended high intensity statin therapy -Recommended alternating times of day for checking blood sugars  Plan: BP and DM assessment in 2 months Follow up in 4 months  Subjective: Paula Stein is an 76 y.o. year old female who is a primary patient of [name], MD.  The CCM team was consulted for assistance with disease management and care coordination needs.    Engaged with patient by telephone for follow up visit in response to provider referral for pharmacy case management and/or care coordination services.   Consent to Services:  The patient was given information about Chronic Care Management services, agreed to services, and gave verbal consent prior to initiation of services.  Please see initial visit note for detailed documentation.   Patient Care Team: [name], MD as PCP - General (Family Medicine) [name], MD as PCP - Cardiology (Cardiology) [name], MD as PCP - Electrophysiology (Cardiology) [name], DO as Consulting Physician (Gastroenterology) Viona Gilmore, [facility] as Pharmacist (Pharmacist) Orson Slick, MD as Consulting Physician (Hematology and Oncology) Leighton Ruff, [place] (Optometry)  Recent office visits: 12/21/21 [name], MD - Patient presented for Well adult exam and other concerns. No medication changes.   10/21/21 [name], LPN: Patient presented for AWV. Patient voiced goal of stopping smoking.  09/09/21 [name], MD - Patient presented for Itching. Prescribed Hydroxyzine Pamoate 25 mg. Stopped Empagliflozin 10 mg.  Recent consult visits: 01/05/22  Pearson Grippe (nephrology): Patient presented for CKD follow up. Unable to access notes.  11/10/21 Patient presented for colonoscopy.  11/03/21 Merleen Milliner, MD (Oncology) - Patient presented for Portal vein thrombosis and other concerns. Decreased Xarelto to 10 mg daily for maintenance dosing. Removed Farxiga from medication list. Follow up in 6 months.  10/24/21 Diona Browner, NP (cardiology): Patient presented for pre-op CV exam.  10/05/21 Gerrit Heck, DO (gastroenterology): Patient presented to discuss colonoscopy.  08/12/21 [name], MD (Cardiology) - Patient presented for CHF and other concerns. Prescribed Empagliflozin 10 mg daily.   Hospital visits: Medication Reconciliation was completed by comparing discharge summary, patient's EMR and Pharmacy list, and upon discussion with patient.   Patient presented to [facility] on 11/24/21 due to Lower Lip Swelling. Patient was present for 55 min.   New?Medications Started at [facility] Discharge:?? -started  Aquaphor   Medication Changes at Hospital Discharge: -Changed  none   Medications Discontinued at Hospital Discharge: -Stopped  none   Medications that remain the same after Hospital Discharge:??  -All other medications will remain the same.       Patient presented to [facility] on 11/10/21 Colonoscopy with Propofol. Patient was present for 3 hours.   New?Medications Started at [facility] Discharge:?? -started  none   Medication Changes at Hospital Discharge: -Changed  none   Medications Discontinued at Hospital Discharge: -Stopped  none   Medications that remain the same after Hospital Discharge:??  -All other medications will remain the same.    Objective:  Lab Results  Component Value Date   CREATININE 1.75 (H) 12/21/2021   BUN 30 (H)  12/21/2021   GFR 27.59 (L) 12/21/2021   EGFR 39 (L) 08/19/2021   GFRNONAA 31 (L) 11/03/2021   GFRAA 40 (L) 03/31/2020   NA 140  12/21/2021   K 4.0 12/21/2021   CALCIUM 9.9 12/21/2021   CO2 25 12/21/2021   GLUCOSE 127 (H) 12/21/2021    Lab Results  Component Value Date/Time   HGBA1C 7.5 (H) 12/21/2021 11:03 AM   HGBA1C 6.9 (H) 07/21/2021 11:15 AM   GFR 27.59 (L) 12/21/2021 11:03 AM   GFR 29.07 (L) 07/21/2021 11:15 AM   MICROALBUR 168.7 (H) 12/21/2021 11:03 AM   MICROALBUR 33.5 (H) 06/02/2015 10:07 AM    Last diabetic Eye exam:  Lab Results  Component Value Date/Time   HMDIABEYEEXA No Retinopathy 06/09/2020 12:00 AM    Last diabetic Foot exam:  Lab Results  Component Value Date/Time   HMDIABFOOTEX yes 04/20/2010 12:00 AM     Lab Results  Component Value Date   CHOL 236 (H) 12/21/2021   HDL 49.40 12/21/2021   LDLCALC 161 (H) 12/21/2021   LDLDIRECT 195.2 02/15/2011   TRIG 128.0 12/21/2021   CHOLHDL 5 12/21/2021       Latest Ref Rng & Units 11/03/2021    9:38 AM 09/27/2021   12:00 AM 07/07/2021    3:57 AM  Hepatic Function  Total Protein 6.5 - 8.1 g/dL 8.2   6.8   Albumin 3.5 - 5.0 g/dL 4.1  3.6     2.8   AST 15 - 41 U/L 10   14   ALT 0 - 44 U/L 7   17   Alk Phosphatase 38 - 126 U/L 93   71   Total Bilirubin 0.3 - 1.2 mg/dL 0.5   0.3      This result is from an external source.    Lab Results  Component Value Date/Time   TSH 2.71 12/21/2021 11:03 AM   TSH 5.23 (H) 03/31/2020 09:19 AM   FREET4 0.86 03/13/2018 06:35 PM   FREET4 1.16 (H) 05/22/2017 02:25 PM       Latest Ref Rng & Units 12/21/2021   11:03 AM 11/03/2021    9:38 AM 09/27/2021   12:00 AM  CBC  WBC 4.0 - 10.5 K/uL 7.3  7.8    Hemoglobin 12.0 - 15.0 g/dL 12.1  12.2  12.4      Hematocrit 36.0 - 46.0 % 37.5  38.0    Platelets 150.0 - 400.0 K/uL 281.0  251       This result is from an external source.    Lab Results  Component Value Date/Time   VD25OH 7.97 (L) 12/21/2021 11:03 AM    Clinical ASCVD: Yes  The 10-year ASCVD risk score (Arnett DK, et al., 2019) is: 69.2%   Values used to calculate the score:     Age: 85  years     Sex: Female     Is Non-Hispanic African American: Yes     Diabetic: Yes     Tobacco smoker: Yes     Systolic Blood Pressure: 149 mmHg     Is BP treated: Yes     HDL Cholesterol: 49.4 mg/dL     Total Cholesterol: 236 mg/dL       12/21/2021   10:15 AM 10/21/2021   10:26 AM 09/09/2021    8:53 AM  Depression screen PHQ 2/9  Decreased Interest 0 0 0  Down, Depressed, Hopeless 0 0 0  PHQ - 2 Score 0 0 0  Altered sleeping  1 0 3  Tired, decreased energy 1 0 0  Change in appetite 0 0 0  Feeling bad or failure about yourself  0 0 0  Trouble concentrating 0 0 0  Moving slowly or fidgety/restless 0 0 0  Suicidal thoughts 0 0 0  PHQ-9 Score 2 0 3  Difficult doing work/chores Not difficult at all Not difficult at all Very difficult      Social History   Tobacco Use  Smoking Status Light Smoker   Packs/day: 0.25   Years: 40.00   Total pack years: 10.00   Types: Cigarettes  Smokeless Tobacco Never  Tobacco Comments   on and off,currently a pack / week ( 10/05/20)   Does 1 cigarette daily (10/05/2021)   BP Readings from Last 3 Encounters:  12/21/21 (!) 144/78  11/24/21 132/63  11/23/21 (!) 158/90   Pulse Readings from Last 3 Encounters:  12/21/21 68  11/24/21 76  11/23/21 70   Wt Readings from Last 3 Encounters:  12/21/21 216 lb (98 kg)  11/23/21 218 lb 11 oz (99.2 kg)  11/10/21 222 lb (100.7 kg)   BMI Readings from Last 3 Encounters:  12/21/21 32.36 kg/m  11/23/21 32.29 kg/m  11/10/21 32.78 kg/m    Assessment/Interventions: Review of patient past medical history, allergies, medications, health status, including review of consultants reports, laboratory and other test data, was performed as part of comprehensive evaluation and provision of chronic care management services.   SDOH:  (Social Determinants of Health) assessments and interventions performed: Yes  SDOH Interventions    Flowsheet Row Chronic Care Management from 03/06/2022 in [place] at  [place] from 12/21/2021 in [place] at [place] from 10/21/2021 in [place] at [place] Management from 07/15/2021 in [place] at [place] from 10/20/2020 in [place] at [place] from 10/21/2019 in [place] at [place] Interventions -- -- Intervention Not Indicated -- -- --  Housing Interventions -- -- Intervention Not Indicated -- Intervention Not Indicated Intervention Not Indicated  Transportation Interventions -- -- Intervention Not Indicated Intervention Not Indicated Intervention Not Indicated Intervention Not Indicated  Depression Interventions/Treatment  -- PHQ2-9 Score <4 Follow-up Not Indicated -- -- -- PHQ2-9 Score <4 Follow-up Not Indicated  Financial Strain Interventions Intervention Not Indicated -- Intervention Not Indicated Intervention Not Indicated -- Intervention Not Indicated  Physical Activity Interventions -- -- Intervention Not Indicated -- Intervention Not Indicated Other (Comments)  [Patient states that she walks to her mailbox daily]  Stress Interventions -- -- Intervention Not Indicated -- -- Intervention Not Indicated  Social Connections Interventions -- -- Intervention Not Indicated -- Intervention Not Indicated Intervention Not Indicated       SDOH Screenings   Food Insecurity: No Food Insecurity (10/21/2021)  Housing: Low Risk  (10/21/2021)  Transportation Needs: No Transportation Needs (10/21/2021)  Alcohol Screen: Low Risk  (10/21/2021)  Depression (PHQ2-9): Low Risk  (12/21/2021)  Financial Resource Strain: Low Risk  (03/06/2022)  Physical Activity: Insufficiently Active (10/21/2021)  Social Connections: Moderately Integrated (10/21/2021)  Stress: No Stress Concern Present (10/21/2021)  Tobacco Use: High Risk (12/26/2021)      CCM Care Plan  Allergies  Allergen Reactions   Farxiga  [Dapagliflozin] Rash   Ibuprofen Nausea Only   Jardiance [Empagliflozin] Itching    Medications Reviewed Today     Reviewed by [name], MD (Physician) on 12/26/21 at 1736  Med  List Status: <None>   Medication Order Taking? Sig Documenting Provider Last Dose Status Informant  ACCU-CHEK GUIDE test strip [id number] Yes USE TO CHECK BLOOD SUGAR DAILY [name], MD Taking Active Self  Accu-Chek Softclix Lancets lancets [id number] Yes USE TO CHECK BLOOD SUGAR DAILY [name], MD Taking Active Self  allopurinol (ZYLOPRIM) 100 MG tablet [id number] Yes Take 1 tablet (100 mg total) by mouth daily. [name], MD Taking Active Self  Blood Glucose Monitoring Suppl (ACCU-CHEK GUIDE) w/Device KIT [id number] Yes 1 each by Does not apply route daily. Use to check blood sugar daily [name], MD Taking Active Self  Calcium Carb-Cholecalciferol (CALCIUM 600+D3 PO) [id number] Yes Take 1 tablet by mouth daily. [provider] Taking Active Self  carvedilol (COREG) 25 MG tablet [id number] Yes Take 1 tablet (25 mg total) by mouth 2 (two) times daily with a meal. [name], MD Taking Active Self  furosemide (LASIX) 40 MG tablet [id number] Yes Take 1 tablet (40 mg total) by mouth 2 (two) times daily. [name], MD Taking Active Self  glipiZIDE (GLUCOTROL XL) 2.5 MG 24 hr tablet [id number] Yes Take 1 tablet (2.5 mg total) by mouth daily with breakfast. [name], MD Taking Active Self  isosorbide mononitrate (IMDUR) 30 MG 24 hr tablet [id number] Yes Take 1 tablet (30 mg total) by mouth daily. [name], MD Taking Active Self  levothyroxine (SYNTHROID) 125 MCG tablet [id number] Yes TAKE 1 TABLET EVERY MORNING ON AN EMPTY STOMACH 30 MINUTES BEFORE TAKING OTHER MEDICATIONS OR BEFORE BREAKFAST [name], MD Taking Active Self  mineral oil-hydrophilic petrolatum (AQUAPHOR) ointment [id number] No Apply topically as needed for dry skin.  Patient not taking:  Reported on 12/21/2021   Talbot Grumbling, FNP Not Taking Active   rivaroxaban (XARELTO) 10 MG TABS tablet [id number] Yes Take 1 tablet (10 mg total) by mouth daily with supper. Orson Slick, MD Taking Active   sacubitril-valsartan [facility]) 97-103 MG [id number] Yes Take 1 tablet by mouth 2 (two) times daily. [name], MD Taking Active Self  spironolactone (ALDACTONE) 50 MG tablet [id number] Yes Take 1 tablet (50 mg total) by mouth daily. [name], MD Taking Active Self  traZODone (DESYREL) 50 MG tablet [id number] Yes Take 0.5-1 tablets (25-50 mg total) by mouth at bedtime as needed for sleep. [name], MD Taking Active Self  Vitamin D, Ergocalciferol, (DRISDOL) 1.25 MG (50000 UNIT) CAPS capsule [id number]  Take 1 capsule (50,000 Units total) by mouth every 7 (seven) days. [name], MD  Active             Patient Active Problem List   Diagnosis Date Noted   History of colonic polyps    Cecal polyp    Stage 3b chronic kidney disease (CKD) ([place]) 07/07/2021   DM2 (diabetes mellitus, type 2) ([place]) 07/07/2021   Acute on chronic systolic (congestive) heart failure ([place]) 07/06/2021   Diverticulosis of colon without hemorrhage    Multiple polyps of sigmoid colon    Polyp of ascending colon    Polyp of descending colon    Rectal polyp    Lower abdominal pain    Coagulopathy ([place]) 04/13/2020   Sigmoid diverticulitis 03/15/2020   Portal vein thrombosis 03/14/2020   HFrEF (heart failure with reduced ejection fraction) ([place]) 03/13/2018   Non-ischemic cardiomyopathy ([place])    Hypertension    Diabetes mellitus without complication ([place])    CAD (coronary artery disease)  06/18/2017   PAD (peripheral artery disease) ([place]) 06/18/2017   ICD (implantable cardioverter-defibrillator) in place 06/18/2017   Chronic combined systolic and diastolic CHF (congestive heart failure) ([place]) 05/22/2017   Hyperlipidemia associated with type 2 diabetes mellitus ([place]) 12/11/2016    Treatment-emergent central sleep apnea 09/20/2016   NICM (nonischemic cardiomyopathy) ([place]) 10/08/2014   AICD (automatic cardioverter/defibrillator) present 10/08/2014   Cigarette smoker 10/30/2012   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   DeQuervain's disease (tenosynovitis) 09/01/2010   Atherosclerosis 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 ([place]) 12/07/2008   Diabetes ([place]) 10/29/2008   Hypothyroidism 01/31/2007   Essential hypertension 01/31/2007   GERD 01/31/2007    Immunization History  Administered Date(s) Administered   Fluad Quad(high Dose 65+) 12/24/2018, 12/15/2019, 01/14/2021, 12/21/2021   Influenza Split 02/15/2011   Influenza Whole 08/22/2004, 02/18/2007, 12/31/2008, 02/17/2010, 01/02/2012   Influenza, High Dose Seasonal PF 01/04/2015, 12/11/2016, 01/03/2018   Influenza,inj,Quad PF,6+ Mos 06/26/2013, 02/03/2014   Influenza-Unspecified 02/02/2016   Moderna Sars-Covid-2 Vaccination 04/18/2019, 05/16/2019, 02/03/2020, 09/14/2020, 01/04/2021   Pneumococcal Conjugate-13 06/26/2013   Pneumococcal Polysaccharide-23 08/22/2004, 02/03/2014   Td 12/22/2002   Tdap 06/26/2013, 01/18/2021   Zoster Recombinat (Shingrix) 10/22/2019, 01/02/2020   Zoster, Live 07/24/2014   Patient reports her BP has been good at home but she brought in her cuff at a recent visit and it "wasn't as good as it should've been" . She has changed batteries since then and will bring it back to her PCP visit in January.  Patient reports her blood sugars are "doing great". She is currently working on getting her A1c down. Patient has cut back starches/carbs. Patient was drinking soft drinks before and now she drinks diet ginger ale and sprite. She thought that darker sodas had more sugar so she cut these out completely.  Patient is drinking lots of water every day, which is about 8 glasses.  Patient had plenty on hand of her medications. She has some prepackaged medications on hand and is  still working through these. She reports she "could be missing doses once in a while".  Conditions to be addressed/monitored:  Hypertension, Hyperlipidemia, Diabetes, Heart Failure, Hypothyroidism, and Gout  Conditions addressed this visit: Hypertension, heart failure, hyperlipidemia  Care Plan : CCM Pharmacist Care Plan  Updates made by Viona Gilmore, [place] since 03/06/2022 12:00 AM     Problem: Problem: Hypertension, Hyperlipidemia, Diabetes, Heart Failure, Hypothyroidism, and Gout      Long-Range Goal: Patient-Specific Goal   Start Date: 07/15/2021  Expected End Date: 07/16/2022  Recent Progress: On track  Priority: High  Note:   Current Barriers:  Unable to independently monitor therapeutic efficacy Unable to achieve control of cholesterol  Unable to self administer medications as prescribed  Pharmacist Clinical Goal(s):  Patient will achieve adherence to monitoring guidelines and medication adherence to achieve therapeutic efficacy achieve control of cholesterol as evidenced by next lipid panel  through collaboration with PharmD and provider.   Interventions: 1:1 collaboration with [name], MD regarding development and update of comprehensive plan of care as evidenced by provider attestation and co-signature Inter-disciplinary care team collaboration (see longitudinal plan of care) Comprehensive medication review performed; medication list updated in electronic medical record  Hypertension (BP goal <130/80) -Not ideally controlled -Current treatment: Entresto 97-103 mg 1 tablet twice daily - Appropriate, Query effective, Safe, Accessible Spironolactone 50 mg 1 tablet daily - Appropriate, Query effective, Safe, Accessible Carvedilol 25 mg 1 tablet twice daily - Appropriate, Query effective, Safe, Accessible -Medications previously tried:  n/a  -Current home readings: 120/65 (checking every day - usually checks twice if it's higher after the morning) - arm cuff  (she did bring it into the office) -Current dietary habits: uses Mrs. Dash -Current exercise habits: was doing chair exercises at the [facility] -Denies hypotensive/hypertensive symptoms -Educated on BP goals and benefits of medications for prevention of heart attack, stroke and kidney damage; Daily salt intake goal < 2300 mg; Importance of home blood pressure monitoring; Proper BP monitoring technique; -Counseled to monitor BP at home at least weekly, document, and provide log at future appointments -Counseled on diet and exercise extensively Recommended to continue current medication  Hyperlipidemia: (LDL goal < 55) -Uncontrolled -Current treatment: No medication -Medications previously tried: atorvastatin (cramping)  -Current dietary patterns: eating at home mostly -Current exercise habits: not consistently -Educated on Cholesterol goals;  Benefits of statin for ASCVD risk reduction; Importance of limiting foods high in cholesterol; -Counseled on diet and exercise extensively Recommended high intensity statin therapy.  CAD/PAD (Goal: prevent events) -Uncontrolled -Current treatment  Xarelto 10 mg 1 tablet daily - Appropriate, Effective, Safe, Query accessible Isosorbide mononitrate 30 mg 1 tablet daily  - Appropriate, Effective, Safe, Accessible -Medications previously tried: n/a  -Recommended taking isosorbide daily as prescribed.  Diabetes (A1c goal <7%) -Uncontrolled -Current medications: Glipizide 2.5 mg 1 tablet daily - Appropriate, Query effective, Query Safe, Accessible -Medications previously tried: none  -Current home glucose readings fasting glucose: 120s (highest 135) post prandial glucose: n/a -Denies hypoglycemic/hyperglycemic symptoms -Current meal patterns:  breakfast:  eating around 9:30 - shredded wheat or cheerios and piece of toast and bacon lunch: usually skips or a snack - sandwich or salad dinner: 6:30-7:30 - pasta, chicken, shrimp, meatloaf; chicken  breast with spinach snacks: n/a drinks: tea, water, zero sugar coke (one bottle every 3 days), lots of cranberry juice -Current exercise: not doing anything right now -Educated on A1c and blood sugar goals; Complications of diabetes including kidney damage, retinal damage, and cardiovascular disease; Benefits of routine self-monitoring of blood sugar; Carbohydrate counting and/or plate method -Counseled to check feet daily and get yearly eye exams -Counseled on diet and exercise extensively Recommended to continue current medication Mail healthy plate handout.  Heart Failure (Goal: manage symptoms and prevent exacerbations) -Uncontrolled -Last ejection fraction: 07/07/21 (Date: 30-35%) -HF type: Combined Systolic and Diastolic -NYHA Class: II (slight limitation of activity) -AHA HF Stage: C (Heart disease and symptoms present) -Current treatment: Entresto 97-103 mg 1 tablet twice daily - Appropriate, Effective, Safe, Query accessible Spironolactone 50 mg 1 tablet daily - Appropriate, Effective, Safe, Accessible Furosemide 40 mg 1 tablet twice daily  - Appropriate, Query effective, Safe, Accessible Isosorbide mononitrate 30 mg 1 tablet daily - Appropriate, Effective, Safe, Accessible Carvedilol 25 mg 1 tablet twice daily - Appropriate, Effective, Safe, Accessible -Medications previously tried: [place] and Jardiance (rash) -Current home BP/HR readings: goes up and down - 180/72 down to 120/72 -Current dietary habits: eating out often; frozen vegetables more than canned -Current exercise habits: not consistently  -Educated on Benefits of medications for managing symptoms and prolonging life Importance of weighing daily; if you gain more than 3 pounds in one day or 5 pounds in one week, call cardiologist. Proper diuretic administration and potassium supplementation Importance of blood pressure control -Counseled on diet and exercise extensively Recommended to continue current  medication Recommended discussing efficacy of furosemide with cardiology.  Portal vein thrombosis (Goal: prevent clots) -Controlled -Current treatment  Xarelto 20 mg 1 tablet daily with supper - Appropriate,  Effective, Safe, Query accessible -Medications previously tried: none  -Recommended to continue current medication Assessed patient finances. Apply for PAP for Xarelto.  Gout (Goal: uric acid < 6 and prevent flare ups) -Uncontrolled -Current treatment  Allopurinol 100 mg 1 tablet daily as needed - Appropriate, Query effective, Safe, Accessible -Medications previously tried: none  -Recommended to continue current medication Recommended taking every day for gout prevention.  Osteopenia (Goal prevent fractures) -Uncontrolled -Last DEXA Scan: 01/01/20   T-Score femoral neck: -1.4  T-Score total hip: n/a  T-Score lumbar spine: -1.1  T-Score forearm radius: n/a  10-year probability of major osteoporotic fracture: 5.0%  10-year probability of hip fracture: 1.5% -Patient is not a candidate for pharmacologic treatment -Current treatment  Vitamin D 50,000 units 1 capsule once weekly - Appropriate, Query effective, Safe, Accessible -Medications previously tried: none  -Recommend [phone] units of vitamin D daily. Recommend 1200 mg of calcium daily from dietary and supplemental sources. Recommend weight-bearing and muscle strengthening exercises for building and maintaining bone density. -Recommended supplementing with calcium and vitamin D.   Hypothyroidism (Goal: 2.5-4.5) -Controlled -Current treatment  Levothyroxine 125 mcg 1 tablet daily - Appropriate, Effective, Safe, Accessible -Medications previously tried: none  -Recommended repeat TSH.  Health Maintenance -Vaccine gaps: COVID booster -Current therapy:  Miralax as needed Acetaminophen 500 mg 1 tablet as needed Dicyclomine 10 mg 1 capsule as needed -Educated on Cost vs benefit of each product must be carefully weighed by  individual consumer -Patient is satisfied with current therapy and denies issues -Recommended to continue current medication  Patient Goals/Self-Care Activities Patient will:  - check glucose daily, document, and provide at future appointments check blood pressure a few times a week, document, and provide at future appointments weigh daily, and contact provider if weight gain of > 3 lbs in one day or > 5 lbs in one week. target a minimum of 150 minutes of moderate intensity exercise weekly  Follow Up Plan: Telephone follow up appointment with care management team member scheduled for: 4 months       Medication Assistance: Application for Xarelto and Entresto  medication assistance program. in process.  Anticipated assistance start date 08/26/21.  See plan of care for additional detail.  Compliance/Adherence/Medication fill history: Care Gaps: COVID booster, eye exam BP- 144/78 (12/21/21) A1c - 7.5% (12/21/21)  Star-Rating Drugs: Glipizide 2.5 mg - last filled 01/16/2022 30 DS at [facility]   Patient's preferred pharmacy is:  [facility] - [place], [place] - [address] [address] [place] [address] Phone: [phone] Fax: [phone]  [place] - [address] [place]) [place] [address] Phone: [phone] Fax: [phone]  [facility] [id number] - [place] [address] [place] [address] Phone: [phone] Fax: [phone]  Uses pill box? Yes - weekly pill box (morning, noon, nighttime) Pt endorses 80% compliance   We discussed: Benefits of medication synchronization, packaging and delivery as well as enhanced pharmacist oversight with [facility]. Patient decided to: Utilize [facility] pharmacy for medication synchronization, packaging and delivery  Care Plan and Follow Up Patient Decision:  Patient  agrees to Care Plan and Follow-up.  Plan: Telephone follow up appointment with care management team member scheduled for:  4 months  [name], PharmD, [place] [facility] [place] [phone]

## 2022-03-06 NOTE — Patient Instructions (Signed)
Hi Antonique,  It was great to catch up again! Please make sure to bring your blood pressure cuff to your appointment with Dr. Volanda Napoleon in January.  Please reach out to me if you have any questions or need anything before our follow up!  Best, Maddie  [name], PharmD, [place] Pharmacist [place] [facility] [place]

## 2022-03-13 ENCOUNTER — Encounter: Payer: Self-pay | Admitting: Internal Medicine

## 2022-03-31 ENCOUNTER — Other Ambulatory Visit: Payer: Self-pay | Admitting: Family Medicine

## 2022-03-31 DIAGNOSIS — E559 Vitamin D deficiency, unspecified: Secondary | ICD-10-CM

## 2022-03-31 NOTE — Telephone Encounter (Signed)
Explain to patient the prescription is one time sent with no refills. Patient can take OTC 1000-2000iu a day. Verbalized understanding.  Patient added that she doesn't like to use [facility] pharmacy bc they do automatically refills for her. She would like to request the refill when she need it and want to switch it back with [facility].

## 2022-04-05 ENCOUNTER — Ambulatory Visit
Admission: RE | Admit: 2022-04-05 | Discharge: 2022-04-05 | Disposition: A | Discharge status: Home / Self Care | Payer: Medicare HMO | Source: Ambulatory Visit

## 2022-04-05 ENCOUNTER — Telehealth: Payer: Self-pay | Admitting: Pharmacist

## 2022-04-05 DIAGNOSIS — Z1231 Encounter for screening mammogram for malignant neoplasm of breast: Secondary | ICD-10-CM

## 2022-04-05 NOTE — Progress Notes (Signed)
Care Coordination Pharmacy Assistant   Patient ID: Paula Stein, female   DOB: June 10, 1945, 77 y.o.   MRN: [id number]  Reason for Encounter: Medication Review Medication Coordination    Recent office visits:  None   Recent consult visits:  None  Hospital visits:  Medication Reconciliation was completed by comparing discharge summary, patient's EMR and Pharmacy list, and upon discussion with patient.   Patient presented to [facility] on 11/24/21 due to Lower Lip Swelling. Patient was present for 55 min.   New?Medications Started at [facility] Discharge:?? -started  Aquaphor   Medication Changes at Hospital Discharge: -Changed  none   Medications Discontinued at Hospital Discharge: -Stopped  none   Medications that remain the same after Hospital Discharge:??  -All other medications will remain the same.       Patient presented to [facility] on 11/10/21 Colonoscopy with Propofol. Patient was present for 3 hours.   New?Medications Started at [facility] Discharge:?? -started  none   Medication Changes at Hospital Discharge: -Changed  none   Medications Discontinued at Hospital Discharge: -Stopped  none   Medications that remain the same after Hospital Discharge:??  -All other medications will remain the same.      Medications: Outpatient Encounter Medications as of 04/05/2022  Medication Sig   ACCU-CHEK GUIDE test strip USE TO CHECK BLOOD SUGAR DAILY   Accu-Chek Softclix Lancets lancets USE TO CHECK BLOOD SUGAR DAILY   allopurinol (ZYLOPRIM) 100 MG tablet TAKE ONE TABLET BY MOUTH ONCE DAILY   Blood Glucose Monitoring Suppl (ACCU-CHEK GUIDE) w/Device KIT 1 each by Does not apply route daily. Use to check blood sugar daily   Calcium Carb-Cholecalciferol (CALCIUM 600+D3 PO) Take 1 tablet by mouth daily.   carvedilol (COREG) 25 MG tablet Take 1 tablet (25 mg total) by mouth 2 (two) times daily with a meal.   furosemide (LASIX) 40 MG  tablet Take 1 tablet (40 mg total) by mouth 2 (two) times daily.   glipiZIDE (GLUCOTROL XL) 2.5 MG 24 hr tablet Take 1 tablet (2.5 mg total) by mouth daily with breakfast.   isosorbide mononitrate (IMDUR) 30 MG 24 hr tablet Take 1 tablet (30 mg total) by mouth daily.   levothyroxine (SYNTHROID) 125 MCG tablet TAKE 1 TABLET EVERY MORNING ON AN EMPTY STOMACH 30 MINUTES BEFORE TAKING OTHER MEDICATIONS OR BEFORE BREAKFAST   mineral oil-hydrophilic petrolatum (AQUAPHOR) ointment Apply topically as needed for dry skin. (Patient not taking: Reported on 12/21/2021)   rivaroxaban (XARELTO) 10 MG TABS tablet Take 1 tablet (10 mg total) by mouth daily with supper.   sacubitril-valsartan (ENTRESTO) 97-103 MG Take 1 tablet by mouth 2 (two) times daily.   spironolactone (ALDACTONE) 50 MG tablet Take 1 tablet (50 mg total) by mouth daily.   traZODone (DESYREL) 50 MG tablet Take 0.5-1 tablets (25-50 mg total) by mouth at bedtime as needed for sleep.   Vitamin D, Ergocalciferol, (DRISDOL) 1.25 MG (50000 UNIT) CAPS capsule Take 1 capsule (50,000 Units total) by mouth every 7 (seven) days.   No facility-administered encounter medications on file as of 04/05/2022.   Reviewed chart for medication changes ahead of medication coordination call.   BP Readings from Last 3 Encounters:  12/21/21 (!) 144/78  11/24/21 132/63  11/23/21 (!) 158/90    Lab Results  Component Value Date   HGBA1C 7.5 (H) 12/21/2021     Patient obtains medications through Adherence Packaging  30 Days   Last adherence delivery included:  Furosemide (  Lasix) 40 mg: Take one tab at Breakfast and one at Dinner  Carvedilol (Coreg) 25 mg: Take one tab at Breakfast and one at [employer] Isosorbide Mono (IMDUR) 30 mg: Take one tab daily at Dinner Allopurinol ( Zyloprim) 100 mg : Take one tab at Breakfast Entresto 97-103 mg: Take one tab at Breakfast and one at Dinner Levothyroxine (Synthroid) 125 mcg: Take one tab Before Breakfast Spironolactone 50  mg: Take one tab at Dinner Glipizide 2.5 mg: Take one tab at Breakfast Vitamin D 1250 mcg: One cap every 7 days        Patient declined all medications, she reports she just started her October delivery of medications on 02/06/22  including Xarelto - patient  reports she received 2 months free reports she still has 1 month left. Forwarded to pharmacist for assistance. Patient denies missing any doses of her medications daily.   Patient is due for next adherence delivery on: 04/13/22. Called patient and reviewed medications and coordinated delivery. Packs 30 DS  This delivery to include: Furosemide (Lasix) 40 mg: Take one tab at Breakfast and one at Dinner  Carvedilol (Coreg) 25 mg: Take one tab at Breakfast and one at [employer] Isosorbide Mono (IMDUR) 30 mg: Take one tab daily at Dinner Allopurinol ( Zyloprim) 100 mg : Take one tab at Breakfast Entresto 97-103 mg: Take one tab at Breakfast and one at Dinner Levothyroxine (Synthroid) 125 mcg: Take one tab Before Breakfast Spironolactone 50 mg: Take one tab at Dinner Glipizide 2.5 mg: Take one tab at Breakfast Vitamin D 1250 mcg: One cap every 7 days   Spoke to patient she reports "She has changed insurances and is able to get her medications at a more discounted rate at another pharmacy and wishes to discontinue her delivery service with [facility]", she reports she will call me in the interim if she is needing anything sent before everything is switched over. MP/ Pharmacy advised  Care Gaps: Eye Exam- Overdue COVID Booster - Overdue AWV- 10/21/21 BP- 144/78 12/21/21 Lab Results  Component Value Date   HGBA1C 7.5 (H) 12/21/2021    Star Rating Drugs: Glipizide 2.5 mg - last filled 01/16/2022 30 DS at [facility]      Pamala Duffel CMA Clinical Pharmacist Assistant [phone]

## 2022-04-06 ENCOUNTER — Telehealth: Payer: Self-pay | Admitting: Family Medicine

## 2022-04-06 DIAGNOSIS — E039 Hypothyroidism, unspecified: Secondary | ICD-10-CM

## 2022-04-06 DIAGNOSIS — I10 Essential (primary) hypertension: Secondary | ICD-10-CM

## 2022-04-06 DIAGNOSIS — M1A0790 Idiopathic chronic gout, unspecified ankle and foot, without tophus (tophi): Secondary | ICD-10-CM

## 2022-04-06 DIAGNOSIS — G4700 Insomnia, unspecified: Secondary | ICD-10-CM

## 2022-04-06 NOTE — Telephone Encounter (Signed)
Pt called to request a refill of the following:  allopurinol (ZYLOPRIM) 100 MG tablet   traZODone (DESYREL) 50 MG tablet   glipiZIDE (GLUCOTROL XL) 2.5 MG 24 hr tablet   levothyroxine (SYNTHROID) 125 MCG tablet   isosorbide mononitrate (IMDUR) 30 MG 24 hr tablet   furosemide (LASIX) 40 MG tablet   carvedilol (COREG) 25 MG tablet   Pt states she does not want to use [facility] anymore.  LOV:  12/21/21  = CPE   [id number] - [place], [place] AT [place] Phone: [phone]  Fax: [phone]

## 2022-04-07 MED ORDER — FUROSEMIDE 40 MG PO TABS: 40.0000 mg | ORAL_TABLET | Freq: Two times a day (BID) | ORAL | 3 refills | Status: DC

## 2022-04-07 MED ORDER — CARVEDILOL 25 MG PO TABS: 25.0000 mg | ORAL_TABLET | Freq: Two times a day (BID) | ORAL | 3 refills | Status: DC

## 2022-04-07 MED ORDER — GLIPIZIDE ER 2.5 MG PO TB24: 2.5000 mg | ORAL_TABLET | Freq: Every day | ORAL | 1 refills | Status: DC

## 2022-04-07 MED ORDER — ISOSORBIDE MONONITRATE ER 30 MG PO TB24: 30.0000 mg | ORAL_TABLET | Freq: Every day | ORAL | 3 refills | Status: DC

## 2022-04-07 MED ORDER — LEVOTHYROXINE SODIUM 125 MCG PO TABS: ORAL_TABLET | ORAL | 1 refills | Status: DC

## 2022-04-07 MED ORDER — TRAZODONE HCL 50 MG PO TABS: 25.0000 mg | ORAL_TABLET | Freq: Every evening | ORAL | 1 refills | Status: DC | PRN

## 2022-04-07 MED ORDER — ALLOPURINOL 100 MG PO TABS: 100.0000 mg | ORAL_TABLET | Freq: Every day | ORAL | 1 refills | Status: DC

## 2022-04-07 NOTE — Telephone Encounter (Signed)
Rx's sent to [facility] as requested.

## 2022-04-17 ENCOUNTER — Other Ambulatory Visit: Payer: Self-pay

## 2022-04-17 MED ORDER — SACUBITRIL-VALSARTAN 97-103 MG PO TABS: 1.0000 | ORAL_TABLET | Freq: Two times a day (BID) | ORAL | 3 refills | Status: DC

## 2022-04-21 ENCOUNTER — Ambulatory Visit (INDEPENDENT_AMBULATORY_CARE_PROVIDER_SITE_OTHER): Payer: Medicare HMO | Admitting: Family Medicine

## 2022-04-21 VITALS — BP 173/87 | HR 70 | Temp 97.8°F | Ht 68.5 in | Wt 229.4 lb

## 2022-04-21 DIAGNOSIS — I739 Peripheral vascular disease, unspecified: Secondary | ICD-10-CM | POA: Diagnosis not present

## 2022-04-21 DIAGNOSIS — I10 Essential (primary) hypertension: Secondary | ICD-10-CM

## 2022-04-21 DIAGNOSIS — E1140 Type 2 diabetes mellitus with diabetic neuropathy, unspecified: Secondary | ICD-10-CM

## 2022-04-21 DIAGNOSIS — E782 Mixed hyperlipidemia: Secondary | ICD-10-CM

## 2022-04-21 DIAGNOSIS — I5022 Chronic systolic (congestive) heart failure: Secondary | ICD-10-CM

## 2022-04-21 LAB — POCT GLYCOSYLATED HEMOGLOBIN (HGB A1C): Hemoglobin A1C: 6.9 % — AB (ref 4.0–5.6)

## 2022-04-21 MED ORDER — SPIRONOLACTONE 50 MG PO TABS: 50.0000 mg | ORAL_TABLET | Freq: Every day | ORAL | 3 refills | Status: DC

## 2022-04-21 NOTE — Progress Notes (Signed)
Established Patient Office Visit   Subjective  Patient ID: Paula Stein, female    DOB: July 02, 1945  Age: 77 y.o. MRN: [id number]  Chief Complaint  Patient presents with   Medical Management of Chronic Issues    Follow up on DM and BP.    Patient is a 77 year old female who presents for follow-up on chronic conditions.  Patient states she is doing well overall.  Notes elevation in BP at home 173/87 this morning.  Patient denies LE edema, CP, SOB, changes in vision.  Patient plans to start exercising more.  Requesting refill on spironolactone.  Taking Lasix, Entresto, Coreg, Imdur.  Had follow-up with nephrology for CKD 3.  Patient requesting prescription for diabetic shoes.  Cutting down on smoking, may have a few per day.       ROS Negative unless stated above    Objective:     BP (!) 173/87 (BP Location: Right Arm, Cuff Size: Normal) Comment (BP Location): BP monitor from home  Pulse 70   Temp 97.8 F (36.6 C) (Oral)   Ht 5' 8.5" (1.74 m)   Wt 229 lb 6.4 oz (104.1 kg)   SpO2 98%   BMI 34.37 kg/m    Physical Exam Constitutional:      General: She is not in acute distress.    Appearance: Normal appearance.  HENT:     Head: Normocephalic and atraumatic.     Nose: Nose normal.     Mouth/Throat:     Mouth: Mucous membranes are moist.  Eyes:     Extraocular Movements: Extraocular movements intact.     Conjunctiva/sclera: Conjunctivae normal.     Pupils: Pupils are equal, round, and reactive to light.  Cardiovascular:     Rate and Rhythm: Normal rate and regular rhythm.     Heart sounds: Normal heart sounds. No murmur heard.    No gallop.  Pulmonary:     Effort: Pulmonary effort is normal. No respiratory distress.     Breath sounds: Normal breath sounds. No wheezing, rhonchi or rales.  Musculoskeletal:        General: Normal range of motion.  Skin:    General: Skin is warm and dry.  Neurological:     Mental Status: She is alert and oriented to person, place,  and time.      Results for orders placed or performed in visit on 04/21/22  POC HgB A1c  Result Value Ref Range   Hemoglobin A1C 6.9 (A) 4.0 - 5.6 %   HbA1c POC (<> result, manual entry)     HbA1c, POC (prediabetic range)     HbA1c, POC (controlled diabetic range)        Assessment & Plan:  Essential hypertension -BP uncontrolled.  Has yet to take meds this morning. -Discussed importance of lifestyle modifications and medication compliance -Recheck BP -Patient advised to consider obtaining a new BP cuff for home use. -Continue sacubitril-valsartan (Entresto) 97-103 mg twice daily, Lasix 40 mg twice daily, Coreg 25 mg, twice daily Imdur 30 mg daily -     Spironolactone; Take 1 tablet (50 mg total) by mouth daily.  Dispense: 90 tablet; Refill: 3  Type 2 diabetes mellitus with diabetic neuropathy, without long-term current use of insulin (HCC) -Hemoglobin A1c 7.5% on 12/21/2021. -Hemoglobin A1c this visit 6.9% -Continue glipizide XL 2.5 mg -Intolerance to Jardiance-itching, Farxiga-rash -Last foot exam done 12/31/2021. -Eye exam done 06/12/2020.  Patient encouraged to schedule diabetic retinopathy screening -     POCT  glycosylated hemoglobin (Hb A1C) -     PR DIABETIC CUSTOM MOLDED SHOE  Mixed hyperlipidemia -Total cholesterol 236, HDL 49.4, LDL 161, triglycerides 128 on 12/21/2021. -In the past atorvastatin 40 mg caused cramping. -Continue lifestyle modifications  Chronic systolic heart failure (HCC) -HFrEF -EF 30-45% on echo 07/2021. -Euvolemic -Allergy to Jardiance and Farxiga. -Continue Lasix 40 mg twice daily, Coreg 25 mg twice daily, Imdur 30 mg daily, Entresto 97-103 mg twice daily. -Continue follow-up with cardiology -     Spironolactone; Take 1 tablet (50 mg total) by mouth daily.  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  Dispense: 90 tablet; Refill: 3    Return in about 4 months (around 08/20/2022).   [name], MD

## 2022-04-21 NOTE — Patient Instructions (Addendum)
Your hemoglobin A1c was 6.9% this visit.  It was previously 7.5% on 12/21/2021.  Do not forget to schedule your diabetic eye exam.

## 2022-04-25 ENCOUNTER — Telehealth: Payer: Self-pay | Admitting: Hematology and Oncology

## 2022-04-25 NOTE — Telephone Encounter (Signed)
Patient called back to r/s previously cancelled appointment. R.s patient and will forward information to appropriate channels.

## 2022-04-28 ENCOUNTER — Ambulatory Visit: Payer: Medicare HMO | Attending: Internal Medicine

## 2022-04-28 ENCOUNTER — Telehealth: Payer: Self-pay | Admitting: Hematology and Oncology

## 2022-04-28 DIAGNOSIS — I428 Other cardiomyopathies: Secondary | ICD-10-CM | POA: Diagnosis not present

## 2022-04-28 LAB — CUP PACEART REMOTE DEVICE CHECK
Battery Remaining Percentage: 74 %
Date Time Interrogation Session: 20240126070400
Implantable Lead Connection Status: 753985
Implantable Lead Implant Date: 20160707
Implantable Lead Location: 753862
Implantable Lead Model: 3401
Implantable Pulse Generator Implant Date: 20210813
Pulse Gen Serial Number: 143555

## 2022-04-28 NOTE — Telephone Encounter (Signed)
Patient called to try and r/s f/u with new provider that would be covered under her insurance. Forwarded information to supervisors.

## 2022-05-08 ENCOUNTER — Ambulatory Visit: Payer: Medicare HMO | Admitting: Hematology and Oncology

## 2022-05-08 ENCOUNTER — Other Ambulatory Visit: Payer: Medicare HMO

## 2022-05-10 ENCOUNTER — Encounter: Payer: Self-pay | Admitting: Family Medicine

## 2022-05-15 ENCOUNTER — Telehealth: Payer: Self-pay

## 2022-05-15 NOTE — Progress Notes (Signed)
Remote ICD transmission.   

## 2022-05-15 NOTE — Progress Notes (Unsigned)
Patient ID: Paula Stein, female   DOB: 11-16-1945, 77 y.o.   MRN: [id number] Care Management & Coordination Services Pharmacy Team  Reason for Encounter: Diabetes  Contacted patient to discuss diabetes disease state. {US HC Outreach:28874}  Current antihyperglycemic regimen:  Glipizide 2.5 mg 1 tablet daily    Patient verbally confirms she is taking the above medications as directed. {yes/no:20286}  What diet changes have been made to improve diabetes control?  What recent interventions/DTPs have been made to improve glycemic control:  ***  Have there been any recent hospitalizations or ED visits since last visit with PharmD? {yes/no:20286}  Patient {reports/denies:24182} hypoglycemic symptoms, including {Hypoglycemic Symptoms:3049003}  Patient {reports/denies:24182} hyperglycemic symptoms, including {symptoms; hyperglycemia:17903}  How often are you checking your blood sugar? {BG Testing frequency:23922}  What are your blood sugars ranging?  Fasting: *** Before meals: *** After meals: *** Bedtime: ***  During the week, how often does your blood glucose drop below 70? {LowBGfrequency:24142}  Are you checking your feet daily/regularly? {yes/no:20286}  Adherence Review: Is the patient currently on a STATIN medication? {yes/no:20286} Is the patient currently on ACE/ARB medication? {yes/no:20286} Does the patient have >5 day gap between last estimated fill dates? {yes/no:20286}  Reviewed chart prior to disease state call. Spoke with patient regarding BP  Recent Office Vitals: BP Readings from Last 3 Encounters:  04/21/22 (!) 173/87  12/21/21 (!) 144/78  11/24/21 132/63   Pulse Readings from Last 3 Encounters:  04/21/22 70  12/21/21 68  11/24/21 76    Wt Readings from Last 3 Encounters:  04/21/22 229 lb 6.4 oz (104.1 kg)  12/21/21 216 lb (98 kg)  11/23/21 218 lb 11 oz (99.2 kg)     Kidney Function Lab Results  Component Value Date/Time   CREATININE 1.75 (H)  12/21/2021 11:03 AM   CREATININE 1.69 (H) 11/03/2021 09:38 AM   CREATININE 1.40 (H) 08/19/2021 09:40 AM   CREATININE 1.46 (H) 03/31/2020 09:19 AM   CREATININE 0.99 (H) 09/26/2017 12:00 AM   GFR 27.59 (L) 12/21/2021 11:03 AM   GFRNONAA 31 (L) 11/03/2021 09:38 AM   GFRNONAA 35 (L) 03/31/2020 09:19 AM   GFRAA 40 (L) 03/31/2020 09:19 AM       Latest Ref Rng & Units 12/21/2021   11:03 AM 11/03/2021    9:38 AM 09/27/2021   12:00 AM  BMP  Glucose 70 - 99 mg/dL 127  139    BUN 6 - 23 mg/dL 30  33  20      Creatinine 0.40 - 1.20 mg/dL 1.75  1.69    Sodium 135 - 145 mEq/L 140  140  141      Potassium 3.5 - 5.1 mEq/L 4.0  3.8  4.3      Chloride 96 - 112 mEq/L 104  106  108      CO2 19 - 32 mEq/L 25  26  27      $ Calcium 8.4 - 10.5 mg/dL 9.9  9.5  9.2         This result is from an external source.    Current antihypertensive regimen:   Spironolactone 50 mg 1 tablet daily  Carvedilol 25 mg 1 tablet twice daily   How often are you checking your Blood Pressure? {CHL HP BP Monitoring Frequency:5036151715} Current home BP readings: *** What recent interventions/DTPs have been made by any provider to improve Blood Pressure control since last CPP Visit: Entresto 97-103 mg 1 tablet twice daily  per chart notes patient stopped taking in  Dec of 2023 Any recent hospitalizations or ED visits since last visit with CPP? {yes/no:20286} What diet changes have been made to improve Blood Pressure Control?  *** What exercise is being done to improve your Blood Pressure Control?    Chart Updates:  Recent office visits:  04/21/22 [name], MD - Patient presented for essential hypertension and other concerns. Stopped Emollient. Stopped Ergocalciferol. Stopped Xarelto  Recent consult visits:   05/24/22 Parrett, Fonnie Mu, NP (Pulmonology) - Patient presented for Complex sleep apnea syndrome. No medication changes.  05/16/22 Orson Slick, MD (Hematology/ Oncology) - Patient presented for Portal  vein thrombosis and other concerns. No medication changes Pt discontinued Xarelto per notes, recommended to start baby Asprin daily.  Hospital visits:  Medication Reconciliation was completed by comparing discharge summary, patient's EMR and Pharmacy list, and upon discussion with patient.   Patient presented to [facility] on 11/24/21 due to Lower Lip Swelling. Patient was present for 55 min.   New?Medications Started at [facility] Discharge:?? -started  Aquaphor   Medication Changes at Hospital Discharge: -Changed  none   Medications Discontinued at Hospital Discharge: -Stopped  none   Medications that remain the same after Hospital Discharge:??  -All other medications will remain the same.       Patient presented to [facility] on 11/10/21 Colonoscopy with Propofol. Patient was present for 3 hours.   New?Medications Started at [facility] Discharge:?? -started  none   Medication Changes at Hospital Discharge: -Changed  none   Medications Discontinued at Hospital Discharge: -Stopped  none   Medications that remain the same after Hospital Discharge:??  -All other medications will remain the same.    Medications: Outpatient Encounter Medications as of 05/15/2022  Medication Sig   ACCU-CHEK GUIDE test strip USE TO CHECK BLOOD SUGAR DAILY   Accu-Chek Softclix Lancets lancets USE TO CHECK BLOOD SUGAR DAILY   allopurinol (ZYLOPRIM) 100 MG tablet Take 1 tablet (100 mg total) by mouth daily.   Blood Glucose Monitoring Suppl (ACCU-CHEK GUIDE) w/Device KIT 1 each by Does not apply route daily. Use to check blood sugar daily   Calcium Carb-Cholecalciferol (CALCIUM 600+D3 PO) Take 1 tablet by mouth daily.   carvedilol (COREG) 25 MG tablet Take 1 tablet (25 mg total) by mouth 2 (two) times daily with a meal.   furosemide (LASIX) 40 MG tablet Take 1 tablet (40 mg total) by mouth 2 (two) times daily.   glipiZIDE (GLUCOTROL XL) 2.5 MG 24 hr tablet Take  1 tablet (2.5 mg total) by mouth daily with breakfast.   isosorbide mononitrate (IMDUR) 30 MG 24 hr tablet Take 1 tablet (30 mg total) by mouth daily.   levothyroxine (SYNTHROID) 125 MCG tablet TAKE 1 TABLET EVERY MORNING ON AN EMPTY STOMACH 30 MINUTES BEFORE TAKING OTHER MEDICATIONS OR BEFORE BREAKFAST   sacubitril-valsartan (ENTRESTO) 97-103 MG Take 1 tablet by mouth 2 (two) times daily.   spironolactone (ALDACTONE) 50 MG tablet Take 1 tablet (50 mg total) by mouth daily.   traZODone (DESYREL) 50 MG tablet Take 0.5-1 tablets (25-50 mg total) by mouth at bedtime as needed for sleep.   No facility-administered encounter medications on file as of 05/15/2022.     Star Rating Drugs:  Glipizide 2.5 mg - last filled 04/10/22 90 DS at [facility]: AWV- 10/21/21 Eye Exam - Overdue COVID Booster - Overdue Next Pharmacist Visit - 07/17/22   [place] Clinical Pharmacist Assistant [phone]

## 2022-05-16 ENCOUNTER — Inpatient Hospital Stay (HOSPITAL_BASED_OUTPATIENT_CLINIC_OR_DEPARTMENT_OTHER): Payer: Medicare HMO | Admitting: Hematology and Oncology

## 2022-05-16 ENCOUNTER — Other Ambulatory Visit: Payer: Self-pay | Admitting: Hematology and Oncology

## 2022-05-16 ENCOUNTER — Inpatient Hospital Stay: Payer: Medicare HMO | Attending: Hematology and Oncology

## 2022-05-16 VITALS — BP 187/70 | HR 74 | Temp 98.3°F | Resp 14 | Wt 224.5 lb

## 2022-05-16 DIAGNOSIS — Z720 Tobacco use: Secondary | ICD-10-CM | POA: Diagnosis not present

## 2022-05-16 DIAGNOSIS — D689 Coagulation defect, unspecified: Secondary | ICD-10-CM | POA: Diagnosis not present

## 2022-05-16 DIAGNOSIS — I81 Portal vein thrombosis: Secondary | ICD-10-CM | POA: Insufficient documentation

## 2022-05-16 LAB — CMP (CANCER CENTER ONLY)
ALT: 7 U/L (ref 0–44)
AST: 10 U/L — ABNORMAL LOW (ref 15–41)
Albumin: 3.9 g/dL (ref 3.5–5.0)
Alkaline Phosphatase: 91 U/L (ref 38–126)
Anion gap: 8 (ref 5–15)
BUN: 24 mg/dL — ABNORMAL HIGH (ref 8–23)
CO2: 26 mmol/L (ref 22–32)
Calcium: 9.5 mg/dL (ref 8.9–10.3)
Chloride: 108 mmol/L (ref 98–111)
Creatinine: 1.5 mg/dL — ABNORMAL HIGH (ref 0.44–1.00)
GFR, Estimated: 35 mL/min — ABNORMAL LOW (ref >60)
Glucose, Bld: 134 mg/dL — ABNORMAL HIGH (ref 70–99)
Potassium: 3.8 mmol/L (ref 3.5–5.1)
Sodium: 142 mmol/L (ref 135–145)
Total Bilirubin: 0.5 mg/dL (ref 0.3–1.2)
Total Protein: 7.9 g/dL (ref 6.5–8.1)

## 2022-05-16 LAB — CBC WITH DIFFERENTIAL (CANCER CENTER ONLY)
Abs Immature Granulocytes: 0.03 10*3/uL (ref 0.00–0.07)
Basophils Absolute: 0 10*3/uL (ref 0.0–0.1)
Basophils Relative: 1 %
Eosinophils Absolute: 0 10*3/uL (ref 0.0–0.5)
Eosinophils Relative: 1 %
HCT: 39.5 % (ref 36.0–46.0)
Hemoglobin: 12.6 g/dL (ref 12.0–15.0)
Immature Granulocytes: 0 %
Lymphocytes Relative: 29 %
Lymphs Abs: 2.1 10*3/uL (ref 0.7–4.0)
MCH: 30.5 pg (ref 26.0–34.0)
MCHC: 31.9 g/dL (ref 30.0–36.0)
MCV: 95.6 fL (ref 80.0–100.0)
Monocytes Absolute: 0.5 10*3/uL (ref 0.1–1.0)
Monocytes Relative: 7 %
Neutro Abs: 4.6 10*3/uL (ref 1.7–7.7)
Neutrophils Relative %: 62 %
Platelet Count: 256 10*3/uL (ref 150–400)
RBC: 4.13 MIL/uL (ref 3.87–5.11)
RDW: 14.5 % (ref 11.5–15.5)
WBC Count: 7.4 10*3/uL (ref 4.0–10.5)
nRBC: 0 % (ref 0.0–0.2)

## 2022-05-16 NOTE — Progress Notes (Signed)
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  03/17/2020: started Xarelto therapy.  01/13/2021: last visit with [name]  11/03/2021: transition care to Dr. Lorenso Courier   Interval History:  Paula Stein 77 y.o. female with medical history significant for port vein thrombosis who presents for a follow up visit. The patient's last visit was on 11/03/2021. In the interim since the last visit she has self discontinued on Xarelto therapy in Dec 2023.  On exam today Paula Stein reports that she stopped her Xarelto in December by herself.  She reports has been doing fine since that time.  She reports that she stopped it altogether due to the high cost of the medication.  She reports that she was paying $375 a month for all of her medications and outpaced $147 a month for 63-monthsupply of medications.  She reports that she contacted the company that went through patient assistance but was not able to make any progress.  She reports that she did not have any problems Xarelto therapy such as bleeding, bruising, or dark stools.  She notes that she does have some occasional leg cramps at night.  She is following with nephrology and has an appointment coming up in March 2024.  Otherwise she has been at her baseline level of health.  She notes she is not having any nausea,  vomiting, or diarrhea.  A full 10 point ROS is listed below.   MEDICAL HISTORY:  Past Medical History:  Diagnosis Date   AICD (automatic cardioverter/defibrillator) present 10/08/2014   SUBQ    /    DR KCaryl Comes  Cataract    CHF (congestive heart failure) (HCC)    Diabetes mellitus without complication (HCC)    GERD (gastroesophageal reflux disease)    History of cardiac cath 05/05/2007   normal-with patent coronaries   History of colonoscopy    History of hiatal hernia    Hyperlipidemia    Hypertension    Kidney failure    Stage 3   Non-ischemic cardiomyopathy (HCC)    EF 28%- reassessment of LV function 2011 with LVEf 45-50%   PAD (peripheral artery disease) (HCC)    lower extremities with ABIs of 0.5 bilaterally   Personal history of goiter    S/P radioactive iodine thyroid ablation    Sleep apnea    uses CPAP    SURGICAL HISTORY: Past Surgical History:  Procedure Laterality Date   BIOPSY  10/13/2020   Procedure: BIOPSY;  Surgeon: CLavena Bullion DO;  Location: [facility];  Service: Gastroenterology;;   BIOPSY  11/10/2021   Procedure: BIOPSY;  Surgeon: CLavena Bullion DO;  Location: [facility];  Service: Gastroenterology;;   CATARACT EXTRACTION Bilateral    COLONOSCOPY WITH PROPOFOL N/A 10/13/2020   Procedure: COLONOSCOPY WITH PROPOFOL;  Surgeon: CLavena Bullion DO;  Location: [facility];  Service: Gastroenterology;  Laterality: N/A;   COLONOSCOPY WITH PROPOFOL N/A 11/10/2021   Procedure: COLONOSCOPY WITH PROPOFOL;  Surgeon:  [name], Dominic Pea, DO;  Location: [facility];  Service: Gastroenterology;  Laterality: N/A;   EP IMPLANTABLE DEVICE N/A 10/08/2014   Procedure: SubQ ICD Implant;  Surgeon: [name], MD;  Location: [facility];  Service: Cardiovascular;  Laterality: N/A;   POLYPECTOMY  10/13/2020   Procedure: POLYPECTOMY;  Surgeon: [name], DO;  Location: [facility];  Service: Gastroenterology;;   [name] ICD CHANGEOUT N/A  11/14/2019   Procedure: [id number] ICD CHANGEOUT;  Surgeon: [name], MD;  Location: [place];  Service: Cardiovascular;  Laterality: N/A;   THYROIDECTOMY     TONSILLECTOMY     TOTAL ABDOMINAL HYSTERECTOMY      SOCIAL HISTORY: Social History   Socioeconomic History   Marital status: Divorced    Spouse name: Not on file   Number of children: 1   Years of education: Not on file   Highest education level: Some college, no degree  Occupational History   Occupation: Retired    Fish farm manager: RETIRED    Comment: Education officer, museum  Tobacco Use   Smoking status: Light Smoker    Packs/day: 0.25    Years: 40.00    Total pack years: 10.00    Types: Cigarettes   Smokeless tobacco: Never   Tobacco comments:    on and off,currently a pack / week ( 10/05/20)   Does 1 cigarette daily (10/05/2021)  Vaping Use   Vaping Use: Never used  Substance and Sexual Activity   Alcohol use: Yes    Alcohol/week: 0.0 standard drinks of alcohol    Comment: rare glass of wine   Drug use: No   Sexual activity: Not Currently    Birth control/protection: None  Other Topics Concern   Not on file  Social History Narrative   Retired Education officer, museum   Divorced - one grown son   current smoker    Alcohol use-no      Drug use-no    Regular exercise- no     son - Makhala Parkman   Social Determinants of Health   Financial Resource Strain: Low Risk  (03/06/2022)   Overall Financial Resource Strain (CARDIA)    Difficulty of Paying Living Expenses: Not very hard  Food Insecurity: No Food Insecurity (10/21/2021)   Hunger Vital Sign    Worried About Running Out of Food in the Last Year: Never true    Ran Out of Food in the Last Year: Never true  Transportation Needs: No Transportation Needs (10/21/2021)   PRAPARE - Hydrologist (Medical): No    Lack of Transportation (Non-Medical): No  Physical Activity: Insufficiently Active (10/21/2021)   Exercise Vital Sign    Days of Exercise  per Week: 1 day    Minutes of Exercise per Session: 10 min  Stress: No Stress Concern Present (10/21/2021)   [place]    Feeling of Stress : Only a little  Social Connections: Moderately Integrated (10/21/2021)   Social Connection and Isolation Panel [NHANES]    Frequency of Communication with Friends and Family: More than three times a week    Frequency of Social Gatherings with Friends and Family: More than three times a week    Attends Religious Services: More than 4 times per year    Active Member of [employer] or Organizations: Yes    Attends Archivist Meetings: 1 to 4 times per year    Marital Status: Divorced  Intimate  Partner Violence: Not At Risk (10/21/2021)   Humiliation, Afraid, Rape, and Kick questionnaire    Fear of Current or Ex-Partner: No    Emotionally Abused: No    Physically Abused: No    Sexually Abused: No    FAMILY HISTORY: Family History  Problem Relation Age of Onset   Diabetes type II Mother    Hypertension Mother    Diabetes Mother    Heart disease Mother    Other Father        deceased from accident age 48   Hyperlipidemia Father    Breast cancer Neg Hx    Stomach cancer Neg Hx    Colon cancer Neg Hx    Esophageal cancer Neg Hx    Pancreatic cancer Neg Hx     ALLERGIES:  is allergic to atorvastatin, farxiga [dapagliflozin], ibuprofen, and jardiance [empagliflozin].  MEDICATIONS:  Current Outpatient Medications  Medication Sig Dispense Refill   ACCU-CHEK GUIDE test strip USE TO CHECK BLOOD SUGAR DAILY 100 strip 1   Accu-Chek Softclix Lancets lancets USE TO CHECK BLOOD SUGAR DAILY 100 each 1   allopurinol (ZYLOPRIM) 100 MG tablet Take 1 tablet (100 mg total) by mouth daily. 90 tablet 1   Blood Glucose Monitoring Suppl (ACCU-CHEK GUIDE) w/Device KIT 1 each by Does not apply route daily. Use to check blood sugar daily 1 kit 0   Calcium Carb-Cholecalciferol (CALCIUM 600+D3 PO)  Take 1 tablet by mouth daily.     carvedilol (COREG) 25 MG tablet Take 1 tablet (25 mg total) by mouth 2 (two) times daily with a meal. 180 tablet 3   furosemide (LASIX) 40 MG tablet Take 1 tablet (40 mg total) by mouth 2 (two) times daily. 180 tablet 3   glipiZIDE (GLUCOTROL XL) 2.5 MG 24 hr tablet Take 1 tablet (2.5 mg total) by mouth daily with breakfast. 90 tablet 1   isosorbide mononitrate (IMDUR) 30 MG 24 hr tablet Take 1 tablet (30 mg total) by mouth daily. 90 tablet 3   levothyroxine (SYNTHROID) 125 MCG tablet TAKE 1 TABLET EVERY MORNING ON AN EMPTY STOMACH 30 MINUTES BEFORE TAKING OTHER MEDICATIONS OR BEFORE BREAKFAST 90 tablet 1   sacubitril-valsartan (ENTRESTO) 97-103 MG Take 1 tablet by mouth 2 (two) times daily. 180 tablet 3   spironolactone (ALDACTONE) 50 MG tablet Take 1 tablet (50 mg total) by mouth daily. 90 tablet 3   traZODone (DESYREL) 50 MG tablet Take 0.5-1 tablets (25-50 mg total) by mouth at bedtime as needed for sleep. 30 tablet 1   No current facility-administered medications for this visit.    REVIEW OF SYSTEMS:   Constitutional: ( - ) fevers, ( - )  chills , ( - ) night sweats Eyes: ( - ) blurriness of vision, ( - ) double vision, ( - ) watery eyes Ears, nose, mouth, throat, and face: ( - ) mucositis, ( - ) sore throat Respiratory: ( - ) cough, ( - ) dyspnea, ( - ) wheezes Cardiovascular: ( - ) palpitation, ( - ) chest discomfort, ( - ) lower extremity swelling Gastrointestinal:  ( - ) nausea, ( - ) heartburn, ( - ) change in bowel habits Skin: ( - ) abnormal skin rashes Lymphatics: ( - ) new lymphadenopathy, ( - ) easy bruising Neurological: ( - ) numbness, ( - ) tingling, ( - ) new weaknesses Behavioral/Psych: ( - ) mood change, ( - ) new changes  All other systems were reviewed with the patient and are negative.  PHYSICAL EXAMINATION:  Vitals:   05/16/22 0903  BP: (!) 187/70  Pulse: 74  Resp: 14  Temp: 98.3 F (36.8 C)  SpO2: 97%    Filed Weights    05/16/22 0903  Weight: 224 lb 8 oz (101.8 kg)     GENERAL: Well-appearing elderly African-American female, alert, no distress and comfortable SKIN: skin color, texture, turgor are normal, no rashes or significant lesions EYES: conjunctiva are pink and non-injected, sclera clear LUNGS: clear to auscultation and percussion with normal breathing effort HEART: regular rate & rhythm and no murmurs and no lower extremity edema Musculoskeletal: no cyanosis of digits and no clubbing  PSYCH: alert & oriented x 3, fluent speech NEURO: no focal motor/sensory deficits  LABORATORY DATA:  I have reviewed the data as listed    Latest Ref Rng & Units 05/16/2022    8:27 AM 12/21/2021   11:03 AM 11/03/2021    9:38 AM  CBC  WBC 4.0 - 10.5 K/uL 7.4  7.3  7.8   Hemoglobin 12.0 - 15.0 g/dL 12.6  12.1  12.2   Hematocrit 36.0 - 46.0 % 39.5  37.5  38.0   Platelets 150 - 400 K/uL 256  281.0  251        Latest Ref Rng & Units 05/16/2022    8:27 AM 12/21/2021   11:03 AM 11/03/2021    9:38 AM  CMP  Glucose 70 - 99 mg/dL 134  127  139   BUN 8 - 23 mg/dL 24  30  33   Creatinine 0.44 - 1.00 mg/dL 1.50  1.75  1.69   Sodium 135 - 145 mmol/L 142  140  140   Potassium 3.5 - 5.1 mmol/L 3.8  4.0  3.8   Chloride 98 - 111 mmol/L 108  104  106   CO2 22 - 32 mmol/L 26  25  26   $ Calcium 8.9 - 10.3 mg/dL 9.5  9.9  9.5   Total Protein 6.5 - 8.1 g/dL 7.9   8.2   Total Bilirubin 0.3 - 1.2 mg/dL 0.5   0.5   Alkaline Phos 38 - 126 U/L 91   93   AST 15 - 41 U/L 10   10   ALT 0 - 44 U/L 7   7     RADIOGRAPHIC STUDIES: CUP PACEART REMOTE DEVICE CHECK  Result Date: 04/28/2022 Scheduled remote reviewed. Normal device function.  Next remote 91 days. LASensing Configuration: Alternate Gain Setting: 1X Post Shock Pacing: OFF   ASSESSMENT & PLAN Paula Stein 77 y.o. female with medical history significant for port vein thrombosis who presents for a follow up visit.   Previously we had a detailed discussion about her  portal vein thrombosis.  This time it appears it was provoked by an episode of diverticulitis.  She has no mutations underlying the disease and therefore think a limited duration of anticoagulation would be reasonable.  However we cannot say for certain that diverticulitis was the etiology of her VTE.  It is possible that the VTE was incidentally discovered during episode of diverticulitis.  The safest thing to do would be to continue anticoagulation assuming this is an unprovoked VTE.  The patient notes given the risks and benefits she would like to continue Xarelto therapy.  We discussed decreasing down to maintenance dose and she was delayed by the thought that we can decrease to Xarelto 10 mg p.o. daily.  Fortunately the patient was not able to for the medication and  stopped it altogether in December 2023.  Given that there is a high probability that this was a provoked VTE I think would be reasonable to discontinue anticoagulation altogether.  I do not feel strongly about converting to Coumadin therapy.  I would however recommend baby aspirin 81 mg p.o. daily for some level of protection.  The patient voiced understanding of this plan moving forward.  #Portal Vein Thrombosis in Setting of Diverticulitis -- At this time findings appear most consistent with a portal vein thrombosis potentially provoked by an episode of diverticulitis. --Patient self discontinued Xarelto 20 mg p.o. daily.  Notes the cost is inhibitive and she did not qualify for patient assistance --recommend ASA 81 mg PO daily (noted this is not the same level of protection as Xarelto, but may provide some benefit in preventing future clots).  --No evidence of hypercoagulation disorder on prior focused hypercoagulation work-up --Recommend return to clinic on an as needed basis. Strict return precautions for signs/symptoms of recurrent VTE.   No orders of the defined types were placed in this encounter.   All questions were answered.  The patient knows to call the clinic with any problems, questions or concerns.  A total of more than 30 minutes were spent on this encounter with face-to-face time and non-face-to-face time, including preparing to see the patient, ordering tests and/or medications, counseling the patient and coordination of care as outlined above.   Ledell Peoples, MD Department of Hematology/Oncology [place] [facility] Phone: [phone] Pager: [phone] Email: [email or web address]  05/16/2022 9:30 AM

## 2022-05-24 ENCOUNTER — Encounter: Payer: Self-pay | Admitting: Adult Health

## 2022-05-24 ENCOUNTER — Ambulatory Visit (INDEPENDENT_AMBULATORY_CARE_PROVIDER_SITE_OTHER): Payer: Medicare HMO | Admitting: Adult Health

## 2022-05-24 ENCOUNTER — Telehealth: Payer: Self-pay | Admitting: Adult Health

## 2022-05-24 ENCOUNTER — Telehealth: Payer: Self-pay | Admitting: *Deleted

## 2022-05-24 VITALS — BP 144/80 | HR 54 | Temp 97.7°F | Ht 70.0 in | Wt 224.0 lb

## 2022-05-24 DIAGNOSIS — G4731 Primary central sleep apnea: Secondary | ICD-10-CM | POA: Diagnosis not present

## 2022-05-24 NOTE — Assessment & Plan Note (Addendum)
Complex sleep apnea with previous excellent control compliance on BiPAP.  Patient unfortunately has been unable to get supplies for the last several months.  Will need to restart immediately and order for supplies has been sent to her DME company.  She needs to reestablish with local DME, she wants to use [facility] which is closer to her new address. Will continue previous settings auto BiPAP IPAP max 18 cm, EPAP minimum 14 cm - discussed how weight can impact sleep and risk for sleep disordered breathing - discussed options to assist with weight loss: combination of diet modification, cardiovascular and strength training exercises   - had an extensive discussion regarding the adverse health consequences related to untreated sleep disordered breathing - specifically discussed the risks for hypertension, coronary artery disease, cardiac dysrhythmias, cerebrovascular disease, and diabetes - lifestyle modification discussed   - discussed how sleep disruption can increase risk of accidents, particularly when driving - safe driving practices were discussed     Plan  Patient Instructions  Restart BIPAP At bedtime  , wear all night long Order for supplies .  Work on healthy weight .  Do not drive if sleepy .  Follow up with Dr. Halford Chessman or Eran Windish NP in 3 months and As needed

## 2022-05-24 NOTE — Patient Instructions (Signed)
Restart BIPAP At bedtime  , wear all night long Order for supplies .  Work on healthy weight .  Do not drive if sleepy .  Follow up with Dr. Halford Chessman or Loriel Diehl NP in 3 months and As needed

## 2022-05-24 NOTE — Telephone Encounter (Signed)
Pt.was seen this morning  Missing disc from CPAP machine and was told to call back if need one

## 2022-05-24 NOTE — Addendum Note (Signed)
Addended by: Vanessa Barbara on: 05/24/2022 12:32 PM   Modules accepted: Orders

## 2022-05-24 NOTE — Progress Notes (Signed)
Reviewed and agree with assessment/plan.   Chesley Mires, MD [facility] Pulmonary/Critical Care 05/24/2022, 1:50 PM Pager:  [phone]

## 2022-05-24 NOTE — Telephone Encounter (Signed)
ATC x1.  LMTCB. 

## 2022-05-24 NOTE — Progress Notes (Signed)
$@Patientu$  ID: Paula Stein, female    DOB: 06-13-1945, 77 y.o.   MRN: [id number]  Chief Complaint  Patient presents with   Consult    Referring provider: [name], MD  HPI: 77 year old female , active smoker, followed for complex sleep apnea.  Last seen May 2019, reestablished May 24, 2022.  Medical history significant for congestive heart failure, diabetes, hypothyroidism, Chronic kidney disease, hx of portal vein thrombosis (prev on xarelto-off 03/2022)  TEST/EVENTS :  PSG 09/11/12 >> AHI 68.6 PSG 09/13/16 >> AHI 50.8, SpO2 low 86%.  Treatment emergent centrals.  Bipap 18/14 cm H2O  Echo 07/22/14 >> EF 30 to 35%, mod LVH, grade 1 DD, mild MR, mod RV systolic dysfx   Echo July 07, 2021 -EF 30 to [id number], grade 1 diastolic dysfunction, LV global hypokinesis, right ventricular systolic moderately decreased, pericardial effusion  05/24/2022 Sleep consult -complex sleep apnea Patient presents for a sleep consult today.  Patient is here to reestablish for complex sleep apnea.  Patient was last seen May 2019.  Has a history of complex sleep apnea previously on BiPAP.  Patient says she has been using her BiPAP every single night up until about 6 months ago.  Patient says she ran out of her BiPAP supplies and was unable to continue to use her device.  Patient says she moved and has not gotten new supplies at new address. .  She is here today to get restarted back onto her BiPAP.  Says she feels bad not wearing BIPAP, sleep is restless and feels tired. BIPAP machine is around 79yr old, works great. Just needs new supplies . Patient says that it is hard to fall asleep and wakes up several times throughout the night.  Typically goes to bed about 11 PM.  Takes about 30 minutes to go to sleep.  Is up at 8 AM.  Last sleep study was June 2018 with AHI at 50.8/hour and SpO2 low at 86%.  Titration portion showed treatment emergent centrals.  BiPAP at 18 over 14 cm of H2O. Last visit noted patient had  excellent compliance with daily average usage at 6.5 hours.  Previously on IPAP of 18, EPAP of 14 cm H2O.  An AHI was 6.1. Epworth score is 2 out of 24.  Typically gets sleepy if she sits down to watch TV and in the evening hours.    Allergies  Allergen Reactions   Atorvastatin Other (See Comments)    Cramping   Farxiga [Dapagliflozin] Rash   Ibuprofen Nausea Only   Jardiance [Empagliflozin] Itching    Immunization History  Administered Date(s) Administered   Fluad Quad(high Dose 65+) 12/24/2018, 12/15/2019, 01/14/2021, 12/21/2021   Influenza Split 02/15/2011   Influenza Whole 08/22/2004, 02/18/2007, 12/31/2008, 02/17/2010, 01/02/2012   Influenza, High Dose Seasonal PF 01/04/2015, 12/11/2016, 01/03/2018   Influenza,inj,Quad PF,6+ Mos 06/26/2013, 02/03/2014   Influenza-Unspecified 02/02/2016   Moderna Sars-Covid-2 Vaccination 04/18/2019, 05/16/2019, 02/03/2020, 09/14/2020, 01/04/2021   Pneumococcal Conjugate-13 06/26/2013   Pneumococcal Polysaccharide-23 08/22/2004, 02/03/2014   Td 12/22/2002   Tdap 06/26/2013, 01/18/2021   Zoster Recombinat (Shingrix) 10/22/2019, 01/02/2020   Zoster, Live 07/24/2014    Past Medical History:  Diagnosis Date   AICD (automatic cardioverter/defibrillator) present 10/08/2014   SUBQ    /    DR KCaryl Comes  Cataract    CHF (congestive heart failure) (HCC)    Diabetes mellitus without complication (HCC)    GERD (gastroesophageal reflux disease)    History of cardiac cath 05/05/2007  normal-with patent coronaries   History of colonoscopy    History of hiatal hernia    Hyperlipidemia    Hypertension    Kidney failure    Stage 3   Non-ischemic cardiomyopathy (HCC)    EF 28%- reassessment of LV function 2011 with LVEf 45-50%   PAD (peripheral artery disease) ([place])    lower extremities with ABIs of 0.5 bilaterally   Personal history of goiter    S/P radioactive iodine thyroid ablation    Sleep apnea    uses CPAP   Past Surgical History:   Procedure Laterality Date   BIOPSY  10/13/2020   Procedure: BIOPSY;  Surgeon: [name], DO;  Location: [facility];  Service: Gastroenterology;;   BIOPSY  11/10/2021   Procedure: BIOPSY;  Surgeon: [name], DO;  Location: [facility];  Service: Gastroenterology;;   CATARACT EXTRACTION Bilateral    COLONOSCOPY WITH PROPOFOL N/A 10/13/2020   Procedure: COLONOSCOPY WITH PROPOFOL;  Surgeon: [name], DO;  Location: [facility];  Service: Gastroenterology;  Laterality: N/A;   COLONOSCOPY WITH PROPOFOL N/A 11/10/2021   Procedure: COLONOSCOPY WITH PROPOFOL;  Surgeon: [name], DO;  Location: [facility];  Service: Gastroenterology;  Laterality: N/A;   EP IMPLANTABLE DEVICE N/A 10/08/2014   Procedure: SubQ ICD Implant;  Surgeon: [name], MD;  Location: [place] [facility];  Service: Cardiovascular;  Laterality: N/A;   POLYPECTOMY  10/13/2020   Procedure: POLYPECTOMY;  Surgeon: [name], DO;  Location: [facility];  Service: Gastroenterology;;   [name] ICD CHANGEOUT N/A 11/14/2019   Procedure: [id number] ICD CHANGEOUT;  Surgeon: [name], MD;  Location: [place] [facility];  Service: Cardiovascular;  Laterality: N/A;   THYROIDECTOMY     TONSILLECTOMY     TOTAL ABDOMINAL HYSTERECTOMY      Tobacco History: Social History   Tobacco Use  Smoking Status Light Smoker   Years: 40.00   Types: Cigarettes  Smokeless Tobacco Never  Tobacco Comments   Smokes a pack per week.  05/24/2022 hfb   Ready to quit: No Counseling given: Yes Tobacco comments: Smokes a pack per week.  05/24/2022 hfb  Lives with son. Drives, Independent . Works own Stage [employer]. Smokes 1 PP week. No alcohol, no drugs.   FH : Heart disease and RA   Outpatient Medications Prior to Visit  Medication Sig Dispense Refill   ACCU-CHEK GUIDE test strip USE TO CHECK BLOOD SUGAR DAILY 100 strip 1   Accu-Chek Softclix Lancets lancets USE TO CHECK BLOOD SUGAR DAILY 100 each 1    allopurinol (ZYLOPRIM) 100 MG tablet Take 1 tablet (100 mg total) by mouth daily. 90 tablet 1   Blood Glucose Monitoring Suppl (ACCU-CHEK GUIDE) w/Device KIT 1 each by Does not apply route daily. Use to check blood sugar daily 1 kit 0   Calcium Carb-Cholecalciferol (CALCIUM 600+D3 PO) Take 1 tablet by mouth daily.     carvedilol (COREG) 25 MG tablet Take 1 tablet (25 mg total) by mouth 2 (two) times daily with a meal. 180 tablet 3   furosemide (LASIX) 40 MG tablet Take 1 tablet (40 mg total) by mouth 2 (two) times daily. 180 tablet 3   glipiZIDE (GLUCOTROL XL) 2.5 MG 24 hr tablet Take 1 tablet (2.5 mg total) by mouth daily with breakfast. 90 tablet 1   isosorbide mononitrate (IMDUR) 30 MG 24 hr tablet Take 1 tablet (30 mg total) by mouth daily. 90 tablet 3   levothyroxine (SYNTHROID) 125  MCG tablet TAKE 1 TABLET EVERY MORNING ON AN EMPTY STOMACH 30 MINUTES BEFORE TAKING OTHER MEDICATIONS OR BEFORE BREAKFAST 90 tablet 1   sacubitril-valsartan (ENTRESTO) 97-103 MG Take 1 tablet by mouth 2 (two) times daily. 180 tablet 3   spironolactone (ALDACTONE) 50 MG tablet Take 1 tablet (50 mg total) by mouth daily. 90 tablet 3   traZODone (DESYREL) 50 MG tablet Take 0.5-1 tablets (25-50 mg total) by mouth at bedtime as needed for sleep. 30 tablet 1   No facility-administered medications prior to visit.     Review of Systems:   Constitutional:   No  weight loss, night sweats,  Fevers, chills,  +fatigue, or  lassitude.  HEENT:   No headaches,  Difficulty swallowing,  Tooth/dental problems, or  Sore throat,                No sneezing, itching, ear ache, nasal congestion, post nasal drip,   CV:  No chest pain,  Orthopnea, PND, swelling in lower extremities, anasarca, dizziness, palpitations, syncope.   GI  No heartburn, indigestion, abdominal pain, nausea, vomiting, diarrhea, change in bowel habits, loss of appetite, bloody stools.   Resp: No shortness of breath with exertion or at rest.  No excess  mucus, no productive cough,  No non-productive cough,  No coughing up of blood.  No change in color of mucus.  No wheezing.  No chest wall deformity  Skin: no rash or lesions.  GU: no dysuria, change in color of urine, no urgency or frequency.  No flank pain, no hematuria   MS:  No joint pain or swelling.  No decreased range of motion.  No back pain.    Physical Exam  BP (!) 144/80 (BP Location: Left Arm, Patient Position: Sitting, Cuff Size: Large)   Pulse (!) 54   Temp 97.7 F (36.5 C) (Oral)   Ht 5' 10"$  (1.778 m)   Wt 224 lb (101.6 kg)   SpO2 99%   BMI 32.14 kg/m   GEN: A/Ox3; pleasant , NAD, well nourished    HEENT:  [place]/AT,  NOSE-clear, THROAT-clear, no lesions, no postnasal drip or exudate noted.   NECK:  Supple w/ fair ROM; no JVD; normal carotid impulses w/o bruits; no thyromegaly or nodules palpated; no lymphadenopathy.    RESP  Clear  P & A; w/o, wheezes/ rales/ or rhonchi. no accessory muscle use, no dullness to percussion  CARD:  RRR, no m/r/g, no peripheral edema, pulses intact, no cyanosis or clubbing.  GI:   Soft & nt; nml bowel sounds; no organomegaly or masses detected.   Musco: Warm bil, no deformities or joint swelling noted.   Neuro: alert, no focal deficits noted.    Skin: Warm, no lesions or rashes    Lab Results:  CBC    Component Value Date/Time   WBC 7.4 05/16/2022 0827   WBC 7.3 12/21/2021 1103   RBC 4.13 05/16/2022 0827   HGB 12.6 05/16/2022 0827   HCT 39.5 05/16/2022 0827   PLT 256 05/16/2022 0827   MCV 95.6 05/16/2022 0827   MCH 30.5 05/16/2022 0827   MCHC 31.9 05/16/2022 0827   RDW 14.5 05/16/2022 0827   LYMPHSABS 2.1 05/16/2022 0827   MONOABS 0.5 05/16/2022 0827   EOSABS 0.0 05/16/2022 0827   BASOSABS 0.0 05/16/2022 0827    BMET    Component Value Date/Time   NA 142 05/16/2022 0827   NA 141 09/27/2021 0000   K 3.8 05/16/2022 0827   CL 108  05/16/2022 0827   CO2 26 05/16/2022 0827   GLUCOSE 134 (H) 05/16/2022 0827    BUN 24 (H) 05/16/2022 0827   BUN 20 09/27/2021 0000   CREATININE 1.50 (H) 05/16/2022 0827   CREATININE 1.46 (H) 03/31/2020 0919   CALCIUM 9.5 05/16/2022 0827   GFRNONAA 35 (L) 05/16/2022 0827   GFRNONAA 35 (L) 03/31/2020 0919   GFRAA 40 (L) 03/31/2020 0919    BNP    Component Value Date/Time   BNP 533.5 (H) 07/06/2021 1938    ProBNP    Component Value Date/Time   PROBNP 96.0 07/21/2021 1115    Imaging: CUP PACEART REMOTE DEVICE CHECK  Result Date: 04/28/2022 Scheduled remote reviewed. Normal device function.  Next remote 91 days. LASensing Configuration: Alternate Gain Setting: 1X Post Shock Pacing: OFF         No data to display          No results found for: "NITRICOXIDE"      Assessment & Plan:   Complex sleep apnea syndrome Complex sleep apnea with previous excellent control compliance on BiPAP.  Patient unfortunately has been unable to get supplies for the last several months.  Will need to restart immediately and order for supplies has been sent to her DME company.  She needs to reestablish with local DME, she wants to use [facility] which is closer to her new address. Will continue previous settings auto BiPAP IPAP max 18 cm, EPAP minimum 14 cm - discussed how weight can impact sleep and risk for sleep disordered breathing - discussed options to assist with weight loss: combination of diet modification, cardiovascular and strength training exercises   - had an extensive discussion regarding the adverse health consequences related to untreated sleep disordered breathing - specifically discussed the risks for hypertension, coronary artery disease, cardiac dysrhythmias, cerebrovascular disease, and diabetes - lifestyle modification discussed   - discussed how sleep disruption can increase risk of accidents, particularly when driving - safe driving practices were discussed     Plan  Patient Instructions  Restart BIPAP At bedtime  , wear all night  long Order for supplies .  Work on healthy weight .  Do not drive if sleepy .  Follow up with Dr. Halford Chessman or Jurnee Nakayama NP in 3 months and As needed        Rexene Edison, NP 05/24/2022

## 2022-05-24 NOTE — Telephone Encounter (Signed)
[place] at [phone] and reached a recording that it was an [place].  Message sent to [facility] to see if we can get tagged in her airview account.

## 2022-05-24 NOTE — Telephone Encounter (Signed)
Patient to check her bipap machine to see if it has an SD card in it.  If so, she will bring it by the office to have [place] get a download (last used 6-8 months ago).  She also would like to change DME companies to [place] from the mail order company ([employer]).  If she does not have an SD card, we will send an order to [place] to have them mail her one.

## 2022-06-04 NOTE — Telephone Encounter (Signed)
Were we able to get the pt. A new disk or sd card for CPAP plz F/U or Call back

## 2022-06-19 DIAGNOSIS — E119 Type 2 diabetes mellitus without complications: Secondary | ICD-10-CM | POA: Diagnosis not present

## 2022-07-10 DIAGNOSIS — N1832 Chronic kidney disease, stage 3b: Secondary | ICD-10-CM | POA: Diagnosis not present

## 2022-07-14 ENCOUNTER — Telehealth: Payer: Self-pay

## 2022-07-14 NOTE — Progress Notes (Unsigned)
Care Management & Coordination Services Pharmacy Note  07/17/2022 Name:  Paula Stein MRN:  [id number] DOB:  12-10-1945  Summary: BP not at goal <140/90, starts amlodipine 2.5mg  daily tmrw (per pt saw nephrology today and BP was high) LDL not at goal <70  Recommendations/Changes made from today's visit: -START Amlodipine 2.5mg  daily as instructed, counseled on potential side effects -ORDER updated lipid panel at next PCP OV, consider starting zetia if still elevated. -Counseled on cholesterol lowering diet  Follow up plan: One week to assess amlodipine tolerance Pharmacist visit in 3 months   Subjective: Paula Stein is an 77 y.o. year old female who is a primary patient of Deeann Saint, MD.  The care coordination team was consulted for assistance with disease management and care coordination needs.    Engaged with patient face to face for follow up visit.  Recent office visits: 04/21/22 Deeann Saint, MD - Patient presented for essential hypertension and other concerns. Stopped Emollient. Stopped Ergocalciferol. Stopped Xarelto    Recent consult visits: 05/24/22 Parrett, Virgel Bouquet, NP (Pulmonology) - Patient presented for Complex sleep apnea syndrome. No medication changes.   05/16/22 Jaci Standard, MD (Hematology/ Oncology) - Patient presented for Portal vein thrombosis and other concerns. No medication changes Pt discontinued Xarelto per notes, recommended to start baby Asprin daily.  Hospital visits: None in previous 6 months   Objective:  Lab Results  Component Value Date   CREATININE 1.50 (H) 05/16/2022   BUN 24 (H) 05/16/2022   GFR 27.59 (L) 12/21/2021   EGFR 39 (L) 08/19/2021   GFRNONAA 35 (L) 05/16/2022   GFRAA 40 (L) 03/31/2020   NA 142 05/16/2022   K 3.8 05/16/2022   CALCIUM 9.5 05/16/2022   CO2 26 05/16/2022   GLUCOSE 134 (H) 05/16/2022    Lab Results  Component Value Date/Time   HGBA1C 6.9 (A) 04/21/2022 09:33 AM   HGBA1C 7.5 (H) 12/21/2021  11:03 AM   HGBA1C 6.9 (H) 07/21/2021 11:15 AM   GFR 27.59 (L) 12/21/2021 11:03 AM   GFR 29.07 (L) 07/21/2021 11:15 AM   MICROALBUR 168.7 (H) 12/21/2021 11:03 AM   MICROALBUR 33.5 (H) 06/02/2015 10:07 AM    Last diabetic Eye exam:  Lab Results  Component Value Date/Time   HMDIABEYEEXA No Retinopathy 06/09/2020 12:00 AM    Last diabetic Foot exam:  Lab Results  Component Value Date/Time   HMDIABFOOTEX yes 04/20/2010 12:00 AM     Lab Results  Component Value Date   CHOL 236 (H) 12/21/2021   HDL 49.40 12/21/2021   LDLCALC 161 (H) 12/21/2021   LDLDIRECT 195.2 02/15/2011   TRIG 128.0 12/21/2021   CHOLHDL 5 12/21/2021       Latest Ref Rng & Units 05/16/2022    8:27 AM 11/03/2021    9:38 AM 09/27/2021   12:00 AM  Hepatic Function  Total Protein 6.5 - 8.1 g/dL 7.9  8.2    Albumin 3.5 - 5.0 g/dL 3.9  4.1  3.6      AST 15 - 41 U/L 10  10    ALT 0 - 44 U/L 7  7    Alk Phosphatase 38 - 126 U/L 91  93    Total Bilirubin 0.3 - 1.2 mg/dL 0.5  0.5       This result is from an external source.    Lab Results  Component Value Date/Time   TSH 2.71 12/21/2021 11:03 AM   TSH 5.23 (H) 03/31/2020 09:19 AM  FREET4 0.86 03/13/2018 06:35 PM   FREET4 1.16 (H) 05/22/2017 02:25 PM       Latest Ref Rng & Units 05/16/2022    8:27 AM 12/21/2021   11:03 AM 11/03/2021    9:38 AM  CBC  WBC 4.0 - 10.5 K/uL 7.4  7.3  7.8   Hemoglobin 12.0 - 15.0 g/dL 30.1  31.4  38.8   Hematocrit 36.0 - 46.0 % 39.5  37.5  38.0   Platelets 150 - 400 K/uL 256  281.0  251     Lab Results  Component Value Date/Time   VD25OH 7.97 (L) 12/21/2021 11:03 AM   VITAMINB12 381 05/13/2014 02:34 PM    Clinical ASCVD: No  The 10-year ASCVD risk score (Arnett DK, et al., 2019) is: 69.2%   Values used to calculate the score:     Age: 32 years     Sex: Female     Is Non-Hispanic African American: Yes     Diabetic: Yes     Tobacco smoker: Yes     Systolic Blood Pressure: 144 mmHg     Is BP treated: Yes     HDL  Cholesterol: 49.4 mg/dL     Total Cholesterol: 236 mg/dL    DEXA - 8/75/79, showed osteopenia, due for updated scan     04/21/2022    9:41 AM 12/21/2021   10:15 AM 10/21/2021   10:26 AM  Depression screen PHQ 2/9  Decreased Interest 0 0 0  Down, Depressed, Hopeless 0 0 0  PHQ - 2 Score 0 0 0  Altered sleeping 0 1 0  Tired, decreased energy 0 1 0  Change in appetite 0 0 0  Feeling bad or failure about yourself  0 0 0  Trouble concentrating 0 0 0  Moving slowly or fidgety/restless 0 0 0  Suicidal thoughts 0 0 0  PHQ-9 Score 0 2 0  Difficult doing work/chores  Not difficult at all Not difficult at all     Social History   Tobacco Use  Smoking Status Light Smoker   Years: 40   Types: Cigarettes  Smokeless Tobacco Never  Tobacco Comments   Smokes a pack per week.  05/24/2022 hfb   BP Readings from Last 3 Encounters:  05/24/22 (!) 144/80  05/16/22 (!) 187/70  04/21/22 (!) 173/87   Pulse Readings from Last 3 Encounters:  05/24/22 (!) 54  05/16/22 74  04/21/22 70   Wt Readings from Last 3 Encounters:  05/24/22 224 lb (101.6 kg)  05/16/22 224 lb 8 oz (101.8 kg)  04/21/22 229 lb 6.4 oz (104.1 kg)   BMI Readings from Last 3 Encounters:  05/24/22 32.14 kg/m  05/16/22 33.64 kg/m  04/21/22 34.37 kg/m    Allergies  Allergen Reactions   Atorvastatin Other (See Comments)    Cramping   Farxiga [Dapagliflozin] Rash   Ibuprofen Nausea Only   Jardiance [Empagliflozin] Itching    Medications Reviewed Today     Reviewed by Delrae Rend, RN (Registered Nurse) on 05/24/22 at [phone]  Med List Status: <None>   Medication Order Taking? Sig Documenting Provider Last Dose Status Informant  ACCU-CHEK GUIDE test strip [id number] Yes USE TO CHECK BLOOD SUGAR DAILY Deeann Saint, MD Taking Active Self  Accu-Chek Softclix Lancets lancets [id number] Yes USE TO CHECK BLOOD SUGAR DAILY Deeann Saint, MD Taking Active Self  allopurinol (ZYLOPRIM) 100 MG tablet [id number]  Yes Take 1 tablet (100 mg total) by mouth daily. Deeann Saint,  MD Taking Active   Blood Glucose Monitoring Suppl (ACCU-CHEK GUIDE) w/Device KIT [id number] Yes 1 each by Does not apply route daily. Use to check blood sugar daily Deeann Saint, MD Taking Active Self  Calcium Carb-Cholecalciferol (CALCIUM 600+D3 PO) [id number] Yes Take 1 tablet by mouth daily. [provider] Taking Active Self  carvedilol (COREG) 25 MG tablet [id number] Yes Take 1 tablet (25 mg total) by mouth 2 (two) times daily with a meal. Deeann Saint, MD Taking Active   furosemide (LASIX) 40 MG tablet [id number] Yes Take 1 tablet (40 mg total) by mouth 2 (two) times daily. Deeann Saint, MD Taking Active   glipiZIDE (GLUCOTROL XL) 2.5 MG 24 hr tablet [id number] Yes Take 1 tablet (2.5 mg total) by mouth daily with breakfast. Deeann Saint, MD Taking Active   isosorbide mononitrate (IMDUR) 30 MG 24 hr tablet [id number] Yes Take 1 tablet (30 mg total) by mouth daily. Deeann Saint, MD Taking Active   levothyroxine (SYNTHROID) 125 MCG tablet [id number] Yes TAKE 1 TABLET EVERY MORNING ON AN EMPTY STOMACH 30 MINUTES BEFORE TAKING OTHER MEDICATIONS OR BEFORE BREAKFAST Deeann Saint, MD Taking Active   sacubitril-valsartan (ENTRESTO) 97-103 MG [id number] Yes Take 1 tablet by mouth 2 (two) times daily. Duke Salvia, MD Taking Active   spironolactone (ALDACTONE) 50 MG tablet [id number] Yes Take 1 tablet (50 mg total) by mouth daily. Deeann Saint, MD Taking Active   traZODone (DESYREL) 50 MG tablet [id number] Yes Take 0.5-1 tablets (25-50 mg total) by mouth at bedtime as needed for sleep. Deeann Saint, MD Taking Active             SDOH:  (Social Determinants of Health) assessments and interventions performed: Yes SDOH Interventions    Flowsheet Row Care Coordination from 07/17/2022 in [facility] Chronic Care Management from 03/06/2022 in [facility] [place] [facility]  Visit from 12/21/2021 in [facility] [place] Clinical Support from 10/21/2021 in [facility] [place] Chronic Care Management from 07/15/2021 in [facility] [place] Clinical Support from 10/20/2020 in [facility] [place]  SDOH Interventions        Food Insecurity Interventions Intervention Not Indicated -- -- Intervention Not Indicated -- --  Housing Interventions Intervention Not Indicated -- -- Intervention Not Indicated -- Intervention Not Indicated  Transportation Interventions -- -- -- Intervention Not Indicated Intervention Not Indicated Intervention Not Indicated  Depression Interventions/Treatment  -- -- PHQ2-9 Score <4 Follow-up Not Indicated -- -- --  Financial Strain Interventions -- Intervention Not Indicated -- Intervention Not Indicated Intervention Not Indicated --  Physical Activity Interventions -- -- -- Intervention Not Indicated -- Intervention Not Indicated  Stress Interventions -- -- -- Intervention Not Indicated -- --  Social Connections Interventions -- -- -- Intervention Not Indicated -- Intervention Not Indicated       Medication Assistance:  None  Medication Access: Within the past 30 days, how often has patient missed a dose of medication? None Is a pillbox or other method used to improve adherence? Yes  Factors that may affect medication adherence? no barriers identified Are meds synced by current pharmacy? No  Are meds delivered by current pharmacy? No  Does patient experience delays in picking up medications due to transportation concerns? No   [facility] Reviewed: Is patient disadvantaged to use [facility]?: No  Current Rx insurance plan: [employer] Name and location of Current pharmacy:  [facility] [phone] - [place], [place] - [address] AT [facility] [address] [place] [address] Phone: [phone]  Fax: [phone]  [facility] services reviewed with patient today?: No  Patient requests to transfer care to [facility]?: No  Reason patient declined to change pharmacies: Not mentioned at this visit  Compliance/Adherence/Medication fill history: Care Gaps: Eye Exam - Overdue COVID Booster - Overdue    Star-Rating Drugs: Glipizide 2.5 mg - last filled 07/10/22 90 DS at [facility]   Assessment/Plan   Hyperlipidemia: (LDL goal < 70) -Uncontrolled -Current treatment: None -Medications previously tried: Atorvastatin, Rosuvastatin -Current dietary patterns: not discussed -Current exercise habits: not discussed -Educated on Cholesterol goals;  Benefits of statin for ASCVD risk reduction; Importance of limiting foods high in cholesterol; Exercise goal of 150 minutes per week; -Recommended updated lipid panel at next PCP OV, patient does not want to try another statin due to tolerance issues in the past. Started new BP med today so does want to hold off trying zetia until lipid panel attained.  Heart Failure/Hypertension(Goal: BP <140/90; manage symptoms and prevent exacerbations) -Not ideally controlled -Last ejection fraction: 30-35% (Date: 07/07/21) -HF type: HFrEF (EF < 40%) -NYHA Class: III (marked limitation of activity) -AHA HF Stage: C (Heart disease and symptoms present) -Current treatment: Carvedilol 25mg  BID Appropriate, Effective, Safe, Accessible Lasix 40mg  BID Appropriate, Effective, Safe, Accessible Entresto 97-103mg   BID Appropriate, Effective, Safe, Accessible Spironolactone 50mg  1 qd Appropriate, Effective, Safe, Accessible Amlodipine 2.5mg  1 tab qd Appropriate, Query Effective -Medications previously tried: Valsartan, Losartan, Farxiga/Jardiance -Current home BP/HR readings: no machine at home to check currently -Current home daily weights: Not discussed -Current dietary habits: mindful of salt intake -Current exercise habits: not  discussed -Educated on Benefits of medications for managing symptoms and prolonging life Proper diuretic administration and potassium supplementation Importance of blood pressure control -Recommended to continue current medication Counseled on amlodipine side effects  Sherrill Raring Clinical Pharmacist [phone]

## 2022-07-14 NOTE — Progress Notes (Signed)
Patient ID: Paula Stein, female   DOB: 10-Aug-1945, 77 y.o.   MRN: [id number]  Care Management & Coordination Services Pharmacy Team  Reason for Encounter: Appointment Reminder  Contacted patient to confirm telephone appointment with Milas Kocher, PharmD on 07/17/22 at 2. Unsuccessful outreach. Left voicemail for patient to return call.    Star Rating Drugs:  Glipizide 2.5 mg - last filled 07/10/22 90 DS at [facility]    Care Gaps: Eye Exam - Overdue COVID Booster - Overdue      Pamala Duffel [facility] Clinical Pharmacist Assistant [phone]

## 2022-07-17 ENCOUNTER — Ambulatory Visit: Payer: Medicare HMO

## 2022-07-17 DIAGNOSIS — R809 Proteinuria, unspecified: Secondary | ICD-10-CM | POA: Diagnosis not present

## 2022-07-17 DIAGNOSIS — E1122 Type 2 diabetes mellitus with diabetic chronic kidney disease: Secondary | ICD-10-CM | POA: Diagnosis not present

## 2022-07-17 DIAGNOSIS — N1832 Chronic kidney disease, stage 3b: Secondary | ICD-10-CM | POA: Diagnosis not present

## 2022-07-17 DIAGNOSIS — I129 Hypertensive chronic kidney disease with stage 1 through stage 4 chronic kidney disease, or unspecified chronic kidney disease: Secondary | ICD-10-CM | POA: Diagnosis not present

## 2022-07-20 ENCOUNTER — Telehealth: Payer: Self-pay

## 2022-07-20 NOTE — Telephone Encounter (Signed)
-----   Message from Sherrill Raring, [facility] sent at 07/17/2022  3:49 PM EDT ----- Regarding: Med Refill Pt called requesting a refill on the following medication:  Hydroxyzine Pamoate capsules 25mg  1 every 6 hours prn itching  Preferred Pharmacy: [facility] [phone] - [place], [place] - [address] AT [facility] [address]  Phone: [phone] Fax: [phone]   Thank you! Sherrill Raring Clinical Pharmacist [phone]

## 2022-07-20 NOTE — Telephone Encounter (Signed)
Patient was taking hydroxyzine for itching when she was on empagliflozin.  Patient no longer on empagliflozin.  Refill not appropriate.

## 2022-07-21 NOTE — Telephone Encounter (Signed)
Patient informed of the message below and voiced understanding

## 2022-07-24 ENCOUNTER — Telehealth: Payer: Self-pay

## 2022-07-24 DIAGNOSIS — I129 Hypertensive chronic kidney disease with stage 1 through stage 4 chronic kidney disease, or unspecified chronic kidney disease: Secondary | ICD-10-CM | POA: Diagnosis not present

## 2022-07-24 NOTE — Progress Notes (Signed)
Care Management & Coordination Services Pharmacy Team  Reason for Encounter: General adherence update / Amlodipine Tolerance    {US HC Outreach:28874}  Call to patient Per Milas Kocher. to see how se has been tolerating the Amlodipine since starting it. Did not reach her left a detailed message with my contact information for a return call.    Chart Updates:  Recent office visits:  None  Recent consult visits:  None   Hospital visits:  None in previous 6 months  Medications: Outpatient Encounter Medications as of 07/24/2022  Medication Sig   ACCU-CHEK GUIDE test strip USE TO CHECK BLOOD SUGAR DAILY   Accu-Chek Softclix Lancets lancets USE TO CHECK BLOOD SUGAR DAILY   allopurinol (ZYLOPRIM) 100 MG tablet Take 1 tablet (100 mg total) by mouth daily.   amLODipine (NORVASC) 2.5 MG tablet Take 2.5 mg by mouth daily.   Blood Glucose Monitoring Suppl (ACCU-CHEK GUIDE) w/Device KIT 1 each by Does not apply route daily. Use to check blood sugar daily   Calcium Carb-Cholecalciferol (CALCIUM 600+D3 PO) Take 1 tablet by mouth daily.   carvedilol (COREG) 25 MG tablet Take 1 tablet (25 mg total) by mouth 2 (two) times daily with a meal.   furosemide (LASIX) 40 MG tablet Take 1 tablet (40 mg total) by mouth 2 (two) times daily.   glipiZIDE (GLUCOTROL XL) 2.5 MG 24 hr tablet Take 1 tablet (2.5 mg total) by mouth daily with breakfast.   isosorbide mononitrate (IMDUR) 30 MG 24 hr tablet Take 1 tablet (30 mg total) by mouth daily.   levothyroxine (SYNTHROID) 125 MCG tablet TAKE 1 TABLET EVERY MORNING ON AN EMPTY STOMACH 30 MINUTES BEFORE TAKING OTHER MEDICATIONS OR BEFORE BREAKFAST   sacubitril-valsartan (ENTRESTO) 97-103 MG Take 1 tablet by mouth 2 (two) times daily.   spironolactone (ALDACTONE) 50 MG tablet Take 1 tablet (50 mg total) by mouth daily.   traZODone (DESYREL) 50 MG tablet Take 0.5-1 tablets (25-50 mg total) by mouth at bedtime as needed for sleep.   No facility-administered  encounter medications on file as of 07/24/2022.    Recent vitals BP Readings from Last 3 Encounters:  05/24/22 (!) 144/80  05/16/22 (!) 187/70  04/21/22 (!) 173/87   Pulse Readings from Last 3 Encounters:  05/24/22 (!) 54  05/16/22 74  04/21/22 70   Wt Readings from Last 3 Encounters:  05/24/22 224 lb (101.6 kg)  05/16/22 224 lb 8 oz (101.8 kg)  04/21/22 229 lb 6.4 oz (104.1 kg)   BMI Readings from Last 3 Encounters:  05/24/22 32.14 kg/m  05/16/22 33.64 kg/m  04/21/22 34.37 kg/m    Recent lab results    Component Value Date/Time   NA 142 05/16/2022 0827   NA 141 09/27/2021 0000   K 3.8 05/16/2022 0827   CL 108 05/16/2022 0827   CO2 26 05/16/2022 0827   GLUCOSE 134 (H) 05/16/2022 0827   BUN 24 (H) 05/16/2022 0827   BUN 20 09/27/2021 0000   CREATININE 1.50 (H) 05/16/2022 0827   CREATININE 1.46 (H) 03/31/2020 0919   CALCIUM 9.5 05/16/2022 0827    Lab Results  Component Value Date   CREATININE 1.50 (H) 05/16/2022   GFR 27.59 (L) 12/21/2021   EGFR 39 (L) 08/19/2021   GFRNONAA 35 (L) 05/16/2022   GFRAA 40 (L) 03/31/2020   Lab Results  Component Value Date/Time   HGBA1C 6.9 (A) 04/21/2022 09:33 AM   HGBA1C 7.5 (H) 12/21/2021 11:03 AM   HGBA1C 6.9 (H) 07/21/2021 11:15  AM   MICROALBUR 168.7 (H) 12/21/2021 11:03 AM   MICROALBUR 33.5 (H) 06/02/2015 10:07 AM    Lab Results  Component Value Date   CHOL 236 (H) 12/21/2021   HDL 49.40 12/21/2021   LDLCALC 161 (H) 12/21/2021   LDLDIRECT 195.2 02/15/2011   TRIG 128.0 12/21/2021   CHOLHDL 5 12/21/2021    Care Gaps: Eye Exam - Overdue COVID Booster - Overdue AWV - 10/21/21  Star Rating Drugs:  Glipizide 2.5 mg - last filled 07/10/22 90 DS at [facility]   Pamala Duffel CMA Clinical Pharmacist Assistant [phone]

## 2022-07-28 ENCOUNTER — Ambulatory Visit (INDEPENDENT_AMBULATORY_CARE_PROVIDER_SITE_OTHER): Payer: Medicare HMO

## 2022-07-28 DIAGNOSIS — I428 Other cardiomyopathies: Secondary | ICD-10-CM

## 2022-08-01 LAB — CUP PACEART REMOTE DEVICE CHECK
Battery Remaining Percentage: 71 %
Date Time Interrogation Session: 20240430130500
Implantable Lead Connection Status: 753985
Implantable Lead Implant Date: 20160707
Implantable Lead Location: 753862
Implantable Lead Model: 3401
Implantable Pulse Generator Implant Date: 20210813
Pulse Gen Serial Number: 143555

## 2022-08-02 NOTE — Progress Notes (Unsigned)
  Electrophysiology Office Note:   Date:  08/03/2022  ID:  LIYAT FAULKENBERRY, DOB 07-31-1945, MRN [id number]  Primary Cardiologist: Tonny Bollman, MD Electrophysiologist: Sherryl Manges, MD   History of Present Illness:   Paula Stein is a 77 y.o. 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   Since last being seen in our clinic the patient reports doing very well.  she denies chest pain, palpitations, dyspnea, PND, orthopnea, nausea, vomiting, dizziness, syncope, edema, weight gain, or early satiety.   Review of systems complete and found to be negative unless listed in HPI.   Device History: Environmental manager S-ICD ICD implanted 10/2014, gen change 11/2019 for CHF  Studies Reviewed:    ICD Interrogation-  reviewed in detail today,  See PACEART report.  EKG is ordered today. Personal review shows NSR at 75 bpm with 1st degree AV block   Physical Exam:   VS:  BP (!) 152/74   Pulse 75   Ht 5\' 10"  (1.778 m)   Wt 219 lb (99.3 kg)   SpO2 96%   BMI 31.42 kg/m    Wt Readings from Last 3 Encounters:  08/03/22 219 lb (99.3 kg)  05/24/22 224 lb (101.6 kg)  05/16/22 224 lb 8 oz (101.8 kg)     GEN: Well nourished, well developed in no acute distress NECK: No JVD; No carotid bruits CARDIAC: Regular rate and rhythm, no murmurs, rubs, gallops RESPIRATORY:  Clear to auscultation without rales, wheezing or rhonchi  ABDOMEN: Soft, non-tender, non-distended EXTREMITIES:  No edema; No deformity   ASSESSMENT AND PLAN:    Chronic systolic (Nonischemic) cardiomyopathy ICD-Subcutaneous s/p GEN change secondary to device failure Echo 07/2021 LVEF 30-35%, mild LVH, Grade 1 DD,  Mod RV reduction, trivial MR. Left ventricular hypertrophy , mild on most recent echo Euvolemic today Stable on an appropriate medical regimen Normal ICD function See Pace Art report No changes today   Obesity Body mass index is 31.42 kg/m.  Encouraged lifestyle modification    HTN Stable on current regimen    Portal Vein thrombosis On Xarelto chronically  Disposition:   Follow up with Dr. Graciela Husbands in 12 months   Signed, Graciella Freer, PA-C

## 2022-08-03 ENCOUNTER — Encounter: Payer: Self-pay | Admitting: Student

## 2022-08-03 ENCOUNTER — Ambulatory Visit: Payer: Medicare HMO | Attending: Student | Admitting: Student

## 2022-08-03 VITALS — BP 152/74 | HR 75 | Ht 70.0 in | Wt 219.0 lb

## 2022-08-03 DIAGNOSIS — I428 Other cardiomyopathies: Secondary | ICD-10-CM | POA: Diagnosis not present

## 2022-08-03 DIAGNOSIS — I10 Essential (primary) hypertension: Secondary | ICD-10-CM

## 2022-08-03 DIAGNOSIS — I81 Portal vein thrombosis: Secondary | ICD-10-CM | POA: Diagnosis not present

## 2022-08-03 DIAGNOSIS — I5020 Unspecified systolic (congestive) heart failure: Secondary | ICD-10-CM | POA: Diagnosis not present

## 2022-08-03 NOTE — Patient Instructions (Signed)
Medication Instructions:  Your physician recommends that you continue on your current medications as directed. Please refer to the Current Medication list given to you today.  *If you need a refill on your cardiac medications before your next appointment, please call your pharmacy*  Lab Work: None ordered If you have labs (blood work) drawn today and your tests are completely normal, you will receive your results only by: MyChart Message (if you have MyChart) OR A paper copy in the mail If you have any lab test that is abnormal or we need to change your treatment, we will call you to review the results.  Follow-Up: At [facility], you and your health needs are our priority.  As part of our continuing mission to provide you with exceptional heart care, we have created designated Provider Care Teams.  These Care Teams include your primary Cardiologist (physician) and Advanced Practice Providers (APPs -  Physician Assistants and Nurse Practitioners) who all work together to provide you with the care you need, when you need it.  Your next appointment:   1 year(s)  Provider:   Sherryl Manges, MD

## 2022-08-11 DIAGNOSIS — I10 Essential (primary) hypertension: Secondary | ICD-10-CM | POA: Diagnosis not present

## 2022-08-11 DIAGNOSIS — M791 Myalgia, unspecified site: Secondary | ICD-10-CM | POA: Diagnosis not present

## 2022-08-22 ENCOUNTER — Ambulatory Visit: Payer: Medicare HMO | Admitting: Adult Health

## 2022-08-22 NOTE — Progress Notes (Signed)
Remote ICD transmission.   

## 2022-08-29 ENCOUNTER — Other Ambulatory Visit: Payer: Self-pay | Admitting: Family Medicine

## 2022-08-30 MED ORDER — ACCU-CHEK GUIDE W/DEVICE KIT: 1.0000 | PACK | Freq: Every day | 0 refills | Status: DC

## 2022-09-06 ENCOUNTER — Telehealth: Payer: Self-pay | Admitting: Family Medicine

## 2022-09-06 ENCOUNTER — Other Ambulatory Visit: Payer: Self-pay

## 2022-09-06 MED ORDER — ACCU-CHEK GUIDE VI STRP: ORAL_STRIP | 2 refills | Status: DC

## 2022-09-06 NOTE — Telephone Encounter (Signed)
Prescription Request  09/06/2022  LOV: 04/21/2022  What is the name of the medication or equipment? ACCU-CHEK GUIDE test strip   Have you contacted your pharmacy to request a refill? No   Which pharmacy would you like this sent to?  [facility] [phone] - [place], [place] - [address] AT [facility] [address] [place] [address] Phone: [phone] Fax: [phone]    Patient notified that their request is being sent to the clinical staff for review and that they should receive a response within 2 business days.   Please advise at Mobile [phone] (mobile)

## 2022-10-27 ENCOUNTER — Ambulatory Visit (INDEPENDENT_AMBULATORY_CARE_PROVIDER_SITE_OTHER): Payer: Medicare HMO

## 2022-10-27 DIAGNOSIS — I428 Other cardiomyopathies: Secondary | ICD-10-CM | POA: Diagnosis not present

## 2022-10-30 LAB — CUP PACEART REMOTE DEVICE CHECK
Battery Remaining Percentage: 68 %
Date Time Interrogation Session: 20240729145500
Implantable Lead Connection Status: 753985
Implantable Lead Implant Date: 20160707
Implantable Lead Location: 753862
Implantable Lead Model: 3401
Implantable Pulse Generator Implant Date: 20210813
Pulse Gen Serial Number: 143555

## 2022-11-03 ENCOUNTER — Other Ambulatory Visit: Payer: Self-pay | Admitting: Family Medicine

## 2022-11-03 NOTE — Progress Notes (Signed)
Remote ICD transmission.   

## 2022-12-20 ENCOUNTER — Ambulatory Visit: Payer: Medicare HMO | Admitting: Cardiovascular Disease

## 2022-12-29 ENCOUNTER — Other Ambulatory Visit: Payer: Medicare HMO

## 2023-01-02 DIAGNOSIS — E1122 Type 2 diabetes mellitus with diabetic chronic kidney disease: Secondary | ICD-10-CM | POA: Diagnosis not present

## 2023-01-02 DIAGNOSIS — N1832 Chronic kidney disease, stage 3b: Secondary | ICD-10-CM | POA: Diagnosis not present

## 2023-01-02 DIAGNOSIS — I129 Hypertensive chronic kidney disease with stage 1 through stage 4 chronic kidney disease, or unspecified chronic kidney disease: Secondary | ICD-10-CM | POA: Diagnosis not present

## 2023-01-02 DIAGNOSIS — R809 Proteinuria, unspecified: Secondary | ICD-10-CM | POA: Diagnosis not present

## 2023-01-26 ENCOUNTER — Ambulatory Visit (INDEPENDENT_AMBULATORY_CARE_PROVIDER_SITE_OTHER): Payer: Medicare HMO

## 2023-01-26 DIAGNOSIS — I428 Other cardiomyopathies: Secondary | ICD-10-CM

## 2023-01-26 LAB — CUP PACEART REMOTE DEVICE CHECK
Battery Remaining Percentage: 66 %
Date Time Interrogation Session: 20241023151500
HighPow Impedance: 70 Ohm
Implantable Lead Connection Status: 753985
Implantable Lead Implant Date: 20160707
Implantable Lead Location: 753862
Implantable Lead Model: 3401
Implantable Pulse Generator Implant Date: 20210813
Pulse Gen Serial Number: 143555

## 2023-02-05 ENCOUNTER — Ambulatory Visit (INDEPENDENT_AMBULATORY_CARE_PROVIDER_SITE_OTHER): Payer: Medicare HMO

## 2023-02-05 ENCOUNTER — Ambulatory Visit: Payer: Medicare HMO | Admitting: Family Medicine

## 2023-02-05 ENCOUNTER — Encounter: Payer: Self-pay | Admitting: Family Medicine

## 2023-02-05 VITALS — BP 142/80 | HR 72 | Temp 98.9°F | Ht 70.0 in | Wt 232.8 lb

## 2023-02-05 DIAGNOSIS — I517 Cardiomegaly: Secondary | ICD-10-CM | POA: Diagnosis not present

## 2023-02-05 DIAGNOSIS — G4700 Insomnia, unspecified: Secondary | ICD-10-CM

## 2023-02-05 DIAGNOSIS — R059 Cough, unspecified: Secondary | ICD-10-CM

## 2023-02-05 DIAGNOSIS — Z720 Tobacco use: Secondary | ICD-10-CM | POA: Diagnosis not present

## 2023-02-05 DIAGNOSIS — I5020 Unspecified systolic (congestive) heart failure: Secondary | ICD-10-CM

## 2023-02-05 DIAGNOSIS — N1832 Chronic kidney disease, stage 3b: Secondary | ICD-10-CM

## 2023-02-05 DIAGNOSIS — R0602 Shortness of breath: Secondary | ICD-10-CM

## 2023-02-05 DIAGNOSIS — Z7984 Long term (current) use of oral hypoglycemic drugs: Secondary | ICD-10-CM

## 2023-02-05 DIAGNOSIS — E1142 Type 2 diabetes mellitus with diabetic polyneuropathy: Secondary | ICD-10-CM

## 2023-02-05 LAB — POC COVID19 BINAXNOW: SARS Coronavirus 2 Ag: NEGATIVE

## 2023-02-05 MED ORDER — BENZONATATE 100 MG PO CAPS: 100.0000 mg | ORAL_CAPSULE | Freq: Two times a day (BID) | ORAL | 0 refills | Status: DC | PRN

## 2023-02-05 MED ORDER — PREDNISONE 20 MG PO TABS: 40.0000 mg | ORAL_TABLET | Freq: Every day | ORAL | 0 refills | Status: AC

## 2023-02-05 MED ORDER — IPRATROPIUM-ALBUTEROL 0.5-2.5 (3) MG/3ML IN SOLN
3.0000 mL | Freq: Once | RESPIRATORY_TRACT | Status: AC
  Administered 2023-02-05: 3 mL via RESPIRATORY_TRACT

## 2023-02-05 MED ORDER — TRAZODONE HCL 50 MG PO TABS
25.0000 mg | ORAL_TABLET | Freq: Every evening | ORAL | 1 refills | Status: DC | PRN
Start: 2023-02-05 — End: 2023-12-27

## 2023-02-05 MED ORDER — ALBUTEROL SULFATE HFA 108 (90 BASE) MCG/ACT IN AERS: 2.0000 | INHALATION_SPRAY | Freq: Four times a day (QID) | RESPIRATORY_TRACT | 0 refills | Status: DC | PRN

## 2023-02-05 NOTE — Progress Notes (Signed)
Established Patient Office Visit   Subjective  Patient ID: Paula Stein, female    DOB: 07/09/1945  Age: 77 y.o. MRN: [id number]  Chief Complaint  Patient presents with   Cough    Started 3 days ago, cough, congestion, wheezing, hard to breath    Pt I s a 77 yo female with pmh sig for HFrEF HTN, NICM, defibrillator in place, CAD, DM2 seen for acute concern. Pt with SOB, cough, congestion, wheezing x 3 days.  Symptoms initially started with sneezing then progressed.  Patient states shortness of breath was so bad she had to sleep sitting up in wheelchair.  Patient denies fever, chills, diarrhea, nausea, vomiting.  Cough   Patient Active Problem List   Diagnosis Date Noted   History of colonic polyps    Cecal polyp    Stage 3b chronic kidney disease (CKD) (HCC) 07/07/2021   DM2 (diabetes mellitus, type 2) (HCC) 07/07/2021   Acute on chronic systolic (congestive) heart failure (HCC) 07/06/2021   Diverticulosis of colon without hemorrhage    Multiple polyps of sigmoid colon    Polyp of ascending colon    Polyp of descending colon    Rectal polyp    Lower abdominal pain    Coagulopathy (HCC) 04/13/2020   Sigmoid diverticulitis 03/15/2020   Portal vein thrombosis 03/14/2020   HFrEF (heart failure with reduced ejection fraction) (HCC) 03/13/2018   Non-ischemic cardiomyopathy (HCC)    Hypertension    Diabetes mellitus without complication (HCC)    CAD (coronary artery disease) 06/18/2017   PAD (peripheral artery disease) (HCC) 06/18/2017   ICD (implantable cardioverter-defibrillator) in place 06/18/2017   Chronic combined systolic and diastolic CHF (congestive heart failure) (HCC) 05/22/2017   Hyperlipidemia associated with type 2 diabetes mellitus (HCC) 12/11/2016   Treatment-emergent central sleep apnea 09/20/2016   NICM (nonischemic cardiomyopathy) (HCC) 10/08/2014   AICD (automatic cardioverter/defibrillator) present 10/08/2014   Cigarette smoker 10/30/2012   Complex sleep  apnea syndrome 09/10/2012   De Quervain's disease (tenosynovitis) 09/01/2010   Atherosclerosis 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 (HCC) 12/07/2008   Diabetes (HCC) 10/29/2008   Hypothyroidism 01/31/2007   Essential hypertension 01/31/2007   GERD 01/31/2007   Past Medical History:  Diagnosis Date   AICD (automatic cardioverter/defibrillator) present 10/08/2014   SUBQ    /    DR Graciela Husbands   Cataract    CHF (congestive heart failure) (HCC)    Diabetes mellitus without complication (HCC)    GERD (gastroesophageal reflux disease)    History of cardiac cath 05/05/2007   normal-with patent coronaries   History of colonoscopy    History of hiatal hernia    Hyperlipidemia    Hypertension    Kidney failure    Stage 3   Non-ischemic cardiomyopathy (HCC)    EF 28%- reassessment of LV function 2011 with LVEf 45-50%   PAD (peripheral artery disease) (HCC)    lower extremities with ABIs of 0.5 bilaterally   Personal history of goiter    S/P radioactive iodine thyroid ablation    Sleep apnea    uses CPAP   Past Surgical History:  Procedure Laterality Date   BIOPSY  10/13/2020   Procedure: BIOPSY;  Surgeon: Shellia Cleverly, DO;  Location: [facility];  Service: Gastroenterology;;   BIOPSY  11/10/2021   Procedure: BIOPSY;  Surgeon: Shellia Cleverly, DO;  Location: [facility];  Service: Gastroenterology;;   CATARACT EXTRACTION Bilateral    COLONOSCOPY WITH PROPOFOL N/A 10/13/2020  Procedure: COLONOSCOPY WITH PROPOFOL;  Surgeon: Shellia Cleverly, DO;  Location: [facility];  Service: Gastroenterology;  Laterality: N/A;   COLONOSCOPY WITH PROPOFOL N/A 11/10/2021   Procedure: COLONOSCOPY WITH PROPOFOL;  Surgeon: Shellia Cleverly, DO;  Location: [facility];  Service: Gastroenterology;  Laterality: N/A;   EP IMPLANTABLE DEVICE N/A 10/08/2014   Procedure: SubQ ICD Implant;  Surgeon: Duke Salvia, MD;  Location: [facility] CV [facility];  Service: Cardiovascular;   Laterality: N/A;   POLYPECTOMY  10/13/2020   Procedure: POLYPECTOMY;  Surgeon: Shellia Cleverly, DO;  Location: [facility];  Service: Gastroenterology;;   Leontine Locket ICD CHANGEOUT N/A 11/14/2019   Procedure: ZOXW ICD CHANGEOUT;  Surgeon: Duke Salvia, MD;  Location: [facility] INVASIVE CV LAB;  Service: Cardiovascular;  Laterality: N/A;   THYROIDECTOMY     TONSILLECTOMY     TOTAL ABDOMINAL HYSTERECTOMY     Social History   Tobacco Use   Smoking status: Light Smoker    Types: Cigarettes   Smokeless tobacco: Never   Tobacco comments:    Smokes a pack per week.  05/24/2022 hfb  Vaping Use   Vaping status: Never Used  Substance Use Topics   Alcohol use: Yes    Alcohol/week: 0.0 standard drinks of alcohol    Comment: rare glass of wine   Drug use: No   Family History  Problem Relation Age of Onset   Diabetes type II Mother    Hypertension Mother    Diabetes Mother    Heart disease Mother    Other Father        deceased from accident age 1   Hyperlipidemia Father    Breast cancer Neg Hx    Stomach cancer Neg Hx    Colon cancer Neg Hx    Esophageal cancer Neg Hx    Pancreatic cancer Neg Hx    Allergies  Allergen Reactions   Atorvastatin Other (See Comments)    Cramping   Farxiga [Dapagliflozin] Rash   Ibuprofen Nausea Only   Jardiance [Empagliflozin] Itching      Review of Systems  Respiratory:  Positive for cough.    Negative unless stated above    Objective:     BP (!) 142/80 (BP Location: Left Arm, Patient Position: Sitting, Cuff Size: Normal) Comment: patient is coughing  Pulse 72   Temp 98.9 F (37.2 C) (Oral)   Ht 5\' 10"  (1.778 m)   Wt 232 lb 12.8 oz (105.6 kg)   SpO2 97%   BMI 33.40 kg/m  BP Readings from Last 3 Encounters:  02/05/23 (!) 142/80  08/03/22 (!) 152/74  05/24/22 (!) 144/80   Wt Readings from Last 3 Encounters:  02/05/23 232 lb 12.8 oz (105.6 kg)  08/03/22 219 lb (99.3 kg)  05/24/22 224 lb (101.6 kg)      Physical  Exam Constitutional:      General: She is not in acute distress.    Appearance: Normal appearance.  HENT:     Head: Normocephalic and atraumatic.     Nose: Nose normal.     Mouth/Throat:     Mouth: Mucous membranes are moist.  Cardiovascular:     Rate and Rhythm: Normal rate and regular rhythm.     Heart sounds: Normal heart sounds. No murmur heard.    No gallop.  Pulmonary:     Effort: Tachypnea and retractions present. No respiratory distress.     Breath sounds: Rhonchi present. No wheezing or rales.     Comments: Cough.  Increased WOB. Skin:    General: Skin is warm and dry.  Neurological:     Mental Status: She is alert and oriented to person, place, and time.      Results for orders placed or performed in visit on 02/05/23  POC COVID-19  Result Value Ref Range   SARS Coronavirus 2 Ag Negative Negative      Assessment & Plan:  Cough, unspecified type -Concern for viral URI versus pneumonia.  Also consider COPD exacerbation given history of smoking. -     POC COVID-19 BinaxNow -     DG Chest 2 View -     CBC with Differential/Platelet -     Benzonatate; Take 1 capsule (100 mg total) by mouth 2 (two) times daily as needed for cough.  Dispense: 20 capsule; Refill: 0 -     predniSONE; Take 2 tablets (40 mg total) by mouth daily with breakfast for 3 days.  Dispense: 6 tablet; Refill: 0  Insomnia, unspecified type -     traZODone HCl; Take 0.5-1 tablets (25-50 mg total) by mouth at bedtime as needed for sleep.  Dispense: 30 tablet; Refill: 1  SOB (shortness of breath) -     CBC with Differential/Platelet -     Brain natriuretic peptide -     Basic metabolic panel -     Albuterol Sulfate HFA; Inhale 2 puffs into the lungs every 6 (six) hours as needed for wheezing or shortness of breath.  Dispense: 8 g; Refill: 0 -     predniSONE; Take 2 tablets (40 mg total) by mouth daily with breakfast for 3 days.  Dispense: 6 tablet; Refill: 0  HFrEF (heart failure with reduced  ejection fraction) (HCC) -     Brain natriuretic peptide  Type 2 diabetes mellitus with diabetic polyneuropathy, without long-term current use of insulin (HCC) -taking ozempic 0.25 mg as started by nephro.  Concern for hypoglycemia as med suppressing appetite. -     Hemoglobin A1c  Nicotine use -smoking 1 pk q 2 wks. -discussed quitting as not able to smoke due to current illness. -Offered quit aids  Stage 3b chronic kidney disease (CKD) (HCC) -continue f/u with nephro  Acute URI symptoms, likely viral in etiology.  Concern for bronchitis vs COPD exacerbation given smoking hx.  Also consider CHF exacerbation.  COVID testing negative in clinic.  Obtain CXR.  Duoneb given in clinic with improvement in breathing.  Tessalon, albuterol inhaler, pred burst.  Advised fsbs will likely be elevated due to the steroids.    Return if symptoms worsen or fail to improve.   Deeann Saint, MD

## 2023-02-06 ENCOUNTER — Other Ambulatory Visit: Payer: Self-pay

## 2023-02-06 LAB — CBC WITH DIFFERENTIAL/PLATELET
Basophils Absolute: 0.1 10*3/uL (ref 0.0–0.1)
Basophils Relative: 1.1 % (ref 0.0–3.0)
Eosinophils Absolute: 0.2 10*3/uL (ref 0.0–0.7)
Eosinophils Relative: 1.9 % (ref 0.0–5.0)
HCT: 37.7 % (ref 36.0–46.0)
Hemoglobin: 11.9 g/dL — ABNORMAL LOW (ref 12.0–15.0)
Lymphocytes Relative: 25.4 % (ref 12.0–46.0)
Lymphs Abs: 2.2 10*3/uL (ref 0.7–4.0)
MCHC: 31.6 g/dL (ref 30.0–36.0)
MCV: 99 fL (ref 78.0–100.0)
Monocytes Absolute: 0.6 10*3/uL (ref 0.1–1.0)
Monocytes Relative: 7.3 % (ref 3.0–12.0)
Neutro Abs: 5.5 10*3/uL (ref 1.4–7.7)
Neutrophils Relative %: 64.3 % (ref 43.0–77.0)
Platelets: 280 10*3/uL (ref 150.0–400.0)
RBC: 3.81 Mil/uL — ABNORMAL LOW (ref 3.87–5.11)
RDW: 14.8 % (ref 11.5–15.5)
WBC: 8.5 10*3/uL (ref 4.0–10.5)

## 2023-02-06 LAB — BASIC METABOLIC PANEL
BUN: 19 mg/dL (ref 6–23)
CO2: 28 meq/L (ref 19–32)
Calcium: 9.3 mg/dL (ref 8.4–10.5)
Chloride: 105 meq/L (ref 96–112)
Creatinine, Ser: 1.71 mg/dL — ABNORMAL HIGH (ref 0.40–1.20)
GFR: 28.15 mL/min — ABNORMAL LOW (ref >60.00)
Glucose, Bld: 97 mg/dL (ref 70–99)
Potassium: 3.9 meq/L (ref 3.5–5.1)
Sodium: 139 meq/L (ref 135–145)

## 2023-02-06 LAB — HEMOGLOBIN A1C: Hgb A1c MFr Bld: 6.9 % — ABNORMAL HIGH (ref 4.6–6.5)

## 2023-02-06 LAB — BRAIN NATRIURETIC PEPTIDE: Pro B Natriuretic peptide (BNP): 95 pg/mL (ref 0.0–100.0)

## 2023-02-09 NOTE — Addendum Note (Signed)
Addended by: Abbe Amsterdam R on: 02/09/2023 01:08 PM   Modules accepted: Level of Service

## 2023-02-12 ENCOUNTER — Ambulatory Visit (INDEPENDENT_AMBULATORY_CARE_PROVIDER_SITE_OTHER): Payer: Medicare HMO

## 2023-02-12 ENCOUNTER — Telehealth: Payer: Self-pay | Admitting: Family Medicine

## 2023-02-12 ENCOUNTER — Other Ambulatory Visit: Payer: Self-pay | Admitting: Family Medicine

## 2023-02-12 DIAGNOSIS — Z0000 Encounter for general adult medical examination without abnormal findings: Secondary | ICD-10-CM

## 2023-02-12 DIAGNOSIS — R059 Cough, unspecified: Secondary | ICD-10-CM

## 2023-02-12 NOTE — Progress Notes (Signed)
Remote ICD transmission.   

## 2023-02-12 NOTE — Progress Notes (Signed)
Subjective:   Paula Stein is a 77 y.o. female who presents for Medicare Annual (Subsequent) preventive examination.  Visit Complete: Virtual I connected with  Paula Stein on 02/12/23 by a audio enabled telemedicine application and verified that I am speaking with the correct person using two identifiers.  Patient Location: Home  Provider Location: Office/Clinic  I discussed the limitations of evaluation and management by telemedicine. The patient expressed understanding and agreed to proceed.  Vital Signs: Because this visit was a virtual/telehealth visit, some criteria may be missing or patient reported. Any vitals not documented were not able to be obtained and vitals that have been documented are patient reported.  Patient Medicare AWV questionnaire was completed by the patient on 02/12/2023; I have confirmed that all information answered by patient is correct and no changes since this date.  Cardiac Risk Factors include: advanced age (>46men, >59 women);diabetes mellitus;dyslipidemia;hypertension     Objective:    Today's Vitals   02/12/23 0926  PainSc: 8    There is no height or weight on file to calculate BMI.     02/12/2023    9:34 AM 10/21/2021   10:33 AM 07/06/2021    7:45 PM 01/17/2021   10:38 PM 10/20/2020    2:41 PM 10/13/2020    9:26 AM 03/14/2020    5:00 PM  Advanced Directives  Does Patient Have a Medical Advance Directive? Yes Yes No No Yes Yes No  Type of Estate agent of [place];Living will Living will;Out of facility DNR (pink MOST or yellow form)   Healthcare Power of [place];Living will Living will   Does patient want to make changes to medical advance directive?  No - Patient declined       Copy of Healthcare Power of Attorney in Chart? No - copy requested    No - copy requested    Would patient like information on creating a medical advance directive?   No - Patient declined No - Patient declined   No - Patient declined     Current Medications (verified) Outpatient Encounter Medications as of 02/12/2023  Medication Sig   Accu-Chek Softclix Lancets lancets USE TO CHECK BLOOD SUGAR DAILY   albuterol (VENTOLIN HFA) 108 (90 Base) MCG/ACT inhaler Inhale 2 puffs into the lungs every 6 (six) hours as needed for wheezing or shortness of breath.   allopurinol (ZYLOPRIM) 100 MG tablet Take 1 tablet (100 mg total) by mouth daily.   amLODipine (NORVASC) 2.5 MG tablet Take 2.5 mg by mouth daily.   benzonatate (TESSALON) 100 MG capsule Take 1 capsule (100 mg total) by mouth 2 (two) times daily as needed for cough.   Blood Glucose Monitoring Suppl (ACCU-CHEK GUIDE) w/Device KIT 1 each by Does not apply route daily. Use to check blood sugar daily   Calcium Carb-Cholecalciferol (CALCIUM 600+D3 PO) Take 1 tablet by mouth daily.   carvedilol (COREG) 25 MG tablet Take 1 tablet (25 mg total) by mouth 2 (two) times daily with a meal.   furosemide (LASIX) 40 MG tablet Take 1 tablet (40 mg total) by mouth 2 (two) times daily.   glipiZIDE (GLUCOTROL XL) 2.5 MG 24 hr tablet TAKE 1 TABLET(2.5 MG) BY MOUTH DAILY WITH BREAKFAST   glucose blood (ACCU-CHEK GUIDE) test strip Use as instructed   isosorbide mononitrate (IMDUR) 30 MG 24 hr tablet Take 1 tablet (30 mg total) by mouth daily.   levothyroxine (SYNTHROID) 125 MCG tablet TAKE 1 TABLET EVERY MORNING ON AN EMPTY STOMACH  30 MINUTES BEFORE TAKING OTHER MEDICATIONS OR BEFORE BREAKFAST   sacubitril-valsartan (ENTRESTO) 97-103 MG Take 1 tablet by mouth 2 (two) times daily.   spironolactone (ALDACTONE) 50 MG tablet Take 1 tablet (50 mg total) by mouth daily.   traZODone (DESYREL) 50 MG tablet Take 0.5-1 tablets (25-50 mg total) by mouth at bedtime as needed for sleep.   No facility-administered encounter medications on file as of 02/12/2023.    Allergies (verified) Atorvastatin, Farxiga [dapagliflozin], Ibuprofen, and Jardiance [empagliflozin]   History: Past Medical History:   Diagnosis Date   AICD (automatic cardioverter/defibrillator) present 10/08/2014   SUBQ    /    DR Graciela Husbands   Cataract    CHF (congestive heart failure) (HCC)    Diabetes mellitus without complication (HCC)    GERD (gastroesophageal reflux disease)    History of cardiac cath 05/05/2007   normal-with patent coronaries   History of colonoscopy    History of hiatal hernia    Hyperlipidemia    Hypertension    Kidney failure    Stage 3   Non-ischemic cardiomyopathy (HCC)    EF 28%- reassessment of LV function 2011 with LVEf 45-50%   PAD (peripheral artery disease) (HCC)    lower extremities with ABIs of 0.5 bilaterally   Personal history of goiter    S/P radioactive iodine thyroid ablation    Sleep apnea    uses CPAP   Past Surgical History:  Procedure Laterality Date   BIOPSY  10/13/2020   Procedure: BIOPSY;  Surgeon: Shellia Cleverly, DO;  Location: [facility];  Service: Gastroenterology;;   BIOPSY  11/10/2021   Procedure: BIOPSY;  Surgeon: Shellia Cleverly, DO;  Location: [facility];  Service: Gastroenterology;;   CATARACT EXTRACTION Bilateral    COLONOSCOPY WITH PROPOFOL N/A 10/13/2020   Procedure: COLONOSCOPY WITH PROPOFOL;  Surgeon: Shellia Cleverly, DO;  Location: [facility];  Service: Gastroenterology;  Laterality: N/A;   COLONOSCOPY WITH PROPOFOL N/A 11/10/2021   Procedure: COLONOSCOPY WITH PROPOFOL;  Surgeon: Shellia Cleverly, DO;  Location: [facility];  Service: Gastroenterology;  Laterality: N/A;   EP IMPLANTABLE DEVICE N/A 10/08/2014   Procedure: SubQ ICD Implant;  Surgeon: Duke Salvia, MD;  Location: [facility] INVASIVE CV LAB;  Service: Cardiovascular;  Laterality: N/A;   POLYPECTOMY  10/13/2020   Procedure: POLYPECTOMY;  Surgeon: Shellia Cleverly, DO;  Location: [facility];  Service: Gastroenterology;;   Leontine Locket ICD CHANGEOUT N/A 11/14/2019   Procedure: ZOXW ICD CHANGEOUT;  Surgeon: Duke Salvia, MD;  Location: [facility] INVASIVE CV LAB;  Service: Cardiovascular;   Laterality: N/A;   THYROIDECTOMY     TONSILLECTOMY     TOTAL ABDOMINAL HYSTERECTOMY     Family History  Problem Relation Age of Onset   Diabetes type II Mother    Hypertension Mother    Diabetes Mother    Heart disease Mother    Other Father        deceased from accident age 42   Hyperlipidemia Father    Breast cancer Neg Hx    Stomach cancer Neg Hx    Colon cancer Neg Hx    Esophageal cancer Neg Hx    Pancreatic cancer Neg Hx    Social History   Socioeconomic History   Marital status: Divorced    Spouse name: Not on file   Number of children: 1   Years of education: Not on file   Highest education level: Some college, no degree  Occupational History   Occupation: Retired  Employer: RETIRED    Comment: Engineer, site  Tobacco Use   Smoking status: Light Smoker    Types: Cigarettes   Smokeless tobacco: Never   Tobacco comments:    Smokes a pack per week.  05/24/2022 hfb  Vaping Use   Vaping status: Never Used  Substance and Sexual Activity   Alcohol use: Not Currently    Comment: rare glass of wine   Drug use: No   Sexual activity: Not Currently    Birth control/protection: None  Other Topics Concern   Not on file  Social History Narrative   Retired Engineer, site   Divorced - one grown son   current smoker    Alcohol use-no      Drug use-no    Regular exercise- no     son - Orvella Dushaj   Social Determinants of Health   Financial Resource Strain: Low Risk  (02/12/2023)   Overall Financial Resource Strain (CARDIA)    Difficulty of Paying Living Expenses: Not hard at all  Food Insecurity: No Food Insecurity (02/12/2023)   Hunger Vital Sign    Worried About Running Out of Food in the Last Year: Never true    Ran Out of Food in the Last Year: Never true  Transportation Needs: No Transportation Needs (02/12/2023)   PRAPARE - Administrator, Civil Service (Medical): No    Lack of Transportation (Non-Medical): No  Physical Activity:  Insufficiently Active (02/12/2023)   Exercise Vital Sign    Days of Exercise per Week: 1 day    Minutes of Exercise per Session: 10 min  Stress: No Stress Concern Present (02/12/2023)   [employer] of Occupational Health - Occupational Stress Questionnaire    Feeling of Stress : Not at all  Social Connections: Moderately Integrated (02/12/2023)   Social Connection and Isolation Panel [NHANES]    Frequency of Communication with Friends and Family: More than three times a week    Frequency of Social Gatherings with Friends and Family: More than three times a week    Attends Religious Services: More than 4 times per year    Active Member of [employer] or Organizations: Yes    Attends Engineer, structural: More than 4 times per year    Marital Status: Divorced    Tobacco Counseling Ready to quit: Yes Counseling given: Not Answered Tobacco comments: Smokes a pack per week.  05/24/2022 hfb   Clinical Intake:  Pre-visit preparation completed: Yes  Pain : 0-10 Pain Score: 8  Pain Type: Acute pain Pain Location: Throat Pain Descriptors / Indicators: Sore Pain Onset: 1 to 4 weeks ago Pain Frequency: Constant     Nutritional Status: BMI > 30  Obese Nutritional Risks: None Diabetes: Yes CBG done?: No Did pt. bring in CBG monitor from home?: No  How often do you need to have someone help you when you read instructions, pamphlets, or other written materials from your doctor or pharmacy?: 1 - Never  Interpreter Needed?: No  Information entered by :: NAllen LPN   Activities of Daily Living    02/12/2023    9:00 AM  In your present state of health, do you have any difficulty performing the following activities:  Hearing? 1  Comment does not have hearing aids any more  Vision? 0  Difficulty concentrating or making decisions? 0  Walking or climbing stairs? 1  Dressing or bathing? 0  Doing errands, shopping? 0  Preparing Food and eating ? N  Using  the Toilet? N   In the past six months, have you accidently leaked urine? Y  Comment with cough or sneeze  Do you have problems with loss of bowel control? N  Managing your Medications? N  Managing your Finances? N  Housekeeping or managing your Housekeeping? N    Patient Care Team: Deeann Saint, MD as PCP - General (Family Medicine) Tonny Bollman, MD as PCP - Cardiology (Cardiology) Duke Salvia, MD as PCP - Electrophysiology (Cardiology) Shellia Cleverly, DO as Consulting Physician (Gastroenterology) Jaci Standard, MD as Consulting Physician (Hematology and Oncology) Herschel Senegal, OD (Optometry) [place], Milas Kocher, [facility] (Pharmacist)  Indicate any recent Medical Services you may have received from other than [facility] providers in the past year (date may be approximate).     Assessment:   This is a routine wellness examination for Paula Stein.  Hearing/Vision screen Hearing Screening - Comments:: Had hearing aids Vision Screening - Comments:: No regular eye exams, Dr. Iran Sizer   Goals Addressed             This Visit's Progress    Patient Stated       02/12/2023, wants to lose a little weight       Depression Screen    02/12/2023    9:35 AM 04/21/2022    9:41 AM 12/21/2021   10:15 AM 10/21/2021   10:26 AM 09/09/2021    8:53 AM 10/20/2020    2:41 PM 10/20/2020    2:39 PM  PHQ 2/9 Scores  PHQ - 2 Score 0 0 0 0 0 0 0  PHQ- 9 Score 3 0 2 0 3      Fall Risk    02/12/2023    9:00 AM 02/04/2023    4:39 PM 04/21/2022    9:42 AM 12/21/2021   10:15 AM 10/21/2021   10:32 AM  Fall Risk   Falls in the past year? 0 0 0 0 0  Number falls in past yr: 0  0 0 0  Injury with Fall? 0  0 0 0  Risk for fall due to : Medication side effect  No Fall Risks No Fall Risks No Fall Risks  Follow up Falls prevention discussed;Falls evaluation completed  Falls evaluation completed Falls evaluation completed     MEDICARE RISK AT HOME: Medicare Risk at Home Any stairs in or around the home?:  Yes If so, are there any without handrails?: No Home free of loose throw rugs in walkways, pet beds, electrical cords, etc?: Yes Adequate lighting in your home to reduce risk of falls?: Yes Life alert?: No Use of a cane, walker or w/c?: No Grab bars in the bathroom?: No Shower chair or bench in shower?: Yes Elevated toilet seat or a handicapped toilet?: Yes  TIMED UP AND GO:  Was the test performed?  No    Cognitive Function:    09/04/2017    9:37 AM 08/31/2016   10:00 AM  MMSE - Mini Mental State Exam  Not completed: -- --        02/12/2023    9:37 AM 10/21/2021   10:34 AM 10/21/2019    9:18 AM  6CIT Screen  What Year? 0 points 0 points 0 points  What month? 0 points 0 points 0 points  What time? 0 points 0 points 0 points  Count back from 20 0 points 0 points 0 points  Months in reverse 0 points 0 points 0 points  Repeat phrase 0 points 0  points 0 points  Total Score 0 points 0 points 0 points    Immunizations Immunization History  Administered Date(s) Administered   Fluad Quad(high Dose 65+) 12/24/2018, 12/15/2019, 01/14/2021, 12/21/2021   Influenza Split 02/15/2011   Influenza Whole 08/22/2004, 02/18/2007, 12/31/2008, 02/17/2010, 01/02/2012   Influenza, High Dose Seasonal PF 01/04/2015, 12/11/2016, 01/03/2018   Influenza,inj,Quad PF,6+ Mos 06/26/2013, 02/03/2014   Influenza-Unspecified 02/02/2016   Moderna Sars-Covid-2 Vaccination 04/18/2019, 05/16/2019, 02/03/2020, 09/14/2020, 01/04/2021   Pneumococcal Conjugate-13 06/26/2013   Pneumococcal Polysaccharide-23 08/22/2004, 02/03/2014   Td 12/22/2002   Tdap 06/26/2013, 01/18/2021   Zoster Recombinant(Shingrix) 10/22/2019, 01/02/2020   Zoster, Live 07/24/2014    TDAP status: Up to date  Flu Vaccine status: Due, Education has been provided regarding the importance of this vaccine. Advised may receive this vaccine at local pharmacy or Health Dept. Aware to provide a copy of the vaccination record if obtained from  local pharmacy or Health Dept. Verbalized acceptance and understanding.  Pneumococcal vaccine status: Up to date  Covid-19 vaccine status: Completed vaccines  Qualifies for Shingles Vaccine? Yes   Zostavax completed Yes   Shingrix Completed?: Yes  Screening Tests Health Maintenance  Topic Date Due   OPHTHALMOLOGY EXAM  06/12/2021   INFLUENZA VACCINE  11/02/2022   COVID-19 Vaccine (6 - 2023-24 season) 12/03/2022   Diabetic kidney evaluation - Urine ACR  12/22/2022   FOOT EXAM  12/22/2022   HEMOGLOBIN A1C  08/05/2023   Diabetic kidney evaluation - eGFR measurement  02/05/2024   Medicare Annual Wellness (AWV)  02/12/2024   DTaP/Tdap/Td (4 - Td or Tdap) 01/19/2031   Pneumonia Vaccine 33+ Years old  Completed   DEXA SCAN  Completed   Hepatitis C Screening  Completed   Zoster Vaccines- Shingrix  Completed   HPV VACCINES  Aged Out   Colonoscopy  Discontinued    Health Maintenance  Health Maintenance Due  Topic Date Due   OPHTHALMOLOGY EXAM  06/12/2021   INFLUENZA VACCINE  11/02/2022   COVID-19 Vaccine (6 - 2023-24 season) 12/03/2022   Diabetic kidney evaluation - Urine ACR  12/22/2022   FOOT EXAM  12/22/2022    Colorectal cancer screening: No longer required.   Mammogram status: No longer required due to age.  Bone Density status: Completed 01/01/2020.   Lung Cancer Screening: (Low Dose CT Chest recommended if Age 10-80 years, 20 pack-year currently smoking OR have quit w/in 15years.) does not qualify.   Lung Cancer Screening Referral: no  Additional Screening:  Hepatitis C Screening: does qualify; Completed 06/02/2015  Vision Screening: Recommended annual ophthalmology exams for early detection of glaucoma and other disorders of the eye. Is the patient up to date with their annual eye exam?  No  Who is the provider or what is the name of the office in which the patient attends annual eye exams? Dr. Iran Sizer If pt is not established with a provider, would they like to be  referred to a provider to establish care? No .   Dental Screening: Recommended annual dental exams for proper oral hygiene  Diabetic Foot Exam: Diabetic Foot Exam: Overdue, Pt has been advised about the importance in completing this exam. Pt is scheduled for diabetic foot exam on next appointment.  Community Resource Referral / Chronic Care Management: CRR required this visit?  No   CCM required this visit?  No     Plan:     I have personally reviewed and noted the following in the patient's chart:   Medical and social history Use  of alcohol, tobacco or illicit drugs  Current medications and supplements including opioid prescriptions. Patient is not currently taking opioid prescriptions. Functional ability and status Nutritional status Physical activity Advanced directives List of other physicians Hospitalizations, surgeries, and ER visits in previous 12 months Vitals Screenings to include cognitive, depression, and falls Referrals and appointments  In addition, I have reviewed and discussed with patient certain preventive protocols, quality metrics, and best practice recommendations. A written personalized care plan for preventive services as well as general preventive health recommendations were provided to patient.     Barb Merino, LPN   [id number]   After Visit Summary: (MyChart) Due to this being a telephonic visit, the after visit summary with patients personalized plan was offered to patient via MyChart   Nurse Notes: none

## 2023-02-12 NOTE — Patient Instructions (Addendum)
Paula Stein , Thank you for taking time to come for your Medicare Wellness Visit. I appreciate your ongoing commitment to your health goals. Please review the following plan we discussed and let me know if I can assist you in the future.   Referrals/Orders/Follow-Ups/Clinician Recommendations: none  This is a list of the screening recommended for you and due dates:  Health Maintenance  Topic Date Due   Eye exam for diabetics  06/12/2021   Flu Shot  11/02/2022   Yearly kidney health urinalysis for diabetes  12/22/2022   Complete foot exam   12/22/2022   COVID-19 Vaccine (7 - 2023-24 season) 03/07/2023   Hemoglobin A1C  08/05/2023   Yearly kidney function blood test for diabetes  02/05/2024   Medicare Annual Wellness Visit  02/12/2024   DTaP/Tdap/Td vaccine (4 - Td or Tdap) 01/19/2031   Pneumonia Vaccine  Completed   DEXA scan (bone density measurement)  Completed   Hepatitis C Screening  Completed   Zoster (Shingles) Vaccine  Completed   HPV Vaccine  Aged Out   Colon Cancer Screening  Discontinued    Advanced directives: (Copy Requested) Please bring a copy of your health care power of attorney and living will to the office to be added to your chart at your convenience.  Next Medicare Annual Wellness Visit scheduled for next year: Yes  Insert Preventive Care attachment Insert FALL PREVENTION attachment if needed

## 2023-02-12 NOTE — Telephone Encounter (Signed)
Pt call and stated she want dr. Salomon Fick to call in something stronger because she can't sleep at night because of the bad coughing .

## 2023-02-14 ENCOUNTER — Ambulatory Visit: Payer: Medicare HMO | Admitting: Family Medicine

## 2023-02-15 ENCOUNTER — Other Ambulatory Visit: Payer: Self-pay | Admitting: Family Medicine

## 2023-02-15 DIAGNOSIS — R059 Cough, unspecified: Secondary | ICD-10-CM

## 2023-02-15 MED ORDER — HYDROCODONE BIT-HOMATROP MBR 5-1.5 MG/5ML PO SOLN: 5.0000 mL | Freq: Four times a day (QID) | ORAL | 0 refills | Status: DC | PRN

## 2023-02-15 NOTE — Telephone Encounter (Signed)
Cough syrup sent to pharmacy.

## 2023-02-16 NOTE — Telephone Encounter (Signed)
Patients is aware

## 2023-02-20 ENCOUNTER — Other Ambulatory Visit: Payer: Self-pay

## 2023-02-20 NOTE — Progress Notes (Addendum)
02/20/2023 Name: Paula Stein MRN: [id number] DOB: Jan 12, 1946  Chief Complaint  Patient presents with   Hypertension   Medication Management   Hyperlipidemia    Paula Stein is a 77 y.o. year old female who presented for a telephone visit.   They were referred to the pharmacist by their PCP for assistance in managing hypertension, hyperlipidemia, and complex medication management.    Subjective:  Care Team: Primary Care Provider: Deeann Saint, MD   Medication Access/Adherence  Current Pharmacy:  [facility] [phone] - [place] Otto, [place] - [address] AT [facility] [address] [place] [address] Phone: [phone] Fax: [phone]   Patient reports affordability concerns with their medications: No  Patient reports access/transportation concerns to their pharmacy: No  Patient reports adherence concerns with their medications:  No     Hypertension:  Current medications: Amlodipine 2.5mg , Carvedilol 25mg  BID, Lasix 40mg  BID, Entresto 97-103 BID, Spironolactone 50mg   Medications previously tried: Hydralazine, hydrochlorothiazide, Losartan, Valsartan  Patient has a validated, automated, upper arm home BP cuff Current blood pressure readings readings: 152/74 this morning (without medication), 144/78, 144/80  Patient denies hypotensive s/sx including dizziness, lightheadedness.  Patient denies hypertensive symptoms including headache, chest pain, shortness of breath  Current meal patterns: mindful of salt intake  Current physical activity: goes to gym occassionaly   Hyperlipidemia/ASCVD Risk Reduction  Current lipid lowering medications: None Medications tried in the past: Atorvastatin (myalgia)    Objective:  Lab Results  Component Value Date   HGBA1C 6.9 (H) 02/05/2023    Lab Results  Component Value Date   CREATININE 1.71 (H) 02/05/2023   BUN 19 02/05/2023   NA 139 02/05/2023   K 3.9 02/05/2023    CL 105 02/05/2023   CO2 28 02/05/2023    Lab Results  Component Value Date   CHOL 236 (H) 12/21/2021   HDL 49.40 12/21/2021   LDLCALC 161 (H) 12/21/2021   LDLDIRECT 195.2 02/15/2011   TRIG 128.0 12/21/2021   CHOLHDL 5 12/21/2021    Medications Reviewed Today     Reviewed by Sherrill Raring, RPH (Pharmacist) on 02/20/23 at 0940  Med List Status: <None>   Medication Order Taking? Sig Documenting Provider Last Dose Status Informant  Accu-Chek Softclix Lancets lancets [id number]  USE TO CHECK BLOOD SUGAR DAILY Deeann Saint, MD  Active Self  albuterol (VENTOLIN HFA) 108 (90 Base) MCG/ACT inhaler [id number] Yes Inhale 2 puffs into the lungs every 6 (six) hours as needed for wheezing or shortness of breath. Deeann Saint, MD Taking Active   allopurinol (ZYLOPRIM) 100 MG tablet [id number] Yes Take 1 tablet (100 mg total) by mouth daily. Deeann Saint, MD Taking Active   amLODipine (NORVASC) 2.5 MG tablet [id number] Yes Take 2.5 mg by mouth daily. [provider] Taking Active   benzonatate (TESSALON) 100 MG capsule [id number] No Take 1 capsule (100 mg total) by mouth 2 (two) times daily as needed for cough.  Patient not taking: Reported on 02/20/2023   Deeann Saint, MD Not Taking Active   Blood Glucose Monitoring Suppl (ACCU-CHEK GUIDE) w/Device KIT [id number]  1 each by Does not apply route daily. Use to check blood sugar daily Deeann Saint, MD  Active   Calcium Carb-Cholecalciferol (CALCIUM 600+D3 PO) [id number] Yes Take 1 tablet by mouth daily. [provider] Taking Active Self  carvedilol (COREG) 25 MG tablet [id number] Yes Take 1 tablet (25 mg total) by  mouth 2 (two) times daily with a meal. Deeann Saint, MD Taking Active   furosemide (LASIX) 40 MG tablet [id number] Yes Take 1 tablet (40 mg total) by mouth 2 (two) times daily. Deeann Saint, MD Taking Active   glipiZIDE (GLUCOTROL XL) 2.5 MG 24 hr tablet [id number] Yes TAKE 1 TABLET(2.5 MG) BY MOUTH  DAILY WITH BREAKFAST Deeann Saint, MD Taking Active   glucose blood (ACCU-CHEK GUIDE) test strip [id number]  Use as instructed Deeann Saint, MD  Active   HYDROcodone bit-homatropine (HYCODAN) 5-1.5 MG/5ML syrup [id number] Yes Take 5 mLs by mouth every 6 (six) hours as needed for cough. Deeann Saint, MD Taking Active   isosorbide mononitrate (IMDUR) 30 MG 24 hr tablet [id number] Yes Take 1 tablet (30 mg total) by mouth daily. Deeann Saint, MD Taking Active   levothyroxine (SYNTHROID) 125 MCG tablet [id number] Yes TAKE 1 TABLET EVERY MORNING ON AN EMPTY STOMACH 30 MINUTES BEFORE TAKING OTHER MEDICATIONS OR BEFORE BREAKFAST Deeann Saint, MD Taking Active   sacubitril-valsartan (ENTRESTO) 97-103 MG [id number] Yes Take 1 tablet by mouth 2 (two) times daily. Duke Salvia, MD Taking Active   spironolactone (ALDACTONE) 50 MG tablet [id number] Yes Take 1 tablet (50 mg total) by mouth daily. Deeann Saint, MD Taking Active   traZODone (DESYREL) 50 MG tablet [id number] Yes Take 0.5-1 tablets (25-50 mg total) by mouth at bedtime as needed for sleep. Deeann Saint, MD Taking Active               Assessment/Plan:   Hypertension: - Currently uncontrolled - Reviewed long term cardiovascular and renal outcomes of uncontrolled blood pressure - Reviewed appropriate blood pressure monitoring technique and reviewed goal blood pressure. Recommended to check home blood pressure and heart rate daily -Counseled on proper BP monitoring technique, has been checking in the morning prior to her meds or immediately after taking her meds, not rested -Recommend to continue current medication therapy     Hyperlipidemia/ASCVD Risk Reduction: - Currently uncontrolled.  - Reviewed long term complications of uncontrolled cholesterol - Reviewed dietary recommendations including limiting fried/fatty foods - Reviewed lifestyle recommendations including increasing physical activity - Recommend to get  updated lipid panel at next cardio visit. If still elevated, need to consider addition of zetia  .    Follow Up Plan: 1 week  Sherrill Raring, PharmD Clinical Pharmacist [phone]

## 2023-02-22 ENCOUNTER — Other Ambulatory Visit: Payer: Self-pay | Admitting: Family Medicine

## 2023-02-22 DIAGNOSIS — E039 Hypothyroidism, unspecified: Secondary | ICD-10-CM

## 2023-03-23 ENCOUNTER — Encounter: Payer: Self-pay | Admitting: Family Medicine

## 2023-03-26 ENCOUNTER — Other Ambulatory Visit (INDEPENDENT_AMBULATORY_CARE_PROVIDER_SITE_OTHER): Payer: Medicare HMO

## 2023-03-26 VITALS — BP 120/74

## 2023-03-26 DIAGNOSIS — I10 Essential (primary) hypertension: Secondary | ICD-10-CM

## 2023-03-26 NOTE — Progress Notes (Signed)
03/26/2023 Name: Paula Stein MRN: [id number] DOB: 1945/05/25  Chief Complaint  Patient presents with   Hypertension   Medication Management    Paula Stein is a 77 y.o. year old female who presented for a telephone visit.   They were referred to the pharmacist by their PCP for assistance in managing hypertension, hyperlipidemia, and complex medication management.    Subjective:  Care Team: Primary Care Provider: Deeann Saint, MD   Medication Access/Adherence  Current Pharmacy:  [facility] [phone] - [place] Otto, [place] - [address] AT [facility] [address] [place] [address] Phone: [phone] Fax: [phone]   Patient reports affordability concerns with their medications: No  Patient reports access/transportation concerns to their pharmacy: No  Patient reports adherence concerns with their medications:  No     Hypertension:  Current medications: Amlodipine 2.5mg , Carvedilol 25mg  BID, Lasix 40mg  BID, Entresto 97-103 BID, Spironolactone 50mg   Medications previously tried: Hydralazine, hydrochlorothiazide, Losartan, Valsartan  Patient has a validated, automated, upper arm home BP cuff Current blood pressure readings readings: 120/74 this morning, 130/80 sat/sun  Patient denies hypotensive s/sx including dizziness, lightheadedness.  Patient denies hypertensive symptoms including headache, chest pain, shortness of breath  Current meal patterns: mindful of salt intake  Current physical activity: goes to gym occassionaly     Objective:  Lab Results  Component Value Date   HGBA1C 6.9 (H) 02/05/2023    Lab Results  Component Value Date   CREATININE 1.71 (H) 02/05/2023   BUN 19 02/05/2023   NA 139 02/05/2023   K 3.9 02/05/2023   CL 105 02/05/2023   CO2 28 02/05/2023    Lab Results  Component Value Date   CHOL 236 (H) 12/21/2021   HDL 49.40 12/21/2021   LDLCALC 161 (H) 12/21/2021   LDLDIRECT  195.2 02/15/2011   TRIG 128.0 12/21/2021   CHOLHDL 5 12/21/2021    Medications Reviewed Today     Reviewed by Sherrill Raring, RPH (Pharmacist) on 03/26/23 at [id number]  Med List Status: <None>   Medication Order Taking? Sig Documenting Provider Last Dose Status Informant  Accu-Chek Softclix Lancets lancets [id number] No USE TO CHECK BLOOD SUGAR DAILY Deeann Saint, MD Taking Active Self  albuterol (VENTOLIN HFA) 108 (90 Base) MCG/ACT inhaler [id number] No Inhale 2 puffs into the lungs every 6 (six) hours as needed for wheezing or shortness of breath. Deeann Saint, MD Taking Active   allopurinol (ZYLOPRIM) 100 MG tablet [id number] No Take 1 tablet (100 mg total) by mouth daily. Deeann Saint, MD Taking Active   amLODipine (NORVASC) 2.5 MG tablet [id number] No Take 2.5 mg by mouth daily. [provider] Taking Active   benzonatate (TESSALON) 100 MG capsule [id number] No Take 1 capsule (100 mg total) by mouth 2 (two) times daily as needed for cough.  Patient not taking: Reported on 02/20/2023   Deeann Saint, MD Not Taking Active   Blood Glucose Monitoring Suppl (ACCU-CHEK GUIDE) w/Device KIT [id number] No 1 each by Does not apply route daily. Use to check blood sugar daily Deeann Saint, MD Taking Active   Calcium Carb-Cholecalciferol (CALCIUM 600+D3 PO) [id number] No Take 1 tablet by mouth daily. [provider] Taking Active Self  carvedilol (COREG) 25 MG tablet [id number] No Take 1 tablet (25 mg total) by mouth 2 (two) times daily with a meal. Deeann Saint, MD Taking Active   furosemide (LASIX) 40 MG tablet [id number]  No Take 1 tablet (40 mg total) by mouth 2 (two) times daily. Deeann Saint, MD Taking Active   glipiZIDE (GLUCOTROL XL) 2.5 MG 24 hr tablet [id number]  TAKE 1 TABLET(2.5 MG) BY MOUTH DAILY WITH BREAKFAST Deeann Saint, MD  Active   glucose blood (ACCU-CHEK GUIDE) test strip [id number] No Use as instructed Deeann Saint, MD Taking Active    HYDROcodone bit-homatropine (HYCODAN) 5-1.5 MG/5ML syrup [id number] No Take 5 mLs by mouth every 6 (six) hours as needed for cough. Deeann Saint, MD Taking Active   isosorbide mononitrate (IMDUR) 30 MG 24 hr tablet [id number] No Take 1 tablet (30 mg total) by mouth daily. Deeann Saint, MD Taking Active   levothyroxine (SYNTHROID) 125 MCG tablet [id number]  TAKE 1 TABLET BY MOUTH EVERY MORNING ON EMPTY STOMACH 30 MINUTES BEFORE OTHER MEDICATIONS OR BEFORE BREAKFAST Deeann Saint, MD  Active   sacubitril-valsartan (ENTRESTO) 97-103 MG [id number] No Take 1 tablet by mouth 2 (two) times daily. Duke Salvia, MD Taking Active   spironolactone (ALDACTONE) 50 MG tablet [id number] No Take 1 tablet (50 mg total) by mouth daily. Deeann Saint, MD Taking Active   traZODone (DESYREL) 50 MG tablet [id number] No Take 0.5-1 tablets (25-50 mg total) by mouth at bedtime as needed for sleep. Deeann Saint, MD Taking Active               Assessment/Plan:   Hypertension: - Currently controlled - Reviewed long term cardiovascular and renal outcomes of uncontrolled blood pressure - Reviewed appropriate blood pressure monitoring technique and reviewed goal blood pressure. Recommended to check home blood pressure and heart rate daily -Recommend to continue current medication therapy        Follow Up Plan: 6 months  Sherrill Raring, PharmD Clinical Pharmacist [phone]

## 2023-03-29 DIAGNOSIS — N1832 Chronic kidney disease, stage 3b: Secondary | ICD-10-CM | POA: Diagnosis not present

## 2023-04-05 DIAGNOSIS — N2581 Secondary hyperparathyroidism of renal origin: Secondary | ICD-10-CM | POA: Diagnosis not present

## 2023-04-05 DIAGNOSIS — N184 Chronic kidney disease, stage 4 (severe): Secondary | ICD-10-CM | POA: Diagnosis not present

## 2023-04-05 DIAGNOSIS — I129 Hypertensive chronic kidney disease with stage 1 through stage 4 chronic kidney disease, or unspecified chronic kidney disease: Secondary | ICD-10-CM | POA: Diagnosis not present

## 2023-04-05 DIAGNOSIS — R809 Proteinuria, unspecified: Secondary | ICD-10-CM | POA: Diagnosis not present

## 2023-04-05 DIAGNOSIS — E1122 Type 2 diabetes mellitus with diabetic chronic kidney disease: Secondary | ICD-10-CM | POA: Diagnosis not present

## 2023-04-06 ENCOUNTER — Telehealth: Payer: Self-pay

## 2023-04-06 NOTE — Telephone Encounter (Signed)
 Med Rec complete, allergies and Pharmacy verified January 3

## 2023-04-08 NOTE — Progress Notes (Deleted)
 Cardiology Office Note:    Date:  04/08/2023   ID:  Paula Stein, DOB 07-06-1945, MRN [id number]  PCP:  Paula Clotilda JONELLE, MD   [facility] Providers Cardiologist:  Ozell Fell, MD Electrophysiologist:  Elspeth Sage, MD     Referring MD: Paula Clotilda JONELLE, MD   No chief complaint on file.   History of Present Illness:    Paula Stein is a 78 y.o. 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 Dorsey/hematology)   Current Medications: No outpatient medications have been marked as taking for the 04/09/23 encounter (Appointment) with Fell Ozell, MD.     Allergies:   Atorvastatin , Farxiga  [dapagliflozin ], Ibuprofen, and Jardiance  [empagliflozin ]   ROS:   Please see the history of present illness.    *** All other systems reviewed and are negative.  EKGs/Labs/Other Studies Reviewed:    The following studies were reviewed today: Cardiac Studies & Procedures      ECHOCARDIOGRAM  ECHOCARDIOGRAM COMPLETE 07/07/2021  Narrative ECHOCARDIOGRAM REPORT    Patient Name:   Paula Stein Date of Exam: 07/07/2021 Medical Rec #:  [id number]     Height:       70.0 in Accession #:    [id number]    Weight:       215.0 lb Date of Birth:  [birth year]      BSA:          2.152 m Patient Age:    75 years      BP:           166/136 mmHg Patient Gender: F             HR:           72 bpm. Exam Location:  Inpatient  Procedure: 2D Echo, Cardiac Doppler and Color Doppler  Indications:    CHF  History:        Patient has prior history of Echocardiogram examinations, most recent 08/12/2020. CHF and Cardiomyopathy; Signs/Symptoms:Shortness of Breath. AICD 10/08/14.  Sonographer:    EMMIE DEW RDCS Referring Phys: [id number]  JUSTIN B HOWERTER  IMPRESSIONS   1. Left ventricular ejection fraction, by estimation, is 30 to 35%. The left ventricle has moderately decreased function. The left ventricle demonstrates global hypokinesis. There is mild left ventricular hypertrophy. Left ventricular diastolic parameters are consistent with Grade I diastolic dysfunction (impaired relaxation). Elevated left ventricular end-diastolic pressure. The E/e' is 15. 2. Right ventricular systolic function is moderately reduced. The right ventricular size is normal. 3. Left atrial size was mildly dilated. 4. The pericardial effusion is posterior to the left ventricle. 5. The mitral valve is abnormal. Trivial mitral valve regurgitation. 6. The aortic valve is tricuspid. Aortic valve regurgitation is not visualized.  Comparison(s): No significant change from prior study. 08/12/2020: LVEF 30-35%.  FINDINGS Left Ventricle: Left ventricular ejection fraction, by estimation, is 30 to 35%. The left ventricle has moderately decreased function. The left ventricle demonstrates global hypokinesis. The left ventricular internal cavity size was normal in size. There is mild left ventricular hypertrophy. Left ventricular diastolic parameters are consistent with Grade I diastolic dysfunction (impaired relaxation). Elevated left ventricular end-diastolic pressure. The E/e' is 15.  Right Ventricle: The right ventricular size is normal. No increase in right ventricular  wall thickness. Right ventricular systolic function is moderately reduced.  Left Atrium: Left atrial size was mildly dilated.  Right Atrium: Right atrial size was normal in size.  Pericardium: Trivial pericardial effusion is present. The pericardial effusion is posterior to the left ventricle.  Mitral Valve: The mitral valve is abnormal. There is mild thickening of the anterior and posterior mitral valve leaflet(s). Trivial mitral valve regurgitation.  Tricuspid Valve: The tricuspid  valve is not well visualized. Tricuspid valve regurgitation is not demonstrated.  Aortic Valve: The aortic valve is tricuspid. Aortic valve regurgitation is not visualized. Aortic valve mean gradient measures 2.0 mmHg. Aortic valve peak gradient measures 4.1 mmHg. Aortic valve area, by VTI measures 2.15 cm.  Pulmonic Valve: The pulmonic valve was not well visualized. Pulmonic valve regurgitation is trivial.  Aorta: The aortic root and ascending aorta are structurally normal, with no evidence of dilitation.  Venous: The inferior vena cava was not well visualized.  IAS/Shunts: No atrial level shunt detected by color flow Doppler.   LEFT VENTRICLE PLAX 2D LVIDd:         7.00 cm      Diastology LVIDs:         5.20 cm      LV e' medial:    3.64 cm/s LV PW:         1.20 cm      LV E/e' medial:  14.7 LV IVS:        1.30 cm      LV e' lateral:   3.30 cm/s LVOT diam:     2.00 cm      LV E/e' lateral: 16.2 LV SV:         38 LV SV Index:   18 LVOT Area:     3.14 cm  LV Volumes (MOD) LV vol d, MOD A2C: 118.0 ml LV vol d, MOD A4C: 119.0 ml LV vol s, MOD A2C: 78.6 ml LV vol s, MOD A4C: 66.5 ml LV SV MOD A2C:     39.4 ml LV SV MOD A4C:     119.0 ml LV SV MOD BP:      47.0 ml  RIGHT VENTRICLE RV Basal diam:  3.60 cm RV Mid diam:    2.00 cm RV S prime:     6.66 cm/s TAPSE (M-mode): 1.0 cm  LEFT ATRIUM            Index        RIGHT ATRIUM           Index LA diam:      4.00 cm  1.86 cm/m   RA Area:     15.80 cm LA Vol (A2C): 77.8 ml  36.14 ml/m  RA Volume:   46.80 ml  21.74 ml/m LA Vol (A4C): 104.0 ml 48.32 ml/m AORTIC VALVE                    PULMONIC VALVE AV Area (Vmax):    2.23 cm     PV Vmax:          0.68 m/s AV Area (Vmean):   2.47 cm     PV Vmean:         42.800 cm/s AV Area (VTI):     2.15 cm     PV VTI:           0.123 m AV Vmax:           101.00 cm/s  PV Peak grad:  1.8 mmHg AV Vmean:          68.300 cm/s  PV Mean grad:     1.0 mmHg AV VTI:            0.178 m       PR End Diast Vel: 4.41 msec AV Peak Grad:      4.1 mmHg AV Mean Grad:      2.0 mmHg LVOT Vmax:         71.80 cm/s LVOT Vmean:        53.600 cm/s LVOT VTI:          0.122 m LVOT/AV VTI ratio: 0.69  AORTA Ao Root diam: 3.00 cm Ao Asc diam:  3.10 cm  MITRAL VALVE MV Area (PHT): 3.65 cm    SHUNTS MV Decel Time: 208 msec    Systemic VTI:  0.12 m MR Peak grad: 31.4 mmHg    Systemic Diam: 2.00 cm MR Vmax:      280.00 cm/s MV E velocity: 53.60 cm/s MV A velocity: 66.00 cm/s MV E/A ratio:  0.81  Vinie Maxcy MD Electronically signed by Vinie Maxcy MD Signature Date/Time: 07/07/2021/2:35:59 PM    Final             EKG:        Recent Labs: 05/16/2022: ALT 7 02/05/2023: BUN 19; Creatinine, Ser 1.71; Hemoglobin 11.9; Platelets 280.0; Potassium 3.9; Pro B Natriuretic peptide (BNP) 95.0; Sodium 139  Recent Lipid Panel    Component Value Date/Time   CHOL 236 (H) 12/21/2021 1103   CHOL 228 (H) 02/06/2019 1052   TRIG 128.0 12/21/2021 1103   HDL 49.40 12/21/2021 1103   HDL 91 02/06/2019 1052   CHOLHDL 5 12/21/2021 1103   VLDL 25.6 12/21/2021 1103   LDLCALC 161 (H) 12/21/2021 1103   LDLCALC 109 (H) 02/06/2019 1052   LDLDIRECT 195.2 02/15/2011 1625     Risk Assessment/Calculations:   {Does this patient have ATRIAL FIBRILLATION?[phone]}  No BP recorded.  {Refresh Note OR Click here to enter BP  :1}***         Physical Exam:    VS:  There were no vitals taken for this visit.    Wt Readings from Last 3 Encounters:  02/05/23 232 lb 12.8 oz (105.6 kg)  08/03/22 219 lb (99.3 kg)  05/24/22 224 lb (101.6 kg)     GEN: *** Well nourished, well developed in no acute distress HEENT: Normal NECK: No JVD; No carotid bruits LYMPHATICS: No lymphadenopathy CARDIAC: ***RRR, no murmurs, rubs, gallops RESPIRATORY:  Clear to auscultation without rales, wheezing or rhonchi  ABDOMEN: Soft, non-tender, non-distended MUSCULOSKELETAL:  No edema; No deformity  SKIN: Warm and  dry NEUROLOGIC:  Alert and oriented x 3 PSYCHIATRIC:  Normal affect   Assessment & Plan HFrEF (heart failure with reduced ejection fraction) (HCC)  Essential hypertension  Stage 3b chronic kidney disease (CKD) (HCC)  Coronary artery disease involving native coronary artery of native heart without angina pectoris  Tobacco abuse        {Are you ordering a CV Procedure (e.g. stress test, cath, DCCV, TEE, etc)?   Press F2        :[id number]}    Medication Adjustments/Labs and Tests Ordered: Current medicines are reviewed at length with the patient today.  Concerns regarding medicines are outlined above.  No orders of the defined types were placed in this encounter.  No orders of the defined types were placed in this encounter.   There are no  Patient Instructions on file for this visit.   Signed, Ozell Fell, MD  04/08/2023 8:34 PM    [facility]

## 2023-04-09 ENCOUNTER — Ambulatory Visit: Payer: Medicare HMO | Admitting: Cardiovascular Disease

## 2023-04-09 DIAGNOSIS — I2510 Atherosclerotic heart disease of native coronary artery without angina pectoris: Secondary | ICD-10-CM

## 2023-04-09 DIAGNOSIS — Z720 Tobacco use: Secondary | ICD-10-CM

## 2023-04-09 DIAGNOSIS — I5020 Unspecified systolic (congestive) heart failure: Secondary | ICD-10-CM

## 2023-04-09 DIAGNOSIS — N1832 Chronic kidney disease, stage 3b: Secondary | ICD-10-CM

## 2023-04-09 DIAGNOSIS — I10 Essential (primary) hypertension: Secondary | ICD-10-CM

## 2023-04-27 ENCOUNTER — Ambulatory Visit (INDEPENDENT_AMBULATORY_CARE_PROVIDER_SITE_OTHER): Payer: Medicare HMO

## 2023-04-27 DIAGNOSIS — I428 Other cardiomyopathies: Secondary | ICD-10-CM

## 2023-04-27 LAB — CUP PACEART REMOTE DEVICE CHECK
Battery Remaining Percentage: 62 %
Date Time Interrogation Session: 20250124081900
HighPow Impedance: 70 Ohm
Implantable Lead Connection Status: 753985
Implantable Lead Implant Date: 20160707
Implantable Lead Location: 753862
Implantable Lead Model: 3401
Implantable Pulse Generator Implant Date: 20210813
Pulse Gen Serial Number: 143555

## 2023-05-12 ENCOUNTER — Other Ambulatory Visit: Payer: Self-pay | Admitting: Internal Medicine

## 2023-05-12 ENCOUNTER — Other Ambulatory Visit: Payer: Self-pay | Admitting: Family Medicine

## 2023-05-12 DIAGNOSIS — M1A0790 Idiopathic chronic gout, unspecified ankle and foot, without tophus (tophi): Secondary | ICD-10-CM

## 2023-05-14 MED ORDER — SACUBITRIL-VALSARTAN 97-103 MG PO TABS: 1.0000 | ORAL_TABLET | Freq: Two times a day (BID) | ORAL | 1 refills | Status: DC

## 2023-05-17 ENCOUNTER — Ambulatory Visit: Payer: Medicare HMO | Admitting: Cardiovascular Disease

## 2023-05-22 ENCOUNTER — Encounter: Payer: Self-pay | Admitting: Cardiovascular Disease

## 2023-05-23 ENCOUNTER — Ambulatory Visit: Payer: Medicare HMO | Admitting: Cardiovascular Disease

## 2023-05-27 ENCOUNTER — Other Ambulatory Visit: Payer: Self-pay | Admitting: Family Medicine

## 2023-05-31 ENCOUNTER — Other Ambulatory Visit: Payer: Self-pay | Admitting: Family Medicine

## 2023-05-31 DIAGNOSIS — E039 Hypothyroidism, unspecified: Secondary | ICD-10-CM

## 2023-06-04 ENCOUNTER — Encounter: Payer: Self-pay | Admitting: Internal Medicine

## 2023-06-04 ENCOUNTER — Other Ambulatory Visit: Payer: Self-pay | Admitting: Family Medicine

## 2023-06-04 DIAGNOSIS — Z0000 Encounter for general adult medical examination without abnormal findings: Secondary | ICD-10-CM

## 2023-06-06 ENCOUNTER — Ambulatory Visit
Admission: RE | Admit: 2023-06-06 | Discharge: 2023-06-06 | Disposition: A | Discharge status: Home / Self Care | Source: Ambulatory Visit | Attending: Family Medicine | Admitting: Family Medicine

## 2023-06-06 DIAGNOSIS — Z0000 Encounter for general adult medical examination without abnormal findings: Secondary | ICD-10-CM

## 2023-06-06 DIAGNOSIS — Z1231 Encounter for screening mammogram for malignant neoplasm of breast: Secondary | ICD-10-CM | POA: Diagnosis not present

## 2023-06-06 NOTE — Progress Notes (Signed)
 Remote ICD transmission.

## 2023-06-07 ENCOUNTER — Ambulatory Visit: Payer: Medicare HMO | Attending: Physician Assistant | Admitting: Physician Assistant

## 2023-06-07 ENCOUNTER — Encounter: Payer: Self-pay | Admitting: Physician Assistant

## 2023-06-07 VITALS — BP 138/80 | HR 63 | Ht 70.0 in | Wt 234.2 lb

## 2023-06-07 DIAGNOSIS — I2510 Atherosclerotic heart disease of native coronary artery without angina pectoris: Secondary | ICD-10-CM

## 2023-06-07 DIAGNOSIS — I5020 Unspecified systolic (congestive) heart failure: Secondary | ICD-10-CM | POA: Diagnosis not present

## 2023-06-07 DIAGNOSIS — I81 Portal vein thrombosis: Secondary | ICD-10-CM | POA: Diagnosis not present

## 2023-06-07 DIAGNOSIS — Z79899 Other long term (current) drug therapy: Secondary | ICD-10-CM

## 2023-06-07 DIAGNOSIS — I10 Essential (primary) hypertension: Secondary | ICD-10-CM | POA: Diagnosis not present

## 2023-06-07 DIAGNOSIS — I428 Other cardiomyopathies: Secondary | ICD-10-CM | POA: Diagnosis not present

## 2023-06-07 MED ORDER — VALSARTAN 80 MG PO TABS
80.0000 mg | ORAL_TABLET | Freq: Every day | ORAL | 3 refills | Status: DC
Start: 2023-06-07 — End: 2023-06-11

## 2023-06-07 NOTE — Progress Notes (Signed)
 Cardiology Office Note:  .   Date:  06/07/2023  ID:  Paula Stein, DOB 03/15/1946, MRN [id number] PCP: Deeann Saint, MD  [facility] Providers Cardiologist:  Tonny Bollman, MD Electrophysiologist:  Sherryl Manges, MD {  History of Present Illness: .   ROSMARIE Stein is a 78 y.o. female with a past medical history of nonobstructive CAD on cardiac catheterization in 2009, nonischemic cardiomyopathy/chronic combined CHF with a EF of 35% on last echo 08/2020 status post ICD for primary prevention, portal vein thrombosis on Xarelto, hypertension, hyperlipidemia, CKD stage III, OSA on CPAP, GERD here for follow-up appointment.  Was seen by EP 08/03/2022.  Patient reported feeling well at that visit.  Denied chest pain, palpitations, dyspnea, PND, orthopnea, nausea, vomiting, dizziness, syncope, edema, weight gain, early satiety.  Today, she presents with a history of heart failure and diabetes, with concerns about the cost of her medications, Entresto and Ozempic. She reports that the cost of these medications has increased significantly, making it difficult for her to afford. She has tried to apply for patient assistance and use coupons, but these efforts have been unsuccessful.  In addition to the medication cost concerns, the patient reports experiencing shortness of breath. She describes being able to walk, but needing to rest due to the breathlessness. She has adapted to this by pacing her activities, doing what she needs to do, then resting. She reports no swelling in her feet, no lightheadedness, dizziness, or abnormal heartbeats.  The patient also mentions a daily routine of walking around her neighborhood, which she finds beneficial. She expresses a positive outlook, refusing to let her symptoms get the best of her.  Reports no chest pain, pressure, or tightness. No edema, orthopnea, PND. Reports no palpitations.   Discussed the use of AI scribe software for clinical note transcription  with the patient, who gave verbal consent to proceed.  ROS: pertinent ROS in HPI  Studies Reviewed: Marland Kitchen   EKG Interpretation Date/Time:  Thursday June 07 2023 13:39:19 EST Ventricular Rate:  65 PR Interval:  236 QRS Duration:  94 QT Interval:  420 QTC Calculation: 436 R Axis:   20  Text Interpretation: Sinus rhythm with 1st degree A-V block Minimal voltage criteria for LVH, may be normal variant ( R in aVL ) Septal infarct , age undetermined When compared with ECG of 06-Jul-2021 19:44, PREVIOUS ECG IS PRESENT Confirmed by Jari Favre [phone]) on 06/07/2023 1:53:16 PM   Echocardiogram 07/07/21 IMPRESSIONS     1. Left ventricular ejection fraction, by estimation, is 30 to 35%. The  left ventricle has moderately decreased function. The left ventricle  demonstrates global hypokinesis. There is mild left ventricular  hypertrophy. Left ventricular diastolic  parameters are consistent with Grade I diastolic dysfunction (impaired  relaxation). Elevated left ventricular end-diastolic pressure. The E/e' is  15.   2. Right ventricular systolic function is moderately reduced. The right  ventricular size is normal.   3. Left atrial size was mildly dilated.   4. The pericardial effusion is posterior to the left ventricle.   5. The mitral valve is abnormal. Trivial mitral valve regurgitation.   6. The aortic valve is tricuspid. Aortic valve regurgitation is not  visualized.   Comparison(s): No significant change from prior study. 08/12/2020: LVEF  30-35%.   FINDINGS   Left Ventricle: Left ventricular ejection fraction, by estimation, is 30  to 35%. The left ventricle has moderately decreased function. The left  ventricle demonstrates global hypokinesis. The left ventricular  internal  cavity size was normal in size.  There is mild left ventricular hypertrophy. Left ventricular diastolic  parameters are consistent with Grade I diastolic dysfunction (impaired  relaxation). Elevated left  ventricular end-diastolic pressure. The E/e' is  15.   Right Ventricle: The right ventricular size is normal. No increase in  right ventricular wall thickness. Right ventricular systolic function is  moderately reduced.   Left Atrium: Left atrial size was mildly dilated.   Right Atrium: Right atrial size was normal in size.   Pericardium: Trivial pericardial effusion is present. The pericardial  effusion is posterior to the left ventricle.   Mitral Valve: The mitral valve is abnormal. There is mild thickening of  the anterior and posterior mitral valve leaflet(s). Trivial mitral valve  regurgitation.   Tricuspid Valve: The tricuspid valve is not well visualized. Tricuspid  valve regurgitation is not demonstrated.   Aortic Valve: The aortic valve is tricuspid. Aortic valve regurgitation is  not visualized. Aortic valve mean gradient measures 2.0 mmHg. Aortic valve  peak gradient measures 4.1 mmHg. Aortic valve area, by VTI measures 2.15  cm.   Pulmonic Valve: The pulmonic valve was not well visualized. Pulmonic valve  regurgitation is trivial.   Aorta: The aortic root and ascending aorta are structurally normal, with  no evidence of dilitation.   Venous: The inferior vena cava was not well visualized.   IAS/Shunts: No atrial level shunt detected by color flow Doppler.      Physical Exam:   VS:  BP 138/80   Pulse 63   Ht 5\' 10"  (1.778 m)   Wt 234 lb 3.2 oz (106.2 kg)   SpO2 98%   BMI 33.60 kg/m    Wt Readings from Last 3 Encounters:  06/07/23 234 lb 3.2 oz (106.2 kg)  02/05/23 232 lb 12.8 oz (105.6 kg)  08/03/22 219 lb (99.3 kg)    GEN: Well nourished, well developed in no acute distress NECK: No JVD; No carotid bruits CARDIAC: RRR, no murmurs, rubs, gallops RESPIRATORY:  Clear to auscultation without rales, wheezing or rhonchi  ABDOMEN: Soft, non-tender, non-distended EXTREMITIES:  No edema; No deformity   ASSESSMENT AND PLAN: .    Heart Failure with Reduced  Ejection Fraction (HFrEF) HFrEF with previous ejection fraction of 30-35%. Managed exertional dyspnea with rest. No significant peripheral edema. Discontinued Entresto due to cost. - Prescribe valsartan 80 mg as a substitute for Entresto. - Message pharmacist to explore cost-reduction options for [place]. - Ensure adequate refills for current medications.  Chronic Kidney Disease (CKD) CKD with stable kidney function, electrolytes, and fluid levels.  Type 2 Diabetes Mellitus Type 2 Diabetes Mellitus with A1c of 6.9%. Discontinued Ozempic due to cost. Plans to manage through diet and exercise.  Anemia Mild anemia with hemoglobin level of 11.9.  Follow-up Stable on current medications and routine. - Schedule follow-up with Dr. Excell Seltzer in six months, avoiding Thursdays.  Follow-up Stable on current medications and routine. - Schedule follow-up with Dr. Excell Seltzer in six months, avoiding Thursdays.       Dispo: She can f/u in 6 months.  Signed, Sharlene Dory, PA-C

## 2023-06-07 NOTE — Patient Instructions (Addendum)
 Medication Instructions:   START TAKING: VALSARTAN 80 MG ONCE A DAY    STOP TAKING AND REMOVE THIS MEDICATION FROM YOUR MEDICATION LIST:  ENTRESTO    *If you need a refill on your cardiac medications before your next appointment, please call your pharmacy*    Lab Work:NONE ORDERED  TODAY    If you have labs (blood work) drawn today and your tests are completely normal, you will receive your results only by: MyChart Message (if you have MyChart) OR A paper copy in the mail If you have any lab test that is abnormal or we need to change your treatment, we will call you to review the results.    Testing/Procedures: NONE ORDERED  TODAY    Follow-Up: At [facility] you and your health needs are our priority.  As part of our continuing mission to provide you with exceptional heart care, we have created designated Provider Care Teams.  These Care Teams include your primary Cardiologist (physician) and Advanced Practice Providers (APPs -  Physician Assistants and Nurse Practitioners) who all work together to provide you with the care you need, when you need it.  We recommend signing up for the patient portal called "MyChart".  Sign up information is provided on this After Visit Summary.  MyChart is used to connect with patients for Virtual Visits (Telemedicine).  Patients are able to view lab/test results, encounter notes, upcoming appointments, etc.  Non-urgent messages can be sent to your provider as well.   To learn more about what you can do with MyChart, go to [email or web address].    Your next appointment:  NEXT AVAILABLE  PHARM-D FOR MEDICATION MANAGEMENT   6 month(s)  Provider:    Tonny Bollman, MD     Other Instructions    1st Floor: - Lobby - Registration  - Pharmacy  - Lab - Cafe  2nd Floor: - PV Lab - Diagnostic Testing (echo, CT, nuclear med)  3rd Floor: - Vacant  4th Floor: - TCTS (cardiothoracic surgery) - AFib Clinic - Structural  Heart Clinic - Vascular Surgery  - Vascular Ultrasound  5th Floor: - HeartCare Cardiology (general and EP) - Clinical Pharmacy for coumadin, hypertension, lipid, weight-loss medications, and med management appointments    Valet parking services will be available as well.

## 2023-06-08 ENCOUNTER — Telehealth: Payer: Self-pay | Admitting: Pharmacy Technician

## 2023-06-08 ENCOUNTER — Telehealth: Payer: Self-pay | Admitting: Pharmacist

## 2023-06-08 ENCOUNTER — Other Ambulatory Visit (HOSPITAL_COMMUNITY): Payer: Self-pay

## 2023-06-08 ENCOUNTER — Encounter: Payer: Self-pay | Admitting: Pharmacy Technician

## 2023-06-08 MED ORDER — LOSARTAN POTASSIUM 100 MG PO TABS: 100.0000 mg | ORAL_TABLET | Freq: Every day | ORAL | 3 refills | Status: DC

## 2023-06-08 NOTE — Telephone Encounter (Signed)
 Patient called back and informed her about Kennedy Bucker and [employer]. Patient was very Adult nurse.

## 2023-06-08 NOTE — Telephone Encounter (Signed)
 Received message from [facility] about patient unable to afford her Entresto.  Valsartan 80 mg daily was called in for her today.  She was previously on Entresto 97 103 it appears in the chart.  We are able to get her health will grant so that she can stay on Entresto however her blood pressure yesterday was 138/80.  Will need to know if patient has been taking Entresto to determine what the most appropriate Entresto dose to resume is.  Left voicemail for patient to call back.

## 2023-06-08 NOTE — Telephone Encounter (Signed)
 Patient Advocate Encounter   The patient was approved for a [employer] grant that will help cover the cost of entresto Total amount awarded, 10,000.00.  Effective: 05/09/23 - 05/07/24   [id number] [id number] [employer] ID: [id number]   Pharmacy provided with approval and processing information. Patient informed via mychart

## 2023-06-08 NOTE — Telephone Encounter (Signed)
 Attempted phone call to pt x 2.  See MyChart messages for details.

## 2023-06-11 NOTE — Addendum Note (Signed)
 Addended by: Malena Peer D on: 06/11/2023 12:07 PM   Modules accepted: Orders

## 2023-06-11 NOTE — Telephone Encounter (Signed)
 Spoke with patient. She tolerated the 97/103mg  well in the past. Picked up the [facility] yesterday. Confirmed that she did not pick up valsartan. Patient appreciative of the help.

## 2023-06-11 NOTE — Telephone Encounter (Signed)
 Called pt and Rx for Paula Stein has not been sent. Looks like she was previously rx [employer] , but not sure if she has been taking. May need to resume at lower dose.

## 2023-06-22 ENCOUNTER — Ambulatory Visit: Payer: Medicare HMO | Admitting: Physician Assistant

## 2023-06-27 DIAGNOSIS — N184 Chronic kidney disease, stage 4 (severe): Secondary | ICD-10-CM | POA: Diagnosis not present

## 2023-06-27 DIAGNOSIS — Z79899 Other long term (current) drug therapy: Secondary | ICD-10-CM | POA: Diagnosis not present

## 2023-06-27 DIAGNOSIS — L700 Acne vulgaris: Secondary | ICD-10-CM | POA: Diagnosis not present

## 2023-07-05 DIAGNOSIS — E1122 Type 2 diabetes mellitus with diabetic chronic kidney disease: Secondary | ICD-10-CM | POA: Diagnosis not present

## 2023-07-05 DIAGNOSIS — R809 Proteinuria, unspecified: Secondary | ICD-10-CM | POA: Diagnosis not present

## 2023-07-05 DIAGNOSIS — N184 Chronic kidney disease, stage 4 (severe): Secondary | ICD-10-CM | POA: Diagnosis not present

## 2023-07-05 DIAGNOSIS — I129 Hypertensive chronic kidney disease with stage 1 through stage 4 chronic kidney disease, or unspecified chronic kidney disease: Secondary | ICD-10-CM | POA: Diagnosis not present

## 2023-07-05 DIAGNOSIS — E559 Vitamin D deficiency, unspecified: Secondary | ICD-10-CM | POA: Diagnosis not present

## 2023-07-05 DIAGNOSIS — N2581 Secondary hyperparathyroidism of renal origin: Secondary | ICD-10-CM | POA: Diagnosis not present

## 2023-07-09 ENCOUNTER — Ambulatory Visit: Payer: Self-pay | Admitting: Podiatry

## 2023-07-16 ENCOUNTER — Ambulatory Visit (INDEPENDENT_AMBULATORY_CARE_PROVIDER_SITE_OTHER): Admitting: Podiatry

## 2023-07-16 ENCOUNTER — Encounter: Payer: Self-pay | Admitting: Podiatry

## 2023-07-16 VITALS — Ht 70.0 in | Wt 234.0 lb

## 2023-07-16 DIAGNOSIS — M1A0710 Idiopathic chronic gout, right ankle and foot, without tophus (tophi): Secondary | ICD-10-CM | POA: Diagnosis not present

## 2023-07-16 DIAGNOSIS — M1A0790 Idiopathic chronic gout, unspecified ankle and foot, without tophus (tophi): Secondary | ICD-10-CM

## 2023-07-16 MED ORDER — ALLOPURINOL 100 MG PO TABS: 100.0000 mg | ORAL_TABLET | Freq: Two times a day (BID) | ORAL | 3 refills | Status: DC

## 2023-07-16 MED ORDER — COLCHICINE 0.6 MG PO TABS: 0.6000 mg | ORAL_TABLET | Freq: Two times a day (BID) | ORAL | 0 refills | Status: DC

## 2023-07-16 NOTE — Progress Notes (Unsigned)
 Chief Complaint  Patient presents with   Gout    " I think I need to be checked out for gout and I have some tingling at night that wakes me up, I was on allipurinol in the past and it would help the pain"   HPI: 78 y.o. female presents today with concern of pain around the right great toe joint.  States that this has been bothering her for the past couple weeks.  It has been waking her up at night.  She notes that she gets some swelling to the area but denies any severe/excessive swelling.  She states the area will feel warm.  She had been taking allopurinol prescribed by Dr. Marylene Land in the past but states she ran out about 3 months ago.  She states that the allopurinol would help this joint pain every time she took it.  Says she does not recall taking colchicine in the past.    Past Medical History:  Diagnosis Date   AICD (automatic cardioverter/defibrillator) present 10/08/2014   SUBQ    /    DR Graciela Husbands   Cataract    CHF (congestive heart failure) (HCC)    Diabetes mellitus without complication (HCC)    GERD (gastroesophageal reflux disease)    History of cardiac cath 05/05/2007   normal-with patent coronaries   History of colonoscopy    History of hiatal hernia    Hyperlipidemia    Hypertension    Kidney failure    Stage 3   Non-ischemic cardiomyopathy (HCC)    EF 28%- reassessment of LV function 2011 with LVEf 45-50%   PAD (peripheral artery disease) (HCC)    lower extremities with ABIs of 0.5 bilaterally   Personal history of goiter    S/P radioactive iodine thyroid ablation    Sleep apnea    uses CPAP    Past Surgical History:  Procedure Laterality Date   BIOPSY  10/13/2020   Procedure: BIOPSY;  Surgeon: Shellia Cleverly, DO;  Location: [facility];  Service: Gastroenterology;;   BIOPSY  11/10/2021   Procedure: BIOPSY;  Surgeon: Shellia Cleverly, DO;  Location: [facility];  Service: Gastroenterology;;   CATARACT EXTRACTION Bilateral    COLONOSCOPY WITH  PROPOFOL N/A 10/13/2020   Procedure: COLONOSCOPY WITH PROPOFOL;  Surgeon: Shellia Cleverly, DO;  Location: [facility];  Service: Gastroenterology;  Laterality: N/A;   COLONOSCOPY WITH PROPOFOL N/A 11/10/2021   Procedure: COLONOSCOPY WITH PROPOFOL;  Surgeon: Shellia Cleverly, DO;  Location: [facility];  Service: Gastroenterology;  Laterality: N/A;   EP IMPLANTABLE DEVICE N/A 10/08/2014   Procedure: SubQ ICD Implant;  Surgeon: Duke Salvia, MD;  Location: [facility] INVASIVE CV LAB;  Service: Cardiovascular;  Laterality: N/A;   POLYPECTOMY  10/13/2020   Procedure: POLYPECTOMY;  Surgeon: Shellia Cleverly, DO;  Location: [facility];  Service: Gastroenterology;;   Leontine Locket ICD CHANGEOUT N/A 11/14/2019   Procedure: BJYN ICD CHANGEOUT;  Surgeon: Duke Salvia, MD;  Location: [facility] INVASIVE CV LAB;  Service: Cardiovascular;  Laterality: N/A;   THYROIDECTOMY     TONSILLECTOMY     TOTAL ABDOMINAL HYSTERECTOMY      Allergies  Allergen Reactions   Atorvastatin Other (See Comments)    Cramping   Farxiga [Dapagliflozin] Rash   Ibuprofen Nausea Only   Jardiance [Empagliflozin] Itching    Review of Systems  Musculoskeletal:  Positive for joint pain.     Physical Exam: Palpable pedal pulses noted.  There is moderate pain  on palpation to the right first MPJ.  There is pain with attempted range of motion of the joint.  No crepitus was noted.  Mild calor to the area and minimal edema noted.  No open lesions seen.  No significant deformity to the great toe joint.  No ecchymosis.  Epicritic sensation is intact  Assessment/Plan of Care: 1. Chronic gout of right foot, unspecified cause     Meds ordered this encounter  Medications   colchicine 0.6 MG tablet    Sig: Take 1 tablet (0.6 mg total) by mouth 2 (two) times daily for 20 days. Take one tablet by mouth twice daily until gout symptoms resolve.  Then stop taking the medication    Dispense:  40 tablet    Refill:  0   allopurinol (ZYLOPRIM) 100 MG  tablet    Sig: Take 1 tablet (100 mg total) by mouth 2 (two) times daily.    Dispense:  90 tablet    Refill:  3    This prescription was filled on 01/16/2022. Any refills authorized will be placed on file.   Discussed clinical findings with patient today.  Informed the patient that allopurinol typically is the daily, long-term gout prevention medication but colchicine is typically prescribed during acute gout attacks.  Will send in colchicine for her to take immediately 1 pill twice daily for up to 4 or 5 days until symptoms improve then she will stop taking the medication and keep it on hand if she has any future gout attacks.  Will also send in allopurinol 100 mg 1 tab p.o. twice daily to prevent future gout attacks.    She should be worked up by nephrology to assess kidney function and see why this has been such a recurrent issue.  She does note that these attacks happen every 1 to 2 months and the allopurinol has always been effective for her.  Follow-up as needed   Joe Murders, DPM, FACFAS [facility]     [address] [place], [place] [address]                Office [phone]  Fax [phone]

## 2023-07-27 ENCOUNTER — Ambulatory Visit (INDEPENDENT_AMBULATORY_CARE_PROVIDER_SITE_OTHER): Payer: Medicare HMO

## 2023-07-27 DIAGNOSIS — I428 Other cardiomyopathies: Secondary | ICD-10-CM

## 2023-07-27 LAB — CUP PACEART REMOTE DEVICE CHECK
Battery Remaining Percentage: 59 %
Date Time Interrogation Session: 20250425083500
HighPow Impedance: 75 Ohm
Implantable Lead Connection Status: 753985
Implantable Lead Implant Date: 20160707
Implantable Lead Location: 753862
Implantable Lead Model: 3401
Implantable Pulse Generator Implant Date: 20210813
Pulse Gen Serial Number: 143555

## 2023-07-31 ENCOUNTER — Ambulatory Visit: Attending: Internal Medicine | Admitting: Pharmacist

## 2023-07-31 VITALS — BP 150/86 | HR 65

## 2023-07-31 DIAGNOSIS — I10 Essential (primary) hypertension: Secondary | ICD-10-CM

## 2023-07-31 DIAGNOSIS — I5020 Unspecified systolic (congestive) heart failure: Secondary | ICD-10-CM

## 2023-07-31 NOTE — Assessment & Plan Note (Signed)
 Assessment: Patient is on appropriate GDMT Patient reports blood pressure at home well-controlled however elevated today in clinic She has not taken her a.m. medications yet, generally takes them around 10 AM and is about 9:50 AM Tries to be active Watches her salt intake Does miss her evening medications about once a week because she is out and then was unsure if she should take them when she gets home.  Encouraged her to make sure that she always takes her second dose of Entresto  and carvedilol   Plan: I have asked patient to check her blood pressure daily for about 2 weeks and call me with the readings Plan depending on her home readings

## 2023-07-31 NOTE — Patient Instructions (Signed)
 Please check your blood pressure daily for the next 2 weeks. Please call me at [phone] with the readings  Your blood pressure goal is < 130/78mmHg    Important lifestyle changes to control high blood pressure  Intervention  Effect on the BP   Weight loss Weight loss is one of the most effective lifestyle changes for controlling blood pressure. If you're overweight or obese, losing even a small amount of weight can help reduce blood pressure.    Blood pressure can decrease by 1 millimeter of mercury (mmHg) with each kilogram (about 2.2 pounds) of weight lost.   Exercise regularly As a general goal, aim for 30 minutes of moderate physical activity every day.    Regular physical activity can lower blood pressure by 5 - 8 mmHg.   Eat a healthy diet Eat a diet rich in whole grains, fruits, vegetables, lean meat, and low-fat dairy products. Limit processed foods, saturated fat, and sweets.    A heart-healthy diet can lower high blood pressure by 10 mmHg.   Reduce salt (sodium) in your diet Aim for 000mg  of sodium each day. Avoid deli meats, canned food, and frozen microwave meals which are high in sodium.     Limiting sodium can reduce blood pressure by 5 mmHg.   Limit alcohol One drink equals 12 ounces of beer, 5 ounces of wine, or 1.5 ounces of 80-proof liquor.    Limiting alcohol to < 1 drink a day for women or < 2 drinks a day for men can help lower blood pressure by about 4 mmHg.   To check your pressure at home you will need to:   Sit up in a chair, with feet flat on the floor and back supported. Do not cross your ankles or legs. Rest your left arm so that the cuff is about heart level. If the cuff goes on your upper arm, then just relax your arm on the table, arm of the chair, or your lap. If you have a wrist cuff, hold your wrist against your chest at heart level. Place the cuff snugly around your arm, about 1 inch above the crease of your elbow. The cords should  be inside the groove of your elbow.  Sit quietly, with the cuff in place, for about 5 minutes. Then press the power button to start a reading. Do not talk or move while the reading is taking place.  Record your readings on a sheet of paper. Although most cuffs have a memory, it is often easier to see a pattern developing when the numbers are all in front of you.  You can repeat the reading after 1-3 minutes if it is recommended.   Make sure your bladder is empty and you have not had caffeine or tobacco within the last 30 minutes   Always bring your blood pressure log with you to your appointments. If you have not brought your monitor in to be double checked for accuracy, please bring it to your next appointment.   You can find a list of validated (accurate) blood pressure cuffs at: validatebp.org

## 2023-07-31 NOTE — Progress Notes (Signed)
 Patient ID: Paula Stein                 DOB: 13-Oct-1945                      MRN: [id number]     HPI: Paula Stein is a 78 y.o. female patient of Dr. Katheryne Pane referred by Lovette Rud, PA to pharmacy clinic for medication management. PMH is significant for nonobstructive CAD on cardiac catheterization in 2009, nonischemic cardiomyopathy/chronic combined CHF with a EF of 35% on last echo 08/2020 status post ICD for primary prevention, portal vein thrombosis on Xarelto , hypertension, hyperlipidemia, CKD stage III, OSA on CPAP, GERD and DM. Most recent LVEF 30 on 35% om 07/07/21.    Seen by Lovette Rud 06/07/2023.  Patient expressed concerns about the cost of her Entresto  and Ozempic.  We were able to get patient health will grant and she was able to continue on Entresto  97/103.  Ozempic was discontinued due to cost concerns.  Patient reports today to clinic.  She reports that she has had no issues getting her Entresto .  Is back on Ozempic since the cost went back down.  Pretty sure her health will grant paid her deductible which is why her cost for Ozempic is now more reasonable.  She reports that she is compliant with her medications but about once a week may miss her evening meds.  Denies any signs and symptoms of low blood pressure.  Reports that her blood pressure at home is generally 120/80 or lower with rare higher readings.  However she does not have a log with her.  Reports checking her blood pressure once every few days.  She is active and uses her desk cycle for 30 minutes daily and also walks to the mailbox which is uphill daily.  Has 3 dogs that she is out in the yard with often as well.  She smokes about 1 pack of cigarettes every 2 weeks.  Generally only smokes if she is with her friends that are smoking.  Can go a week without smoking if no one else around her smoking.  Current CHF meds: Entresto  97/103 twice daily, spironolactone  50 mg daily, carvedilol  25 mg twice a day, furosemide  40 mg  twice a day, hydralazine  25 mg twice a day, isosorbide  mononitrate 30 mg daily Other hypertension medications: Amlodipine  10 mg daily  Previously tried:  Adherence Assessment  Do you ever forget to take your medication? Yes No  Do you ever skip doses due to side effects? Yes No  Do you have trouble affording your medicines? Yes No  Are you ever unable to pick up your medication due to transportation difficulties? Yes No  Do you ever stop taking your medications because you don't believe they are helping? Yes No  Do you check your weight daily? Yes No    BP goal: Less than 130/80  Family History:  Family History  Problem Relation Age of Onset   Diabetes type II Mother    Hypertension Mother    Diabetes Mother    Heart disease Mother    Other Father        deceased from accident age 73   Hyperlipidemia Father    Breast cancer Neg Hx    Stomach cancer Neg Hx    Colon cancer Neg Hx    Esophageal cancer Neg Hx    Pancreatic cancer Neg Hx      Social History: 1 pack every  2 weeks, doesn't smoke every day- bingo and casino, very seldom ETOH  Diet: cut back on salt, pork once a month Furits and veggies during summer  Exercise: desk cycle daily for 30 min- walk to mail box up [place] daily- 5 min, walks dog  Home BP readings: 120/80 sometimes lower  Wt Readings from Last 3 Encounters:  07/16/23 234 lb (106.1 kg)  06/07/23 234 lb 3.2 oz (106.2 kg)  02/05/23 232 lb 12.8 oz (105.6 kg)   BP Readings from Last 3 Encounters:  07/31/23 (!) 150/86  06/07/23 138/80  03/26/23 120/74   Pulse Readings from Last 3 Encounters:  07/31/23 65  06/07/23 63  02/05/23 72    Renal function: CrCl cannot be calculated (Patient's most recent lab result is older than the maximum 21 days allowed.).  Past Medical History:  Diagnosis Date   AICD (automatic cardioverter/defibrillator) present 10/08/2014   SUBQ    /    DR Rodolfo Clan   Cataract    CHF (congestive heart failure) (HCC)     Diabetes mellitus without complication (HCC)    GERD (gastroesophageal reflux disease)    History of cardiac cath 05/05/2007   normal-with patent coronaries   History of colonoscopy    History of hiatal hernia    Hyperlipidemia    Hypertension    Kidney failure    Stage 3   Non-ischemic cardiomyopathy (HCC)    EF 28%- reassessment of LV function 2011 with LVEf 45-50%   PAD (peripheral artery disease) (HCC)    lower extremities with ABIs of 0.5 bilaterally   Personal history of goiter    S/P radioactive iodine thyroid  ablation    Sleep apnea    uses CPAP    Current Outpatient Medications on File Prior to Visit  Medication Sig Dispense Refill   Accu-Chek Softclix Lancets lancets USE TO CHECK BLOOD SUGAR DAILY 100 each 1   allopurinol  (ZYLOPRIM ) 100 MG tablet Take 1 tablet (100 mg total) by mouth 2 (two) times daily. 90 tablet 3   amLODipine  (NORVASC ) 10 MG tablet Take 10 mg by mouth daily.     Blood Glucose Monitoring Suppl (ACCU-CHEK GUIDE) w/Device KIT 1 each by Does not apply route daily. Use to check blood sugar daily 1 kit 0   Calcium  Carb-Cholecalciferol (CALCIUM  600+D3 PO) Take 1 tablet by mouth daily.     carvedilol  (COREG ) 25 MG tablet Take 1 tablet (25 mg total) by mouth 2 (two) times daily with a meal. 180 tablet 3   colchicine  0.6 MG tablet Take 1 tablet (0.6 mg total) by mouth 2 (two) times daily for 20 days. Take one tablet by mouth twice daily until gout symptoms resolve.  Then stop taking the medication 40 tablet 0   furosemide  (LASIX ) 40 MG tablet Take 1 tablet (40 mg total) by mouth 2 (two) times daily. 180 tablet 3   glipiZIDE  (GLUCOTROL  XL) 2.5 MG 24 hr tablet TAKE 1 TABLET(2.5 MG) BY MOUTH DAILY WITH BREAKFAST 90 tablet 0   hydrALAZINE  (APRESOLINE ) 25 MG tablet Take 25 mg by mouth 2 (two) times daily.     isosorbide  mononitrate (IMDUR ) 30 MG 24 hr tablet Take 1 tablet (30 mg total) by mouth daily. 90 tablet 3   levothyroxine  (SYNTHROID ) 125 MCG tablet TAKE 1 TABLET  BY MOUTH EVERY MORNING ON EMPTY STOMACH 30 MINUTES BEFORE OTHER MEDICATIONS OR BEFORE BREAKFAST 90 tablet 1   sacubitril -valsartan  (ENTRESTO ) 97-103 MG Take 1 tablet by mouth 2 (two) times daily.  Semaglutide,0.25 or 0.5MG /DOS, (OZEMPIC, 0.25 OR 0.5 MG/DOSE,) 2 MG/3ML SOPN Inject 0.5 mg into the skin once a week.     spironolactone  (ALDACTONE ) 50 MG tablet Take 1 tablet (50 mg total) by mouth daily. 90 tablet 3   traZODone  (DESYREL ) 50 MG tablet Take 0.5-1 tablets (25-50 mg total) by mouth at bedtime as needed for sleep. 30 tablet 1   glucose blood (ACCU-CHEK GUIDE) test strip Use as instructed 100 strip 2   No current facility-administered medications on file prior to visit.    Allergies  Allergen Reactions   Atorvastatin  Other (See Comments)    Cramping   Farxiga  [Dapagliflozin ] Rash   Ibuprofen Nausea Only   Jardiance  [Empagliflozin ] Itching     Assessment/Plan:  1. CHF -  HFrEF (heart failure with reduced ejection fraction) (HCC) Assessment: Patient is on appropriate GDMT Patient reports blood pressure at home well-controlled however elevated today in clinic She has not taken her a.m. medications yet, generally takes them around 10 AM and is about 9:50 AM Tries to be active Watches her salt intake Does miss her evening medications about once a week because she is out and then was unsure if she should take them when she gets home.  Encouraged her to make sure that she always takes her second dose of Entresto  and carvedilol   Plan: I have asked patient to check her blood pressure daily for about 2 weeks and call me with the readings Plan depending on her home readings  Essential hypertension Assessment: BP elevated in clinic as above but appears well-controlled at home  Plan: Patient to check blood pressure at home for 2 weeks and send me readings I encouraged her to try not to miss any doses of her blood pressure medications   Thank you   Abriana Saltos D Daryle Amis, Pharm.D,  BCACP, CPP [facility] of [facility] [address] [place], [place] [address]  Phone: [phone]; Fax: [phone]

## 2023-07-31 NOTE — Assessment & Plan Note (Signed)
 Assessment: BP elevated in clinic as above but appears well-controlled at home  Plan: Patient to check blood pressure at home for 2 weeks and send me readings I encouraged her to try not to miss any doses of her blood pressure medications

## 2023-08-16 ENCOUNTER — Telehealth: Payer: Self-pay | Admitting: Family Medicine

## 2023-08-16 NOTE — Telephone Encounter (Signed)
 Copied from CRM [id number]. Topic: General - Other >> Aug 16, 2023 10:01 AM Allyne Areola wrote: Reason for CRM: [employer] is calling to verify if a order request for patient diabetic shoes was received by the office. They will be re-faxing it today. Best call back number is [phone].

## 2023-08-16 NOTE — Telephone Encounter (Signed)
 Patient is coming in for an appt.

## 2023-08-20 ENCOUNTER — Encounter: Payer: Self-pay | Admitting: Family Medicine

## 2023-08-20 ENCOUNTER — Ambulatory Visit (INDEPENDENT_AMBULATORY_CARE_PROVIDER_SITE_OTHER): Admitting: Family Medicine

## 2023-08-20 VITALS — BP 142/76 | HR 81 | Temp 95.0°F | Ht 70.0 in | Wt 229.2 lb

## 2023-08-20 DIAGNOSIS — Z7985 Long-term (current) use of injectable non-insulin antidiabetic drugs: Secondary | ICD-10-CM

## 2023-08-20 DIAGNOSIS — I5020 Unspecified systolic (congestive) heart failure: Secondary | ICD-10-CM | POA: Diagnosis not present

## 2023-08-20 DIAGNOSIS — E782 Mixed hyperlipidemia: Secondary | ICD-10-CM | POA: Diagnosis not present

## 2023-08-20 DIAGNOSIS — I10 Essential (primary) hypertension: Secondary | ICD-10-CM

## 2023-08-20 DIAGNOSIS — N184 Chronic kidney disease, stage 4 (severe): Secondary | ICD-10-CM

## 2023-08-20 DIAGNOSIS — E1142 Type 2 diabetes mellitus with diabetic polyneuropathy: Secondary | ICD-10-CM | POA: Diagnosis not present

## 2023-08-20 DIAGNOSIS — E890 Postprocedural hypothyroidism: Secondary | ICD-10-CM

## 2023-08-20 DIAGNOSIS — Z720 Tobacco use: Secondary | ICD-10-CM | POA: Diagnosis not present

## 2023-08-20 LAB — POCT GLYCOSYLATED HEMOGLOBIN (HGB A1C): Hemoglobin A1C: 5.9 % — AB (ref 4.0–5.6)

## 2023-08-20 NOTE — Progress Notes (Signed)
 Established Patient Office Visit   Subjective  Patient ID: Paula Stein, female    DOB: December 25, 1945  Age: 78 y.o. MRN: [id number]  Chief Complaint  Patient presents with   Medical Management of Chronic Issues    Diabetic shoe form     Patient is an 78 year old female seen for follow-up.  Patient states she is doing well overall.  Seen by nephrology recently.  Had some labs done there.  Patient started on Ozempic by nephrology.  States it takes her appetite and she has to force herself to eat.  Patient states that she wears tennis shoes that caused her feet to burn unless gets extrawide size.  Even then can only wear for 1 hour.  Form sent to this provider regarding diabetic shoes.  Hemoglobin A1c was 6.9% on 02/05/2023.  Patient cut down on tobacco use.  Currently smoking 1 pack/month.  Working on quitting.    Patient Active Problem List   Diagnosis Date Noted   History of colonic polyps    Cecal polyp    Kidney disease, chronic, stage IV (GFR 15-29 ml/min) (HCC) 07/07/2021   DM2 (diabetes mellitus, type 2) (HCC) 07/07/2021   Acute on chronic systolic (congestive) heart failure (HCC) 07/06/2021   Diverticulosis of colon without hemorrhage    Multiple polyps of sigmoid colon    Polyp of ascending colon    Polyp of descending colon    Rectal polyp    Lower abdominal pain    Coagulopathy (HCC) 04/13/2020   Sigmoid diverticulitis 03/15/2020   Portal vein thrombosis 03/14/2020   HFrEF (heart failure with reduced ejection fraction) (HCC) 03/13/2018   Non-ischemic cardiomyopathy (HCC)    Hypertension    Diabetes mellitus without complication (HCC)    CAD (coronary artery disease) 06/18/2017   PAD (peripheral artery disease) (HCC) 06/18/2017   ICD (implantable cardioverter-defibrillator) in place 06/18/2017   Chronic combined systolic and diastolic CHF (congestive heart failure) (HCC) 05/22/2017   Hyperlipidemia associated with type 2 diabetes mellitus (HCC) 12/11/2016    Treatment-emergent central sleep apnea 09/20/2016   NICM (nonischemic cardiomyopathy) (HCC) 10/08/2014   AICD (automatic cardioverter/defibrillator) present 10/08/2014   Cigarette smoker 10/30/2012   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   De Quervain's disease (tenosynovitis) 09/01/2010   Atherosclerosis 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 (HCC) 12/07/2008   Diabetes (HCC) 10/29/2008   Hypothyroidism 01/31/2007   Essential hypertension 01/31/2007   GERD 01/31/2007   Past Medical History:  Diagnosis Date   AICD (automatic cardioverter/defibrillator) present 10/08/2014   SUBQ    /    DR Rodolfo Clan   Cataract    CHF (congestive heart failure) (HCC)    Diabetes mellitus without complication (HCC)    GERD (gastroesophageal reflux disease)    History of cardiac cath 05/05/2007   normal-with patent coronaries   History of colonoscopy    History of hiatal hernia    Hyperlipidemia    Hypertension    Kidney failure    Stage 3   Non-ischemic cardiomyopathy (HCC)    EF 28%- reassessment of LV function 2011 with LVEf 45-50%   PAD (peripheral artery disease) (HCC)    lower extremities with ABIs of 0.5 bilaterally   Personal history of goiter    S/P radioactive iodine thyroid  ablation    Sleep apnea    uses CPAP   Past Surgical History:  Procedure Laterality Date   BIOPSY  10/13/2020   Procedure: BIOPSY;  Surgeon: Annis Kinder, DO;  Location: [facility]  ENDOSCOPY;  Service: Gastroenterology;;   BIOPSY  11/10/2021   Procedure: BIOPSY;  Surgeon: Annis Kinder, DO;  Location: [facility] ENDOSCOPY;  Service: Gastroenterology;;   CATARACT EXTRACTION Bilateral    COLONOSCOPY WITH PROPOFOL  N/A 10/13/2020   Procedure: COLONOSCOPY WITH PROPOFOL ;  Surgeon: Annis Kinder, DO;  Location: [facility] ENDOSCOPY;  Service: Gastroenterology;  Laterality: N/A;   COLONOSCOPY WITH PROPOFOL  N/A 11/10/2021   Procedure: COLONOSCOPY WITH PROPOFOL ;  Surgeon: Annis Kinder, DO;  Location: [facility]  ENDOSCOPY;  Service: Gastroenterology;  Laterality: N/A;   EP IMPLANTABLE DEVICE N/A 10/08/2014   Procedure: SubQ ICD Implant;  Surgeon: Verona Goodwill, MD;  Location: [facility] INVASIVE CV LAB;  Service: Cardiovascular;  Laterality: N/A;   POLYPECTOMY  10/13/2020   Procedure: POLYPECTOMY;  Surgeon: Annis Kinder, DO;  Location: [facility] ENDOSCOPY;  Service: Gastroenterology;;   Leida Puna ICD CHANGEOUT N/A 11/14/2019   Procedure: ZOXW ICD CHANGEOUT;  Surgeon: Verona Goodwill, MD;  Location: [facility] INVASIVE CV LAB;  Service: Cardiovascular;  Laterality: N/A;   THYROIDECTOMY     TONSILLECTOMY     TOTAL ABDOMINAL HYSTERECTOMY     Social History   Tobacco Use   Smoking status: Light Smoker    Types: Cigarettes   Smokeless tobacco: Never   Tobacco comments:    Smokes a pack per week.  05/24/2022 hfb  Vaping Use   Vaping status: Never Used  Substance Use Topics   Alcohol use: Not Currently    Comment: rare glass of wine   Drug use: No   Family History  Problem Relation Age of Onset   Diabetes type II Mother    Hypertension Mother    Diabetes Mother    Heart disease Mother    Other Father        deceased from accident age 85   Hyperlipidemia Father    Breast cancer Neg Hx    Stomach cancer Neg Hx    Colon cancer Neg Hx    Esophageal cancer Neg Hx    Pancreatic cancer Neg Hx    Allergies  Allergen Reactions   Atorvastatin  Other (See Comments)    Cramping   Farxiga  [Dapagliflozin ] Rash   Ibuprofen Nausea Only   Jardiance  [Empagliflozin ] Itching    ROS Negative unless stated above    Objective:      BP (!) 142/76 (BP Location: Right Arm, Patient Position: Sitting, Cuff Size: Normal)   Pulse 81   Temp (!) 95 F (35 C) (Oral)   Ht 5\' 10"  (1.778 m)   Wt 229 lb 3.2 oz (104 kg)   SpO2 95%   BMI 32.89 kg/m  BP Readings from Last 3 Encounters:  08/20/23 (!) 142/76  07/31/23 (!) 150/86  06/07/23 138/80   Wt Readings from Last 3 Encounters:  08/20/23 229 lb 3.2 oz (104 kg)   07/16/23 234 lb (106.1 kg)  06/07/23 234 lb 3.2 oz (106.2 kg)      Physical Exam Constitutional:      General: She is not in acute distress.    Appearance: Normal appearance.  HENT:     Head: Normocephalic and atraumatic.     Nose: Nose normal.     Mouth/Throat:     Mouth: Mucous membranes are moist.  Cardiovascular:     Rate and Rhythm: Normal rate and regular rhythm.     Heart sounds: Normal heart sounds. No murmur heard.    No gallop.  Pulmonary:     Effort: Pulmonary effort is normal. No  respiratory distress.     Breath sounds: Normal breath sounds. No wheezing, rhonchi or rales.  Skin:    General: Skin is warm and dry.  Neurological:     Mental Status: She is alert and oriented to person, place, and time.       02/12/2023    9:35 AM 04/21/2022    9:41 AM 12/21/2021   10:15 AM  Depression screen PHQ 2/9  Decreased Interest 0 0 0  Down, Depressed, Hopeless 0 0 0  PHQ - 2 Score 0 0 0  Altered sleeping 0 0 1  Tired, decreased energy 2 0 1  Change in appetite 1 0 0  Feeling bad or failure about yourself  0 0 0  Trouble concentrating 0 0 0  Moving slowly or fidgety/restless 0 0 0  Suicidal thoughts 0 0 0  PHQ-9 Score 3 0 2  Difficult doing work/chores Not difficult at all  Not difficult at all      08/21/2016   10:07 AM  GAD 7 : Generalized Anxiety Score  Nervous, Anxious, on Edge 0  Control/stop worrying 0  Worry too much - different things 0  Trouble relaxing 2  Restless 0  Easily annoyed or irritable 0  Afraid - awful might happen 0  Total GAD 7 Score 2   Diabetic Foot Exam - Simple   Simple Foot Form Visual Inspection No deformities, no ulcerations, no other skin breakdown bilaterally: Yes See comments: Yes Sensation Testing Intact to touch and monofilament testing bilaterally: Yes Pulse Check Posterior Tibialis and Dorsalis pulse intact bilaterally: Yes Comments Callous forming on dorsum of L 3rd toe.    Results for orders placed or performed  in visit on 08/20/23  POC HgB A1c  Result Value Ref Range   Hemoglobin A1C 5.9 (A) 4.0 - 5.6 %   HbA1c POC (<> result, manual entry)     HbA1c, POC (prediabetic range)     HbA1c, POC (controlled diabetic range)        Assessment & Plan:   Type 2 diabetes mellitus with diabetic polyneuropathy, without long-term current use of insulin  (HCC) -     POCT glycosylated hemoglobin (Hb A1C) -     Microalbumin / creatinine urine ratio  HFrEF (heart failure with reduced ejection fraction) (HCC)  Chronic kidney disease, stage 4 (severe) (HCC)  Tobacco use  Essential hypertension  Mixed hyperlipidemia  Postablative hypothyroidism  Hemoglobin A1c 6.9% on 02/05/2023.  Hemoglobin A1c 5.9% this visit.  Continue semaglutide 0.5 mg weekly.  Continue lifestyle modifications.  Okay to discontinue/hold glipizide  XL 2.5 mg daily.  Decreased sensation to vibratory and monofilament in bilateral great toes s/p history of bunionectomy.  Will complete form for diabetic shoes.  BP controlled at home.  Recheck.  Euvolemic on exam.  Continue Entresto  97-103 mg twice daily, Coreg  25 mg twice daily, hydralazine  25 mg twice daily, spironolactone  50 mg daily, Lasix  40 mg twice daily, Imdur  30 mg daily, Norvasc  10 mg daily.  Continue lifestyle modifications.   Continue regular follow-up with nephrology.  Intolerance to Jardiance  and Farxiga .  Last GFR 28 on 03/09/2023 and 34 on 06/27/2023.  Continue vitamin D  supplement for secondary hyperparathyroidism.   Patient congratulated on decreasing nicotine  use.  Now smoking 1 pack/month.  Smoking cessation encouraged.  Continue Synthroid  125 mcg daily.  Return in about 6 months (around 02/20/2024).   Viola Greulich, MD

## 2023-09-03 NOTE — Progress Notes (Signed)
 Remote ICD transmission.

## 2023-09-13 ENCOUNTER — Other Ambulatory Visit: Payer: Self-pay | Admitting: Internal Medicine

## 2023-09-17 ENCOUNTER — Other Ambulatory Visit: Payer: Self-pay | Admitting: Family Medicine

## 2023-09-17 DIAGNOSIS — I10 Essential (primary) hypertension: Secondary | ICD-10-CM

## 2023-09-20 ENCOUNTER — Other Ambulatory Visit: Payer: Self-pay | Admitting: Family Medicine

## 2023-09-24 ENCOUNTER — Other Ambulatory Visit (INDEPENDENT_AMBULATORY_CARE_PROVIDER_SITE_OTHER): Payer: Medicare HMO

## 2023-09-24 VITALS — BP 132/80

## 2023-09-24 DIAGNOSIS — E1142 Type 2 diabetes mellitus with diabetic polyneuropathy: Secondary | ICD-10-CM

## 2023-09-24 DIAGNOSIS — I10 Essential (primary) hypertension: Secondary | ICD-10-CM

## 2023-09-24 DIAGNOSIS — Z720 Tobacco use: Secondary | ICD-10-CM

## 2023-09-24 NOTE — Progress Notes (Signed)
 09/24/2023 Name: Paula Stein MRN: [id number] DOB: 1945-11-26  Chief Complaint  Patient presents with   Medication Management   Hypertension   Diabetes   Nicotine  Dependence    Paula Stein is a 78 y.o. year old female who presented for a telephone visit.   They were referred to the pharmacist by their PCP for assistance in managing hypertension, hyperlipidemia, and complex medication management.    Subjective:  Care Team: Primary Care Provider: Mercer Clotilda JONELLE, MD   Medication Access/Adherence  Current Pharmacy:  [facility] [phone] - [place], [place] - [address] [place] [address] Phone: [phone] Fax: [phone]   Patient reports affordability concerns with their medications: No  Patient reports access/transportation concerns to their pharmacy: No  Patient reports adherence concerns with their medications:  No     Hypertension:  Current medications:  Carvedilol  25mg  BID, Lasix  40mg  BID, Entresto  97-103 BID, Spironolactone  50mg   Medications previously tried: Hydralazine , hydrochlorothiazide, Losartan , Valsartan , Amlodipine   Patient has a validated, automated, upper arm home BP cuff Current blood pressure readings readings: 132/80 today, reports most readings stay under 130/80 and that was a little high for her  Patient denies hypotensive s/sx including dizziness, lightheadedness.  Patient denies hypertensive symptoms including headache, chest pain, shortness of breath  Current meal patterns: mindful of salt intake  Current physical activity: goes to gym occassionaly  Diabetes:  Current medications: Ozempic 0.5mg  once weekly, Glipizide  2.5mg  daily Medications tried in the past: Jardiance /Farxiga , Metformin   Current glucose readings: Denies any low BG since most recent PCP visit  Does not that she has been unintentionally losing weight and she thinks this is due to skipping  breakfast since being on Ozempic 0.5mg  dose  Observed patterns:  Patient denies hypoglycemic s/sx including dizziness, shakiness, sweating. Patient denies hyperglycemic symptoms including polyuria, polydipsia, polyphagia, nocturia, neuropathy, blurred vision.  Tobacco Abuse: -Smoked for many years, states she quit at one point with chantix and a support group but as soon as she finished that rx and stopped going to group, she picked it back up -Current smokes about 1 pack every 2-3 weeks but wants to be off completely    Objective:  Lab Results  Component Value Date   HGBA1C 5.9 (A) 08/20/2023    Lab Results  Component Value Date   CREATININE 1.71 (H) 02/05/2023   BUN 19 02/05/2023   NA 139 02/05/2023   K 3.9 02/05/2023   CL 105 02/05/2023   CO2 28 02/05/2023    Lab Results  Component Value Date   CHOL 236 (H) 12/21/2021   HDL 49.40 12/21/2021   LDLCALC 161 (H) 12/21/2021   LDLDIRECT 195.2 02/15/2011   TRIG 128.0 12/21/2021   CHOLHDL 5 12/21/2021    Medications Reviewed Today     Reviewed by Lionell Jon DEL, RPH (Pharmacist) on 09/24/23 at 0915  Med List Status: <None>   Medication Order Taking? Sig Documenting Provider Last Dose Status Informant  Accu-Chek Softclix Lancets lancets [id number]  USE TO CHECK BLOOD SUGAR DAILY Mercer Clotilda JONELLE, MD  Active Self  allopurinol  (ZYLOPRIM ) 100 MG tablet [id number] Yes Take 1 tablet (100 mg total) by mouth 2 (two) times daily. McCaughan, Dia D, DPM  Active   amLODipine  (NORVASC ) 10 MG tablet [id number]  Take 10 mg by mouth daily. [provider]  Active   Blood Glucose Monitoring Suppl (ACCU-CHEK GUIDE) w/Device KIT [id number]  1 each by  Does not apply route daily. Use to check blood sugar daily Mercer Clotilda SAUNDERS, MD  Active   Calcium  Carb-Cholecalciferol (CALCIUM  600+D3 PO) [id number] Yes Take 1 tablet by mouth daily. [provider]  Active Self  carvedilol  (COREG ) 25 MG tablet [id number] Yes Take 1 tablet (25 mg  total) by mouth 2 (two) times daily with a meal. Mercer Clotilda SAUNDERS, MD  Active   colchicine  0.6 MG tablet [id number]  Take 1 tablet (0.6 mg total) by mouth 2 (two) times daily for 20 days. Take one tablet by mouth twice daily until gout symptoms resolve.  Then stop taking the medication McCaughan, Dia D, DPM  Expired 08/05/23 2359   furosemide  (LASIX ) 40 MG tablet [id number] Yes TAKE 1 TABLET(40 MG) BY MOUTH TWICE DAILY Mercer Clotilda SAUNDERS, MD  Active   glipiZIDE  (GLUCOTROL  XL) 2.5 MG 24 hr tablet [id number] Yes TAKE 1 TABLET(2.5 MG) BY MOUTH DAILY WITH BREAKFAST Mercer Clotilda SAUNDERS, MD  Active   glucose blood (ACCU-CHEK GUIDE) test strip [id number]  Use as instructed Mercer Clotilda SAUNDERS, MD  Active   hydrALAZINE  (APRESOLINE ) 25 MG tablet [id number] Yes Take 25 mg by mouth 2 (two) times daily. [provider]  Active   isosorbide  mononitrate (IMDUR ) 30 MG 24 hr tablet [id number] Yes Take 1 tablet (30 mg total) by mouth daily. Mercer Clotilda SAUNDERS, MD  Active   levothyroxine  (SYNTHROID ) 125 MCG tablet [id number] Yes TAKE 1 TABLET BY MOUTH EVERY MORNING ON EMPTY STOMACH 30 MINUTES BEFORE OTHER MEDICATIONS OR BEFORE BREAKFAST Mercer Clotilda SAUNDERS, MD  Active   sacubitril -valsartan  (ENTRESTO ) 97-103 MG [id number] Yes Take 1 tablet by mouth 2 (two) times daily. Lucien Orren SAILOR, PA-C  Active   Semaglutide,0.25 or 0.5MG /DOS, (OZEMPIC, 0.25 OR 0.5 MG/DOSE,) 2 MG/3ML SOPN [id number] Yes Inject 0.5 mg into the skin once a week. [provider]  Active   spironolactone  (ALDACTONE ) 50 MG tablet [id number] Yes Take 1 tablet (50 mg total) by mouth daily. Mercer Clotilda SAUNDERS, MD  Active   traZODone  (DESYREL ) 50 MG tablet [id number] Yes Take 0.5-1 tablets (25-50 mg total) by mouth at bedtime as needed for sleep. Mercer Clotilda SAUNDERS, MD  Active               Assessment/Plan:   Hypertension: - Currently controlled - Reviewed long term cardiovascular and renal outcomes of uncontrolled blood pressure - Reviewed appropriate  blood pressure monitoring technique and reviewed goal blood pressure. Recommended to check home blood pressure and heart rate daily -Recommend to continue current medication therapy  Diabetes: - Currently controlled - Reviewed long term cardiovascular and renal outcomes of uncontrolled blood sugar - Reviewed goal A1c, goal fasting, and goal 2 hour post prandial glucose - Reviewed dietary modifications including adding a glucerna or other diabetic friendly shake in the morning to help with weight - Recommend to continue current medication therapy, if A1C still well controlled at next PCP visit or if patient begins to experience sugar lows, can consider stopping the glipizide   - Patient denies personal or family history of multiple endocrine neoplasia type 2, medullary thyroid  cancer; personal history of pancreatitis or gallbladder disease. - Recommend to check glucose daily    Tobacco Abuse - Currently uncontrolled - Provided motivational interviewing to assess tobacco use and strategies for reduction - Provided information on 1 800 QUIT NOW support program - Recommend gum or lozenge for breakthrough cravings, patient will discuss with quit now support program   Follow Up Plan: 1 month  Jon DEL  Lionell, PharmD Clinical Pharmacist [phone]

## 2023-10-08 DIAGNOSIS — E119 Type 2 diabetes mellitus without complications: Secondary | ICD-10-CM | POA: Diagnosis not present

## 2023-10-08 DIAGNOSIS — I10 Essential (primary) hypertension: Secondary | ICD-10-CM | POA: Diagnosis not present

## 2023-10-09 LAB — MICROALBUMIN / CREATININE URINE RATIO
Albumin, Urine POC: 67.2
Creatinine, POC: 33 mg/dL
EGFR: 31
Microalb Creat Ratio: 2036

## 2023-10-15 ENCOUNTER — Telehealth: Payer: Self-pay

## 2023-10-15 NOTE — Telephone Encounter (Signed)
 Called patient left a VM to return call, patient should take BP meds and retake BP

## 2023-10-15 NOTE — Telephone Encounter (Signed)
 Copied from CRM [id number]. Topic: Clinical - Medical Advice >> Oct 15, 2023 11:08 AM Lavanda D wrote: Reason for CRM: Patient's son called because his mothers blood pressure was 170/105 at her dentist appointment. Patient has not been taking her medication, none this morning/afternoon - Son is wondering if she should just go home and take it and see if that improves, or if she should go to urgent care.

## 2023-10-17 NOTE — Telephone Encounter (Signed)
 Called and spoke with patient, patient BP has went down 112/72 today.

## 2023-10-20 ENCOUNTER — Encounter: Payer: Self-pay | Admitting: Internal Medicine

## 2023-10-22 ENCOUNTER — Other Ambulatory Visit: Payer: Self-pay | Admitting: Family Medicine

## 2023-10-24 ENCOUNTER — Other Ambulatory Visit

## 2023-10-26 ENCOUNTER — Ambulatory Visit: Payer: Medicare HMO

## 2023-10-26 DIAGNOSIS — I428 Other cardiomyopathies: Secondary | ICD-10-CM | POA: Diagnosis not present

## 2023-10-30 LAB — CUP PACEART REMOTE DEVICE CHECK
Battery Remaining Percentage: 56 %
Date Time Interrogation Session: 20250725074500
HighPow Impedance: 60 Ohm
Implantable Lead Connection Status: 753985
Implantable Lead Implant Date: 20160707
Implantable Lead Location: 753862
Implantable Lead Model: 3401
Implantable Pulse Generator Implant Date: 20210813
Pulse Gen Serial Number: 143555

## 2023-11-01 ENCOUNTER — Ambulatory Visit: Payer: Self-pay | Admitting: Cardiology

## 2023-11-07 ENCOUNTER — Telehealth: Payer: Self-pay

## 2023-11-07 NOTE — Telephone Encounter (Signed)
 Copied from CRM #[id number]. Topic: Clinical - Order For Equipment >> Nov 07, 2023  8:50 AM Isabell A wrote: Reason for CRM: Patient is requesting new CPAP supplies through [employer] [facility].   Callback number: [phone]  Pt is going to need an ov before we can send in supplies. Pt was last seen on 05-24-22 and does not have any upcoming appointments scheduled with our office. I called and spoke to pt. I informed pt of this and pt verbalized understanding. I got pt scheduled for a virtual visit with Almarie Ferrari, NP on 11-20-23. NFN

## 2023-11-19 NOTE — Progress Notes (Unsigned)
 Electrophysiology Office Note:   Date:  11/21/2023  ID:  Paula Stein, DOB 04/15/1945, MRN [id number]  Primary Cardiologist: Ozell Fell, MD Electrophysiologist: Fonda Kitty, MD      History of Present Illness:   Paula Stein is a 78 y.o. 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  Discussed the use of AI scribe software for clinical note transcription with the patient, who gave verbal consent to proceed.  History of Present Illness Paula Stein is an 78 year old female who presents for a cardiac device check.  She is here for a routine check of her cardiac device. She experiences itching at the site of the device, which she attributes to her bra strap rubbing against it.  She takes multiple medications, including thyroid  medication first thing in the morning, followed by seven other pills throughout the day. She takes her first batch of pills around 9 AM and the last set around 5:30 PM to avoid nocturia, which occurs if she takes her diuretic, Lasix , too late in the evening.  Over the past four weeks, she has noticed increased shortness of breath, particularly when walking to her mailbox, requiring her to rest upon arrival. She associates this symptom with fluid retention, as she has experienced similar symptoms in the past. She reports a weight increase from 220 pounds to 240 pounds over the last three months, although her home scale is currently broken. She attributes some of the weight gain to starting Ozempic, prescribed by her kidney doctor. She is currently taking Lasix , one pill in the morning and one at night.  She stays active and socially engaged, frequently traveling to visit friends and  family. She drives to [employer] and [place], [place] , to maintain connections with her social circle. She believes staying active is beneficial for her well-being.   Review of systems complete and found to be negative unless listed in HPI.   EP Information / Studies Reviewed:    EKG is ordered today. Personal review as below.  EKG Interpretation Date/Time:  Tuesday November 20 2023 11:20:15 EDT Ventricular Rate:  74 PR Interval:  252 QRS Duration:  98 QT Interval:  380 QTC Calculation: 421 R Axis:   82  Text Interpretation: Sinus rhythm with 1st degree A-V block Nonspecific T wave abnormality When compared with ECG of 07-Jun-2023 13:39, Questionable change in QRS axis Confirmed by Kitty Fonda [phone]) on 11/20/2023 12:35:06 PM   Echo 07/07/21:   1. Left ventricular ejection fraction, by estimation, is 30 to 35%. The  left ventricle has moderately decreased function. The left ventricle  demonstrates global hypokinesis. There is mild left ventricular  hypertrophy. Left ventricular diastolic  parameters are consistent with Grade I diastolic dysfunction (impaired  relaxation). Elevated left ventricular end-diastolic pressure. The E/e' is  15.   2. Right ventricular systolic function is moderately reduced. The right  ventricular size is normal.   3. Left atrial size was mildly dilated.   4. The pericardial effusion is posterior to the left ventricle.   5. The mitral valve is abnormal. Trivial mitral valve regurgitation.   6. The aortic valve is tricuspid. Aortic valve regurgitation is not  visualized.    Physical Exam:   VS:  BP 127/73  Pulse 74   Ht 5' 10 (1.778 m)   Wt 240 lb 4.8 oz (109 kg)   SpO2 96%   BMI 34.48 kg/m    Wt Readings from Last 3 Encounters:  11/20/23 240 lb 4.8 oz (109 kg)  08/20/23 229 lb 3.2 oz (104 kg)  07/16/23 234 lb (106.1 kg)     GEN: Well nourished, well developed in no acute distress NECK: No JVD CARDIAC: Normal rate, regular rhythm.   Well-healed left mid axillary SICD pocket. RESPIRATORY:  Clear to auscultation without rales, wheezing or rhonchi  ABDOMEN: Soft, non-distended EXTREMITIES:  No edema; No deformity   ASSESSMENT AND PLAN:    Status post S-ICD: S/p GEN change secondary to device failure. In clinic device interrogation was performed today.  Appropriate device function and stable lead parameters -sensing and impedance.  Battery status okay.  No arrhythmias or device therapies.  No programming changes made. Continue remote monitoring.  Chronic systolic heart failure secondary to nonischemic cardiomyopathy: No obvious volume overload on exam, but difficult to assess due to body habitus.  She does endorse weight gain and dyspnea on exertion. Continue GDMT regimen of carvedilol  25 mg twice daily, hydralazine  25 mg twice daily, Imdur  30 mg daily, Entresto  97-103 mg twice daily, spironolactone  50 mg daily.   Given her weight gain and associated dyspnea on exertion, I encouraged her to take 80 mg of Lasix  in the morning (instead of 40 mg) and continue 40 mg in the evening for 3 days.  We will arrange for her to have follow-up with general cardiology for further management.    Hypertension At goal today.  Recommend checking blood pressures 1-2 times per week at home and recording the values.  Recommend bringing these recordings to the primary care physician.   Portal Vein thrombosis On Xarelto  chronically  Follow up with Dr. Kennyth in 12 months  Signed, Fonda Kennyth, MD

## 2023-11-20 ENCOUNTER — Encounter: Payer: Self-pay | Admitting: Cardiology

## 2023-11-20 ENCOUNTER — Ambulatory Visit: Attending: Cardiology | Admitting: Cardiology

## 2023-11-20 ENCOUNTER — Encounter: Payer: Self-pay | Admitting: Primary Care

## 2023-11-20 ENCOUNTER — Ambulatory Visit (INDEPENDENT_AMBULATORY_CARE_PROVIDER_SITE_OTHER): Admitting: Primary Care

## 2023-11-20 VITALS — BP 127/73 | HR 74 | Ht 70.0 in | Wt 240.3 lb

## 2023-11-20 DIAGNOSIS — I428 Other cardiomyopathies: Secondary | ICD-10-CM

## 2023-11-20 DIAGNOSIS — F17210 Nicotine dependence, cigarettes, uncomplicated: Secondary | ICD-10-CM

## 2023-11-20 DIAGNOSIS — G4739 Other sleep apnea: Secondary | ICD-10-CM | POA: Diagnosis not present

## 2023-11-20 DIAGNOSIS — Z95810 Presence of automatic (implantable) cardiac defibrillator: Secondary | ICD-10-CM

## 2023-11-20 DIAGNOSIS — I5042 Chronic combined systolic (congestive) and diastolic (congestive) heart failure: Secondary | ICD-10-CM | POA: Diagnosis not present

## 2023-11-20 DIAGNOSIS — I10 Essential (primary) hypertension: Secondary | ICD-10-CM

## 2023-11-20 NOTE — Patient Instructions (Addendum)
 Medication Instructions:  Your physician recommends that you continue on your current medications as directed. Please refer to the Current Medication list given to you today.  *If you need a refill on your cardiac medications before your next appointment, please call your pharmacy*   Follow-Up: At [facility], you and your health needs are our priority.  As part of our continuing mission to provide you with exceptional heart care, our providers are all part of one team.  This team includes your primary Cardiologist (physician) and Advanced Practice Providers or APPs (Physician Assistants and Nurse Practitioners) who all work together to provide you with the care you need, when you need it.  Your next appointment:   6 month(s)  Provider:   You will see one of the following Advanced Practice Providers on your designated Care Team:   Charlies Arthur, PA-C Michael Andy Tillery, PA-C Suzann Riddle, NP Daphne Barrack, NP

## 2023-11-20 NOTE — Progress Notes (Signed)
 Virtual Visit via Video Note  I connected with Paula Stein on 11/20/23 at  3:30 PM EDT by a video enabled telemedicine application and verified that I am speaking with the correct person using two identifiers.  Location: Patient: Home Provider: Office   I discussed the limitations of evaluation and management by telemedicine and the availability of in person appointments. The patient expressed understanding and agreed to proceed.  History of Present Illness: 78 year old female , active smoker, followed for complex sleep apnea.  Last seen May 2019, reestablished May 24, 2022.  Medical history significant for congestive heart failure, diabetes, hypothyroidism, Chronic kidney disease, hx of portal vein thrombosis (prev on xarelto -off 03/2022)  Previous [facility] pulmonary encounter: 05/24/2022 Sleep consult -complex sleep apnea Patient presents for a sleep consult today.  Patient is here to reestablish for complex sleep apnea.  Patient was last seen May 2019.  Has a history of complex sleep apnea previously on BiPAP.  Patient says she has been using her BiPAP every single night up until about 6 months ago.  Patient says she ran out of her BiPAP supplies and was unable to continue to use her device.  Patient says she moved and has not gotten new supplies at new address. .  She is here today to get restarted back onto her BiPAP.  Says she feels bad not wearing BIPAP, sleep is restless and feels tired. BIPAP machine is around 80yrs old, works great. Just needs new supplies . Patient says that it is hard to fall asleep and wakes up several times throughout the night.  Typically goes to bed about 11 PM.  Takes about 30 minutes to go to sleep.  Is up at 8 AM.  Last sleep study was June 2018 with AHI at 50.8/hour and SpO2 low at 86%.  Titration portion showed treatment emergent centrals.  BiPAP at 18 over 14 cm of H2O. Last visit noted patient had excellent compliance with daily average usage at 6.5 hours.   Previously on IPAP of 18, EPAP of 14 cm H2O.  An AHI was 6.1. Epworth score is 2 out of 24.  Typically gets sleepy if she sits down to watch TV and in the evening hours.  11/20/2023 - CPAP fu Discussed the use of AI scribe software for clinical note transcription with the patient, who gave verbal consent to proceed. History of Present Illness Paula Stein is an 78 year old female with severe sleep apnea who presents for follow-up on her condition.  She has a history of severe sleep apnea, diagnosed in June 2018, with a sleep study showing 50.8 apneic events per hour and a lowest oxygen saturation of 86%. A titration study was performed, and BiPAP therapy was started with pressures set at 18 over 14 cm H2O, which reduced her apneic events to 6.1 per hour.  She uses her BiPAP machine only twice a week due to issues with mask leakage and resultant dry mouth. Her last mask replacement was six months ago. No compliance download available.   TEST/EVENTS :  PSG 09/11/12 >> AHI 68.6 PSG 09/13/16 >> AHI 50.8, SpO2 low 86%.  Treatment emergent centrals.  Bipap 18/14 cm H2O  Echo 07/22/14 >> EF 30 to 35%, mod LVH, grade 1 DD, mild MR, mod RV systolic dysfx   Echo July 07, 2021 -EF 30 to 35%, grade 1 diastolic dysfunction, LV global hypokinesis, right ventricular systolic moderately decreased, pericardial effusion    Observations/Objective:  Unable to connect video visit  Assessment and Plan:  1. Complex sleep apnea syndrome (Primary) - Ambulatory Referral for DME  Assessment & Plan Severe obstructive sleep apnea with nonadherence to positive airway pressure therapy Severe obstructive sleep apnea diagnosed in June 2018 with 50.8 apneic events per hour and lowest oxygen saturation of 86%. BiPAP therapy was initiated with pressures of 18/14 cm H2O, reducing apneic events to 6.1 per hour. Currently, there is nonadherence to BiPAP therapy, with usage only twice a week due to mask leakage and dry mouth.  The mask cushion has not been replaced monthly as recommended, and the frame has not been replaced every three months.  - Order new CPAP supplies. - Recommend mask fitting at [facility] and provide SD card. - If insurance denies coverage due to nonadherence, consider repeat sleep study with in-lab split night.  Follow Up Instructions:   - Follow up in three months with data from SD card  I discussed the assessment and treatment plan with the patient. The patient was provided an opportunity to ask questions and all were answered. The patient agreed with the plan and demonstrated an understanding of the instructions.   The patient was advised to call back or seek an in-person evaluation if the symptoms worsen or if the condition fails to improve as anticipated.  I provided 22 minutes of non-face-to-face time during this encounter.   Almarie LELON Ferrari, NP

## 2023-11-21 ENCOUNTER — Telehealth: Payer: Self-pay

## 2023-11-21 NOTE — Telephone Encounter (Signed)
 Left message for patient to call back to schedule an appointment for 2 weeks with Gen [facility] APP

## 2023-12-05 NOTE — Telephone Encounter (Signed)
 Spoke with the patient and scheduled her for an appointment on 9/5

## 2023-12-06 ENCOUNTER — Telehealth: Payer: Self-pay

## 2023-12-06 NOTE — Progress Notes (Unsigned)
 Cardiology Office Note:  .   Date:  12/07/2023  ID:  Paula Stein, DOB 03/28/1946, MRN [id number] PCP: Mercer Clotilda JONELLE, MD  [facility] Providers Cardiologist:  Ozell Fell, MD Electrophysiologist:  Fonda Kitty, MD {  History of Present Illness: .   Paula Stein is a 78 y.o. female with a past medical history of nonobstructive CAD on cardiac catheterization in 2009, nonischemic cardiomyopathy/chronic combined CHF with a EF of 35% on last echo 08/2020 status post ICD for primary prevention, portal vein thrombosis on Xarelto , hypertension, hyperlipidemia, CKD stage III, OSA on CPAP, GERD here for follow-up appointment.  Was seen by EP 08/03/2022.  Patient reported feeling well at that visit.  Denied chest pain, palpitations, dyspnea, PND, orthopnea, nausea, vomiting, dizziness, syncope, edema, weight gain, early satiety.  I saw her back in March, she presents with a history of heart failure and diabetes, with concerns about the cost of her medications, Entresto  and Ozempic. She reports that the cost of these medications has increased significantly, making it difficult for her to afford. She has tried to apply for patient assistance and use coupons, but these efforts have been unsuccessful.  In addition to the medication cost concerns, the patient reports experiencing shortness of breath. She describes being able to walk, but needing to rest due to the breathlessness. She has adapted to this by pacing her activities, doing what she needs to do, then resting. She reports no swelling in her feet, no lightheadedness, dizziness, or abnormal heartbeats.  The patient also mentions a daily routine of walking around her neighborhood, which she finds beneficial. She expresses a positive outlook, refusing to let her symptoms get the best of her.  Reports no chest pain, pressure, or tightness. No edema, orthopnea, PND. Reports no palpitations.   Discussed the use of AI scribe software for clinical  note transcription with the patient, who gave verbal consent to proceed.  Today, she presents with a hx of hypertension and heart failure for follow-up of her cardiovascular conditions.  She experienced an elevated blood pressure reading today, attributed to not taking her medication before the appointment. Previous readings were 127/73 a few weeks ago and 142/76 in May. She has no headaches or lightheadedness. Shortness of breath is present, but there is no swelling in her feet or ankles. She is currently taking Entresto  and reports doing well on it. Her last echocardiogram was over two years ago, with a heart pump function of 30-35% before starting Entresto . Her diabetes management is going well, with an A1c of 5.9. She occasionally forgets to take her Ozempic on schedule. Her hemoglobin levels have returned to normal as of March.  Reports no shortness of breath nor dyspnea on exertion. Reports no chest pain, pressure, or tightness. No edema, orthopnea, PND. Reports no palpitations.   Discussed the use of AI scribe software for clinical note transcription with the patient, who gave verbal consent to proceed.   ROS: pertinent ROS in HPI  Studies Reviewed: .       Echocardiogram 07/07/21 IMPRESSIONS     1. Left ventricular ejection fraction, by estimation, is 30 to 35%. The  left ventricle has moderately decreased function. The left ventricle  demonstrates global hypokinesis. There is mild left ventricular  hypertrophy. Left ventricular diastolic  parameters are consistent with Grade I diastolic dysfunction (impaired  relaxation). Elevated left ventricular end-diastolic pressure. The E/e' is  15.   2. Right ventricular systolic function is moderately reduced. The right  ventricular  size is normal.   3. Left atrial size was mildly dilated.   4. The pericardial effusion is posterior to the left ventricle.   5. The mitral valve is abnormal. Trivial mitral valve regurgitation.   6. The  aortic valve is tricuspid. Aortic valve regurgitation is not  visualized.   Comparison(s): No significant change from prior study. 08/12/2020: LVEF  30-35%.   FINDINGS   Left Ventricle: Left ventricular ejection fraction, by estimation, is 30  to 35%. The left ventricle has moderately decreased function. The left  ventricle demonstrates global hypokinesis. The left ventricular internal  cavity size was normal in size.  There is mild left ventricular hypertrophy. Left ventricular diastolic  parameters are consistent with Grade I diastolic dysfunction (impaired  relaxation). Elevated left ventricular end-diastolic pressure. The E/e' is  15.   Right Ventricle: The right ventricular size is normal. No increase in  right ventricular wall thickness. Right ventricular systolic function is  moderately reduced.   Left Atrium: Left atrial size was mildly dilated.   Right Atrium: Right atrial size was normal in size.   Pericardium: Trivial pericardial effusion is present. The pericardial  effusion is posterior to the left ventricle.   Mitral Valve: The mitral valve is abnormal. There is mild thickening of  the anterior and posterior mitral valve leaflet(s). Trivial mitral valve  regurgitation.   Tricuspid Valve: The tricuspid valve is not well visualized. Tricuspid  valve regurgitation is not demonstrated.   Aortic Valve: The aortic valve is tricuspid. Aortic valve regurgitation is  not visualized. Aortic valve mean gradient measures 2.0 mmHg. Aortic valve  peak gradient measures 4.1 mmHg. Aortic valve area, by VTI measures 2.15  cm.   Pulmonic Valve: The pulmonic valve was not well visualized. Pulmonic valve  regurgitation is trivial.   Aorta: The aortic root and ascending aorta are structurally normal, with  no evidence of dilitation.   Venous: The inferior vena cava was not well visualized.   IAS/Shunts: No atrial level shunt detected by color flow Doppler.      Physical  Exam:   VS:  BP (!) 148/86 (BP Location: Left Arm, Patient Position: Sitting, Cuff Size: Normal)   Pulse 81   Ht 5' 10 (1.778 m)   Wt 233 lb (105.7 kg)   SpO2 99%   BMI 33.43 kg/m    Wt Readings from Last 3 Encounters:  12/07/23 233 lb (105.7 kg)  11/20/23 240 lb 4.8 oz (109 kg)  08/20/23 229 lb 3.2 oz (104 kg)    GEN: Well nourished, well developed in no acute distress NECK: No JVD; No carotid bruits CARDIAC: RRR, no murmurs, rubs, gallops RESPIRATORY:  Clear to auscultation without rales, wheezing or rhonchi  ABDOMEN: Soft, non-tender, non-distended EXTREMITIES:  No edema; No deformity   ASSESSMENT AND PLAN: .    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 Shortness of breath present, no peripheral edema. Last echocardiogram over two years ago showed heart pump function at 30-35%. Currently stable on Entresto  with no side effects.  -Euvolemic on exam, weight has been decreasing  - Order echocardiogram to assess current heart pump function. - Ensure refills for cardiac medications including carvedilol , Entresto , Lasix , Imdur , and spironolactone .  Type 2 diabetes mellitus Diabetes well-controlled with A1c of 5.9. Improved control attributed to Ozempic, which also aided weight loss. Medication cost covered by grant.  Gout Recent gout flare managed with medication.  Follow-Up - Schedule  follow-up with Dr. Wonda in six months. - Schedule echocardiogram appointment.  ICD -follow-up with Dr. Kennyth      Signed, Orren LOISE Fabry, PA-C

## 2023-12-06 NOTE — Telephone Encounter (Signed)
 Called patient and left VM to return call and sch an appt for Foot exam so Dr. Mercer can fill out paperwork for Diabetic shoes

## 2023-12-07 ENCOUNTER — Ambulatory Visit: Attending: Physician Assistant | Admitting: Physician Assistant

## 2023-12-07 VITALS — BP 148/86 | HR 81 | Ht 70.0 in | Wt 233.0 lb

## 2023-12-07 DIAGNOSIS — N184 Chronic kidney disease, stage 4 (severe): Secondary | ICD-10-CM

## 2023-12-07 DIAGNOSIS — Z95810 Presence of automatic (implantable) cardiac defibrillator: Secondary | ICD-10-CM

## 2023-12-07 DIAGNOSIS — I5020 Unspecified systolic (congestive) heart failure: Secondary | ICD-10-CM

## 2023-12-07 DIAGNOSIS — I5042 Chronic combined systolic (congestive) and diastolic (congestive) heart failure: Secondary | ICD-10-CM | POA: Diagnosis not present

## 2023-12-07 DIAGNOSIS — I428 Other cardiomyopathies: Secondary | ICD-10-CM | POA: Diagnosis not present

## 2023-12-07 DIAGNOSIS — E119 Type 2 diabetes mellitus without complications: Secondary | ICD-10-CM

## 2023-12-07 NOTE — Patient Instructions (Signed)
 Medication Instructions:  Your physician recommends that you continue on your current medications as directed. Please refer to the Current Medication list given to you today.  *If you need a refill on your cardiac medications before your next appointment, please call your pharmacy*   Testing/Procedures: Your physician has requested that you have an echocardiogram. Echocardiography is a painless test that uses sound waves to create images of your heart. It provides your doctor with information about the size and shape of your heart and how well your heart's chambers and valves are working. This procedure takes approximately one hour. There are no restrictions for this procedure. Please do NOT wear cologne, perfume, aftershave, or lotions (deodorant is allowed). Please arrive 15 minutes prior to your appointment time.  Please note: We ask at that you not bring children with you during ultrasound (echo/ vascular) testing. Due to room size and safety concerns, children are not allowed in the ultrasound rooms during exams. Our front office staff cannot provide observation of children in our lobby area while testing is being conducted. An adult accompanying a patient to their appointment will only be allowed in the ultrasound room at the discretion of the ultrasound technician under special circumstances. We apologize for any inconvenience.   Follow-Up: At [facility], you and your health needs are our priority.  As part of our continuing mission to provide you with exceptional heart care, our providers are all part of one team.  This team includes your primary Cardiologist (physician) and Advanced Practice Providers or APPs (Physician Assistants and Nurse Practitioners) who all work together to provide you with the care you need, when you need it.  Your next appointment:   6 month(s)  Provider:   Ozell Fell, MD

## 2023-12-14 ENCOUNTER — Ambulatory Visit: Payer: Self-pay

## 2023-12-14 ENCOUNTER — Emergency Department (HOSPITAL_COMMUNITY)

## 2023-12-14 ENCOUNTER — Encounter (HOSPITAL_COMMUNITY): Payer: Self-pay

## 2023-12-14 ENCOUNTER — Other Ambulatory Visit: Payer: Self-pay

## 2023-12-14 ENCOUNTER — Emergency Department (HOSPITAL_COMMUNITY)
Admission: EM | Admit: 2023-12-14 | Discharge: 2023-12-14 | Disposition: A | Discharge status: Home / Self Care | Attending: Emergency Medicine | Admitting: Emergency Medicine

## 2023-12-14 DIAGNOSIS — N184 Chronic kidney disease, stage 4 (severe): Secondary | ICD-10-CM | POA: Insufficient documentation

## 2023-12-14 DIAGNOSIS — I509 Heart failure, unspecified: Secondary | ICD-10-CM | POA: Diagnosis not present

## 2023-12-14 DIAGNOSIS — I5023 Acute on chronic systolic (congestive) heart failure: Secondary | ICD-10-CM | POA: Diagnosis not present

## 2023-12-14 DIAGNOSIS — F17210 Nicotine dependence, cigarettes, uncomplicated: Secondary | ICD-10-CM | POA: Diagnosis not present

## 2023-12-14 DIAGNOSIS — E039 Hypothyroidism, unspecified: Secondary | ICD-10-CM | POA: Insufficient documentation

## 2023-12-14 DIAGNOSIS — E1122 Type 2 diabetes mellitus with diabetic chronic kidney disease: Secondary | ICD-10-CM | POA: Diagnosis not present

## 2023-12-14 DIAGNOSIS — Z7984 Long term (current) use of oral hypoglycemic drugs: Secondary | ICD-10-CM | POA: Insufficient documentation

## 2023-12-14 DIAGNOSIS — Z95810 Presence of automatic (implantable) cardiac defibrillator: Secondary | ICD-10-CM | POA: Insufficient documentation

## 2023-12-14 DIAGNOSIS — I2510 Atherosclerotic heart disease of native coronary artery without angina pectoris: Secondary | ICD-10-CM | POA: Insufficient documentation

## 2023-12-14 DIAGNOSIS — I700 Atherosclerosis of aorta: Secondary | ICD-10-CM | POA: Insufficient documentation

## 2023-12-14 DIAGNOSIS — I428 Other cardiomyopathies: Secondary | ICD-10-CM | POA: Diagnosis not present

## 2023-12-14 DIAGNOSIS — J984 Other disorders of lung: Secondary | ICD-10-CM | POA: Diagnosis not present

## 2023-12-14 DIAGNOSIS — Z79890 Hormone replacement therapy (postmenopausal): Secondary | ICD-10-CM | POA: Insufficient documentation

## 2023-12-14 DIAGNOSIS — Z79899 Other long term (current) drug therapy: Secondary | ICD-10-CM | POA: Insufficient documentation

## 2023-12-14 DIAGNOSIS — R0602 Shortness of breath: Secondary | ICD-10-CM | POA: Diagnosis present

## 2023-12-14 DIAGNOSIS — H579 Unspecified disorder of eye and adnexa: Secondary | ICD-10-CM | POA: Diagnosis present

## 2023-12-14 DIAGNOSIS — R079 Chest pain, unspecified: Secondary | ICD-10-CM | POA: Diagnosis not present

## 2023-12-14 DIAGNOSIS — I110 Hypertensive heart disease with heart failure: Secondary | ICD-10-CM | POA: Diagnosis not present

## 2023-12-14 DIAGNOSIS — R918 Other nonspecific abnormal finding of lung field: Secondary | ICD-10-CM | POA: Diagnosis not present

## 2023-12-14 DIAGNOSIS — I130 Hypertensive heart and chronic kidney disease with heart failure and stage 1 through stage 4 chronic kidney disease, or unspecified chronic kidney disease: Secondary | ICD-10-CM | POA: Diagnosis not present

## 2023-12-14 DIAGNOSIS — I5043 Acute on chronic combined systolic (congestive) and diastolic (congestive) heart failure: Secondary | ICD-10-CM | POA: Insufficient documentation

## 2023-12-14 DIAGNOSIS — N183 Chronic kidney disease, stage 3 unspecified: Secondary | ICD-10-CM | POA: Insufficient documentation

## 2023-12-14 DIAGNOSIS — I272 Pulmonary hypertension, unspecified: Secondary | ICD-10-CM | POA: Diagnosis not present

## 2023-12-14 LAB — CBC
HCT: 37.9 % (ref 36.0–46.0)
Hemoglobin: 11.6 g/dL — ABNORMAL LOW (ref 12.0–15.0)
MCH: 31.5 pg (ref 26.0–34.0)
MCHC: 30.6 g/dL (ref 30.0–36.0)
MCV: 103 fL — ABNORMAL HIGH (ref 80.0–100.0)
Platelets: 257 K/uL (ref 150–400)
RBC: 3.68 MIL/uL — ABNORMAL LOW (ref 3.87–5.11)
RDW: 14.6 % (ref 11.5–15.5)
WBC: 7.4 K/uL (ref 4.0–10.5)
nRBC: 0 % (ref 0.0–0.2)

## 2023-12-14 LAB — I-STAT CHEM 8, ED
BUN: 18 mg/dL (ref 8–23)
Calcium, Ion: 1.2 mmol/L (ref 1.15–1.40)
Chloride: 112 mmol/L — ABNORMAL HIGH (ref 98–111)
Creatinine, Ser: 1.7 mg/dL — ABNORMAL HIGH (ref 0.44–1.00)
Glucose, Bld: 105 mg/dL — ABNORMAL HIGH (ref 70–99)
HCT: 37 % (ref 36.0–46.0)
Hemoglobin: 12.6 g/dL (ref 12.0–15.0)
Potassium: 4 mmol/L (ref 3.5–5.1)
Sodium: 141 mmol/L (ref 135–145)
TCO2: 21 mmol/L — ABNORMAL LOW (ref 22–32)

## 2023-12-14 LAB — BRAIN NATRIURETIC PEPTIDE: B Natriuretic Peptide: 169.6 pg/mL — ABNORMAL HIGH (ref 0.0–100.0)

## 2023-12-14 LAB — BASIC METABOLIC PANEL WITH GFR
Anion gap: 10 (ref 5–15)
BUN: 16 mg/dL (ref 8–23)
CO2: 21 mmol/L — ABNORMAL LOW (ref 22–32)
Calcium: 9.3 mg/dL (ref 8.9–10.3)
Chloride: 108 mmol/L (ref 98–111)
Creatinine, Ser: 1.69 mg/dL — ABNORMAL HIGH (ref 0.44–1.00)
GFR, Estimated: 30 mL/min — ABNORMAL LOW (ref >60)
Glucose, Bld: 107 mg/dL — ABNORMAL HIGH (ref 70–99)
Potassium: 3.9 mmol/L (ref 3.5–5.1)
Sodium: 139 mmol/L (ref 135–145)

## 2023-12-14 LAB — TROPONIN I (HIGH SENSITIVITY)
Troponin I (High Sensitivity): 23 ng/L — ABNORMAL HIGH (ref <18)
Troponin I (High Sensitivity): 21 ng/L — ABNORMAL HIGH (ref <18)

## 2023-12-14 LAB — RESP PANEL BY RT-PCR (RSV, FLU A&B, COVID)  RVPGX2
Influenza A by PCR: NEGATIVE
Influenza B by PCR: NEGATIVE
Resp Syncytial Virus by PCR: NEGATIVE
SARS Coronavirus 2 by RT PCR: NEGATIVE

## 2023-12-14 LAB — CK TOTAL AND CKMB (NOT AT ARMC)
CK, MB: 1.1 ng/mL (ref 0.5–5.0)
Total CK: 82 U/L (ref 38–234)

## 2023-12-14 MED ORDER — FUROSEMIDE 10 MG/ML IJ SOLN
60.0000 mg | Freq: Once | INTRAMUSCULAR | Status: AC
  Administered 2023-12-14: 60 mg via INTRAVENOUS
  Filled 2023-12-14: qty 6

## 2023-12-14 NOTE — Telephone Encounter (Signed)
 FYI Only or Action Required?: FYI only for provider.  Patient was last seen in primary care on 08/20/2023 by Paula Clotilda SAUNDERS, MD.  Called Nurse Triage reporting No chief complaint on file..  Symptoms began several days ago.  Interventions attempted: Rest, hydration, or home remedies.  Symptoms are: gradually worsening.  Triage Disposition: Go to ED Now (or PCP Triage)  Patient/caregiver understands and will follow disposition?: Yes  Copied from CRM #[id number]. Topic: Clinical - Red Word Triage >> Dec 14, 2023  8:14 AM Franky GRADE wrote: Red Word that prompted transfer to Nurse Triage: Patient is experiencing severe cold symptoms, head congestion,sore throat, difficulty breathing and blood pressure was elevated this morning 168/94.  Reason for Disposition  [1] Difficulty breathing AND [2] not severe  Answer Assessment - Initial Assessment Questions 1. ONSET: When did the throat start hurting? (Hours or days ago)      Tuesday  2. SEVERITY: How bad is the sore throat? (Scale 1-10; mild, moderate or severe)     Mild to Moderate  3. STREP EXPOSURE: Has there been any exposure to strep within the past week? If Yes, ask: What type of contact occurred?      Denies, No  4.  VIRAL SYMPTOMS: Are there any symptoms of a cold, such as a runny nose, cough, hoarse voice or red eyes?      Runny Nose, Cough  5. FEVER: Do you have a fever? If Yes, ask: What is your temperature, how was it measured, and when did it start?     Unsure  6. PUS ON THE TONSILS: Is there pus on the tonsils in the back of your throat?     No  7. OTHER SYMPTOMS: Do you have any other symptoms? (e.g., difficulty breathing, headache, rash)     Diarrhea, Difficulty Breathing (Dyspnea, Unable to lie flat, short breaths while triaging)  8. PREGNANCY: Is there any chance you are pregnant? When was your last menstrual period?     No and NO  Protocols used: Sore Throat-A-AH

## 2023-12-14 NOTE — Discharge Instructions (Addendum)
 While you are in the emergency room, you have blood work done and x-rays.  It showed that you had a little bit of extra fluid.  You received some Lasix  in the emergency room.  Over the next 3 days, I would like you to take 40 mg of Lasix  3 times per day.  This should help get rid of some extra fluid.  Return to the emergency room if you begin to feel more short of breath, develop fever, or pain in your chest.  Follow-up with your primary care doctor next week.

## 2023-12-14 NOTE — ED Triage Notes (Signed)
 Pt here from home with c/o chest pain and sob that has been off and on since wed , pt has been having to sleep sitting up more than usual

## 2023-12-14 NOTE — ED Provider Notes (Addendum)
 [place] EMERGENCY DEPARTMENT AT [facility] Provider Note  CSN: [id number] Arrival date & time: 12/14/23 9095  Chief Complaint(s) Chest Pain, Shortness of Breath, Nasal Congestion, and Eye Drainage  HPI Paula Stein is a 78 y.o. female here today with shortness of breath and orthopnea.  Patient has a history of heart failure, takes 40 mg of Lasix  2 times per day.  Has been compliant with her medications.  She has not had any fever, chills.  She says that she has occasionally had some pain in her chest, but primarily she has noted that she has felt more short of breath, need to use extra pillows to prop herself up at night.   Past Medical History Past Medical History:  Diagnosis Date   AICD (automatic cardioverter/defibrillator) present 10/08/2014   SUBQ    /    DR FERNANDE   Cataract    CHF (congestive heart failure) (HCC)    Diabetes mellitus without complication (HCC)    GERD (gastroesophageal reflux disease)    History of cardiac cath 05/05/2007   normal-with patent coronaries   History of colonoscopy    History of hiatal hernia    Hyperlipidemia    Hypertension    Kidney failure    Stage 3   Non-ischemic cardiomyopathy (HCC)    EF 28%- reassessment of LV function 2011 with LVEf 45-50%   PAD (peripheral artery disease) (HCC)    lower extremities with ABIs of 0.5 bilaterally   Personal history of goiter    S/P radioactive iodine thyroid  ablation    Sleep apnea    uses CPAP   Patient Active Problem List   Diagnosis Date Noted   History of colonic polyps    Cecal polyp    Kidney disease, chronic, stage IV (GFR 15-29 ml/min) (HCC) 07/07/2021   DM2 (diabetes mellitus, type 2) (HCC) 07/07/2021   Acute on chronic systolic (congestive) heart failure (HCC) 07/06/2021   Diverticulosis of colon without hemorrhage    Multiple polyps of sigmoid colon    Polyp of ascending colon    Polyp of descending colon    Rectal polyp    Lower abdominal pain    Coagulopathy  (HCC) 04/13/2020   Sigmoid diverticulitis 03/15/2020   Portal vein thrombosis 03/14/2020   HFrEF (heart failure with reduced ejection fraction) (HCC) 03/13/2018   Non-ischemic cardiomyopathy (HCC)    Hypertension    Diabetes mellitus without complication (HCC)    CAD (coronary artery disease) 06/18/2017   PAD (peripheral artery disease) (HCC) 06/18/2017   ICD (implantable cardioverter-defibrillator) in place 06/18/2017   Chronic combined systolic and diastolic CHF (congestive heart failure) (HCC) 05/22/2017   Hyperlipidemia associated with type 2 diabetes mellitus (HCC) 12/11/2016   Treatment-emergent central sleep apnea 09/20/2016   NICM (nonischemic cardiomyopathy) (HCC) 10/08/2014   AICD (automatic cardioverter/defibrillator) present 10/08/2014   Cigarette smoker 10/30/2012   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   De Quervain's disease (tenosynovitis) 09/01/2010   Atherosclerosis 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 Authorizing Provider  ACCU-CHEK GUIDE TEST test strip USE AS DIRECTED DAILY 10/22/23   Mercer Clotilda JONELLE, MD  Accu-Chek Softclix Lancets lancets USE TO CHECK BLOOD SUGAR DAILY 03/17/21   Mercer Clotilda JONELLE, MD  allopurinol  (ZYLOPRIM ) 100 MG tablet Take 1 tablet (  100 mg total) by mouth 2 (two) times daily. 07/16/23   McCaughan, Dia D, DPM  Blood Glucose Monitoring Suppl (ACCU-CHEK GUIDE ME) w/Device KIT CHECK BLOOD SUGAR DAILY 10/22/23   Mercer Clotilda SAUNDERS, MD  Calcium  Carb-Cholecalciferol (CALCIUM  600+D3 PO) Take 1 tablet by mouth daily.    [provider]  carvedilol  (COREG ) 25 MG tablet Take 1 tablet (25 mg total) by mouth 2 (two) times daily with a meal. 04/07/22   Mercer Clotilda SAUNDERS, MD  colchicine  0.6 MG tablet Take 1  tablet (0.6 mg total) by mouth 2 (two) times daily for 20 days. Take one tablet by mouth twice daily until gout symptoms resolve.  Then stop taking the medication 07/16/23 12/07/23  McCaughan, Dia D, DPM  furosemide  (LASIX ) 40 MG tablet TAKE 1 TABLET(40 MG) BY MOUTH TWICE DAILY 09/17/23   Mercer Clotilda SAUNDERS, MD  glipiZIDE  (GLUCOTROL  XL) 2.5 MG 24 hr tablet TAKE 1 TABLET(2.5 MG) BY MOUTH DAILY WITH BREAKFAST 09/17/23   Mercer Clotilda SAUNDERS, MD  hydrALAZINE  (APRESOLINE ) 25 MG tablet Take 25 mg by mouth 2 (two) times daily. 04/05/23   [provider]  isosorbide  mononitrate (IMDUR ) 30 MG 24 hr tablet Take 1 tablet (30 mg total) by mouth daily. 04/07/22   Mercer Clotilda SAUNDERS, MD  levothyroxine  (SYNTHROID ) 125 MCG tablet TAKE 1 TABLET BY MOUTH EVERY MORNING ON EMPTY STOMACH 30 MINUTES BEFORE OTHER MEDICATIONS OR BEFORE BREAKFAST 06/04/23   Mercer Clotilda SAUNDERS, MD  sacubitril -valsartan  (ENTRESTO ) 97-103 MG Take 1 tablet by mouth 2 (two) times daily. 09/14/23   Lucien Orren SAILOR, PA-C  Semaglutide,0.25 or 0.5MG /DOS, (OZEMPIC, 0.25 OR 0.5 MG/DOSE,) 2 MG/3ML SOPN Inject 0.5 mg into the skin once a week.    [provider]  spironolactone  (ALDACTONE ) 50 MG tablet Take 1 tablet (50 mg total) by mouth daily. 04/21/22   Mercer Clotilda SAUNDERS, MD  traZODone  (DESYREL ) 50 MG tablet Take 0.5-1 tablets (25-50 mg total) by mouth at bedtime as needed for sleep. 02/05/23   Mercer Clotilda SAUNDERS, MD                                                                                                                                    Past Surgical History Past Surgical History:  Procedure Laterality Date   BIOPSY  10/13/2020   Procedure: BIOPSY;  Surgeon: San Sandor GAILS, DO;  Location: [facility];  Service: Gastroenterology;;   BIOPSY  11/10/2021   Procedure: BIOPSY;  Surgeon: San Sandor GAILS, DO;  Location: [facility];  Service: Gastroenterology;;   CATARACT EXTRACTION Bilateral    COLONOSCOPY WITH PROPOFOL  N/A 10/13/2020   Procedure:  COLONOSCOPY WITH PROPOFOL ;  Surgeon: San Sandor GAILS, DO;  Location: [facility];  Service: Gastroenterology;  Laterality: N/A;   COLONOSCOPY WITH PROPOFOL  N/A 11/10/2021   Procedure: COLONOSCOPY WITH PROPOFOL ;  Surgeon: San Sandor GAILS, DO;  Location: [facility];  Service: Gastroenterology;  Laterality: N/A;  EP IMPLANTABLE DEVICE N/A 10/08/2014   Procedure: SubQ ICD Implant;  Surgeon: Elspeth JAYSON Sage, MD;  Location: [facility] INVASIVE CV LAB;  Service: Cardiovascular;  Laterality: N/A;   POLYPECTOMY  10/13/2020   Procedure: POLYPECTOMY;  Surgeon: San Sandor GAILS, DO;  Location: [facility];  Service: Gastroenterology;;   CATHY ICD CHANGEOUT N/A 11/14/2019   Procedure: DLAV ICD CHANGEOUT;  Surgeon: Sage Elspeth JAYSON, MD;  Location: [facility] INVASIVE CV LAB;  Service: Cardiovascular;  Laterality: N/A;   THYROIDECTOMY     TONSILLECTOMY     TOTAL ABDOMINAL HYSTERECTOMY     Family History Family History  Problem Relation Age of Onset   Diabetes type II Mother    Hypertension Mother    Diabetes Mother    Heart disease Mother    Other Father        deceased from accident age 29   Hyperlipidemia Father    Breast cancer Neg Hx    Stomach cancer Neg Hx    Colon cancer Neg Hx    Esophageal cancer Neg Hx    Pancreatic cancer Neg Hx     Social History Social History   Tobacco Use   Smoking status: Light Smoker    Types: Cigarettes   Smokeless tobacco: Never   Tobacco comments:    Smokes a pack per week.  05/24/2022 hfb  Vaping Use   Vaping status: Never Used  Substance Use Topics   Alcohol use: Not Currently    Comment: rare glass of wine   Drug use: No   Allergies Atorvastatin , Farxiga  [dapagliflozin ], Ibuprofen, and Jardiance  [empagliflozin ]  Review of Systems Review of Systems  Physical Exam Vital Signs  I have reviewed the triage vital signs BP (!) 165/69   Pulse 86   Temp (!) 97.2 F (36.2 C) (Axillary)   Resp 20   Ht 5' 10 (1.778 m)   Wt 100.7 kg   SpO2 100%   BMI  31.85 kg/m   Physical Exam Vitals and nursing note reviewed.  Cardiovascular:     Rate and Rhythm: Normal rate.     Heart sounds: Normal heart sounds.  Pulmonary:     Effort: Tachypnea present.     Breath sounds: Examination of the right-lower field reveals decreased breath sounds. Examination of the left-lower field reveals decreased breath sounds. Decreased breath sounds present. No wheezing.  Musculoskeletal:     Right lower leg: No edema.     Left lower leg: No edema.  Neurological:     Mental Status: She is alert.     ED Results and Treatments Labs (all labs ordered are listed, but only abnormal results are displayed) Labs Reviewed  BASIC METABOLIC PANEL WITH GFR - Abnormal; Notable for the following components:      Result Value   CO2 21 (*)    Glucose, Bld 107 (*)    Creatinine, Ser 1.69 (*)    GFR, Estimated 30 (*)    All other components within normal limits  CBC - Abnormal; Notable for the following components:   RBC 3.68 (*)    Hemoglobin 11.6 (*)    MCV 103.0 (*)    All other components within normal limits  I-STAT CHEM 8, ED - Abnormal; Notable for the following components:   Chloride 112 (*)    Creatinine, Ser 1.70 (*)    Glucose, Bld 105 (*)    TCO2 21 (*)    All other components within normal limits  TROPONIN I (HIGH SENSITIVITY) - Abnormal;  Notable for the following components:   Troponin I (High Sensitivity) 23 (*)    All other components within normal limits  TROPONIN I (HIGH SENSITIVITY) - Abnormal; Notable for the following components:   Troponin I (High Sensitivity) 21 (*)    All other components within normal limits  RESP PANEL BY RT-PCR (RSV, FLU A&B, COVID)  RVPGX2  CK TOTAL AND CKMB (NOT AT [facility])  BRAIN NATRIURETIC PEPTIDE                                                                                                                          Radiology DG Chest 2 View Result Date: 12/14/2023 CLINICAL DATA:  Chest pain.  Shortness of  breath and cough. EXAM: CHEST - 2 VIEW COMPARISON:  02/05/2023 FINDINGS: Subcutaneous defibrillator noted. Mild enlargement of the cardiopericardial silhouette with pulmonary vascular indistinctness compatible with pulmonary venous hypertension. Mild scarring at the right lung base. Trace blunting of the posterior costophrenic angles suspicious for small pleural effusions. Atherosclerotic calcification of the aortic arch. IMPRESSION: 1. Mild enlargement of the cardiopericardial silhouette with pulmonary venous hypertension and suspected small pleural effusions. 2. Mild scarring at the right lung base. 3. Subcutaneous defibrillator noted. 4.  Aortic Atherosclerosis (ICD10-I70.0). Electronically Signed   By: Ryan Salvage M.D.   On: 12/14/2023 10:19    Pertinent labs & imaging results that were available during my care of the patient were reviewed by me and considered in my medical decision making (see MDM for details).  Medications Ordered in ED Medications  furosemide  (LASIX ) injection 60 mg (60 mg Intravenous Given 12/14/23 1348)                                                                                                                                     Procedures Procedures  (including critical care time)  Medical Decision Making / ED Course   This patient presents to the ED for concern of shortness of breath, this involves an extensive number of treatment options, and is a complaint that carries with it a high risk of complications and morbidity.  The differential diagnosis includes fluid overload, less likely ACS, less likely PE, less likely pneumonia.  MDM: Patient mildly fluid overloaded.  Chest x-ray shows trace pleural effusions.  She is on room air, however is mildly tachypneic.  Patient nontoxic-appearing, pleasant, otherwise looks well.  Her troponin is at baseline.  Her kidney function is at baseline.  Believe we can diurese patient here in the ED, have her increase her  Lasix  over the next couple of days and see if there is improvement rather than admit for diuresis.  Discussed this with the patient, this was the plan that she preferred as well.  Reassessment 2:45 PM-patient is diuresed well.  She is feeling better.  She would like to go home.  Will discharge.   Additional history obtained:  -External records from outside source obtained and reviewed including: Chart review including previous notes, labs, imaging, consultation notes   Lab Tests: -I ordered, reviewed, and interpreted labs.   The pertinent results include:   Labs Reviewed  BASIC METABOLIC PANEL WITH GFR - Abnormal; Notable for the following components:      Result Value   CO2 21 (*)    Glucose, Bld 107 (*)    Creatinine, Ser 1.69 (*)    GFR, Estimated 30 (*)    All other components within normal limits  CBC - Abnormal; Notable for the following components:   RBC 3.68 (*)    Hemoglobin 11.6 (*)    MCV 103.0 (*)    All other components within normal limits  I-STAT CHEM 8, ED - Abnormal; Notable for the following components:   Chloride 112 (*)    Creatinine, Ser 1.70 (*)    Glucose, Bld 105 (*)    TCO2 21 (*)    All other components within normal limits  TROPONIN I (HIGH SENSITIVITY) - Abnormal; Notable for the following components:   Troponin I (High Sensitivity) 23 (*)    All other components within normal limits  TROPONIN I (HIGH SENSITIVITY) - Abnormal; Notable for the following components:   Troponin I (High Sensitivity) 21 (*)    All other components within normal limits  RESP PANEL BY RT-PCR (RSV, FLU A&B, COVID)  RVPGX2  CK TOTAL AND CKMB (NOT AT [facility])  BRAIN NATRIURETIC PEPTIDE      Imaging Studies ordered: I ordered imaging studies including chest x-ray I independently visualized and interpreted imaging. I agree with the radiologist interpretation   Medicines ordered and prescription drug management: Meds ordered this encounter  Medications   furosemide   (LASIX ) injection 60 mg    -I have reviewed the patients home medicines and have made adjustments as needed   Cardiac Monitoring: The patient was maintained on a cardiac monitor.  I personally viewed and interpreted the cardiac monitored which showed an underlying rhythm of: Normal sinus rhythm  Social Determinants of Health:  Factors impacting patients care include: Lack of access to primary care   Reevaluation: After the interventions noted above, I reevaluated the patient and found that they have :improved  Co morbidities that complicate the patient evaluation  Past Medical History:  Diagnosis Date   AICD (automatic cardioverter/defibrillator) present 10/08/2014   SUBQ    /    DR FERNANDE   Cataract    CHF (congestive heart failure) (HCC)    Diabetes mellitus without complication (HCC)    GERD (gastroesophageal reflux disease)    History of cardiac cath 05/05/2007   normal-with patent coronaries   History of colonoscopy    History of hiatal hernia    Hyperlipidemia    Hypertension    Kidney failure    Stage 3   Non-ischemic cardiomyopathy (HCC)    EF 28%- reassessment of LV function 2011 with LVEf 45-50%   PAD (peripheral artery disease) (HCC)    lower extremities  with ABIs of 0.5 bilaterally   Personal history of goiter    S/P radioactive iodine thyroid  ablation    Sleep apnea    uses CPAP        Final Clinical Impression(s) / ED Diagnoses Final diagnoses:  Acute on chronic congestive heart failure, unspecified heart failure type (HCC)     @PCDICTATION @    Mannie Pac T, DO 12/14/23 1449    Mannie Pac T, DO 12/14/23 1503

## 2023-12-14 NOTE — Telephone Encounter (Signed)
 Patient is being seen in the ED

## 2023-12-15 ENCOUNTER — Other Ambulatory Visit: Payer: Self-pay | Admitting: Family Medicine

## 2023-12-15 DIAGNOSIS — I10 Essential (primary) hypertension: Secondary | ICD-10-CM

## 2023-12-15 DIAGNOSIS — G4700 Insomnia, unspecified: Secondary | ICD-10-CM

## 2023-12-18 ENCOUNTER — Encounter: Payer: Self-pay | Admitting: Cardiology

## 2023-12-19 ENCOUNTER — Encounter: Payer: Self-pay | Admitting: Cardiology

## 2023-12-19 NOTE — Telephone Encounter (Signed)
 Called to speak to patient.  Patient states this morning she is doing better. Breathing is better. She has been taking the furosemide   for 3 days as  instructed by  the ER.   RN informed patient was calling to see if see still needs an appointment.  Patient decline stating she feels better now.

## 2023-12-25 ENCOUNTER — Other Ambulatory Visit: Payer: Self-pay

## 2023-12-25 DIAGNOSIS — I10 Essential (primary) hypertension: Secondary | ICD-10-CM

## 2023-12-25 MED ORDER — ISOSORBIDE MONONITRATE ER 30 MG PO TB24: 30.0000 mg | ORAL_TABLET | Freq: Every day | ORAL | 3 refills | Status: AC

## 2023-12-26 DIAGNOSIS — N184 Chronic kidney disease, stage 4 (severe): Secondary | ICD-10-CM | POA: Diagnosis not present

## 2023-12-28 ENCOUNTER — Ambulatory Visit (INDEPENDENT_AMBULATORY_CARE_PROVIDER_SITE_OTHER): Admitting: Adult Health

## 2023-12-28 VITALS — BP 130/80 | HR 91 | Temp 98.4°F | Ht 70.0 in | Wt 225.0 lb

## 2023-12-28 DIAGNOSIS — J020 Streptococcal pharyngitis: Secondary | ICD-10-CM

## 2023-12-28 DIAGNOSIS — H109 Unspecified conjunctivitis: Secondary | ICD-10-CM | POA: Diagnosis not present

## 2023-12-28 DIAGNOSIS — I5023 Acute on chronic systolic (congestive) heart failure: Secondary | ICD-10-CM

## 2023-12-28 DIAGNOSIS — B9689 Other specified bacterial agents as the cause of diseases classified elsewhere: Secondary | ICD-10-CM

## 2023-12-28 DIAGNOSIS — J069 Acute upper respiratory infection, unspecified: Secondary | ICD-10-CM

## 2023-12-28 LAB — POCT RAPID STREP A (OFFICE): Rapid Strep A Screen: POSITIVE — AB

## 2023-12-28 MED ORDER — AMOXICILLIN 875 MG PO TABS: 875.0000 mg | ORAL_TABLET | Freq: Two times a day (BID) | ORAL | 0 refills | Status: AC

## 2023-12-28 MED ORDER — ERYTHROMYCIN 5 MG/GM OP OINT: 1.0000 | TOPICAL_OINTMENT | Freq: Three times a day (TID) | OPHTHALMIC | 1 refills | Status: DC

## 2023-12-28 NOTE — Progress Notes (Signed)
 Subjective:    Patient ID: Paula Stein, female    DOB: [birth year], 78 y.o.   MRN: [id number]  HPI 78 year old female who  has a past medical history of AICD (automatic cardioverter/defibrillator) present (10/08/2014), Cataract, CHF (congestive heart failure) (HCC), Diabetes mellitus without complication (HCC), GERD (gastroesophageal reflux disease), History of cardiac cath (05/05/2007), History of colonoscopy, History of hiatal hernia, Hyperlipidemia, Hypertension, Kidney failure, Non-ischemic cardiomyopathy (HCC), PAD (peripheral artery disease), Personal history of goiter, S/P radioactive iodine thyroid  ablation, and Sleep apnea.  She is a patient of Dr. Mercer who I am seeing today for follow up after being seen in the ER two weeks ago.   Per ER notes she presented with shortness of breath and orthopnea.  She has a history of heart failure and takes 40 mg of Lasix  twice daily.  She has been compliant with her medications.  She had not had any fevers or chills.  She reported having occasional pain in her chest but primarily she noted that she had felt more short of breath having to use extra pillows to prop herself up at night.  On exam she was tachypneic and had decreased breath sounds in her right lower lung fields and left lower lung fields.  Her chest xray showed Mild enlargement of the cardiopericardial silhouette with pulmonary vascular indistinctness compatible with pulmonary venous hypertension. Mild scarring at the right lung base. Trace blunting of the posterior costophrenic angles suspicious for small pleural effusions. Atherosclerotic calcification of the aortic arch   Troponin was at baseline, kidney function at baseline.  She was diuresed in the ER and her Lasix  was decreased over a few days.  She reports today that with the increase in Lasix  that she feels much better in regards to the shortness of breath but while in the ER they did not address her URI like complaints (  Respiratory   She reports that for the last two weeks she has been experiencing white drainage from both her eyes, burning sensation in her eyes,  her eyes are matted shut when she wakes up, sinus pressure pain, nasal congestion and a sore throat.   Review of Systems See HPI   Past Medical History:  Diagnosis Date   AICD (automatic cardioverter/defibrillator) present 10/08/2014   SUBQ    /    DR FERNANDE   Cataract    CHF (congestive heart failure) (HCC)    Diabetes mellitus without complication (HCC)    GERD (gastroesophageal reflux disease)    History of cardiac cath 05/05/2007   normal-with patent coronaries   History of colonoscopy    History of hiatal hernia    Hyperlipidemia    Hypertension    Kidney failure    Stage 3   Non-ischemic cardiomyopathy (HCC)    EF 28%- reassessment of LV function 2011 with LVEf 45-50%   PAD (peripheral artery disease)    lower extremities with ABIs of 0.5 bilaterally   Personal history of goiter    S/P radioactive iodine thyroid  ablation    Sleep apnea    uses CPAP    Social History   Socioeconomic History   Marital status: Divorced    Spouse name: Not on file   Number of children: 1   Years of education: Not on file   Highest education level: Some college, no degree  Occupational History   Occupation: Retired    Associate Professor: RETIRED    Comment: Engineer, site  Tobacco Use   Smoking status:  Light Smoker    Types: Cigarettes   Smokeless tobacco: Never   Tobacco comments:    Smokes a pack per week.  05/24/2022 hfb  Vaping Use   Vaping status: Never Used  Substance and Sexual Activity   Alcohol use: Not Currently    Comment: rare glass of wine   Drug use: No   Sexual activity: Not Currently    Birth control/protection: None  Other Topics Concern   Not on file  Social History Narrative   Retired Engineer, site   Divorced - one grown son   current smoker    Alcohol use-no      Drug use-no    Regular exercise- no     son -  Paula Stein   Social Drivers of Health   Financial Resource Strain: Low Risk  (02/12/2023)   Overall Financial Resource Strain (CARDIA)    Difficulty of Paying Living Expenses: Not hard at all  Food Insecurity: No Food Insecurity (02/12/2023)   Hunger Vital Sign    Worried About Running Out of Food in the Last Year: Never true    Ran Out of Food in the Last Year: Never true  Transportation Needs: No Transportation Needs (02/12/2023)   PRAPARE - Administrator, Civil Service (Medical): No    Lack of Transportation (Non-Medical): No  Physical Activity: Insufficiently Active (02/12/2023)   Exercise Vital Sign    Days of Exercise per Week: 1 day    Minutes of Exercise per Session: 10 min  Stress: No Stress Concern Present (02/12/2023)   [facility]ula [facility] [employer] - Occupational Stress Questionnaire    Feeling of Stress : Not at all  Social Connections: Moderately Integrated (02/12/2023)   Social Connection and Isolation Panel    Frequency of Communication with Friends and Family: More than three times a week    Frequency of Social Gatherings with Friends and Family: More than three times a week    Attends Religious Services: More than 4 times per year    Active Member of Clubs or Organizations: Yes    Attends Banker Meetings: More than 4 times per year    Marital Status: Divorced  Intimate Partner Violence: Not At Risk (02/12/2023)   Humiliation, Afraid, Rape, and Kick questionnaire    Fear of Current or Ex-Partner: No    Emotionally Abused: No    Physically Abused: No    Sexually Abused: No    Past Surgical History:  Procedure Laterality Date   BIOPSY  10/13/2020   Procedure: BIOPSY;  Surgeon: San Sandor GAILS, DO;  Location: [facility];  Service: Gastroenterology;;   BIOPSY  11/10/2021   Procedure: BIOPSY;  Surgeon: San Sandor GAILS, DO;  Location: [facility];  Service: Gastroenterology;;   CATARACT EXTRACTION Bilateral     COLONOSCOPY WITH PROPOFOL  N/A 10/13/2020   Procedure: COLONOSCOPY WITH PROPOFOL ;  Surgeon: San Sandor GAILS, DO;  Location: [facility];  Service: Gastroenterology;  Laterality: N/A;   COLONOSCOPY WITH PROPOFOL  N/A 11/10/2021   Procedure: COLONOSCOPY WITH PROPOFOL ;  Surgeon: San Sandor GAILS, DO;  Location: [facility];  Service: Gastroenterology;  Laterality: N/A;   EP IMPLANTABLE DEVICE N/A 10/08/2014   Procedure: SubQ ICD Implant;  Surgeon: Elspeth JAYSON Sage, MD;  Location: [facility] INVASIVE CV LAB;  Service: Cardiovascular;  Laterality: N/A;   POLYPECTOMY  10/13/2020   Procedure: POLYPECTOMY;  Surgeon: San Sandor GAILS, DO;  Location: [facility];  Service: Gastroenterology;;   CATHY ICD CHANGEOUT N/A 11/14/2019  Procedure: SUBQ ICD CHANGEOUT;  Surgeon: Fernande Elspeth BROCKS, MD;  Location: [facility] INVASIVE CV LAB;  Service: Cardiovascular;  Laterality: N/A;   THYROIDECTOMY     TONSILLECTOMY     TOTAL ABDOMINAL HYSTERECTOMY      Family History  Problem Relation Age of Onset   Diabetes type II Mother    Hypertension Mother    Diabetes Mother    Heart disease Mother    Other Father        deceased from accident age 34   Hyperlipidemia Father    Breast cancer Neg Hx    Stomach cancer Neg Hx    Colon cancer Neg Hx    Esophageal cancer Neg Hx    Pancreatic cancer Neg Hx     Allergies  Allergen Reactions   Atorvastatin  Other (See Comments)    Cramping   Farxiga  [Dapagliflozin ] Rash   Ibuprofen Nausea Only   Jardiance  [Empagliflozin ] Itching    Current Outpatient Medications on File Prior to Visit  Medication Sig Dispense Refill   ACCU-CHEK GUIDE TEST test strip USE AS DIRECTED DAILY 100 strip 2   Accu-Chek Softclix Lancets lancets USE TO CHECK BLOOD SUGAR DAILY 100 each 1   allopurinol  (ZYLOPRIM ) 100 MG tablet Take 1 tablet (100 mg total) by mouth 2 (two) times daily. 90 tablet 3   Blood Glucose Monitoring Suppl (ACCU-CHEK GUIDE ME) w/Device KIT CHECK BLOOD SUGAR DAILY 1 kit 0   Calcium   Carb-Cholecalciferol (CALCIUM  600+D3 PO) Take 1 tablet by mouth daily.     carvedilol  (COREG ) 25 MG tablet Take 1 tablet (25 mg total) by mouth 2 (two) times daily with a meal. 180 tablet 3   colchicine  0.6 MG tablet Take 1 tablet (0.6 mg total) by mouth 2 (two) times daily for 20 days. Take one tablet by mouth twice daily until gout symptoms resolve.  Then stop taking the medication 40 tablet 0   furosemide  (LASIX ) 40 MG tablet TAKE 1 TABLET(40 MG) BY MOUTH TWICE DAILY 180 tablet 3   glipiZIDE  (GLUCOTROL  XL) 2.5 MG 24 hr tablet TAKE 1 TABLET(2.5 MG) BY MOUTH DAILY WITH BREAKFAST 90 tablet 0   hydrALAZINE  (APRESOLINE ) 25 MG tablet Take 25 mg by mouth 2 (two) times daily.     isosorbide  mononitrate (IMDUR ) 30 MG 24 hr tablet Take 1 tablet (30 mg total) by mouth daily. 90 tablet 3   levothyroxine  (SYNTHROID ) 125 MCG tablet TAKE 1 TABLET BY MOUTH EVERY MORNING ON EMPTY STOMACH 30 MINUTES BEFORE OTHER MEDICATIONS OR BEFORE BREAKFAST 90 tablet 1   sacubitril -valsartan  (ENTRESTO ) 97-103 MG Take 1 tablet by mouth 2 (two) times daily. 180 tablet 2   Semaglutide,0.25 or 0.5MG /DOS, (OZEMPIC, 0.25 OR 0.5 MG/DOSE,) 2 MG/3ML SOPN Inject 0.5 mg into the skin once a week.     spironolactone  (ALDACTONE ) 50 MG tablet Take 1 tablet (50 mg total) by mouth daily. 90 tablet 3   traZODone  (DESYREL ) 50 MG tablet TAKE 1/2 TO 1 TABLET(25 TO 50 MG) BY MOUTH AT BEDTIME AS NEEDED FOR SLEEP 30 tablet 1   No current facility-administered medications on file prior to visit.    BP 130/80   Pulse 91   Temp 98.4 F (36.9 C) (Oral)   Ht 5' 10 (1.778 m)   Wt 225 lb (102.1 kg)   SpO2 97%   BMI 32.28 kg/m       Objective:   Physical Exam Vitals and nursing note reviewed.  Constitutional:      Appearance: Normal appearance.  HENT:     Right Ear: Hearing, tympanic membrane, ear canal and external ear normal.     Left Ear: Hearing, tympanic membrane, ear canal and external ear normal.     Nose: Congestion and rhinorrhea  present.     Right Turbinates: Enlarged and swollen.     Left Turbinates: Enlarged and swollen.     Right Sinus: Maxillary sinus tenderness present.     Left Sinus: Maxillary sinus tenderness present.     Mouth/Throat:     Lips: Pink.     Mouth: Mucous membranes are moist.     Pharynx: Uvula midline. Oropharyngeal exudate and posterior oropharyngeal erythema present.     Tonsils: 0 on the right. 0 on the left.  Eyes:     Conjunctiva/sclera:     Right eye: Right conjunctiva is injected. Exudate present.     Left eye: Left conjunctiva is injected. Exudate present.  Cardiovascular:     Rate and Rhythm: Normal rate and regular rhythm.     Pulses: Normal pulses.     Heart sounds: Normal heart sounds.  Pulmonary:     Effort: Pulmonary effort is normal.     Breath sounds: Normal breath sounds.  Musculoskeletal:        General: Normal range of motion.  Skin:    General: Skin is warm and dry.  Neurological:     General: No focal deficit present.     Mental Status: She is alert and oriented to person, place, and time.  Psychiatric:        Mood and Affect: Mood normal.        Behavior: Behavior normal.        Thought Content: Thought content normal.        Judgment: Judgment normal.        Assessment & Plan:  1. Acute on chronic systolic (congestive) heart failure (HCC) - Lungs clear on exam and she appears euvolemic today.  - She feels back to baseline   2. Strep throat (Primary)  - POC Rapid Strep A- Positive  - amoxicillin  (AMOXIL ) 875 MG tablet; Take 1 tablet (875 mg total) by mouth 2 (two) times daily for 10 days.  Dispense: 20 tablet; Refill: 0  3. Upper respiratory tract infection, unspecified type  - amoxicillin  (AMOXIL ) 875 MG tablet; Take 1 tablet (875 mg total) by mouth 2 (two) times daily for 10 days.  Dispense: 20 tablet; Refill: 0  4. Bacterial conjunctivitis of both eyes  - erythromycin  ophthalmic ointment; Place 1 Application into both eyes 3 (three) times  daily.  Dispense: 3.5 g; Refill: 1   Darleene Shape, NP  I personally spent a total of 31 minutes in the care of the patient today including preparing to see the patient, getting/reviewing separately obtained history, performing a medically appropriate exam/evaluation, counseling and educating, placing orders, and documenting clinical information in the EHR.

## 2024-01-02 ENCOUNTER — Other Ambulatory Visit: Payer: Self-pay | Admitting: Podiatry

## 2024-01-03 DIAGNOSIS — E1122 Type 2 diabetes mellitus with diabetic chronic kidney disease: Secondary | ICD-10-CM | POA: Diagnosis not present

## 2024-01-03 DIAGNOSIS — E559 Vitamin D deficiency, unspecified: Secondary | ICD-10-CM | POA: Diagnosis not present

## 2024-01-03 DIAGNOSIS — I129 Hypertensive chronic kidney disease with stage 1 through stage 4 chronic kidney disease, or unspecified chronic kidney disease: Secondary | ICD-10-CM | POA: Diagnosis not present

## 2024-01-03 DIAGNOSIS — N184 Chronic kidney disease, stage 4 (severe): Secondary | ICD-10-CM | POA: Diagnosis not present

## 2024-01-03 DIAGNOSIS — N2581 Secondary hyperparathyroidism of renal origin: Secondary | ICD-10-CM | POA: Diagnosis not present

## 2024-01-03 NOTE — Progress Notes (Signed)
 Remote ICD Transmission

## 2024-01-12 ENCOUNTER — Other Ambulatory Visit: Payer: Self-pay | Admitting: Family Medicine

## 2024-01-16 ENCOUNTER — Ambulatory Visit (HOSPITAL_COMMUNITY)
Admission: RE | Admit: 2024-01-16 | Discharge: 2024-01-16 | Disposition: A | Discharge status: Home / Self Care | Source: Ambulatory Visit | Attending: Cardiovascular Disease | Admitting: Cardiovascular Disease

## 2024-01-16 DIAGNOSIS — I5020 Unspecified systolic (congestive) heart failure: Secondary | ICD-10-CM | POA: Insufficient documentation

## 2024-01-16 LAB — ECHOCARDIOGRAM COMPLETE
Area-P 1/2: 12.24 cm2
MV M vel: 5.25 m/s
MV Peak grad: 110.3 mmHg
S' Lateral: 3.8 cm

## 2024-01-16 MED ORDER — PERFLUTREN LIPID MICROSPHERE
1.0000 mL | INTRAVENOUS | Status: AC | PRN
  Administered 2024-01-16: 2 mL via INTRAVENOUS

## 2024-01-18 ENCOUNTER — Ambulatory Visit: Payer: Self-pay | Admitting: Physician Assistant

## 2024-01-25 ENCOUNTER — Ambulatory Visit (INDEPENDENT_AMBULATORY_CARE_PROVIDER_SITE_OTHER): Payer: Medicare HMO

## 2024-01-25 DIAGNOSIS — I5020 Unspecified systolic (congestive) heart failure: Secondary | ICD-10-CM

## 2024-01-26 LAB — CUP PACEART REMOTE DEVICE CHECK
Battery Remaining Percentage: 54 %
Date Time Interrogation Session: 20251024073800
HighPow Impedance: 60 Ohm
Implantable Lead Connection Status: 753985
Implantable Lead Implant Date: 20160707
Implantable Lead Location: 753862
Implantable Lead Model: 3401
Implantable Pulse Generator Implant Date: 20210813
Pulse Gen Serial Number: 143555

## 2024-01-28 ENCOUNTER — Ambulatory Visit: Payer: Self-pay | Admitting: Cardiology

## 2024-01-28 NOTE — Progress Notes (Signed)
 Remote ICD Transmission

## 2024-01-31 ENCOUNTER — Other Ambulatory Visit: Payer: Self-pay | Admitting: Podiatry

## 2024-01-31 DIAGNOSIS — M1A0710 Idiopathic chronic gout, right ankle and foot, without tophus (tophi): Secondary | ICD-10-CM

## 2024-02-13 ENCOUNTER — Encounter

## 2024-02-13 ENCOUNTER — Ambulatory Visit

## 2024-02-13 VITALS — Ht 70.0 in | Wt 225.0 lb

## 2024-02-13 DIAGNOSIS — Z0000 Encounter for general adult medical examination without abnormal findings: Secondary | ICD-10-CM | POA: Diagnosis not present

## 2024-02-13 NOTE — Patient Instructions (Addendum)
 Ms. Goldinger,  Thank you for taking the time for your Medicare Wellness Visit. I appreciate your continued commitment to your health goals. Please review the care plan we discussed, and feel free to reach out if I can assist you further.  Please note that Annual Wellness Visits do not include a physical exam. Some assessments may be limited, especially if the visit was conducted virtually. If needed, we may recommend an in-person follow-up with your provider.  Ongoing Care Seeing your primary care provider every 3 to 6 months helps us  monitor your health and provide consistent, personalized care.    Referrals If a referral was made during today's visit and you haven't received any updates within two weeks, please contact the referred provider directly to check on the status.  Recommended Screenings:  Health Maintenance  Topic Date Due   Eye exam for diabetics  06/12/2021   Complete foot exam   12/22/2022   COVID-19 Vaccine (7 - 2025-26 season) 12/03/2023   Flu Shot  07/01/2024*   Hemoglobin A1C  02/20/2024   Yearly kidney health urinalysis for diabetes  10/07/2024   Yearly kidney function blood test for diabetes  12/13/2024   Medicare Annual Wellness Visit  02/12/2025   DTaP/Tdap/Td vaccine (4 - Td or Tdap) 01/19/2031   Pneumococcal Vaccine for age over 13  Completed   DEXA scan (bone density measurement)  Completed   Zoster (Shingles) Vaccine  Completed   Meningitis B Vaccine  Aged Out   Colon Cancer Screening  Discontinued   Hepatitis C Screening  Discontinued  *Topic was postponed. The date shown is not the original due date.       02/13/2024    9:35 AM  Advanced Directives  Does Patient Have a Medical Advance Directive? Yes  Type of Estate Agent of [place];Living will  Does patient want to make changes to medical advance directive? No - Patient declined  Copy of Healthcare Power of Attorney in Chart? No - copy requested    Vision: Annual vision  screenings are recommended for early detection of glaucoma, cataracts, and diabetic retinopathy. These exams can also reveal signs of chronic conditions such as diabetes and high blood pressure.  Dental: Annual dental screenings help detect early signs of oral cancer, gum disease, and other conditions linked to overall health, including heart disease and diabetes.  Please see the attached documents for additional preventive care recommendations.

## 2024-02-13 NOTE — Progress Notes (Addendum)
 Chief Complaint  Patient presents with   Medicare Wellness     Subjective:   Paula Stein is a 78 y.o. female who presents for a Welcome to Medicare Exam.   Allergies (verified) Atorvastatin , Farxiga  [dapagliflozin ], Ibuprofen, and Jardiance  [empagliflozin ]   History: Past Medical History:  Diagnosis Date   AICD (automatic cardioverter/defibrillator) present 10/08/2014   SUBQ    /    DR FERNANDE   Cataract    CHF (congestive heart failure) (HCC)    Diabetes mellitus without complication (HCC)    GERD (gastroesophageal reflux disease)    History of cardiac cath 05/05/2007   normal-with patent coronaries   History of colonoscopy    History of hiatal hernia    Hyperlipidemia    Hypertension    Kidney failure    Stage 3   Non-ischemic cardiomyopathy (HCC)    EF 28%- reassessment of LV function 2011 with LVEf 45-50%   PAD (peripheral artery disease)    lower extremities with ABIs of 0.5 bilaterally   Personal history of goiter    S/P radioactive iodine thyroid  ablation    Sleep apnea    uses CPAP   Past Surgical History:  Procedure Laterality Date   BIOPSY  10/13/2020   Procedure: BIOPSY;  Surgeon: San Sandor GAILS, DO;  Location: [facility];  Service: Gastroenterology;;   BIOPSY  11/10/2021   Procedure: BIOPSY;  Surgeon: San Sandor GAILS, DO;  Location: [facility];  Service: Gastroenterology;;   CATARACT EXTRACTION Bilateral    COLONOSCOPY WITH PROPOFOL  N/A 10/13/2020   Procedure: COLONOSCOPY WITH PROPOFOL ;  Surgeon: San Sandor GAILS, DO;  Location: [facility];  Service: Gastroenterology;  Laterality: N/A;   COLONOSCOPY WITH PROPOFOL  N/A 11/10/2021   Procedure: COLONOSCOPY WITH PROPOFOL ;  Surgeon: San Sandor GAILS, DO;  Location: [facility];  Service: Gastroenterology;  Laterality: N/A;   EP IMPLANTABLE DEVICE N/A 10/08/2014   Procedure: SubQ ICD Implant;  Surgeon: Elspeth JAYSON Fernande, MD;  Location: [facility] INVASIVE CV LAB;  Service: Cardiovascular;  Laterality: N/A;    POLYPECTOMY  10/13/2020   Procedure: POLYPECTOMY;  Surgeon: San Sandor GAILS, DO;  Location: [facility];  Service: Gastroenterology;;   CATHY ICD CHANGEOUT N/A 11/14/2019   Procedure: DLAV ICD CHANGEOUT;  Surgeon: Fernande Elspeth JAYSON, MD;  Location: [facility] INVASIVE CV LAB;  Service: Cardiovascular;  Laterality: N/A;   THYROIDECTOMY     TONSILLECTOMY     TOTAL ABDOMINAL HYSTERECTOMY     Family History  Problem Relation Age of Onset   Diabetes type II Mother    Hypertension Mother    Diabetes Mother    Heart disease Mother    Other Father        deceased from accident age 76   Hyperlipidemia Father    Breast cancer Neg Hx    Stomach cancer Neg Hx    Colon cancer Neg Hx    Esophageal cancer Neg Hx    Pancreatic cancer Neg Hx    Social History   Occupational History   Occupation: Retired    Associate Professor: RETIRED    Comment: Engineer, site  Tobacco Use   Smoking status: Light Smoker    Types: Cigarettes   Smokeless tobacco: Never   Tobacco comments:    Smokes a pack per week.  05/24/2022 hfb  Vaping Use   Vaping status: Never Used  Substance and Sexual Activity   Alcohol use: Not Currently    Comment: rare glass of wine   Drug use: No  Sexual activity: Not Currently    Birth control/protection: None   Tobacco Counseling Ready to quit: Yes Counseling given: Yes Tobacco comments: Smokes a pack per week.  05/24/2022 hfb  SDOH Screenings   Food Insecurity: No Food Insecurity (02/13/2024)  Housing: Low Risk  (02/13/2024)  Transportation Needs: No Transportation Needs (02/13/2024)  Utilities: Not At Risk (02/13/2024)  Alcohol Screen: Low Risk  (02/09/2024)  Depression (PHQ2-9): Low Risk  (02/13/2024)  Financial Resource Strain: Low Risk  (02/09/2024)  Physical Activity: Inactive (02/13/2024)  Social Connections: Moderately Integrated (02/13/2024)  Stress: Stress Concern Present (02/13/2024)  Tobacco Use: High Risk (02/13/2024)  Health Literacy: Adequate Health Literacy  (02/13/2024)   Depression Screen    02/13/2024    9:33 AM 08/20/2023   10:42 AM 02/12/2023    9:35 AM 04/21/2022    9:41 AM 12/21/2021   10:15 AM 10/21/2021   10:26 AM 09/09/2021    8:53 AM  PHQ 2/9 Scores  PHQ - 2 Score 0 0 0 0 0 0 0  PHQ- 9 Score  3  3  0  2  0  3      Data saved with a previous flowsheet row definition     Goals Addressed               This Visit's Progress     Increase physical activity (pt-stated)        Remain active       Visit info / Clinical Intake: Medicare Wellness Visit Type:: Subsequent Annual Wellness Visit Persons participating in visit:: patient Medicare Wellness Visit Mode:: Telephone If telephone:: video declined If Telephone or Video please confirm:: I connected with the patient using audio enabled telemedicine application and verified that I am speaking with the correct person using two identifiers Patient Location:: Home Provider Location:: office Information given by:: patient Interpreter Needed?: No Pre-visit prep was completed: yes AWV questionnaire completed by patient prior to visit?: yes Date:: 02/09/24 Living arrangements:: with family/others Patient's Overall Health Status Rating: (!) fair Typical amount of pain: none Does pain affect daily life?: no Are you currently prescribed opioids?: no  Dietary Habits and Nutritional Risks How many meals a day?: 2 Eats fruit and vegetables daily?: yes Most meals are obtained by: preparing own meals In the last 2 weeks, have you had any of the following?: none Diabetic:: (!) yes Any non-healing wounds?: no How often do you check your BS?: 1 (Daily) Would you like to be referred to a Nutritionist or for Diabetic Management? : no  Functional Status Activities of Daily Living (to include ambulation/medication): Independent Ambulation: Independent Home Management: Independent Manage your own finances?: yes Primary transportation is: driving Concerns about vision?: no *vision  screening is required for [facility]* Concerns about hearing?: no  Fall Screening Falls in the past year?: 0 Number of falls in past year: 0 Was there an injury with Fall?: 0 Fall Risk Category Calculator: 0 Patient Fall Risk Level: Low Fall Risk  Fall Risk Patient at Risk for Falls Due to: No Fall Risks Fall risk Follow up: Falls evaluation completed  Home and Transportation Safety: All rugs have non-skid backing?: N/A, no rugs All stairs or steps have railings?: yes Grab bars in the bathtub or shower?: (!) no Have non-skid surface in bathtub or shower?: yes Good home lighting?: yes Regular seat belt use?: yes Hospital stays in the last year:: no  Cognitive Assessment Difficulty concentrating, remembering, or making decisions? : no Will 6CIT or Mini Cog be Completed:  no 6CIT or Mini Cog Declined: patient alert, oriented, able to answer questions appropriately and recall recent events  Advance Directives (For Healthcare) Does Patient Have a Medical Advance Directive?: Yes Does patient want to make changes to medical advance directive?: No - Patient declined Type of Advance Directive: Healthcare Power of [place]; Living will Copy of Healthcare Power of Attorney in Chart?: No - copy requested Copy of Living Will in Chart?: No - copy requested  Reviewed/Updated  Reviewed/Updated: Reviewed All (Medical, Surgical, Family, Medications, Allergies, Care Teams, Patient Goals)         Objective:    Today's Vitals   02/13/24 0925  Weight: 225 lb (102.1 kg)  Height: 5' 10 (1.778 m)   Body mass index is 32.28 kg/m.   Physical Exam   Current Medications (verified) Outpatient Encounter Medications as of 02/13/2024  Medication Sig   ACCU-CHEK GUIDE TEST test strip USE AS DIRECTED DAILY   Accu-Chek Softclix Lancets lancets USE TO CHECK BLOOD SUGAR DAILY   allopurinol  (ZYLOPRIM ) 100 MG tablet TAKE 1 TABLET(100 MG) BY MOUTH TWICE DAILY   Blood Glucose Monitoring Suppl (ACCU-CHEK  GUIDE ME) w/Device KIT CHECK BLOOD SUGAR DAILY   Calcium  Carb-Cholecalciferol (CALCIUM  600+D3 PO) Take 1 tablet by mouth daily.   carvedilol  (COREG ) 25 MG tablet TAKE 1 TABLET(25 MG) BY MOUTH TWICE DAILY WITH A MEAL   colchicine  0.6 MG tablet TAKE 1 TABLET BY MOUTH TWICE DAILY UNTIL GOUT SYMPTOMS RESOLVE THEN STOP TAKING THE MEDICATION   erythromycin  ophthalmic ointment Place 1 Application into both eyes 3 (three) times daily.   furosemide  (LASIX ) 40 MG tablet TAKE 1 TABLET(40 MG) BY MOUTH TWICE DAILY   glipiZIDE  (GLUCOTROL  XL) 2.5 MG 24 hr tablet TAKE 1 TABLET(2.5 MG) BY MOUTH DAILY WITH BREAKFAST   hydrALAZINE  (APRESOLINE ) 25 MG tablet Take 25 mg by mouth 2 (two) times daily.   isosorbide  mononitrate (IMDUR ) 30 MG 24 hr tablet Take 1 tablet (30 mg total) by mouth daily.   levothyroxine  (SYNTHROID ) 125 MCG tablet TAKE 1 TABLET BY MOUTH EVERY MORNING ON EMPTY STOMACH 30 MINUTES BEFORE OTHER MEDICATIONS OR BEFORE BREAKFAST   sacubitril -valsartan  (ENTRESTO ) 97-103 MG Take 1 tablet by mouth 2 (two) times daily.   Semaglutide,0.25 or 0.5MG /DOS, (OZEMPIC, 0.25 OR 0.5 MG/DOSE,) 2 MG/3ML SOPN Inject 0.5 mg into the skin once a week.   spironolactone  (ALDACTONE ) 50 MG tablet Take 1 tablet (50 mg total) by mouth daily.   traZODone  (DESYREL ) 50 MG tablet TAKE 1/2 TO 1 TABLET(25 TO 50 MG) BY MOUTH AT BEDTIME AS NEEDED FOR SLEEP   No facility-administered encounter medications on file as of 02/13/2024.   Hearing/Vision screen Hearing Screening - Comments:: Denies hearing difficulties   Vision Screening - Comments:: Wears rx glasses - up to date with routine eye exams with  Deferred Immunizations and Health Maintenance Health Maintenance  Topic Date Due   OPHTHALMOLOGY EXAM  06/12/2021   FOOT EXAM  12/22/2022   COVID-19 Vaccine (7 - 2025-26 season) 12/03/2023   Influenza Vaccine  07/01/2024 (Originally 11/02/2023)   HEMOGLOBIN A1C  02/20/2024   Diabetic kidney evaluation - Urine ACR  10/07/2024    Diabetic kidney evaluation - eGFR measurement  12/13/2024   Medicare Annual Wellness (AWV)  02/12/2025   DTaP/Tdap/Td (4 - Td or Tdap) 01/19/2031   Pneumococcal Vaccine: 50+ Years  Completed   DEXA SCAN  Completed   Zoster Vaccines- Shingrix  Completed   Meningococcal B Vaccine  Aged Out   Colonoscopy  Discontinued  Hepatitis C Screening  Discontinued         Assessment/Plan:  This is a routine wellness examination for Ziona.  Patient Care Team: Mercer Clotilda SAUNDERS, MD as PCP - General (Family Medicine) Wonda Sharper, MD as PCP - Cardiology (Cardiology) Kennyth Chew, MD as PCP - Electrophysiology (Cardiology) San Sandor GAILS, DO as Consulting Physician (Gastroenterology) Federico Norleen ONEIDA MADISON, MD as Consulting Physician (Hematology and Oncology) Carletha Hussar, OD (Optometry) [place], Jon DEL, [facility] (Pharmacist)  I have personally reviewed and noted the following in the patient's chart:   Medical and social history Use of alcohol, tobacco or illicit drugs  Current medications and supplements including opioid prescriptions. Functional ability and status Nutritional status Physical activity Advanced directives List of other physicians Hospitalizations, surgeries, and ER visits in previous 12 months Vitals Screenings to include cognitive, depression, and falls Referrals and appointments  No orders of the defined types were placed in this encounter.  In addition, I have reviewed and discussed with patient certain preventive protocols, quality metrics, and best practice recommendations. A written personalized care plan for preventive services as well as general preventive health recommendations were provided to patient.   Rojelio LELON Blush, LPN   [id number]   Return in 1 year on 02/18/2025

## 2024-02-20 ENCOUNTER — Ambulatory Visit: Admitting: Family Medicine

## 2024-02-20 ENCOUNTER — Encounter: Payer: Self-pay | Admitting: Family Medicine

## 2024-02-20 VITALS — BP 122/82 | HR 76 | Temp 98.3°F | Wt 229.2 lb

## 2024-02-20 DIAGNOSIS — E782 Mixed hyperlipidemia: Secondary | ICD-10-CM

## 2024-02-20 DIAGNOSIS — Z720 Tobacco use: Secondary | ICD-10-CM

## 2024-02-20 DIAGNOSIS — Z23 Encounter for immunization: Secondary | ICD-10-CM

## 2024-02-20 DIAGNOSIS — I10 Essential (primary) hypertension: Secondary | ICD-10-CM

## 2024-02-20 DIAGNOSIS — F17210 Nicotine dependence, cigarettes, uncomplicated: Secondary | ICD-10-CM | POA: Diagnosis not present

## 2024-02-20 DIAGNOSIS — N184 Chronic kidney disease, stage 4 (severe): Secondary | ICD-10-CM

## 2024-02-20 DIAGNOSIS — E1142 Type 2 diabetes mellitus with diabetic polyneuropathy: Secondary | ICD-10-CM

## 2024-02-20 DIAGNOSIS — I5020 Unspecified systolic (congestive) heart failure: Secondary | ICD-10-CM | POA: Diagnosis not present

## 2024-02-20 DIAGNOSIS — R0602 Shortness of breath: Secondary | ICD-10-CM

## 2024-02-20 LAB — COMPREHENSIVE METABOLIC PANEL WITH GFR
ALT: 6 U/L (ref 0–35)
AST: 9 U/L (ref 0–37)
Albumin: 3.7 g/dL (ref 3.5–5.2)
Alkaline Phosphatase: 74 U/L (ref 39–117)
BUN: 32 mg/dL — ABNORMAL HIGH (ref 6–23)
CO2: 27 meq/L (ref 19–32)
Calcium: 9.9 mg/dL (ref 8.4–10.5)
Chloride: 102 meq/L (ref 96–112)
Creatinine, Ser: 2.28 mg/dL — ABNORMAL HIGH (ref 0.40–1.20)
GFR: 19.79 mL/min — ABNORMAL LOW (ref >60.00)
Glucose, Bld: 84 mg/dL (ref 70–99)
Potassium: 4.7 meq/L (ref 3.5–5.1)
Sodium: 138 meq/L (ref 135–145)
Total Bilirubin: 0.5 mg/dL (ref 0.2–1.2)
Total Protein: 7.5 g/dL (ref 6.0–8.3)

## 2024-02-20 LAB — LIPID PANEL
Cholesterol: 308 mg/dL — ABNORMAL HIGH (ref 0–200)
HDL: 68.1 mg/dL (ref >39.00)
LDL Cholesterol: 202 mg/dL — ABNORMAL HIGH (ref 0–99)
NonHDL: 240.26
Total CHOL/HDL Ratio: 5
Triglycerides: 189 mg/dL — ABNORMAL HIGH (ref 0.0–149.0)
VLDL: 37.8 mg/dL (ref 0.0–40.0)

## 2024-02-20 LAB — BRAIN NATRIURETIC PEPTIDE: Pro B Natriuretic peptide (BNP): 42 pg/mL (ref 0.0–100.0)

## 2024-02-20 LAB — TSH: TSH: 157.75 u[IU]/mL — ABNORMAL HIGH (ref 0.35–5.50)

## 2024-02-20 MED ORDER — BREZTRI AEROSPHERE 160-9-4.8 MCG/ACT IN AERO: 2.0000 | INHALATION_SPRAY | Freq: Two times a day (BID) | RESPIRATORY_TRACT | 0 refills | Status: AC

## 2024-02-20 NOTE — Patient Instructions (Addendum)
 The Breztri inhaler you were given is used for COPD.  You can take 2 puffs twice a day to see if you notice improvement in your symptoms.  If it is helpful we can send in a prescription.

## 2024-02-20 NOTE — Progress Notes (Unsigned)
 Established Patient Office Visit   Subjective  Patient ID: Paula Stein, female    DOB: [birth year]  Age: 78 y.o. MRN: [id number]  Chief Complaint  Patient presents with   Follow-up    Patient came in for 6 month follow-up for diabetes,Bp, CKD, HFREF, seen in ED on 9/12 and stated she needs a cholesterol panel    Pt is a 78 yo female seen for f/u on chronic conditions.  Pt      Patient Active Problem List   Diagnosis Date Noted   History of colonic polyps    Cecal polyp    Kidney disease, chronic, stage IV (GFR 15-29 ml/min) (HCC) 07/07/2021   DM2 (diabetes mellitus, type 2) (HCC) 07/07/2021   Acute on chronic systolic (congestive) heart failure (HCC) 07/06/2021   Diverticulosis of colon without hemorrhage    Multiple polyps of sigmoid colon    Polyp of ascending colon    Polyp of descending colon    Rectal polyp    Lower abdominal pain    Coagulopathy 04/13/2020   Sigmoid diverticulitis 03/15/2020   Portal vein thrombosis 03/14/2020   HFrEF (heart failure with reduced ejection fraction) (HCC) 03/13/2018   Non-ischemic cardiomyopathy (HCC)    Hypertension    Diabetes mellitus without complication (HCC)    CAD (coronary artery disease) 06/18/2017   PAD (peripheral artery disease) 06/18/2017   ICD (implantable cardioverter-defibrillator) in place 06/18/2017   Chronic combined systolic and diastolic CHF (congestive heart failure) (HCC) 05/22/2017   Hyperlipidemia associated with type 2 diabetes mellitus (HCC) 12/11/2016   Treatment-emergent central sleep apnea 09/20/2016   NICM (nonischemic cardiomyopathy) (HCC) 10/08/2014   AICD (automatic cardioverter/defibrillator) present 10/08/2014   Cigarette smoker 10/30/2012   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   De Quervain's disease (tenosynovitis) 09/01/2010   Atherosclerosis 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 12/07/2008   Diabetes (HCC) 10/29/2008    Hypothyroidism 01/31/2007   Essential hypertension 01/31/2007   GERD 01/31/2007   Past Medical History:  Diagnosis Date   AICD (automatic cardioverter/defibrillator) present 10/08/2014   SUBQ    /    DR FERNANDE   Allergy    Arthritis 2023   Cataract    CHF (congestive heart failure) (HCC)    Diabetes mellitus without complication (HCC)    GERD (gastroesophageal reflux disease)    History of cardiac cath 05/05/2007   normal-with patent coronaries   History of colonoscopy    History of hiatal hernia    Hyperlipidemia    Hypertension    Kidney failure    Stage 3   Non-ischemic cardiomyopathy (HCC)    EF 28%- reassessment of LV function 2011 with LVEf 45-50%   PAD (peripheral artery disease)    lower extremities with ABIs of 0.5 bilaterally   Personal history of goiter    S/P radioactive iodine thyroid  ablation    Sleep apnea    uses CPAP   Thyroid  disease 2005   Past Surgical History:  Procedure Laterality Date   BIOPSY  10/13/2020   Procedure: BIOPSY;  Surgeon: San Sandor GAILS, DO;  Location: [facility];  Service: Gastroenterology;;   BIOPSY  11/10/2021   Procedure: BIOPSY;  Surgeon: San Sandor GAILS, DO;  Location: [facility];  Service: Gastroenterology;;   CATARACT EXTRACTION Bilateral    COLONOSCOPY WITH PROPOFOL  N/A 10/13/2020   Procedure: COLONOSCOPY WITH PROPOFOL ;  Surgeon: San Sandor GAILS, DO;  Location: [facility];  Service: Gastroenterology;  Laterality: N/A;   COLONOSCOPY WITH PROPOFOL  N/A  11/10/2021   Procedure: COLONOSCOPY WITH PROPOFOL ;  Surgeon: San Sandor GAILS, DO;  Location: [facility];  Service: Gastroenterology;  Laterality: N/A;   EP IMPLANTABLE DEVICE N/A 10/08/2014   Procedure: SubQ ICD Implant;  Surgeon: Elspeth JAYSON Sage, MD;  Location[facility]VAS[facility] CV LAB;  Service: Cardiovascular;  Laterality: N/A;   EYE SURGERY     POLYPECTOMY  10/13/2020   Procedure: POLYPECTOMY;  Surgeon: San Sandor GAILS, DO;   Location: [facility];  Service: Gastroenterology;;   CATHY ICD CHANGEOUT N/A 11/14/2019   Procedure: DLAV ICD CHANGEOUT;  Surgeon: Sage Elspeth JAYSON, MD;  Location: [facility] INVASIVE CV LAB;  Service: Cardiovascular;  Laterality: N/A;   THYROIDECTOMY     TONSILLECTOMY     TOTAL ABDOMINAL HYSTERECTOMY     Social History   Tobacco Use   Smoking status: Light Smoker    Types: Cigarettes   Smokeless tobacco: Never   Tobacco comments:    Smokes a pack per week.  05/24/2022 hfb  Vaping Use   Vaping status: Never Used  Substance Use Topics   Alcohol use: Not Currently    Comment: rare glass of wine   Drug use: No   Family History  Problem Relation Age of Onset   Diabetes type II Mother    Hypertension Mother    Diabetes Mother    Heart disease Mother    Arthritis Mother    Obesity Mother    Other Father        deceased from accident age 75   Hyperlipidemia Father    Breast cancer Neg Hx    Stomach cancer Neg Hx    Colon cancer Neg Hx    Esophageal cancer Neg Hx    Pancreatic cancer Neg Hx    Allergies  Allergen Reactions   Atorvastatin  Other (See Comments)    Cramping   Farxiga  [Dapagliflozin ] Rash   Ibuprofen Nausea Only   Jardiance  [Empagliflozin ] Itching    ROS Negative unless stated above    Objective:     BP 122/82 (BP Location: Left Arm, Patient Position: Sitting, Cuff Size: Large)   Pulse 76   Temp 98.3 F (36.8 C) (Oral)   Wt 229 lb 3.2 oz (104 kg)   SpO2 99%   BMI 32.89 kg/m  BP Readings from Last 3 Encounters:  02/20/24 122/82  01/16/24 (!) 180/99  12/28/23 130/80   Wt Readings from Last 3 Encounters:  02/20/24 229 lb 3.2 oz (104 kg)  02/13/24 225 lb (102.1 kg)  12/28/23 225 lb (102.1 kg)      Physical Exam     02/13/2024    9:33 AM 08/20/2023   10:42 AM 02/12/2023    9:35 AM  Depression screen PHQ 2/9  Decreased Interest 0 0 0  Down, Depressed, Hopeless 0 0 0  PHQ - 2 Score 0 0 0  Altered sleeping  1 0   Tired, decreased energy  0 2  Change in appetite  2 1  Feeling bad or failure about yourself   0 0  Trouble concentrating  0 0  Moving slowly or fidgety/restless  0 0  Suicidal thoughts  0 0  PHQ-9 Score  3  3   Difficult doing work/chores  Not difficult at all Not difficult at all     Data saved with a previous flowsheet row definition      08/20/2023   10:43 AM 08/21/2016   10:07 AM  GAD 7 : Generalized Anxiety Score  Nervous, Anxious, on  Edge 0 0  Control/stop worrying 0 0  Worry too much - different things 0 0  Trouble relaxing 0 2  Restless 0 0  Easily annoyed or irritable 0 0  Afraid - awful might happen 0 0  Total GAD 7 Score 0 2     No results found for any visits on 02/20/24.    Assessment & Plan:   Type 2 diabetes mellitus with diabetic polyneuropathy, without long-term current use of insulin  (HCC)  Essential hypertension  HFrEF (heart failure with reduced ejection fraction) (HCC)  Chronic kidney disease, stage 4 (severe) (HCC)  Encounter for diabetic foot exam (HCC)  Need for influenza vaccination   Breztri  inhaler sample given  No follow-ups on file.   Clotilda JONELLE Single, MD

## 2024-02-21 LAB — HEMOGLOBIN A1C: Hgb A1c MFr Bld: 6.4 % (ref 4.6–6.5)

## 2024-02-25 ENCOUNTER — Other Ambulatory Visit: Payer: Self-pay | Admitting: Family Medicine

## 2024-02-25 ENCOUNTER — Ambulatory Visit: Payer: Self-pay | Admitting: Family Medicine

## 2024-02-25 DIAGNOSIS — I10 Essential (primary) hypertension: Secondary | ICD-10-CM

## 2024-02-25 DIAGNOSIS — E039 Hypothyroidism, unspecified: Secondary | ICD-10-CM

## 2024-02-25 DIAGNOSIS — I5022 Chronic systolic (congestive) heart failure: Secondary | ICD-10-CM

## 2024-02-25 DIAGNOSIS — I739 Peripheral vascular disease, unspecified: Secondary | ICD-10-CM

## 2024-02-25 DIAGNOSIS — E782 Mixed hyperlipidemia: Secondary | ICD-10-CM

## 2024-02-25 MED ORDER — LEVOTHYROXINE SODIUM 125 MCG PO TABS: ORAL_TABLET | ORAL | 3 refills | Status: AC

## 2024-02-26 ENCOUNTER — Encounter (HOSPITAL_BASED_OUTPATIENT_CLINIC_OR_DEPARTMENT_OTHER): Payer: Self-pay | Admitting: *Deleted

## 2024-02-26 ENCOUNTER — Encounter (HOSPITAL_BASED_OUTPATIENT_CLINIC_OR_DEPARTMENT_OTHER): Payer: Self-pay | Admitting: Internal Medicine

## 2024-02-26 ENCOUNTER — Other Ambulatory Visit (HOSPITAL_COMMUNITY): Payer: Self-pay

## 2024-02-26 ENCOUNTER — Telehealth: Payer: Self-pay | Admitting: Pharmacy Technician

## 2024-02-26 ENCOUNTER — Ambulatory Visit (INDEPENDENT_AMBULATORY_CARE_PROVIDER_SITE_OTHER): Admitting: Internal Medicine

## 2024-02-26 ENCOUNTER — Telehealth: Payer: Self-pay

## 2024-02-26 VITALS — BP 132/58 | HR 76 | Ht 70.0 in | Wt 232.0 lb

## 2024-02-26 DIAGNOSIS — I428 Other cardiomyopathies: Secondary | ICD-10-CM

## 2024-02-26 DIAGNOSIS — I739 Peripheral vascular disease, unspecified: Secondary | ICD-10-CM | POA: Diagnosis not present

## 2024-02-26 DIAGNOSIS — I700 Atherosclerosis of aorta: Secondary | ICD-10-CM | POA: Diagnosis not present

## 2024-02-26 DIAGNOSIS — T466X5D Adverse effect of antihyperlipidemic and antiarteriosclerotic drugs, subsequent encounter: Secondary | ICD-10-CM

## 2024-02-26 DIAGNOSIS — E7849 Other hyperlipidemia: Secondary | ICD-10-CM | POA: Diagnosis not present

## 2024-02-26 DIAGNOSIS — M791 Myalgia, unspecified site: Secondary | ICD-10-CM | POA: Diagnosis not present

## 2024-02-26 MED ORDER — REPATHA SURECLICK 140 MG/ML ~~LOC~~ SOAJ: 140.0000 mg | SUBCUTANEOUS | 3 refills | Status: AC

## 2024-02-26 NOTE — Patient Instructions (Signed)
 Medication Instructions:  Dr. Mona recommends Repatha (PCSK9). This is an injectable cholesterol medication self-administered once every 14 days. This medication will likely need prior approval with your insurance company, which we will work on. If the medication is not approved initially, we may need to do an appeal with your insurance. If approved, we will provide you with copay and cost information. We'll then send the prescription to your pharmacy. We would have you complete another set of fasting labs between 3-4 months to reassess cholesterol.   Repatha is self-injected once every 14 days in subcutaneous or fatty tissue - such as belly or side/outer/upper thigh. It is best stored in the refrigerator but is stable at room temp up to 28 days. Please take the pen-injector out of fridge about 30 minutes - 1 hour prior to injection, to allow it to warm closer to room temperature.   This medication is very effective in lowering LDL and can lower LPa, as Dr. Mona mentioned. It is also generally well tolerated -- most common reaction may be cold-like symptoms such as runny nose, scratchy throat, as this is an antibody therapy. It is generally self-limiting and after a few doses, your body should have normalized to the medication.   Here is a demo video: [email or web address].[email or web address]   If you need a co-pay card for Repatha: lawsponsor.fr  Patient Assistance:    These foundations have funds at various times.   The [employer]: https://www.panfoundation.org/disease-funds/hypercholesterolemia/ -- can sign up for wait list  [facility] offers assistance to help pay for medication copays.  They will cover copays for all cholesterol lowering meds, including statins, fibrates, omega-3 fish oils like Vascepa, ezetimibe, Repatha, Praluent, Nexletol, Nexlizet.  The cards are usually good for $2,500 or 12 months, whichever comes first. Our fax #  is [phone] (you will need this to apply) Go to healthwellfoundation.org Click on "Apply Now" Answer questions as to whom is applying (patient or representative) Your disease fund will be "hypercholesterolemia - Medicare access" They will ask questions about finances and which medications you are taking for cholesterol When you submit, the approval is usually within minutes.  You will need to print the card information from the site You will need to show this information to your pharmacy, they will bill your Medicare Part D plan first -then bill [employer] [facility] --for the copay.   You can also call them at [phone], although the hold times can be quite long.    *If you need a refill on your cardiac medications before your next appointment, please call your pharmacy*  Lab Work: Your physician recommends that you return for lab work in: 3 months   NMR Lipoprofile and Lp(a)  If you have labs (blood work) drawn today and your tests are completely normal, you will receive your results only by: MyChart Message (if you have MyChart) OR A paper copy in the mail If you have any lab test that is abnormal or we need to change your treatment, we will call you to review the results.  Testing/Procedures: none  Follow-Up: As needed

## 2024-02-26 NOTE — Telephone Encounter (Signed)
PA cancelled.

## 2024-02-26 NOTE — Telephone Encounter (Signed)
 Pharmacy Patient Advocate Encounter  Received notification from [employer] that Prior Authorization for REPATHA  has been APPROVED from 04/04/23 to 04/02/25

## 2024-02-26 NOTE — Telephone Encounter (Signed)
 Pharmacy Patient Advocate Encounter   Received notification from Physician's Office that prior authorization for repatha  is required/requested.   Insurance verification completed.   The patient is insured through [employer].   Per test claim: PA required; PA submitted to above mentioned insurance via Latent Key/confirmation #/EOC [id number] Status is pending

## 2024-02-26 NOTE — Progress Notes (Signed)
 LIPID CLINIC CONSULT NOTE  Chief Complaint:  Manage dyslipidemia  Primary Care Physician: Mercer Clotilda SAUNDERS, MD  Primary Cardiologist:  Ozell Fell, MD  HPI:  Paula Stein is a 78 y.o. female who is being seen today for the evaluation of dyslipidemia at the request of Mercer Clotilda SAUNDERS, MD. this is a pleasant 78 year old female, referred for evaluation and management of dyslipidemia.  She has been a patient of Dr. Fell and Dr. Fernande for some time with a nonischemic cardiomyopathy having had heart catheterization in 2009 which showed minimal coronary disease.  She does have a history of high cholesterol and unfortunately has been statin intolerant.  Recent labs showed total cholesterol 308, triglycerides 189, HDL 68 and LDL 202, suggestive of possible familial hyperlipidemia.  She had a low EF in the past but it has improved up to 40 to 45% on therapy and is status post AICD.  She was noted on a recent echo to have heavy mitral and aortic annular calcification and has a history of PAD as well.  PMHx:  Past Medical History:  Diagnosis Date   AICD (automatic cardioverter/defibrillator) present 10/08/2014   SUBQ    /    DR FERNANDE   Allergy    Arthritis 2023   Cataract    CHF (congestive heart failure) (HCC)    Diabetes mellitus without complication (HCC)    GERD (gastroesophageal reflux disease)    History of cardiac cath 05/05/2007   normal-with patent coronaries   History of colonoscopy    History of hiatal hernia    Hyperlipidemia    Hypertension    Kidney failure    Stage 3   Non-ischemic cardiomyopathy (HCC)    EF 28%- reassessment of LV function 2011 with LVEf 45-50%   PAD (peripheral artery disease)    lower extremities with ABIs of 0.5 bilaterally   Personal history of goiter    S/P radioactive iodine thyroid  ablation    Sleep apnea    uses CPAP   Thyroid  disease 2005    Past Surgical History:  Procedure Laterality Date   BIOPSY  10/13/2020   Procedure:  BIOPSY;  Surgeon: San Sandor GAILS, DO;  Location: [facility];  Service: Gastroenterology;;   BIOPSY  11/10/2021   Procedure: BIOPSY;  Surgeon: San Sandor GAILS, DO;  Location: [facility];  Service: Gastroenterology;;   CATARACT EXTRACTION Bilateral    COLONOSCOPY WITH PROPOFOL  N/A 10/13/2020   Procedure: COLONOSCOPY WITH PROPOFOL ;  Surgeon: San Sandor GAILS, DO;  Location: [facility];  Service: Gastroenterology;  Laterality: N/A;   COLONOSCOPY WITH PROPOFOL  N/A 11/10/2021   Procedure: COLONOSCOPY WITH PROPOFOL ;  Surgeon: San Sandor GAILS, DO;  Location: [facility];  Service: Gastroenterology;  Laterality: N/A;   EP IMPLANTABLE DEVICE N/A 10/08/2014   Procedure: SubQ ICD Implant;  Surgeon: Elspeth JAYSON Fernande, MD;  Location: [facility] INVASIVE CV LAB;  Service: Cardiovascular;  Laterality: N/A;   EYE SURGERY     POLYPECTOMY  10/13/2020   Procedure: POLYPECTOMY;  Surgeon: San Sandor GAILS, DO;  Location: [facility];  Service: Gastroenterology;;   CATHY ICD CHANGEOUT N/A 11/14/2019   Procedure: DLAV ICD CHANGEOUT;  Surgeon: Fernande Elspeth JAYSON, MD;  Location: [facility] INVASIVE CV LAB;  Service: Cardiovascular;  Laterality: N/A;   THYROIDECTOMY     TONSILLECTOMY     TOTAL ABDOMINAL HYSTERECTOMY      FAMHx:  Family History  Problem Relation Age of Onset   Diabetes type II Mother    Hypertension Mother  Diabetes Mother    Heart disease Mother    Arthritis Mother    Obesity Mother    Other Father        deceased from accident age 55   Hyperlipidemia Father    Breast cancer Neg Hx    Stomach cancer Neg Hx    Colon cancer Neg Hx    Esophageal cancer Neg Hx    Pancreatic cancer Neg Hx     SOCHx:   reports that she has been smoking cigarettes. She has never used smokeless tobacco. She reports that she does not currently use alcohol. She reports that she does not use drugs.  ALLERGIES:  Allergies  Allergen Reactions   Atorvastatin  Other (See Comments)    Cramping   Farxiga   [Dapagliflozin ] Rash   Ibuprofen Nausea Only   Jardiance  [Empagliflozin ] Itching    ROS: Pertinent items noted in HPI and remainder of comprehensive ROS otherwise negative.  HOME MEDS: Current Outpatient Medications on File Prior to Visit  Medication Sig Dispense Refill   ACCU-CHEK GUIDE TEST test strip USE AS DIRECTED DAILY 100 strip 2   Accu-Chek Softclix Lancets lancets USE TO CHECK BLOOD SUGAR DAILY 100 each 1   allopurinol  (ZYLOPRIM ) 100 MG tablet TAKE 1 TABLET(100 MG) BY MOUTH TWICE DAILY 90 tablet 3   Blood Glucose Monitoring Suppl (ACCU-CHEK GUIDE ME) w/Device KIT CHECK BLOOD SUGAR DAILY 1 kit 0   budesonide-glycopyrrolate -formoterol  (BREZTRI  AEROSPHERE) 160-9-4.8 MCG/ACT AERO inhaler Inhale 2 puffs into the lungs 2 (two) times daily. Sample from clinic  0   Calcium  Carb-Cholecalciferol (CALCIUM  600+D3 PO) Take 1 tablet by mouth daily.     carvedilol  (COREG ) 25 MG tablet TAKE 1 TABLET(25 MG) BY MOUTH TWICE DAILY WITH A MEAL 180 tablet 3   colchicine  0.6 MG tablet TAKE 1 TABLET BY MOUTH TWICE DAILY UNTIL GOUT SYMPTOMS RESOLVE THEN STOP TAKING THE MEDICATION 40 tablet 0   furosemide  (LASIX ) 40 MG tablet TAKE 1 TABLET(40 MG) BY MOUTH TWICE DAILY 180 tablet 3   glipiZIDE  (GLUCOTROL  XL) 2.5 MG 24 hr tablet TAKE 1 TABLET(2.5 MG) BY MOUTH DAILY WITH BREAKFAST 90 tablet 0   hydrALAZINE  (APRESOLINE ) 25 MG tablet Take 25 mg by mouth 2 (two) times daily.     isosorbide  mononitrate (IMDUR ) 30 MG 24 hr tablet Take 1 tablet (30 mg total) by mouth daily. 90 tablet 3   levothyroxine  (SYNTHROID ) 125 MCG tablet TAKE 1 TABLET BY MOUTH EVERY MORNING ON EMPTY STOMACH 30 MINUTES BEFORE OTHER MEDICATIONS OR BEFORE BREAKFAST 90 tablet 3   sacubitril -valsartan  (ENTRESTO ) 97-103 MG Take 1 tablet by mouth 2 (two) times daily. 180 tablet 2   Semaglutide,0.25 or 0.5MG /DOS, (OZEMPIC, 0.25 OR 0.5 MG/DOSE,) 2 MG/3ML SOPN Inject 1 mg into the skin once a week.     spironolactone  (ALDACTONE ) 50 MG tablet TAKE 1  TABLET(50 MG) BY MOUTH DAILY 90 tablet 3   traZODone  (DESYREL ) 50 MG tablet TAKE 1/2 TO 1 TABLET(25 TO 50 MG) BY MOUTH AT BEDTIME AS NEEDED FOR SLEEP 30 tablet 1   erythromycin  ophthalmic ointment Place 1 Application into both eyes 3 (three) times daily. (Patient not taking: Reported on 02/26/2024) 3.5 g 1   No current facility-administered medications on file prior to visit.    LABS/IMAGING: No results found for this or any previous visit (from the past 48 hours). No results found.  LIPID PANEL:    Component Value Date/Time   CHOL 308 (H) 02/20/2024 1431   CHOL 228 (H) 02/06/2019 1052  TRIG 189.0 (H) 02/20/2024 1431   HDL 68.10 02/20/2024 1431   HDL 91 02/06/2019 1052   CHOLHDL 5 02/20/2024 1431   VLDL 37.8 02/20/2024 1431   LDLCALC 202 (H) 02/20/2024 1431   LDLCALC 109 (H) 02/06/2019 1052   LDLDIRECT 195.2 02/15/2011 1625    No results found for: LIPOA   WEIGHTS: Wt Readings from Last 3 Encounters:  02/26/24 232 lb (105.2 kg)  02/20/24 229 lb 3.2 oz (104 kg)  02/13/24 225 lb (102.1 kg)    VITALS: BP (!) 132/58   Pulse 76   Ht 5' 10 (1.778 m)   Wt 232 lb (105.2 kg)   SpO2 97%   BMI 33.29 kg/m   EXAM: Deferred  EKG: Deferred  ASSESSMENT: Probable familial hyperlipidemia, LDL greater than 190 Statin intolerant-myalgias PAD Presumed nonischemic cardiomyopathy, LVEF 40 to 45% Status post AICD Aortic annular and mitral annular calcification  PLAN: 1.   Ms. Paganelli has a probable familial hyperlipidemia but has been intolerant to statins.  Recent LDL was 202.  She is a good candidate for PCSK9 inhibitor and will reach out for prior authorization for Repatha .  Plan repeat lipids including NMR and LP(a) in about 3 months.  She may need additional therapies beyond that such as ezetimibe.  Thanks again for the kind referral.  Vinie KYM Maxcy, MD, [facility], FNLA, FACP  [facility]  Medical Director of the [facility] Lipid [facility] &   Cardiovascular Risk Reduction Clinic Diplomate of the American Board of Clinical Lipidology Attending Cardiologist  Direct Dial: [phone]  Fax: [phone]  Website:  [email or web address] Vinie BROCKS Abdelaziz Westenberger 02/26/2024, 12:06 PM

## 2024-03-14 NOTE — Progress Notes (Signed)
 Patient is in office today for a nurse visit for Influenza Immunization. Patient Injection was given in the  Left deltoid. Patient tolerated injection well.

## 2024-03-20 ENCOUNTER — Institutional Professional Consult (permissible substitution) (HOSPITAL_BASED_OUTPATIENT_CLINIC_OR_DEPARTMENT_OTHER): Admitting: Internal Medicine

## 2024-04-25 ENCOUNTER — Ambulatory Visit: Payer: Medicare HMO

## 2024-04-25 DIAGNOSIS — I428 Other cardiomyopathies: Secondary | ICD-10-CM | POA: Diagnosis not present

## 2024-04-28 ENCOUNTER — Ambulatory Visit: Payer: Self-pay | Admitting: Cardiology

## 2024-04-28 LAB — CUP PACEART REMOTE DEVICE CHECK
Battery Remaining Percentage: 51 %
Date Time Interrogation Session: 20260123085700
HighPow Impedance: 70 Ohm
Implantable Lead Connection Status: 753985
Implantable Lead Implant Date: 20160707
Implantable Lead Location: 753862
Implantable Lead Model: 3401
Implantable Pulse Generator Implant Date: 20210813
Pulse Gen Serial Number: 143555

## 2024-04-30 NOTE — Progress Notes (Signed)
 Remote ICD Transmission

## 2024-05-03 LAB — LAB REPORT - SCANNED: EGFR: 25

## 2024-07-25 ENCOUNTER — Ambulatory Visit: Payer: Medicare HMO

## 2025-02-18 ENCOUNTER — Ambulatory Visit
